# Patient Record
Sex: Female | Born: 1940
Health system: Southern US, Community
[De-identification: ages and names within clinical notes are randomized; demographics above are authoritative.]

## PROBLEM LIST (undated history)

## (undated) DIAGNOSIS — M19071 Primary osteoarthritis, right ankle and foot: Secondary | ICD-10-CM

## (undated) DIAGNOSIS — F329 Major depressive disorder, single episode, unspecified: Secondary | ICD-10-CM

## (undated) DIAGNOSIS — M51369 Other intervertebral disc degeneration, lumbar region without mention of lumbar back pain or lower extremity pain: Secondary | ICD-10-CM

## (undated) DIAGNOSIS — M545 Low back pain, unspecified: Secondary | ICD-10-CM

## (undated) DIAGNOSIS — R000 Tachycardia, unspecified: Secondary | ICD-10-CM

## (undated) DIAGNOSIS — R0600 Dyspnea, unspecified: Secondary | ICD-10-CM

## (undated) DIAGNOSIS — Z98811 Dental restoration status: Secondary | ICD-10-CM

## (undated) DIAGNOSIS — I4510 Unspecified right bundle-branch block: Secondary | ICD-10-CM

## (undated) DIAGNOSIS — Z9181 History of falling: Secondary | ICD-10-CM

## (undated) DIAGNOSIS — T4145XA Adverse effect of unspecified anesthetic, initial encounter: Secondary | ICD-10-CM

## (undated) DIAGNOSIS — G8929 Other chronic pain: Secondary | ICD-10-CM

## (undated) DIAGNOSIS — F419 Anxiety disorder, unspecified: Secondary | ICD-10-CM

## (undated) DIAGNOSIS — E669 Obesity, unspecified: Secondary | ICD-10-CM

## (undated) DIAGNOSIS — M1990 Unspecified osteoarthritis, unspecified site: Secondary | ICD-10-CM

## (undated) DIAGNOSIS — Z9889 Other specified postprocedural states: Secondary | ICD-10-CM

## (undated) DIAGNOSIS — M5136 Other intervertebral disc degeneration, lumbar region: Secondary | ICD-10-CM

## (undated) DIAGNOSIS — R112 Nausea with vomiting, unspecified: Secondary | ICD-10-CM

## (undated) DIAGNOSIS — J42 Unspecified chronic bronchitis: Secondary | ICD-10-CM

## (undated) DIAGNOSIS — K219 Gastro-esophageal reflux disease without esophagitis: Secondary | ICD-10-CM

## (undated) DIAGNOSIS — M19072 Primary osteoarthritis, left ankle and foot: Secondary | ICD-10-CM

## (undated) DIAGNOSIS — E871 Hypo-osmolality and hyponatremia: Secondary | ICD-10-CM

## (undated) DIAGNOSIS — D649 Anemia, unspecified: Secondary | ICD-10-CM

## (undated) DIAGNOSIS — I4891 Unspecified atrial fibrillation: Secondary | ICD-10-CM

## (undated) DIAGNOSIS — M169 Osteoarthritis of hip, unspecified: Secondary | ICD-10-CM

## (undated) DIAGNOSIS — Z96653 Presence of artificial knee joint, bilateral: Secondary | ICD-10-CM

## (undated) DIAGNOSIS — G4700 Insomnia, unspecified: Secondary | ICD-10-CM

## (undated) DIAGNOSIS — M1710 Unilateral primary osteoarthritis, unspecified knee: Secondary | ICD-10-CM

## (undated) DIAGNOSIS — E559 Vitamin D deficiency, unspecified: Secondary | ICD-10-CM

## (undated) DIAGNOSIS — N189 Chronic kidney disease, unspecified: Secondary | ICD-10-CM

## (undated) DIAGNOSIS — I10 Essential (primary) hypertension: Secondary | ICD-10-CM

## (undated) DIAGNOSIS — C50919 Malignant neoplasm of unspecified site of unspecified female breast: Secondary | ICD-10-CM

## (undated) DIAGNOSIS — D62 Acute posthemorrhagic anemia: Secondary | ICD-10-CM

## (undated) DIAGNOSIS — T8859XA Other complications of anesthesia, initial encounter: Secondary | ICD-10-CM

## (undated) DIAGNOSIS — H3530 Unspecified macular degeneration: Secondary | ICD-10-CM

## (undated) DIAGNOSIS — M19042 Primary osteoarthritis, left hand: Secondary | ICD-10-CM

## (undated) DIAGNOSIS — M19041 Primary osteoarthritis, right hand: Secondary | ICD-10-CM

## (undated) DIAGNOSIS — F32A Depression, unspecified: Secondary | ICD-10-CM

## (undated) DIAGNOSIS — M069 Rheumatoid arthritis, unspecified: Secondary | ICD-10-CM

## (undated) DIAGNOSIS — I499 Cardiac arrhythmia, unspecified: Secondary | ICD-10-CM

## (undated) HISTORY — DX: Primary osteoarthritis, left hand: M19.042

## (undated) HISTORY — DX: Unspecified atrial fibrillation: I48.91

## (undated) HISTORY — DX: Primary osteoarthritis, right hand: M19.041

## (undated) HISTORY — PX: ABDOMINAL HYSTERECTOMY: SHX81

## (undated) HISTORY — DX: Unspecified right bundle-branch block: I45.10

## (undated) HISTORY — PX: KNEE ARTHROSCOPY: SHX127

## (undated) HISTORY — PX: TOTAL ABDOMINAL HYSTERECTOMY W/ BILATERAL SALPINGOOPHORECTOMY: SHX83

## (undated) HISTORY — DX: Unspecified osteoarthritis, unspecified site: M19.90

## (undated) HISTORY — PX: JOINT REPLACEMENT: SHX530

## (undated) HISTORY — DX: Chronic kidney disease, unspecified: N18.9

## (undated) HISTORY — DX: Primary osteoarthritis, left ankle and foot: M19.072

## (undated) HISTORY — PX: WISDOM TOOTH EXTRACTION: SHX21

## (undated) HISTORY — PX: EYE SURGERY: SHX253

## (undated) HISTORY — PX: COLONOSCOPY: SHX174

## (undated) HISTORY — DX: Primary osteoarthritis, right ankle and foot: M19.071

---
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
## 1971-09-18 HISTORY — PX: TUBAL LIGATION: SHX77

## 1998-01-28 ENCOUNTER — Ambulatory Visit (HOSPITAL_COMMUNITY): Admission: RE | Admit: 1998-01-28 | Discharge: 1998-01-28 | Payer: Self-pay | Admitting: Internal Medicine

## 1999-05-26 ENCOUNTER — Ambulatory Visit (HOSPITAL_COMMUNITY): Admission: RE | Admit: 1999-05-26 | Discharge: 1999-05-26 | Payer: Self-pay | Admitting: Internal Medicine

## 1999-05-26 ENCOUNTER — Encounter: Payer: Self-pay | Admitting: Internal Medicine

## 2000-12-16 ENCOUNTER — Ambulatory Visit (HOSPITAL_COMMUNITY): Admission: RE | Admit: 2000-12-16 | Discharge: 2000-12-16 | Payer: Self-pay | Admitting: Internal Medicine

## 2000-12-16 ENCOUNTER — Encounter: Payer: Self-pay | Admitting: Internal Medicine

## 2000-12-17 ENCOUNTER — Ambulatory Visit (HOSPITAL_COMMUNITY): Admission: RE | Admit: 2000-12-17 | Discharge: 2000-12-17 | Payer: Self-pay | Admitting: Gastroenterology

## 2000-12-17 ENCOUNTER — Encounter (INDEPENDENT_AMBULATORY_CARE_PROVIDER_SITE_OTHER): Payer: Self-pay | Admitting: *Deleted

## 2000-12-18 ENCOUNTER — Encounter: Admission: RE | Admit: 2000-12-18 | Discharge: 2000-12-18 | Payer: Self-pay | Admitting: Internal Medicine

## 2000-12-18 ENCOUNTER — Encounter: Payer: Self-pay | Admitting: Internal Medicine

## 2002-06-19 ENCOUNTER — Ambulatory Visit (HOSPITAL_COMMUNITY): Admission: RE | Admit: 2002-06-19 | Discharge: 2002-06-19 | Payer: Self-pay | Admitting: Internal Medicine

## 2002-06-19 ENCOUNTER — Encounter: Payer: Self-pay | Admitting: Internal Medicine

## 2003-09-30 ENCOUNTER — Ambulatory Visit (HOSPITAL_COMMUNITY): Admission: RE | Admit: 2003-09-30 | Discharge: 2003-09-30 | Payer: Self-pay | Admitting: Internal Medicine

## 2004-04-21 ENCOUNTER — Other Ambulatory Visit: Admission: RE | Admit: 2004-04-21 | Discharge: 2004-04-21 | Payer: Self-pay | Admitting: Internal Medicine

## 2005-03-02 ENCOUNTER — Ambulatory Visit (HOSPITAL_COMMUNITY): Admission: RE | Admit: 2005-03-02 | Discharge: 2005-03-02 | Payer: Self-pay | Admitting: Internal Medicine

## 2006-06-21 ENCOUNTER — Ambulatory Visit (HOSPITAL_COMMUNITY): Admission: RE | Admit: 2006-06-21 | Discharge: 2006-06-21 | Payer: Self-pay | Admitting: Internal Medicine

## 2007-08-20 ENCOUNTER — Ambulatory Visit (HOSPITAL_COMMUNITY): Admission: RE | Admit: 2007-08-20 | Discharge: 2007-08-20 | Payer: Self-pay | Admitting: Internal Medicine

## 2008-01-29 ENCOUNTER — Encounter: Admission: RE | Admit: 2008-01-29 | Discharge: 2008-01-29 | Payer: Self-pay | Admitting: Orthopedic Surgery

## 2009-01-14 ENCOUNTER — Ambulatory Visit (HOSPITAL_COMMUNITY): Admission: RE | Admit: 2009-01-14 | Discharge: 2009-01-14 | Payer: Self-pay | Admitting: Internal Medicine

## 2010-05-30 ENCOUNTER — Ambulatory Visit (HOSPITAL_COMMUNITY): Admission: RE | Admit: 2010-05-30 | Discharge: 2010-05-30 | Payer: Self-pay | Admitting: Internal Medicine

## 2011-02-02 NOTE — Procedures (Signed)
[place]. [facility]  Patient:    Debra Griffith, Debra Griffith                        MRN: [id number] Proc. Date: 12/17/00 Attending:  Verlin Grills, M.D. CC:         Modesta Messing, M.D.                           Procedure Report  REFERRING PHYSICIAN:  Modesta Messing, M.D.  PROCEDURE:  Screening colonoscopy.  PROCEDURE INDICATION:  Debra Griffith (date of birth [birth year]) is a 70 year old female who is due for her first screening colonoscopy with polypectomy to prevent colon cancer.  I discussed with Debra Griffith the complications associated with colonoscopy and polypectomy, including intestinal bleeding and intestinal perforation.  Debra Griffith has signed the operative permit.  ENDOSCOPIST:  Verlin Grills, M.D.  PREMEDICATION:  Fentanyl 50 mcg, Versed 8 mg.  ENDOSCOPE:  Olympus pediatric colonoscope.  DESCRIPTION OF PROCEDURE:  After obtaining informed consent, the patient was placed in the left lateral decubitus position.  I administered intravenous fentanyl and intravenous Versed to achieve conscious sedation for the procedure.  The patients blood pressure, oxygen saturation, and cardiac rhythm were monitored throughout the procedure and documented in the medical record.  Anal inspection was normal.  Digital rectal exam was normal.  The Olympus pediatric video colonoscope was introduced into the rectum and under direct vision advanced to the cecum, as identified by a normal-appearing ileocecal valve.  Colonic preparation for the exam today was excellent.  Rectum normal.  Sigmoid colon and descending colon normal.  Splenic flexure normal.  Transverse colon normal.  Hepatic flexure normal.  Ascending colon:  In the mid-ascending colon, there is a 1 cm submucosal tumor consistent with a lipoma.  Using the closed biopsy forceps and palpating the submucosal tumor, it demonstrated the pillow sign.  Multiple biopsies  were taken, and I could see visible fat exuding through the biopsy site.  Cecum and ileocecal valve normal.  ASSESSMENT:  Except for the presence of a 1 cm submucosal lipoma in the ascending colon, proctocolonoscopy to the cecum was normal.  There was no endoscopic evidence for the presence of colorectal neoplasia.  RECOMMENDATIONS:  Repeat colonoscopy in approximately 10 years. DD:  12/17/00 TD:  12/17/00 Job: [id number]

## 2011-08-20 ENCOUNTER — Other Ambulatory Visit (HOSPITAL_COMMUNITY): Payer: Self-pay | Admitting: Internal Medicine

## 2011-08-20 DIAGNOSIS — Z1231 Encounter for screening mammogram for malignant neoplasm of breast: Secondary | ICD-10-CM

## 2011-09-24 ENCOUNTER — Ambulatory Visit (HOSPITAL_COMMUNITY)
Admission: RE | Admit: 2011-09-24 | Discharge: 2011-09-24 | Disposition: A | Discharge status: Home / Self Care | Payer: Medicare Other | Source: Ambulatory Visit | Attending: Internal Medicine | Admitting: Internal Medicine

## 2011-09-24 DIAGNOSIS — Z1231 Encounter for screening mammogram for malignant neoplasm of breast: Secondary | ICD-10-CM | POA: Insufficient documentation

## 2011-09-24 DIAGNOSIS — M5137 Other intervertebral disc degeneration, lumbosacral region: Secondary | ICD-10-CM | POA: Diagnosis not present

## 2011-09-24 DIAGNOSIS — M999 Biomechanical lesion, unspecified: Secondary | ICD-10-CM | POA: Diagnosis not present

## 2011-10-02 DIAGNOSIS — I10 Essential (primary) hypertension: Secondary | ICD-10-CM | POA: Diagnosis not present

## 2011-10-02 DIAGNOSIS — E559 Vitamin D deficiency, unspecified: Secondary | ICD-10-CM | POA: Diagnosis not present

## 2011-10-02 DIAGNOSIS — E782 Mixed hyperlipidemia: Secondary | ICD-10-CM | POA: Diagnosis not present

## 2011-10-02 DIAGNOSIS — G4700 Insomnia, unspecified: Secondary | ICD-10-CM | POA: Diagnosis not present

## 2011-10-02 DIAGNOSIS — M1990 Unspecified osteoarthritis, unspecified site: Secondary | ICD-10-CM | POA: Diagnosis not present

## 2011-10-03 DIAGNOSIS — M999 Biomechanical lesion, unspecified: Secondary | ICD-10-CM | POA: Diagnosis not present

## 2011-10-03 DIAGNOSIS — M5137 Other intervertebral disc degeneration, lumbosacral region: Secondary | ICD-10-CM | POA: Diagnosis not present

## 2011-10-18 DIAGNOSIS — M5137 Other intervertebral disc degeneration, lumbosacral region: Secondary | ICD-10-CM | POA: Diagnosis not present

## 2011-10-18 DIAGNOSIS — M999 Biomechanical lesion, unspecified: Secondary | ICD-10-CM | POA: Diagnosis not present

## 2011-10-24 DIAGNOSIS — M999 Biomechanical lesion, unspecified: Secondary | ICD-10-CM | POA: Diagnosis not present

## 2011-10-24 DIAGNOSIS — M5137 Other intervertebral disc degeneration, lumbosacral region: Secondary | ICD-10-CM | POA: Diagnosis not present

## 2011-10-29 DIAGNOSIS — M1710 Unilateral primary osteoarthritis, unspecified knee: Secondary | ICD-10-CM | POA: Diagnosis not present

## 2011-10-31 DIAGNOSIS — M999 Biomechanical lesion, unspecified: Secondary | ICD-10-CM | POA: Diagnosis not present

## 2011-10-31 DIAGNOSIS — M5137 Other intervertebral disc degeneration, lumbosacral region: Secondary | ICD-10-CM | POA: Diagnosis not present

## 2011-11-06 DIAGNOSIS — M5137 Other intervertebral disc degeneration, lumbosacral region: Secondary | ICD-10-CM | POA: Diagnosis not present

## 2011-11-06 DIAGNOSIS — M999 Biomechanical lesion, unspecified: Secondary | ICD-10-CM | POA: Diagnosis not present

## 2011-11-21 DIAGNOSIS — M999 Biomechanical lesion, unspecified: Secondary | ICD-10-CM | POA: Diagnosis not present

## 2011-11-21 DIAGNOSIS — M5137 Other intervertebral disc degeneration, lumbosacral region: Secondary | ICD-10-CM | POA: Diagnosis not present

## 2011-11-24 DIAGNOSIS — M999 Biomechanical lesion, unspecified: Secondary | ICD-10-CM | POA: Diagnosis not present

## 2011-11-24 DIAGNOSIS — M5137 Other intervertebral disc degeneration, lumbosacral region: Secondary | ICD-10-CM | POA: Diagnosis not present

## 2011-11-28 DIAGNOSIS — M5137 Other intervertebral disc degeneration, lumbosacral region: Secondary | ICD-10-CM | POA: Diagnosis not present

## 2011-11-28 DIAGNOSIS — M999 Biomechanical lesion, unspecified: Secondary | ICD-10-CM | POA: Diagnosis not present

## 2011-11-30 DIAGNOSIS — M1710 Unilateral primary osteoarthritis, unspecified knee: Secondary | ICD-10-CM | POA: Diagnosis not present

## 2011-12-05 DIAGNOSIS — M5137 Other intervertebral disc degeneration, lumbosacral region: Secondary | ICD-10-CM | POA: Diagnosis not present

## 2011-12-05 DIAGNOSIS — M999 Biomechanical lesion, unspecified: Secondary | ICD-10-CM | POA: Diagnosis not present

## 2011-12-07 DIAGNOSIS — M1710 Unilateral primary osteoarthritis, unspecified knee: Secondary | ICD-10-CM | POA: Diagnosis not present

## 2011-12-12 DIAGNOSIS — M5137 Other intervertebral disc degeneration, lumbosacral region: Secondary | ICD-10-CM | POA: Diagnosis not present

## 2011-12-12 DIAGNOSIS — M999 Biomechanical lesion, unspecified: Secondary | ICD-10-CM | POA: Diagnosis not present

## 2011-12-17 DIAGNOSIS — M1710 Unilateral primary osteoarthritis, unspecified knee: Secondary | ICD-10-CM | POA: Diagnosis not present

## 2011-12-19 DIAGNOSIS — M5137 Other intervertebral disc degeneration, lumbosacral region: Secondary | ICD-10-CM | POA: Diagnosis not present

## 2011-12-19 DIAGNOSIS — M999 Biomechanical lesion, unspecified: Secondary | ICD-10-CM | POA: Diagnosis not present

## 2011-12-21 DIAGNOSIS — M999 Biomechanical lesion, unspecified: Secondary | ICD-10-CM | POA: Diagnosis not present

## 2011-12-21 DIAGNOSIS — M5137 Other intervertebral disc degeneration, lumbosacral region: Secondary | ICD-10-CM | POA: Diagnosis not present

## 2011-12-26 DIAGNOSIS — M5137 Other intervertebral disc degeneration, lumbosacral region: Secondary | ICD-10-CM | POA: Diagnosis not present

## 2011-12-26 DIAGNOSIS — M999 Biomechanical lesion, unspecified: Secondary | ICD-10-CM | POA: Diagnosis not present

## 2011-12-31 DIAGNOSIS — M5137 Other intervertebral disc degeneration, lumbosacral region: Secondary | ICD-10-CM | POA: Diagnosis not present

## 2011-12-31 DIAGNOSIS — M999 Biomechanical lesion, unspecified: Secondary | ICD-10-CM | POA: Diagnosis not present

## 2012-01-01 DIAGNOSIS — M1990 Unspecified osteoarthritis, unspecified site: Secondary | ICD-10-CM | POA: Diagnosis not present

## 2012-01-01 DIAGNOSIS — G4700 Insomnia, unspecified: Secondary | ICD-10-CM | POA: Diagnosis not present

## 2012-01-01 DIAGNOSIS — I10 Essential (primary) hypertension: Secondary | ICD-10-CM | POA: Diagnosis not present

## 2012-01-01 DIAGNOSIS — E559 Vitamin D deficiency, unspecified: Secondary | ICD-10-CM | POA: Diagnosis not present

## 2012-01-01 DIAGNOSIS — E782 Mixed hyperlipidemia: Secondary | ICD-10-CM | POA: Diagnosis not present

## 2012-01-03 DIAGNOSIS — M999 Biomechanical lesion, unspecified: Secondary | ICD-10-CM | POA: Diagnosis not present

## 2012-01-03 DIAGNOSIS — M5137 Other intervertebral disc degeneration, lumbosacral region: Secondary | ICD-10-CM | POA: Diagnosis not present

## 2012-01-07 DIAGNOSIS — Z1211 Encounter for screening for malignant neoplasm of colon: Secondary | ICD-10-CM | POA: Diagnosis not present

## 2012-01-07 DIAGNOSIS — D175 Benign lipomatous neoplasm of intra-abdominal organs: Secondary | ICD-10-CM | POA: Diagnosis not present

## 2012-01-09 DIAGNOSIS — M5137 Other intervertebral disc degeneration, lumbosacral region: Secondary | ICD-10-CM | POA: Diagnosis not present

## 2012-01-09 DIAGNOSIS — M999 Biomechanical lesion, unspecified: Secondary | ICD-10-CM | POA: Diagnosis not present

## 2012-01-17 DIAGNOSIS — M999 Biomechanical lesion, unspecified: Secondary | ICD-10-CM | POA: Diagnosis not present

## 2012-01-17 DIAGNOSIS — M5137 Other intervertebral disc degeneration, lumbosacral region: Secondary | ICD-10-CM | POA: Diagnosis not present

## 2012-01-21 DIAGNOSIS — M1710 Unilateral primary osteoarthritis, unspecified knee: Secondary | ICD-10-CM | POA: Diagnosis not present

## 2012-01-22 DIAGNOSIS — M999 Biomechanical lesion, unspecified: Secondary | ICD-10-CM | POA: Diagnosis not present

## 2012-01-22 DIAGNOSIS — M5137 Other intervertebral disc degeneration, lumbosacral region: Secondary | ICD-10-CM | POA: Diagnosis not present

## 2012-01-30 DIAGNOSIS — M5137 Other intervertebral disc degeneration, lumbosacral region: Secondary | ICD-10-CM | POA: Diagnosis not present

## 2012-01-30 DIAGNOSIS — M999 Biomechanical lesion, unspecified: Secondary | ICD-10-CM | POA: Diagnosis not present

## 2012-02-06 DIAGNOSIS — M999 Biomechanical lesion, unspecified: Secondary | ICD-10-CM | POA: Diagnosis not present

## 2012-02-06 DIAGNOSIS — M5137 Other intervertebral disc degeneration, lumbosacral region: Secondary | ICD-10-CM | POA: Diagnosis not present

## 2012-02-20 DIAGNOSIS — M5137 Other intervertebral disc degeneration, lumbosacral region: Secondary | ICD-10-CM | POA: Diagnosis not present

## 2012-02-20 DIAGNOSIS — M999 Biomechanical lesion, unspecified: Secondary | ICD-10-CM | POA: Diagnosis not present

## 2012-02-22 DIAGNOSIS — M999 Biomechanical lesion, unspecified: Secondary | ICD-10-CM | POA: Diagnosis not present

## 2012-02-22 DIAGNOSIS — M5137 Other intervertebral disc degeneration, lumbosacral region: Secondary | ICD-10-CM | POA: Diagnosis not present

## 2012-03-05 DIAGNOSIS — M999 Biomechanical lesion, unspecified: Secondary | ICD-10-CM | POA: Diagnosis not present

## 2012-03-05 DIAGNOSIS — M5137 Other intervertebral disc degeneration, lumbosacral region: Secondary | ICD-10-CM | POA: Diagnosis not present

## 2012-03-06 DIAGNOSIS — M1710 Unilateral primary osteoarthritis, unspecified knee: Secondary | ICD-10-CM | POA: Diagnosis not present

## 2012-03-06 DIAGNOSIS — M25559 Pain in unspecified hip: Secondary | ICD-10-CM | POA: Diagnosis not present

## 2012-03-18 DIAGNOSIS — M5137 Other intervertebral disc degeneration, lumbosacral region: Secondary | ICD-10-CM | POA: Diagnosis not present

## 2012-03-18 DIAGNOSIS — M999 Biomechanical lesion, unspecified: Secondary | ICD-10-CM | POA: Diagnosis not present

## 2012-03-19 DIAGNOSIS — M76899 Other specified enthesopathies of unspecified lower limb, excluding foot: Secondary | ICD-10-CM | POA: Diagnosis not present

## 2012-03-24 DIAGNOSIS — M25559 Pain in unspecified hip: Secondary | ICD-10-CM | POA: Diagnosis not present

## 2012-03-25 DIAGNOSIS — M5137 Other intervertebral disc degeneration, lumbosacral region: Secondary | ICD-10-CM | POA: Diagnosis not present

## 2012-03-25 DIAGNOSIS — M25559 Pain in unspecified hip: Secondary | ICD-10-CM | POA: Diagnosis not present

## 2012-03-25 DIAGNOSIS — M999 Biomechanical lesion, unspecified: Secondary | ICD-10-CM | POA: Diagnosis not present

## 2012-04-14 DIAGNOSIS — M25559 Pain in unspecified hip: Secondary | ICD-10-CM | POA: Diagnosis not present

## 2012-04-17 DIAGNOSIS — M1710 Unilateral primary osteoarthritis, unspecified knee: Secondary | ICD-10-CM | POA: Diagnosis not present

## 2012-04-30 DIAGNOSIS — M25559 Pain in unspecified hip: Secondary | ICD-10-CM | POA: Diagnosis not present

## 2012-05-30 DIAGNOSIS — I10 Essential (primary) hypertension: Secondary | ICD-10-CM | POA: Diagnosis not present

## 2012-05-30 DIAGNOSIS — G4700 Insomnia, unspecified: Secondary | ICD-10-CM | POA: Diagnosis not present

## 2012-05-30 DIAGNOSIS — J209 Acute bronchitis, unspecified: Secondary | ICD-10-CM | POA: Diagnosis not present

## 2012-05-30 DIAGNOSIS — E782 Mixed hyperlipidemia: Secondary | ICD-10-CM | POA: Diagnosis not present

## 2012-05-30 DIAGNOSIS — E559 Vitamin D deficiency, unspecified: Secondary | ICD-10-CM | POA: Diagnosis not present

## 2012-05-30 DIAGNOSIS — M1990 Unspecified osteoarthritis, unspecified site: Secondary | ICD-10-CM | POA: Diagnosis not present

## 2012-06-13 ENCOUNTER — Encounter (HOSPITAL_COMMUNITY): Payer: Self-pay | Admitting: Pharmacy Technician

## 2012-06-17 NOTE — Patient Instructions (Signed)
20 Debra Griffith  06/17/2012   Your procedure is scheduled on:  06/24/12 1230pm-215pm  Report to [facility] at 1000 AM.  Call this number if you have problems the morning of surgery: [phone]   Remember:   Do not eat food:After Midnight.  May have clear liquids:until Midnight .    Take these medicines the morning of surgery with A SIP OF WATER:    Do not wear jewelry, make-up or nail polish.  Do not wear lotions, powders, or perfumes.  Do not shave 48 hours prior to surgery.  Do not bring valuables to the hospital.  Contacts, dentures or bridgework may not be worn into surgery.  Leave suitcase in the car. After surgery it may be brought to your room.  For patients admitted to the hospital, checkout time is 11:00 AM the day of discharge.                SEE CHG INSTRUCTION SHEET    Please read over the following fact sheets that you were given: MRSA Information, coughing and deep breathing exercises, leg exercises , Blood Transfusion Fact Sheet

## 2012-06-18 ENCOUNTER — Other Ambulatory Visit: Payer: Self-pay | Admitting: Orthopedic Surgery

## 2012-06-18 ENCOUNTER — Other Ambulatory Visit: Payer: Self-pay

## 2012-06-18 ENCOUNTER — Encounter (HOSPITAL_COMMUNITY)
Admission: RE | Admit: 2012-06-18 | Discharge: 2012-06-18 | Disposition: A | Discharge status: Home / Self Care | Payer: Medicare Other | Source: Ambulatory Visit | Attending: Orthopedic Surgery | Admitting: Orthopedic Surgery

## 2012-06-18 ENCOUNTER — Encounter (HOSPITAL_COMMUNITY): Payer: Self-pay

## 2012-06-18 ENCOUNTER — Ambulatory Visit (HOSPITAL_COMMUNITY)
Admission: RE | Admit: 2012-06-18 | Discharge: 2012-06-18 | Disposition: A | Discharge status: Home / Self Care | Payer: Medicare Other | Source: Ambulatory Visit | Attending: Orthopedic Surgery | Admitting: Orthopedic Surgery

## 2012-06-18 DIAGNOSIS — I10 Essential (primary) hypertension: Secondary | ICD-10-CM | POA: Insufficient documentation

## 2012-06-18 DIAGNOSIS — M1610 Unilateral primary osteoarthritis, unspecified hip: Secondary | ICD-10-CM | POA: Insufficient documentation

## 2012-06-18 DIAGNOSIS — M169 Osteoarthritis of hip, unspecified: Secondary | ICD-10-CM | POA: Insufficient documentation

## 2012-06-18 DIAGNOSIS — Z01812 Encounter for preprocedural laboratory examination: Secondary | ICD-10-CM | POA: Diagnosis not present

## 2012-06-18 DIAGNOSIS — Z01810 Encounter for preprocedural cardiovascular examination: Secondary | ICD-10-CM | POA: Insufficient documentation

## 2012-06-18 DIAGNOSIS — K219 Gastro-esophageal reflux disease without esophagitis: Secondary | ICD-10-CM | POA: Diagnosis not present

## 2012-06-18 DIAGNOSIS — Z01818 Encounter for other preprocedural examination: Secondary | ICD-10-CM | POA: Diagnosis not present

## 2012-06-18 HISTORY — DX: Other complications of anesthesia, initial encounter: T88.59XA

## 2012-06-18 HISTORY — DX: Anemia, unspecified: D64.9

## 2012-06-18 HISTORY — DX: Gastro-esophageal reflux disease without esophagitis: K21.9

## 2012-06-18 HISTORY — DX: Anxiety disorder, unspecified: F41.9

## 2012-06-18 HISTORY — DX: Essential (primary) hypertension: I10

## 2012-06-18 HISTORY — DX: Adverse effect of unspecified anesthetic, initial encounter: T41.45XA

## 2012-06-18 HISTORY — DX: Nausea with vomiting, unspecified: R11.2

## 2012-06-18 HISTORY — DX: Other specified postprocedural states: Z98.890

## 2012-06-18 LAB — COMPREHENSIVE METABOLIC PANEL
ALT: 15 U/L (ref 0–35)
Alkaline Phosphatase: 73 U/L (ref 39–117)
BUN: 18 mg/dL (ref 6–23)
Chloride: 98 mEq/L (ref 96–112)
GFR calc Af Amer: 65 mL/min — ABNORMAL LOW (ref >90)
Glucose, Bld: 100 mg/dL — ABNORMAL HIGH (ref 70–99)
Potassium: 3.7 mEq/L (ref 3.5–5.1)
Sodium: 135 mEq/L (ref 135–145)
Total Bilirubin: 0.2 mg/dL — ABNORMAL LOW (ref 0.3–1.2)

## 2012-06-18 LAB — CBC
MCHC: 33.5 g/dL (ref 30.0–36.0)
RBC: 4.12 MIL/uL (ref 3.87–5.11)
RDW: 13.8 % (ref 11.5–15.5)

## 2012-06-18 LAB — APTT: aPTT: 27 seconds (ref 24–37)

## 2012-06-18 LAB — URINALYSIS, ROUTINE W REFLEX MICROSCOPIC
Bilirubin Urine: NEGATIVE
Glucose, UA: NEGATIVE mg/dL
Ketones, ur: NEGATIVE mg/dL
Nitrite: NEGATIVE
Protein, ur: NEGATIVE mg/dL
pH: 6.5 (ref 5.0–8.0)

## 2012-06-18 LAB — SURGICAL PCR SCREEN
MRSA, PCR: NEGATIVE
Staphylococcus aureus: NEGATIVE

## 2012-06-18 LAB — URINE MICROSCOPIC-ADD ON

## 2012-06-18 MED ORDER — BUPIVACAINE LIPOSOME 1.3 % IJ SUSP: 20.0000 mL | Freq: Once | INTRAMUSCULAR | Status: DC

## 2012-06-18 MED ORDER — DEXAMETHASONE SODIUM PHOSPHATE 10 MG/ML IJ SOLN: 10.0000 mg | Freq: Once | INTRAMUSCULAR | Status: DC

## 2012-06-18 NOTE — Progress Notes (Signed)
Abnormal urinalysis and micro results faxed via EPIC to Dr Aluisio.  

## 2012-06-18 NOTE — Progress Notes (Signed)
Requested Last office visit note, ekg, stress and echo from [facility].

## 2012-06-18 NOTE — Progress Notes (Signed)
Preoperative surgical orders have been place into the Epic hospital system for Debra Griffith on 06/18/2012, 1:13 PM  by Patrica Duel for surgery on 06/24/12.  Preop Total Hip orders including Experel Injecion, IV Tylenol, and IV Decadron as long as there are no contraindications to the above medications. Avel Peace, PA-C

## 2012-06-18 NOTE — Progress Notes (Signed)
From 1993- on chart are the following discharge Summary of 07/30/1992, Anesthesia Record 07/1992, and Operative Report.   05/30/12 DR Marden Noble office visit note on chart and note form 01/01/12 on chart

## 2012-06-20 NOTE — Progress Notes (Signed)
Dr Council Mechanic made aware of abnormal EKG along with EKG from 2005.  Per Dr Council Mechanic will evaluate patient day of surgery.

## 2012-06-20 NOTE — Progress Notes (Signed)
Rerequested office visit note and most recent ekg from cardiology.

## 2012-06-23 ENCOUNTER — Other Ambulatory Visit: Payer: Self-pay | Admitting: Orthopedic Surgery

## 2012-06-23 NOTE — Progress Notes (Signed)
Please note that Anesthesia Record from [facility] received from Medical Records Department twice had no name and date of birth on anesthesia record.  Medical Records stated the page before and after have the patient's name and date of birth .  Both records are on the chart.

## 2012-06-23 NOTE — Progress Notes (Signed)
Received Stress Test from 2005 and placed on chart.

## 2012-06-23 NOTE — H&P (Signed)
Debra Griffith  DOB: 04/06/1941 Married / Language: English / Race: White Female  Date of Admission:  06/24/2012  Chief Complaint:  Left Hip Pain  History of Present Illness The patient is a 71 year old female who comes in for a preoperative History and Physical. The patient is scheduled for a left total hip arthroplasty to be performed by Dr. Frank V. Aluisio, MD at [facility] on 06/24/2012. The patient is a 70 year old female who presents for follow up of their hip. The patient is being followed for their left hip pain. Symptoms reported today include: instability and pain with weightbearing (unable to walk). The patient feels that they are doing poorly and report their pain level to be 10 / 10. The following medication has been used for pain control: Hydrocodone. Unfortunately, her pain was getting progressively worse. She had that MRI showing some collapse of the femoral head with a subchondral stress fracture. Since then she's gotten worse. She is essentially in a wheelchair most of the time now. She definitely does better at rest than she does with any activity. She has recently tried a left hip intra-articular steroid injection. It helped well but only for a few days. The shot wore off and the pain came back.  It is of note that she had a fall recently at home on Tuesday night, early in the morning on Wednesday around 2 am. She was seen at the hospital where she had xrays of ther hips and she was told no fractures. She also hit her head during the fall. She had a dull headache into the afternoon that same day, Wednesday, but it resolved and has no complaints of headache, blurred vision, double vision since the fall. They have been treated conservatively in the past for the above stated problem and despite conservative measures, they continue to have progressive pain and severe functional limitations and dysfunction. They have failed non-operative management including  home exercise, medications, and injections. It is felt that they would benefit from undergoing total joint replacement. Risks and benefits of the procedure have been discussed with the patient and they elect to proceed with surgery. There are no active contraindications to surgery such as ongoing infection or rapidly progressive neurological disease.   Problem List/Past Medical Lumbar/Lumbosacral Disc Degeneration (722.52) Chronic pain syndrome (338.4) Osteoarthritis, Hip (715.35) Lumbago (724.2). 02/05/2011 Anxiety Disorder High blood pressure Obesity, Morbid Hypercholesterolemia Depression Insomnia Edema. Lower Extremity Mild Reactive Airway Disease Vitamin D Deficiency Bronchitis Urinary Tract Infection. Past History   Allergies No Known Drug Allergies. 05/08/2011   Family History Congestive Heart Failure. father Chronic Obstructive Lung Disease. father Hypertension. mother, father and sister Heart Disease. father Cerebrovascular Accident. father Cancer. father Osteoporosis. mother Osteoarthritis. mother and sister   Social History Illicit drug use. no Exercise. Exercises weekly Marital status. married Living situation. live with spouse Current work status. retired Children. 3 Drug/Alcohol Rehab (Previously). no Drug/Alcohol Rehab (Currently). no Number of flights of stairs before winded. 4-5 Tobacco / smoke exposure. no Pain Contract. yes Tobacco use. Never smoker. never smoker Alcohol use. never consumed alcohol No alcohol use Post-Surgical Plans. Plan is to go to [name]. Advance Directives. Living Will   Medication History Multi-Vitamin ( Oral) Active. Glucosamine Chondroitin ( Oral) Active. Calcium Carbonate (600MG Tablet, Oral) Active. Vitamin D (2000UNIT Capsule, Oral) Active. Maxzide-25 (37.5-25MG Tablet, Oral) Active. Norco (5-325MG Tablet, Oral) Active. Lasix (40MG Tablet, Oral) Active. Klor-Con M20 (20MEQ  Tablet ER, Oral) Active. Aleve (220MG Capsule, Oral)   Active. Ranitidine 75 (75MG Tablet, Oral) Active. Exforge (10-320MG Tablet, Oral) Active. Cyclobenzaprine HCl (10MG Tablet, Oral) Active. Metoprolol Tartrate (25MG Tablet, Oral) Active. KlonoPIN (1MG Tablet, Oral) Active. Ambien (10MG Tablet, Oral) Active. Topiramate (25MG Tablet, Oral) Active. Phentermine HCl (15MG Capsule, Oral) Active. Aspirin Childrens (81MG Tablet Chewable, Oral) Active.   Past Surgical History Hysterectomy. complete (non-cancerous) Cesarean Delivery. 3 or more times Arthroscopic Knee Surgery - Right   Review of Systems General:Not Present- Chills, Fever, Night Sweats, Fatigue, Weight Gain, Weight Loss and Memory Loss. Skin:Not Present- Hives, Itching, Rash, Eczema and Lesions. HEENT:Not Present- Tinnitus, Headache, Double Vision, Visual Loss, Hearing Loss and Dentures. Respiratory:Not Present- Shortness of breath with exertion, Shortness of breath at rest, Allergies, Coughing up blood and Chronic Cough. Cardiovascular:Not Present- Chest Pain, Racing/skipping heartbeats, Difficulty Breathing Lying Down, Murmur, Swelling and Palpitations. Gastrointestinal:Present- Constipation. Not Present- Bloody Stool, Heartburn, Abdominal Pain, Vomiting, Nausea, Diarrhea, Difficulty Swallowing, Jaundice and Loss of appetitie. Female Genitourinary:Not Present- Blood in Urine, Urinary frequency, Weak urinary stream, Discharge, Flank Pain, Incontinence, Painful Urination, Urgency, Urinary Retention and Urinating at Night. Musculoskeletal:Present- Joint Pain and Back Pain. Not Present- Muscle Weakness, Muscle Pain, Joint Swelling, Morning Stiffness and Spasms. Neurological:Not Present- Tremor, Dizziness, Blackout spells, Paralysis, Difficulty with balance and Weakness. Psychiatric:Not Present- Insomnia.   Vitals Weight: 219 lb Height: 60 in. Body Surface Area: 2.05 m Body Mass Index: 42.77 kg/m Pulse: 76  (Regular) Resp.: 14 (Unlabored) BP: 128/78 (Sitting, Right Arm, Standard)   Physical Exam The physical exam findings are as follows:  Note: Pateint is a 70 year old female with continued hip pain. Patient is accompanied today by her husband Debra Griffith.   General Mental Status - Alert, cooperative and good historian. General Appearance- pleasant. Not in acute distress. Orientation- Oriented X3. Build & Nutrition- Well nourished and Well developed.   Head and Neck HeadNote: Normocephalic but she does have a small superficial abrasion on the posterior right lateral occpital region of her head beneath the hair form when she had a fall recently at home. Neck Global Assessment- supple. no bruit auscultated on the right and no bruit auscultated on the left.   Eye Pupil- Bilateral- Regular and Round. Motion- Bilateral- EOMI. reading glasses  Chest and Lung Exam Auscultation: Breath sounds:- clear at anterior chest wall and - clear at posterior chest wall. Adventitious sounds:- No Adventitious sounds.   Cardiovascular Auscultation:Rhythm- Regular rate and rhythm. Heart Sounds- S1 WNL and S2 WNL. Murmurs & Other Heart Sounds:Auscultation of the heart reveals - No Murmurs.   Abdomen Inspection:Contour- Generalized moderate distention. Palpation/Percussion:Tenderness- Abdomen is non-tender to palpation. Rigidity (guarding)- Abdomen is soft. Auscultation:Auscultation of the abdomen reveals - Bowel sounds normal.   Female Genitourinary Not done, not pertinent to present illness  Musculoskeletal  On exam, well-developed female, in no apparent distress. ROM of the right hip is flexion about 90, minimal rotation, about 20 abduction.  RADIOGRAPHS: X-rays today, AP pelvis and lateral of the hip. She has bone on bone arthritis of the left hip with loss of the joint space. No obvious collapse of the femoral head.  Assessment & Plan Osteoarthritis,  Hip (715.35) Impression: Left Hip  Note: Patient is for a left total hip replaement by Dr. Aluisio.  Plan is to go to [name].  Patient states that she has had problems with anesthesia in the past.  PCP - Dr. Neville Gates  Signed electronically by DREW L Whitt Auletta, PA-C  

## 2012-06-24 ENCOUNTER — Inpatient Hospital Stay (HOSPITAL_COMMUNITY): Payer: Medicare Other

## 2012-06-24 ENCOUNTER — Encounter (HOSPITAL_COMMUNITY): Payer: Self-pay | Admitting: *Deleted

## 2012-06-24 ENCOUNTER — Encounter (HOSPITAL_COMMUNITY)
Admission: RE | Disposition: A | Discharge status: Skilled Nursing Facility | Payer: Self-pay | Source: Ambulatory Visit | Attending: Orthopedic Surgery

## 2012-06-24 ENCOUNTER — Encounter (HOSPITAL_COMMUNITY): Payer: Self-pay | Admitting: Anesthesiology

## 2012-06-24 ENCOUNTER — Inpatient Hospital Stay (HOSPITAL_COMMUNITY): Payer: Medicare Other | Admitting: Anesthesiology

## 2012-06-24 ENCOUNTER — Inpatient Hospital Stay (HOSPITAL_COMMUNITY)
Admission: RE | Admit: 2012-06-24 | Discharge: 2012-06-27 | DRG: 470 | Disposition: A | Discharge status: Skilled Nursing Facility | Payer: Medicare Other | Source: Ambulatory Visit | Attending: Orthopedic Surgery | Admitting: Orthopedic Surgery

## 2012-06-24 DIAGNOSIS — G4700 Insomnia, unspecified: Secondary | ICD-10-CM | POA: Diagnosis present

## 2012-06-24 DIAGNOSIS — Z90710 Acquired absence of both cervix and uterus: Secondary | ICD-10-CM | POA: Diagnosis not present

## 2012-06-24 DIAGNOSIS — Z96649 Presence of unspecified artificial hip joint: Secondary | ICD-10-CM

## 2012-06-24 DIAGNOSIS — F329 Major depressive disorder, single episode, unspecified: Secondary | ICD-10-CM | POA: Diagnosis present

## 2012-06-24 DIAGNOSIS — Z471 Aftercare following joint replacement surgery: Secondary | ICD-10-CM | POA: Diagnosis not present

## 2012-06-24 DIAGNOSIS — M169 Osteoarthritis of hip, unspecified: Secondary | ICD-10-CM

## 2012-06-24 DIAGNOSIS — Z5189 Encounter for other specified aftercare: Secondary | ICD-10-CM | POA: Diagnosis not present

## 2012-06-24 DIAGNOSIS — G894 Chronic pain syndrome: Secondary | ICD-10-CM | POA: Diagnosis present

## 2012-06-24 DIAGNOSIS — N189 Chronic kidney disease, unspecified: Secondary | ICD-10-CM | POA: Diagnosis not present

## 2012-06-24 DIAGNOSIS — Z6841 Body Mass Index (BMI) 40.0 and over, adult: Secondary | ICD-10-CM

## 2012-06-24 DIAGNOSIS — F3289 Other specified depressive episodes: Secondary | ICD-10-CM | POA: Diagnosis present

## 2012-06-24 DIAGNOSIS — Z87440 Personal history of urinary (tract) infections: Secondary | ICD-10-CM

## 2012-06-24 DIAGNOSIS — I498 Other specified cardiac arrhythmias: Secondary | ICD-10-CM | POA: Diagnosis not present

## 2012-06-24 DIAGNOSIS — I129 Hypertensive chronic kidney disease with stage 1 through stage 4 chronic kidney disease, or unspecified chronic kidney disease: Secondary | ICD-10-CM | POA: Diagnosis present

## 2012-06-24 DIAGNOSIS — K2100 Gastro-esophageal reflux disease with esophagitis, without bleeding: Secondary | ICD-10-CM | POA: Diagnosis not present

## 2012-06-24 DIAGNOSIS — J45909 Unspecified asthma, uncomplicated: Secondary | ICD-10-CM | POA: Diagnosis present

## 2012-06-24 DIAGNOSIS — Z79899 Other long term (current) drug therapy: Secondary | ICD-10-CM

## 2012-06-24 DIAGNOSIS — M5137 Other intervertebral disc degeneration, lumbosacral region: Secondary | ICD-10-CM | POA: Diagnosis present

## 2012-06-24 DIAGNOSIS — E7800 Pure hypercholesterolemia, unspecified: Secondary | ICD-10-CM | POA: Diagnosis present

## 2012-06-24 DIAGNOSIS — M51379 Other intervertebral disc degeneration, lumbosacral region without mention of lumbar back pain or lower extremity pain: Secondary | ICD-10-CM | POA: Diagnosis present

## 2012-06-24 DIAGNOSIS — K219 Gastro-esophageal reflux disease without esophagitis: Secondary | ICD-10-CM | POA: Diagnosis present

## 2012-06-24 DIAGNOSIS — E559 Vitamin D deficiency, unspecified: Secondary | ICD-10-CM | POA: Diagnosis present

## 2012-06-24 DIAGNOSIS — I10 Essential (primary) hypertension: Secondary | ICD-10-CM | POA: Diagnosis not present

## 2012-06-24 DIAGNOSIS — F411 Generalized anxiety disorder: Secondary | ICD-10-CM | POA: Diagnosis present

## 2012-06-24 DIAGNOSIS — E871 Hypo-osmolality and hyponatremia: Secondary | ICD-10-CM | POA: Diagnosis not present

## 2012-06-24 DIAGNOSIS — Z7982 Long term (current) use of aspirin: Secondary | ICD-10-CM | POA: Diagnosis not present

## 2012-06-24 DIAGNOSIS — D62 Acute posthemorrhagic anemia: Secondary | ICD-10-CM | POA: Diagnosis not present

## 2012-06-24 DIAGNOSIS — R000 Tachycardia, unspecified: Secondary | ICD-10-CM | POA: Diagnosis not present

## 2012-06-24 DIAGNOSIS — S79919A Unspecified injury of unspecified hip, initial encounter: Secondary | ICD-10-CM | POA: Diagnosis not present

## 2012-06-24 DIAGNOSIS — M1610 Unilateral primary osteoarthritis, unspecified hip: Secondary | ICD-10-CM | POA: Diagnosis not present

## 2012-06-24 DIAGNOSIS — M1990 Unspecified osteoarthritis, unspecified site: Secondary | ICD-10-CM | POA: Diagnosis not present

## 2012-06-24 DIAGNOSIS — M25559 Pain in unspecified hip: Secondary | ICD-10-CM | POA: Diagnosis not present

## 2012-06-24 HISTORY — DX: Osteoarthritis of hip, unspecified: M16.9

## 2012-06-24 HISTORY — PX: TOTAL HIP ARTHROPLASTY: SHX124

## 2012-06-24 LAB — TYPE AND SCREEN: Antibody Screen: NEGATIVE

## 2012-06-24 SURGERY — ARTHROPLASTY, HIP, TOTAL,POSTERIOR APPROACH
Anesthesia: General | Site: Hip | Laterality: Left | Wound class: Clean

## 2012-06-24 MED ORDER — METOCLOPRAMIDE HCL 10 MG PO TABS: 5.0000 mg | ORAL_TABLET | Freq: Three times a day (TID) | ORAL | Status: DC | PRN

## 2012-06-24 MED ORDER — LIDOCAINE HCL (CARDIAC) 20 MG/ML IV SOLN
INTRAVENOUS | Status: DC | PRN
  Administered 2012-06-24: 50 mg via INTRAVENOUS

## 2012-06-24 MED ORDER — SUCCINYLCHOLINE CHLORIDE 20 MG/ML IJ SOLN
INTRAMUSCULAR | Status: DC | PRN
  Administered 2012-06-24: 100 mg via INTRAVENOUS

## 2012-06-24 MED ORDER — MIDAZOLAM HCL 5 MG/5ML IJ SOLN
INTRAMUSCULAR | Status: DC | PRN
  Administered 2012-06-24: 2 mg via INTRAVENOUS

## 2012-06-24 MED ORDER — BISACODYL 10 MG RE SUPP: 10.0000 mg | Freq: Every day | RECTAL | Status: DC | PRN

## 2012-06-24 MED ORDER — AMLODIPINE BESYLATE 10 MG PO TABS
10.0000 mg | ORAL_TABLET | Freq: Every day | ORAL | Status: DC
  Administered 2012-06-25 – 2012-06-27 (×2): 10 mg via ORAL
  Filled 2012-06-24 (×3): qty 1

## 2012-06-24 MED ORDER — METHOCARBAMOL 100 MG/ML IJ SOLN
500.0000 mg | Freq: Four times a day (QID) | INTRAVENOUS | Status: DC | PRN
  Administered 2012-06-24: 500 mg via INTRAVENOUS
  Filled 2012-06-24: qty 5

## 2012-06-24 MED ORDER — SCOPOLAMINE 1 MG/3DAYS TD PT72
MEDICATED_PATCH | TRANSDERMAL | Status: DC | PRN
  Administered 2012-06-24: 1.5 mg via TRANSDERMAL

## 2012-06-24 MED ORDER — TRIAMTERENE-HCTZ 37.5-25 MG PO TABS
1.0000 | ORAL_TABLET | Freq: Every day | ORAL | Status: DC
  Administered 2012-06-25 – 2012-06-27 (×3): 1 via ORAL
  Filled 2012-06-24 (×3): qty 1

## 2012-06-24 MED ORDER — ZOLPIDEM TARTRATE 10 MG PO TABS
10.0000 mg | ORAL_TABLET | Freq: Every day | ORAL | Status: DC
  Administered 2012-06-25 – 2012-06-26 (×2): 10 mg via ORAL
  Filled 2012-06-24 (×3): qty 1

## 2012-06-24 MED ORDER — METOCLOPRAMIDE HCL 5 MG/ML IJ SOLN: 5.0000 mg | Freq: Three times a day (TID) | INTRAMUSCULAR | Status: DC | PRN

## 2012-06-24 MED ORDER — DOCUSATE SODIUM 100 MG PO CAPS
100.0000 mg | ORAL_CAPSULE | Freq: Two times a day (BID) | ORAL | Status: DC
  Administered 2012-06-24 – 2012-06-27 (×6): 100 mg via ORAL

## 2012-06-24 MED ORDER — DEXAMETHASONE SODIUM PHOSPHATE 10 MG/ML IJ SOLN
INTRAMUSCULAR | Status: DC | PRN
  Administered 2012-06-24: 10 mg via INTRAVENOUS

## 2012-06-24 MED ORDER — ONDANSETRON HCL 4 MG/2ML IJ SOLN: 4.0000 mg | Freq: Four times a day (QID) | INTRAMUSCULAR | Status: DC | PRN

## 2012-06-24 MED ORDER — AMLODIPINE BESYLATE-VALSARTAN 10-320 MG PO TABS: 1.0000 | ORAL_TABLET | Freq: Every day | ORAL | Status: DC

## 2012-06-24 MED ORDER — IRBESARTAN 300 MG PO TABS
300.0000 mg | ORAL_TABLET | Freq: Every day | ORAL | Status: DC
  Administered 2012-06-25 – 2012-06-27 (×3): 300 mg via ORAL
  Filled 2012-06-24 (×3): qty 1

## 2012-06-24 MED ORDER — TRAMADOL HCL 50 MG PO TABS: 50.0000 mg | ORAL_TABLET | Freq: Four times a day (QID) | ORAL | Status: DC | PRN

## 2012-06-24 MED ORDER — RIVAROXABAN 10 MG PO TABS
10.0000 mg | ORAL_TABLET | Freq: Every day | ORAL | Status: DC
  Administered 2012-06-25 – 2012-06-27 (×3): 10 mg via ORAL
  Filled 2012-06-24 (×5): qty 1

## 2012-06-24 MED ORDER — ROCURONIUM BROMIDE 100 MG/10ML IV SOLN
INTRAVENOUS | Status: DC | PRN
  Administered 2012-06-24: 30 mg via INTRAVENOUS

## 2012-06-24 MED ORDER — METHOCARBAMOL 500 MG PO TABS
500.0000 mg | ORAL_TABLET | Freq: Four times a day (QID) | ORAL | Status: DC | PRN
  Administered 2012-06-25 – 2012-06-27 (×4): 500 mg via ORAL
  Filled 2012-06-24 (×4): qty 1

## 2012-06-24 MED ORDER — POTASSIUM CHLORIDE CRYS ER 20 MEQ PO TBCR
20.0000 meq | EXTENDED_RELEASE_TABLET | Freq: Two times a day (BID) | ORAL | Status: DC
  Administered 2012-06-24 – 2012-06-27 (×6): 20 meq via ORAL
  Filled 2012-06-24 (×7): qty 1

## 2012-06-24 MED ORDER — POTASSIUM CHLORIDE IN NACL 20-0.9 MEQ/L-% IV SOLN
INTRAVENOUS | Status: DC
  Administered 2012-06-24: 17:00:00 via INTRAVENOUS
  Administered 2012-06-25: 20 mL via INTRAVENOUS
  Filled 2012-06-24 (×2): qty 1000

## 2012-06-24 MED ORDER — ACETAMINOPHEN 325 MG PO TABS: 650.0000 mg | ORAL_TABLET | Freq: Four times a day (QID) | ORAL | Status: DC | PRN

## 2012-06-24 MED ORDER — BUPIVACAINE LIPOSOME 1.3 % IJ SUSP
20.0000 mL | Freq: Once | INTRAMUSCULAR | Status: AC
  Administered 2012-06-24: 20 mL
  Filled 2012-06-24: qty 20

## 2012-06-24 MED ORDER — PHENOL 1.4 % MT LIQD
1.0000 | OROMUCOSAL | Status: DC | PRN
  Filled 2012-06-24: qty 177

## 2012-06-24 MED ORDER — OXYCODONE HCL 5 MG PO TABS
5.0000 mg | ORAL_TABLET | ORAL | Status: DC | PRN
  Administered 2012-06-25 (×2): 10 mg via ORAL
  Administered 2012-06-25: 5 mg via ORAL
  Administered 2012-06-25 – 2012-06-27 (×11): 10 mg via ORAL
  Filled 2012-06-24 (×10): qty 2
  Filled 2012-06-24: qty 1
  Filled 2012-06-24 (×3): qty 2

## 2012-06-24 MED ORDER — METOPROLOL TARTRATE 50 MG PO TABS
50.0000 mg | ORAL_TABLET | Freq: Every day | ORAL | Status: DC
  Administered 2012-06-24 – 2012-06-26 (×3): 50 mg via ORAL
  Filled 2012-06-24 (×4): qty 1

## 2012-06-24 MED ORDER — AMLODIPINE BESYLATE 10 MG PO TABS
10.0000 mg | ORAL_TABLET | Freq: Once | ORAL | Status: AC
  Administered 2012-06-24: 10 mg via ORAL
  Filled 2012-06-24: qty 1

## 2012-06-24 MED ORDER — CEFAZOLIN SODIUM-DEXTROSE 2-3 GM-% IV SOLR
2.0000 g | INTRAVENOUS | Status: AC
  Administered 2012-06-24: 2 g via INTRAVENOUS

## 2012-06-24 MED ORDER — FENTANYL CITRATE 0.05 MG/ML IJ SOLN
INTRAMUSCULAR | Status: DC | PRN
  Administered 2012-06-24: 100 ug via INTRAVENOUS
  Administered 2012-06-24: 50 ug via INTRAVENOUS
  Administered 2012-06-24: 100 ug via INTRAVENOUS
  Administered 2012-06-24 (×2): 50 ug via INTRAVENOUS

## 2012-06-24 MED ORDER — MENTHOL 3 MG MT LOZG
1.0000 | LOZENGE | OROMUCOSAL | Status: DC | PRN
  Filled 2012-06-24: qty 9

## 2012-06-24 MED ORDER — SODIUM CHLORIDE 0.9 % IV SOLN: INTRAVENOUS | Status: DC

## 2012-06-24 MED ORDER — DIPHENHYDRAMINE HCL 12.5 MG/5ML PO ELIX: 12.5000 mg | ORAL_SOLUTION | ORAL | Status: DC | PRN

## 2012-06-24 MED ORDER — ACETAMINOPHEN 10 MG/ML IV SOLN
1000.0000 mg | Freq: Once | INTRAVENOUS | Status: AC
  Administered 2012-06-24: 1000 mg via INTRAVENOUS

## 2012-06-24 MED ORDER — PROMETHAZINE HCL 25 MG/ML IJ SOLN: 6.2500 mg | INTRAMUSCULAR | Status: DC | PRN

## 2012-06-24 MED ORDER — NEOSTIGMINE METHYLSULFATE 1 MG/ML IJ SOLN
INTRAMUSCULAR | Status: DC | PRN
  Administered 2012-06-24: 3 mg via INTRAVENOUS

## 2012-06-24 MED ORDER — TOPIRAMATE 25 MG PO CPSP: 50.0000 mg | ORAL_CAPSULE | Freq: Every day | ORAL | Status: DC

## 2012-06-24 MED ORDER — 0.9 % SODIUM CHLORIDE (POUR BTL) OPTIME
TOPICAL | Status: DC | PRN
  Administered 2012-06-24: 1000 mL

## 2012-06-24 MED ORDER — ACETAMINOPHEN 650 MG RE SUPP: 650.0000 mg | Freq: Four times a day (QID) | RECTAL | Status: DC | PRN

## 2012-06-24 MED ORDER — HYDROMORPHONE HCL PF 1 MG/ML IJ SOLN
INTRAMUSCULAR | Status: DC | PRN
  Administered 2012-06-24: 1 mg via INTRAVENOUS

## 2012-06-24 MED ORDER — SODIUM CHLORIDE 0.9 % IJ SOLN
INTRAMUSCULAR | Status: DC | PRN
  Administered 2012-06-24: 50 mL via INTRAVENOUS

## 2012-06-24 MED ORDER — CHLORHEXIDINE GLUCONATE 4 % EX LIQD
60.0000 mL | Freq: Once | CUTANEOUS | Status: DC
  Filled 2012-06-24: qty 60

## 2012-06-24 MED ORDER — ALBUTEROL SULFATE HFA 108 (90 BASE) MCG/ACT IN AERS
2.0000 | INHALATION_SPRAY | Freq: Four times a day (QID) | RESPIRATORY_TRACT | Status: DC | PRN
  Filled 2012-06-24: qty 6.7

## 2012-06-24 MED ORDER — ONDANSETRON HCL 4 MG PO TABS: 4.0000 mg | ORAL_TABLET | Freq: Four times a day (QID) | ORAL | Status: DC | PRN

## 2012-06-24 MED ORDER — CLONAZEPAM 1 MG PO TABS
1.0000 mg | ORAL_TABLET | Freq: Every day | ORAL | Status: DC
  Administered 2012-06-24 – 2012-06-26 (×3): 1 mg via ORAL
  Filled 2012-06-24 (×3): qty 1

## 2012-06-24 MED ORDER — TOPIRAMATE 25 MG PO TABS
50.0000 mg | ORAL_TABLET | Freq: Every day | ORAL | Status: DC
  Filled 2012-06-24 (×4): qty 2

## 2012-06-24 MED ORDER — FLEET ENEMA 7-19 GM/118ML RE ENEM: 1.0000 | ENEMA | Freq: Once | RECTAL | Status: AC | PRN

## 2012-06-24 MED ORDER — ACETAMINOPHEN 10 MG/ML IV SOLN
1000.0000 mg | Freq: Four times a day (QID) | INTRAVENOUS | Status: AC
  Administered 2012-06-24 – 2012-06-25 (×4): 1000 mg via INTRAVENOUS
  Filled 2012-06-24 (×5): qty 100

## 2012-06-24 MED ORDER — HYDROMORPHONE HCL PF 1 MG/ML IJ SOLN
0.2500 mg | INTRAMUSCULAR | Status: DC | PRN
  Administered 2012-06-24 (×4): 0.5 mg via INTRAVENOUS

## 2012-06-24 MED ORDER — FAMOTIDINE 20 MG PO TABS
20.0000 mg | ORAL_TABLET | Freq: Two times a day (BID) | ORAL | Status: DC
  Administered 2012-06-24 – 2012-06-27 (×6): 20 mg via ORAL
  Filled 2012-06-24 (×7): qty 1

## 2012-06-24 MED ORDER — POLYETHYLENE GLYCOL 3350 17 G PO PACK
17.0000 g | PACK | Freq: Every day | ORAL | Status: DC | PRN
  Administered 2012-06-24 – 2012-06-25 (×2): 17 g via ORAL

## 2012-06-24 MED ORDER — LACTATED RINGERS IV SOLN
INTRAVENOUS | Status: DC
  Administered 2012-06-24: 1000 mL via INTRAVENOUS
  Administered 2012-06-24: 14:00:00 via INTRAVENOUS

## 2012-06-24 MED ORDER — GLYCOPYRROLATE 0.2 MG/ML IJ SOLN
INTRAMUSCULAR | Status: DC | PRN
  Administered 2012-06-24: 0.4 mg via INTRAVENOUS

## 2012-06-24 MED ORDER — ONDANSETRON HCL 4 MG/2ML IJ SOLN
INTRAMUSCULAR | Status: DC | PRN
  Administered 2012-06-24: 4 mg via INTRAVENOUS

## 2012-06-24 MED ORDER — PROPOFOL 10 MG/ML IV BOLUS
INTRAVENOUS | Status: DC | PRN
  Administered 2012-06-24: 170 mg via INTRAVENOUS

## 2012-06-24 MED ORDER — CEFAZOLIN SODIUM 1-5 GM-% IV SOLN
1.0000 g | Freq: Four times a day (QID) | INTRAVENOUS | Status: AC
  Administered 2012-06-24 – 2012-06-25 (×2): 1 g via INTRAVENOUS
  Filled 2012-06-24 (×3): qty 50

## 2012-06-24 MED ORDER — MORPHINE SULFATE 2 MG/ML IJ SOLN
1.0000 mg | INTRAMUSCULAR | Status: DC | PRN
  Administered 2012-06-24 (×2): 1 mg via INTRAVENOUS
  Administered 2012-06-24 – 2012-06-25 (×2): 2 mg via INTRAVENOUS
  Filled 2012-06-24 (×4): qty 1

## 2012-06-24 SURGICAL SUPPLY — 51 items
BAG ZIPLOCK 12X15 (MISCELLANEOUS) ×2 IMPLANT
BIT DRILL 2.8X128 (BIT) ×2 IMPLANT
BLADE EXTENDED COATED 6.5IN (ELECTRODE) ×2 IMPLANT
BLADE SAW SAG 73X25 THK (BLADE) ×1
BLADE SAW SGTL 73X25 THK (BLADE) ×1 IMPLANT
CLOTH BEACON ORANGE TIMEOUT ST (SAFETY) ×2 IMPLANT
DECANTER SPIKE VIAL GLASS SM (MISCELLANEOUS) ×2 IMPLANT
DRAPE INCISE IOBAN 66X45 STRL (DRAPES) ×2 IMPLANT
DRAPE ORTHO SPLIT 77X108 STRL (DRAPES) ×2
DRAPE POUCH INSTRU U-SHP 10X18 (DRAPES) ×2 IMPLANT
DRAPE SURG ORHT 6 SPLT 77X108 (DRAPES) ×2 IMPLANT
DRAPE U-SHAPE 47X51 STRL (DRAPES) ×2 IMPLANT
DRSG ADAPTIC 3X8 NADH LF (GAUZE/BANDAGES/DRESSINGS) ×2 IMPLANT
DRSG EMULSION OIL 3X16 NADH (GAUZE/BANDAGES/DRESSINGS) ×2 IMPLANT
DRSG MEPILEX BORDER 4X12 (GAUZE/BANDAGES/DRESSINGS) ×2 IMPLANT
DRSG MEPILEX BORDER 4X4 (GAUZE/BANDAGES/DRESSINGS) ×2 IMPLANT
DRSG MEPILEX BORDER 4X8 (GAUZE/BANDAGES/DRESSINGS) ×2 IMPLANT
DURAPREP 26ML APPLICATOR (WOUND CARE) ×2 IMPLANT
ELECT REM PT RETURN 9FT ADLT (ELECTROSURGICAL) ×2
ELECTRODE REM PT RTRN 9FT ADLT (ELECTROSURGICAL) ×1 IMPLANT
EVACUATOR 1/8 PVC DRAIN (DRAIN) ×2 IMPLANT
FACESHIELD LNG OPTICON STERILE (SAFETY) ×8 IMPLANT
GLOVE BIO SURGEON STRL SZ8 (GLOVE) ×2 IMPLANT
GLOVE BIOGEL PI IND STRL 8 (GLOVE) ×2 IMPLANT
GLOVE BIOGEL PI INDICATOR 8 (GLOVE) ×2
GLOVE ECLIPSE 8.0 STRL XLNG CF (GLOVE) ×2 IMPLANT
GLOVE SURG SS PI 6.5 STRL IVOR (GLOVE) ×4 IMPLANT
GOWN STRL NON-REIN LRG LVL3 (GOWN DISPOSABLE) ×4 IMPLANT
GOWN STRL REIN XL XLG (GOWN DISPOSABLE) ×2 IMPLANT
IMMOBILIZER KNEE 20 (SOFTGOODS) ×2
IMMOBILIZER KNEE 20 THIGH 36 (SOFTGOODS) ×1 IMPLANT
KIT BASIN OR (CUSTOM PROCEDURE TRAY) ×2 IMPLANT
MANIFOLD NEPTUNE II (INSTRUMENTS) ×2 IMPLANT
NDL SAFETY ECLIPSE 18X1.5 (NEEDLE) ×1 IMPLANT
NEEDLE HYPO 18GX1.5 SHARP (NEEDLE) ×1
NS IRRIG 1000ML POUR BTL (IV SOLUTION) ×2 IMPLANT
PACK TOTAL JOINT (CUSTOM PROCEDURE TRAY) ×2 IMPLANT
PASSER SUT SWANSON 36MM LOOP (INSTRUMENTS) ×2 IMPLANT
POSITIONER SURGICAL ARM (MISCELLANEOUS) ×2 IMPLANT
SPONGE GAUZE 4X4 12PLY (GAUZE/BANDAGES/DRESSINGS) ×2 IMPLANT
STRIP CLOSURE SKIN 1/2X4 (GAUZE/BANDAGES/DRESSINGS) ×4 IMPLANT
SUT ETHIBOND NAB CT1 #1 30IN (SUTURE) ×4 IMPLANT
SUT MNCRL AB 4-0 PS2 18 (SUTURE) ×2 IMPLANT
SUT VIC AB 2-0 CT1 27 (SUTURE) ×3
SUT VIC AB 2-0 CT1 TAPERPNT 27 (SUTURE) ×3 IMPLANT
SUT VLOC 180 0 24IN GS25 (SUTURE) ×4 IMPLANT
SYR 50ML LL SCALE MARK (SYRINGE) ×2 IMPLANT
TOWEL OR 17X26 10 PK STRL BLUE (TOWEL DISPOSABLE) ×4 IMPLANT
TOWEL OR NON WOVEN STRL DISP B (DISPOSABLE) ×2 IMPLANT
TRAY FOLEY CATH 14FRSI W/METER (CATHETERS) ×2 IMPLANT
WATER STERILE IRR 1500ML POUR (IV SOLUTION) ×4 IMPLANT

## 2012-06-24 NOTE — H&P (View-Only) (Signed)
Debra Griffith  DOB: 1941/09/09 Married / Language: English / Race: White Female  Date of Admission:  06/24/2012  Chief Complaint:  Left Hip Pain  History of Present Illness The patient is a 71 year old female who comes in for a preoperative History and Physical. The patient is scheduled for a left total hip arthroplasty to be performed by Dr. Gus Rankin. Aluisio, MD at [facility] on 06/24/2012. The patient is a 71 year old female who presents for follow up of their hip. The patient is being followed for their left hip pain. Symptoms reported today include: instability and pain with weightbearing (unable to walk). The patient feels that they are doing poorly and report their pain level to be 10 / 10. The following medication has been used for pain control: Hydrocodone. Unfortunately, her pain was getting progressively worse. She had that MRI showing some collapse of the femoral head with a subchondral stress fracture. Since then she's gotten worse. She is essentially in a wheelchair most of the time now. She definitely does better at rest than she does with any activity. She has recently tried a left hip intra-articular steroid injection. It helped well but only for a few days. The shot wore off and the pain came back.  It is of note that she had a fall recently at home on Tuesday night, early in the morning on Wednesday around 2 am. She was seen at the hospital where she had xrays of ther hips and she was told no fractures. She also hit her head during the fall. She had a dull headache into the afternoon that same day, Wednesday, but it resolved and has no complaints of headache, blurred vision, double vision since the fall. They have been treated conservatively in the past for the above stated problem and despite conservative measures, they continue to have progressive pain and severe functional limitations and dysfunction. They have failed non-operative management including  home exercise, medications, and injections. It is felt that they would benefit from undergoing total joint replacement. Risks and benefits of the procedure have been discussed with the patient and they elect to proceed with surgery. There are no active contraindications to surgery such as ongoing infection or rapidly progressive neurological disease.   Problem List/Past Medical Lumbar/Lumbosacral Disc Degeneration (722.52) Chronic pain syndrome (338.4) Osteoarthritis, Hip (715.35) Lumbago (724.2). 02/05/2011 Anxiety Disorder High blood pressure Obesity, Morbid Hypercholesterolemia Depression Insomnia Edema. Lower Extremity Mild Reactive Airway Disease Vitamin D Deficiency Bronchitis Urinary Tract Infection. Past History   Allergies No Known Drug Allergies. 05/08/2011   Family History Congestive Heart Failure. father Chronic Obstructive Lung Disease. father Hypertension. mother, father and sister Heart Disease. father Cerebrovascular Accident. father Cancer. father Osteoporosis. mother Osteoarthritis. mother and sister   Social History Illicit drug use. no Exercise. Exercises weekly Marital status. married Living situation. live with spouse Current work status. retired Copywriter, advertising. 3 Drug/Alcohol Rehab (Previously). no Drug/Alcohol Rehab (Currently). no Number of flights of stairs before winded. 4-5 Tobacco / smoke exposure. no Pain Contract. yes Tobacco use. Never smoker. never smoker Alcohol use. never consumed alcohol No alcohol use Post-Surgical Plans. Plan is to go to [facility]. Advance Directives. Living Will   Medication History Multi-Vitamin ( Oral) Active. Glucosamine Chondroitin ( Oral) Active. Calcium Carbonate (600MG  Tablet, Oral) Active. Vitamin D (2000UNIT Capsule, Oral) Active. Maxzide-25 (37.5-25MG  Tablet, Oral) Active. Norco (5-325MG  Tablet, Oral) Active. Lasix (40MG  Tablet, Oral) Active. Klor-Con M20 (  Tablet ER, Oral) Active. Aleve (220MG  Capsule, Oral)  Active. Ranitidine 75 (75MG  Tablet, Oral) Active. Exforge (10-320MG  Tablet, Oral) Active. Cyclobenzaprine HCl (10MG  Tablet, Oral) Active. Metoprolol Tartrate (25MG  Tablet, Oral) Active. KlonoPIN (1MG  Tablet, Oral) Active. Ambien (10MG  Tablet, Oral) Active. Topiramate (25MG  Tablet, Oral) Active. Phentermine HCl (15MG  Capsule, Oral) Active. Aspirin Childrens (81MG  Tablet Chewable, Oral) Active.   Past Surgical History Hysterectomy. complete (non-cancerous) Cesarean Delivery. 3 or more times Arthroscopic Knee Surgery - Right   Review of Systems General:Not Present- Chills, Fever, Night Sweats, Fatigue, Weight Gain, Weight Loss and Memory Loss. Skin:Not Present- Hives, Itching, Rash, Eczema and Lesions. HEENT:Not Present- Tinnitus, Headache, Double Vision, Visual Loss, Hearing Loss and Dentures. Respiratory:Not Present- Shortness of breath with exertion, Shortness of breath at rest, Allergies, Coughing up blood and Chronic Cough. Cardiovascular:Not Present- Chest Pain, Racing/skipping heartbeats, Difficulty Breathing Lying Down, Murmur, Swelling and Palpitations. Gastrointestinal:Present- Constipation. Not Present- Bloody Stool, Heartburn, Abdominal Pain, Vomiting, Nausea, Diarrhea, Difficulty Swallowing, Jaundice and Loss of appetitie. Female Genitourinary:Not Present- Blood in Urine, Urinary frequency, Weak urinary stream, Discharge, Flank Pain, Incontinence, Painful Urination, Urgency, Urinary Retention and Urinating at Night. Musculoskeletal:Present- Joint Pain and Back Pain. Not Present- Muscle Weakness, Muscle Pain, Joint Swelling, Morning Stiffness and Spasms. Neurological:Not Present- Tremor, Dizziness, Blackout spells, Paralysis, Difficulty with balance and Weakness. Psychiatric:Not Present- Insomnia.   Vitals Weight: 219 lb Height: 60 in. Body Surface Area: 2.05 m Body Mass Index: 42.77 kg/m Pulse: 76  (Regular) Resp.: 14 (Unlabored) BP: 128/78 (Sitting, Right Arm, Standard)   Physical Exam The physical exam findings are as follows:  Note: Pateint is a 71 year old female with continued hip pain. Patient is accompanied today by her husband Fredrik Cove.   General Mental Status - Alert, cooperative and good historian. General Appearance- pleasant. Not in acute distress. Orientation- Oriented X3. Build & Nutrition- Well nourished and Well developed.   Head and Neck HeadNote: Normocephalic but she does have a small superficial abrasion on the posterior right lateral occpital region of her head beneath the hair form when she had a fall recently at home. Neck Global Assessment- supple. no bruit auscultated on the right and no bruit auscultated on the left.   Eye Pupil- Bilateral- Regular and Round. Motion- Bilateral- EOMI. reading glasses  Chest and Lung Exam Auscultation: Breath sounds:- clear at anterior chest wall and - clear at posterior chest wall. Adventitious sounds:- No Adventitious sounds.   Cardiovascular Auscultation:Rhythm- Regular rate and rhythm. Heart Sounds- S1 WNL and S2 WNL. Murmurs & Other Heart Sounds:Auscultation of the heart reveals - No Murmurs.   Abdomen Inspection:Contour- Generalized moderate distention. Palpation/Percussion:Tenderness- Abdomen is non-tender to palpation. Rigidity (guarding)- Abdomen is soft. Auscultation:Auscultation of the abdomen reveals - Bowel sounds normal.   Female Genitourinary Not done, not pertinent to present illness  Musculoskeletal  On exam, well-developed female, in no apparent distress. ROM of the right hip is flexion about 90, minimal rotation, about 20 abduction.  RADIOGRAPHS: X-rays today, AP pelvis and lateral of the hip. She has bone on bone arthritis of the left hip with loss of the joint space. No obvious collapse of the femoral head.  Assessment & Plan Osteoarthritis,  Hip (715.35) Impression: Left Hip  Note: Patient is for a left total hip replaement by Dr. Lequita Halt.  Plan is to go to [facility].  Patient states that she has had problems with anesthesia in the past.  PCP - Dr. Johnella Moloney  Signed electronically by Roberts Gaudy, PA-C

## 2012-06-24 NOTE — Interval H&P Note (Signed)
History and Physical Interval Note:  06/24/2012 1:13 PM  Debra Griffith  has presented today for surgery, with the diagnosis of osteoarthritis left hip  The various methods of treatment have been discussed with the patient and family. After consideration of risks, benefits and other options for treatment, the patient has consented to  Procedure(s) (LRB) with comments: TOTAL HIP ARTHROPLASTY (Left) as a surgical intervention .  The patient's history has been reviewed, patient examined, no change in status, stable for surgery.  I have reviewed the patient's chart and labs.  Questions were answered to the patient's satisfaction.     Loanne Drilling

## 2012-06-24 NOTE — Anesthesia Preprocedure Evaluation (Addendum)
Anesthesia Evaluation  Patient identified by MRN, date of birth, ID band Patient awake    Reviewed: Allergy & Precautions, H&P , NPO status , Patient's Chart, lab work & pertinent test results  History of Anesthesia Complications (+) PONV  Airway Mallampati: II TM Distance: <3 FB Neck ROM: Full    Dental No notable dental hx. (+) Dental Advisory Given   Pulmonary neg pulmonary ROS,  breath sounds clear to auscultation  + decreased breath sounds      Cardiovascular hypertension, Pt. on medications Rhythm:Regular Rate:Normal     Neuro/Psych negative neurological ROS  negative psych ROS   GI/Hepatic Neg liver ROS, GERD-  ,  Endo/Other  Morbid obesity  Renal/GU negative Renal ROS  negative genitourinary   Musculoskeletal negative musculoskeletal ROS (+)   Abdominal   Peds negative pediatric ROS (+)  Hematology negative hematology ROS (+)   Anesthesia Other Findings   Reproductive/Obstetrics negative OB ROS                          Anesthesia Physical Anesthesia Plan  ASA: III  Anesthesia Plan: General   Post-op Pain Management:    Induction: Intravenous  Airway Management Planned: Oral ETT  Additional Equipment:   Intra-op Plan:   Post-operative Plan: Extubation in OR  Informed Consent: I have reviewed the patients History and Physical, chart, labs and discussed the procedure including the risks, benefits and alternatives for the proposed anesthesia with the patient or authorized representative who has indicated his/her understanding and acceptance.   Dental advisory given  Plan Discussed with: CRNA and Surgeon  Anesthesia Plan Comments:         Anesthesia Quick Evaluation

## 2012-06-24 NOTE — Anesthesia Postprocedure Evaluation (Signed)
Anesthesia Post Note  Patient: Debra Griffith  Procedure(s) Performed: Procedure(s) (LRB): TOTAL HIP ARTHROPLASTY (Left)  Anesthesia type: General  Patient location: PACU  Post pain: Pain level controlled  Post assessment: Post-op Vital signs reviewed  Last Vitals:  Filed Vitals:   06/24/12 1630  BP: 120/72  Pulse: 66  Temp: 36.4 C  Resp: 16    Post vital signs: Reviewed  Level of consciousness: sedated  Complications: No apparent anesthesia complications

## 2012-06-24 NOTE — Op Note (Signed)
Pre-operative diagnosis- Osteoarthritis Left hip  Post-operative diagnosis- Osteoarthritis  Left hip  Procedure-  LeftTotal Hip Arthroplasty  Surgeon- Gus Rankin. Pinchos Topel, MD  Assistant- Dimitri Ped, PA-C   Anesthesia  General  EBL- 500   Drain Hemovac   Complication- None  Condition-PACU - hemodynamically stable.   Brief Clinical Note-  Debra Griffith is a 71 y.o. female with end stage arthritis of her left hip with progressively worsening pain and dysfunction. Pain occurs with activity and rest including pain at night. She has tried analgesics, protected weight bearing and rest without benefit. Pain is too severe to attempt physical therapy. Radiographs demonstrate bone on bone arthritis with subchondral cyst formation. She presents now for left THA.  Procedure in detail-   The patient is brought into the operating room and placed on the operating table. After successful administration of General  anesthesia, the patient is placed in the  Right lateral decubitus position with the  Left side up and held in place with the hip positioner. The lower extremity is isolated from the perineum with plastic drapes and time-out is performed by the surgical team. The lower extremity is then prepped and draped in the usual sterile fashion. A short posterolateral incision is made with a ten blade through the subcutaneous tissue to the level of the fascia lata which is incised in line with the skin incision. The sciatic nerve is palpated and protected and the short external rotators and capsule are isolated from the femur. The hip is then dislocated and the center of the femoral head is marked. A trial prosthesis is placed such that the trial head corresponds to the center of the patients' native femoral head. The resection level is marked on the femoral neck and the resection is made with an oscillating saw. The femoral head is removed and femoral retractors placed to gain access to the femoral  canal.      The canal finder is passed into the femoral canal and the canal is thoroughly irrigated with sterile saline to remove the fatty contents. Axial reaming is performed to 13.5  mm, proximal reaming to 18D  and the sleeve machined to a small. A 18 D small trial sleeve is placed into the proximal femur.      The femur is then retracted anteriorly to gain acetabular exposure. Acetabular retractors are placed and the labrum and osteophytes are removed, Acetabular reaming is performed to 49  mm and a 50  mm Pinnacle acetabular shell is placed in anatomic position with excellent purchase. Additional dome screws were not needed. The permanent 32 mm neutral + 4 Marathon liner is placed into the acetabular shell.      The trial femur is then placed into the femoral canal. The size is 18 x 13  stem with a 36 + 8  neck and a 32 + 3 head with the neck version matching  the patients' native anteversion. The hip is reduced with excellent stability with full extension and full external rotation, 70 degrees flexion with 40 degrees adduction and 90 degrees internal rotation and 90 degrees of flexion with 70 degrees of internal rotation. The operative leg is placed on top of the non-operative leg and the leg lengths are found to be equal. The trials are then removed and the permanent implant of the same size is impacted into the femoral canal. The ceramic femoral head of the same size as the trial is placed and the hip is reduced with the same stability  parameters. The operative leg is again placed on top of the non-operative leg and the leg lengths are found to be equal.      The wound is then copiously irrigated with saline solution and the capsule and short external rotators are re-attached to the femur through drill holes with Ethibond suture. The fascia lata is closed over a hemovac drain with #1 vicryl suture and the fascia lata, gluteal muscles and subcutaneous tissues are injected with Exparel 20ml diluted with  saline 50ml. The subcutaneous tissues are closed with #1 and2-0 vicryl and the subcuticular layer closed with running 4-0 Monocryl. The drain is hooked to suction, incision cleaned and dried, and steri-srips and a bulky sterile dressing applied. The limb is placed into a knee immobilizer and the patient is awakened and transported to recovery in stable condition.      Please note that a surgical assistant was a medical necessity for this procedure in order to perform it in a safe and expeditious manner. The assistant was necessary to provide retraction to the vital neurovascular structures and to retract and position the limb to allow for anatomic placement of the prosthetic components.  Gus Rankin Debra Canny, MD    06/24/2012, 2:45 PM

## 2012-06-24 NOTE — Transfer of Care (Signed)
Immediate Anesthesia Transfer of Care Note  Patient: Debra Griffith  Procedure(s) Performed: Procedure(s) (LRB) with comments: TOTAL HIP ARTHROPLASTY (Left)  Patient Location: PACU  Anesthesia Type: General  Level of Consciousness: sedated  Airway & Oxygen Therapy: Patient Spontanous Breathing and Patient connected to face mask oxygen  Post-op Assessment: Report given to PACU RN and Post -op Vital signs reviewed and stable  Post vital signs: Reviewed and stable  Complications: No apparent anesthesia complications

## 2012-06-25 ENCOUNTER — Encounter (HOSPITAL_COMMUNITY): Payer: Self-pay | Admitting: Orthopedic Surgery

## 2012-06-25 DIAGNOSIS — E871 Hypo-osmolality and hyponatremia: Secondary | ICD-10-CM

## 2012-06-25 DIAGNOSIS — D62 Acute posthemorrhagic anemia: Secondary | ICD-10-CM

## 2012-06-25 HISTORY — DX: Hypo-osmolality and hyponatremia: E87.1

## 2012-06-25 HISTORY — DX: Acute posthemorrhagic anemia: D62

## 2012-06-25 LAB — CBC
MCH: 30.9 pg (ref 26.0–34.0)
MCV: 91.4 fL (ref 78.0–100.0)
Platelets: 267 10*3/uL (ref 150–400)
RDW: 13.5 % (ref 11.5–15.5)
WBC: 11.2 10*3/uL — ABNORMAL HIGH (ref 4.0–10.5)

## 2012-06-25 LAB — GLUCOSE, CAPILLARY: Glucose-Capillary: 112 mg/dL — ABNORMAL HIGH (ref 70–99)

## 2012-06-25 LAB — BASIC METABOLIC PANEL
Calcium: 8.6 mg/dL (ref 8.4–10.5)
Creatinine, Ser: 0.71 mg/dL (ref 0.50–1.10)
GFR calc Af Amer: >90 mL/min (ref >90)

## 2012-06-25 NOTE — Evaluation (Addendum)
Occupational Therapy Evaluation Patient Details Name: Debra Griffith MRN: [id number] DOB: Nov 01, 1940 Today's Date: 06/25/2012 Time: 1191-4782 OT Time Calculation (min): 41 min  OT Assessment / Plan / Recommendation Clinical Impression  Pt presents POD 1 LTHR. Skilled OT recommended to maximize independence with BADLs to min A/supervision level in prep for d/c to next venue of care.    OT Assessment  Patient needs continued OT Services    Follow Up Recommendations  Skilled nursing facility    Barriers to Discharge Decreased caregiver support    Equipment Recommendations  3 in 1 bedside comode;Rolling walker with 5" wheels    Recommendations for Other Services    Frequency   1x per week    Precautions / Restrictions Precautions Precautions: Posterior Hip Restrictions Weight Bearing Restrictions: No LLE Weight Bearing: Weight bearing as tolerated   Pertinent Vitals/Pain Reported 5/10 pain in LLE. Repositioned and cold applied.    ADL  Grooming: Simulated;Wash/dry face;Set up Where Assessed - Grooming: Unsupported sitting Upper Body Bathing: Simulated;Set up Where Assessed - Upper Body Bathing: Unsupported sitting Lower Body Bathing: Simulated;Maximal assistance Where Assessed - Lower Body Bathing: Supported sit to stand Upper Body Dressing: Simulated;Set up Where Assessed - Upper Body Dressing: Unsupported sitting Lower Body Dressing: Simulated;Maximal assistance Where Assessed - Lower Body Dressing: Supported sit to stand Toilet Transfer: Performed;Minimal assistance;Moderate assistance Toilet Transfer Method: Stand pivot Toilet Transfer Equipment: Bedside commode Toileting - Clothing Manipulation and Hygiene: Performed;+1 Total assistance Where Assessed - Toileting Clothing Manipulation and Hygiene: Sit to stand from 3-in-1 or toilet Equipment Used: Rolling walker ADL Comments: Pt transferred to 3n1, then BTB. Pt fatigued by end of session. Just finished working with  PT.    OT Diagnosis:    OT Problem List: Decreased activity tolerance;Decreased knowledge of use of DME or AE;Pain OT Treatment Interventions: Self-care/ADL training;Therapeutic activities;DME and/or AE instruction;Patient/family education   OT Goals Acute Rehab OT Goals OT Goal Formulation: With patient Time For Goal Achievement: 07/02/12 Potential to Achieve Goals: Good ADL Goals Pt Will Perform Grooming: with supervision;Standing at sink ADL Goal: Grooming - Progress: Goal set today Pt Will Perform Lower Body Bathing: with min assist;Sit to stand from chair;Sit to stand from bed;with adaptive equipment ADL Goal: Lower Body Bathing - Progress: Goal set today Pt Will Perform Lower Body Dressing: with min assist;Sit to stand from chair;Sit to stand from bed;with adaptive equipment ADL Goal: Lower Body Dressing - Progress: Goal set today Pt Will Transfer to Toilet: with supervision;3-in-1;Ambulation;Raised toilet seat with arms;Maintaining hip precautions ADL Goal: Toilet Transfer - Progress: Goal set today Pt Will Perform Toileting - Clothing Manipulation: with min assist;Sitting on 3-in-1 or toilet;Standing ADL Goal: Toileting - Clothing Manipulation - Progress: Goal set today Pt Will Perform Toileting - Hygiene: with min assist;Sit to stand from 3-in-1/toilet ADL Goal: Toileting - Hygiene - Progress: Goal set today  Visit Information  Last OT Received On: 06/25/12 Assistance Needed: +1    Subjective Data  Subjective: I feel like I need to move my bowels. Patient Stated Goal: Go to [facility] for rehab.   Prior Functioning     Home Living Lives With: Spouse Type of Home: House Prior Function Level of Independence: Needs assistance Needs Assistance: Meal Prep;Light Housekeeping Meal Prep: Total Light Housekeeping: Total Comments: Pt planning discharge to [facility] for rehab. Communication Communication: No difficulties Dominant Hand: Right           Vision/Perception     Cognition  Overall Cognitive Status: Appears within functional  limits for tasks assessed/performed Arousal/Alertness: Awake/alert Orientation Level: Appears intact for tasks assessed Behavior During Session: [facility] for tasks performed    Extremity/Trunk Assessment Right Upper Extremity Assessment RUE ROM/Strength/Tone: [facility] for tasks assessed Left Upper Extremity Assessment LUE ROM/Strength/Tone: WFL for tasks assessed     Mobility Bed Mobility Bed Mobility: Sit to Supine Sit to Supine: 3: Mod assist;With rail;HOB flat Details for Bed Mobility Assistance: Physical A needed for both LEs. Pt with max difficulty repositioning self in bed. Cues for hand placement and technique. Transfers Transfers: Sit to Stand;Stand to Sit Sit to Stand: 3: Mod assist;With upper extremity assist;From chair/3-in-1 Stand to Sit: 4: Min assist;With upper extremity assist;To bed Details for Transfer Assistance: cues needed for hand placement and LLE management.     Shoulder Instructions     Exercise     Balance     End of Session OT - End of Session Activity Tolerance: Patient limited by fatigue Patient left: in bed;with call bell/phone within reach;with nursing in room  GO     Shantell Belongia A OTR/L [phone] 06/25/2012, 2:34 PM

## 2012-06-25 NOTE — Evaluation (Signed)
Physical Therapy Evaluation Patient Details Name: Debra Griffith MRN: [id number] DOB: 12/19/1940 Today's Date: 06/25/2012 Time: 7829-5621 PT Time Calculation (min): 50 min  PT Assessment / Plan / Recommendation Clinical Impression  Pt s/p L THR presents with decreased L LE strength/ROM and THP limiting functional mobility    PT Assessment  Patient needs continued PT services    Follow Up Recommendations  Post acute inpatient    Does the patient have the potential to tolerate intense rehabilitation   No, Recommend SNF  Barriers to Discharge        Equipment Recommendations  3 in 1 bedside comode;Rolling walker with 5" wheels    Recommendations for Other Services OT consult   Frequency 7X/week    Precautions / Restrictions Precautions Precautions: Posterior Hip Precaution Comments: Sign hung in room Restrictions Weight Bearing Restrictions: No LLE Weight Bearing: Weight bearing as tolerated   Pertinent Vitals/Pain 5/10; premedicated; ice pack provided      Mobility  Bed Mobility Bed Mobility: Sit to Supine Supine to Sit: 1: +2 Total assist Supine to Sit: Patient Percentage: 60% Sit to Supine: 3: Mod assist;With rail;HOB flat Details for Bed Mobility Assistance: Physical A needed for both LEs. Pt with max difficulty repositioning self in bed. Cues for hand placement and technique. Transfers Transfers: Sit to Stand;Stand to Sit Sit to Stand: 3: Mod assist;With upper extremity assist;From chair/3-in-1 Stand to Sit: 4: Min assist;With upper extremity assist;To bed Details for Transfer Assistance: cues needed for hand placement and RLE management. Ambulation/Gait Ambulation/Gait Assistance: 1: +2 Total assist Ambulation/Gait: Patient Percentage: 70% Ambulation Distance (Feet): 24 Feet (and 16) Assistive device: Rolling walker Ambulation/Gait Assistance Details: cues for sequence, posture, position from RW and ER on L Gait Pattern: Step-to pattern Gait velocity:  slow    Shoulder Instructions     Exercises Total Joint Exercises Ankle Circles/Pumps: AROM;10 reps;Supine;Both Quad Sets: AROM;Both;10 reps;Supine Heel Slides: AAROM;10 reps;Supine;Left Hip ABduction/ADduction: AAROM;10 reps;Supine;Left   PT Diagnosis: Difficulty walking  PT Problem List: Decreased strength;Decreased range of motion;Decreased activity tolerance;Decreased knowledge of use of DME;Obesity;Pain;Decreased mobility PT Treatment Interventions: DME instruction;Gait training;Stair training;Functional mobility training;Therapeutic activities;Therapeutic exercise;Patient/family education   PT Goals Acute Rehab PT Goals PT Goal Formulation: With patient Time For Goal Achievement: 06/30/12 Potential to Achieve Goals: Good Pt will go Supine/Side to Sit: with min assist PT Goal: Supine/Side to Sit - Progress: Goal set today Pt will go Sit to Supine/Side: with min assist PT Goal: Sit to Supine/Side - Progress: Goal set today Pt will go Sit to Stand: with supervision PT Goal: Sit to Stand - Progress: Goal set today Pt will go Stand to Sit: with supervision PT Goal: Stand to Sit - Progress: Goal set today Pt will Ambulate: 51 - 150 feet;with supervision;with rolling walker PT Goal: Ambulate - Progress: Goal set today  Visit Information  Last PT Received On: 06/25/12 Assistance Needed: +1    Subjective Data  Subjective: I'm going to [facility].  I've already been out there to look around Patient Stated Goal: Resume previous lifestyle   Prior Functioning  Home Living Lives With: Spouse Type of Home: House Home Adaptive Equipment: Walker - rolling Prior Function Level of Independence: Needs assistance Needs Assistance: Meal Prep;Light Housekeeping Meal Prep: Total Light Housekeeping: Total Comments: Pt planning discharge to [facility] for rehab. Communication Communication: No difficulties Dominant Hand: Right    Cognition  Overall Cognitive Status: Appears  within functional limits for tasks assessed/performed Arousal/Alertness: Awake/alert Orientation Level: Appears intact for tasks  assessed Behavior During Session: [facility] for tasks performed    Extremity/Trunk Assessment Right Upper Extremity Assessment RUE ROM/Strength/Tone: [facility] for tasks assessed Left Upper Extremity Assessment LUE ROM/Strength/Tone: [facility] for tasks assessed Right Lower Extremity Assessment RLE ROM/Strength/Tone: [facility] for tasks assessed Left Lower Extremity Assessment LLE ROM/Strength/Tone: Deficits LLE ROM/Strength/Tone Deficits: hip strength 2+/5 with AAROM at hip to 70 flex and 15 abd   Balance    End of Session PT - End of Session Equipment Utilized During Treatment: Gait belt Activity Tolerance: Patient limited by fatigue Patient left: in chair;with call bell/phone within reach Nurse Communication: Mobility status  GP     [name] 06/25/2012, 5:13 PM

## 2012-06-25 NOTE — Progress Notes (Signed)
Utilization review completed.  

## 2012-06-25 NOTE — Progress Notes (Signed)
Clinical Social Work Department CLINICAL SOCIAL WORK PLACEMENT NOTE 06/25/2012  Patient:  Debra Griffith, Debra Griffith  Account Number:  [id number] Admit date:  06/24/2012  Clinical Social Worker:  Cori Razor, LCSW  Date/time:  06/25/2012 01:37 PM  Clinical Social Work is seeking post-discharge placement for this patient at the following level of care:   SKILLED NURSING   (*CSW will update this form in Epic as items are completed)     Patient/family provided with [facility] Department of Clinical Social Work's list of facilities offering this level of care within the geographic area requested by the patient (or if unable, by the patient's family).    Patient/family informed of their freedom to choose among providers that offer the needed level of care, that participate in Medicare, Medicaid or managed care program needed by the patient, have an available bed and are willing to accept the patient.    Patient/family informed of [facility]' ownership interest in [facility], as well as of the fact that they are under no obligation to receive care at this facility.  PASARR submitted to EDS on 06/25/2012 PASARR number received from EDS on 06/25/2012  FL2 transmitted to all facilities in geographic area requested by pt/family on  06/25/2012 FL2 transmitted to all facilities within larger geographic area on   Patient informed that his/her managed care company has contracts with or will negotiate with  certain facilities, including the following:     Patient/family informed of bed offers received:  06/25/2012 Patient chooses bed at [facility] Physician recommends and patient chooses bed at    Patient to be transferred to [facility] on   Patient to be transferred to facility by   The following physician request were entered in Epic:   Additional Comments:  Cori Razor LCSW [phone]

## 2012-06-25 NOTE — Progress Notes (Signed)
   Subjective: 1 Day Post-Op Procedure(s) (LRB): TOTAL HIP ARTHROPLASTY (Left) Patient reports pain as mild.   Patient seen in rounds with Dr. Lequita Halt. Doing great this morning. Patient is well, and has had no acute complaints or problems We will start therapy today.  Plan is to go Skilled nursing facility after hospital stay.  Objective: Vital signs in last 24 hours: Temp:  [97.4 F (36.3 C)-98.3 F (36.8 C)] 98.3 F (36.8 C) (10/09 0510) Pulse Rate:  [56-94] 62  (10/09 0510) Resp:  [10-20] 16  (10/09 0510) BP: (87-158)/(49-79) 112/72 mmHg (10/09 0510) SpO2:  [95 %-100 %] 100 % (10/09 0510) Weight:  [102.468 kg (225 lb 14.4 oz)] 102.468 kg (225 lb 14.4 oz) (10/08 1630)  Intake/Output from previous day:  Intake/Output Summary (Last 24 hours) at 06/25/12 0932 Last data filed at 06/25/12 0510  Gross per 24 hour  Intake 4316.25 ml  Output   3695 ml  Net 621.25 ml    Intake/Output this shift:    Labs:  Basename 06/25/12 0444  HGB 10.1*    Basename 06/25/12 0444  WBC 11.2*  RBC 3.27*  HCT 29.9*  PLT 267    Basename 06/25/12 0444  NA 134*  K 4.2  CL 99  CO2 27  BUN 8  CREATININE 0.71  GLUCOSE 121*  CALCIUM 8.6   No results found for this basename: LABPT:2,INR:2 in the last 72 hours  EXAM General - Patient is Alert, Appropriate and Oriented Extremity - Neurovascular intact Sensation intact distally Dressing - dressing C/D/I Motor Function - intact, moving foot and toes well on exam.  Hemovac pulled without difficulty.  Past Medical History  Diagnosis Date  . Complication of anesthesia     severe diarrhea and vomiting after hysterectomy   . PONV (postoperative nausea and vomiting)   . Hypertension   . Dysrhythmia     [facility] - > 1 year ago   . Anxiety   . GERD (gastroesophageal reflux disease)   . Arthritis   . Anemia     hx of   . Chronic kidney disease     hx of uti , hx of stress incontinence     Assessment/Plan: 1 Day Post-Op Procedure(s)  (LRB): TOTAL HIP ARTHROPLASTY (Left) Principal Problem:  *OA (osteoarthritis) of hip Active Problems:  Postop Hyponatremia  Postop Acute blood loss anemia  Estimated Body mass index is 44.12 kg/(m^2) as calculated from the following:   Height as of this encounter: 5\' 0" (1.524 m).   Weight as of this encounter: 225 lb 14.4 oz(102.468 kg). Advance diet Up with therapy Discharge to SNF  DVT Prophylaxis - Xarelto Weight Bearing As Tolerated left Leg D/C Knee Immobilizer Hemovac Pulled Begin Therapy Hip Preacutions No vaccines.  PERKINS, ALEXZANDREW 06/25/2012, 9:32 AM

## 2012-06-25 NOTE — Progress Notes (Signed)
Physical Therapy Treatment Patient Details Name: Debra Griffith MRN: [id number] DOB: Jun 06, 1941 Today's Date: 06/25/2012 Time: 4098-1191 PT Time Calculation (min): 26 min  PT Assessment / Plan / Recommendation Comments on Treatment Session       Follow Up Recommendations  Post acute inpatient     Does the patient have the potential to tolerate intense rehabilitation  No, Recommend SNF  Barriers to Discharge        Equipment Recommendations  3 in 1 bedside comode;Rolling walker with 5" wheels    Recommendations for Other Services OT consult  Frequency 7X/week   Plan Discharge plan remains appropriate    Precautions / Restrictions Precautions Precautions: Posterior Hip Precaution Comments: Pt recalls 1/3 THP without cues Restrictions Weight Bearing Restrictions: No LLE Weight Bearing: Weight bearing as tolerated   Pertinent Vitals/Pain     Mobility  Bed Mobility Bed Mobility: Sit to Supine Supine to Sit: 1: +2 Total assist Supine to Sit: Patient Percentage: 60% Sit to Supine: 3: Mod assist;With rail;HOB flat Details for Bed Mobility Assistance: Physical A needed for both LEs. Pt with max difficulty repositioning self in bed. Cues for hand placement and technique. Transfers Transfers: Sit to Stand;Stand to Sit Sit to Stand: 3: Mod assist;With upper extremity assist;From chair/3-in-1 Stand to Sit: 4: Min assist;With upper extremity assist;To bed Details for Transfer Assistance: cues needed for hand placement and RLE management. Ambulation/Gait Ambulation/Gait Assistance: 1: +2 Total assist Ambulation/Gait: Patient Percentage: 70% Ambulation Distance (Feet): 38 Feet Assistive device: Rolling walker Ambulation/Gait Assistance Details: cues for posture, sequence, position from RW Gait Pattern: Step-to pattern Gait velocity: slow    Exercises Total Joint Exercises Ankle Circles/Pumps: AROM;10 reps;Supine;Both Quad Sets: AROM;Both;10 reps;Supine Heel Slides: AAROM;10  reps;Supine;Left Hip ABduction/ADduction: AAROM;10 reps;Supine;Left   PT Diagnosis: Difficulty walking  PT Problem List: Decreased strength;Decreased range of motion;Decreased activity tolerance;Decreased knowledge of use of DME;Obesity;Pain;Decreased mobility PT Treatment Interventions: DME instruction;Gait training;Stair training;Functional mobility training;Therapeutic activities;Therapeutic exercise;Patient/family education   PT Goals Acute Rehab PT Goals PT Goal Formulation: With patient Time For Goal Achievement: 06/30/12 Potential to Achieve Goals: Good Pt will go Supine/Side to Sit: with min assist PT Goal: Supine/Side to Sit - Progress: Goal set today Pt will go Sit to Supine/Side: with min assist PT Goal: Sit to Supine/Side - Progress: Goal set today Pt will go Sit to Stand: with supervision PT Goal: Sit to Stand - Progress: Goal set today Pt will go Stand to Sit: with supervision PT Goal: Stand to Sit - Progress: Goal set today Pt will Ambulate: 51 - 150 feet;with supervision;with rolling walker PT Goal: Ambulate - Progress: Goal set today  Visit Information  Last PT Received On: 06/25/12 Assistance Needed: +1    Subjective Data  Subjective: I'm going to [place] place.  I've already been out there to look around Patient Stated Goal: Resume previous lifestyle   Cognition  Overall Cognitive Status: Appears within functional limits for tasks assessed/performed Arousal/Alertness: Awake/alert Orientation Level: Appears intact for tasks assessed Behavior During Session: [facility] for tasks performed    Balance     End of Session PT - End of Session Equipment Utilized During Treatment: Gait belt Activity Tolerance: Patient limited by fatigue Patient left: with call bell/phone within reach;in bed Nurse Communication: Mobility status   GP     Debra Griffith 06/25/2012, 5:21 PM

## 2012-06-25 NOTE — Progress Notes (Signed)
Clinical Social Work Department BRIEF PSYCHOSOCIAL ASSESSMENT 06/25/2012  Patient:  Debra Griffith, Debra Griffith     Account Number:  [id number]     Admit date:  06/24/2012  Clinical Social Worker:  Candie Chroman  Date/Time:  06/25/2012 01:27 PM  Referred by:  CSW  Date Referred:  06/25/2012 Referred for  SNF Placement   Other Referral:   Interview type:  Patient Other interview type:    PSYCHOSOCIAL DATA Living Status:  HUSBAND Admitted from facility:   Level of care:   Primary support name:  Joaquim Lai Primary support relationship to patient:  SPOUSE Degree of support available:   unclear    CURRENT CONCERNS Current Concerns  Post-Acute Placement   Other Concerns:    SOCIAL WORK ASSESSMENT / PLAN Pt is a 71 yr old female living at home prior to hospitalization. CSW met with pt to assist with d/c planning. Pt has made prior arrangements to have ST rehab at [facility] following hospital d/c. CSW has contacted SNF aand d/c plans have been confirmed. cSW will follow to assist with d/c plans to SNF.   Assessment/plan status:  Psychosocial Support/Ongoing Assessment of Needs Other assessment/ plan:   Information/referral to community resources:   None needed at this time.    PATIENT'S/FAMILY'S RESPONSE TO PLAN OF CARE: Pt is looking forward to rehab at [facility].     Cori Razor LCSW [phone]

## 2012-06-25 NOTE — Care Management Note (Unsigned)
    Page 1 of 1   06/25/2012     11:28:51 AM   CARE MANAGEMENT NOTE 06/25/2012  Patient:  Debra Griffith, Debra Griffith   Account Number:  [id number]  Date Initiated:  06/25/2012  Documentation initiated by:  Lanier Clam  Subjective/Objective Assessment:   ADMITTED W/OA L HIP     Action/Plan:   FROM HOME   Anticipated DC Date:  06/27/2012   Anticipated DC Plan:  SKILLED NURSING FACILITY  In-house referral  Clinical Social Worker      DC Planning Services  CM consult      Choice offered to / List presented to:             Status of service:  In process, will continue to follow Medicare Important Message given?   (If response is "NO", the following Medicare IM given date fields will be blank) Date Medicare IM given:   Date Additional Medicare IM given:    Discharge Disposition:    Per UR Regulation:  Reviewed for med. necessity/level of care/duration of stay  If discussed at Long Length of Stay Meetings, dates discussed:    Comments:  06/25/12 [facility] Arrielle Mcginn RN,BSN NCM [phone]

## 2012-06-26 DIAGNOSIS — Z96649 Presence of unspecified artificial hip joint: Secondary | ICD-10-CM | POA: Diagnosis not present

## 2012-06-26 LAB — BASIC METABOLIC PANEL
BUN: 8 mg/dL (ref 6–23)
Chloride: 98 mEq/L (ref 96–112)
GFR calc Af Amer: >90 mL/min (ref >90)
GFR calc non Af Amer: 87 mL/min — ABNORMAL LOW (ref >90)
Potassium: 3.9 mEq/L (ref 3.5–5.1)
Sodium: 132 mEq/L — ABNORMAL LOW (ref 135–145)

## 2012-06-26 LAB — GLUCOSE, CAPILLARY: Glucose-Capillary: 115 mg/dL — ABNORMAL HIGH (ref 70–99)

## 2012-06-26 LAB — CBC
HCT: 28 % — ABNORMAL LOW (ref 36.0–46.0)
MCHC: 33.9 g/dL (ref 30.0–36.0)
RDW: 13.7 % (ref 11.5–15.5)
WBC: 11.3 10*3/uL — ABNORMAL HIGH (ref 4.0–10.5)

## 2012-06-26 MED ORDER — POLYSACCHARIDE IRON COMPLEX 150 MG PO CAPS
150.0000 mg | ORAL_CAPSULE | Freq: Every day | ORAL | Status: DC
  Administered 2012-06-26 – 2012-06-27 (×2): 150 mg via ORAL
  Filled 2012-06-26 (×2): qty 1

## 2012-06-26 NOTE — Progress Notes (Signed)
   Subjective: 2 Days Post-Op Procedure(s) (LRB): TOTAL HIP ARTHROPLASTY (Left) Patient reports pain as mild.   Patient seen in rounds with Dr. Lequita Halt. Patient is well, and has had no acute complaints or problems Plan is to go Skilled nursing facility after hospital stay.  Objective: Vital signs in last 24 hours: Temp:  [98.6 F (37 C)-99.7 F (37.6 C)] 99.7 F (37.6 C) (10/10 0920) Pulse Rate:  [91-104] 98  (10/10 0920) Resp:  [14-20] 14  (10/10 0920) BP: (112-125)/(49-77) 125/77 mmHg (10/10 0920) SpO2:  [94 %-98 %] 94 % (10/10 0920)  Intake/Output from previous day:  Intake/Output Summary (Last 24 hours) at 06/26/12 1114 Last data filed at 06/26/12 1105  Gross per 24 hour  Intake 983.83 ml  Output   1250 ml  Net -266.17 ml    Intake/Output this shift: Total I/O In: 120 [P.O.:120] Out: -   Labs:  Basename 06/26/12 0500 06/25/12 0444  HGB 9.5* 10.1*    Basename 06/26/12 0500 06/25/12 0444  WBC 11.3* 11.2*  RBC 3.08* 3.27*  HCT 28.0* 29.9*  PLT 260 267    Basename 06/26/12 0500 06/25/12 0444  NA 132* 134*  K 3.9 4.2  CL 98 99  CO2 24 27  BUN 8 8  CREATININE 0.67 0.71  GLUCOSE 128* 121*  CALCIUM 8.6 8.6   No results found for this basename: LABPT:2,INR:2 in the last 72 hours  EXAM General - Patient is Alert, Appropriate and Oriented Extremity - Neurovascular intact Sensation intact distally Dorsiflexion/Plantar flexion intact No cellulitis present Dressing/Incision - clean, dry, no drainage, healing Motor Function - intact, moving foot and toes well on exam.   Past Medical History  Diagnosis Date  . Complication of anesthesia     severe diarrhea and vomiting after hysterectomy   . PONV (postoperative nausea and vomiting)   . Hypertension   . Dysrhythmia     [facility] - > 1 year ago   . Anxiety   . GERD (gastroesophageal reflux disease)   . Arthritis   . Anemia     hx of   . Chronic kidney disease     hx of uti , hx of stress incontinence       Assessment/Plan: 2 Days Post-Op Procedure(s) (LRB): TOTAL HIP ARTHROPLASTY (Left) Principal Problem:  *OA (osteoarthritis) of hip Active Problems:  Postop Hyponatremia  Postop Acute blood loss anemia  Estimated Body mass index is 44.12 kg/(m^2) as calculated from the following:   Height as of this encounter: 5\' 0" (1.524 m).   Weight as of this encounter: 225 lb 14.4 oz(102.468 kg). Up with therapy Plan for discharge tomorrow Discharge to SNF  DVT Prophylaxis - Xarelto Weight Bearing As Tolerated left Leg  PERKINS, ALEXZANDREW 06/26/2012, 11:14 AM

## 2012-06-26 NOTE — Progress Notes (Addendum)
RRTcalled to room 1616 for tachycardia. Pt sitting up in chair. Asymptomatic. RN states when patient got up her heart rate increased to 120's. EKG showed ST depression in AVL lead and ST elevation in Lead 3. In comparison to old EKG there was not much difference. EDP, Dr. Talbot Grumbling,  called to confirm EKG findings. BP remained stable at 120-126/60-80, HR 118-124. Once pt stood to use BSC BP increased to 150/115 with HR up to 126. Patient never experienced chest pain/tightness or dyspnea at any time. I reassured patient that I felt it was increased activity that made her "work" hard and increase her heart rate.  Patient stated, "This is more than I do at home. I don't do anything at home. This is hard surgery for a 71 year old to have." Instructed bedside RN to call MD and inform him of happenings/resolution and  if she needed any further Assistance or any questions to call RRT.

## 2012-06-26 NOTE — Progress Notes (Signed)
Pt's heart rate was elevated in the 120s with activity. Received order for EKG. EKG was suspicious. Pt reports no chest pain, SOB, indigestion, or other radiating pain on assessment. Rapid response nurse called to assess patient. PA paged. No new orders at this time. Will continue to monitor patient.

## 2012-06-26 NOTE — Progress Notes (Signed)
Physical Therapy Treatment Patient Details Name: Debra Griffith MRN: [id number] DOB: April 12, 1941 Today's Date: 06/26/2012 Time: 1131-1207 PT Time Calculation (min): 36 min  PT Assessment / Plan / Recommendation Comments on Treatment Session       Follow Up Recommendations  Post acute inpatient     Does the patient have the potential to tolerate intense rehabilitation  No, Recommend SNF  Barriers to Discharge        Equipment Recommendations  3 in 1 bedside comode;Rolling walker with 5" wheels    Recommendations for Other Services OT consult  Frequency 7X/week   Plan Discharge plan remains appropriate    Precautions / Restrictions Precautions Precautions: Posterior Hip Precaution Comments: Pt recalls 1/3 THP without cues Restrictions Weight Bearing Restrictions: No LLE Weight Bearing: Weight bearing as tolerated   Pertinent Vitals/Pain 4/10; premedicated; ice packs provided    Mobility  Bed Mobility Bed Mobility: Supine to Sit Supine to Sit: 3: Mod assist Details for Bed Mobility Assistance: cues for sequence, adherence to THP and use of UEs to assist.  Assist with L LE and to bring trunk upright Transfers Transfers: Sit to Stand;Stand to Sit Sit to Stand: 3: Mod assist Stand to Sit: 3: Mod assist Details for Transfer Assistance: cues needed for hand placement and RLE management. Ambulation/Gait Ambulation/Gait Assistance: 3: Mod assist;4: Min Environmental consultant (Feet): 22 Feet Assistive device: Rolling walker Ambulation/Gait Assistance Details: cues for sequence, posture, position from RW and ER on L Gait Pattern: Step-to pattern Gait velocity: slow General Gait Details: ltd by c/o dizziness - BP 124/79 with HR 125 - RN aware    Exercises Total Joint Exercises Ankle Circles/Pumps: AROM;Supine;Both;20 reps Quad Sets: AROM;Both;10 reps;Supine Gluteal Sets: AROM;Supine;Both;20 reps Heel Slides: AAROM;Supine;Left;20 reps Hip ABduction/ADduction:  AAROM;Supine;Left;20 reps   PT Diagnosis:    PT Problem List:   PT Treatment Interventions:     PT Goals Acute Rehab PT Goals PT Goal Formulation: With patient Time For Goal Achievement: 06/30/12 Potential to Achieve Goals: Good Pt will go Supine/Side to Sit: with min assist PT Goal: Supine/Side to Sit - Progress: Progressing toward goal Pt will go Sit to Supine/Side: with min assist PT Goal: Sit to Supine/Side - Progress: Progressing toward goal Pt will go Sit to Stand: with supervision PT Goal: Sit to Stand - Progress: Progressing toward goal Pt will go Stand to Sit: with supervision PT Goal: Stand to Sit - Progress: Progressing toward goal Pt will Ambulate: 51 - 150 feet;with supervision;with rolling walker PT Goal: Ambulate - Progress: Progressing toward goal  Visit Information  Last PT Received On: 06/26/12 Assistance Needed: +1    Subjective Data  Subjective: I;m doinggood, leaving tomorrow Patient Stated Goal: Resume previous lifestyle   Cognition  Overall Cognitive Status: Appears within functional limits for tasks assessed/performed Arousal/Alertness: Awake/alert Orientation Level: Appears intact for tasks assessed Behavior During Session: [facility] for tasks performed    Balance     End of Session PT - End of Session Equipment Utilized During Treatment: Gait belt Activity Tolerance: Patient limited by fatigue Patient left: in chair;with call bell/phone within reach;with family/visitor present Nurse Communication: Mobility status   GP     Debra Griffith 06/26/2012, 1:12 PM

## 2012-06-27 DIAGNOSIS — I10 Essential (primary) hypertension: Secondary | ICD-10-CM | POA: Diagnosis not present

## 2012-06-27 DIAGNOSIS — G4700 Insomnia, unspecified: Secondary | ICD-10-CM | POA: Diagnosis not present

## 2012-06-27 DIAGNOSIS — M1990 Unspecified osteoarthritis, unspecified site: Secondary | ICD-10-CM | POA: Diagnosis not present

## 2012-06-27 DIAGNOSIS — Z96649 Presence of unspecified artificial hip joint: Secondary | ICD-10-CM | POA: Diagnosis not present

## 2012-06-27 DIAGNOSIS — S79919A Unspecified injury of unspecified hip, initial encounter: Secondary | ICD-10-CM | POA: Diagnosis not present

## 2012-06-27 DIAGNOSIS — M1610 Unilateral primary osteoarthritis, unspecified hip: Secondary | ICD-10-CM | POA: Diagnosis not present

## 2012-06-27 DIAGNOSIS — R000 Tachycardia, unspecified: Secondary | ICD-10-CM

## 2012-06-27 DIAGNOSIS — F411 Generalized anxiety disorder: Secondary | ICD-10-CM | POA: Diagnosis not present

## 2012-06-27 DIAGNOSIS — Z5189 Encounter for other specified aftercare: Secondary | ICD-10-CM | POA: Diagnosis not present

## 2012-06-27 DIAGNOSIS — D62 Acute posthemorrhagic anemia: Secondary | ICD-10-CM | POA: Diagnosis not present

## 2012-06-27 DIAGNOSIS — N189 Chronic kidney disease, unspecified: Secondary | ICD-10-CM | POA: Diagnosis not present

## 2012-06-27 DIAGNOSIS — M25559 Pain in unspecified hip: Secondary | ICD-10-CM | POA: Diagnosis not present

## 2012-06-27 HISTORY — DX: Tachycardia, unspecified: R00.0

## 2012-06-27 LAB — CBC
HCT: 29.7 % — ABNORMAL LOW (ref 36.0–46.0)
Hemoglobin: 9.9 g/dL — ABNORMAL LOW (ref 12.0–15.0)
MCHC: 33.3 g/dL (ref 30.0–36.0)
RBC: 3.25 MIL/uL — ABNORMAL LOW (ref 3.87–5.11)

## 2012-06-27 MED ORDER — OXYCODONE HCL 5 MG PO TABS: 5.0000 mg | ORAL_TABLET | ORAL | Status: DC | PRN

## 2012-06-27 MED ORDER — RIVAROXABAN 10 MG PO TABS: 10.0000 mg | ORAL_TABLET | Freq: Every day | ORAL | Status: DC

## 2012-06-27 MED ORDER — ONDANSETRON HCL 4 MG PO TABS: 4.0000 mg | ORAL_TABLET | Freq: Four times a day (QID) | ORAL | Status: DC | PRN

## 2012-06-27 MED ORDER — TRAMADOL HCL 50 MG PO TABS: 50.0000 mg | ORAL_TABLET | Freq: Four times a day (QID) | ORAL | Status: DC | PRN

## 2012-06-27 MED ORDER — POLYETHYLENE GLYCOL 3350 17 G PO PACK: 17.0000 g | PACK | Freq: Every day | ORAL | Status: DC | PRN

## 2012-06-27 MED ORDER — ACETAMINOPHEN 325 MG PO TABS: 650.0000 mg | ORAL_TABLET | Freq: Four times a day (QID) | ORAL | Status: DC | PRN

## 2012-06-27 MED ORDER — DSS 100 MG PO CAPS: 100.0000 mg | ORAL_CAPSULE | Freq: Two times a day (BID) | ORAL | Status: DC

## 2012-06-27 MED ORDER — BISACODYL 10 MG RE SUPP: 10.0000 mg | Freq: Every day | RECTAL | Status: DC | PRN

## 2012-06-27 MED ORDER — METHOCARBAMOL 500 MG PO TABS: 500.0000 mg | ORAL_TABLET | Freq: Four times a day (QID) | ORAL | Status: DC | PRN

## 2012-06-27 NOTE — Progress Notes (Signed)
Physical Therapy Treatment Patient Details Name: Debra Griffith MRN: [id number] DOB: Mar 05, 1941 Today's Date: 06/27/2012 Time: 1003-1050 PT Time Calculation (min): 47 min  PT Assessment / Plan / Recommendation Comments on Treatment Session       Follow Up Recommendations  Post acute inpatient     Does the patient have the potential to tolerate intense rehabilitation  No, Recommend SNF  Barriers to Discharge        Equipment Recommendations  3 in 1 bedside comode;Rolling walker with 5" wheels    Recommendations for Other Services OT consult  Frequency 7X/week   Plan Discharge plan remains appropriate    Precautions / Restrictions Precautions Precautions: Posterior Hip Precaution Comments: Pt recalls all THP Restrictions Weight Bearing Restrictions: No LLE Weight Bearing: Weight bearing as tolerated   Pertinent Vitals/Pain 7/10 with amb; pt premedicated    Mobility  Bed Mobility Bed Mobility: Supine to Sit;Sit to Supine Supine to Sit: 3: Mod assist Sit to Supine: 3: Mod assist Details for Bed Mobility Assistance: cues for sequence, adherence to THP and use of UEs to assist.  Assist with L LE and to bring trunk upright.  Increased time and cues to shift to center of bed utilizing R LE, UEs and assist with pad Transfers Transfers: Sit to Stand;Stand to Sit Sit to Stand: 4: Min assist;From elevated surface;With upper extremity assist;From bed Stand to Sit: 4: Min assist;To bed;To elevated surface Details for Transfer Assistance: cues needed for hand placement and RLE management. Ambulation/Gait Ambulation/Gait Assistance: 4: Min assist Ambulation Distance (Feet): 82 Feet Assistive device: Rolling walker Ambulation/Gait Assistance Details: cues for posture and position from RW Gait Pattern: Step-to pattern Gait velocity: slow    Exercises Total Joint Exercises Ankle Circles/Pumps: AROM;Supine;Both;20 reps Quad Sets: AROM;Both;Supine;20 reps Gluteal Sets:  AROM;Supine;Both;20 reps Heel Slides: AAROM;Supine;Left;20 reps Hip ABduction/ADduction: AAROM;Supine;Left;20 reps   PT Diagnosis:    PT Problem List:   PT Treatment Interventions:     PT Goals Acute Rehab PT Goals PT Goal Formulation: With patient Time For Goal Achievement: 06/30/12 Potential to Achieve Goals: Good Pt will go Supine/Side to Sit: with min assist PT Goal: Supine/Side to Sit - Progress: Progressing toward goal Pt will go Sit to Supine/Side: with min assist PT Goal: Sit to Supine/Side - Progress: Progressing toward goal Pt will go Sit to Stand: with supervision PT Goal: Sit to Stand - Progress: Progressing toward goal Pt will go Stand to Sit: with supervision PT Goal: Stand to Sit - Progress: Progressing toward goal Pt will Ambulate: 51 - 150 feet;with supervision;with rolling walker PT Goal: Ambulate - Progress: Progressing toward goal  Visit Information  Last PT Received On: 06/27/12 Assistance Needed: +1    Subjective Data  Subjective: I'm doing much better, I slept last night Patient Stated Goal: Resume previous lifestyle   Cognition  Overall Cognitive Status: Appears within functional limits for tasks assessed/performed Arousal/Alertness: Awake/alert Orientation Level: Appears intact for tasks assessed Behavior During Session: [facility] for tasks performed    Balance     End of Session PT - End of Session Equipment Utilized During Treatment: Gait belt Activity Tolerance: Patient tolerated treatment well Patient left: in bed;with call bell/phone within reach;with family/visitor present Nurse Communication: Mobility status   GP     Sharmaine Bain 06/27/2012, 12:25 PM

## 2012-06-27 NOTE — Discharge Summary (Signed)
Physician Discharge Summary   Patient ID: Debra Griffith MRN: [id number] DOB/AGE: 1941-04-25 71 y.o.  Admit date: 06/24/2012 Discharge date: 06/27/2012  Primary Diagnosis: Osteoarthritis Left hip   Admission Diagnoses:  Past Medical History  Diagnosis Date  . Complication of anesthesia     severe diarrhea and vomiting after hysterectomy   . PONV (postoperative nausea and vomiting)   . Hypertension   . Dysrhythmia     [facility] - > 1 year ago   . Anxiety   . GERD (gastroesophageal reflux disease)   . Arthritis   . Anemia     hx of   . Chronic kidney disease     hx of uti , hx of stress incontinence    Discharge Diagnoses:   Principal Problem:  *OA (osteoarthritis) of hip Active Problems:  Postop Hyponatremia  Postop Acute blood loss anemia  Postop Sinus tachycardia  Estimated Body mass index is 44.12 kg/(m^2) as calculated from the following:   Height as of this encounter: 5\' 0" (1.524 m).   Weight as of this encounter: 225 lb 14.4 oz(102.468 kg).  Classification of overweight in adults according to BMI (WHO, 1998)   Procedure: Procedure(s) (LRB): TOTAL HIP ARTHROPLASTY (Left)   Consults: None  HPI: Debra Griffith is a 71 y.o. female with end stage arthritis of her left hip with progressively worsening pain and dysfunction. Pain occurs with activity and rest including pain at night. She has tried analgesics, protected weight bearing and rest without benefit. Pain is too severe to attempt physical therapy. Radiographs demonstrate bone on bone arthritis with subchondral cyst formation. She presents now for left THA.  Laboratory Data: Hospital Outpatient Visit on 06/18/2012  Component Date Value Range Status  . MRSA, PCR 06/18/2012 NEGATIVE  NEGATIVE Final  . Staphylococcus aureus 06/18/2012 NEGATIVE  NEGATIVE Final   Comment:                                 The Xpert SA Assay (FDA                          approved for NASAL specimens                          in  patients over 20 years of age),                          is one component of                          a comprehensive surveillance                          program.  Test performance has                          been validated by [employer] for patients greater                          than or equal to 67 year old.  It is not intended                          to diagnose infection nor to                          guide or monitor treatment.  . WBC 06/18/2012 6.8  4.0 - 10.5 K/uL Final  . RBC 06/18/2012 4.12  3.87 - 5.11 MIL/uL Final  . Hemoglobin 06/18/2012 12.6  12.0 - 15.0 g/dL Final  . HCT 96/12/5407 37.6  36.0 - 46.0 % Final  . MCV 06/18/2012 91.3  78.0 - 100.0 fL Final  . MCH 06/18/2012 30.6  26.0 - 34.0 pg Final  . MCHC 06/18/2012 33.5  30.0 - 36.0 g/dL Final  . RDW 81/19/1478 13.8  11.5 - 15.5 % Final  . Platelets 06/18/2012 298  150 - 400 K/uL Final  . aPTT 06/18/2012 27  24 - 37 seconds Final  . Prothrombin Time 06/18/2012 13.1  11.6 - 15.2 seconds Final  . INR 06/18/2012 1.00  0.00 - 1.49 Final  . Sodium 06/18/2012 135  135 - 145 mEq/L Final  . Potassium 06/18/2012 3.7  3.5 - 5.1 mEq/L Final  . Chloride 06/18/2012 98  96 - 112 mEq/L Final  . CO2 06/18/2012 26  19 - 32 mEq/L Final  . Glucose, Bld 06/18/2012 100* 70 - 99 mg/dL Final  . BUN 29/56/2130 18  6 - 23 mg/dL Final  . Creatinine, Ser 06/18/2012 0.99  0.50 - 1.10 mg/dL Final  . Calcium 86/57/8469 9.4  8.4 - 10.5 mg/dL Final  . Total Protein 06/18/2012 6.6  6.0 - 8.3 g/dL Final  . Albumin 62/95/2841 3.7  3.5 - 5.2 g/dL Final  . AST 32/44/0102 15  0 - 37 U/L Final  . ALT 06/18/2012 15  0 - 35 U/L Final  . Alkaline Phosphatase 06/18/2012 73  39 - 117 U/L Final  . Total Bilirubin 06/18/2012 0.2* 0.3 - 1.2 mg/dL Final  . GFR calc non Af Amer 06/18/2012 56* >90 mL/min Final  . GFR calc Af Amer 06/18/2012 65* >90 mL/min Final   Comment:                                 The  eGFR has been calculated                          using the CKD EPI equation.                          This calculation has not been                          validated in all clinical                          situations.                          eGFR's persistently                          <90 mL/min signify  possible Chronic Kidney Disease.  . Color, Urine 06/18/2012 YELLOW  YELLOW Final  . APPearance 06/18/2012 CLEAR  CLEAR Final  . Specific Gravity, Urine 06/18/2012 1.013  1.005 - 1.030 Final  . pH 06/18/2012 6.5  5.0 - 8.0 Final  . Glucose, UA 06/18/2012 NEGATIVE  NEGATIVE mg/dL Final  . Hgb urine dipstick 06/18/2012 NEGATIVE  NEGATIVE Final  . Bilirubin Urine 06/18/2012 NEGATIVE  NEGATIVE Final  . Ketones, ur 06/18/2012 NEGATIVE  NEGATIVE mg/dL Final  . Protein, ur 16/06/9603 NEGATIVE  NEGATIVE mg/dL Final  . Urobilinogen, UA 06/18/2012 0.2  0.0 - 1.0 mg/dL Final  . Nitrite 54/05/8118 NEGATIVE  NEGATIVE Final  . Leukocytes, UA 06/18/2012 LARGE* NEGATIVE Final  . Squamous Epithelial / LPF 06/18/2012 FEW* RARE Final  . WBC, UA 06/18/2012 7-10  <3 WBC/hpf Final  . Bacteria, UA 06/18/2012 FEW* RARE Final    Basename 06/27/12 0435 06/26/12 0500 06/25/12 0444  HGB 9.9* 9.5* 10.1*    Basename 06/27/12 0435 06/26/12 0500  WBC 11.4* 11.3*  RBC 3.25* 3.08*  HCT 29.7* 28.0*  PLT 290 260    Basename 06/26/12 0500 06/25/12 0444  NA 132* 134*  K 3.9 4.2  CL 98 99  CO2 24 27  BUN 8 8  CREATININE 0.67 0.71  GLUCOSE 128* 121*  CALCIUM 8.6 8.6   No results found for this basename: LABPT:2,INR:2 in the last 72 hours  X-Rays:Dg Chest 2 View  06/18/2012  *RADIOLOGY REPORT*  Clinical Data: Preop for left hip surgery, hypertension  CHEST - 2 VIEW  Comparison: Chest x-ray of 02/26/2011  Findings: The lungs are clear.  Cardiomegaly is stable.  There are degenerative changes throughout the lower thoracic spine.  IMPRESSION: Stable cardiomegaly.  No active lung  disease.   Original Report Authenticated By: Juline Patch, M.D.    Dg Hip Complete Left  06/18/2012  *RADIOLOGY REPORT*  Clinical Data: Preop for left total hip replacement, diffuse left hip pain  LEFT HIP - COMPLETE 2+ VIEW  Comparison: None.  Findings: There is significant degenerative joint disease of the left hip with almost complete loss of joint space superiorly with sclerosis and spurring.  No acute bony abnormality is seen.  The right hip is unremarkable.  The pelvic rami are intact.  The SI joints are corticated.  IMPRESSION: Significant degenerative joint disease of the left hip.  No acute abnormality.   Original Report Authenticated By: Juline Patch, M.D.    Dg Pelvis Portable  06/24/2012  *RADIOLOGY REPORT*  Clinical Data: Postop left hip replacement  PORTABLE PELVIS  Comparison: 06/18/2012  Findings: Total left hip arthroplasty in satisfactory position and alignment.  No fracture or complication.  IMPRESSION: Satisfactory left hip replacement without acute abnormality.   Original Report Authenticated By: Camelia Phenes, M.D.    Dg Hip Portable 1 View Left  06/24/2012  *RADIOLOGY REPORT*  Clinical Data: Left hip replacement  PORTABLE LEFT HIP - 1 VIEW  Comparison: 06/18/2012  Findings: Satisfactory left hip replacement with normal alignment and no fracture.  IMPRESSION: Satisfactory left hip replacement.   Original Report Authenticated By: Camelia Phenes, M.D.     EKG: Orders placed during the hospital encounter of 06/24/12  . EKG  . EKG 12-LEAD  . EKG 12-LEAD  . EKG 12-LEAD  . EKG 12-LEAD     Hospital Course:  Patient was admitted to [facility] and taken to the OR and underwent the above state procedure without complications.  Patient tolerated the procedure  well and was later transferred to the recovery room and then to the orthopaedic floor for postoperative care.  They were given PO and IV analgesics for pain control following their surgery.  They were given 24 hours  of postoperative antibiotics of  Anti-infectives     Start     Dose/Rate Route Frequency Ordered Stop   06/24/12 1930   ceFAZolin (ANCEF) IVPB 1 g/50 mL premix        1 g 100 mL/hr over 30 Minutes Intravenous Every 6 hours 06/24/12 1641 06/25/12 0300   06/24/12 1010   ceFAZolin (ANCEF) IVPB 2 g/50 mL premix        2 g 100 mL/hr over 30 Minutes Intravenous 60 min pre-op 06/24/12 1010 06/24/12 1326         and started on DVT prophylaxis in the form of Xarelto.   PT and OT were ordered for total hip protocol.  The patient was allowed to be WBAT with therapy. Discharge planning was consulted to help with postop disposition and equipment needs.  Patient had a great night on the evening of surgery and started to get up OOB with therapy on day one.  Hemovac drain was pulled without difficulty.  The knee immobilizer was removed and discontinued.  Continued to work with therapy into day two and was walking fairly well. The patients heart rate was elevated in the 120s following therapy. EKG was done and was questionable for changes. Rapid response nurse was called by the hospital staff to assess patient. She denied any symptoms at that time - no SOB, no CP, no nausea, no vomiting. Rapid Response RN came up - RRTcalled to room 1616 for tachycardia. Pt sitting up in chair. Asymptomatic. RN states when patient got up her heart rate increased to 120's. EKG showed ST depression in AVL lead and ST elevation in Lead 3. In comparison to old EKG there was not much difference. EDP, Dr. Talbot Grumbling, called to confirm EKG findings. BP remained stable at 120-126/60-80, HR 118-124. Once pt stood to use BSC BP increased to 150/115 with HR up to 126. Patient never experienced chest pain/tightness or dyspnea at any time. I reassured patient that I felt it was increased activity that made her "work" hard and increase her heart rate. Patient stated, "This is more than I do at home. I don't do anything at home. This is hard surgery for  a 71 year old to have." Instructed bedside RN to call MD and inform him of happenings/resolution and if she needed any further Assistance or any questions to call RRT.  Dressing was changed on day two and the incision was healing well.  By day three, the patient was feeling much better today. She had no complaints. Felt wonderful. She had progressed with therapy and meeting their goals.  Incision was healing well.  Patient was seen in rounds and was ready to go the SNF.  Discharge Medications: Prior to Admission medications   Medication Sig Start Date End Date Taking? Authorizing Provider  amLODipine-valsartan (EXFORGE) 10-320 MG per tablet Take 1 tablet by mouth daily before breakfast.   Yes Historical Provider, MD  clonazePAM (KLONOPIN) 1 MG tablet Take 1 mg by mouth at bedtime.   Yes Historical Provider, MD  ferrous sulfate 325 (65 FE) MG tablet Take 325 mg by mouth daily with breakfast.   Yes Historical Provider, MD  metoprolol tartrate (LOPRESSOR) 25 MG tablet Take 50 mg by mouth every evening.   Yes Historical Provider, MD  potassium chloride SA (K-DUR,KLOR-CON) 20 MEQ tablet Take 20 mEq by mouth 2 (two) times daily.   Yes Historical Provider, MD  ranitidine (ZANTAC) 150 MG tablet Take 150 mg by mouth 2 (two) times daily.   Yes Historical Provider, MD  topiramate (TOPAMAX) 25 MG capsule Take 50 mg by mouth at bedtime.   Yes Historical Provider, MD  triamterene-hydrochlorothiazide (MAXZIDE-25) 37.5-25 MG per tablet Take 1 tablet by mouth daily before breakfast.   Yes Historical Provider, MD  zolpidem (AMBIEN) 10 MG tablet Take 10 mg by mouth at bedtime. Sleep   Yes Historical Provider, MD  acetaminophen (TYLENOL) 325 MG tablet Take 2 tablets (650 mg total) by mouth every 6 (six) hours as needed (or Fever >/= 101). 06/27/12   Norabelle Kondo Julien Girt, PA  albuterol (PROVENTIL HFA;VENTOLIN HFA) 108 (90 BASE) MCG/ACT inhaler Inhale 2 puffs into the lungs every 6 (six) hours as needed. Wheezing and  shortness of breath    Historical Provider, MD  bisacodyl (DULCOLAX) 10 MG suppository Place 1 suppository (10 mg total) rectally daily as needed. 06/27/12   Yadira Hada, PA  docusate sodium 100 MG CAPS Take 100 mg by mouth 2 (two) times daily. 06/27/12   Wasil Wolke Julien Girt, PA  methocarbamol (ROBAXIN) 500 MG tablet Take 1 tablet (500 mg total) by mouth every 6 (six) hours as needed. 06/27/12   Nachum Derossett, PA  ondansetron (ZOFRAN) 4 MG tablet Take 1 tablet (4 mg total) by mouth every 6 (six) hours as needed for nausea. 06/27/12   Mariesa Grieder, PA  oxyCODONE (OXY IR/ROXICODONE) 5 MG immediate release tablet Take 1-2 tablets (5-10 mg total) by mouth every 3 (three) hours as needed. 06/27/12   Ivon Oelkers, PA  polyethylene glycol (MIRALAX / GLYCOLAX) packet Take 17 g by mouth daily as needed. 06/27/12   Johann Santone Julien Girt, PA  rivaroxaban (XARELTO) 10 MG TABS tablet Take 1 tablet (10 mg total) by mouth daily with breakfast. Take Xarelto for two and a half more weeks, then discontinue Xarelto. 06/27/12   Bita Cartwright Julien Girt, PA  traMADol (ULTRAM) 50 MG tablet Take 1-2 tablets (50-100 mg total) by mouth every 6 (six) hours as needed (mild pain). 06/27/12   Karlon Schlafer Julien Girt, PA    Diet: Cardiac diet Activity:WBAT No bending hip over 90 degrees- A "L" Angle Do not cross legs Do not let foot roll inward When turning these patients a pillow should be placed between the patient's legs to prevent crossing. Patients should have the affected knee fully extended when trying to sit or stand from all surfaces to prevent excessive hip flexion. When ambulating and turning toward the affected side the affected leg should have the toes turned out prior to moving the walker and the rest of patient's body as to prevent internal rotation/ turning in of the leg. Abduction pillows are the most effective way to prevent a patient from not crossing legs or turning toes in at rest. If an  abduction pillow is not ordered placing a regular pillow length wise between the patient's legs is also an effective reminder. It is imperative that these precautions be maintained so that the surgical hip does not dislocate. Follow-up:in 2 weeks Disposition - Skilled nursing facility Discharged Condition: good       Discharge Orders    Future Orders Please Complete By Expires   Diet - low sodium heart healthy      Call MD / Call 911      Comments:   If you experience chest pain or  shortness of breath, CALL 911 and be transported to the hospital emergency room.  If you develope a fever above 101 F, pus (white drainage) or increased drainage or redness at the wound, or calf pain, call your surgeon's office.   Discharge instructions      Comments:   Pick up stool softner and laxative for home. Do not submerge incision under water. May shower. Continue to use ice for pain and swelling from surgery. Hip precautions.  Total Hip Protocol.  Take Xarelto for two and a half more weeks, then discontinue Xarelto.  When discharged from the skilled rehab facility, please have the facility set up the patient's Home Health Physical Therapy prior to being released.  Also provide the patient with their medications at time of release from the facility to include their pain medication, the muscle relaxants, and their blood thinner medication.  If the patient is still at the rehab facility at time of follow up appointment, please also assist the patient in arranging follow up appointment in our office and any transportation needs.   Constipation Prevention      Comments:   Drink plenty of fluids.  Prune juice may be helpful.  You may use a stool softener, such as Colace (over the counter) 100 mg twice a day.  Use MiraLax (over the counter) for constipation as needed.   Increase activity slowly as tolerated      Patient may shower      Comments:   You may shower without a dressing once there is no  drainage.  Do not wash over the wound.  If drainage remains, do not shower until drainage stops.   Weight bearing as tolerated      Driving restrictions      Comments:   No driving until released by the physician.   Lifting restrictions      Comments:   No lifting until released by the physician.   Follow the hip precautions as taught in Physical Therapy      Do not sit on low chairs, stoools or toilet seats, as it may be difficult to get up from low surfaces      Change dressing      Comments:   You may change your dressing dressing daily with sterile 4 x 4 inch gauze dressing and paper tape.  Do not submerge the incision under water.   TED hose      Comments:   Use stockings (TED hose) for 3 weeks on both leg(s).  You may remove them at night for sleeping.       Medication List     As of 06/27/2012  8:25 AM    STOP taking these medications         ascorbic acid 250 MG Chew   Commonly known as: VITAMIN C      CALTRATE 600 PO      co-enzyme Q-10 30 MG capsule      cyclobenzaprine 10 MG tablet   Commonly known as: FLEXERIL      GLUCOSAMINE 1500 COMPLEX PO      HYDROcodone-acetaminophen 5-325 MG per tablet   Commonly known as: NORCO/VICODIN      multivitamin with minerals Tabs      nabumetone 500 MG tablet   Commonly known as: RELAFEN      phentermine 15 MG capsule      Vitamin D 2000 UNITS tablet      TAKE these medications  acetaminophen 325 MG tablet   Commonly known as: TYLENOL   Take 2 tablets (650 mg total) by mouth every 6 (six) hours as needed (or Fever >/= 101).      albuterol 108 (90 BASE) MCG/ACT inhaler   Commonly known as: PROVENTIL HFA;VENTOLIN HFA   Inhale 2 puffs into the lungs every 6 (six) hours as needed. Wheezing and shortness of breath      amLODipine-valsartan 10-320 MG per tablet   Commonly known as: EXFORGE   Take 1 tablet by mouth daily before breakfast.      bisacodyl 10 MG suppository   Commonly known as: DULCOLAX    Place 1 suppository (10 mg total) rectally daily as needed.      clonazePAM 1 MG tablet   Commonly known as: KLONOPIN   Take 1 mg by mouth at bedtime.      DSS 100 MG Caps   Take 100 mg by mouth 2 (two) times daily.      ferrous sulfate 325 (65 FE) MG tablet   Take 325 mg by mouth daily with breakfast.      methocarbamol 500 MG tablet   Commonly known as: ROBAXIN   Take 1 tablet (500 mg total) by mouth every 6 (six) hours as needed.      metoprolol tartrate 25 MG tablet   Commonly known as: LOPRESSOR   Take 50 mg by mouth every evening.      ondansetron 4 MG tablet   Commonly known as: ZOFRAN   Take 1 tablet (4 mg total) by mouth every 6 (six) hours as needed for nausea.      oxyCODONE 5 MG immediate release tablet   Commonly known as: Oxy IR/ROXICODONE   Take 1-2 tablets (5-10 mg total) by mouth every 3 (three) hours as needed.      polyethylene glycol packet   Commonly known as: MIRALAX / GLYCOLAX   Take 17 g by mouth daily as needed.      potassium chloride SA 20 MEQ tablet   Commonly known as: K-DUR,KLOR-CON   Take 20 mEq by mouth 2 (two) times daily.      ranitidine 150 MG tablet   Commonly known as: ZANTAC   Take 150 mg by mouth 2 (two) times daily.      rivaroxaban 10 MG Tabs tablet   Commonly known as: XARELTO   Take 1 tablet (10 mg total) by mouth daily with breakfast. Take Xarelto for two and a half more weeks, then discontinue Xarelto.      topiramate 25 MG capsule   Commonly known as: TOPAMAX   Take 50 mg by mouth at bedtime.      traMADol 50 MG tablet   Commonly known as: ULTRAM   Take 1-2 tablets (50-100 mg total) by mouth every 6 (six) hours as needed (mild pain).      triamterene-hydrochlorothiazide 37.5-25 MG per tablet   Commonly known as: MAXZIDE-25   Take 1 tablet by mouth daily before breakfast.      zolpidem 10 MG tablet   Commonly known as: AMBIEN   Take 10 mg by mouth at bedtime. Sleep        Follow-up Information    Follow up  with Loanne Drilling, MD. Schedule an appointment as soon as possible for a visit in 2 weeks.   Contact information:   [address], [address] [place] [address] [phone]  SignedPatrica Duel 06/27/2012, 8:25 AM

## 2012-06-27 NOTE — Progress Notes (Signed)
Clinical Social Work Department CLINICAL SOCIAL WORK PLACEMENT NOTE 06/27/2012  Patient:  Debra Griffith, Debra Griffith  Account Number:  [id number] Admit date:  06/24/2012  Clinical Social Worker:  Cori Razor, LCSW  Date/time:  06/25/2012 01:37 PM  Clinical Social Work is seeking post-discharge placement for this patient at the following level of care:   SKILLED NURSING   (*CSW will update this form in Epic as items are completed)     Patient/family provided with [facility] Department of Clinical Social Work's list of facilities offering this level of care within the geographic area requested by the patient (or if unable, by the patient's family).    Patient/family informed of their freedom to choose among providers that offer the needed level of care, that participate in Medicare, Medicaid or managed care program needed by the patient, have an available bed and are willing to accept the patient.    Patient/family informed of [facility]' ownership interest in [facility], as well as of the fact that they are under no obligation to receive care at this facility.  PASARR submitted to [employer] on 06/25/2012 PASARR number received from [employer] on 06/25/2012  FL2 transmitted to all facilities in geographic area requested by pt/family on  06/25/2012 FL2 transmitted to all facilities within larger geographic area on   Patient informed that his/her managed care company has contracts with or will negotiate with  certain facilities, including the following:     Patient/family informed of bed offers received:  06/25/2012 Patient chooses bed at [facility] Physician recommends and patient chooses bed at    Patient to be transferred to [facility] on  06/27/2012 Patient to be transferred to facility by [facility]  The following physician request were entered in Epic:   Additional Comments:  Cori Razor LCSW [phone]

## 2012-06-27 NOTE — Progress Notes (Addendum)
Subjective: 3 Days Post-Op Procedure(s) (LRB): TOTAL HIP ARTHROPLASTY (Left) Patient reports pain as mild.   Patient seen in rounds with Dr. Lequita Halt.  Events of yesterday have been reviewed by Dr. Lequita Halt.  Yesterday afternoon, the patients heart rate was elevated in the 120s following therapy. EKG was done and was questionable for changes. Rapid response nurse was called by the hospital staff to assess patient. She denied any symptoms at that time - no SOB, no CP, no nausea, no vomiting.  Rapid Response RN came up - RRTcalled to room 1616 for tachycardia. Pt sitting up in chair. Asymptomatic. RN states when patient got up her heart rate increased to 120's. EKG showed ST depression in AVL lead and ST elevation in Lead 3. In comparison to old EKG there was not much difference. EDP, Dr. Talbot Grumbling, called to confirm EKG findings. BP remained stable at 120-126/60-80, HR 118-124. Once pt stood to use BSC BP increased to 150/115 with HR up to 126. Patient never experienced chest pain/tightness or dyspnea at any time. I reassured patient that I felt it was increased activity that made her "work" hard and increase her heart rate. Patient stated, "This is more than I do at home. I don't do anything at home. This is hard surgery for a 71 year old to have." Instructed bedside RN to call MD and inform him of happenings/resolution and if she needed any further Assistance or any questions to call RRT. Patient is feeling much better today.  She has no complaints.  Feels wonderful.   Patient is ready to go the SNF this morning.  Objective: Vital signs in last 24 hours: Temp:  [98.1 F (36.7 C)-99.7 F (37.6 C)] 98.1 F (36.7 C) (10/11 0450) Pulse Rate:  [86-120] 86  (10/11 0450) Resp:  [14-20] 20  (10/11 0450) BP: (111-125)/(62-77) 111/64 mmHg (10/11 0450) SpO2:  [94 %-100 %] 99 % (10/11 0450)  Intake/Output from previous day:  Intake/Output Summary (Last 24 hours) at 06/27/12 0825 Last data filed at  06/27/12 0436  Gross per 24 hour  Intake    600 ml  Output    250 ml  Net    350 ml    Intake/Output this shift:    Labs:  Basename 06/27/12 0435 06/26/12 0500 06/25/12 0444  HGB 9.9* 9.5* 10.1*    Basename 06/27/12 0435 06/26/12 0500  WBC 11.4* 11.3*  RBC 3.25* 3.08*  HCT 29.7* 28.0*  PLT 290 260    Basename 06/26/12 0500 06/25/12 0444  NA 132* 134*  K 3.9 4.2  CL 98 99  CO2 24 27  BUN 8 8  CREATININE 0.67 0.71  GLUCOSE 128* 121*  CALCIUM 8.6 8.6   No results found for this basename: LABPT:2,INR:2 in the last 72 hours  EXAM: General - Patient is Alert, Appropriate and Oriented Extremity - Neurovascular intact Sensation intact distally Dorsiflexion/Plantar flexion intact No cellulitis present Incision - clean, dry, no drainage, healing Motor Function - intact, moving foot and toes well on exam.   Assessment/Plan: 3 Days Post-Op Procedure(s) (LRB): TOTAL HIP ARTHROPLASTY (Left) Procedure(s) (LRB): TOTAL HIP ARTHROPLASTY (Left) Past Medical History  Diagnosis Date  . Complication of anesthesia     severe diarrhea and vomiting after hysterectomy   . PONV (postoperative nausea and vomiting)   . Hypertension   . Dysrhythmia     [facility] - > 1 year ago   . Anxiety   . GERD (gastroesophageal reflux disease)   . Arthritis   .  Anemia     hx of   . Chronic kidney disease     hx of uti , hx of stress incontinence    Principal Problem:  *OA (osteoarthritis) of hip Active Problems:  Postop Hyponatremia  Postop Acute blood loss anemia  Postop Sinus tachycardia  Estimated Body mass index is 44.12 kg/(m^2) as calculated from the following:   Height as of this encounter: 5\' 0" (1.524 m).   Weight as of this encounter: 225 lb 14.4 oz(102.468 kg). Discharge to SNF Diet - Cardiac diet Follow up - in 2 weeks Activity - WBAT Disposition - Skilled nursing facility Condition Upon Discharge - Good D/C Meds - See DC Summary DVT Prophylaxis - Xarelto  Analynn Daum,  [place] 06/27/2012, 8:25 AM

## 2012-07-02 DIAGNOSIS — D62 Acute posthemorrhagic anemia: Secondary | ICD-10-CM | POA: Diagnosis not present

## 2012-07-02 DIAGNOSIS — I10 Essential (primary) hypertension: Secondary | ICD-10-CM | POA: Diagnosis not present

## 2012-07-02 DIAGNOSIS — M1610 Unilateral primary osteoarthritis, unspecified hip: Secondary | ICD-10-CM | POA: Diagnosis not present

## 2012-07-02 DIAGNOSIS — F411 Generalized anxiety disorder: Secondary | ICD-10-CM | POA: Diagnosis not present

## 2012-07-15 DIAGNOSIS — I10 Essential (primary) hypertension: Secondary | ICD-10-CM | POA: Diagnosis not present

## 2012-07-15 DIAGNOSIS — Z5189 Encounter for other specified aftercare: Secondary | ICD-10-CM | POA: Diagnosis not present

## 2012-07-15 DIAGNOSIS — Z471 Aftercare following joint replacement surgery: Secondary | ICD-10-CM | POA: Diagnosis not present

## 2012-07-15 DIAGNOSIS — Z96649 Presence of unspecified artificial hip joint: Secondary | ICD-10-CM | POA: Diagnosis not present

## 2012-07-15 DIAGNOSIS — G894 Chronic pain syndrome: Secondary | ICD-10-CM | POA: Diagnosis not present

## 2012-07-17 DIAGNOSIS — Z96649 Presence of unspecified artificial hip joint: Secondary | ICD-10-CM | POA: Diagnosis not present

## 2012-07-17 DIAGNOSIS — G894 Chronic pain syndrome: Secondary | ICD-10-CM | POA: Diagnosis not present

## 2012-07-17 DIAGNOSIS — I10 Essential (primary) hypertension: Secondary | ICD-10-CM | POA: Diagnosis not present

## 2012-07-17 DIAGNOSIS — Z471 Aftercare following joint replacement surgery: Secondary | ICD-10-CM | POA: Diagnosis not present

## 2012-07-17 DIAGNOSIS — Z5189 Encounter for other specified aftercare: Secondary | ICD-10-CM | POA: Diagnosis not present

## 2012-07-18 DIAGNOSIS — Z5189 Encounter for other specified aftercare: Secondary | ICD-10-CM | POA: Diagnosis not present

## 2012-07-18 DIAGNOSIS — I10 Essential (primary) hypertension: Secondary | ICD-10-CM | POA: Diagnosis not present

## 2012-07-18 DIAGNOSIS — G894 Chronic pain syndrome: Secondary | ICD-10-CM | POA: Diagnosis not present

## 2012-07-18 DIAGNOSIS — Z96649 Presence of unspecified artificial hip joint: Secondary | ICD-10-CM | POA: Diagnosis not present

## 2012-07-18 DIAGNOSIS — Z471 Aftercare following joint replacement surgery: Secondary | ICD-10-CM | POA: Diagnosis not present

## 2012-07-21 DIAGNOSIS — G894 Chronic pain syndrome: Secondary | ICD-10-CM | POA: Diagnosis not present

## 2012-07-21 DIAGNOSIS — I10 Essential (primary) hypertension: Secondary | ICD-10-CM | POA: Diagnosis not present

## 2012-07-21 DIAGNOSIS — Z96649 Presence of unspecified artificial hip joint: Secondary | ICD-10-CM | POA: Diagnosis not present

## 2012-07-21 DIAGNOSIS — Z471 Aftercare following joint replacement surgery: Secondary | ICD-10-CM | POA: Diagnosis not present

## 2012-07-21 DIAGNOSIS — Z5189 Encounter for other specified aftercare: Secondary | ICD-10-CM | POA: Diagnosis not present

## 2012-07-23 DIAGNOSIS — I10 Essential (primary) hypertension: Secondary | ICD-10-CM | POA: Diagnosis not present

## 2012-07-23 DIAGNOSIS — G894 Chronic pain syndrome: Secondary | ICD-10-CM | POA: Diagnosis not present

## 2012-07-23 DIAGNOSIS — Z96649 Presence of unspecified artificial hip joint: Secondary | ICD-10-CM | POA: Diagnosis not present

## 2012-07-23 DIAGNOSIS — Z5189 Encounter for other specified aftercare: Secondary | ICD-10-CM | POA: Diagnosis not present

## 2012-07-23 DIAGNOSIS — Z471 Aftercare following joint replacement surgery: Secondary | ICD-10-CM | POA: Diagnosis not present

## 2012-07-25 DIAGNOSIS — G894 Chronic pain syndrome: Secondary | ICD-10-CM | POA: Diagnosis not present

## 2012-07-25 DIAGNOSIS — I10 Essential (primary) hypertension: Secondary | ICD-10-CM | POA: Diagnosis not present

## 2012-07-25 DIAGNOSIS — Z471 Aftercare following joint replacement surgery: Secondary | ICD-10-CM | POA: Diagnosis not present

## 2012-07-25 DIAGNOSIS — Z96649 Presence of unspecified artificial hip joint: Secondary | ICD-10-CM | POA: Diagnosis not present

## 2012-07-25 DIAGNOSIS — Z5189 Encounter for other specified aftercare: Secondary | ICD-10-CM | POA: Diagnosis not present

## 2012-07-28 DIAGNOSIS — Z96649 Presence of unspecified artificial hip joint: Secondary | ICD-10-CM | POA: Diagnosis not present

## 2012-07-28 DIAGNOSIS — I10 Essential (primary) hypertension: Secondary | ICD-10-CM | POA: Diagnosis not present

## 2012-07-28 DIAGNOSIS — Z5189 Encounter for other specified aftercare: Secondary | ICD-10-CM | POA: Diagnosis not present

## 2012-07-28 DIAGNOSIS — Z471 Aftercare following joint replacement surgery: Secondary | ICD-10-CM | POA: Diagnosis not present

## 2012-07-28 DIAGNOSIS — G894 Chronic pain syndrome: Secondary | ICD-10-CM | POA: Diagnosis not present

## 2012-07-29 DIAGNOSIS — I10 Essential (primary) hypertension: Secondary | ICD-10-CM | POA: Diagnosis not present

## 2012-07-29 DIAGNOSIS — Z96649 Presence of unspecified artificial hip joint: Secondary | ICD-10-CM | POA: Diagnosis not present

## 2012-07-29 DIAGNOSIS — G894 Chronic pain syndrome: Secondary | ICD-10-CM | POA: Diagnosis not present

## 2012-07-29 DIAGNOSIS — Z5189 Encounter for other specified aftercare: Secondary | ICD-10-CM | POA: Diagnosis not present

## 2012-07-29 DIAGNOSIS — Z471 Aftercare following joint replacement surgery: Secondary | ICD-10-CM | POA: Diagnosis not present

## 2012-07-30 DIAGNOSIS — M169 Osteoarthritis of hip, unspecified: Secondary | ICD-10-CM | POA: Diagnosis not present

## 2012-07-31 DIAGNOSIS — Z96649 Presence of unspecified artificial hip joint: Secondary | ICD-10-CM | POA: Diagnosis not present

## 2012-07-31 DIAGNOSIS — G894 Chronic pain syndrome: Secondary | ICD-10-CM | POA: Diagnosis not present

## 2012-07-31 DIAGNOSIS — Z5189 Encounter for other specified aftercare: Secondary | ICD-10-CM | POA: Diagnosis not present

## 2012-07-31 DIAGNOSIS — Z471 Aftercare following joint replacement surgery: Secondary | ICD-10-CM | POA: Diagnosis not present

## 2012-07-31 DIAGNOSIS — I10 Essential (primary) hypertension: Secondary | ICD-10-CM | POA: Diagnosis not present

## 2012-08-05 DIAGNOSIS — I10 Essential (primary) hypertension: Secondary | ICD-10-CM | POA: Diagnosis not present

## 2012-08-05 DIAGNOSIS — Z471 Aftercare following joint replacement surgery: Secondary | ICD-10-CM | POA: Diagnosis not present

## 2012-08-05 DIAGNOSIS — G894 Chronic pain syndrome: Secondary | ICD-10-CM | POA: Diagnosis not present

## 2012-08-05 DIAGNOSIS — Z5189 Encounter for other specified aftercare: Secondary | ICD-10-CM | POA: Diagnosis not present

## 2012-08-05 DIAGNOSIS — Z96649 Presence of unspecified artificial hip joint: Secondary | ICD-10-CM | POA: Diagnosis not present

## 2012-08-06 DIAGNOSIS — Z471 Aftercare following joint replacement surgery: Secondary | ICD-10-CM | POA: Diagnosis not present

## 2012-08-06 DIAGNOSIS — G894 Chronic pain syndrome: Secondary | ICD-10-CM | POA: Diagnosis not present

## 2012-08-06 DIAGNOSIS — I10 Essential (primary) hypertension: Secondary | ICD-10-CM | POA: Diagnosis not present

## 2012-08-06 DIAGNOSIS — Z5189 Encounter for other specified aftercare: Secondary | ICD-10-CM | POA: Diagnosis not present

## 2012-08-06 DIAGNOSIS — Z96649 Presence of unspecified artificial hip joint: Secondary | ICD-10-CM | POA: Diagnosis not present

## 2012-08-07 DIAGNOSIS — Z23 Encounter for immunization: Secondary | ICD-10-CM | POA: Diagnosis not present

## 2012-08-08 DIAGNOSIS — Z5189 Encounter for other specified aftercare: Secondary | ICD-10-CM | POA: Diagnosis not present

## 2012-08-08 DIAGNOSIS — G894 Chronic pain syndrome: Secondary | ICD-10-CM | POA: Diagnosis not present

## 2012-08-08 DIAGNOSIS — I10 Essential (primary) hypertension: Secondary | ICD-10-CM | POA: Diagnosis not present

## 2012-08-08 DIAGNOSIS — M1710 Unilateral primary osteoarthritis, unspecified knee: Secondary | ICD-10-CM | POA: Diagnosis not present

## 2012-08-08 DIAGNOSIS — Z96649 Presence of unspecified artificial hip joint: Secondary | ICD-10-CM | POA: Diagnosis not present

## 2012-08-08 DIAGNOSIS — Z471 Aftercare following joint replacement surgery: Secondary | ICD-10-CM | POA: Diagnosis not present

## 2012-10-14 DIAGNOSIS — E559 Vitamin D deficiency, unspecified: Secondary | ICD-10-CM | POA: Diagnosis not present

## 2012-10-14 DIAGNOSIS — M1990 Unspecified osteoarthritis, unspecified site: Secondary | ICD-10-CM | POA: Diagnosis not present

## 2012-10-14 DIAGNOSIS — G4700 Insomnia, unspecified: Secondary | ICD-10-CM | POA: Diagnosis not present

## 2012-10-14 DIAGNOSIS — E782 Mixed hyperlipidemia: Secondary | ICD-10-CM | POA: Diagnosis not present

## 2012-10-14 DIAGNOSIS — I10 Essential (primary) hypertension: Secondary | ICD-10-CM | POA: Diagnosis not present

## 2012-12-25 DIAGNOSIS — G894 Chronic pain syndrome: Secondary | ICD-10-CM | POA: Diagnosis not present

## 2012-12-25 DIAGNOSIS — M1710 Unilateral primary osteoarthritis, unspecified knee: Secondary | ICD-10-CM | POA: Diagnosis not present

## 2013-01-28 DIAGNOSIS — G894 Chronic pain syndrome: Secondary | ICD-10-CM | POA: Diagnosis not present

## 2013-02-11 DIAGNOSIS — M1710 Unilateral primary osteoarthritis, unspecified knee: Secondary | ICD-10-CM | POA: Diagnosis not present

## 2013-03-18 DIAGNOSIS — M1710 Unilateral primary osteoarthritis, unspecified knee: Secondary | ICD-10-CM | POA: Diagnosis not present

## 2013-03-24 DIAGNOSIS — M1710 Unilateral primary osteoarthritis, unspecified knee: Secondary | ICD-10-CM | POA: Diagnosis not present

## 2013-04-06 DIAGNOSIS — M1710 Unilateral primary osteoarthritis, unspecified knee: Secondary | ICD-10-CM | POA: Diagnosis not present

## 2013-04-12 DIAGNOSIS — N390 Urinary tract infection, site not specified: Secondary | ICD-10-CM | POA: Diagnosis not present

## 2013-04-19 DIAGNOSIS — R300 Dysuria: Secondary | ICD-10-CM | POA: Diagnosis not present

## 2013-04-28 DIAGNOSIS — M1710 Unilateral primary osteoarthritis, unspecified knee: Secondary | ICD-10-CM | POA: Diagnosis not present

## 2013-05-28 DIAGNOSIS — Z23 Encounter for immunization: Secondary | ICD-10-CM | POA: Diagnosis not present

## 2013-06-15 ENCOUNTER — Other Ambulatory Visit (HOSPITAL_COMMUNITY): Payer: Self-pay | Admitting: Internal Medicine

## 2013-06-15 DIAGNOSIS — Z1231 Encounter for screening mammogram for malignant neoplasm of breast: Secondary | ICD-10-CM

## 2013-06-17 ENCOUNTER — Ambulatory Visit (HOSPITAL_COMMUNITY)
Admission: RE | Admit: 2013-06-17 | Discharge: 2013-06-17 | Disposition: A | Discharge status: Home / Self Care | Payer: Medicare Other | Source: Ambulatory Visit | Attending: Internal Medicine | Admitting: Internal Medicine

## 2013-06-17 ENCOUNTER — Other Ambulatory Visit (HOSPITAL_COMMUNITY): Payer: Self-pay | Admitting: Internal Medicine

## 2013-06-17 DIAGNOSIS — Z1231 Encounter for screening mammogram for malignant neoplasm of breast: Secondary | ICD-10-CM | POA: Diagnosis not present

## 2013-06-19 DIAGNOSIS — M1710 Unilateral primary osteoarthritis, unspecified knee: Secondary | ICD-10-CM | POA: Diagnosis not present

## 2013-07-16 DIAGNOSIS — M1710 Unilateral primary osteoarthritis, unspecified knee: Secondary | ICD-10-CM | POA: Diagnosis not present

## 2013-07-22 DIAGNOSIS — Z1331 Encounter for screening for depression: Secondary | ICD-10-CM | POA: Diagnosis not present

## 2013-07-22 DIAGNOSIS — E559 Vitamin D deficiency, unspecified: Secondary | ICD-10-CM | POA: Diagnosis not present

## 2013-07-22 DIAGNOSIS — G4700 Insomnia, unspecified: Secondary | ICD-10-CM | POA: Diagnosis not present

## 2013-07-22 DIAGNOSIS — G894 Chronic pain syndrome: Secondary | ICD-10-CM | POA: Diagnosis not present

## 2013-07-22 DIAGNOSIS — I10 Essential (primary) hypertension: Secondary | ICD-10-CM | POA: Diagnosis not present

## 2013-07-22 DIAGNOSIS — F329 Major depressive disorder, single episode, unspecified: Secondary | ICD-10-CM | POA: Diagnosis not present

## 2013-07-22 DIAGNOSIS — E782 Mixed hyperlipidemia: Secondary | ICD-10-CM | POA: Diagnosis not present

## 2013-07-22 DIAGNOSIS — M1990 Unspecified osteoarthritis, unspecified site: Secondary | ICD-10-CM | POA: Diagnosis not present

## 2013-07-22 DIAGNOSIS — Z0000 Encounter for general adult medical examination without abnormal findings: Secondary | ICD-10-CM | POA: Diagnosis not present

## 2013-08-24 DIAGNOSIS — M1710 Unilateral primary osteoarthritis, unspecified knee: Secondary | ICD-10-CM | POA: Diagnosis not present

## 2013-09-21 DIAGNOSIS — M1710 Unilateral primary osteoarthritis, unspecified knee: Secondary | ICD-10-CM | POA: Diagnosis not present

## 2013-09-21 DIAGNOSIS — Z79899 Other long term (current) drug therapy: Secondary | ICD-10-CM | POA: Diagnosis not present

## 2013-09-21 DIAGNOSIS — M5137 Other intervertebral disc degeneration, lumbosacral region: Secondary | ICD-10-CM | POA: Diagnosis not present

## 2013-10-19 NOTE — Progress Notes (Signed)
Dr. Wynelle Link could you put orders in [facility] as patient has pre-op appointment on 02/06/2015t 11 am! Thank you!

## 2013-10-20 ENCOUNTER — Other Ambulatory Visit: Payer: Self-pay | Admitting: Orthopedic Surgery

## 2013-10-20 ENCOUNTER — Encounter (HOSPITAL_COMMUNITY): Payer: Self-pay | Admitting: Pharmacy Technician

## 2013-10-23 ENCOUNTER — Encounter (HOSPITAL_COMMUNITY): Payer: Self-pay

## 2013-10-23 ENCOUNTER — Emergency Department (HOSPITAL_COMMUNITY)
Admission: EM | Admit: 2013-10-23 | Discharge: 2013-10-23 | Disposition: A | Discharge status: Home / Self Care | Payer: Medicare Other | Attending: Emergency Medicine | Admitting: Emergency Medicine

## 2013-10-23 ENCOUNTER — Emergency Department (HOSPITAL_COMMUNITY): Payer: Medicare Other

## 2013-10-23 ENCOUNTER — Encounter (HOSPITAL_COMMUNITY): Payer: Self-pay | Admitting: Emergency Medicine

## 2013-10-23 ENCOUNTER — Encounter (HOSPITAL_COMMUNITY)
Admission: RE | Admit: 2013-10-23 | Discharge: 2013-10-23 | Disposition: A | Discharge status: Home / Self Care | Payer: Medicare Other | Source: Ambulatory Visit | Attending: Orthopedic Surgery | Admitting: Orthopedic Surgery

## 2013-10-23 DIAGNOSIS — Z79899 Other long term (current) drug therapy: Secondary | ICD-10-CM | POA: Insufficient documentation

## 2013-10-23 DIAGNOSIS — Z01810 Encounter for preprocedural cardiovascular examination: Secondary | ICD-10-CM | POA: Diagnosis not present

## 2013-10-23 DIAGNOSIS — Z01812 Encounter for preprocedural laboratory examination: Secondary | ICD-10-CM | POA: Diagnosis not present

## 2013-10-23 DIAGNOSIS — G4700 Insomnia, unspecified: Secondary | ICD-10-CM | POA: Diagnosis not present

## 2013-10-23 DIAGNOSIS — I4510 Unspecified right bundle-branch block: Secondary | ICD-10-CM | POA: Diagnosis not present

## 2013-10-23 DIAGNOSIS — Z8709 Personal history of other diseases of the respiratory system: Secondary | ICD-10-CM | POA: Insufficient documentation

## 2013-10-23 DIAGNOSIS — IMO0002 Reserved for concepts with insufficient information to code with codable children: Secondary | ICD-10-CM | POA: Diagnosis not present

## 2013-10-23 DIAGNOSIS — Z791 Long term (current) use of non-steroidal anti-inflammatories (NSAID): Secondary | ICD-10-CM | POA: Insufficient documentation

## 2013-10-23 DIAGNOSIS — E559 Vitamin D deficiency, unspecified: Secondary | ICD-10-CM | POA: Diagnosis not present

## 2013-10-23 DIAGNOSIS — Z01818 Encounter for other preprocedural examination: Secondary | ICD-10-CM | POA: Diagnosis not present

## 2013-10-23 DIAGNOSIS — F411 Generalized anxiety disorder: Secondary | ICD-10-CM | POA: Diagnosis not present

## 2013-10-23 DIAGNOSIS — K219 Gastro-esophageal reflux disease without esophagitis: Secondary | ICD-10-CM | POA: Diagnosis not present

## 2013-10-23 DIAGNOSIS — D649 Anemia, unspecified: Secondary | ICD-10-CM | POA: Insufficient documentation

## 2013-10-23 DIAGNOSIS — I10 Essential (primary) hypertension: Secondary | ICD-10-CM | POA: Diagnosis not present

## 2013-10-23 DIAGNOSIS — Z7982 Long term (current) use of aspirin: Secondary | ICD-10-CM | POA: Insufficient documentation

## 2013-10-23 DIAGNOSIS — E669 Obesity, unspecified: Secondary | ICD-10-CM | POA: Insufficient documentation

## 2013-10-23 DIAGNOSIS — M1710 Unilateral primary osteoarthritis, unspecified knee: Secondary | ICD-10-CM | POA: Insufficient documentation

## 2013-10-23 DIAGNOSIS — R9431 Abnormal electrocardiogram [ECG] [EKG]: Secondary | ICD-10-CM | POA: Diagnosis not present

## 2013-10-23 HISTORY — DX: Obesity, unspecified: E66.9

## 2013-10-23 HISTORY — DX: Insomnia, unspecified: G47.00

## 2013-10-23 HISTORY — DX: Other intervertebral disc degeneration, lumbar region: M51.36

## 2013-10-23 HISTORY — DX: Depression, unspecified: F32.A

## 2013-10-23 HISTORY — DX: Vitamin D deficiency, unspecified: E55.9

## 2013-10-23 HISTORY — DX: Other intervertebral disc degeneration, lumbar region without mention of lumbar back pain or lower extremity pain: M51.369

## 2013-10-23 HISTORY — DX: Major depressive disorder, single episode, unspecified: F32.9

## 2013-10-23 LAB — COMPREHENSIVE METABOLIC PANEL
ALK PHOS: 104 U/L (ref 39–117)
ALT: 19 U/L (ref 0–35)
ALT: 19 U/L (ref 0–35)
AST: 18 U/L (ref 0–37)
AST: 19 U/L (ref 0–37)
Albumin: 4.3 g/dL (ref 3.5–5.2)
Albumin: 4.3 g/dL (ref 3.5–5.2)
Alkaline Phosphatase: 103 U/L (ref 39–117)
BUN: 23 mg/dL (ref 6–23)
BUN: 23 mg/dL (ref 6–23)
CALCIUM: 9.7 mg/dL (ref 8.4–10.5)
CHLORIDE: 102 meq/L (ref 96–112)
CO2: 25 meq/L (ref 19–32)
CO2: 23 mEq/L (ref 19–32)
Calcium: 9.9 mg/dL (ref 8.4–10.5)
Chloride: 101 mEq/L (ref 96–112)
Creatinine, Ser: 1.15 mg/dL — ABNORMAL HIGH (ref 0.50–1.10)
Creatinine, Ser: 1.13 mg/dL — ABNORMAL HIGH (ref 0.50–1.10)
GFR calc Af Amer: 55 mL/min — ABNORMAL LOW (ref >90)
GFR calc non Af Amer: 48 mL/min — ABNORMAL LOW (ref >90)
GFR, EST AFRICAN AMERICAN: 54 mL/min — AB (ref >90)
GFR, EST NON AFRICAN AMERICAN: 47 mL/min — AB (ref >90)
GLUCOSE: 113 mg/dL — AB (ref 70–99)
GLUCOSE: 116 mg/dL — AB (ref 70–99)
POTASSIUM: 3.9 meq/L (ref 3.7–5.3)
Potassium: 3.4 mEq/L — ABNORMAL LOW (ref 3.7–5.3)
SODIUM: 138 meq/L (ref 137–147)
Sodium: 139 mEq/L (ref 137–147)
TOTAL PROTEIN: 7.4 g/dL (ref 6.0–8.3)
Total Bilirubin: 0.2 mg/dL — ABNORMAL LOW (ref 0.3–1.2)
Total Bilirubin: 0.2 mg/dL — ABNORMAL LOW (ref 0.3–1.2)
Total Protein: 7.3 g/dL (ref 6.0–8.3)

## 2013-10-23 LAB — URINALYSIS, ROUTINE W REFLEX MICROSCOPIC
BILIRUBIN URINE: NEGATIVE
Glucose, UA: NEGATIVE mg/dL
Hgb urine dipstick: NEGATIVE
KETONES UR: NEGATIVE mg/dL
Leukocytes, UA: NEGATIVE
Nitrite: NEGATIVE
Protein, ur: NEGATIVE mg/dL
SPECIFIC GRAVITY, URINE: 1.019 (ref 1.005–1.030)
UROBILINOGEN UA: 0.2 mg/dL (ref 0.0–1.0)
pH: 6 (ref 5.0–8.0)

## 2013-10-23 LAB — CBC WITH DIFFERENTIAL/PLATELET
Basophils Absolute: 0 10*3/uL (ref 0.0–0.1)
Basophils Relative: 0 % (ref 0–1)
EOS ABS: 0.3 10*3/uL (ref 0.0–0.7)
EOS PCT: 4 % (ref 0–5)
HCT: 36.5 % (ref 36.0–46.0)
Hemoglobin: 11.8 g/dL — ABNORMAL LOW (ref 12.0–15.0)
Lymphocytes Relative: 38 % (ref 12–46)
Lymphs Abs: 2.6 10*3/uL (ref 0.7–4.0)
MCH: 28.7 pg (ref 26.0–34.0)
MCHC: 32.3 g/dL (ref 30.0–36.0)
MCV: 88.8 fL (ref 78.0–100.0)
MONOS PCT: 8 % (ref 3–12)
Monocytes Absolute: 0.5 10*3/uL (ref 0.1–1.0)
Neutro Abs: 3.3 10*3/uL (ref 1.7–7.7)
Neutrophils Relative %: 50 % (ref 43–77)
PLATELETS: 293 10*3/uL (ref 150–400)
RBC: 4.11 MIL/uL (ref 3.87–5.11)
RDW: 13.7 % (ref 11.5–15.5)
WBC: 6.7 10*3/uL (ref 4.0–10.5)

## 2013-10-23 LAB — SURGICAL PCR SCREEN
MRSA, PCR: NEGATIVE
Staphylococcus aureus: NEGATIVE

## 2013-10-23 LAB — PROTIME-INR
INR: 1.02 (ref 0.00–1.49)
Prothrombin Time: 13.2 seconds (ref 11.6–15.2)

## 2013-10-23 LAB — TROPONIN I

## 2013-10-23 LAB — CBC
HEMATOCRIT: 38.6 % (ref 36.0–46.0)
HEMOGLOBIN: 12.8 g/dL (ref 12.0–15.0)
MCH: 29.7 pg (ref 26.0–34.0)
MCHC: 33.2 g/dL (ref 30.0–36.0)
MCV: 89.6 fL (ref 78.0–100.0)
Platelets: 304 10*3/uL (ref 150–400)
RBC: 4.31 MIL/uL (ref 3.87–5.11)
RDW: 13.7 % (ref 11.5–15.5)
WBC: 7 10*3/uL (ref 4.0–10.5)

## 2013-10-23 LAB — APTT: aPTT: 28 seconds (ref 24–37)

## 2013-10-23 NOTE — ED Provider Notes (Signed)
CSN: [id number]     Arrival date & time 10/23/13  1253 History   First MD Initiated Contact with Patient 10/23/13 1258     Chief Complaint  Patient presents with  . Irregular Heart Beat   (Consider location/radiation/quality/duration/timing/severity/associated sxs/prior Treatment) HPI Comments: Patient is a 73 year old female with history of osteoarthritis of both knees. She went to preop testing this morning for her preoperative studies. She had EKG performed there which read "acute MI". She was subsequently brought here for evaluation. The patient is having no chest pain, shortness of breath, nausea and states that she feels fine. She has no complaints.  The history is provided by the patient.    Past Medical History  Diagnosis Date  . Complication of anesthesia     severe diarrhea and vomiting after hysterectomy   . PONV (postoperative nausea and vomiting)   . Hypertension   . Dysrhythmia     [facility] - > 1 year ago   . Anxiety   . GERD (gastroesophageal reflux disease)   . Arthritis   . Anemia     hx of   . DDD (degenerative disc disease), lumbar   . Obesity   . Insomnia   . Depression   . H/O bronchitis   . Vitamin D deficiency    Past Surgical History  Procedure Laterality Date  . Cesarean section      x 3  . Wisdom tooth extraction  1970's    admitted to hospital for surgery  . Abdominal hysterectomy    . Total hip arthroplasty  06/24/2012    Procedure: TOTAL HIP ARTHROPLASTY;  Surgeon: [name], MD;  Location: [facility];  Service: Orthopedics;  Laterality: Left;   No family history on file. History  Substance Use Topics  . Smoking status: Never Smoker   . Smokeless tobacco: Never Used  . Alcohol Use: No   OB History   Grav Para Term Preterm Abortions TAB SAB Ect Mult Living                 Review of Systems  All other systems reviewed and are negative.    Allergies  Morphine and related  Home Medications   Current Outpatient Rx  Name  Route   Sig  Dispense  Refill  . albuterol (PROVENTIL HFA;VENTOLIN HFA) 108 (90 BASE) MCG/ACT inhaler   Inhalation   Inhale 2 puffs into the lungs every 6 (six) hours as needed. Wheezing and shortness of breath         . amLODipine-valsartan (EXFORGE) 10-320 MG per tablet   Oral   Take 1 tablet by mouth daily before breakfast.         . aspirin EC 81 MG tablet   Oral   Take 81 mg by mouth daily.         . cholecalciferol (VITAMIN D) 1000 UNITS tablet   Oral   Take 1,000 Units by mouth daily.         . clonazePAM (KLONOPIN) 1 MG tablet   Oral   Take 1 mg by mouth at bedtime.         . cyclobenzaprine (FLEXERIL) 10 MG tablet   Oral   Take 20 mg by mouth at bedtime.         . ferrous sulfate 325 (65 FE) MG tablet   Oral   Take 325 mg by mouth daily with breakfast.         . HYDROcodone-acetaminophen (NORCO) 10-325 MG per  tablet   Oral   Take 1 tablet by mouth every 6 (six) hours as needed for moderate pain.         . metoprolol tartrate (LOPRESSOR) 25 MG tablet   Oral   Take 50 mg by mouth at bedtime.          . Multiple Vitamin (MULTIVITAMIN WITH MINERALS) TABS tablet   Oral   Take 1 tablet by mouth daily.         . naproxen sodium (ANAPROX) 220 MG tablet   Oral   Take 440 mg by mouth 2 (two) times daily with a meal.         . NON FORMULARY   Oral   Take 4-6 tablets by mouth at bedtime. Herbal laxative         . potassium chloride SA (K-DUR,KLOR-CON) 20 MEQ tablet   Oral   Take 20 mEq by mouth 2 (two) times daily.         . ranitidine (ZANTAC) 150 MG tablet   Oral   Take 300 mg by mouth 2 (two) times daily.          Marland Kitchen triamterene-hydrochlorothiazide (MAXZIDE-25) 37.5-25 MG per tablet   Oral   Take 1 tablet by mouth daily before breakfast.         . zolpidem (AMBIEN) 10 MG tablet   Oral   Take 10 mg by mouth at bedtime. Sleep          There were no vitals taken for this visit. Physical Exam  Nursing note and vitals  reviewed. Constitutional: She is oriented to person, place, and time. She appears well-developed and well-nourished. No distress.  HENT:  Head: Normocephalic and atraumatic.  Neck: Normal range of motion. Neck supple.  Cardiovascular: Normal rate and regular rhythm.  Exam reveals no gallop and no friction rub.   No murmur heard. Pulmonary/Chest: Effort normal and breath sounds normal. No respiratory distress. She has no wheezes.  Abdominal: Soft. Bowel sounds are normal. She exhibits no distension. There is no tenderness.  Musculoskeletal: Normal range of motion.  Neurological: She is alert and oriented to person, place, and time.  Skin: Skin is warm and dry. She is not diaphoretic.    ED Course  Procedures (including critical care time) Labs Review Labs Reviewed - No data to display Imaging Review No results found.    MDM  No diagnosis found. Patient is a 73 year old female sent from preop testing for evaluation of an abnormal EKG. Her EKG shows a right bundle-branch branch block, however the reading by the computer suggested an acute MI. Her EKG does not show an acute cardiac event. It is completely unchanged from her prior studies from October of 2013. She is having no discomfort and no other symptoms that would be suggestive of an acute coronary syndrome. Her troponin is negative and I feel as though she is stable for discharge.    Veryl Speak, MD 10/23/13 [phone]

## 2013-10-23 NOTE — Progress Notes (Addendum)
Dr. Marcell Barlow and Dr. Landry Dyke shown confirmed EKG from today. They stated that she is ok for surgery and does not need cardiac clearance before surgery. LM with Abigail Butts from Dr. Anne Fu office and made aware.

## 2013-10-23 NOTE — Discharge Instructions (Signed)
Return to the emergency department if you develop chest pain, difficulty breathing, or other new or concerning symptoms.

## 2013-10-23 NOTE — ED Notes (Signed)
Pt brought over from the OR pre surgical center for 2 EKG that was showing an acute MI. Pt denies any chest pain, no sob, no distress. Alert x4.

## 2013-10-23 NOTE — ED Notes (Signed)
Bed: RESB Expected date:  Expected time:  Means of arrival:  Comments: Hold- pt from OR

## 2013-10-23 NOTE — Progress Notes (Signed)
Pt needs antibiotic ordered per SCIP protocol. Please address.  

## 2013-10-23 NOTE — Patient Instructions (Addendum)
[place]  10/23/2013   Your procedure is scheduled on: 11/02/13  Report to [facility] at 6:00 AM.  Call this number if you have problems the morning of surgery 336-: [phone]   Remember: NO VISITORS UNDER AGE 72 DUE TO [place], please bring inhaler on day of surgery   Do not eat food or drink liquids After Midnight.     Take these medicines the morning of surgery with A SIP OF WATER: ALBUTEROL, VICODIN IF NEEDED, ZANTAC   Do not wear jewelry, make-up or nail polish.  Do not wear lotions, powders, or perfumes. You may wear deodorant.  Do not shave 48 hours prior to surgery. Men may shave face and neck.  Do not bring valuables to the hospital.  Contacts, dentures or bridgework may not be worn into surgery.  Leave suitcase in the car. After surgery it may be brought to your room.  For patients admitted to the hospital, checkout time is 11:00 AM the day of discharge.   Please read over the following fact sheets that you were given: MRSA Information,incentive spirometry fact sheet, blood fact sheet Paulette Blanch, RN  pre op nurse call if needed [phone]    FAILURE TO [place] OF YOUR SURGERY   Patient Signature: ___________________________________________

## 2013-10-23 NOTE — Progress Notes (Signed)
EKG showed STEMI. EKG shown to Dr. Marcell Barlow and Dr. Landry Dyke. Both said pt needed cardiac clearance and needed to go to ED. Pt states no chest pain or SOB. ED triage nurse and charge nurse made aware. Pt in wheelchair on route to ED.

## 2013-10-26 NOTE — Progress Notes (Signed)
Pt called stating Dr. Anne Fu phone was down. She really wants him to be aware of RBBB and her trip to ED. Confirmed that he was called on 10/23/13 to explain situation. She is "getting anxious about having surgery with all of this going on". She wants to be called asap. Please address.

## 2013-10-27 ENCOUNTER — Other Ambulatory Visit: Payer: Self-pay | Admitting: Orthopedic Surgery

## 2013-10-27 MED ORDER — DEXTROSE 5 % IV SOLN: 2.0000 g | Freq: Once | INTRAVENOUS | Status: DC

## 2013-11-01 ENCOUNTER — Other Ambulatory Visit: Payer: Self-pay | Admitting: Orthopedic Surgery

## 2013-11-01 NOTE — H&P (Signed)
Debra Griffith  DOB: 11/01/1940 Married / Language: English / Race: White Female  Date of Admission:  11-02-2013  Chief Complaint:  Bilateral Knee Pain, Left greater than Right  History of Present Illness The patient is a 73 year old female who comes in for a preoperative History and Physical. The patient is scheduled for a left total knee arthroplasty to be performed by Dr. Dione Plover. Aluisio, MD at [facility] on 11-02-2013. The patient is being followed for their bilateral knee pain and osteoarthritis. They are now several months(s) out from cortisone injection. Symptoms reported today include: pain, stiffness, grinding, difficulty ambulating and difficulty arising from chair. The patient feels that they are doing poorly and report their pain level to be moderate. Current treatment includes: NSAIDs. The following medication has been used for pain control: Hydrocodone. The patient has reported improvement of their symptoms with: Cortisone injections. She says her hip is doing great. The left knee is doing terribly. She has a lot of pain in both knees, but the left is worse than the right. It is limiting what she can and cannot do. She's had cortisone and Visco supplements, none of which is helping anymore. She is at a stage now where she is ready to get the left knee replaced. She would like to get a cortisone shot in the right knee at the same time. They have been treated conservatively in the past for the above stated problem and despite conservative measures, they continue to have progressive pain and severe functional limitations and dysfunction. They have failed non-operative management including home exercise, medications, and injections. It is felt that they would benefit from undergoing total joint replacement. Risks and benefits of the procedure have been discussed with the patient and they elect to proceed with surgery. There are no active contraindications to surgery  such as ongoing infection or rapidly progressive neurological disease.   Allergies Morphine Sulfate *ANALGESICS - OPIOID*. Abnormal Behavior and hallucinations   Problem List/Past MedicalHistory Aftercare following joint replacement surgery (V54.81) Osteoarthritis, Hip (715.35) Lumbar/Lumbosacral Disc Degeneration (722.52) Bursitis, Hip (726.5) Pain, Hip (719.45) S/P Left total hip arthroplasty (V43.64) Primary osteoarthritis of both knees (715.16) Encounter for long-term use of opiate analgesic (V58.69) Hypercholesterolemia Obesity. Morbid Anxiety Disorder Osteoarthritis Urinary Tract Infection. Past History High blood pressure Edema. Lower Extremity Insomnia Depression Bronchitis Vitamin D Deficiency Mild Reactive Airway Disease Arrhythmia. History of "Irregular Heart Beat" History of "[facility] Fever. (Coccidiodomycosis)   Family History Osteoporosis. mother Heart Disease. father Hypertension. mother, father and sister Chronic Obstructive Lung Disease. father Cancer. father Cerebrovascular Accident. father Congestive Heart Failure. First Degree Relatives. father Osteoarthritis. First Degree Relatives. mother and sister    Social History Illicit drug use. no Tobacco use. Never smoker. never smoker Pain Contract. yes Advance Directives. Living Will Post-Surgical Plans. Plan is to go to [facility]. Exercise. Exercises weekly Drug/Alcohol Rehab (Previously). no Tobacco / smoke exposure. no Number of flights of stairs before winded. 4-5 No alcohol use Alcohol use. never consumed alcohol Drug/Alcohol Rehab (Currently). no Marital status. married Children. 3 Current work status. 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 Klor-Con M20 [facility] Tablet ER, Oral two times daily) Active. Maxzide-25 (37.5-25MG  Tablet, Oral daily)  Active. Ranitidine HCl (150MG  Tablet, Oral two times daily) Active. Metoprolol Tartrate (25MG  Tablet, Oral two times daily) Active. Aleve (220MG  Tablet, Oral) Active. Norco (10-325MG  Tablet, 1 Oral four times daily, as needed, Taken starting  09/21/2013) Active. Cyclobenzaprine HCl (10MG Tablet, 2 Oral at bedtime) Active. Ambien (10MG Tablet, Oral) Active. KlonoPIN (1MG Tablet, Oral at bedtime) Active. Aspirin EC (81MG Tablet DR, Oral) Active.   Past Surgical History Hysterectomy. complete (non-cancerous) Arthroscopic Knee Surgery - Right Cesarean Delivery. 3 or more times Total Hip Replacement - Left. Date: 2013.   Review of Systems General:Not Present- Chills, Fever, Night Sweats, Fatigue, Weight Gain, Weight Loss and Memory Loss. Skin:Not Present- Hives, Itching, Rash, Eczema and Lesions. HEENT:Not Present- Tinnitus, Headache, Double Vision, Visual Loss, Hearing Loss and Dentures. Respiratory:Not Present- Shortness of breath with exertion, Shortness of breath at rest, Allergies, Coughing up blood and Chronic Cough. Cardiovascular:Not Present- Chest Pain, Racing/skipping heartbeats, Difficulty Breathing Lying Down, Murmur, Swelling and Palpitations. Gastrointestinal:Not Present- Bloody Stool, Heartburn, Abdominal Pain, Vomiting, Nausea, Constipation, Diarrhea, Difficulty Swallowing, Jaundice and Loss of appetitie. Female Genitourinary:Not Present- Blood in Urine, Urinary frequency, Weak urinary stream, Discharge, Flank Pain, Incontinence, Painful Urination, Urgency, Urinary Retention and Urinating at Night. Musculoskeletal:Not Present- Muscle Weakness, Muscle Pain, Joint Swelling, Joint Pain, Back Pain, Morning Stiffness and Spasms. Neurological:Not Present- Tremor, Dizziness, Blackout spells, Paralysis, Difficulty with balance and Weakness. Psychiatric:Not Present- Insomnia.    Vitals Pulse: 68 (Regular) Resp.: 14 (Unlabored) BP: 132/86 (Sitting, Left Arm,  Standard)     Physical Exam The physical exam findings are as follows:   General Mental Status - Alert, cooperative and good historian. General Appearance- pleasant. Not in acute distress. Orientation- Oriented X3. Build & Nutrition- Well nourished and Well developed.   Head and Neck Head- normocephalic, atraumatic . Neck Global Assessment- supple. no bruit auscultated on the right and no bruit auscultated on the left.   Eye Pupil- Bilateral- Regular and Round. Motion- Bilateral- EOMI.   Chest and Lung Exam Auscultation: Breath sounds:- clear at anterior chest wall and - clear at posterior chest wall. Adventitious sounds:- No Adventitious sounds.   Cardiovascular Auscultation:Rhythm- Regular rate and rhythm. Heart Sounds- S1 WNL and S2 WNL. Murmurs & Other Heart Sounds:Auscultation of the heart reveals - No Murmurs.   Abdomen Inspection:Contour- Generalized moderate distention. Palpation/Percussion:Tenderness- Abdomen is non-tender to palpation. Rigidity (guarding)- Abdomen is soft. Auscultation:Auscultation of the abdomen reveals - Bowel sounds normal.   Female Genitourinary Not done, not pertinent to present illness  Musculoskeletal  Left Knee showed no effusion. She has the varus deformity. Sensation and circulation are intact. Crepitation throughout the range of motion.  RADIOGRAPHS: X-rays pf the left knee shows advanced medial compartment and patellofemoral changes, moderate lateral compartment changes.   Assessment & Plan Primary osteoarthritis of both knees (715.16) Impression: Left Knee greater than Right Knee  Note: Plan is for a Left Total Knee Replacement and Right Knee Cortisone Injection by Dr. Aluisio.  Plan is to go to [name] following the hospital stay.  PCP - Dr. Neville Gates  The patient does not have any contraindications and will receive TXA (tranexamic acid) prior to surgery.  Signed  electronically by Amika Tassin L Manal Kreutzer, III PA-C 

## 2013-11-02 ENCOUNTER — Encounter (HOSPITAL_COMMUNITY): Payer: Self-pay

## 2013-11-02 ENCOUNTER — Encounter (HOSPITAL_COMMUNITY)
Admission: RE | Disposition: A | Discharge status: Skilled Nursing Facility | Payer: Self-pay | Source: Ambulatory Visit | Attending: Orthopedic Surgery

## 2013-11-02 ENCOUNTER — Inpatient Hospital Stay (HOSPITAL_COMMUNITY): Payer: Medicare Other | Admitting: Registered Nurse

## 2013-11-02 ENCOUNTER — Encounter (HOSPITAL_COMMUNITY): Payer: Medicare Other | Admitting: Registered Nurse

## 2013-11-02 ENCOUNTER — Inpatient Hospital Stay (HOSPITAL_COMMUNITY)
Admission: RE | Admit: 2013-11-02 | Discharge: 2013-11-05 | DRG: 470 | Disposition: A | Discharge status: Skilled Nursing Facility | Payer: Medicare Other | Source: Ambulatory Visit | Attending: Orthopedic Surgery | Admitting: Orthopedic Surgery

## 2013-11-02 DIAGNOSIS — G4700 Insomnia, unspecified: Secondary | ICD-10-CM | POA: Diagnosis present

## 2013-11-02 DIAGNOSIS — F411 Generalized anxiety disorder: Secondary | ICD-10-CM | POA: Diagnosis present

## 2013-11-02 DIAGNOSIS — E559 Vitamin D deficiency, unspecified: Secondary | ICD-10-CM | POA: Diagnosis not present

## 2013-11-02 DIAGNOSIS — S8990XA Unspecified injury of unspecified lower leg, initial encounter: Secondary | ICD-10-CM | POA: Diagnosis not present

## 2013-11-02 DIAGNOSIS — Z471 Aftercare following joint replacement surgery: Secondary | ICD-10-CM | POA: Diagnosis not present

## 2013-11-02 DIAGNOSIS — M179 Osteoarthritis of knee, unspecified: Secondary | ICD-10-CM | POA: Diagnosis present

## 2013-11-02 DIAGNOSIS — I10 Essential (primary) hypertension: Secondary | ICD-10-CM | POA: Diagnosis present

## 2013-11-02 DIAGNOSIS — Z96649 Presence of unspecified artificial hip joint: Secondary | ICD-10-CM

## 2013-11-02 DIAGNOSIS — F329 Major depressive disorder, single episode, unspecified: Secondary | ICD-10-CM | POA: Diagnosis present

## 2013-11-02 DIAGNOSIS — M51379 Other intervertebral disc degeneration, lumbosacral region without mention of lumbar back pain or lower extremity pain: Secondary | ICD-10-CM | POA: Diagnosis present

## 2013-11-02 DIAGNOSIS — M25569 Pain in unspecified knee: Secondary | ICD-10-CM | POA: Diagnosis not present

## 2013-11-02 DIAGNOSIS — E871 Hypo-osmolality and hyponatremia: Secondary | ICD-10-CM | POA: Diagnosis not present

## 2013-11-02 DIAGNOSIS — Z6841 Body Mass Index (BMI) 40.0 and over, adult: Secondary | ICD-10-CM

## 2013-11-02 DIAGNOSIS — F3289 Other specified depressive episodes: Secondary | ICD-10-CM | POA: Diagnosis present

## 2013-11-02 DIAGNOSIS — K219 Gastro-esophageal reflux disease without esophagitis: Secondary | ICD-10-CM | POA: Diagnosis present

## 2013-11-02 DIAGNOSIS — M5137 Other intervertebral disc degeneration, lumbosacral region: Secondary | ICD-10-CM | POA: Diagnosis present

## 2013-11-02 DIAGNOSIS — M6281 Muscle weakness (generalized): Secondary | ICD-10-CM | POA: Diagnosis not present

## 2013-11-02 DIAGNOSIS — D62 Acute posthemorrhagic anemia: Secondary | ICD-10-CM | POA: Diagnosis not present

## 2013-11-02 DIAGNOSIS — Z8249 Family history of ischemic heart disease and other diseases of the circulatory system: Secondary | ICD-10-CM

## 2013-11-02 DIAGNOSIS — R279 Unspecified lack of coordination: Secondary | ICD-10-CM | POA: Diagnosis not present

## 2013-11-02 DIAGNOSIS — R269 Unspecified abnormalities of gait and mobility: Secondary | ICD-10-CM | POA: Diagnosis not present

## 2013-11-02 DIAGNOSIS — M1710 Unilateral primary osteoarthritis, unspecified knee: Secondary | ICD-10-CM | POA: Diagnosis not present

## 2013-11-02 DIAGNOSIS — Z96652 Presence of left artificial knee joint: Secondary | ICD-10-CM

## 2013-11-02 DIAGNOSIS — Z96659 Presence of unspecified artificial knee joint: Secondary | ICD-10-CM | POA: Diagnosis not present

## 2013-11-02 DIAGNOSIS — M1990 Unspecified osteoarthritis, unspecified site: Secondary | ICD-10-CM | POA: Diagnosis not present

## 2013-11-02 DIAGNOSIS — IMO0002 Reserved for concepts with insufficient information to code with codable children: Secondary | ICD-10-CM | POA: Diagnosis not present

## 2013-11-02 DIAGNOSIS — E669 Obesity, unspecified: Secondary | ICD-10-CM | POA: Diagnosis not present

## 2013-11-02 DIAGNOSIS — S99919A Unspecified injury of unspecified ankle, initial encounter: Secondary | ICD-10-CM | POA: Diagnosis not present

## 2013-11-02 DIAGNOSIS — D649 Anemia, unspecified: Secondary | ICD-10-CM | POA: Diagnosis not present

## 2013-11-02 HISTORY — DX: Osteoarthritis of knee, unspecified: M17.9

## 2013-11-02 HISTORY — DX: Unilateral primary osteoarthritis, unspecified knee: M17.10

## 2013-11-02 HISTORY — PX: TOTAL KNEE ARTHROPLASTY: SHX125

## 2013-11-02 LAB — TYPE AND SCREEN
ABO/RH(D): A POS
ANTIBODY SCREEN: NEGATIVE

## 2013-11-02 SURGERY — ARTHROPLASTY, KNEE, TOTAL
Anesthesia: Spinal | Site: Knee

## 2013-11-02 MED ORDER — ONDANSETRON HCL 4 MG/2ML IJ SOLN
INTRAMUSCULAR | Status: AC
  Filled 2013-11-02: qty 2

## 2013-11-02 MED ORDER — IRBESARTAN 300 MG PO TABS
300.0000 mg | ORAL_TABLET | Freq: Every day | ORAL | Status: DC
  Filled 2013-11-02: qty 1

## 2013-11-02 MED ORDER — PHENYLEPHRINE HCL 10 MG/ML IJ SOLN
20.0000 mg | INTRAVENOUS | Status: DC | PRN
  Administered 2013-11-02: 15 ug/min via INTRAVENOUS

## 2013-11-02 MED ORDER — ACETAMINOPHEN 500 MG PO TABS
1000.0000 mg | ORAL_TABLET | Freq: Once | ORAL | Status: AC
  Administered 2013-11-02: 1000 mg via ORAL
  Filled 2013-11-02: qty 2

## 2013-11-02 MED ORDER — ONDANSETRON HCL 4 MG/2ML IJ SOLN
INTRAMUSCULAR | Status: DC | PRN
  Administered 2013-11-02: 4 mg via INTRAVENOUS

## 2013-11-02 MED ORDER — PROPOFOL 10 MG/ML IV BOLUS
INTRAVENOUS | Status: AC
  Filled 2013-11-02: qty 20

## 2013-11-02 MED ORDER — FENTANYL CITRATE 0.05 MG/ML IJ SOLN
INTRAMUSCULAR | Status: DC | PRN
  Administered 2013-11-02: 50 ug via INTRAVENOUS

## 2013-11-02 MED ORDER — ALBUTEROL SULFATE HFA 108 (90 BASE) MCG/ACT IN AERS: 2.0000 | INHALATION_SPRAY | Freq: Four times a day (QID) | RESPIRATORY_TRACT | Status: DC | PRN

## 2013-11-02 MED ORDER — PROPOFOL INFUSION 10 MG/ML OPTIME
INTRAVENOUS | Status: DC | PRN
  Administered 2013-11-02: 75 ug/kg/min via INTRAVENOUS

## 2013-11-02 MED ORDER — RIVAROXABAN 10 MG PO TABS
10.0000 mg | ORAL_TABLET | Freq: Every day | ORAL | Status: DC
  Administered 2013-11-03 – 2013-11-05 (×3): 10 mg via ORAL
  Filled 2013-11-02 (×4): qty 1

## 2013-11-02 MED ORDER — DEXAMETHASONE SODIUM PHOSPHATE 10 MG/ML IJ SOLN
10.0000 mg | Freq: Once | INTRAMUSCULAR | Status: AC
  Administered 2013-11-02: 10 mg via INTRAVENOUS

## 2013-11-02 MED ORDER — FERROUS SULFATE 325 (65 FE) MG PO TABS
325.0000 mg | ORAL_TABLET | Freq: Every day | ORAL | Status: DC
  Administered 2013-11-03 – 2013-11-05 (×3): 325 mg via ORAL
  Filled 2013-11-02 (×5): qty 1

## 2013-11-02 MED ORDER — BUPIVACAINE-EPINEPHRINE PF 0.25-1:200000 % IJ SOLN
INTRAMUSCULAR | Status: AC
  Filled 2013-11-02: qty 30

## 2013-11-02 MED ORDER — ONDANSETRON HCL 4 MG PO TABS: 4.0000 mg | ORAL_TABLET | Freq: Four times a day (QID) | ORAL | Status: DC | PRN

## 2013-11-02 MED ORDER — LACTATED RINGERS IV SOLN
INTRAVENOUS | Status: DC | PRN
  Administered 2013-11-02: 08:00:00 via INTRAVENOUS

## 2013-11-02 MED ORDER — BUPIVACAINE IN DEXTROSE 0.75-8.25 % IT SOLN
INTRATHECAL | Status: DC | PRN
  Administered 2013-11-02: 1.5 mL via INTRATHECAL

## 2013-11-02 MED ORDER — AMLODIPINE BESYLATE 10 MG PO TABS
10.0000 mg | ORAL_TABLET | Freq: Once | ORAL | Status: AC
  Administered 2013-11-02: 10 mg via ORAL
  Filled 2013-11-02: qty 1

## 2013-11-02 MED ORDER — POLYETHYLENE GLYCOL 3350 17 G PO PACK
17.0000 g | PACK | Freq: Every day | ORAL | Status: DC | PRN
  Administered 2013-11-04: 17 g via ORAL

## 2013-11-02 MED ORDER — ZOLPIDEM TARTRATE 5 MG PO TABS
5.0000 mg | ORAL_TABLET | Freq: Every day | ORAL | Status: DC
  Filled 2013-11-02: qty 1

## 2013-11-02 MED ORDER — CEFAZOLIN SODIUM-DEXTROSE 2-3 GM-% IV SOLR
INTRAVENOUS | Status: DC | PRN
  Administered 2013-11-02: 2 g via INTRAVENOUS

## 2013-11-02 MED ORDER — ALBUTEROL SULFATE (2.5 MG/3ML) 0.083% IN NEBU: 2.5000 mg | INHALATION_SOLUTION | Freq: Four times a day (QID) | RESPIRATORY_TRACT | Status: DC | PRN

## 2013-11-02 MED ORDER — FLEET ENEMA 7-19 GM/118ML RE ENEM: 1.0000 | ENEMA | Freq: Once | RECTAL | Status: AC | PRN

## 2013-11-02 MED ORDER — MIDAZOLAM HCL 5 MG/5ML IJ SOLN
INTRAMUSCULAR | Status: DC | PRN
  Administered 2013-11-02: 2 mg via INTRAVENOUS

## 2013-11-02 MED ORDER — TRIAMTERENE-HCTZ 37.5-25 MG PO TABS
1.0000 | ORAL_TABLET | Freq: Every day | ORAL | Status: DC
  Administered 2013-11-03 – 2013-11-05 (×3): 1 via ORAL
  Filled 2013-11-02 (×5): qty 1

## 2013-11-02 MED ORDER — BUPIVACAINE HCL (PF) 0.25 % IJ SOLN
INTRAMUSCULAR | Status: AC
  Filled 2013-11-02: qty 30

## 2013-11-02 MED ORDER — METHOCARBAMOL 100 MG/ML IJ SOLN
500.0000 mg | Freq: Four times a day (QID) | INTRAVENOUS | Status: DC | PRN
  Administered 2013-11-02: 500 mg via INTRAVENOUS
  Filled 2013-11-02: qty 5

## 2013-11-02 MED ORDER — BISACODYL 10 MG RE SUPP: 10.0000 mg | Freq: Every day | RECTAL | Status: DC | PRN

## 2013-11-02 MED ORDER — ZOLPIDEM TARTRATE 5 MG PO TABS: 5.0000 mg | ORAL_TABLET | Freq: Every day | ORAL | Status: DC

## 2013-11-02 MED ORDER — MEPERIDINE HCL 50 MG/ML IJ SOLN: 6.2500 mg | INTRAMUSCULAR | Status: DC | PRN

## 2013-11-02 MED ORDER — METHYLPREDNISOLONE ACETATE 40 MG/ML IJ SUSP
80.0000 mg | Freq: Once | INTRAMUSCULAR | Status: DC
  Filled 2013-11-02: qty 2

## 2013-11-02 MED ORDER — ONDANSETRON HCL 4 MG/2ML IJ SOLN: 4.0000 mg | Freq: Four times a day (QID) | INTRAMUSCULAR | Status: DC | PRN

## 2013-11-02 MED ORDER — CLONAZEPAM 1 MG PO TABS
1.0000 mg | ORAL_TABLET | Freq: Every day | ORAL | Status: DC
  Administered 2013-11-02 – 2013-11-04 (×3): 1 mg via ORAL
  Filled 2013-11-02 (×3): qty 1

## 2013-11-02 MED ORDER — HYDROMORPHONE HCL PF 1 MG/ML IJ SOLN
INTRAMUSCULAR | Status: AC
  Administered 2013-11-02: 0.5 mg via INTRAVENOUS
  Filled 2013-11-02: qty 1

## 2013-11-02 MED ORDER — TRANEXAMIC ACID 100 MG/ML IV SOLN
1000.0000 mg | INTRAVENOUS | Status: AC
  Administered 2013-11-02: 1000 mg via INTRAVENOUS
  Filled 2013-11-02: qty 10

## 2013-11-02 MED ORDER — EPHEDRINE SULFATE 50 MG/ML IJ SOLN
INTRAMUSCULAR | Status: AC
  Filled 2013-11-02: qty 1

## 2013-11-02 MED ORDER — AMLODIPINE BESYLATE-VALSARTAN 10-320 MG PO TABS: 1.0000 | ORAL_TABLET | Freq: Every day | ORAL | Status: DC

## 2013-11-02 MED ORDER — KETOROLAC TROMETHAMINE 15 MG/ML IJ SOLN: 7.5000 mg | Freq: Four times a day (QID) | INTRAMUSCULAR | Status: AC | PRN

## 2013-11-02 MED ORDER — LIDOCAINE HCL 1 % IJ SOLN
INTRAMUSCULAR | Status: AC
  Filled 2013-11-02: qty 20

## 2013-11-02 MED ORDER — FENTANYL CITRATE 0.05 MG/ML IJ SOLN
INTRAMUSCULAR | Status: AC
  Filled 2013-11-02: qty 2

## 2013-11-02 MED ORDER — SODIUM CHLORIDE 0.9 % IR SOLN
Status: DC | PRN
  Administered 2013-11-02: 1000 mL

## 2013-11-02 MED ORDER — BUPIVACAINE LIPOSOME 1.3 % IJ SUSP
INTRAMUSCULAR | Status: DC | PRN
  Administered 2013-11-02: 09:00:00

## 2013-11-02 MED ORDER — DEXAMETHASONE SODIUM PHOSPHATE 10 MG/ML IJ SOLN
INTRAMUSCULAR | Status: AC
  Filled 2013-11-02: qty 1

## 2013-11-02 MED ORDER — ATROPINE SULFATE 0.4 MG/ML IJ SOLN
INTRAMUSCULAR | Status: AC
  Filled 2013-11-02: qty 2

## 2013-11-02 MED ORDER — PROMETHAZINE HCL 25 MG/ML IJ SOLN: 6.2500 mg | INTRAMUSCULAR | Status: DC | PRN

## 2013-11-02 MED ORDER — PHENOL 1.4 % MT LIQD
1.0000 | OROMUCOSAL | Status: DC | PRN
  Filled 2013-11-02: qty 177

## 2013-11-02 MED ORDER — SODIUM CHLORIDE 0.9 % IJ SOLN
INTRAMUSCULAR | Status: AC
  Filled 2013-11-02: qty 10

## 2013-11-02 MED ORDER — DOCUSATE SODIUM 100 MG PO CAPS
100.0000 mg | ORAL_CAPSULE | Freq: Two times a day (BID) | ORAL | Status: DC
Start: 2013-11-02 — End: 2013-11-05
  Administered 2013-11-02 – 2013-11-05 (×6): 100 mg via ORAL

## 2013-11-02 MED ORDER — MIDAZOLAM HCL 2 MG/2ML IJ SOLN
INTRAMUSCULAR | Status: AC
  Filled 2013-11-02: qty 2

## 2013-11-02 MED ORDER — SODIUM CHLORIDE 0.9 % IV SOLN: INTRAVENOUS | Status: DC

## 2013-11-02 MED ORDER — ACETAMINOPHEN 500 MG PO TABS
1000.0000 mg | ORAL_TABLET | Freq: Four times a day (QID) | ORAL | Status: AC
Start: 2013-11-02 — End: 2013-11-03
  Administered 2013-11-02 – 2013-11-03 (×4): 1000 mg via ORAL
  Filled 2013-11-02 (×4): qty 2

## 2013-11-02 MED ORDER — CEFAZOLIN SODIUM-DEXTROSE 2-3 GM-% IV SOLR
2.0000 g | Freq: Four times a day (QID) | INTRAVENOUS | Status: AC
  Administered 2013-11-02 (×2): 2 g via INTRAVENOUS
  Filled 2013-11-02 (×2): qty 50

## 2013-11-02 MED ORDER — ZOLPIDEM TARTRATE 5 MG PO TABS
5.0000 mg | ORAL_TABLET | Freq: Every day | ORAL | Status: DC
  Administered 2013-11-02: 5 mg via ORAL
  Filled 2013-11-02: qty 1

## 2013-11-02 MED ORDER — METOPROLOL TARTRATE 50 MG PO TABS: 50.0000 mg | ORAL_TABLET | Freq: Every day | ORAL | Status: DC

## 2013-11-02 MED ORDER — ACETAMINOPHEN 325 MG PO TABS: 650.0000 mg | ORAL_TABLET | Freq: Four times a day (QID) | ORAL | Status: DC | PRN

## 2013-11-02 MED ORDER — TRAMADOL HCL 50 MG PO TABS: 50.0000 mg | ORAL_TABLET | Freq: Four times a day (QID) | ORAL | Status: DC | PRN

## 2013-11-02 MED ORDER — ZOLPIDEM TARTRATE 10 MG PO TABS: 10.0000 mg | ORAL_TABLET | Freq: Every day | ORAL | Status: DC

## 2013-11-02 MED ORDER — SODIUM CHLORIDE 0.9 % IJ SOLN
INTRAMUSCULAR | Status: AC
  Filled 2013-11-02: qty 20

## 2013-11-02 MED ORDER — EPHEDRINE SULFATE 50 MG/ML IJ SOLN
INTRAMUSCULAR | Status: DC | PRN
  Administered 2013-11-02: 10 mg via INTRAVENOUS
  Administered 2013-11-02: 5 mg via INTRAVENOUS

## 2013-11-02 MED ORDER — POTASSIUM CHLORIDE IN NACL 20-0.9 MEQ/L-% IV SOLN
INTRAVENOUS | Status: DC
  Administered 2013-11-02 – 2013-11-03 (×2): via INTRAVENOUS
  Filled 2013-11-02 (×4): qty 1000

## 2013-11-02 MED ORDER — DEXAMETHASONE SODIUM PHOSPHATE 10 MG/ML IJ SOLN
10.0000 mg | Freq: Every day | INTRAMUSCULAR | Status: AC
  Filled 2013-11-02: qty 1

## 2013-11-02 MED ORDER — OXYCODONE HCL 5 MG PO TABS
5.0000 mg | ORAL_TABLET | ORAL | Status: DC | PRN
  Administered 2013-11-02 – 2013-11-05 (×14): 10 mg via ORAL
  Filled 2013-11-02 (×14): qty 2

## 2013-11-02 MED ORDER — STERILE WATER FOR IRRIGATION IR SOLN
Status: DC | PRN
  Administered 2013-11-02: 1500 mL

## 2013-11-02 MED ORDER — DEXAMETHASONE 6 MG PO TABS
10.0000 mg | ORAL_TABLET | Freq: Every day | ORAL | Status: AC
  Administered 2013-11-03: 10 mg via ORAL
  Filled 2013-11-02: qty 1

## 2013-11-02 MED ORDER — POTASSIUM CHLORIDE CRYS ER 20 MEQ PO TBCR
20.0000 meq | EXTENDED_RELEASE_TABLET | Freq: Two times a day (BID) | ORAL | Status: DC
  Administered 2013-11-02 – 2013-11-05 (×7): 20 meq via ORAL
  Filled 2013-11-02 (×8): qty 1

## 2013-11-02 MED ORDER — METOPROLOL TARTRATE 25 MG PO TABS
25.0000 mg | ORAL_TABLET | Freq: Two times a day (BID) | ORAL | Status: DC
  Administered 2013-11-02 – 2013-11-05 (×6): 25 mg via ORAL
  Filled 2013-11-02 (×7): qty 1

## 2013-11-02 MED ORDER — CYCLOBENZAPRINE HCL 10 MG PO TABS
20.0000 mg | ORAL_TABLET | Freq: Every day | ORAL | Status: DC
  Administered 2013-11-02 – 2013-11-04 (×3): 20 mg via ORAL
  Filled 2013-11-02 (×4): qty 2

## 2013-11-02 MED ORDER — SODIUM CHLORIDE 0.9 % IJ SOLN
INTRAMUSCULAR | Status: AC
  Filled 2013-11-02: qty 50

## 2013-11-02 MED ORDER — MENTHOL 3 MG MT LOZG
1.0000 | LOZENGE | OROMUCOSAL | Status: DC | PRN
  Filled 2013-11-02: qty 9

## 2013-11-02 MED ORDER — DIPHENHYDRAMINE HCL 12.5 MG/5ML PO ELIX: 12.5000 mg | ORAL_SOLUTION | ORAL | Status: DC | PRN

## 2013-11-02 MED ORDER — CHLORHEXIDINE GLUCONATE CLOTH 2 % EX PADS: 6.0000 | MEDICATED_PAD | Freq: Once | CUTANEOUS | Status: DC

## 2013-11-02 MED ORDER — AMLODIPINE BESYLATE 10 MG PO TABS
10.0000 mg | ORAL_TABLET | Freq: Every day | ORAL | Status: DC
  Filled 2013-11-02: qty 1

## 2013-11-02 MED ORDER — FAMOTIDINE 20 MG PO TABS
20.0000 mg | ORAL_TABLET | Freq: Two times a day (BID) | ORAL | Status: DC
  Administered 2013-11-02 – 2013-11-05 (×6): 20 mg via ORAL
  Filled 2013-11-02 (×8): qty 1

## 2013-11-02 MED ORDER — METOCLOPRAMIDE HCL 5 MG/ML IJ SOLN: 5.0000 mg | Freq: Three times a day (TID) | INTRAMUSCULAR | Status: DC | PRN

## 2013-11-02 MED ORDER — METHYLPREDNISOLONE ACETATE 40 MG/ML IJ SUSP
INTRAMUSCULAR | Status: AC
  Filled 2013-11-02: qty 2

## 2013-11-02 MED ORDER — ACETAMINOPHEN 650 MG RE SUPP: 650.0000 mg | Freq: Four times a day (QID) | RECTAL | Status: DC | PRN

## 2013-11-02 MED ORDER — METHOCARBAMOL 500 MG PO TABS: 500.0000 mg | ORAL_TABLET | Freq: Four times a day (QID) | ORAL | Status: DC | PRN

## 2013-11-02 MED ORDER — PHENYLEPHRINE HCL 10 MG/ML IJ SOLN
INTRAMUSCULAR | Status: AC
  Filled 2013-11-02: qty 2

## 2013-11-02 MED ORDER — LIDOCAINE HCL (CARDIAC) 20 MG/ML IV SOLN
INTRAVENOUS | Status: DC | PRN
  Administered 2013-11-02: 100 mg via INTRAVENOUS

## 2013-11-02 MED ORDER — LIDOCAINE HCL (CARDIAC) 20 MG/ML IV SOLN
INTRAVENOUS | Status: AC
  Filled 2013-11-02: qty 5

## 2013-11-02 MED ORDER — FENTANYL CITRATE 0.05 MG/ML IJ SOLN
25.0000 ug | INTRAMUSCULAR | Status: DC | PRN
  Administered 2013-11-02 (×2): 50 ug via INTRAVENOUS

## 2013-11-02 MED ORDER — BUPIVACAINE LIPOSOME 1.3 % IJ SUSP
20.0000 mL | Freq: Once | INTRAMUSCULAR | Status: DC
  Filled 2013-11-02: qty 20

## 2013-11-02 MED ORDER — METOCLOPRAMIDE HCL 10 MG PO TABS: 5.0000 mg | ORAL_TABLET | Freq: Three times a day (TID) | ORAL | Status: DC | PRN

## 2013-11-02 MED ORDER — BUPIVACAINE HCL 0.25 % IJ SOLN
INTRAMUSCULAR | Status: DC | PRN
  Administered 2013-11-02: 30 mL

## 2013-11-02 MED ORDER — 0.9 % SODIUM CHLORIDE (POUR BTL) OPTIME
TOPICAL | Status: DC | PRN
  Administered 2013-11-02: 1000 mL

## 2013-11-02 MED ORDER — CEFAZOLIN SODIUM-DEXTROSE 2-3 GM-% IV SOLR
INTRAVENOUS | Status: AC
  Filled 2013-11-02: qty 50

## 2013-11-02 MED ORDER — HYDROMORPHONE HCL PF 1 MG/ML IJ SOLN
0.5000 mg | INTRAMUSCULAR | Status: DC | PRN
  Administered 2013-11-02 – 2013-11-03 (×5): 0.5 mg via INTRAVENOUS
  Filled 2013-11-02 (×4): qty 1

## 2013-11-02 SURGICAL SUPPLY — 61 items
BAG ZIPLOCK 12X15 (MISCELLANEOUS) ×3 IMPLANT
BANDAGE ELASTIC 6 VELCRO ST LF (GAUZE/BANDAGES/DRESSINGS) ×3 IMPLANT
BANDAGE ESMARK 6X9 LF (GAUZE/BANDAGES/DRESSINGS) ×1 IMPLANT
BLADE SAG 18X100X1.27 (BLADE) ×3 IMPLANT
BLADE SAW SGTL 11.0X1.19X90.0M (BLADE) ×3 IMPLANT
BNDG ESMARK 6X9 LF (GAUZE/BANDAGES/DRESSINGS) ×3
BOWL SMART MIX CTS (DISPOSABLE) ×3 IMPLANT
CAPT RP KNEE ×3 IMPLANT
CEMENT HV SMART SET (Cement) ×6 IMPLANT
CLOSURE STERI-STRIP 1/4X4 (GAUZE/BANDAGES/DRESSINGS) ×6 IMPLANT
CLOSURE WOUND 1/2 X4 (GAUZE/BANDAGES/DRESSINGS) ×2
CUFF TOURN SGL QUICK 34 (TOURNIQUET CUFF) ×2
CUFF TRNQT CYL 34X4X40X1 (TOURNIQUET CUFF) ×1 IMPLANT
DECANTER SPIKE VIAL GLASS SM (MISCELLANEOUS) ×3 IMPLANT
DRAPE EXTREMITY T 121X128X90 (DRAPE) ×3 IMPLANT
DRAPE POUCH INSTRU U-SHP 10X18 (DRAPES) ×3 IMPLANT
DRAPE U-SHAPE 47X51 STRL (DRAPES) ×3 IMPLANT
DRSG ADAPTIC 3X8 NADH LF (GAUZE/BANDAGES/DRESSINGS) ×3 IMPLANT
DURAPREP 26ML APPLICATOR (WOUND CARE) ×3 IMPLANT
ELECT REM PT RETURN 9FT ADLT (ELECTROSURGICAL) ×3
ELECTRODE REM PT RTRN 9FT ADLT (ELECTROSURGICAL) ×1 IMPLANT
EVACUATOR 1/8 PVC DRAIN (DRAIN) ×3 IMPLANT
FACESHIELD LNG OPTICON STERILE (SAFETY) ×15 IMPLANT
GLOVE BIO SURGEON STRL SZ7.5 (GLOVE) IMPLANT
GLOVE BIO SURGEON STRL SZ8 (GLOVE) ×3 IMPLANT
GLOVE BIOGEL PI IND STRL 6.5 (GLOVE) ×1 IMPLANT
GLOVE BIOGEL PI IND STRL 7.5 (GLOVE) ×3 IMPLANT
GLOVE BIOGEL PI IND STRL 8 (GLOVE) ×2 IMPLANT
GLOVE BIOGEL PI INDICATOR 6.5 (GLOVE) ×2
GLOVE BIOGEL PI INDICATOR 7.5 (GLOVE) ×6
GLOVE BIOGEL PI INDICATOR 8 (GLOVE) ×4
GLOVE SURG SS PI 6.5 STRL IVOR (GLOVE) ×9 IMPLANT
GOWN BRE IMP SLV SIRUS LXLNG (GOWN DISPOSABLE) ×3 IMPLANT
GOWN STRL REUS W/TWL LRG LVL3 (GOWN DISPOSABLE) ×3 IMPLANT
GOWN STRL REUS W/TWL XL LVL3 (GOWN DISPOSABLE) ×6 IMPLANT
HANDPIECE INTERPULSE COAX TIP (DISPOSABLE) ×2
IMMOBILIZER KNEE 20 (SOFTGOODS) ×3 IMPLANT
KIT BASIN OR (CUSTOM PROCEDURE TRAY) ×3 IMPLANT
MANIFOLD NEPTUNE II (INSTRUMENTS) ×3 IMPLANT
NDL SAFETY ECLIPSE 18X1.5 (NEEDLE) ×2 IMPLANT
NEEDLE HYPO 18GX1.5 SHARP (NEEDLE) ×4
NS IRRIG 1000ML POUR BTL (IV SOLUTION) ×3 IMPLANT
PACK TOTAL JOINT (CUSTOM PROCEDURE TRAY) ×3 IMPLANT
PAD ABD 8X10 STRL (GAUZE/BANDAGES/DRESSINGS) ×3 IMPLANT
PADDING CAST COTTON 6X4 STRL (CAST SUPPLIES) ×9 IMPLANT
POSITIONER SURGICAL ARM (MISCELLANEOUS) ×3 IMPLANT
SET HNDPC FAN SPRY TIP SCT (DISPOSABLE) ×1 IMPLANT
SPONGE GAUZE 4X4 12PLY (GAUZE/BANDAGES/DRESSINGS) ×3 IMPLANT
STRIP CLOSURE SKIN 1/2X4 (GAUZE/BANDAGES/DRESSINGS) ×4 IMPLANT
SUCTION FRAZIER 12FR DISP (SUCTIONS) ×3 IMPLANT
SUT MNCRL AB 4-0 PS2 18 (SUTURE) ×3 IMPLANT
SUT VIC AB 2-0 CT1 27 (SUTURE) ×6
SUT VIC AB 2-0 CT1 TAPERPNT 27 (SUTURE) ×3 IMPLANT
SUT VLOC 180 0 24IN GS25 (SUTURE) ×3 IMPLANT
SYR 20CC LL (SYRINGE) ×3 IMPLANT
SYR 3ML LL SCALE MARK (SYRINGE) ×3 IMPLANT
SYR 50ML LL SCALE MARK (SYRINGE) ×3 IMPLANT
TOWEL OR 17X26 10 PK STRL BLUE (TOWEL DISPOSABLE) ×6 IMPLANT
TRAY FOLEY CATH 14FRSI W/METER (CATHETERS) ×3 IMPLANT
WATER STERILE IRR 1500ML POUR (IV SOLUTION) ×3 IMPLANT
WRAP KNEE MAXI GEL POST OP (GAUZE/BANDAGES/DRESSINGS) ×3 IMPLANT

## 2013-11-02 NOTE — Progress Notes (Signed)
Clinical Social Work Department CLINICAL SOCIAL WORK PLACEMENT NOTE 11/02/2013  Patient:  Debra Griffith, Debra Griffith  Account Number:  [id number] Admit date:  11/02/2013  Clinical Social Worker:  Werner Lean, LCSW  Date/time:  11/02/2013 04:28 PM  Clinical Social Work is seeking post-discharge placement for this patient at the following level of care:   SKILLED NURSING   (*CSW will update this form in Epic as items are completed)     Patient/family provided with [place] Department of Clinical Social Work's list of facilities offering this level of care within the geographic area requested by the patient (or if unable, by the patient's family).  11/02/2013  Patient/family informed of their freedom to choose among providers that offer the needed level of care, that participate in Medicare, Medicaid or managed care program needed by the patient, have an available bed and are willing to accept the patient.    Patient/family informed of [facility]' ownership interest in [facility], as well as of the fact that they are under no obligation to receive care at this facility.  PASARR submitted to EDS on  PASARR number received from EDS on 06/24/2002  FL2 transmitted to all facilities in geographic area requested by pt/family on  11/02/2013 FL2 transmitted to all facilities within larger geographic area on   Patient informed that his/her managed care company has contracts with or will negotiate with  certain facilities, including the following:     Patient/family informed of bed offers received:  11/02/2013 [facility]    Patient to be transferred to  on   Patient to be transferred to facility by   The following physician request were entered in Epic:   Additional Comments:  Werner Lean LCSW [phone]

## 2013-11-02 NOTE — Evaluation (Signed)
Physical Therapy Evaluation Patient Details Name: Debra Griffith MRN: [id number] DOB: [birth year] Today's Date: 11/02/2013 Time: 9242-6834 PT Time Calculation (min): 22 min  PT Assessment / Plan / Recommendation History of Present Illness  s/p L TKR  Clinical Impression  Pt is s/p TKA resulting in the deficits listed below (see PT Problem List).  Pt will benefit from skilled PT to increase their independence and safety with mobility to allow discharge to the venue listed below.  Pt with dizziness upon sitting upright which did not resolve nor get better however pt agreeable to transfer to recliner.  Pt plans to d/c to SNF.     PT Assessment  Patient needs continued PT services    Follow Up Recommendations  SNF    Does the patient have the potential to tolerate intense rehabilitation      Barriers to Discharge        Equipment Recommendations  None recommended by PT    Recommendations for Other Services     Frequency 7X/week    Precautions / Restrictions Precautions Precautions: Fall;Knee Required Braces or Orthoses: Knee Immobilizer - Left Knee Immobilizer - Left: Discontinue once straight leg raise with < 10 degree lag Restrictions Other Position/Activity Restrictions: WBAT   Pertinent Vitals/Pain 15/10 L knee pain after transfer, RN into room with pain meds, ice packs applied      Mobility  Bed Mobility Overal bed mobility: Needs Assistance Bed Mobility: Supine to Sit Supine to sit: Mod assist;HOB elevated General bed mobility comments: verbal cues for self assist and technique, assist required for L LE and trunk upright Transfers Overall transfer level: Needs assistance Equipment used: Rolling walker (2 wheeled) Transfers: Sit to/from [facility] Sit to Stand: Mod assist;+2 physical assistance;From elevated surface Stand pivot transfers: Mod assist;+2 physical assistance General transfer comment: verbal cues for safe technique, assist to rise  and steady and control descent    Exercises     PT Diagnosis: Difficulty walking;Acute pain  PT Problem List: Decreased strength;Decreased range of motion;Decreased mobility;Decreased knowledge of precautions;Decreased knowledge of use of DME;Pain PT Treatment Interventions: Functional mobility training;Patient/family education;Gait training;DME instruction;Therapeutic activities;Therapeutic exercise     PT Goals(Current goals can be found in the care plan section) Acute Rehab PT Goals PT Goal Formulation: With patient Time For Goal Achievement: 11/09/13 Potential to Achieve Goals: Good  Visit Information  Last PT Received On: 11/02/13 Assistance Needed: +2 History of Present Illness: s/p L TKR       Prior Functioning  Home Living Family/patient expects to be discharged to:: Skilled nursing facility Additional Comments: has RW Prior Function Level of Independence: Independent with assistive device(s) Communication Communication: No difficulties    Cognition  Cognition Arousal/Alertness: Awake/alert Behavior During Therapy: WFL for tasks assessed/performed Overall Cognitive Status: Within Functional Limits for tasks assessed    Extremity/Trunk Assessment Lower Extremity Assessment Lower Extremity Assessment: LLE deficits/detail LLE Deficits / Details: fair quad contraction, unable to perform SLR, maintained KI LLE: Unable to fully assess due to pain   Balance    End of Session PT - End of Session Equipment Utilized During Treatment: Left knee immobilizer Activity Tolerance: Patient limited by pain Patient left: in chair;with call bell/phone within reach Nurse Communication: Patient requests pain meds CPM Left Knee CPM Left Knee: Off  GP     Haruki Arnold,KATHrine E 11/02/2013, 4:00 PM Carmelia Bake, PT, DPT 11/02/2013 Pager: [phone]

## 2013-11-02 NOTE — Progress Notes (Signed)
Physical Therapy Treatment Patient Details Name: Debra Griffith MRN: [id number] DOB: 1941-07-30 Today's Date: 11/02/2013 Time: 9326-7124 PT Time Calculation (min): 17 min  PT Assessment / Plan / Recommendation  History of Present Illness s/p L TKR   PT Comments   Mobility improved with this session.   Follow Up Recommendations  SNF     Does the patient have the potential to tolerate intense rehabilitation     Barriers to Discharge        Equipment Recommendations  None recommended by PT    Recommendations for Other Services    Frequency 7X/week   Progress towards PT Goals Progress towards PT goals: Progressing toward goals  Plan Current plan remains appropriate    Precautions / Restrictions Precautions Precautions: Fall;Knee Required Braces or Orthoses: Knee Immobilizer - Left Knee Immobilizer - Left: Discontinue once straight leg raise with < 10 degree lag Restrictions Other Position/Activity Restrictions: WBAT   Pertinent Vitals/Pain 5-8 with mobility, repositioned.    Mobility  Bed Mobility Overal bed mobility: Needs Assistance Bed Mobility: Sit to Supine Supine to sit: Mod assist Sit to supine: Mod assist General bed mobility comments: verbal cues for self assist and technique, assist required for L LE onto bed. Transfers Overall transfer level: Needs assistance Equipment used: Rolling walker (2 wheeled) Transfers: Sit to/from [facility] Sit to Stand: Mod assist;+2 physical assistance;From elevated surface Stand pivot transfers: Mod assist;+2 physical assistance General transfer comment: verbal cues for safe technique, assist to rise and steady and control descent    Exercises     PT Diagnosis: Difficulty walking;Acute pain  PT Problem List: Decreased strength;Decreased range of motion;Decreased mobility;Decreased knowledge of precautions;Decreased knowledge of use of DME;Pain PT Treatment Interventions: Functional mobility  training;Patient/family education;Gait training;DME instruction;Therapeutic activities;Therapeutic exercise   PT Goals (current goals can now be found in the care plan section) Acute Rehab PT Goals PT Goal Formulation: With patient Time For Goal Achievement: 11/09/13 Potential to Achieve Goals: Good  Visit Information  Last PT Received On: 11/02/13 Assistance Needed: +2 History of Present Illness: s/p L TKR    Subjective Data      Cognition  Cognition Arousal/Alertness: Awake/alert Behavior During Therapy: Anxious;WFL for tasks assessed/performed Overall Cognitive Status: Within Functional Limits for tasks assessed    Balance     End of Session PT - End of Session Equipment Utilized During Treatment: Left knee immobilizer Activity Tolerance: Patient limited by pain Patient left: with call bell/phone within reach;in bed;with family/visitor present Nurse Communication: Mobility status CPM Left Knee CPM Left Knee: Off   GP     [name] 11/02/2013, 5:36 PM Tresa Endo PT [phone]

## 2013-11-02 NOTE — Preoperative (Addendum)
Beta Blockers   Reason not to administer Beta Blockers:Metoprolol taken at [facility] 11-01-13

## 2013-11-02 NOTE — Progress Notes (Signed)
Utilization review completed.  

## 2013-11-02 NOTE — Progress Notes (Signed)
Clinical Social Work Department BRIEF PSYCHOSOCIAL ASSESSMENT 11/02/2013  Patient:  Debra Griffith,Debra Griffith     Account Number:  [id number]     Admit date:  11/02/2013  Clinical Social Worker:  ,, LCSW  Date/Time:  11/02/2013 04:18 PM  Referred by:  Physician  Date Referred:  10/26/2013 Referred for  SNF Placement   Other Referral:   Interview type:  Patient Other interview type:    PSYCHOSOCIAL DATA Living Status:  HUSBAND Admitted from facility:   Level of care:   Primary support name:  Roger Shibley Primary support relationship to patient:  SPOUSE Degree of support available:   supportive    CURRENT CONCERNS Current Concerns  Post-Acute Placement   Other Concerns:    SOCIAL WORK ASSESSMENT / PLAN Pt is a 72 yr old female living at home prior to hospitalization. CSW met with pt / spouse to assist with d/c planning. Pt has made prior arrangements to have ST Rehab at [name] following hospital d/c. CSW has contacted SNF and d/c plans have been confirmed. CSW will continue to follow to assist with d/c planning to SNF.   Assessment/plan status:  Psychosocial Support/Ongoing Assessment of Needs Other assessment/ plan:   Information/referral to community resources:   None needed at this time.    PATIENT'S/FAMILY'S RESPONSE TO PLAN OF CARE: Pt just returning from surgery. She is alert,calm and pleasant . " I've been to [name] in the past. I'm looking forward to working with Josh , in PT, at [name] again. "      LCSW [phone]    

## 2013-11-02 NOTE — H&P (View-Only) (Signed)
Debra Griffith  DOB: 11/01/1940 Married / Language: English / Race: White Female  Date of Admission:  11-02-2013  Chief Complaint:  Bilateral Knee Pain, Left greater than Right  History of Present Illness The patient is a 73 year old female who comes in for a preoperative History and Physical. The patient is scheduled for a left total knee arthroplasty to be performed by Dr. Dione Plover. Aluisio, MD at [facility] on 11-02-2013. The patient is being followed for their bilateral knee pain and osteoarthritis. They are now several months(s) out from cortisone injection. Symptoms reported today include: pain, stiffness, grinding, difficulty ambulating and difficulty arising from chair. The patient feels that they are doing poorly and report their pain level to be moderate. Current treatment includes: NSAIDs. The following medication has been used for pain control: Hydrocodone. The patient has reported improvement of their symptoms with: Cortisone injections. She says her hip is doing great. The left knee is doing terribly. She has a lot of pain in both knees, but the left is worse than the right. It is limiting what she can and cannot do. She's had cortisone and Visco supplements, none of which is helping anymore. She is at a stage now where she is ready to get the left knee replaced. She would like to get a cortisone shot in the right knee at the same time. They have been treated conservatively in the past for the above stated problem and despite conservative measures, they continue to have progressive pain and severe functional limitations and dysfunction. They have failed non-operative management including home exercise, medications, and injections. It is felt that they would benefit from undergoing total joint replacement. Risks and benefits of the procedure have been discussed with the patient and they elect to proceed with surgery. There are no active contraindications to surgery  such as ongoing infection or rapidly progressive neurological disease.   Allergies Morphine Sulfate *ANALGESICS - OPIOID*. Abnormal Behavior and hallucinations   Problem List/Past MedicalHistory Aftercare following joint replacement surgery (V54.81) Osteoarthritis, Hip (715.35) Lumbar/Lumbosacral Disc Degeneration (722.52) Bursitis, Hip (726.5) Pain, Hip (719.45) S/P Left total hip arthroplasty (V43.64) Primary osteoarthritis of both knees (715.16) Encounter for long-term use of opiate analgesic (V58.69) Hypercholesterolemia Obesity. Morbid Anxiety Disorder Osteoarthritis Urinary Tract Infection. Past History High blood pressure Edema. Lower Extremity Insomnia Depression Bronchitis Vitamin D Deficiency Mild Reactive Airway Disease Arrhythmia. History of "Irregular Heart Beat" History of "[facility] Fever. (Coccidiodomycosis)   Family History Osteoporosis. mother Heart Disease. father Hypertension. mother, father and sister Chronic Obstructive Lung Disease. father Cancer. father Cerebrovascular Accident. father Congestive Heart Failure. First Degree Relatives. father Osteoarthritis. First Degree Relatives. mother and sister    Social History Illicit drug use. no Tobacco use. Never smoker. never smoker Pain Contract. yes Advance Directives. Living Will Post-Surgical Plans. Plan is to go to [facility]. Exercise. Exercises weekly Drug/Alcohol Rehab (Previously). no Tobacco / smoke exposure. no Number of flights of stairs before winded. 4-5 No alcohol use Alcohol use. never consumed alcohol Drug/Alcohol Rehab (Currently). no Marital status. married Children. 3 Current work status. 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 Klor-Con M20 [facility] Tablet ER, Oral two times daily) Active. Maxzide-25 (37.5-25MG  Tablet, Oral daily)  Active. Ranitidine HCl (150MG  Tablet, Oral two times daily) Active. Metoprolol Tartrate (25MG  Tablet, Oral two times daily) Active. Aleve (220MG  Tablet, Oral) Active. Norco (10-325MG  Tablet, 1 Oral four times daily, as needed, Taken starting  09/21/2013) Active. Cyclobenzaprine HCl (10MG  Tablet, 2 Oral at bedtime) Active. Ambien (10MG  Tablet, Oral) Active. KlonoPIN (1MG  Tablet, Oral at bedtime) Active. Aspirin EC (81MG  Tablet DR, Oral) Active.   Past Surgical History Hysterectomy. complete (non-cancerous) Arthroscopic Knee Surgery - Right Cesarean Delivery. 3 or more times Total Hip Replacement - Left. Date: 2013.   Review of Systems General:Not Present- Chills, Fever, Night Sweats, Fatigue, Weight Gain, Weight Loss and Memory Loss. Skin:Not Present- Hives, Itching, Rash, Eczema and Lesions. HEENT:Not Present- Tinnitus, Headache, Double Vision, Visual Loss, Hearing Loss and Dentures. Respiratory:Not Present- Shortness of breath with exertion, Shortness of breath at rest, Allergies, Coughing up blood and Chronic Cough. Cardiovascular:Not Present- Chest Pain, Racing/skipping heartbeats, Difficulty Breathing Lying Down, Murmur, Swelling and Palpitations. Gastrointestinal:Not Present- Bloody Stool, Heartburn, Abdominal Pain, Vomiting, Nausea, Constipation, Diarrhea, Difficulty Swallowing, Jaundice and Loss of appetitie. Female Genitourinary:Not Present- Blood in Urine, Urinary frequency, Weak urinary stream, Discharge, Flank Pain, Incontinence, Painful Urination, Urgency, Urinary Retention and Urinating at Night. Musculoskeletal:Not Present- Muscle Weakness, Muscle Pain, Joint Swelling, Joint Pain, Back Pain, Morning Stiffness and Spasms. Neurological:Not Present- Tremor, Dizziness, Blackout spells, Paralysis, Difficulty with balance and Weakness. Psychiatric:Not Present- Insomnia.    Vitals Pulse: 68 (Regular) Resp.: 14 (Unlabored) BP: 132/86 (Sitting, Left Arm,  Standard)     Physical Exam The physical exam findings are as follows:   General Mental Status - Alert, cooperative and good historian. General Appearance- pleasant. Not in acute distress. Orientation- Oriented X3. Build & Nutrition- Well nourished and Well developed.   Head and Neck Head- normocephalic, atraumatic . Neck Global Assessment- supple. no bruit auscultated on the right and no bruit auscultated on the left.   Eye Pupil- Bilateral- Regular and Round. Motion- Bilateral- EOMI.   Chest and Lung Exam Auscultation: Breath sounds:- clear at anterior chest wall and - clear at posterior chest wall. Adventitious sounds:- No Adventitious sounds.   Cardiovascular Auscultation:Rhythm- Regular rate and rhythm. Heart Sounds- S1 WNL and S2 WNL. Murmurs & Other Heart Sounds:Auscultation of the heart reveals - No Murmurs.   Abdomen Inspection:Contour- Generalized moderate distention. Palpation/Percussion:Tenderness- Abdomen is non-tender to palpation. Rigidity (guarding)- Abdomen is soft. Auscultation:Auscultation of the abdomen reveals - Bowel sounds normal.   Female Genitourinary Not done, not pertinent to present illness  Musculoskeletal  Left Knee showed no effusion. She has the varus deformity. Sensation and circulation are intact. Crepitation throughout the range of motion.  RADIOGRAPHS: X-rays pf the left knee shows advanced medial compartment and patellofemoral changes, moderate lateral compartment changes.   Assessment & Plan Primary osteoarthritis of both knees (715.16) Impression: Left Knee greater than Right Knee  Note: Plan is for a Left Total Knee Replacement and Right Knee Cortisone Injection by Dr. Wynelle Link.  Plan is to go to [facility] following the hospital stay.  PCP - Dr. Mertha Finders  The patient does not have any contraindications and will receive TXA (tranexamic acid) prior to surgery.  Signed  electronically by Joelene Millin, III PA-C

## 2013-11-02 NOTE — Interval H&P Note (Signed)
History and Physical Interval Note:  11/02/2013 7:04 AM  Debra Griffith  has presented today for surgery, with the diagnosis of OSTEOARTHRITIS LEFT KNEE  The various methods of treatment have been discussed with the patient and family. After consideration of risks, benefits and other options for treatment, the patient has consented to  Procedure(s): LEFT TOTAL KNEE ARTHROPLASTY (Left) as a surgical intervention .  The patient's history has been reviewed, patient examined, no change in status, stable for surgery.  I have reviewed the patient's chart and labs.  Questions were answered to the patient's satisfaction.     [name]

## 2013-11-02 NOTE — Op Note (Addendum)
Pre-operative diagnosis- Osteoarthritis  Bilateral knee(s)  Post-operative diagnosis- Osteoarthritis Bilateral knee(s)  Procedure-  Left  Total Knee Arthroplasty   Right knee cortisone injection  Surgeon- Debra Griffith. Debra Wehling, MD  Assistant- Debra Muslim, PA-C   Anesthesia-  Spinal EBL-* No blood loss amount entered *  Drains Hemovac  Tourniquet time-  Total Tourniquet Time Documented: Thigh (Left) - 34 minutes Total: Thigh (Left) - 34 minutes    Complications- None  Condition-PACU - hemodynamically stable.   Brief Clinical Note  Debra Griffith is a 73 y.o. year old female with end stage OA of her left knee with progressively worsening pain and dysfunction. She has constant pain, with activity and at rest and significant functional deficits with difficulties even with ADLs. She has had extensive non-op management including analgesics, injections of cortisone and viscosupplements, and home exercise program, but remains in significant pain with significant dysfunction. Radiographs show bone on bone arthritis medial and patellofemoral. She presents now for left Total Knee Arthroplasty.    Procedure in detail---   The patient is brought into the operating room and positioned supine on the operating table. After successful administration of  Spinal,   a tourniquet is placed high on the  Left thigh(s) and the lower extremity is prepped and draped in the usual sterile fashion. Time out is performed by the operating team and then the  Left lower extremity is wrapped in Esmarch, knee flexed and the tourniquet inflated to 300 mmHg.       A midline incision is made with a ten blade through the subcutaneous tissue to the level of the extensor mechanism. A fresh blade is used to make a medial parapatellar arthrotomy. Soft tissue over the proximal medial tibia is subperiosteally elevated to the joint line with a knife and into the semimembranosus bursa with a Cobb elevator. Soft tissue over the proximal  lateral tibia is elevated with attention being paid to avoiding the patellar tendon on the tibial tubercle. The patella is everted, knee flexed 90 degrees and the ACL and PCL are removed. Findings are bone on bone medial and patellofemoral with large medial osteophytes.        The drill is used to create a starting hole in the distal femur and the canal is thoroughly irrigated with sterile saline to remove the fatty contents. The 5 degree Left  valgus alignment guide is placed into the femoral canal and the distal femoral cutting block is pinned to remove 10 mm off the distal femur. Resection is made with an oscillating saw.      The tibia is subluxed forward and the menisci are removed. The extramedullary alignment guide is placed referencing proximally at the medial aspect of the tibial tubercle and distally along the second metatarsal axis and tibial crest. The block is pinned to remove 42mm off the more deficient medial  side. Resection is made with an oscillating saw. Size 3is the most appropriate size for the tibia and the proximal tibia is prepared with the modular drill and keel punch for that size.      The femoral sizing guide is placed and size 3 is most appropriate. Rotation is marked off the epicondylar axis and confirmed by creating a rectangular flexion gap at 90 degrees. The size 3 cutting block is pinned in this rotation and the anterior, posterior and chamfer cuts are made with the oscillating saw. The intercondylar block is then placed and that cut is made.      Trial size  3 tibial component, trial size 3 posterior stabilized femur and a 10  mm posterior stabilized rotating platform insert trial is placed. Full extension is achieved with excellent varus/valgus and anterior/posterior balance throughout full range of motion. The patella is everted and thickness measured to be 22  mm. Free hand resection is taken to 12 mm, a 35 template is placed, lug holes are drilled, trial patella is placed,  and it tracks normally. Osteophytes are removed off the posterior femur with the trial in place. All trials are removed and the cut bone surfaces prepared with pulsatile lavage. Cement is mixed and once ready for implantation, the size 3 tibial implant, size  3 posterior stabilized femoral component, and the size 35 patella are cemented in place and the patella is held with the clamp. The trial insert is placed and the knee held in full extension. The Exparel (20 ml mixed with 30 ml saline) and .25% Bupivicaine, are injected into the extensor mechanism, posterior capsule, medial and lateral gutters and subcutaneous tissues.  All extruded cement is removed and once the cement is hard the permanent 10 mm posterior stabilized rotating platform insert is placed into the tibial tray.      The wound is copiously irrigated with saline solution and the extensor mechanism closed over a hemovac drain with #1 PDS suture. The tourniquet is released for a total tourniquet time of 32  minutes. Flexion against gravity is 135 degrees and the patella tracks normally. Subcutaneous tissue is closed with 2.0 vicryl and subcuticular with running 4.0 Monocryl. The incision is cleaned and dried and steri-strips and a bulky sterile dressing are applied.       I then sterilely prepped the right knee with Betadine and injected it with 80 mg Depomedrol with no problems. The left knee is then placed into a knee immobilizer and the patient is awakened and transported to recovery in stable condition.      Please note that a surgical assistant was a medical necessity for this procedure in order to perform it in a safe and expeditious manner. Surgical assistant was necessary to retract the ligaments and vital neurovascular structures to prevent injury to them and also necessary for proper positioning of the limb to allow for anatomic placement of the prosthesis.   Debra Griffith Debra Griffith, MD    11/02/2013, 9:24 AM

## 2013-11-02 NOTE — Transfer of Care (Signed)
Immediate Anesthesia Transfer of Care Note  Patient: Debra Griffith  Procedure(s) Performed: Procedure(s): LEFT TOTAL KNEE ARTHROPLASTY WITH RIGHT KNEE CORTISONE INJECTION (N/A)  Patient Location: PACU  Anesthesia Type:MAC and Spinal  Level of Consciousness: awake, alert , oriented and patient cooperative  Airway & Oxygen Therapy: Patient Spontanous Breathing and Patient connected to face mask oxygen  Post-op Assessment: Report given to PACU RN and Post -op Vital signs reviewed and stable  Post vital signs: Reviewed and stable  Complications: No apparent anesthesia complications

## 2013-11-02 NOTE — Anesthesia Preprocedure Evaluation (Addendum)
Anesthesia Evaluation  Patient identified by MRN, date of birth, ID band Patient awake    Reviewed: Allergy & Precautions, H&P , NPO status , Patient's Chart, lab work & pertinent test results, reviewed documented beta blocker date and time   History of Anesthesia Complications (+) PONV  Airway Mallampati: II TM Distance: >3 FB Neck ROM: Full    Dental no notable dental hx.    Pulmonary neg pulmonary ROS,  breath sounds clear to auscultation  Pulmonary exam normal       Cardiovascular hypertension, Rhythm:Regular Rate:Normal  EKG no change from 2013. Ruled out for acute MI in ER last week. Interventricular conduction delay.   Neuro/Psych negative neurological ROS  negative psych ROS   GI/Hepatic negative GI ROS, Neg liver ROS,   Endo/Other  Morbid obesity  Renal/GU negative Renal ROS  negative genitourinary   Musculoskeletal negative musculoskeletal ROS (+)   Abdominal   Peds negative pediatric ROS (+)  Hematology negative hematology ROS (+)   Anesthesia Other Findings   Reproductive/Obstetrics negative OB ROS                         Anesthesia Physical Anesthesia Plan  ASA: III  Anesthesia Plan: Spinal   Post-op Pain Management:    Induction: Intravenous  Airway Management Planned: Simple Face Mask  Additional Equipment:   Intra-op Plan:   Post-operative Plan:   Informed Consent: I have reviewed the patients History and Physical, chart, labs and discussed the procedure including the risks, benefits and alternatives for the proposed anesthesia with the patient or authorized representative who has indicated his/her understanding and acceptance.   Dental advisory given  Plan Discussed with: CRNA  Anesthesia Plan Comments:         Anesthesia Quick Evaluation

## 2013-11-02 NOTE — Anesthesia Procedure Notes (Signed)
Spinal  Patient location during procedure: OR Staffing Anesthesiologist: Zavier Canela Performed by: anesthesiologist  Preanesthetic Checklist Completed: patient identified, site marked, surgical consent, pre-op evaluation, timeout performed, IV checked, risks and benefits discussed and monitors and equipment checked Spinal Block Patient position: sitting Prep: Betadine Patient monitoring: heart rate, continuous pulse ox and blood pressure Approach: right paramedian Location: L3-4 Injection technique: single-shot Needle Needle type: Spinocan  Needle gauge: 22 G Needle length: 9 cm Additional Notes Expiration date of kit checked and confirmed. Patient tolerated procedure well, without complications.     

## 2013-11-03 LAB — CBC
HCT: 35.3 % — ABNORMAL LOW (ref 36.0–46.0)
Hemoglobin: 11.6 g/dL — ABNORMAL LOW (ref 12.0–15.0)
MCH: 29.3 pg (ref 26.0–34.0)
MCHC: 32.9 g/dL (ref 30.0–36.0)
MCV: 89.1 fL (ref 78.0–100.0)
Platelets: 262 10*3/uL (ref 150–400)
RBC: 3.96 MIL/uL (ref 3.87–5.11)
RDW: 13.3 % (ref 11.5–15.5)
WBC: 12.6 10*3/uL — AB (ref 4.0–10.5)

## 2013-11-03 LAB — BASIC METABOLIC PANEL
BUN: 13 mg/dL (ref 6–23)
CHLORIDE: 103 meq/L (ref 96–112)
CO2: 24 mEq/L (ref 19–32)
CREATININE: 0.77 mg/dL (ref 0.50–1.10)
Calcium: 9.1 mg/dL (ref 8.4–10.5)
GFR calc Af Amer: >90 mL/min (ref >90)
GFR calc non Af Amer: 83 mL/min — ABNORMAL LOW (ref >90)
Glucose, Bld: 147 mg/dL — ABNORMAL HIGH (ref 70–99)
POTASSIUM: 4.7 meq/L (ref 3.7–5.3)
Sodium: 139 mEq/L (ref 137–147)

## 2013-11-03 MED ORDER — ZOLPIDEM TARTRATE 5 MG PO TABS
5.0000 mg | ORAL_TABLET | Freq: Once | ORAL | Status: AC
  Administered 2013-11-03: 5 mg via ORAL
  Filled 2013-11-03: qty 1

## 2013-11-03 MED ORDER — AMLODIPINE BESYLATE 10 MG PO TABS
10.0000 mg | ORAL_TABLET | Freq: Every day | ORAL | Status: DC
  Administered 2013-11-03 – 2013-11-05 (×3): 10 mg via ORAL
  Filled 2013-11-03 (×3): qty 1

## 2013-11-03 MED ORDER — IRBESARTAN 300 MG PO TABS
300.0000 mg | ORAL_TABLET | Freq: Every day | ORAL | Status: DC
  Administered 2013-11-03 – 2013-11-05 (×3): 300 mg via ORAL
  Filled 2013-11-03 (×3): qty 1

## 2013-11-03 MED ORDER — ZOLPIDEM TARTRATE 10 MG PO TABS
10.0000 mg | ORAL_TABLET | Freq: Every day | ORAL | Status: DC
Start: 2013-11-03 — End: 2013-11-05
  Administered 2013-11-03 – 2013-11-04 (×2): 10 mg via ORAL
  Filled 2013-11-03 (×2): qty 1

## 2013-11-03 NOTE — Progress Notes (Signed)
Physical Therapy Treatment Patient Details Name: Debra Griffith MRN: [id number] DOB: 08-30-1941 Today's Date: 11/03/2013 Time: 7829-5621 PT Time Calculation (min): 26 min  PT Assessment / Plan / Recommendation  History of Present Illness s/p L TKR   PT Comments   Pt ambulated short distances today, limited by dizziness.  Pt also performed exercises in recliner.  Follow Up Recommendations  SNF     Does the patient have the potential to tolerate intense rehabilitation     Barriers to Discharge        Equipment Recommendations  None recommended by PT    Recommendations for Other Services    Frequency 7X/week   Progress towards PT Goals Progress towards PT goals: Progressing toward goals  Plan Current plan remains appropriate    Precautions / Restrictions Precautions Precautions: Fall;Knee Required Braces or Orthoses: Knee Immobilizer - Left Knee Immobilizer - Left: Discontinue once straight leg raise with < 10 degree lag Restrictions Weight Bearing Restrictions: No Other Position/Activity Restrictions: WBAT   Pertinent Vitals/Pain BP after 12 feet ambulation: 166/95 mmHg 82 bpm Premedicated, ice packs applied, activity to tolerance    Mobility  Bed Mobility Overal bed mobility: Needs Assistance Bed Mobility: Supine to Sit Supine to sit: Min assist General bed mobility comments: verbal cues for self assist and technique, assist for L LE, HOB elevated and used rail Transfers Overall transfer level: Needs assistance Equipment used: Rolling walker (2 wheeled) Transfers: Sit to/from Stand Sit to Stand: Mod assist;+2 physical assistance Stand pivot transfers: Mod assist;+2 physical assistance General transfer comment: verbal cues for safe technique, assist to rise and steady and control descent Ambulation/Gait Ambulation/Gait assistance: Min assist Ambulation Distance (Feet): 12 Feet (x2) Assistive device: Rolling walker (2 wheeled) Gait Pattern/deviations: Step-to  pattern;Antalgic;Trunk flexed Gait velocity: decr General Gait Details: verbal cues for technique, RW distance, sequence, pt became dizzy so recliner brought behind pt, after short rest break pt able to ambulate another 12 feet    Exercises Total Joint Exercises Ankle Circles/Pumps: AROM;Both;15 reps Quad Sets: AROM;Both;15 reps Short Arc Quad: AROM;15 reps;Left Heel Slides: Left;AAROM Hip ABduction/ADduction: AROM;Left;15 reps   PT Diagnosis:    PT Problem List:   PT Treatment Interventions:     PT Goals (current goals can now be found in the care plan section)    Visit Information  Last PT Received On: 11/03/13 Assistance Needed: +2 History of Present Illness: s/p L TKR    Subjective Data      Cognition  Cognition Arousal/Alertness: Awake/alert Behavior During Therapy: Anxious;WFL for tasks assessed/performed Overall Cognitive Status: Within Functional Limits for tasks assessed    Balance     End of Session PT - End of Session Equipment Utilized During Treatment: Left knee immobilizer Activity Tolerance: Patient limited by pain;Patient limited by fatigue Patient left: in chair;with call bell/phone within reach;with family/visitor present   GP     Debra Griffith,Debra Griffith 11/03/2013, 1:25 PM Debra Griffith, PT, DPT 11/03/2013 Pager: [phone]

## 2013-11-03 NOTE — Anesthesia Postprocedure Evaluation (Addendum)
  Anesthesia Post-op Note  Patient: Debra Griffith  Procedure(s) Performed: Procedure(s) (LRB): LEFT TOTAL KNEE ARTHROPLASTY WITH RIGHT KNEE CORTISONE INJECTION (N/A)  Patient Location: PACU  Anesthesia Type: sab  Level of Consciousness: awake and alert   Airway and Oxygen Therapy: Patient Spontanous Breathing  Post-op Pain: mild  Post-op Assessment: Post-op Vital signs reviewed, Patient's Cardiovascular Status Stable, Respiratory Function Stable, Patent Airway and No signs of Nausea or vomiting  Last Vitals:  Filed Vitals:   11/03/13 0717  BP: 129/82  Pulse: 77  Temp: 36.9 C  Resp: 16    Post-op Vital Signs: stable   Complications: No apparent anesthesia complications

## 2013-11-03 NOTE — Progress Notes (Signed)
Physical Therapy Treatment Note   11/03/13 1400  PT Visit Information  Last PT Received On 11/03/13  Assistance Needed +2  History of Present Illness s/p L TKR  PT Time Calculation  PT Start Time 1400  PT Stop Time 1427  PT Time Calculation (min) 27 min  Subjective Data  Subjective Pt wished to ambulate again this afternoon.  Precautions  Precautions Fall;Knee  Required Braces or Orthoses Knee Immobilizer - Left  Knee Immobilizer - Left Discontinue once straight leg raise with < 10 degree lag  Restrictions  Other Position/Activity Restrictions WBAT  Cognition  Arousal/Alertness Awake/alert  Behavior During Therapy WFL for tasks assessed/performed  Overall Cognitive Status Within Functional Limits for tasks assessed  Bed Mobility  Overal bed mobility Needs Assistance  Bed Mobility Supine to Sit;Sit to Supine  Supine to sit Min assist  Sit to supine Mod assist  General bed mobility comments verbal cues for self assist and technique, assist for L LE, HOB elevated and used rail  Transfers  Overall transfer level Needs assistance  Equipment used Rolling walker (2 wheeled)  Transfers Sit to/from [employer] Transfers  Sit to [employer] safety/equipment  Stand pivot transfers +2 safety/equipment;Min assist  General transfer comment verbal cues for safe technique, assist to rise and steady and control descent  Ambulation/Gait  Ambulation/Gait assistance Min assist  Ambulation Distance (Feet) 25 Feet (x2)  Assistive device Rolling walker (2 wheeled)  Gait Pattern/deviations Step-to pattern;Antalgic  Gait velocity decr  General Gait Details verbal cues for technique, RW distance, sequence, pt required short rest break after 25 feet due to fatigue  PT - End of Session  Equipment Utilized During Treatment Left knee immobilizer;Gait belt  Activity Tolerance Patient limited by fatigue  Patient left in bed;with call bell/phone within reach  PT - Assessment/Plan  PT  Plan Current plan remains appropriate  PT Frequency 7X/week  Follow Up Recommendations SNF  PT equipment None recommended by PT  PT Goal Progression  Progress towards PT goals Progressing toward goals  PT General Charges  $$ ACUTE PT VISIT 1 Procedure  PT Treatments  $Gait Training 23-37 mins   Carmelia Bake, PT, DPT 11/03/2013 Pager: [phone]

## 2013-11-03 NOTE — Progress Notes (Signed)
OT Cancellation Note  Patient Details Name: Debra Griffith MRN: [id number] DOB: 1941/02/09   Cancelled Treatment:    Reason Eval/Treat Not Completed: Other (comment)  Noted plan is for snf for rehab.  OT eval likely not needed for admission.  Will defer Ot to next venue.  Leanor Voris 11/03/2013, 7:18 AM Lesle Chris, OTR/L [phone] 11/03/2013

## 2013-11-03 NOTE — Discharge Instructions (Addendum)
° °Dr. Frank Aluisio °Total Joint Specialist °[facility] °[address] °[place], [place] [address] °[phone] ° °TOTAL KNEE REPLACEMENT POSTOPERATIVE DIRECTIONS ° ° ° °Knee Rehabilitation, Guidelines Following Surgery  °Results after knee surgery are often greatly improved when you follow the exercise, range of motion and muscle strengthening exercises prescribed by your doctor. Safety measures are also important to protect the knee from further injury. Any time any of these exercises cause you to have increased pain or swelling in your knee joint, decrease the amount until you are comfortable again and slowly increase them. If you have problems or questions, call your caregiver or physical therapist for advice.  ° °HOME CARE INSTRUCTIONS  °Remove items at home which could result in a fall. This includes throw rugs or furniture in walking pathways.  °Continue medications as instructed at time of discharge. °You may have some home medications which will be placed on hold until you complete the course of blood thinner medication.  °You may start showering once you are discharged home but do not submerge the incision under water. Just pat the incision dry and apply a dry gauze dressing on daily. °Walk with walker as instructed.  °You may resume a sexual relationship in one month or when given the OK by  your doctor.  °· Use walker as long as suggested by your caregivers. °· Avoid periods of inactivity such as sitting longer than an hour when not asleep. This helps prevent blood clots.  °You may put full weight on your legs and walk as much as is comfortable.  °You may return to work once you are cleared by your doctor.  °Do not drive a car for 6 weeks or until released by you surgeon.  °· Do not drive while taking narcotics.  °Wear the elastic stockings for three weeks following surgery during the day but you may remove then at night. °Make sure you keep all of your appointments after your  operation with all of your doctors and caregivers. You should call the office at the above phone number and make an appointment for approximately two weeks after the date of your surgery. °Change the dressing daily and reapply a dry dressing each time. °Please pick up a stool softener and laxative for home use as long as you are requiring pain medications. °· Continue to use ice on the knee for pain and swelling from surgery. You may notice swelling that will progress down to the foot and ankle.  This is normal after surgery.  Elevate the leg when you are not up walking on it.   °It is important for you to complete the blood thinner medication as prescribed by your doctor. °· Continue to use the breathing machine which will help keep your temperature down.  It is common for your temperature to cycle up and down following surgery, especially at night when you are not up moving around and exerting yourself.  The breathing machine keeps your lungs expanded and your temperature down. ° °RANGE OF MOTION AND STRENGTHENING EXERCISES  °Rehabilitation of the knee is important following a knee injury or an operation. After just a few days of immobilization, the muscles of the thigh which control the knee become weakened and shrink (atrophy). Knee exercises are designed to build up the tone and strength of the thigh muscles and to improve knee motion. Often times heat used for twenty to thirty minutes before working out will loosen up your tissues and help with improving the   range of motion but do not use heat for the first two weeks following surgery. These exercises can be done on a training (exercise) mat, on the floor, on a table or on a bed. Use what ever works the best and is most comfortable for you Knee exercises include:  °Leg Lifts - While your knee is still immobilized in a splint or cast, you can do straight leg raises. Lift the leg to 60 degrees, hold for 3 sec, and slowly lower the leg. Repeat 10-20 times 2-3  times daily. Perform this exercise against resistance later as your knee gets better.  °Quad and Hamstring Sets - Tighten up the muscle on the front of the thigh (Quad) and hold for 5-10 sec. Repeat this 10-20 times hourly. Hamstring sets are done by pushing the foot backward against an object and holding for 5-10 sec. Repeat as with quad sets.  °A rehabilitation program following serious knee injuries can speed recovery and prevent re-injury in the future due to weakened muscles. Contact your doctor or a physical therapist for more information on knee rehabilitation.  ° °SKILLED REHAB INSTRUCTIONS: °If the patient is transferred to a skilled rehab facility following release from the hospital, a list of the current medications will be sent to the facility for the patient to continue.  When discharged from the skilled rehab facility, please have the facility set up the patient's Home Health Physical Therapy prior to being released. Also, the skilled facility will be responsible for providing the patient with their medications at time of release from the facility to include their pain medication, the muscle relaxants, and their blood thinner medication. If the patient is still at the rehab facility at time of the two week follow up appointment, the skilled rehab facility will also need to assist the patient in arranging follow up appointment in our office and any transportation needs. ° °MAKE SURE YOU:  °Understand these instructions.  °Will watch your condition.  °Will get help right away if you are not doing well or get worse.  ° ° °Pick up stool softner and laxative for home. °Do not submerge incision under water. °May shower. °Continue to use ice for pain and swelling from surgery. ° °Take Xarelto for two and a half more weeks, then discontinue Xarelto. °Once the patient has completed the blood thinner regimen, then take a Baby 81 mg Aspirin daily for four more weeks. ° °When discharged from the skilled rehab  facility, please have the facility set up the patient's Home Health Physical Therapy prior to being released.  Also provide the patient with their medications at time of release from the facility to include their pain medication, the muscle relaxants, and their blood thinner medication.  If the patient is still at the rehab facility at time of follow up appointment, please also assist the patient in arranging follow up appointment in our office and any transportation needs. ° ° ° °Information on my medicine - XARELTO® (Rivaroxaban) ° °This medication education was reviewed with me or my healthcare representative as part of my discharge preparation.  The pharmacist that spoke with me during my hospital stay was:  Absher, Randall K, RPH ° °Why was Xarelto® prescribed for you? °Xarelto® was prescribed for you to reduce the risk of blood clots forming after orthopedic surgery. The medical term for these abnormal blood clots is venous thromboembolism (VTE). ° °What do you need to know about xarelto® ? °Take your Xarelto® ONCE DAILY at the same time every   day. °You may take it either with or without food. ° °If you have difficulty swallowing the tablet whole, you may crush it and mix in applesauce just prior to taking your dose. ° °Take Xarelto® exactly as prescribed by your doctor and DO NOT stop taking Xarelto® without talking to the doctor who prescribed the medication.  Stopping without other VTE prevention medication to take the place of Xarelto® may increase your risk of developing a clot. ° °After discharge, you should have regular check-up appointments with your healthcare provider that is prescribing your Xarelto®.   ° °What do you do if you miss a dose? °If you miss a dose, take it as soon as you remember on the same day then continue your regularly scheduled once daily regimen the next day. Do not take two doses of Xarelto® on the same day.  ° °Important Safety Information °A possible side effect of Xarelto® is  bleeding. You should call your healthcare provider right away if you experience any of the following: °  Bleeding from an injury or your nose that does not stop. °  Unusual colored urine (red or dark brown) or unusual colored stools (red or black). °  Unusual bruising for unknown reasons. °  A serious fall or if you hit your head (even if there is no bleeding). ° °Some medicines may interact with Xarelto® and might increase your risk of bleeding while on Xarelto®. To help avoid this, consult your healthcare provider or pharmacist prior to using any new prescription or non-prescription medications, including herbals, vitamins, non-steroidal anti-inflammatory drugs (NSAIDs) and supplements. ° °This website has more information on Xarelto®: www.xarelto.com. ° ° °

## 2013-11-03 NOTE — Progress Notes (Signed)
   Subjective: 1 Day Post-Op Procedure(s) (LRB): LEFT TOTAL KNEE ARTHROPLASTY WITH RIGHT KNEE CORTISONE INJECTION (N/A) Patient reports pain as moderate.   Husband in room. Patient seen in rounds with Dr. Wynelle Link. Tough night but better this morning. Patient is well, but has had some minor complaints of pain in the knee, requiring pain medications We will start therapy today.  Plan is to go Skilled nursing facility after hospital stay.  Objective: Vital signs in last 24 hours: Temp:  [97.4 F (36.3 C)-98.4 F (36.9 C)] 98.4 F (36.9 C) (02/17 0717) Pulse Rate:  [66-109] 77 (02/17 0717) Resp:  [8-16] 16 (02/17 0717) BP: (94-146)/(49-82) 129/82 mmHg (02/17 0717) SpO2:  [96 %-100 %] 100 % (02/17 0717) Weight:  [113.399 kg (250 lb)] 113.399 kg (250 lb) (02/16 2230)  Intake/Output from previous day:  Intake/Output Summary (Last 24 hours) at 11/03/13 0923 Last data filed at 11/03/13 3664  Gross per 24 hour  Intake   3580 ml  Output   4520 ml  Net   -940 ml    Intake/Output this shift: Total I/O In: 240 [P.O.:240] Out: -   Labs:  Recent Labs  11/03/13 0448  HGB 11.6*    Recent Labs  11/03/13 0448  WBC 12.6*  RBC 3.96  HCT 35.3*  PLT 262    Recent Labs  11/03/13 0448  NA 139  K 4.7  CL 103  CO2 24  BUN 13  CREATININE 0.77  GLUCOSE 147*  CALCIUM 9.1   No results found for this basename: LABPT, INR,  in the last 72 hours  EXAM General - Patient is Alert, Appropriate and Oriented Extremity - Neurovascular intact Sensation intact distally Dressing - dressing C/D/I Motor Function - intact, moving foot and toes well on exam.  Hemovac pulled without difficulty.  Past Medical History  Diagnosis Date  . Complication of anesthesia     severe diarrhea and vomiting after hysterectomy   . PONV (postoperative nausea and vomiting)   . Hypertension   . Dysrhythmia     [facility] - > 1 year ago   . Anxiety   . GERD (gastroesophageal reflux disease)   . Arthritis    . Anemia     hx of   . DDD (degenerative disc disease), lumbar   . Obesity   . Insomnia   . Depression   . H/O bronchitis   . Vitamin D deficiency     Assessment/Plan: 1 Day Post-Op Procedure(s) (LRB): LEFT TOTAL KNEE ARTHROPLASTY WITH RIGHT KNEE CORTISONE INJECTION (N/A) Principal Problem:   OA (osteoarthritis) of knee  Estimated body mass index is 47.26 kg/(m^2) as calculated from the following:   Height as of this encounter: 5\' 1"  (1.549 m).   Weight as of this encounter: 113.399 kg (250 lb). Advance diet Up with therapy Discharge to SNF  DVT Prophylaxis - Xarelto Weight-Bearing as tolerated to both leg D/C O2 and Pulse OX and try on Room [address]  Mickel Crow 11/03/2013, 9:23 AM

## 2013-11-04 LAB — BASIC METABOLIC PANEL
BUN: 16 mg/dL (ref 6–23)
CO2: 24 mEq/L (ref 19–32)
Calcium: 9.3 mg/dL (ref 8.4–10.5)
Chloride: 99 mEq/L (ref 96–112)
Creatinine, Ser: 0.74 mg/dL (ref 0.50–1.10)
GFR, EST NON AFRICAN AMERICAN: 84 mL/min — AB (ref >90)
GLUCOSE: 121 mg/dL — AB (ref 70–99)
Potassium: 4.1 mEq/L (ref 3.7–5.3)
Sodium: 135 mEq/L — ABNORMAL LOW (ref 137–147)

## 2013-11-04 LAB — CBC
HCT: 33.1 % — ABNORMAL LOW (ref 36.0–46.0)
HEMOGLOBIN: 10.9 g/dL — AB (ref 12.0–15.0)
MCH: 29.2 pg (ref 26.0–34.0)
MCHC: 32.9 g/dL (ref 30.0–36.0)
MCV: 88.7 fL (ref 78.0–100.0)
Platelets: 255 10*3/uL (ref 150–400)
RBC: 3.73 MIL/uL — ABNORMAL LOW (ref 3.87–5.11)
RDW: 13.7 % (ref 11.5–15.5)
WBC: 12.4 10*3/uL — ABNORMAL HIGH (ref 4.0–10.5)

## 2013-11-04 NOTE — Progress Notes (Signed)
11/04/13 1700  PT Visit Information  Last PT Received On 11/04/13  Assistance Needed +1  History of Present Illness s/p L TKR  PT Time Calculation  PT Start Time 1650  PT Stop Time 1708  PT Time Calculation (min) 18 min  Precautions  Precautions Fall;Knee  Required Braces or Orthoses Knee Immobilizer - Left  Knee Immobilizer - Left Discontinue once straight leg raise with < 10 degree lag  Restrictions  Other Position/Activity Restrictions WBAT  Cognition  Arousal/Alertness Awake/alert  Behavior During Therapy WFL for tasks assessed/performed  Overall Cognitive Status Within Functional Limits for tasks assessed  Bed Mobility  Overal bed mobility Needs Assistance  Bed Mobility Supine to Sit;Sit to Supine  Supine to sit Min assist  Sit to supine Mod assist  General bed mobility comments verbal cues for self assist and technique, assist for L LE, HOB elevated and used rail  Transfers  Overall transfer level Needs assistance  Equipment used Rolling walker (2 wheeled)  Transfers Sit to/from [employer]  Sit to [employer] safety/equipment  Stand pivot transfers Min assist  General transfer comment verbal cues for safe technique, assist to rise and steady and control descent  Total Joint Exercises  Ankle Circles/Pumps AROM;Both;15 reps  Quad Sets AROM;Both;15 reps  Short Arc Quad AROM;15 reps;Left  Heel Slides Left;AAROM  Hip ABduction/ADduction AROM;Left;15 reps  PT - End of Session  Equipment Utilized During Treatment Left knee immobilizer;Gait belt  Activity Tolerance Patient tolerated treatment well  Patient left in chair;with call bell/phone within reach  Nurse Communication Mobility status  PT - Assessment/Plan  PT Plan Current plan remains appropriate  PT Frequency 7X/week  Follow Up Recommendations SNF  PT equipment None recommended by PT  PT Goal Progression  Progress towards PT goals Progressing toward goals  Acute Rehab PT Goals  Time  For Goal Achievement 11/09/13  Potential to Achieve Goals Good  PT General Charges  $$ ACUTE PT VISIT 1 Procedure  PT Treatments  $Therapeutic Exercise 8-22 mins

## 2013-11-04 NOTE — Progress Notes (Signed)
Physical Therapy Treatment Patient Details Name: Debra Griffith MRN: [id number] DOB: 1941-09-07 Today's Date: 11/04/2013 Time: 4098-1191 PT Time Calculation (min): 28 min  PT Assessment / Plan / Recommendation  History of Present Illness s/p L TKR   PT Comments   Pt will benefit from continued PT at SNF level  Follow Up Recommendations  SNF     Does the patient have the potential to tolerate intense rehabilitation     Barriers to Discharge        Equipment Recommendations  None recommended by PT    Recommendations for Other Services    Frequency 7X/week   Progress towards PT Goals Progress towards PT goals: Progressing toward goals  Plan Current plan remains appropriate    Precautions / Restrictions Precautions Precautions: Fall;Knee Required Braces or Orthoses: Knee Immobilizer - Left Knee Immobilizer - Left: Discontinue once straight leg raise with < 10 degree lag Restrictions Other Position/Activity Restrictions: WBAT   Pertinent Vitals/Pain Pain controlled   Mobility  Bed Mobility Overal bed mobility: Needs Assistance Bed Mobility: Supine to Sit Supine to sit: Min assist General bed mobility comments: verbal cues for self assist and technique, assist for L LE, HOB elevated and used rail Transfers Overall transfer level: Needs assistance Equipment used: Rolling walker (2 wheeled) Transfers: Sit to/from [facility] Sit to Stand: Min assist;+2 safety/equipment Stand pivot transfers: +2 safety/equipment;Min assist General transfer comment: verbal cues for safe technique, assist to rise and steady and control descent Ambulation/Gait Ambulation/Gait assistance: Min assist;+2 safety/equipment Ambulation Distance (Feet): 50 Feet (x 2 with sitting rest between) Assistive device: Rolling walker (2 wheeled) Gait Pattern/deviations: Step-to pattern Gait velocity: decr General Gait Details: verbal cues for technique, RW distance, sequence, pt required  short rest break after 25 feet due to fatigue    Exercises     PT Diagnosis:    PT Problem List:   PT Treatment Interventions:     PT Goals (current goals can now be found in the care plan section) Acute Rehab PT Goals Time For Goal Achievement: 11/09/13 Potential to Achieve Goals: Good  Visit Information  Last PT Received On: 11/04/13 Assistance Needed: +2 History of Present Illness: s/p L TKR    Subjective Data      Cognition  Cognition Arousal/Alertness: Awake/alert Behavior During Therapy: WFL for tasks assessed/performed Overall Cognitive Status: Within Functional Limits for tasks assessed    Balance     End of Session PT - End of Session Equipment Utilized During Treatment: Left knee immobilizer;Gait belt Activity Tolerance: Patient tolerated treatment well Patient left: in chair;with call bell/phone within reach Nurse Communication: Mobility status   GP     [facility] 11/04/2013, 2:42 PM

## 2013-11-04 NOTE — Progress Notes (Signed)
   Subjective: 2 Days Post-Op Procedure(s) (LRB): LEFT TOTAL KNEE ARTHROPLASTY WITH RIGHT KNEE CORTISONE INJECTION (N/A) Patient reports pain as mild.   Patient seen in rounds with Dr. Wynelle Link. Patient is well, and has had no acute complaints or problems Plan is to go Skilled nursing facility after hospital stay.  Objective: Vital signs in last 24 hours: Temp:  [97.8 F (36.6 C)-99.3 F (37.4 C)] 97.9 F (36.6 C) (02/18 0523) Pulse Rate:  [73-85] 82 (02/18 0523) Resp:  [16-20] 16 (02/18 0523) BP: (121-147)/(69-85) 138/69 mmHg (02/18 0523) SpO2:  [96 %-100 %] 96 % (02/18 0523)  Intake/Output from previous day:  Intake/Output Summary (Last 24 hours) at 11/04/13 0738 Last data filed at 11/04/13 0149  Gross per 24 hour  Intake 1294.5 ml  Output   3800 ml  Net -2505.5 ml    Intake/Output this shift:    Labs:  Recent Labs  11/03/13 0448 11/04/13 0403  HGB 11.6* 10.9*    Recent Labs  11/03/13 0448 11/04/13 0403  WBC 12.6* 12.4*  RBC 3.96 3.73*  HCT 35.3* 33.1*  PLT 262 255    Recent Labs  11/03/13 0448 11/04/13 0403  NA 139 135*  K 4.7 4.1  CL 103 99  CO2 24 24  BUN 13 16  CREATININE 0.77 0.74  GLUCOSE 147* 121*  CALCIUM 9.1 9.3   No results found for this basename: LABPT, INR,  in the last 72 hours  EXAM General - Patient is Alert, Appropriate and Oriented Extremity - Neurovascular intact Sensation intact distally Dressing/Incision - clean, dry, no drainage, healing Motor Function - intact, moving foot and toes well on exam.   Past Medical History  Diagnosis Date  . Complication of anesthesia     severe diarrhea and vomiting after hysterectomy   . PONV (postoperative nausea and vomiting)   . Hypertension   . Dysrhythmia     [facility] - > 1 year ago   . Anxiety   . GERD (gastroesophageal reflux disease)   . Arthritis   . Anemia     hx of   . DDD (degenerative disc disease), lumbar   . Obesity   . Insomnia   . Depression   . H/O bronchitis    . Vitamin D deficiency     Assessment/Plan: 2 Days Post-Op Procedure(s) (LRB): LEFT TOTAL KNEE ARTHROPLASTY WITH RIGHT KNEE CORTISONE INJECTION (N/A) Principal Problem:   OA (osteoarthritis) of knee  Estimated body mass index is 47.26 kg/(m^2) as calculated from the following:   Height as of this encounter: 5\' 1"  (1.549 m).   Weight as of this encounter: 113.399 kg (250 lb). Up with therapy Plan for discharge tomorrow Discharge to SNF  DVT Prophylaxis - Xarelto Weight-Bearing as tolerated to both leg  PERKINS, ALEXZANDREW 11/04/2013, 7:38 AM

## 2013-11-05 DIAGNOSIS — Z96659 Presence of unspecified artificial knee joint: Secondary | ICD-10-CM | POA: Diagnosis not present

## 2013-11-05 DIAGNOSIS — D62 Acute posthemorrhagic anemia: Secondary | ICD-10-CM | POA: Diagnosis not present

## 2013-11-05 DIAGNOSIS — I10 Essential (primary) hypertension: Secondary | ICD-10-CM | POA: Diagnosis not present

## 2013-11-05 DIAGNOSIS — M1990 Unspecified osteoarthritis, unspecified site: Secondary | ICD-10-CM | POA: Diagnosis not present

## 2013-11-05 DIAGNOSIS — E669 Obesity, unspecified: Secondary | ICD-10-CM | POA: Diagnosis not present

## 2013-11-05 DIAGNOSIS — S99919A Unspecified injury of unspecified ankle, initial encounter: Secondary | ICD-10-CM | POA: Diagnosis not present

## 2013-11-05 DIAGNOSIS — IMO0002 Reserved for concepts with insufficient information to code with codable children: Secondary | ICD-10-CM | POA: Diagnosis not present

## 2013-11-05 DIAGNOSIS — M25569 Pain in unspecified knee: Secondary | ICD-10-CM | POA: Diagnosis not present

## 2013-11-05 DIAGNOSIS — R279 Unspecified lack of coordination: Secondary | ICD-10-CM | POA: Diagnosis not present

## 2013-11-05 DIAGNOSIS — K219 Gastro-esophageal reflux disease without esophagitis: Secondary | ICD-10-CM | POA: Diagnosis not present

## 2013-11-05 DIAGNOSIS — M6281 Muscle weakness (generalized): Secondary | ICD-10-CM | POA: Diagnosis not present

## 2013-11-05 DIAGNOSIS — F411 Generalized anxiety disorder: Secondary | ICD-10-CM | POA: Diagnosis not present

## 2013-11-05 DIAGNOSIS — Z471 Aftercare following joint replacement surgery: Secondary | ICD-10-CM | POA: Diagnosis not present

## 2013-11-05 DIAGNOSIS — R269 Unspecified abnormalities of gait and mobility: Secondary | ICD-10-CM | POA: Diagnosis not present

## 2013-11-05 DIAGNOSIS — D649 Anemia, unspecified: Secondary | ICD-10-CM | POA: Diagnosis not present

## 2013-11-05 DIAGNOSIS — M1710 Unilateral primary osteoarthritis, unspecified knee: Secondary | ICD-10-CM | POA: Diagnosis not present

## 2013-11-05 DIAGNOSIS — S8990XA Unspecified injury of unspecified lower leg, initial encounter: Secondary | ICD-10-CM | POA: Diagnosis not present

## 2013-11-05 DIAGNOSIS — G4700 Insomnia, unspecified: Secondary | ICD-10-CM | POA: Diagnosis not present

## 2013-11-05 LAB — CBC
HCT: 33.6 % — ABNORMAL LOW (ref 36.0–46.0)
Hemoglobin: 10.7 g/dL — ABNORMAL LOW (ref 12.0–15.0)
MCH: 28.8 pg (ref 26.0–34.0)
MCHC: 31.8 g/dL (ref 30.0–36.0)
MCV: 90.3 fL (ref 78.0–100.0)
Platelets: 248 10*3/uL (ref 150–400)
RBC: 3.72 MIL/uL — AB (ref 3.87–5.11)
RDW: 13.8 % (ref 11.5–15.5)
WBC: 11.7 10*3/uL — ABNORMAL HIGH (ref 4.0–10.5)

## 2013-11-05 MED ORDER — METOCLOPRAMIDE HCL 5 MG PO TABS: 5.0000 mg | ORAL_TABLET | Freq: Three times a day (TID) | ORAL | Status: DC | PRN

## 2013-11-05 MED ORDER — POLYETHYLENE GLYCOL 3350 17 G PO PACK: 17.0000 g | PACK | Freq: Every day | ORAL | Status: DC | PRN

## 2013-11-05 MED ORDER — BISACODYL 10 MG RE SUPP: 10.0000 mg | Freq: Every day | RECTAL | Status: DC | PRN

## 2013-11-05 MED ORDER — RIVAROXABAN 10 MG PO TABS: 10.0000 mg | ORAL_TABLET | Freq: Every day | ORAL | Status: DC

## 2013-11-05 MED ORDER — METHOCARBAMOL 500 MG PO TABS: 500.0000 mg | ORAL_TABLET | Freq: Four times a day (QID) | ORAL | Status: DC | PRN

## 2013-11-05 MED ORDER — ONDANSETRON HCL 4 MG PO TABS: 4.0000 mg | ORAL_TABLET | Freq: Four times a day (QID) | ORAL | Status: DC | PRN

## 2013-11-05 MED ORDER — OXYCODONE HCL 5 MG PO TABS: 5.0000 mg | ORAL_TABLET | ORAL | Status: DC | PRN

## 2013-11-05 MED ORDER — TRAMADOL HCL 50 MG PO TABS: 50.0000 mg | ORAL_TABLET | Freq: Four times a day (QID) | ORAL | Status: DC | PRN

## 2013-11-05 MED ORDER — ACETAMINOPHEN 325 MG PO TABS: 650.0000 mg | ORAL_TABLET | Freq: Four times a day (QID) | ORAL | Status: DC | PRN

## 2013-11-05 MED ORDER — DSS 100 MG PO CAPS: 100.0000 mg | ORAL_CAPSULE | Freq: Two times a day (BID) | ORAL | Status: DC

## 2013-11-05 NOTE — Progress Notes (Signed)
Physical Therapy Treatment Patient Details Name: Debra Griffith MRN: [id number] DOB: 1941-03-11 Today's Date: 11/05/2013 Time: 0865-7846 PT Time Calculation (min): 39 min  PT Assessment / Plan / Recommendation  History of Present Illness s/p L TKR   PT Comments   Pt doing well, still fatigues easily; can't do I SLR but does not really like to wear KI  Follow Up Recommendations  SNF     Does the patient have the potential to tolerate intense rehabilitation     Barriers to Discharge        Equipment Recommendations  None recommended by PT    Recommendations for Other Services    Frequency 7X/week   Progress towards PT Goals Progress towards PT goals: Progressing toward goals  Plan Current plan remains appropriate    Precautions / Restrictions Precautions Precautions: Fall;Knee Required Braces or Orthoses: Knee Immobilizer - Left Knee Immobilizer - Left: Discontinue once straight leg raise with < 10 degree lag Restrictions Weight Bearing Restrictions: No Other Position/Activity Restrictions: WBAT   Pertinent Vitals/Pain Min c/o pain    Mobility  Bed Mobility Overal bed mobility: Needs Assistance Bed Mobility: Supine to Sit Supine to sit: Min assist General bed mobility comments: verbal cues for self assist and technique, assist for L LE, HOB elevated and used rail; incr time to complete task Transfers Equipment used: Rolling walker (2 wheeled) Transfers: Sit to/from Stand Sit to Stand: Min assist Stand pivot transfers: Min assist General transfer comment: verbal cues for safe technique, assist to rise and steady and control descent Ambulation/Gait Ambulation/Gait assistance: Min guard Ambulation Distance (Feet): 55 Feet Assistive device: Rolling walker (2 wheeled) Gait Pattern/deviations: Step-to pattern;Antalgic Gait velocity: decr General Gait Details: verbal cues for technique, RW distance, sequence, pt  required chair due to fatigue after 50'    Exercises  Total Joint Exercises Ankle Circles/Pumps: AROM;Both;15 reps   PT Diagnosis:    PT Problem List:   PT Treatment Interventions:     PT Goals (current goals can now be found in the care plan section) Acute Rehab PT Goals PT Goal Formulation: With patient Time For Goal Achievement: 11/09/13 Potential to Achieve Goals: Good  Visit Information  Last PT Received On: 11/05/13 Assistance Needed: +1 History of Present Illness: s/p L TKR    Subjective Data      Cognition  Cognition Arousal/Alertness: Awake/alert Behavior During Therapy: WFL for tasks assessed/performed Overall Cognitive Status: Within Functional Limits for tasks assessed    Balance     End of Session PT - End of Session Equipment Utilized During Treatment: Left knee immobilizer;Gait belt Activity Tolerance: Patient tolerated treatment well Patient left: in chair;with call bell/phone within reach Nurse Communication: Mobility status   GP     [facility] 11/05/2013, 10:27 AM

## 2013-11-05 NOTE — Progress Notes (Signed)
Report called to [place] to Olene Craven RN

## 2013-11-05 NOTE — Progress Notes (Signed)
   Subjective: 3 Days Post-Op Procedure(s) (LRB): LEFT TOTAL KNEE ARTHROPLASTY WITH RIGHT KNEE CORTISONE INJECTION (N/A) Patient reports pain as mild.   Patient seen in rounds with Dr. Wynelle Link. Patient is well, and has had no acute complaints or problems Patient is ready to go to [facility]  Objective: Vital signs in last 24 hours: Temp:  [98.3 F (36.8 C)-98.7 F (37.1 C)] 98.7 F (37.1 C) (02/19 0526) Pulse Rate:  [88-100] 89 (02/19 0526) Resp:  [16] 16 (02/19 0526) BP: (116-139)/(65-84) 116/71 mmHg (02/19 0526) SpO2:  [97 %-98 %] 97 % (02/19 0526)  Intake/Output from previous day:  Intake/Output Summary (Last 24 hours) at 11/05/13 0845 Last data filed at 11/05/13 5170  Gross per 24 hour  Intake    720 ml  Output   2800 ml  Net  -2080 ml    Intake/Output this shift: Total I/O In: -  Out: 200 [Urine:200]  Labs:  Recent Labs  11/03/13 0448 11/04/13 0403 11/05/13 0420  HGB 11.6* 10.9* 10.7*    Recent Labs  11/04/13 0403 11/05/13 0420  WBC 12.4* 11.7*  RBC 3.73* 3.72*  HCT 33.1* 33.6*  PLT 255 248    Recent Labs  11/03/13 0448 11/04/13 0403  NA 139 135*  K 4.7 4.1  CL 103 99  CO2 24 24  BUN 13 16  CREATININE 0.77 0.74  GLUCOSE 147* 121*  CALCIUM 9.1 9.3   No results found for this basename: LABPT, INR,  in the last 72 hours  EXAM: General - Patient is Alert and Appropriate Extremity - Neurovascular intact Sensation intact distally Incision - clean, dry Motor Function - intact, moving foot and toes well on exam.   Assessment/Plan: 3 Days Post-Op Procedure(s) (LRB): LEFT TOTAL KNEE ARTHROPLASTY WITH RIGHT KNEE CORTISONE INJECTION (N/A) Procedure(s) (LRB): LEFT TOTAL KNEE ARTHROPLASTY WITH RIGHT KNEE CORTISONE INJECTION (N/A) Past Medical History  Diagnosis Date  . Complication of anesthesia     severe diarrhea and vomiting after hysterectomy   . PONV (postoperative nausea and vomiting)   . Hypertension   . Dysrhythmia     [facility] - >  1 year ago   . Anxiety   . GERD (gastroesophageal reflux disease)   . Arthritis   . Anemia     hx of   . DDD (degenerative disc disease), lumbar   . Obesity   . Insomnia   . Depression   . H/O bronchitis   . Vitamin D deficiency    Principal Problem:   OA (osteoarthritis) of knee Active Problems:   Postop Hyponatremia   Postop Acute blood loss anemia  Estimated body mass index is 47.26 kg/(m^2) as calculated from the following:   Height as of this encounter: 5\' 1"  (1.549 m).   Weight as of this encounter: 113.399 kg (250 lb). Up with therapy Discharge to SNF Diet - Regular diet Follow up - in 2 weeks Activity - WBAT Disposition - Skilled nursing facility Condition Upon Discharge - Good D/C Meds - See DC Summary DVT Prophylaxis - Xarelto  Debra Griffith 11/05/2013, 8:45 AM

## 2013-11-05 NOTE — Care Management Note (Signed)
    Page 1 of 1   11/05/2013     3:41:45 PM   CARE MANAGEMENT NOTE 11/05/2013  Patient:  Debra Griffith, Debra Griffith   Account Number:  [id number]  Date Initiated:  11/05/2013  Documentation initiated by:  Sherrin Daisy  Subjective/Objective Assessment:   dx left knee replacemnt     Action/Plan:   SNF rehab   Anticipated DC Date:  11/05/2013   Anticipated DC Plan:  SKILLED NURSING FACILITY  In-house referral  Clinical Social Worker      DC Planning Services  CM consult      Choice offered to / List presented to:             Status of service:  Completed, signed off Medicare Important Message given?  NA - LOS <3 / Initial given by admissions (If response is "NO", the following Medicare IM given date fields will be blank) Date Medicare IM given:   Date Additional Medicare IM given:    Discharge Disposition:  [place]  Per UR Regulation:    If discussed at Long Length of Stay Meetings, dates discussed:    Comments:

## 2013-11-05 NOTE — Progress Notes (Signed)
Clinical Social Work Department CLINICAL SOCIAL WORK PLACEMENT NOTE 11/05/2013  Patient:  Debra Griffith, Debra Griffith  Account Number:  [id number] Admit date:  11/02/2013  Clinical Social Worker:  Werner Lean, LCSW  Date/time:  11/02/2013 04:28 PM  Clinical Social Work is seeking post-discharge placement for this patient at the following level of care:   SKILLED NURSING   (*CSW will update this form in Epic as items are completed)     Patient/family provided with [place] Department of Clinical Social Work's list of facilities offering this level of care within the geographic area requested by the patient (or if unable, by the patient's family).  11/02/2013  Patient/family informed of their freedom to choose among providers that offer the needed level of care, that participate in Medicare, Medicaid or managed care program needed by the patient, have an available bed and are willing to accept the patient.    Patient/family informed of [facility]' ownership interest in [facility], as well as of the fact that they are under no obligation to receive care at this facility.  PASARR submitted to [employer] on  PASARR number received from [employer] on 06/24/2002  FL2 transmitted to all facilities in geographic area requested by pt/family on  11/02/2013 FL2 transmitted to all facilities within larger geographic area on   Patient informed that his/her managed care company has contracts with or will negotiate with  certain facilities, including the following:     Patient/family informed of bed offers received:  11/02/2013 Patient chooses bed at [facility] Physician recommends and patient chooses bed at    Patient to be transferred to [place] on  11/05/2013 Patient to be transferred to facility by [facility]  The following physician request were entered in Epic:   Additional Comments:  Pt is aware that insurance may not cover transport to SNF.  Werner Lean LCSW [phone]

## 2013-11-05 NOTE — Discharge Summary (Signed)
Physician Discharge Summary   Patient ID: Debra Griffith MRN: [id number] DOB/AGE: [birth year] 73 y.o.  Admit date: 11/02/2013 Discharge date: 11-05-2013  Primary Diagnosis:  Osteoarthritis Bilateral knee(s)  Admission Diagnoses:  Past Medical History  Diagnosis Date  . Complication of anesthesia     severe diarrhea and vomiting after hysterectomy   . PONV (postoperative nausea and vomiting)   . Hypertension   . Dysrhythmia     [facility] - > 1 year ago   . Anxiety   . GERD (gastroesophageal reflux disease)   . Arthritis   . Anemia     hx of   . DDD (degenerative disc disease), lumbar   . Obesity   . Insomnia   . Depression   . H/O bronchitis   . Vitamin D deficiency    Discharge Diagnoses:   Principal Problem:   OA (osteoarthritis) of knee Active Problems:   Postop Hyponatremia   Postop Acute blood loss anemia  Estimated body mass index is 47.26 kg/(m^2) as calculated from the following:   Height as of this encounter: _0  (1.549 m).   Weight as of this encounter: 113.399 kg (250 lb).  Procedure:  Procedure(s) (LRB): LEFT TOTAL KNEE ARTHROPLASTY WITH RIGHT KNEE CORTISONE INJECTION (N/A)   Consults: None  HPI: Debra Griffith is a 73 y.o. year old female with end stage OA of her left knee with progressively worsening pain and dysfunction. She has constant pain, with activity and at rest and significant functional deficits with difficulties even with ADLs. She has had extensive non-op management including analgesics, injections of cortisone and viscosupplements, and home exercise program, but remains in significant pain with significant dysfunction. Radiographs show bone on bone arthritis medial and patellofemoral. She presents now for left Total Knee Arthroplasty.   Laboratory Data: Admission on 11/02/2013  Component Date Value Ref Range Status  . ABO/RH(D) 11/02/2013 A POS   Final  . Antibody Screen 11/02/2013 NEG   Final  . Sample Expiration 11/02/2013 11/05/2013    Final  . WBC 11/03/2013 12.6* 4.0 - 10.5 K/uL Final  . RBC 11/03/2013 3.96  3.87 - 5.11 MIL/uL Final  . Hemoglobin 11/03/2013 11.6* 12.0 - 15.0 g/dL Final  . HCT 11/03/2013 35.3* 36.0 - 46.0 % Final  . MCV 11/03/2013 89.1  78.0 - 100.0 fL Final  . MCH 11/03/2013 29.3  26.0 - 34.0 pg Final  . MCHC 11/03/2013 32.9  30.0 - 36.0 g/dL Final  . RDW 11/03/2013 13.3  11.5 - 15.5 % Final  . Platelets 11/03/2013 262  150 - 400 K/uL Final  . Sodium 11/03/2013 139  137 - 147 mEq/L Final  . Potassium 11/03/2013 4.7  3.7 - 5.3 mEq/L Final  . Chloride 11/03/2013 103  96 - 112 mEq/L Final  . CO2 11/03/2013 24  19 - 32 mEq/L Final  . Glucose, Bld 11/03/2013 147* 70 - 99 mg/dL Final  . BUN 11/03/2013 13  6 - 23 mg/dL Final  . Creatinine, Ser 11/03/2013 0.77  0.50 - 1.10 mg/dL Final  . Calcium 11/03/2013 9.1  8.4 - 10.5 mg/dL Final  . GFR calc non Af Amer 11/03/2013 83* >90 mL/min Final  . GFR calc Af Amer 11/03/2013 >90  >90 mL/min Final   Comment: (NOTE)                          The eGFR has been calculated using the CKD EPI equation.  This calculation has not been validated in all clinical situations.                          eGFR's persistently <90 mL/min signify possible Chronic Kidney                          Disease.  . WBC 11/04/2013 12.4* 4.0 - 10.5 K/uL Final  . RBC 11/04/2013 3.73* 3.87 - 5.11 MIL/uL Final  . Hemoglobin 11/04/2013 10.9* 12.0 - 15.0 g/dL Final  . HCT 11/04/2013 33.1* 36.0 - 46.0 % Final  . MCV 11/04/2013 88.7  78.0 - 100.0 fL Final  . MCH 11/04/2013 29.2  26.0 - 34.0 pg Final  . MCHC 11/04/2013 32.9  30.0 - 36.0 g/dL Final  . RDW 11/04/2013 13.7  11.5 - 15.5 % Final  . Platelets 11/04/2013 255  150 - 400 K/uL Final  . Sodium 11/04/2013 135* 137 - 147 mEq/L Final  . Potassium 11/04/2013 4.1  3.7 - 5.3 mEq/L Final  . Chloride 11/04/2013 99  96 - 112 mEq/L Final  . CO2 11/04/2013 24  19 - 32 mEq/L Final  . Glucose, Bld 11/04/2013 121* 70 - 99 mg/dL  Final  . BUN 11/04/2013 16  6 - 23 mg/dL Final  . Creatinine, Ser 11/04/2013 0.74  0.50 - 1.10 mg/dL Final  . Calcium 11/04/2013 9.3  8.4 - 10.5 mg/dL Final  . GFR calc non Af Amer 11/04/2013 84* >90 mL/min Final  . GFR calc Af Amer 11/04/2013 >90  >90 mL/min Final   Comment: (NOTE)                          The eGFR has been calculated using the CKD EPI equation.                          This calculation has not been validated in all clinical situations.                          eGFR's persistently <90 mL/min signify possible Chronic Kidney                          Disease.  . WBC 11/05/2013 11.7* 4.0 - 10.5 K/uL Final  . RBC 11/05/2013 3.72* 3.87 - 5.11 MIL/uL Final  . Hemoglobin 11/05/2013 10.7* 12.0 - 15.0 g/dL Final  . HCT 11/05/2013 33.6* 36.0 - 46.0 % Final  . MCV 11/05/2013 90.3  78.0 - 100.0 fL Final  . MCH 11/05/2013 28.8  26.0 - 34.0 pg Final  . MCHC 11/05/2013 31.8  30.0 - 36.0 g/dL Final  . RDW 11/05/2013 13.8  11.5 - 15.5 % Final  . Platelets 11/05/2013 248  150 - 400 K/uL Final  Admission on 10/23/2013, Discharged on 10/23/2013  Component Date Value Ref Range Status  . WBC 10/23/2013 6.7  4.0 - 10.5 K/uL Final  . RBC 10/23/2013 4.11  3.87 - 5.11 MIL/uL Final  . Hemoglobin 10/23/2013 11.8* 12.0 - 15.0 g/dL Final  . HCT 10/23/2013 36.5  36.0 - 46.0 % Final  . MCV 10/23/2013 88.8  78.0 - 100.0 fL Final  . MCH 10/23/2013 28.7  26.0 - 34.0 pg Final  . MCHC 10/23/2013 32.3  30.0 - 36.0 g/dL Final  . RDW 10/23/2013 13.7  11.5 - 15.5 % Final  . Platelets 10/23/2013 293  150 - 400 K/uL Final  . Neutrophils Relative % 10/23/2013 50  43 - 77 % Final  . Neutro Abs 10/23/2013 3.3  1.7 - 7.7 K/uL Final  . Lymphocytes Relative 10/23/2013 38  12 - 46 % Final  . Lymphs Abs 10/23/2013 2.6  0.7 - 4.0 K/uL Final  . Monocytes Relative 10/23/2013 8  3 - 12 % Final  . Monocytes Absolute 10/23/2013 0.5  0.1 - 1.0 K/uL Final  . Eosinophils Relative 10/23/2013 4  0 - 5 % Final  .  Eosinophils Absolute 10/23/2013 0.3  0.0 - 0.7 K/uL Final  . Basophils Relative 10/23/2013 0  0 - 1 % Final  . Basophils Absolute 10/23/2013 0.0  0.0 - 0.1 K/uL Final  . Sodium 10/23/2013 138  137 - 147 mEq/L Final  . Potassium 10/23/2013 3.4* 3.7 - 5.3 mEq/L Final  . Chloride 10/23/2013 101  96 - 112 mEq/L Final  . CO2 10/23/2013 23  19 - 32 mEq/L Final  . Glucose, Bld 10/23/2013 116* 70 - 99 mg/dL Final  . BUN 10/23/2013 23  6 - 23 mg/dL Final  . Creatinine, Ser 10/23/2013 1.13* 0.50 - 1.10 mg/dL Final  . Calcium 10/23/2013 9.7  8.4 - 10.5 mg/dL Final  . Total Protein 10/23/2013 7.4  6.0 - 8.3 g/dL Final  . Albumin 10/23/2013 4.3  3.5 - 5.2 g/dL Final  . AST 10/23/2013 19  0 - 37 U/L Final  . ALT 10/23/2013 19  0 - 35 U/L Final  . Alkaline Phosphatase 10/23/2013 103  39 - 117 U/L Final  . Total Bilirubin 10/23/2013 0.2* 0.3 - 1.2 mg/dL Final  . GFR calc non Af Amer 10/23/2013 48* >90 mL/min Final  . GFR calc Af Amer 10/23/2013 55* >90 mL/min Final   Comment: (NOTE)                          The eGFR has been calculated using the CKD EPI equation.                          This calculation has not been validated in all clinical situations.                          eGFR's persistently <90 mL/min signify possible Chronic Kidney                          Disease.  . Troponin I 10/23/2013 <0.30  <0.30 ng/mL Final   Comment:                                 Due to the release kinetics of cTnI,                          a negative result within the first hours                          of the onset of symptoms does not rule out                          myocardial infarction with certainty.  If myocardial infarction is still suspected,                          repeat the test at appropriate intervals.  Hospital Outpatient Visit on 10/23/2013  Component Date Value Ref Range Status  . MRSA, PCR 10/23/2013 NEGATIVE  NEGATIVE Final  . Staphylococcus aureus 10/23/2013  NEGATIVE  NEGATIVE Final   Comment:                                 The Xpert SA Assay (FDA                          approved for NASAL specimens                          in patients over 32 years of age),                          is one component of                          a comprehensive surveillance                          program.  Test performance has                          been validated by [employer] for patients greater                          than or equal to 64 year old.                          It is not intended                          to diagnose infection nor to                          guide or monitor treatment.  Marland Kitchen aPTT 10/23/2013 28  24 - 37 seconds Final  . WBC 10/23/2013 7.0  4.0 - 10.5 K/uL Final  . RBC 10/23/2013 4.31  3.87 - 5.11 MIL/uL Final  . Hemoglobin 10/23/2013 12.8  12.0 - 15.0 g/dL Final  . HCT 10/23/2013 38.6  36.0 - 46.0 % Final  . MCV 10/23/2013 89.6  78.0 - 100.0 fL Final  . MCH 10/23/2013 29.7  26.0 - 34.0 pg Final  . MCHC 10/23/2013 33.2  30.0 - 36.0 g/dL Final  . RDW 10/23/2013 13.7  11.5 - 15.5 % Final  . Platelets 10/23/2013 304  150 - 400 K/uL Final  . Sodium 10/23/2013 139  137 - 147 mEq/L Final  . Potassium 10/23/2013 3.9  3.7 - 5.3 mEq/L Final  . Chloride 10/23/2013 102  96 - 112 mEq/L Final  . CO2 10/23/2013 25  19 - 32 mEq/L Final  . Glucose, Bld 10/23/2013 113* 70 - 99 mg/dL Final  .  BUN 10/23/2013 23  6 - 23 mg/dL Final  . Creatinine, Ser 10/23/2013 1.15* 0.50 - 1.10 mg/dL Final  . Calcium 10/23/2013 9.9  8.4 - 10.5 mg/dL Final  . Total Protein 10/23/2013 7.3  6.0 - 8.3 g/dL Final  . Albumin 10/23/2013 4.3  3.5 - 5.2 g/dL Final  . AST 10/23/2013 18  0 - 37 U/L Final  . ALT 10/23/2013 19  0 - 35 U/L Final  . Alkaline Phosphatase 10/23/2013 104  39 - 117 U/L Final  . Total Bilirubin 10/23/2013 0.2* 0.3 - 1.2 mg/dL Final  . GFR calc non Af Amer 10/23/2013 47* >90 mL/min Final  . GFR calc Af Amer  10/23/2013 54* >90 mL/min Final   Comment: (NOTE)                          The eGFR has been calculated using the CKD EPI equation.                          This calculation has not been validated in all clinical situations.                          eGFR's persistently <90 mL/min signify possible Chronic Kidney                          Disease.  Marland Kitchen Prothrombin Time 10/23/2013 13.2  11.6 - 15.2 seconds Final  . INR 10/23/2013 1.02  0.00 - 1.49 Final  . Color, Urine 10/23/2013 YELLOW  YELLOW Final  . APPearance 10/23/2013 CLEAR  CLEAR Final  . Specific Gravity, Urine 10/23/2013 1.019  1.005 - 1.030 Final  . pH 10/23/2013 6.0  5.0 - 8.0 Final  . Glucose, UA 10/23/2013 NEGATIVE  NEGATIVE mg/dL Final  . Hgb urine dipstick 10/23/2013 NEGATIVE  NEGATIVE Final  . Bilirubin Urine 10/23/2013 NEGATIVE  NEGATIVE Final  . Ketones, ur 10/23/2013 NEGATIVE  NEGATIVE mg/dL Final  . Protein, ur 10/23/2013 NEGATIVE  NEGATIVE mg/dL Final  . Urobilinogen, UA 10/23/2013 0.2  0.0 - 1.0 mg/dL Final  . Nitrite 10/23/2013 NEGATIVE  NEGATIVE Final  . Leukocytes, UA 10/23/2013 NEGATIVE  NEGATIVE Final   MICROSCOPIC NOT DONE ON URINES WITH NEGATIVE PROTEIN, BLOOD, LEUKOCYTES, NITRITE, OR GLUCOSE <1000 mg/dL.     X-Rays:Dg Chest 2 View  10/23/2013   CLINICAL DATA:  Irregular heart beat on preoperative evaluation.  EXAM: CHEST  2 VIEW  COMPARISON:  DG CHEST 2 VIEW dated 06/18/2012  FINDINGS: Cardiopericardial silhouette within normal limits. Mediastinal contours normal. Trachea midline. No airspace disease or effusion. Mild tortuosity of the thoracic aorta is unchanged.  IMPRESSION: No active cardiopulmonary disease.   Electronically Signed   By: Dereck Ligas M.D.   On: 10/23/2013 15:29    EKG: Orders placed during the hospital encounter of 10/23/13  . ED EKG  . ED EKG     Hospital Course: Debra Griffith is a 73 y.o. who was admitted to [facility]. They were brought to the operating room on 11/02/2013  and underwent Procedure(s): LEFT TOTAL KNEE ARTHROPLASTY WITH RIGHT KNEE CORTISONE INJECTION.  Patient tolerated the procedure well and was later transferred to the recovery room and then to the orthopaedic floor for postoperative care.  They were given PO and IV analgesics for pain control following their surgery.  They were given 24 hours  of postoperative antibiotics of  Anti-infectives   Start     Dose/Rate Route Frequency Ordered Stop   11/02/13 1400  ceFAZolin (ANCEF) IVPB 2 g/50 mL premix     2 g 100 mL/hr over 30 Minutes Intravenous Every 6 hours 11/02/13 1122 11/02/13 2031     and started on DVT prophylaxis in the form of Xarelto.   PT and OT were ordered for total joint protocol.  Discharge planning consulted to help with postop disposition and equipment needs.  Patient had a tough night on the evening of surgery.  They started to get up OOB with therapy on day one. Hemovac drain was pulled without difficulty.  Continued to work with therapy into day two.  Dressing was changed on day two and the incision was healing well.  By day three, the patient had progressed with therapy and meeting their goals.  Incision was healing well.  Patient was seen in rounds and was ready to go to [facility].   Discharge Medications: Prior to Admission medications   Medication Sig Start Date End Date Taking? Authorizing Provider  albuterol (PROVENTIL HFA;VENTOLIN HFA) 108 (90 BASE) MCG/ACT inhaler Inhale 2 puffs into the lungs every 6 (six) hours as needed. Wheezing and shortness of breath   Yes Historical Provider, MD  amLODipine-valsartan (EXFORGE) 10-320 MG per tablet Take 1 tablet by mouth daily before breakfast.   Yes Historical Provider, MD  clonazePAM (KLONOPIN) 1 MG tablet Take 1 mg by mouth at bedtime.   Yes Historical Provider, MD  cyclobenzaprine (FLEXERIL) 10 MG tablet Take 20 mg by mouth at bedtime.   Yes Historical Provider, MD  ferrous sulfate 325 (65 FE) MG tablet Take 325 mg by mouth daily  with breakfast.   Yes Historical Provider, MD  metoprolol tartrate (LOPRESSOR) 25 MG tablet Take 50 mg by mouth at bedtime.    Yes Historical Provider, MD  potassium chloride SA (K-DUR,KLOR-CON) 20 MEQ tablet Take 20 mEq by mouth 2 (two) times daily.   Yes Historical Provider, MD  ranitidine (ZANTAC) 150 MG tablet Take 300 mg by mouth 2 (two) times daily.    Yes Historical Provider, MD  triamterene-hydrochlorothiazide (MAXZIDE-25) 37.5-25 MG per tablet Take 1 tablet by mouth daily before breakfast.   Yes Historical Provider, MD  zolpidem (AMBIEN) 10 MG tablet Take 10 mg by mouth at bedtime. Sleep   Yes Historical Provider, MD  acetaminophen (TYLENOL) 325 MG tablet Take 2 tablets (650 mg total) by mouth every 6 (six) hours as needed for mild pain (or Fever >/= 101). 11/05/13   Alexzandrew Dara Lords, PA-C  bisacodyl (DULCOLAX) 10 MG suppository Place 1 suppository (10 mg total) rectally daily as needed for moderate constipation. 11/05/13   Alexzandrew Perkins, PA-C  docusate sodium 100 MG CAPS Take 100 mg by mouth 2 (two) times daily. 11/05/13   Alexzandrew Dara Lords, PA-C  methocarbamol (ROBAXIN) 500 MG tablet Take 1 tablet (500 mg total) by mouth every 6 (six) hours as needed for muscle spasms. 11/05/13   Alexzandrew Dara Lords, PA-C  metoCLOPramide (REGLAN) 5 MG tablet Take 1-2 tablets (5-10 mg total) by mouth every 8 (eight) hours as needed for nausea (if ondansetron (ZOFRAN) ineffective.). 11/05/13   Alexzandrew Perkins, PA-C  ondansetron (ZOFRAN) 4 MG tablet Take 1 tablet (4 mg total) by mouth every 6 (six) hours as needed for nausea. 11/05/13   Alexzandrew Perkins, PA-C  oxyCODONE (OXY IR/ROXICODONE) 5 MG immediate release tablet Take 1-2 tablets (5-10 mg total) by mouth every 3 (three) hours  as needed for moderate pain, severe pain or breakthrough pain. 11/05/13   Alexzandrew Perkins, PA-C  polyethylene glycol (MIRALAX / GLYCOLAX) packet Take 17 g by mouth daily as needed for mild constipation. 11/05/13    Alexzandrew Dara Lords, PA-C  rivaroxaban (XARELTO) 10 MG TABS tablet Take 1 tablet (10 mg total) by mouth daily with breakfast. Take Xarelto for two and a half more weeks, then discontinue Xarelto. Once the patient has completed the blood thinner regimen, then take a Baby 81 mg Aspirin daily for four more weeks. 11/05/13   Alexzandrew Perkins, PA-C  traMADol (ULTRAM) 50 MG tablet Take 1-2 tablets (50-100 mg total) by mouth every 6 (six) hours as needed for moderate pain. 11/05/13   Alexzandrew Dara Lords, PA-C    Discharge to SNF  Diet - Regular diet  Follow up - in 2 weeks  Activity - [facility]  Disposition - Skilled nursing facility  Condition Upon Discharge - Good  D/C Meds - See DC Summary  DVT Prophylaxis - Xarelto   Discharge Orders   Future Orders Complete By Expires   Call MD / Call 911  As directed    Comments:     If you experience chest pain or shortness of breath, CALL 911 and be transported to the hospital emergency room.  If you develope a fever above 101 F, pus (white drainage) or increased drainage or redness at the wound, or calf pain, call your surgeon's office.   Change dressing  As directed    Comments:     Change dressing daily with sterile 4 x 4 inch gauze dressing and apply TED hose. Do not submerge the incision under water.   Constipation Prevention  As directed    Comments:     Drink plenty of fluids.  Prune juice may be helpful.  You may use a stool softener, such as Colace (over the counter) 100 mg twice a day.  Use MiraLax (over the counter) for constipation as needed.   Diet general  As directed    Discharge instructions  As directed    Comments:     Pick up stool softner and laxative for home. Do not submerge incision under water. May shower. Continue to use ice for pain and swelling from surgery.  Take Xarelto for two and a half more weeks, then discontinue Xarelto. Once the patient has completed the blood thinner regimen, then take a Baby 81 mg Aspirin daily  for four more weeks.  When discharged from the skilled rehab facility, please have the facility set up the patient's [place] prior to being released.  Also provide the patient with their medications at time of release from the facility to include their pain medication, the muscle relaxants, and their blood thinner medication.  If the patient is still at the rehab facility at time of follow up appointment, please also assist the patient in arranging follow up appointment in our office and any transportation needs.   Do not put a pillow under the knee. Place it under the heel.  As directed    Do not sit on low chairs, stoools or toilet seats, as it may be difficult to get up from low surfaces  As directed    Driving restrictions  As directed    Comments:     No driving until released by the physician.   Increase activity slowly as tolerated  As directed    Lifting restrictions  As directed    Comments:  No lifting until released by the physician.   Patient may shower  As directed    Comments:     You may shower without a dressing once there is no drainage.  Do not wash over the wound.  If drainage remains, do not shower until drainage stops.   TED hose  As directed    Comments:     Use stockings (TED hose) for 3 weeks on both leg(s).  You may remove them at night for sleeping.   Weight bearing as tolerated  As directed    Questions:     Laterality:     Extremity:         Medication List    STOP taking these medications       aspirin EC 81 MG tablet     BIOFREEZE ROLL-ON EX     cholecalciferol 1000 UNITS tablet  Commonly known as:  VITAMIN D     HYDROcodone-acetaminophen 10-325 MG per tablet  Commonly known as:  NORCO     multivitamin with minerals Tabs tablet     naproxen sodium 220 MG tablet  Commonly known as:  ANAPROX     NON FORMULARY      TAKE these medications       acetaminophen 325 MG tablet  Commonly known as:  TYLENOL  Take 2 tablets  (650 mg total) by mouth every 6 (six) hours as needed for mild pain (or Fever >/= 101).     albuterol 108 (90 BASE) MCG/ACT inhaler  Commonly known as:  PROVENTIL HFA;VENTOLIN HFA  Inhale 2 puffs into the lungs every 6 (six) hours as needed. Wheezing and shortness of breath     amLODipine-valsartan 10-320 MG per tablet  Commonly known as:  EXFORGE  Take 1 tablet by mouth daily before breakfast.     bisacodyl 10 MG suppository  Commonly known as:  DULCOLAX  Place 1 suppository (10 mg total) rectally daily as needed for moderate constipation.     clonazePAM 1 MG tablet  Commonly known as:  KLONOPIN  Take 1 mg by mouth at bedtime.     cyclobenzaprine 10 MG tablet  Commonly known as:  FLEXERIL  Take 20 mg by mouth at bedtime.     DSS 100 MG Caps  Take 100 mg by mouth 2 (two) times daily.     ferrous sulfate 325 (65 FE) MG tablet  Take 325 mg by mouth daily with breakfast.     methocarbamol 500 MG tablet  Commonly known as:  ROBAXIN  Take 1 tablet (500 mg total) by mouth every 6 (six) hours as needed for muscle spasms.     metoCLOPramide 5 MG tablet  Commonly known as:  REGLAN  Take 1-2 tablets (5-10 mg total) by mouth every 8 (eight) hours as needed for nausea (if ondansetron (ZOFRAN) ineffective.).     metoprolol tartrate 25 MG tablet  Commonly known as:  LOPRESSOR  Take 50 mg by mouth at bedtime.     ondansetron 4 MG tablet  Commonly known as:  ZOFRAN  Take 1 tablet (4 mg total) by mouth every 6 (six) hours as needed for nausea.     oxyCODONE 5 MG immediate release tablet  Commonly known as:  Oxy IR/ROXICODONE  Take 1-2 tablets (5-10 mg total) by mouth every 3 (three) hours as needed for moderate pain, severe pain or breakthrough pain.     polyethylene glycol packet  Commonly known as:  MIRALAX / GLYCOLAX  Take 17 g  by mouth daily as needed for mild constipation.     potassium chloride SA 20 MEQ tablet  Commonly known as:  K-DUR,KLOR-CON  Take 20 mEq by mouth 2  (two) times daily.     ranitidine 150 MG tablet  Commonly known as:  ZANTAC  Take 300 mg by mouth 2 (two) times daily.     rivaroxaban 10 MG Tabs tablet  Commonly known as:  XARELTO  - Take 1 tablet (10 mg total) by mouth daily with breakfast. Take Xarelto for two and a half more weeks, then discontinue Xarelto.  - Once the patient has completed the blood thinner regimen, then take a Baby 81 mg Aspirin daily for four more weeks.     traMADol 50 MG tablet  Commonly known as:  ULTRAM  Take 1-2 tablets (50-100 mg total) by mouth every 6 (six) hours as needed for moderate pain.     triamterene-hydrochlorothiazide 37.5-25 MG per tablet  Commonly known as:  MAXZIDE-25  Take 1 tablet by mouth daily before breakfast.     zolpidem 10 MG tablet  Commonly known as:  AMBIEN  Take 10 mg by mouth at bedtime. Sleep           Follow-up Information   Follow up with [name], MD. Schedule an appointment as soon as possible for a visit in 2 weeks.   Specialty:  Orthopedic Surgery   Contact information:   [address] [place] [address] [phone]       Signed: Mickel Crow 11/05/2013, 8:53 AM

## 2013-11-06 ENCOUNTER — Other Ambulatory Visit: Payer: Self-pay | Admitting: *Deleted

## 2013-11-06 MED ORDER — ZOLPIDEM TARTRATE 10 MG PO TABS: ORAL_TABLET | ORAL | Status: DC

## 2013-11-06 NOTE — Telephone Encounter (Signed)
[facility] 

## 2013-11-09 ENCOUNTER — Non-Acute Institutional Stay (SKILLED_NURSING_FACILITY): Payer: Medicare Other | Admitting: Adult Health

## 2013-11-09 DIAGNOSIS — I10 Essential (primary) hypertension: Secondary | ICD-10-CM

## 2013-11-09 DIAGNOSIS — IMO0002 Reserved for concepts with insufficient information to code with codable children: Secondary | ICD-10-CM | POA: Diagnosis not present

## 2013-11-09 DIAGNOSIS — D62 Acute posthemorrhagic anemia: Secondary | ICD-10-CM

## 2013-11-09 DIAGNOSIS — M1710 Unilateral primary osteoarthritis, unspecified knee: Secondary | ICD-10-CM | POA: Diagnosis not present

## 2013-11-09 DIAGNOSIS — F419 Anxiety disorder, unspecified: Secondary | ICD-10-CM

## 2013-11-09 DIAGNOSIS — F411 Generalized anxiety disorder: Secondary | ICD-10-CM

## 2013-11-09 DIAGNOSIS — K5900 Constipation, unspecified: Secondary | ICD-10-CM

## 2013-11-09 DIAGNOSIS — G4700 Insomnia, unspecified: Secondary | ICD-10-CM

## 2013-11-09 DIAGNOSIS — Z96659 Presence of unspecified artificial knee joint: Secondary | ICD-10-CM | POA: Diagnosis not present

## 2013-11-09 DIAGNOSIS — M179 Osteoarthritis of knee, unspecified: Secondary | ICD-10-CM

## 2013-11-09 DIAGNOSIS — K219 Gastro-esophageal reflux disease without esophagitis: Secondary | ICD-10-CM

## 2013-11-10 ENCOUNTER — Other Ambulatory Visit: Payer: Self-pay

## 2013-11-10 MED ORDER — TRAMADOL HCL 50 MG PO TABS: ORAL_TABLET | ORAL | Status: DC

## 2013-11-10 NOTE — Telephone Encounter (Signed)
Rx faxed to [facility] @ [phone], phone number [phone]  

## 2013-11-13 ENCOUNTER — Non-Acute Institutional Stay (SKILLED_NURSING_FACILITY): Payer: Medicare Other | Admitting: Internal Medicine

## 2013-11-13 ENCOUNTER — Encounter: Payer: Self-pay | Admitting: Internal Medicine

## 2013-11-13 DIAGNOSIS — Z96659 Presence of unspecified artificial knee joint: Secondary | ICD-10-CM

## 2013-11-13 DIAGNOSIS — IMO0002 Reserved for concepts with insufficient information to code with codable children: Secondary | ICD-10-CM

## 2013-11-13 DIAGNOSIS — K219 Gastro-esophageal reflux disease without esophagitis: Secondary | ICD-10-CM

## 2013-11-13 DIAGNOSIS — D62 Acute posthemorrhagic anemia: Secondary | ICD-10-CM

## 2013-11-13 DIAGNOSIS — M1710 Unilateral primary osteoarthritis, unspecified knee: Secondary | ICD-10-CM | POA: Diagnosis not present

## 2013-11-13 DIAGNOSIS — Z96653 Presence of artificial knee joint, bilateral: Secondary | ICD-10-CM | POA: Insufficient documentation

## 2013-11-13 DIAGNOSIS — I10 Essential (primary) hypertension: Secondary | ICD-10-CM | POA: Diagnosis not present

## 2013-11-13 DIAGNOSIS — M179 Osteoarthritis of knee, unspecified: Secondary | ICD-10-CM

## 2013-11-13 HISTORY — DX: Presence of artificial knee joint, bilateral: Z96.653

## 2013-11-13 NOTE — Assessment & Plan Note (Signed)
Continue zantac and prn reglan

## 2013-11-13 NOTE — Assessment & Plan Note (Signed)
Hb 11.8 to 10.7; continue ferrous sulfate daily

## 2013-11-13 NOTE — Assessment & Plan Note (Signed)
Maxzide, lopressor, exforge-continue all

## 2013-11-13 NOTE — Progress Notes (Signed)
MRN: [id number] Name: Debra Griffith  Sex: female Age: 73 y.o. DOB: December 30, 1940  [place] #: [address] Facility/Room: [id number] Level Of Care: SNF Provider: Inocencio Homes D Emergency Contacts: Extended Emergency Contact Information Primary Emergency Contact: Fraleigh,Roger Address: [address]          [place], [place] [address] Johnnette Litter of [place] Phone: [phone] Work Phone: [phone] Mobile Phone: [phone] Relation: Spouse Secondary Emergency Contact: Liz Beach, [place] of [place] Mobile Phone: [phone] Relation: Son     Allergies: Morphine and related  Chief Complaint  Patient presents with  . nursing home admission    HPI: Patient is 73 y.o. female who id admitted to SNF for OT/PT after L knee arthroscopy.  Past Medical History  Diagnosis Date  . Complication of anesthesia     severe diarrhea and vomiting after hysterectomy   . PONV (postoperative nausea and vomiting)   . Dysrhythmia     [facility] - > 1 year ago   . Anxiety   . Arthritis   . Anemia     hx of   . DDD (degenerative disc disease), lumbar   . Obesity   . Insomnia   . Depression   . H/O bronchitis   . Vitamin D deficiency   . Hypertension   . GERD (gastroesophageal reflux disease)     Past Surgical History  Procedure Laterality Date  . Cesarean section      x 3  . Wisdom tooth extraction  1970's    admitted to hospital for surgery  . Abdominal hysterectomy    . Total hip arthroplasty  06/24/2012    Procedure: TOTAL HIP ARTHROPLASTY;  Surgeon: [name], MD;  Location: [facility];  Service: Orthopedics;  Laterality: Left;  . Total knee arthroplasty N/A 11/02/2013    Procedure: LEFT TOTAL KNEE ARTHROPLASTY WITH RIGHT KNEE CORTISONE INJECTION;  Surgeon: [name], MD;  Location: [facility];  Service: Orthopedics;  Laterality: N/A;      Medication List       This list is accurate as of: 11/13/13 10:03 PM.  Always use your most recent med list.                acetaminophen 325 MG tablet  Commonly known as:  TYLENOL  Take 2 tablets (650 mg total) by mouth every 6 (six) hours as needed for mild pain (or Fever >/= 101).     albuterol 108 (90 BASE) MCG/ACT inhaler  Commonly known as:  PROVENTIL HFA;VENTOLIN HFA  Inhale 2 puffs into the lungs every 6 (six) hours as needed. Wheezing and shortness of breath     amLODipine-valsartan 10-320 MG per tablet  Commonly known as:  EXFORGE  Take 1 tablet by mouth daily before breakfast.     bisacodyl 10 MG suppository  Commonly known as:  DULCOLAX  Place 1 suppository (10 mg total) rectally daily as needed for moderate constipation.     clonazePAM 1 MG tablet  Commonly known as:  KLONOPIN  Take 1 mg by mouth at bedtime.     cyclobenzaprine 10 MG tablet  Commonly known as:  FLEXERIL  Take 20 mg by mouth at bedtime.     DSS 100 MG Caps  Take 100 mg by mouth 2 (two) times daily.     ferrous sulfate 325 (65 FE) MG tablet  Take 325 mg by mouth daily with breakfast.     methocarbamol 500  MG tablet  Commonly known as:  ROBAXIN  Take 1 tablet (500 mg total) by mouth every 6 (six) hours as needed for muscle spasms.     metoCLOPramide 5 MG tablet  Commonly known as:  REGLAN  Take 1-2 tablets (5-10 mg total) by mouth every 8 (eight) hours as needed for nausea (if ondansetron (ZOFRAN) ineffective.).     metoprolol tartrate 25 MG tablet  Commonly known as:  LOPRESSOR  Take 50 mg by mouth at bedtime.     ondansetron 4 MG tablet  Commonly known as:  ZOFRAN  Take 1 tablet (4 mg total) by mouth every 6 (six) hours as needed for nausea.     oxyCODONE 5 MG immediate release tablet  Commonly known as:  Oxy IR/ROXICODONE  Take 1-2 tablets (5-10 mg total) by mouth every 3 (three) hours as needed for moderate pain, severe pain or breakthrough pain.     polyethylene glycol packet  Commonly known as:  MIRALAX / GLYCOLAX  Take 17 g by mouth daily as needed for mild constipation.     potassium  chloride SA 20 MEQ tablet  Commonly known as:  K-DUR,KLOR-CON  Take 20 mEq by mouth 2 (two) times daily.     ranitidine 150 MG tablet  Commonly known as:  ZANTAC  Take 300 mg by mouth 2 (two) times daily.     rivaroxaban 10 MG Tabs tablet  Commonly known as:  XARELTO  - Take 1 tablet (10 mg total) by mouth daily with breakfast. Take Xarelto for two and a half more weeks, then discontinue Xarelto.  - Once the patient has completed the blood thinner regimen, then take a Baby 81 mg Aspirin daily for four more weeks.     traMADol 50 MG tablet  Commonly known as:  ULTRAM  1 by mouth every six hours routinely     triamterene-hydrochlorothiazide 37.5-25 MG per tablet  Commonly known as:  MAXZIDE-25  Take 1 tablet by mouth daily before breakfast.     zolpidem 10 MG tablet  Commonly known as:  AMBIEN  Take one tablet by mouth every night at bedtime for insomnia        No orders of the defined types were placed in this encounter.     There is no immunization history on file for this patient.  History  Substance Use Topics  . Smoking status: Never Smoker   . Smokeless tobacco: Never Used  . Alcohol Use: No    Family history is noncontributory    Review of Systems  DATA OBTAINED: from patient GENERAL: Feels well no fevers, fatigue, appetite changes SKIN: No itching, rash or wounds EYES: No eye pain, redness, discharge EARS: No earache, tinnitus, change in hearing NOSE: No congestion, drainage or bleeding  MOUTH/THROAT: No mouth or tooth pain, No sore throat, No difficulty chewing or swallowing  RESPIRATORY: No cough, wheezing, SOB CARDIAC: No chest pain, palpitations, lower extremity edema  GI: No abdominal pain, No N/V/D or constipation, No heartburn or reflux  GU: No dysuria, frequency or urgency, or incontinence  MUSCULOSKELETAL: has some pain with rehab;would like pain med before rehab;it doesn't make her sleepy or weak NEUROLOGIC: No headache, dizziness or focal  weakness PSYCHIATRIC: No overt anxiety or sadness. Sleeps well. No behavior issue.   Filed Vitals:   11/13/13 2119  BP: 123/84  Pulse: 86  Temp: 99.7 F (37.6 C)  Resp: 21    Physical Exam  GENERAL APPEARANCE: Alert, conversant. Appropriately groomed. No acute  distress.  SKIN: No diaphoresis rash; incision covered with dressing ;no redness or  swelling HEAD: Normocephalic, atraumatic  EYES: Conjunctiva/lids clear. Pupils round, reactive. EOMs intact.  EARS: External exam WNL, canals clear. Hearing grossly normal.  NOSE: No deformity or discharge.  MOUTH/THROAT: Lips w/o lesions.  RESPIRATORY: Breathing is even, unlabored. Lung sounds are clear   CARDIOVASCULAR: Heart RRR no murmurs, rubs or gallops. trace peripheral edema on L GASTROINTESTINAL: Abdomen is soft, non-tender, not distended w/ normal bowel sounds GENITOURINARY: Bladder non tender, not distended  MUSCULOSKELETAL: No abnormal joints or musculature NEUROLOGIC: Oriented X3. Cranial nerves 2-12 grossly intact. Moves all extremities no tremor. PSYCHIATRIC: Mood and affect appropriate to situation, no behavioral issues  Patient Active Problem List   Diagnosis Date Noted  . S/P total knee arthroplasty 11/13/2013  . Hypertension   . GERD (gastroesophageal reflux disease)   . OA (osteoarthritis) of knee 11/02/2013  . Postop Sinus tachycardia 06/27/2012  . Postop Hyponatremia 06/25/2012  . Postop Acute blood loss anemia 06/25/2012  . OA (osteoarthritis) of hip 06/24/2012    CBC    Component Value Date/Time   WBC 11.7* 11/05/2013 0420   RBC 3.72* 11/05/2013 0420   HGB 10.7* 11/05/2013 0420   HCT 33.6* 11/05/2013 0420   PLT 248 11/05/2013 0420   MCV 90.3 11/05/2013 0420   LYMPHSABS 2.6 10/23/2013 1305   MONOABS 0.5 10/23/2013 1305   EOSABS 0.3 10/23/2013 1305   BASOSABS 0.0 10/23/2013 1305    CMP     Component Value Date/Time   NA 135* 11/04/2013 0403   K 4.1 11/04/2013 0403   CL 99 11/04/2013 0403   CO2 24 11/04/2013  0403   GLUCOSE 121* 11/04/2013 0403   BUN 16 11/04/2013 0403   CREATININE 0.74 11/04/2013 0403   CALCIUM 9.3 11/04/2013 0403   PROT 7.4 10/23/2013 1305   ALBUMIN 4.3 10/23/2013 1305   AST 19 10/23/2013 1305   ALT 19 10/23/2013 1305   ALKPHOS 103 10/23/2013 1305   BILITOT 0.2* 10/23/2013 1305   GFRNONAA 84* 11/04/2013 0403   GFRAA >90 11/04/2013 0403    Assessment and Plan  OA (osteoarthritis) of knee Pain meds, robaxin and xarelto for prophylaxis  Postop Acute blood loss anemia Hb 11.8 to 10.7; continue ferrous sulfate daily  Hypertension Maxzide, lopressor, exforge-continue all  GERD (gastroesophageal reflux disease) Continue zantac and prn reglan    Hennie Duos, MD

## 2013-11-13 NOTE — Assessment & Plan Note (Signed)
Pain meds, robaxin and xarelto for prophylaxis

## 2013-11-16 ENCOUNTER — Encounter: Payer: Self-pay | Admitting: *Deleted

## 2013-11-19 ENCOUNTER — Non-Acute Institutional Stay (SKILLED_NURSING_FACILITY): Payer: Medicare Other | Admitting: Adult Health

## 2013-11-19 ENCOUNTER — Encounter: Payer: Self-pay | Admitting: Adult Health

## 2013-11-19 DIAGNOSIS — K5900 Constipation, unspecified: Secondary | ICD-10-CM | POA: Insufficient documentation

## 2013-11-19 DIAGNOSIS — G4700 Insomnia, unspecified: Secondary | ICD-10-CM

## 2013-11-19 DIAGNOSIS — M1710 Unilateral primary osteoarthritis, unspecified knee: Secondary | ICD-10-CM | POA: Diagnosis not present

## 2013-11-19 DIAGNOSIS — F419 Anxiety disorder, unspecified: Secondary | ICD-10-CM | POA: Insufficient documentation

## 2013-11-19 DIAGNOSIS — D62 Acute posthemorrhagic anemia: Secondary | ICD-10-CM | POA: Diagnosis not present

## 2013-11-19 DIAGNOSIS — M179 Osteoarthritis of knee, unspecified: Secondary | ICD-10-CM

## 2013-11-19 DIAGNOSIS — K219 Gastro-esophageal reflux disease without esophagitis: Secondary | ICD-10-CM

## 2013-11-19 DIAGNOSIS — IMO0002 Reserved for concepts with insufficient information to code with codable children: Secondary | ICD-10-CM

## 2013-11-19 DIAGNOSIS — I10 Essential (primary) hypertension: Secondary | ICD-10-CM

## 2013-11-19 DIAGNOSIS — Z96659 Presence of unspecified artificial knee joint: Secondary | ICD-10-CM

## 2013-11-19 DIAGNOSIS — F411 Generalized anxiety disorder: Secondary | ICD-10-CM

## 2013-11-19 NOTE — Progress Notes (Signed)
Patient ID: Debra Griffith, female   DOB: 12/16/1940, 73 y.o.   MRN: [id number]               PROGRESS NOTE  DATE: 11/09/2013  FACILITY: Nursing Home Location: [facility]  LEVEL OF CARE: SNF (31)  Acute Visit  CHIEF COMPLAINT:  Follow-up Hospitalization  HISTORY OF PRESENT ILLNESS: This is a 73 year old female who has been admitted to [facility] on 11/05/13 from [facility] with Osteoarthritis of bilateral knees S/P left total knee arthroplasty and right knee cortisone injection. She has been admitted for a short-term rehabilitation.  REASSESSMENT OF ONGOING PROBLEM(S):  HTN: Pt 's HTN remains stable.  Denies CP, sob, DOE, pedal edema, headaches, dizziness or visual disturbances.  No complications from the medications currently being used.  Last BP : 114/66  GERD: pt's GERD is stable.  Denies ongoing heartburn, abd. Pain, nausea or vomiting.  Currently on a PPI & tolerates it without any adverse reactions.  ANEMIA: The anemia has been stable. The patient denies fatigue, melena or hematochezia. No complications from the medications currently being used.  2/15  hgb 10.7  PAST MEDICAL HISTORY : Reviewed.  No changes.  CURRENT MEDICATIONS: Reviewed per [facility]  REVIEW OF SYSTEMS:  GENERAL: no change in appetite, no fatigue, no weight changes, no fever, chills or weakness RESPIRATORY: no cough, SOB, DOE, wheezing, hemoptysis CARDIAC: no chest pain, edema or palpitations GI: no abdominal pain, diarrhea, constipation, heart burn, nausea or vomiting  PHYSICAL EXAMINATION  GENERAL: no acute distress, normal body habitus EYES: conjunctivae normal, sclerae normal, normal eye lids NECK: supple, trachea midline, no neck masses, no thyroid tenderness, no thyromegaly LYMPHATICS: no LAN in the neck, no supraclavicular LAN RESPIRATORY: breathing is even & unlabored, BS CTAB CARDIAC: RRR, no murmur,no extra heart sounds, no edema GI: abdomen soft, normal BS, no  masses, no tenderness, no hepatomegaly, no splenomegaly PSYCHIATRIC: the patient is alert & oriented to person, affect & behavior appropriate  LABS/RADIOLOGY:  Labs reviewed: Basic Metabolic Panel:  Recent Labs  10/23/13 1305 11/03/13 0448 11/04/13 0403  NA 138 139 135*  K 3.4* 4.7 4.1  CL 101 103 99  CO2 23 24 24   GLUCOSE 116* 147* 121*  BUN 23 13 16   CREATININE 1.13* 0.77 0.74  CALCIUM 9.7 9.1 9.3   Liver Function Tests:  Recent Labs  10/23/13 1218 10/23/13 1305  AST 18 19  ALT 19 19  ALKPHOS 104 103  BILITOT 0.2* 0.2*  PROT 7.3 7.4  ALBUMIN 4.3 4.3   CBC:  Recent Labs  10/23/13 1218 10/23/13 1305 11/03/13 0448 11/04/13 0403 11/05/13 0420  WBC 7.0 6.7 12.6* 12.4* 11.7*  NEUTROABS  --  3.3  --   --   --   HGB 12.8 11.8* 11.6* 10.9* 10.7*  HCT 38.6 36.5 35.3* 33.1* 33.6*  MCV 89.6 88.8 89.1 88.7 90.3  PLT 304 293 262 255 248   Cardiac Enzymes:  Recent Labs  10/23/13 1305  TROPONINI <0.30     ASSESSMENT/PLAN:  Osteoarthritis Bilateral Knees S/P left total knee arthroplasty and right knee cortisone injection - for rehabilitation Hypertension - well-controlled; continue Maxzide, Norvasc and Candesartan and decrease Lopressor to 25 mg PO BID; BP/HR Q shift X 1 week; check BMP Anemia - stable; continue FeSO4; check CBC Constipation - no complaints GERD - stable; continue Zantac Insomnia - no complaints; continue Ambien Anxiety - stable; continue Klonopin   CPT CODE: 97353  Debra Griffith - NP  [place] [phone]

## 2013-11-19 NOTE — Progress Notes (Signed)
Patient ID: Debra Griffith, female   DOB: [birth year], 73 y.o.   MRN: [id number]               PROGRESS NOTE  DATE:  11/19/13  FACILITY: Nursing Home Location: [facility]  LEVEL OF CARE: SNF (31)  Acute Visit  CHIEF COMPLAINT:  Discharge Notes  HISTORY OF PRESENT ILLNESS: This is a 73 year old female who is for discharge home with outpatient care. She has been admitted to [facility] on 11/05/13 from [facility] with Osteoarthritis of bilateral knees S/P left total knee arthroplasty and right knee cortisone injection. Patient was admitted to this facility for short-term rehabilitation after the patient's recent hospitalization.  Patient has completed SNF rehabilitation and therapy has cleared the patient for discharge.   REASSESSMENT OF ONGOING PROBLEM(S):  HTN: Pt 's HTN remains stable.  Denies CP, sob, DOE, pedal edema, headaches, dizziness or visual disturbances.  No complications from the medications currently being used.  Last BP : 110/79  ANXIETY: The anxiety remains stable. Patient denies ongoing anxiety or irritability. No complications reported from the medications currently being used.  ANEMIA: The anemia has been stable. The patient denies fatigue, melena or hematochezia. No complications from the medications currently being used.  2/15  hgb 10.6  PAST MEDICAL HISTORY : Reviewed.  No changes.  CURRENT MEDICATIONS: Reviewed per [facility]  REVIEW OF SYSTEMS:  GENERAL: no change in appetite, no fatigue, no weight changes, no fever, chills or weakness RESPIRATORY: no cough, SOB, DOE, wheezing, hemoptysis CARDIAC: no chest pain, edema or palpitations GI: no abdominal pain, diarrhea, constipation, heart burn, nausea or vomiting  PHYSICAL EXAMINATION  GENERAL: no acute distress, normal body habitus NECK: supple, trachea midline, no neck masses, no thyroid tenderness, no thyromegaly LYMPHATICS: no LAN in the neck, no supraclavicular LAN RESPIRATORY:  breathing is even & unlabored, BS CTAB CARDIAC: RRR, no murmur,no extra heart sounds, no edema GI: abdomen soft, normal BS, no masses, no tenderness, no hepatomegaly, no splenomegaly PSYCHIATRIC: the patient is alert & oriented to person, affect & behavior appropriate  LABS/RADIOLOGY:  Labs reviewed: 11/10/13 WBC 9.6 hemoglobin 10.6 hematocrit 34.2 sodium 135 potassium 4.2 glucose 96 BUN 15 creatinine 0.9 calcium 9.1  Basic Metabolic Panel:  Recent Labs  10/23/13 1305 11/03/13 0448 11/04/13 0403  NA 138 139 135*  K 3.4* 4.7 4.1  CL 101 103 99  CO2 23 24 24   GLUCOSE 116* 147* 121*  BUN 23 13 16   CREATININE 1.13* 0.77 0.74  CALCIUM 9.7 9.1 9.3   Liver Function Tests:  Recent Labs  10/23/13 1218 10/23/13 1305  AST 18 19  ALT 19 19  ALKPHOS 104 103  BILITOT 0.2* 0.2*  PROT 7.3 7.4  ALBUMIN 4.3 4.3   CBC:  Recent Labs  10/23/13 1218 10/23/13 1305 11/03/13 0448 11/04/13 0403 11/05/13 0420  WBC 7.0 6.7 12.6* 12.4* 11.7*  NEUTROABS  --  3.3  --   --   --   HGB 12.8 11.8* 11.6* 10.9* 10.7*  HCT 38.6 36.5 35.3* 33.1* 33.6*  MCV 89.6 88.8 89.1 88.7 90.3  PLT 304 293 262 255 248   Cardiac Enzymes:  Recent Labs  10/23/13 1305  TROPONINI <0.30     ASSESSMENT/PLAN:  Osteoarthritis Bilateral Knees S/P left total knee arthroplasty and right knee cortisone injection - for outpatient care Hypertension - well-controlled; continue Maxzide, Norvasc and Candesartan and decrease Lopressor to 25 mg PO BID Anemia - stable; continue FeSO4 Constipation - no  complaints GERD - stable; continue Zantac Insomnia - no complaints; continue Ambien Anxiety - stable; continue Klonopin  I have filled out patient's discharge paperwork and written prescriptions.  Patient will have outpatient care. Total discharge time: Less than 30 minutes Discharge time involved coordination of the discharge process with Education officer, museum, nursing staff and therapy department. Medical justification for  home health services verified.   CPT CODE: 67341 Seth Bake - NP [facility] [phone]

## 2013-11-23 DIAGNOSIS — M25569 Pain in unspecified knee: Secondary | ICD-10-CM | POA: Diagnosis not present

## 2013-11-25 DIAGNOSIS — M25569 Pain in unspecified knee: Secondary | ICD-10-CM | POA: Diagnosis not present

## 2013-12-04 DIAGNOSIS — M25569 Pain in unspecified knee: Secondary | ICD-10-CM | POA: Diagnosis not present

## 2013-12-07 ENCOUNTER — Other Ambulatory Visit: Payer: Self-pay | Admitting: Adult Health

## 2013-12-08 DIAGNOSIS — M25569 Pain in unspecified knee: Secondary | ICD-10-CM | POA: Diagnosis not present

## 2013-12-09 DIAGNOSIS — M25569 Pain in unspecified knee: Secondary | ICD-10-CM | POA: Diagnosis not present

## 2013-12-15 DIAGNOSIS — M25569 Pain in unspecified knee: Secondary | ICD-10-CM | POA: Diagnosis not present

## 2013-12-17 DIAGNOSIS — M25569 Pain in unspecified knee: Secondary | ICD-10-CM | POA: Diagnosis not present

## 2013-12-22 DIAGNOSIS — M25569 Pain in unspecified knee: Secondary | ICD-10-CM | POA: Diagnosis not present

## 2013-12-24 DIAGNOSIS — M25569 Pain in unspecified knee: Secondary | ICD-10-CM | POA: Diagnosis not present

## 2013-12-29 DIAGNOSIS — M25569 Pain in unspecified knee: Secondary | ICD-10-CM | POA: Diagnosis not present

## 2013-12-31 DIAGNOSIS — M25569 Pain in unspecified knee: Secondary | ICD-10-CM | POA: Diagnosis not present

## 2014-01-04 DIAGNOSIS — M25569 Pain in unspecified knee: Secondary | ICD-10-CM | POA: Diagnosis not present

## 2014-01-06 DIAGNOSIS — M25569 Pain in unspecified knee: Secondary | ICD-10-CM | POA: Diagnosis not present

## 2014-01-12 DIAGNOSIS — Z79899 Other long term (current) drug therapy: Secondary | ICD-10-CM | POA: Diagnosis not present

## 2014-01-12 DIAGNOSIS — M1710 Unilateral primary osteoarthritis, unspecified knee: Secondary | ICD-10-CM | POA: Diagnosis not present

## 2014-01-19 DIAGNOSIS — M25569 Pain in unspecified knee: Secondary | ICD-10-CM | POA: Diagnosis not present

## 2014-01-21 DIAGNOSIS — F329 Major depressive disorder, single episode, unspecified: Secondary | ICD-10-CM | POA: Diagnosis not present

## 2014-01-21 DIAGNOSIS — G4700 Insomnia, unspecified: Secondary | ICD-10-CM | POA: Diagnosis not present

## 2014-01-21 DIAGNOSIS — I10 Essential (primary) hypertension: Secondary | ICD-10-CM | POA: Diagnosis not present

## 2014-01-21 DIAGNOSIS — M159 Polyosteoarthritis, unspecified: Secondary | ICD-10-CM | POA: Diagnosis not present

## 2014-01-21 DIAGNOSIS — E782 Mixed hyperlipidemia: Secondary | ICD-10-CM | POA: Diagnosis not present

## 2014-01-21 DIAGNOSIS — E7800 Pure hypercholesterolemia, unspecified: Secondary | ICD-10-CM | POA: Diagnosis not present

## 2014-01-21 DIAGNOSIS — E559 Vitamin D deficiency, unspecified: Secondary | ICD-10-CM | POA: Diagnosis not present

## 2014-01-21 DIAGNOSIS — M1990 Unspecified osteoarthritis, unspecified site: Secondary | ICD-10-CM | POA: Diagnosis not present

## 2014-01-25 DIAGNOSIS — M25569 Pain in unspecified knee: Secondary | ICD-10-CM | POA: Diagnosis not present

## 2014-01-26 DIAGNOSIS — Z471 Aftercare following joint replacement surgery: Secondary | ICD-10-CM | POA: Diagnosis not present

## 2014-01-26 DIAGNOSIS — M1710 Unilateral primary osteoarthritis, unspecified knee: Secondary | ICD-10-CM | POA: Diagnosis not present

## 2014-01-26 DIAGNOSIS — Z96659 Presence of unspecified artificial knee joint: Secondary | ICD-10-CM | POA: Diagnosis not present

## 2014-03-18 DIAGNOSIS — M159 Polyosteoarthritis, unspecified: Secondary | ICD-10-CM | POA: Diagnosis not present

## 2014-03-18 DIAGNOSIS — G4700 Insomnia, unspecified: Secondary | ICD-10-CM | POA: Diagnosis not present

## 2014-03-18 DIAGNOSIS — E7800 Pure hypercholesterolemia, unspecified: Secondary | ICD-10-CM | POA: Diagnosis not present

## 2014-03-18 DIAGNOSIS — F329 Major depressive disorder, single episode, unspecified: Secondary | ICD-10-CM | POA: Diagnosis not present

## 2014-03-18 DIAGNOSIS — E559 Vitamin D deficiency, unspecified: Secondary | ICD-10-CM | POA: Diagnosis not present

## 2014-03-18 DIAGNOSIS — Z23 Encounter for immunization: Secondary | ICD-10-CM | POA: Diagnosis not present

## 2014-03-18 DIAGNOSIS — I10 Essential (primary) hypertension: Secondary | ICD-10-CM | POA: Diagnosis not present

## 2014-03-18 DIAGNOSIS — E782 Mixed hyperlipidemia: Secondary | ICD-10-CM | POA: Diagnosis not present

## 2014-04-21 DIAGNOSIS — E559 Vitamin D deficiency, unspecified: Secondary | ICD-10-CM | POA: Diagnosis not present

## 2014-04-21 DIAGNOSIS — R5383 Other fatigue: Secondary | ICD-10-CM | POA: Diagnosis not present

## 2014-04-21 DIAGNOSIS — M25579 Pain in unspecified ankle and joints of unspecified foot: Secondary | ICD-10-CM | POA: Diagnosis not present

## 2014-04-21 DIAGNOSIS — M2550 Pain in unspecified joint: Secondary | ICD-10-CM | POA: Diagnosis not present

## 2014-04-21 DIAGNOSIS — M19049 Primary osteoarthritis, unspecified hand: Secondary | ICD-10-CM | POA: Diagnosis not present

## 2014-04-21 DIAGNOSIS — M5137 Other intervertebral disc degeneration, lumbosacral region: Secondary | ICD-10-CM | POA: Diagnosis not present

## 2014-04-21 DIAGNOSIS — M169 Osteoarthritis of hip, unspecified: Secondary | ICD-10-CM | POA: Diagnosis not present

## 2014-04-21 DIAGNOSIS — M19079 Primary osteoarthritis, unspecified ankle and foot: Secondary | ICD-10-CM | POA: Diagnosis not present

## 2014-04-21 DIAGNOSIS — M1710 Unilateral primary osteoarthritis, unspecified knee: Secondary | ICD-10-CM | POA: Diagnosis not present

## 2014-04-21 DIAGNOSIS — R5381 Other malaise: Secondary | ICD-10-CM | POA: Diagnosis not present

## 2014-04-21 DIAGNOSIS — Z79899 Other long term (current) drug therapy: Secondary | ICD-10-CM | POA: Diagnosis not present

## 2014-04-21 DIAGNOSIS — M1610 Unilateral primary osteoarthritis, unspecified hip: Secondary | ICD-10-CM | POA: Diagnosis not present

## 2014-04-22 ENCOUNTER — Other Ambulatory Visit: Payer: Self-pay | Admitting: Orthopedic Surgery

## 2014-04-22 NOTE — Progress Notes (Signed)
Preoperative surgical orders have been place into the Epic hospital system for Debra Griffith on 04/22/2014, 10:26 AM  by Mickel Crow for surgery on 05/31/2014.  Preop Total Knee orders including Experal, IV Tylenol, and IV Decadron as long as there are no contraindications to the above medications. Arlee Muslim, PA-C

## 2014-05-11 DIAGNOSIS — Z79899 Other long term (current) drug therapy: Secondary | ICD-10-CM | POA: Diagnosis not present

## 2014-05-11 DIAGNOSIS — M47817 Spondylosis without myelopathy or radiculopathy, lumbosacral region: Secondary | ICD-10-CM | POA: Diagnosis not present

## 2014-05-18 ENCOUNTER — Encounter (HOSPITAL_COMMUNITY): Payer: Self-pay | Admitting: Pharmacy Technician

## 2014-05-25 DIAGNOSIS — M19079 Primary osteoarthritis, unspecified ankle and foot: Secondary | ICD-10-CM | POA: Diagnosis not present

## 2014-05-25 DIAGNOSIS — M19049 Primary osteoarthritis, unspecified hand: Secondary | ICD-10-CM | POA: Diagnosis not present

## 2014-05-26 ENCOUNTER — Other Ambulatory Visit: Payer: Self-pay | Admitting: Surgical

## 2014-05-26 ENCOUNTER — Encounter (HOSPITAL_COMMUNITY)
Admission: RE | Admit: 2014-05-26 | Discharge: 2014-05-26 | Disposition: A | Discharge status: Home / Self Care | Payer: Medicare Other | Source: Ambulatory Visit | Attending: Orthopedic Surgery | Admitting: Orthopedic Surgery

## 2014-05-26 ENCOUNTER — Encounter (HOSPITAL_COMMUNITY): Payer: Self-pay

## 2014-05-26 ENCOUNTER — Encounter (INDEPENDENT_AMBULATORY_CARE_PROVIDER_SITE_OTHER): Payer: Self-pay

## 2014-05-26 DIAGNOSIS — Z7901 Long term (current) use of anticoagulants: Secondary | ICD-10-CM | POA: Diagnosis not present

## 2014-05-26 DIAGNOSIS — Z01812 Encounter for preprocedural laboratory examination: Secondary | ICD-10-CM | POA: Diagnosis not present

## 2014-05-26 DIAGNOSIS — M1710 Unilateral primary osteoarthritis, unspecified knee: Secondary | ICD-10-CM | POA: Insufficient documentation

## 2014-05-26 HISTORY — DX: Unspecified right bundle-branch block: I45.10

## 2014-05-26 LAB — COMPREHENSIVE METABOLIC PANEL
ALK PHOS: 88 U/L (ref 39–117)
ALT: 15 U/L (ref 0–35)
AST: 16 U/L (ref 0–37)
Albumin: 3.8 g/dL (ref 3.5–5.2)
Anion gap: 12 (ref 5–15)
BILIRUBIN TOTAL: 0.3 mg/dL (ref 0.3–1.2)
BUN: 27 mg/dL — ABNORMAL HIGH (ref 6–23)
CHLORIDE: 97 meq/L (ref 96–112)
CO2: 26 meq/L (ref 19–32)
Calcium: 9.7 mg/dL (ref 8.4–10.5)
Creatinine, Ser: 1.01 mg/dL (ref 0.50–1.10)
GFR, EST AFRICAN AMERICAN: 63 mL/min — AB (ref >90)
GFR, EST NON AFRICAN AMERICAN: 54 mL/min — AB (ref >90)
GLUCOSE: 100 mg/dL — AB (ref 70–99)
Potassium: 3.8 mEq/L (ref 3.7–5.3)
SODIUM: 135 meq/L — AB (ref 137–147)
Total Protein: 7 g/dL (ref 6.0–8.3)

## 2014-05-26 LAB — APTT: aPTT: 26 seconds (ref 24–37)

## 2014-05-26 LAB — URINALYSIS, ROUTINE W REFLEX MICROSCOPIC
BILIRUBIN URINE: NEGATIVE
GLUCOSE, UA: NEGATIVE mg/dL
Hgb urine dipstick: NEGATIVE
KETONES UR: NEGATIVE mg/dL
Leukocytes, UA: NEGATIVE
NITRITE: POSITIVE — AB
Protein, ur: NEGATIVE mg/dL
Specific Gravity, Urine: 1.014 (ref 1.005–1.030)
Urobilinogen, UA: 0.2 mg/dL (ref 0.0–1.0)
pH: 6.5 (ref 5.0–8.0)

## 2014-05-26 LAB — CBC
HCT: 36.1 % (ref 36.0–46.0)
HEMOGLOBIN: 11.9 g/dL — AB (ref 12.0–15.0)
MCH: 29.3 pg (ref 26.0–34.0)
MCHC: 33 g/dL (ref 30.0–36.0)
MCV: 88.9 fL (ref 78.0–100.0)
Platelets: 289 10*3/uL (ref 150–400)
RBC: 4.06 MIL/uL (ref 3.87–5.11)
RDW: 13.6 % (ref 11.5–15.5)
WBC: 5.2 10*3/uL (ref 4.0–10.5)

## 2014-05-26 LAB — URINE MICROSCOPIC-ADD ON

## 2014-05-26 LAB — PROTIME-INR
INR: 1.03 (ref 0.00–1.49)
Prothrombin Time: 13.5 seconds (ref 11.6–15.2)

## 2014-05-26 LAB — SURGICAL PCR SCREEN
MRSA, PCR: NEGATIVE
STAPHYLOCOCCUS AUREUS: POSITIVE — AB

## 2014-05-26 NOTE — Pre-Procedure Instructions (Signed)
EKG AND CXR REPORTS ARE IN EPIC FROM 10-23-13.

## 2014-05-26 NOTE — H&P (Signed)
TOTAL KNEE ADMISSION H&P  Patient is being admitted for right total knee arthroplasty.  Subjective:  Chief Complaint:right knee pain.  HPI: Debra Griffith, 73 y.o. female, has a history of pain and functional disability in the right knee due to arthritis and has failed non-surgical conservative treatments for greater than 12 weeks to includeNSAID's and/or analgesics, corticosteriod injections, use of assistive devices, weight reduction as appropriate and activity modification.  Onset of symptoms was gradual, starting 10 years ago with gradually worsening course since that time. The patient noted prior procedures on the knee to include  arthroscopy and menisectomy on the right knee(s).  Patient currently rates pain in the right knee(s) at 6 out of 10 with activity. Patient has night pain, worsening of pain with activity and weight bearing, pain that interferes with activities of daily living, pain with passive range of motion, crepitus and joint swelling.  Patient has evidence of periarticular osteophytes and joint space narrowing by imaging studies.  There is no active infection.  Patient Active Problem List   Diagnosis Date Noted  . Constipation 11/19/2013  . Insomnia 11/19/2013  . Anxiety 11/19/2013  . S/P total knee arthroplasty 11/13/2013  . Hypertension   . GERD (gastroesophageal reflux disease)   . OA (osteoarthritis) of knee 11/02/2013  . Postop Sinus tachycardia 06/27/2012  . Postop Hyponatremia 06/25/2012  . Postop Acute blood loss anemia 06/25/2012  . OA (osteoarthritis) of hip 06/24/2012   Past Medical History  Diagnosis Date  . Complication of anesthesia     severe diarrhea and vomiting after hysterectomy   . PONV (postoperative nausea and vomiting)   . Dysrhythmia     [facility] - > 1 year ago   . Anxiety   . Arthritis   . Anemia     hx of   . DDD (degenerative disc disease), lumbar   . Obesity   . Insomnia   . Depression   . H/O bronchitis   . Vitamin D deficiency    . Hypertension   . GERD (gastroesophageal reflux disease)     Past Surgical History  Procedure Laterality Date  . Cesarean section      x 3  . Wisdom tooth extraction  1970's    admitted to hospital for surgery  . Abdominal hysterectomy    . Total hip arthroplasty  06/24/2012    Procedure: TOTAL HIP ARTHROPLASTY;  Surgeon: [name], MD;  Location: [facility];  Service: Orthopedics;  Laterality: Left;  . Total knee arthroplasty N/A 11/02/2013    Procedure: LEFT TOTAL KNEE ARTHROPLASTY WITH RIGHT KNEE CORTISONE INJECTION;  Surgeon: [name], MD;  Location: [facility];  Service: Orthopedics;  Laterality: N/A;     Current outpatient prescriptions: albuterol (PROVENTIL HFA;VENTOLIN HFA) 108 (90 BASE) MCG/ACT inhaler, Inhale 2 puffs into the lungs every 6 (six) hours as needed. Wheezing and shortness of breath, Disp: , Rfl: ;  amLODipine (NORVASC) 10 MG tablet, Take 10 mg by mouth every morning., Disp: , Rfl: ;   aspirin EC 81 MG tablet, Take 81 mg by mouth every evening., Disp: , Rfl:  buPROPion (WELLBUTRIN SR) 150 MG 12 hr tablet, Take 150 mg by mouth every morning., Disp: , Rfl: ;   Cholecalciferol (VITAMIN D-3) 5000 UNITS TABS, Take 1 tablet by mouth 2 (two) times daily., Disp: , Rfl: ;   clonazePAM (KLONOPIN) 1 MG tablet, Take 1 mg by mouth at bedtime., Disp: , Rfl: ;   cyclobenzaprine (FLEXERIL) 10 MG tablet, Take 20  mg by mouth at bedtime., Disp: , Rfl:  ferrous sulfate 325 (65 FE) MG tablet, Take 325 mg by mouth daily with breakfast. For anemia, Disp: , Rfl: ;   HYDROcodone-acetaminophen (NORCO) 10-325 MG per tablet, Take 1 tablet by mouth 4 (four) times daily as needed for moderate pain., Disp: , Rfl: ;   metoprolol tartrate (LOPRESSOR) 25 MG tablet, Take 25 mg by mouth 2 (two) times daily. , Disp: , Rfl:  naproxen sodium (ANAPROX) 220 MG tablet, Take 220 mg by mouth 2 (two) times daily with a meal., Disp: , Rfl: ;   potassium chloride SA (K-DUR,KLOR-CON) 20 MEQ tablet, Take 20  mEq by mouth 2 (two) times daily. For potassium supplement., Disp: , Rfl: ;   ranitidine (ZANTAC) 150 MG tablet, Take 300 mg by mouth 2 (two) times daily., Disp: , Rfl:  triamterene-hydrochlorothiazide (MAXZIDE-25) 37.5-25 MG per tablet, Take 1 tablet by mouth daily before breakfast. , Disp: , Rfl: ;   valsartan (DIOVAN) 320 MG tablet, Take 320 mg by mouth every morning., Disp: , Rfl: ;   zolpidem (AMBIEN) 10 MG tablet, Take 10 mg by mouth at bedtime. , Disp: , Rfl:   Allergies  Allergen Reactions  . Morphine And Related     hallucinations    History  Substance Use Topics  . Smoking status: Never Smoker   . Smokeless tobacco: Never Used  . Alcohol Use: No    Family History Cancer. father Cerebrovascular Accident. father Chronic Obstructive Lung Disease. father Congestive Heart Failure. First Degree Relatives. father Heart Disease. First Degree Relatives. father Hypertension. mother, father and sister Osteoarthritis. First Degree Relatives. mother and sister Osteoporosis. mother  Review of Systems  Constitutional: Negative.   HENT: Negative.   Eyes: Negative.   Respiratory: Negative.   Cardiovascular: Negative.   Gastrointestinal: Negative.   Genitourinary: Negative.   Musculoskeletal: Positive for back pain and joint pain. Negative for falls, myalgias and neck pain.       Right knee pain  Skin: Negative.   Neurological: Negative.   Endo/Heme/Allergies: Negative.   Psychiatric/Behavioral: Negative.     Objective:  Physical Exam  Constitutional: She is oriented to person, place, and time. She appears well-developed.  Morbidly obese  HENT:  Head: Normocephalic and atraumatic.  Right Ear: External ear normal.  Left Ear: External ear normal.  Nose: Nose normal.  Mouth/Throat: Oropharynx is clear and moist.  Eyes: Conjunctivae and EOM are normal.  Neck: Normal range of motion. Neck supple.  Cardiovascular: Normal rate, normal heart sounds and intact distal  pulses.  An irregularly irregular rhythm present.  No murmur heard. Respiratory: Effort normal. No respiratory distress. She has decreased breath sounds. She has no wheezes.  GI: Soft. Bowel sounds are normal. She exhibits no distension. There is no tenderness.  Musculoskeletal:       Right knee: She exhibits decreased range of motion and swelling. She exhibits no effusion and no erythema. Tenderness found. Medial joint line and lateral joint line tenderness noted.       Left knee: Normal.       Right lower leg: She exhibits edema. She exhibits no tenderness.       Left lower leg: She exhibits edema. She exhibits no tenderness.  Left knee range of motion is 0-125 degrees with no swelling, tenderness, or instability. Right knee with varus deformity. Marked crepitus on range of motion. Range of motion 5-120 degrees with no instability.   Neurological: She is alert and oriented to person, place,  and time. She has normal strength and normal reflexes. No sensory deficit.  Skin: No rash noted. No erythema.  Psychiatric: She has a normal mood and affect. Her behavior is normal.    Vitals  Weight: 240 lb Height: 61in Body Surface Area: 2.16 m Body Mass Index: 45.35 kg/m BP: 122/76 (Sitting, Left Arm, Standard) HR: 76 bpm  Imaging Review Plain radiographs demonstrate severe degenerative joint disease of the right knee(s). The overall alignment ismild varus. The bone quality appears to be good for age and reported activity level.  Assessment/Plan:  End stage arthritis, right knee   The patient history, physical examination, clinical judgment of the provider and imaging studies are consistent with end stage degenerative joint disease of the right knee(s) and total knee arthroplasty is deemed medically necessary. The treatment options including medical management, injection therapy arthroscopy and arthroplasty were discussed at length. The risks and benefits of total knee arthroplasty were  presented and reviewed. The risks due to aseptic loosening, infection, stiffness, patella tracking problems, thromboembolic complications and other imponderables were discussed. The patient acknowledged the explanation, agreed to proceed with the plan and consent was signed. Patient is being admitted for inpatient treatment for surgery, pain control, PT, OT, prophylactic antibiotics, VTE prophylaxis, progressive ambulation and ADL's and discharge planning. The patient is planning to be discharged to skilled nursing facility ([facility])   PCP: Dr. Inda Merlin  TXA  Ardeen Jourdain, PA-C

## 2014-05-26 NOTE — Progress Notes (Signed)
05/26/14 1030  OBSTRUCTIVE SLEEP APNEA  Have you ever been diagnosed with sleep apnea through a sleep study? No  Do you snore loudly (loud enough to be heard through closed doors)?  0  Do you often feel tired, fatigued, or sleepy during the daytime? 0  Has anyone observed you stop breathing during your sleep? 0  Do you have, or are you being treated for high blood pressure? 1  BMI more than 35 kg/m2? 1  Age over 72 years old? 1  Neck circumference greater than 40 cm/16 inches? 1  Gender: 0  Obstructive Sleep Apnea Score 4  Score 4 or greater  Results sent to PCP

## 2014-05-26 NOTE — Pre-Procedure Instructions (Signed)
PT'S PREOP LABS FAXED TO DR. ALUISIO'S OFFICE WITH NOTE THAT PT'S URINALYSIS ABNORMAL, PCR POSITIVE STAPH AUREUS, BUN 27.

## 2014-05-26 NOTE — Patient Instructions (Signed)
YOUR SURGERY IS SCHEDULED AT [facility]  ON:  Monday  9/14  REPORT TO  SHORT STAY CENTER AT:  6:00 AM   PLEASE COME IN THE [place] ENTRANCE AND FOLLOW SIGNS TO SHORT STAY CENTER.  DO NOT EAT OR DRINK ANYTHING AFTER MIDNIGHT THE NIGHT BEFORE YOUR SURGERY.  YOU MAY BRUSH YOUR TEETH, RINSE OUT YOUR MOUTH--BUT NO WATER, NO FOOD, NO CHEWING GUM, NO MINTS, NO CANDIES, NO CHEWING TOBACCO.  PLEASE TAKE THE FOLLOWING MEDICATIONS THE AM OF YOUR SURGERY WITH A FEW SIPS OF WATER:  AMLODIPINE, BUPROPION, METOPROLOL, RANITIDINE.  USE YOUR ALBUTEROL INHALER.  MAY TAKE HYDROCODONE / ACETAMINOPHEN FOR PAIN IF NEEDED.  DO NOT BRING VALUABLES, MONEY, CREDIT CARDS.  DO NOT WEAR JEWELRY, MAKE-UP, NAIL POLISH AND NO METAL PINS OR CLIPS IN YOUR HAIR. CONTACT LENS, DENTURES / PARTIALS, GLASSES SHOULD NOT BE WORN TO SURGERY AND IN MOST CASES-HEARING AIDS WILL NEED TO BE REMOVED.  BRING YOUR GLASSES CASE, ANY EQUIPMENT NEEDED FOR YOUR CONTACT LENS. FOR PATIENTS ADMITTED TO THE HOSPITAL--CHECK OUT TIME THE DAY OF DISCHARGE IS 11:00 AM.  ALL INPATIENT ROOMS ARE PRIVATE - WITH BATHROOM, TELEPHONE, TELEVISION AND WIFI INTERNET.                                                     PLEASE READ OVER ANY  FACT SHEETS THAT YOU WERE GIVEN: MRSA INFORMATION, BLOOD TRANSFUSION INFORMATION, INCENTIVE SPIROMETER INFORMATION.  PLEASE BE AWARE THAT YOU MAY NEED ADDITIONAL BLOOD DRAWN DAY OF YOUR SURGERY  _______________________________________________________________________   [facility] - Preparing for Surgery Before surgery, you can play an important role.  Because skin is not sterile, your skin needs to be as free of germs as possible.  You can reduce the number of germs on your skin by washing with CHG (chlorahexidine gluconate) soap before surgery.  CHG is an antiseptic cleaner which kills germs and bonds with the skin to continue killing germs even after washing. Please DO NOT use if you have  an allergy to CHG or antibacterial soaps.  If your skin becomes reddened/irritated stop using the CHG and inform your nurse when you arrive at Short Stay. Do not shave (including legs and underarms) for at least 48 hours prior to the first CHG shower.  You may shave your face/neck. Please follow these instructions carefully:  1.  Shower with CHG Soap the night before surgery and the  morning of Surgery.  2.  If you choose to wash your hair, wash your hair first as usual with your  normal  shampoo.  3.  After you shampoo, rinse your hair and body thoroughly to remove the  shampoo.                           4.  Use CHG as you would any other liquid soap.  You can apply chg directly  to the skin and wash                       Gently with a scrungie or clean washcloth.  5.  Apply the CHG Soap to your body ONLY FROM THE NECK DOWN.   Do not use on face/ open  Wound or open sores. Avoid contact with eyes, ears mouth and genitals (private parts).                       Wash face,  Genitals (private parts) with your normal soap.             6.  Wash thoroughly, paying special attention to the area where your surgery  will be performed.  7.  Thoroughly rinse your body with warm water from the neck down.  8.  DO NOT shower/wash with your normal soap after using and rinsing off  the CHG Soap.                9.  Pat yourself dry with a clean towel.            10.  Wear clean pajamas.            11.  Place clean sheets on your bed the night of your first shower and do not  sleep with pets. Day of Surgery : Do not apply any lotions/deodorants the morning of surgery.  Please wear clean clothes to the hospital/surgery center.  FAILURE TO FOLLOW THESE INSTRUCTIONS MAY RESULT IN THE CANCELLATION OF YOUR SURGERY PATIENT SIGNATURE_________________________________  NURSE  SIGNATURE__________________________________  ________________________________________________________________________   Adam Phenix  An incentive spirometer is a tool that can help keep your lungs clear and active. This tool measures how well you are filling your lungs with each breath. Taking long deep breaths may help reverse or decrease the chance of developing breathing (pulmonary) problems (especially infection) following:  A long period of time when you are unable to move or be active. BEFORE THE PROCEDURE   If the spirometer includes an indicator to show your best effort, your nurse or respiratory therapist will set it to a desired goal.  If possible, sit up straight or lean slightly forward. Try not to slouch.  Hold the incentive spirometer in an upright position. INSTRUCTIONS FOR USE  1. Sit on the edge of your bed if possible, or sit up as far as you can in bed or on a chair. 2. Hold the incentive spirometer in an upright position. 3. Breathe out normally. 4. Place the mouthpiece in your mouth and seal your lips tightly around it. 5. Breathe in slowly and as deeply as possible, raising the piston or the ball toward the top of the column. 6. Hold your breath for 3-5 seconds or for as long as possible. Allow the piston or ball to fall to the bottom of the column. 7. Remove the mouthpiece from your mouth and breathe out normally. 8. Rest for a few seconds and repeat Steps 1 through 7 at least 10 times every 1-2 hours when you are awake. Take your time and take a few normal breaths between deep breaths. 9. The spirometer may include an indicator to show your best effort. Use the indicator as a goal to work toward during each repetition. 10. After each set of 10 deep breaths, practice coughing to be sure your lungs are clear. If you have an incision (the cut made at the time of surgery), support your incision when coughing by placing a pillow or rolled up towels firmly  against it. Once you are able to get out of bed, walk around indoors and cough well. You may stop using the incentive spirometer when instructed by your caregiver.  RISKS AND COMPLICATIONS  Take your time so you do not  get dizzy or light-headed.  If you are in pain, you may need to take or ask for pain medication before doing incentive spirometry. It is harder to take a deep breath if you are having pain. AFTER USE  Rest and breathe slowly and easily.  It can be helpful to keep track of a log of your progress. Your caregiver can provide you with a simple table to help with this. If you are using the spirometer at home, follow these instructions: [place] IF:   You are having difficultly using the spirometer.  You have trouble using the spirometer as often as instructed.  Your pain medication is not giving enough relief while using the spirometer.  You develop fever of 100.5 F (38.1 C) or higher. SEEK IMMEDIATE MEDICAL CARE IF:   You cough up bloody sputum that had not been present before.  You develop fever of 102 F (38.9 C) or greater.  You develop worsening pain at or near the incision site. MAKE SURE YOU:   Understand these instructions.  Will watch your condition.  Will get help right away if you are not doing well or get worse. Document Released: 01/14/2007 Document Revised: 11/26/2011 Document Reviewed: 03/17/2007 Exit[employer] Patient Information 2014 [employer], [place].   ________________________________________________________________________  WHAT IS A BLOOD TRANSFUSION? Blood Transfusion Information  A transfusion is the replacement of blood or some of its parts. Blood is made up of multiple cells which provide different functions.  Red blood cells carry oxygen and are used for blood loss replacement.  White blood cells fight against infection.  Platelets control bleeding.  Plasma helps clot blood.  Other blood products are available for  specialized needs, such as hemophilia or other clotting disorders. BEFORE THE TRANSFUSION  Who gives blood for transfusions?   Healthy volunteers who are fully evaluated to make sure their blood is safe. This is blood bank blood. Transfusion therapy is the safest it has ever been in the practice of medicine. Before blood is taken from a donor, a complete history is taken to make sure that person has no history of diseases nor engages in risky social behavior (examples are intravenous drug use or sexual activity with multiple partners). The donor's travel history is screened to minimize risk of transmitting infections, such as malaria. The donated blood is tested for signs of infectious diseases, such as HIV and hepatitis. The blood is then tested to be sure it is compatible with you in order to minimize the chance of a transfusion reaction. If you or a relative donates blood, this is often done in anticipation of surgery and is not appropriate for emergency situations. It takes many days to process the donated blood. RISKS AND COMPLICATIONS Although transfusion therapy is very safe and saves many lives, the main dangers of transfusion include:   Getting an infectious disease.  Developing a transfusion reaction. This is an allergic reaction to something in the blood you were given. Every precaution is taken to prevent this. The decision to have a blood transfusion has been considered carefully by your caregiver before blood is given. Blood is not given unless the benefits outweigh the risks. AFTER THE TRANSFUSION  Right after receiving a blood transfusion, you will usually feel much better and more energetic. This is especially true if your red blood cells have gotten low (anemic). The transfusion raises the level of the red blood cells which carry oxygen, and this usually causes an energy increase.  The nurse administering the transfusion will  monitor you carefully for complications. HOME CARE  INSTRUCTIONS  No special instructions are needed after a transfusion. You may find your energy is better. Speak with your caregiver about any limitations on activity for underlying diseases you may have. SEEK MEDICAL CARE IF:   Your condition is not improving after your transfusion.  You develop redness or irritation at the intravenous (IV) site. SEEK IMMEDIATE MEDICAL CARE IF:  Any of the following symptoms occur over the next 12 hours:  Shaking chills.  You have a temperature by mouth above 102 F (38.9 C), not controlled by medicine.  Chest, back, or muscle pain.  People around you feel you are not acting correctly or are confused.  Shortness of breath or difficulty breathing.  Dizziness and fainting.  You get a rash or develop hives.  You have a decrease in urine output.  Your urine turns a dark color or changes to pink, red, or brown. Any of the following symptoms occur over the next 10 days:  You have a temperature by mouth above 102 F (38.9 C), not controlled by medicine.  Shortness of breath.  Weakness after normal activity.  The white part of the eye turns yellow (jaundice).  You have a decrease in the amount of urine or are urinating less often.  Your urine turns a dark color or changes to pink, red, or brown. Document Released: 08/31/2000 Document Revised: 11/26/2011 Document Reviewed: 04/19/2008 [facility] Patient Information 2014 [place], [place].  _______________________________________________________________________

## 2014-05-28 NOTE — Pre-Procedure Instructions (Signed)
FAXED NOTE RECEIVED FROM DR. Wynelle Link THAT PT WAS CALLED TO START CIPRO FOR UTI.

## 2014-05-30 NOTE — Anesthesia Preprocedure Evaluation (Addendum)
Anesthesia Evaluation  Patient identified by MRN, date of birth, ID band Patient awake    Reviewed: Allergy & Precautions, H&P , NPO status , Patient's Chart, lab work & pertinent test results, reviewed documented beta blocker date and time   History of Anesthesia Complications (+) PONV and history of anesthetic complications  Airway Mallampati: II TM Distance: >3 FB Neck ROM: Full    Dental no notable dental hx.    Pulmonary neg pulmonary ROS,  breath sounds clear to auscultation  Pulmonary exam normal       Cardiovascular hypertension, Pt. on medications and Pt. on home beta blockers + dysrhythmias (RBBB) Rhythm:Irregular Rate:Normal - Systolic murmurs    Neuro/Psych PSYCHIATRIC DISORDERS Anxiety Depression negative neurological ROS     GI/Hepatic Neg liver ROS, GERD-  Medicated and Controlled,  Endo/Other  negative endocrine ROSMorbid obesity  Renal/GU negative Renal ROS  negative genitourinary   Musculoskeletal  (+) Arthritis -,   Abdominal   Peds negative pediatric ROS (+)  Hematology  (+) anemia ,   Anesthesia Other Findings   Reproductive/Obstetrics negative OB ROS                          Anesthesia Physical Anesthesia Plan  ASA: III  Anesthesia Plan: Spinal   Post-op Pain Management:    Induction: Intravenous  Airway Management Planned: Nasal Cannula  Additional Equipment:   Intra-op Plan:   Post-operative Plan:   Informed Consent: I have reviewed the patients History and Physical, chart, labs and discussed the procedure including the risks, benefits and alternatives for the proposed anesthesia with the patient or authorized representative who has indicated his/her understanding and acceptance.   Dental advisory given  Plan Discussed with: CRNA  Anesthesia Plan Comments:         Anesthesia Quick Evaluation

## 2014-05-31 ENCOUNTER — Inpatient Hospital Stay (HOSPITAL_COMMUNITY)
Admission: RE | Admit: 2014-05-31 | Discharge: 2014-06-02 | DRG: 470 | Disposition: A | Discharge status: Skilled Nursing Facility | Payer: Medicare Other | Source: Ambulatory Visit | Attending: Orthopedic Surgery | Admitting: Orthopedic Surgery

## 2014-05-31 ENCOUNTER — Inpatient Hospital Stay (HOSPITAL_COMMUNITY): Payer: Medicare Other | Admitting: Anesthesiology

## 2014-05-31 ENCOUNTER — Encounter (HOSPITAL_COMMUNITY): Payer: Medicare Other | Admitting: Anesthesiology

## 2014-05-31 ENCOUNTER — Encounter (HOSPITAL_COMMUNITY)
Admission: RE | Disposition: A | Discharge status: Skilled Nursing Facility | Payer: Self-pay | Source: Ambulatory Visit | Attending: Orthopedic Surgery

## 2014-05-31 ENCOUNTER — Encounter (HOSPITAL_COMMUNITY): Payer: Self-pay | Admitting: *Deleted

## 2014-05-31 DIAGNOSIS — Z836 Family history of other diseases of the respiratory system: Secondary | ICD-10-CM

## 2014-05-31 DIAGNOSIS — R262 Difficulty in walking, not elsewhere classified: Secondary | ICD-10-CM | POA: Diagnosis not present

## 2014-05-31 DIAGNOSIS — Z8262 Family history of osteoporosis: Secondary | ICD-10-CM | POA: Diagnosis not present

## 2014-05-31 DIAGNOSIS — Z01812 Encounter for preprocedural laboratory examination: Secondary | ICD-10-CM

## 2014-05-31 DIAGNOSIS — Z96649 Presence of unspecified artificial hip joint: Secondary | ICD-10-CM | POA: Diagnosis not present

## 2014-05-31 DIAGNOSIS — R279 Unspecified lack of coordination: Secondary | ICD-10-CM | POA: Diagnosis not present

## 2014-05-31 DIAGNOSIS — M169 Osteoarthritis of hip, unspecified: Secondary | ICD-10-CM | POA: Diagnosis present

## 2014-05-31 DIAGNOSIS — M5137 Other intervertebral disc degeneration, lumbosacral region: Secondary | ICD-10-CM | POA: Diagnosis present

## 2014-05-31 DIAGNOSIS — M1610 Unilateral primary osteoarthritis, unspecified hip: Secondary | ICD-10-CM | POA: Diagnosis present

## 2014-05-31 DIAGNOSIS — G4700 Insomnia, unspecified: Secondary | ICD-10-CM | POA: Diagnosis present

## 2014-05-31 DIAGNOSIS — IMO0002 Reserved for concepts with insufficient information to code with codable children: Secondary | ICD-10-CM | POA: Diagnosis present

## 2014-05-31 DIAGNOSIS — F3289 Other specified depressive episodes: Secondary | ICD-10-CM | POA: Diagnosis present

## 2014-05-31 DIAGNOSIS — Z809 Family history of malignant neoplasm, unspecified: Secondary | ICD-10-CM

## 2014-05-31 DIAGNOSIS — Z885 Allergy status to narcotic agent status: Secondary | ICD-10-CM | POA: Diagnosis not present

## 2014-05-31 DIAGNOSIS — I4510 Unspecified right bundle-branch block: Secondary | ICD-10-CM | POA: Diagnosis present

## 2014-05-31 DIAGNOSIS — Z6841 Body Mass Index (BMI) 40.0 and over, adult: Secondary | ICD-10-CM | POA: Diagnosis not present

## 2014-05-31 DIAGNOSIS — F329 Major depressive disorder, single episode, unspecified: Secondary | ICD-10-CM | POA: Diagnosis not present

## 2014-05-31 DIAGNOSIS — M1710 Unilateral primary osteoarthritis, unspecified knee: Principal | ICD-10-CM | POA: Diagnosis present

## 2014-05-31 DIAGNOSIS — S99919A Unspecified injury of unspecified ankle, initial encounter: Secondary | ICD-10-CM | POA: Diagnosis not present

## 2014-05-31 DIAGNOSIS — D649 Anemia, unspecified: Secondary | ICD-10-CM | POA: Diagnosis not present

## 2014-05-31 DIAGNOSIS — Z471 Aftercare following joint replacement surgery: Secondary | ICD-10-CM | POA: Diagnosis not present

## 2014-05-31 DIAGNOSIS — M51379 Other intervertebral disc degeneration, lumbosacral region without mention of lumbar back pain or lower extremity pain: Secondary | ICD-10-CM | POA: Diagnosis present

## 2014-05-31 DIAGNOSIS — Z7982 Long term (current) use of aspirin: Secondary | ICD-10-CM | POA: Diagnosis not present

## 2014-05-31 DIAGNOSIS — M179 Osteoarthritis of knee, unspecified: Secondary | ICD-10-CM | POA: Diagnosis present

## 2014-05-31 DIAGNOSIS — M25569 Pain in unspecified knee: Secondary | ICD-10-CM | POA: Diagnosis present

## 2014-05-31 DIAGNOSIS — S8990XA Unspecified injury of unspecified lower leg, initial encounter: Secondary | ICD-10-CM | POA: Diagnosis not present

## 2014-05-31 DIAGNOSIS — M25469 Effusion, unspecified knee: Secondary | ICD-10-CM | POA: Diagnosis present

## 2014-05-31 DIAGNOSIS — K219 Gastro-esophageal reflux disease without esophagitis: Secondary | ICD-10-CM | POA: Diagnosis present

## 2014-05-31 DIAGNOSIS — Z8249 Family history of ischemic heart disease and other diseases of the circulatory system: Secondary | ICD-10-CM | POA: Diagnosis not present

## 2014-05-31 DIAGNOSIS — Z823 Family history of stroke: Secondary | ICD-10-CM

## 2014-05-31 DIAGNOSIS — M6281 Muscle weakness (generalized): Secondary | ICD-10-CM | POA: Diagnosis not present

## 2014-05-31 DIAGNOSIS — I10 Essential (primary) hypertension: Secondary | ICD-10-CM | POA: Diagnosis present

## 2014-05-31 DIAGNOSIS — E559 Vitamin D deficiency, unspecified: Secondary | ICD-10-CM | POA: Diagnosis present

## 2014-05-31 DIAGNOSIS — M1711 Unilateral primary osteoarthritis, right knee: Secondary | ICD-10-CM

## 2014-05-31 DIAGNOSIS — F411 Generalized anxiety disorder: Secondary | ICD-10-CM | POA: Diagnosis not present

## 2014-05-31 DIAGNOSIS — Z79899 Other long term (current) drug therapy: Secondary | ICD-10-CM | POA: Diagnosis not present

## 2014-05-31 DIAGNOSIS — Z96659 Presence of unspecified artificial knee joint: Secondary | ICD-10-CM | POA: Diagnosis not present

## 2014-05-31 HISTORY — PX: TOTAL KNEE ARTHROPLASTY: SHX125

## 2014-05-31 SURGERY — ARTHROPLASTY, KNEE, TOTAL
Anesthesia: General | Site: Knee | Laterality: Right

## 2014-05-31 MED ORDER — METOCLOPRAMIDE HCL 5 MG/ML IJ SOLN: 5.0000 mg | Freq: Three times a day (TID) | INTRAMUSCULAR | Status: DC | PRN

## 2014-05-31 MED ORDER — METHOCARBAMOL 1000 MG/10ML IJ SOLN
500.0000 mg | Freq: Four times a day (QID) | INTRAMUSCULAR | Status: DC | PRN
  Administered 2014-05-31: 500 mg via INTRAVENOUS
  Filled 2014-05-31: qty 5

## 2014-05-31 MED ORDER — AMLODIPINE BESYLATE 10 MG PO TABS
10.0000 mg | ORAL_TABLET | Freq: Every morning | ORAL | Status: DC
  Administered 2014-06-01 – 2014-06-02 (×2): 10 mg via ORAL
  Filled 2014-05-31 (×2): qty 1

## 2014-05-31 MED ORDER — DEXAMETHASONE SODIUM PHOSPHATE 10 MG/ML IJ SOLN
10.0000 mg | Freq: Every day | INTRAMUSCULAR | Status: DC
Start: 2014-06-01 — End: 2014-06-02
  Administered 2014-06-01: 10 mg via INTRAVENOUS
  Filled 2014-05-31 (×2): qty 1

## 2014-05-31 MED ORDER — BUPIVACAINE HCL (PF) 0.25 % IJ SOLN
INTRAMUSCULAR | Status: AC
  Filled 2014-05-31: qty 30

## 2014-05-31 MED ORDER — PHENYLEPHRINE 40 MCG/ML (10ML) SYRINGE FOR IV PUSH (FOR BLOOD PRESSURE SUPPORT)
PREFILLED_SYRINGE | INTRAVENOUS | Status: AC
  Filled 2014-05-31: qty 10

## 2014-05-31 MED ORDER — LIDOCAINE HCL (CARDIAC) 20 MG/ML IV SOLN
INTRAVENOUS | Status: DC | PRN
  Administered 2014-05-31: 100 mg via INTRAVENOUS

## 2014-05-31 MED ORDER — FAMOTIDINE 40 MG PO TABS
40.0000 mg | ORAL_TABLET | Freq: Two times a day (BID) | ORAL | Status: DC
  Administered 2014-05-31 – 2014-06-02 (×4): 40 mg via ORAL
  Filled 2014-05-31 (×5): qty 1

## 2014-05-31 MED ORDER — DEXAMETHASONE SODIUM PHOSPHATE 10 MG/ML IJ SOLN
10.0000 mg | Freq: Once | INTRAMUSCULAR | Status: AC
  Administered 2014-05-31: 10 mg via INTRAVENOUS

## 2014-05-31 MED ORDER — METOPROLOL TARTRATE 25 MG PO TABS
25.0000 mg | ORAL_TABLET | Freq: Two times a day (BID) | ORAL | Status: DC
  Administered 2014-05-31 – 2014-06-02 (×4): 25 mg via ORAL
  Filled 2014-05-31 (×5): qty 1

## 2014-05-31 MED ORDER — CEFAZOLIN SODIUM-DEXTROSE 2-3 GM-% IV SOLR
INTRAVENOUS | Status: AC
  Filled 2014-05-31: qty 50

## 2014-05-31 MED ORDER — ONDANSETRON HCL 4 MG PO TABS
4.0000 mg | ORAL_TABLET | Freq: Four times a day (QID) | ORAL | Status: DC | PRN
  Administered 2014-05-31: 4 mg via ORAL
  Filled 2014-05-31: qty 1

## 2014-05-31 MED ORDER — BUPIVACAINE LIPOSOME 1.3 % IJ SUSP
INTRAMUSCULAR | Status: DC | PRN
  Administered 2014-05-31: 20 mL

## 2014-05-31 MED ORDER — PROPOFOL 10 MG/ML IV BOLUS
INTRAVENOUS | Status: DC | PRN
  Administered 2014-05-31 (×4): 20 mg via INTRAVENOUS
  Administered 2014-05-31: 140 mg via INTRAVENOUS

## 2014-05-31 MED ORDER — SUFENTANIL CITRATE 50 MCG/ML IV SOLN
INTRAVENOUS | Status: AC
  Filled 2014-05-31: qty 1

## 2014-05-31 MED ORDER — ALBUTEROL SULFATE HFA 108 (90 BASE) MCG/ACT IN AERS: 2.0000 | INHALATION_SPRAY | Freq: Four times a day (QID) | RESPIRATORY_TRACT | Status: DC | PRN

## 2014-05-31 MED ORDER — FENTANYL CITRATE 0.05 MG/ML IJ SOLN
25.0000 ug | INTRAMUSCULAR | Status: DC | PRN
  Administered 2014-05-31 (×3): 50 ug via INTRAVENOUS

## 2014-05-31 MED ORDER — KETOROLAC TROMETHAMINE 15 MG/ML IJ SOLN
7.5000 mg | Freq: Four times a day (QID) | INTRAMUSCULAR | Status: AC | PRN
  Administered 2014-05-31: 7.5 mg via INTRAVENOUS
  Filled 2014-05-31: qty 1

## 2014-05-31 MED ORDER — DOCUSATE SODIUM 100 MG PO CAPS
100.0000 mg | ORAL_CAPSULE | Freq: Two times a day (BID) | ORAL | Status: DC
Start: 2014-05-31 — End: 2014-06-02
  Administered 2014-05-31 – 2014-06-02 (×4): 100 mg via ORAL

## 2014-05-31 MED ORDER — CISATRACURIUM BESYLATE (PF) 10 MG/5ML IV SOLN
INTRAVENOUS | Status: DC | PRN
  Administered 2014-05-31: 6 mg via INTRAVENOUS

## 2014-05-31 MED ORDER — POTASSIUM CHLORIDE CRYS ER 20 MEQ PO TBCR
20.0000 meq | EXTENDED_RELEASE_TABLET | Freq: Two times a day (BID) | ORAL | Status: DC
  Administered 2014-05-31 – 2014-06-02 (×4): 20 meq via ORAL
  Filled 2014-05-31 (×5): qty 1

## 2014-05-31 MED ORDER — KETAMINE HCL 10 MG/ML IJ SOLN
INTRAMUSCULAR | Status: DC | PRN
  Administered 2014-05-31: 10 mg via INTRAVENOUS
  Administered 2014-05-31: 30 mg via INTRAVENOUS

## 2014-05-31 MED ORDER — SODIUM CHLORIDE 0.9 % IV SOLN
INTRAVENOUS | Status: DC
Start: 2014-05-31 — End: 2014-05-31

## 2014-05-31 MED ORDER — SODIUM CHLORIDE 0.9 % IV SOLN
10.0000 mg | INTRAVENOUS | Status: DC | PRN
  Administered 2014-05-31: 50 ug/min via INTRAVENOUS

## 2014-05-31 MED ORDER — LACTATED RINGERS IV SOLN
INTRAVENOUS | Status: DC | PRN
  Administered 2014-05-31 (×2): via INTRAVENOUS

## 2014-05-31 MED ORDER — PROPOFOL 10 MG/ML IV BOLUS
INTRAVENOUS | Status: AC
  Filled 2014-05-31: qty 20

## 2014-05-31 MED ORDER — LIDOCAINE HCL (CARDIAC) 20 MG/ML IV SOLN
INTRAVENOUS | Status: AC
  Filled 2014-05-31: qty 5

## 2014-05-31 MED ORDER — SUFENTANIL CITRATE 50 MCG/ML IV SOLN
INTRAVENOUS | Status: DC | PRN
  Administered 2014-05-31: 10 ug via INTRAVENOUS
  Administered 2014-05-31: 20 ug via INTRAVENOUS
  Administered 2014-05-31 (×2): 5 ug via INTRAVENOUS
  Administered 2014-05-31: 10 ug via INTRAVENOUS

## 2014-05-31 MED ORDER — FENTANYL CITRATE 0.05 MG/ML IJ SOLN
INTRAMUSCULAR | Status: AC
  Filled 2014-05-31: qty 2

## 2014-05-31 MED ORDER — CYCLOBENZAPRINE HCL 10 MG PO TABS
20.0000 mg | ORAL_TABLET | Freq: Every day | ORAL | Status: DC
  Administered 2014-05-31 – 2014-06-01 (×2): 20 mg via ORAL
  Filled 2014-05-31 (×4): qty 2

## 2014-05-31 MED ORDER — HYDROMORPHONE HCL PF 1 MG/ML IJ SOLN: 0.5000 mg | INTRAMUSCULAR | Status: DC | PRN

## 2014-05-31 MED ORDER — PROMETHAZINE HCL 25 MG/ML IJ SOLN
INTRAMUSCULAR | Status: AC
  Filled 2014-05-31: qty 1

## 2014-05-31 MED ORDER — SODIUM CHLORIDE 0.9 % IJ SOLN
INTRAMUSCULAR | Status: DC | PRN
  Administered 2014-05-31: 30 mL

## 2014-05-31 MED ORDER — TRIAMTERENE-HCTZ 37.5-25 MG PO TABS
1.0000 | ORAL_TABLET | Freq: Every day | ORAL | Status: DC
  Administered 2014-06-01 – 2014-06-02 (×2): 1 via ORAL
  Filled 2014-05-31 (×2): qty 1

## 2014-05-31 MED ORDER — BUPIVACAINE HCL 0.25 % IJ SOLN
INTRAMUSCULAR | Status: DC | PRN
  Administered 2014-05-31: 20 mL

## 2014-05-31 MED ORDER — FERROUS SULFATE 325 (65 FE) MG PO TABS
325.0000 mg | ORAL_TABLET | Freq: Every day | ORAL | Status: DC
  Administered 2014-06-01 – 2014-06-02 (×2): 325 mg via ORAL
  Filled 2014-05-31 (×4): qty 1

## 2014-05-31 MED ORDER — MIDAZOLAM HCL 5 MG/5ML IJ SOLN
INTRAMUSCULAR | Status: DC | PRN
  Administered 2014-05-31: 2 mg via INTRAVENOUS

## 2014-05-31 MED ORDER — ALBUTEROL SULFATE (2.5 MG/3ML) 0.083% IN NEBU
2.5000 mg | INHALATION_SOLUTION | Freq: Four times a day (QID) | RESPIRATORY_TRACT | Status: DC | PRN
Start: 2014-05-31 — End: 2014-06-02

## 2014-05-31 MED ORDER — KETAMINE HCL 10 MG/ML IJ SOLN
INTRAMUSCULAR | Status: AC
  Filled 2014-05-31: qty 1

## 2014-05-31 MED ORDER — CLONAZEPAM 1 MG PO TABS
1.0000 mg | ORAL_TABLET | Freq: Every day | ORAL | Status: DC
  Administered 2014-05-31 – 2014-06-01 (×2): 1 mg via ORAL
  Filled 2014-05-31 (×2): qty 1

## 2014-05-31 MED ORDER — ACETAMINOPHEN 500 MG PO TABS
1000.0000 mg | ORAL_TABLET | Freq: Four times a day (QID) | ORAL | Status: AC
  Administered 2014-05-31 – 2014-06-01 (×4): 1000 mg via ORAL
  Filled 2014-05-31 (×4): qty 2

## 2014-05-31 MED ORDER — NEOSTIGMINE METHYLSULFATE 10 MG/10ML IV SOLN
INTRAVENOUS | Status: DC | PRN
  Administered 2014-05-31: 4 mg via INTRAVENOUS

## 2014-05-31 MED ORDER — DIPHENHYDRAMINE HCL 12.5 MG/5ML PO ELIX: 12.5000 mg | ORAL_SOLUTION | ORAL | Status: DC | PRN

## 2014-05-31 MED ORDER — OXYCODONE HCL 5 MG PO TABS
5.0000 mg | ORAL_TABLET | ORAL | Status: DC | PRN
  Administered 2014-05-31 – 2014-06-02 (×14): 10 mg via ORAL
  Filled 2014-05-31 (×14): qty 2

## 2014-05-31 MED ORDER — IRBESARTAN 300 MG PO TABS
300.0000 mg | ORAL_TABLET | Freq: Every day | ORAL | Status: DC
  Administered 2014-06-01 – 2014-06-02 (×2): 300 mg via ORAL
  Filled 2014-05-31 (×3): qty 1

## 2014-05-31 MED ORDER — CEFAZOLIN SODIUM-DEXTROSE 2-3 GM-% IV SOLR
2.0000 g | Freq: Four times a day (QID) | INTRAVENOUS | Status: AC
  Administered 2014-05-31 (×2): 2 g via INTRAVENOUS
  Filled 2014-05-31 (×2): qty 50

## 2014-05-31 MED ORDER — CEFAZOLIN SODIUM-DEXTROSE 2-3 GM-% IV SOLR
2.0000 g | INTRAVENOUS | Status: AC
  Administered 2014-05-31: 2 g via INTRAVENOUS

## 2014-05-31 MED ORDER — BUPROPION HCL ER (SR) 150 MG PO TB12
150.0000 mg | ORAL_TABLET | Freq: Every morning | ORAL | Status: DC
  Administered 2014-06-01 – 2014-06-02 (×2): 150 mg via ORAL
  Filled 2014-05-31 (×2): qty 1

## 2014-05-31 MED ORDER — BISACODYL 10 MG RE SUPP: 10.0000 mg | Freq: Every day | RECTAL | Status: DC | PRN

## 2014-05-31 MED ORDER — GLYCOPYRROLATE 0.2 MG/ML IJ SOLN
INTRAMUSCULAR | Status: DC | PRN
  Administered 2014-05-31: .6 mg via INTRAVENOUS

## 2014-05-31 MED ORDER — ACETAMINOPHEN 10 MG/ML IV SOLN
1000.0000 mg | Freq: Once | INTRAVENOUS | Status: AC
  Administered 2014-05-31: 1000 mg via INTRAVENOUS
  Filled 2014-05-31: qty 100

## 2014-05-31 MED ORDER — POTASSIUM CHLORIDE IN NACL 20-0.9 MEQ/L-% IV SOLN
INTRAVENOUS | Status: DC
  Administered 2014-05-31 – 2014-06-01 (×2): via INTRAVENOUS
  Filled 2014-05-31 (×4): qty 1000

## 2014-05-31 MED ORDER — RIVAROXABAN 10 MG PO TABS
10.0000 mg | ORAL_TABLET | Freq: Every day | ORAL | Status: DC
  Administered 2014-06-01 – 2014-06-02 (×2): 10 mg via ORAL
  Filled 2014-05-31 (×4): qty 1

## 2014-05-31 MED ORDER — FLEET ENEMA 7-19 GM/118ML RE ENEM: 1.0000 | ENEMA | Freq: Once | RECTAL | Status: AC | PRN

## 2014-05-31 MED ORDER — METHOCARBAMOL 500 MG PO TABS
500.0000 mg | ORAL_TABLET | Freq: Four times a day (QID) | ORAL | Status: DC | PRN
  Administered 2014-05-31 – 2014-06-02 (×4): 500 mg via ORAL
  Filled 2014-05-31 (×4): qty 1

## 2014-05-31 MED ORDER — CHLORHEXIDINE GLUCONATE 4 % EX LIQD: 60.0000 mL | Freq: Once | CUTANEOUS | Status: DC

## 2014-05-31 MED ORDER — DEXAMETHASONE SODIUM PHOSPHATE 10 MG/ML IJ SOLN
INTRAMUSCULAR | Status: AC
  Filled 2014-05-31: qty 1

## 2014-05-31 MED ORDER — POLYETHYLENE GLYCOL 3350 17 G PO PACK
17.0000 g | PACK | Freq: Every day | ORAL | Status: DC | PRN
  Administered 2014-06-01: 17 g via ORAL

## 2014-05-31 MED ORDER — METOPROLOL TARTRATE 25 MG PO TABS
25.0000 mg | ORAL_TABLET | Freq: Two times a day (BID) | ORAL | Status: DC
  Administered 2014-05-31: 25 mg via ORAL
  Filled 2014-05-31: qty 1

## 2014-05-31 MED ORDER — EPHEDRINE SULFATE 50 MG/ML IJ SOLN
INTRAMUSCULAR | Status: AC
  Filled 2014-05-31: qty 1

## 2014-05-31 MED ORDER — BUPIVACAINE LIPOSOME 1.3 % IJ SUSP
20.0000 mL | Freq: Once | INTRAMUSCULAR | Status: DC
  Filled 2014-05-31: qty 20

## 2014-05-31 MED ORDER — FENTANYL CITRATE 0.05 MG/ML IJ SOLN
INTRAMUSCULAR | Status: DC | PRN
  Administered 2014-05-31: 100 ug via INTRAVENOUS

## 2014-05-31 MED ORDER — ACETAMINOPHEN 325 MG PO TABS
650.0000 mg | ORAL_TABLET | Freq: Four times a day (QID) | ORAL | Status: DC | PRN
Start: 2014-06-01 — End: 2014-06-02

## 2014-05-31 MED ORDER — EPHEDRINE SULFATE 50 MG/ML IJ SOLN
INTRAMUSCULAR | Status: DC | PRN
  Administered 2014-05-31 (×3): 10 mg via INTRAVENOUS

## 2014-05-31 MED ORDER — ONDANSETRON HCL 4 MG/2ML IJ SOLN: 4.0000 mg | Freq: Four times a day (QID) | INTRAMUSCULAR | Status: DC | PRN

## 2014-05-31 MED ORDER — PROMETHAZINE HCL 25 MG/ML IJ SOLN
6.2500 mg | INTRAMUSCULAR | Status: DC | PRN
  Administered 2014-05-31: 6.25 mg via INTRAVENOUS

## 2014-05-31 MED ORDER — PHENOL 1.4 % MT LIQD: 1.0000 | OROMUCOSAL | Status: DC | PRN

## 2014-05-31 MED ORDER — ACETAMINOPHEN 650 MG RE SUPP: 650.0000 mg | Freq: Four times a day (QID) | RECTAL | Status: DC | PRN

## 2014-05-31 MED ORDER — MENTHOL 3 MG MT LOZG: 1.0000 | LOZENGE | OROMUCOSAL | Status: DC | PRN

## 2014-05-31 MED ORDER — ONDANSETRON HCL 4 MG/2ML IJ SOLN
INTRAMUSCULAR | Status: AC
  Filled 2014-05-31: qty 2

## 2014-05-31 MED ORDER — MIDAZOLAM HCL 2 MG/2ML IJ SOLN
INTRAMUSCULAR | Status: AC
  Filled 2014-05-31: qty 2

## 2014-05-31 MED ORDER — ZOLPIDEM TARTRATE 5 MG PO TABS
5.0000 mg | ORAL_TABLET | Freq: Every day | ORAL | Status: DC
  Administered 2014-05-31: 5 mg via ORAL
  Filled 2014-05-31: qty 1

## 2014-05-31 MED ORDER — SODIUM CHLORIDE 0.9 % IJ SOLN
INTRAMUSCULAR | Status: AC
  Filled 2014-05-31: qty 50

## 2014-05-31 MED ORDER — ONDANSETRON HCL 4 MG/2ML IJ SOLN
INTRAMUSCULAR | Status: DC | PRN
  Administered 2014-05-31: 4 mg via INTRAVENOUS

## 2014-05-31 MED ORDER — TRANEXAMIC ACID 100 MG/ML IV SOLN
1000.0000 mg | INTRAVENOUS | Status: AC
  Administered 2014-05-31: 1000 mg via INTRAVENOUS
  Filled 2014-05-31: qty 10

## 2014-05-31 MED ORDER — SODIUM CHLORIDE 0.9 % IJ SOLN
INTRAMUSCULAR | Status: AC
  Filled 2014-05-31: qty 10

## 2014-05-31 MED ORDER — DEXAMETHASONE 6 MG PO TABS
10.0000 mg | ORAL_TABLET | Freq: Every day | ORAL | Status: DC
  Administered 2014-06-02: 10 mg via ORAL
  Filled 2014-05-31 (×2): qty 1

## 2014-05-31 MED ORDER — CISATRACURIUM BESYLATE 20 MG/10ML IV SOLN
INTRAVENOUS | Status: AC
  Filled 2014-05-31: qty 10

## 2014-05-31 MED ORDER — SUCCINYLCHOLINE CHLORIDE 20 MG/ML IJ SOLN
INTRAMUSCULAR | Status: DC | PRN
  Administered 2014-05-31: 100 mg via INTRAVENOUS

## 2014-05-31 MED ORDER — METOCLOPRAMIDE HCL 10 MG PO TABS: 5.0000 mg | ORAL_TABLET | Freq: Three times a day (TID) | ORAL | Status: DC | PRN

## 2014-05-31 SURGICAL SUPPLY — 60 items
BAG ZIPLOCK 12X15 (MISCELLANEOUS) ×3 IMPLANT
BANDAGE ELASTIC 6 VELCRO ST LF (GAUZE/BANDAGES/DRESSINGS) ×3 IMPLANT
BANDAGE ESMARK 6X9 LF (GAUZE/BANDAGES/DRESSINGS) ×1 IMPLANT
BLADE SAG 18X100X1.27 (BLADE) ×3 IMPLANT
BLADE SAW SGTL 11.0X1.19X90.0M (BLADE) ×3 IMPLANT
BNDG ESMARK 6X9 LF (GAUZE/BANDAGES/DRESSINGS) ×3
BOWL SMART MIX CTS (DISPOSABLE) ×3 IMPLANT
CAPT RP KNEE ×3 IMPLANT
CEMENT HV SMART SET (Cement) ×6 IMPLANT
CLOSURE WOUND 1/2 X4 (GAUZE/BANDAGES/DRESSINGS) ×1
CUFF TOURN SGL QUICK 34 (TOURNIQUET CUFF) ×2
CUFF TRNQT CYL 34X4X40X1 (TOURNIQUET CUFF) ×1 IMPLANT
DECANTER SPIKE VIAL GLASS SM (MISCELLANEOUS) ×3 IMPLANT
DRAPE EXTREMITY T 121X128X90 (DRAPE) ×3 IMPLANT
DRAPE POUCH INSTRU U-SHP 10X18 (DRAPES) ×3 IMPLANT
DRAPE U-SHAPE 47X51 STRL (DRAPES) ×3 IMPLANT
DRSG ADAPTIC 3X8 NADH LF (GAUZE/BANDAGES/DRESSINGS) IMPLANT
DRSG EMULSION OIL 3X16 NADH (GAUZE/BANDAGES/DRESSINGS) ×3 IMPLANT
DRSG PAD ABDOMINAL 8X10 ST (GAUZE/BANDAGES/DRESSINGS) ×3 IMPLANT
DURAPREP 26ML APPLICATOR (WOUND CARE) ×3 IMPLANT
ELECT REM PT RETURN 9FT ADLT (ELECTROSURGICAL) ×3
ELECTRODE REM PT RTRN 9FT ADLT (ELECTROSURGICAL) ×1 IMPLANT
EVACUATOR 1/8 PVC DRAIN (DRAIN) ×3 IMPLANT
FACESHIELD WRAPAROUND (MASK) ×15 IMPLANT
GAUZE SPONGE 4X4 12PLY STRL (GAUZE/BANDAGES/DRESSINGS) ×3 IMPLANT
GLOVE BIO SURGEON STRL SZ7.5 (GLOVE) IMPLANT
GLOVE BIO SURGEON STRL SZ8 (GLOVE) ×3 IMPLANT
GLOVE BIOGEL PI IND STRL 6.5 (GLOVE) IMPLANT
GLOVE BIOGEL PI IND STRL 8 (GLOVE) ×1 IMPLANT
GLOVE BIOGEL PI INDICATOR 6.5 (GLOVE)
GLOVE BIOGEL PI INDICATOR 8 (GLOVE) ×2
GLOVE SURG SS PI 6.5 STRL IVOR (GLOVE) IMPLANT
GOWN STRL REUS W/TWL LRG LVL3 (GOWN DISPOSABLE) ×3 IMPLANT
GOWN STRL REUS W/TWL XL LVL3 (GOWN DISPOSABLE) IMPLANT
HANDPIECE INTERPULSE COAX TIP (DISPOSABLE) ×2
IMMOBILIZER KNEE 20 (SOFTGOODS) ×3 IMPLANT
IMMOBILIZER KNEE 20 THIGH 36 (SOFTGOODS) IMPLANT
KIT BASIN OR (CUSTOM PROCEDURE TRAY) ×3 IMPLANT
MANIFOLD NEPTUNE II (INSTRUMENTS) ×3 IMPLANT
NDL SAFETY ECLIPSE 18X1.5 (NEEDLE) ×2 IMPLANT
NEEDLE HYPO 18GX1.5 SHARP (NEEDLE) ×4
NS IRRIG 1000ML POUR BTL (IV SOLUTION) ×3 IMPLANT
PACK TOTAL JOINT (CUSTOM PROCEDURE TRAY) ×3 IMPLANT
PAD ABD 8X10 STRL (GAUZE/BANDAGES/DRESSINGS) ×3 IMPLANT
PADDING CAST COTTON 6X4 STRL (CAST SUPPLIES) ×3 IMPLANT
POSITIONER SURGICAL ARM (MISCELLANEOUS) ×3 IMPLANT
SET HNDPC FAN SPRY TIP SCT (DISPOSABLE) ×1 IMPLANT
STRIP CLOSURE SKIN 1/2X4 (GAUZE/BANDAGES/DRESSINGS) ×2 IMPLANT
SUCTION FRAZIER 12FR DISP (SUCTIONS) ×3 IMPLANT
SUT MNCRL AB 4-0 PS2 18 (SUTURE) ×3 IMPLANT
SUT VIC AB 2-0 CT1 27 (SUTURE) ×6
SUT VIC AB 2-0 CT1 TAPERPNT 27 (SUTURE) ×3 IMPLANT
SUT VLOC 180 0 24IN GS25 (SUTURE) ×3 IMPLANT
SYR 20CC LL (SYRINGE) ×3 IMPLANT
SYR 50ML LL SCALE MARK (SYRINGE) ×3 IMPLANT
TOWEL OR 17X26 10 PK STRL BLUE (TOWEL DISPOSABLE) ×3 IMPLANT
TOWEL OR NON WOVEN STRL DISP B (DISPOSABLE) IMPLANT
TRAY FOLEY CATH 14FRSI W/METER (CATHETERS) ×3 IMPLANT
WATER STERILE IRR 1500ML POUR (IV SOLUTION) ×3 IMPLANT
WRAP KNEE MAXI GEL POST OP (GAUZE/BANDAGES/DRESSINGS) ×3 IMPLANT

## 2014-05-31 NOTE — Transfer of Care (Signed)
Immediate Anesthesia Transfer of Care Note  Patient: Debra Griffith  Procedure(s) Performed: Procedure(s): RIGHT TOTAL KNEE ARTHROPLASTY (Right)  Patient Location: PACU  Anesthesia Type:General  Level of Consciousness: awake, alert  and oriented  Airway & Oxygen Therapy: Patient Spontanous Breathing and Patient connected to face mask oxygen  Post-op Assessment: Report given to PACU RN and Post -op Vital signs reviewed and stable  Post vital signs: Reviewed and stable  Complications: No apparent anesthesia complications

## 2014-05-31 NOTE — Evaluation (Signed)
Physical Therapy Evaluation Patient Details Name: Debra Griffith MRN: [id number] DOB: [birth year] Today's Date: 05/31/2014   History of Present Illness  RTKA  Clinical Impression  Pt tolerated  Getting into recliner today. Plans SNF. Pt will benefit from PT to address problems listed in this note.    Follow Up Recommendations SNF;Supervision/Assistance - 24 hour    Equipment Recommendations  None recommended by PT    Recommendations for Other Services       Precautions / Restrictions Precautions Precautions: Knee Required Braces or Orthoses: Knee Immobilizer - Right Knee Immobilizer - Right: Discontinue once straight leg raise with < 10 degree lag      Mobility  Bed Mobility Overal bed mobility: Needs Assistance Bed Mobility: Supine to Sit     Supine to sit: Mod assist     General bed mobility comments: extra time , HOB raISED,  support  R leg to floor.  Transfers Overall transfer level: Needs assistance Equipment used: Rolling walker (2 wheeled) Transfers: Sit to/from [facility] Sit to Stand: Mod assist;+2 safety/equipment;From elevated surface Stand pivot transfers: +2 safety/equipment;Mod assist       General transfer comment: cues for safety and hand placement  Ambulation/Gait                Stairs            Wheelchair Mobility    Modified Rankin (Stroke Patients Only)       Balance                                             Pertinent Vitals/Pain Pain Assessment: 0-10 Pain Score: 6  Pain Location: R knee Pain Descriptors / Indicators: Aching Pain Intervention(s): Monitored during session;Repositioned;Ice applied;Patient requesting pain meds-RN notified    Home Living Family/patient expects to be discharged to:: Skilled nursing facility Living Arrangements: Spouse/significant other;Children;Other relatives             Home Equipment: Walker - 4 wheels Additional Comments: has RW     Prior Function Level of Independence: Independent with assistive device(s)         Comments: [name]     Hand Dominance        Extremity/Trunk Assessment   Upper Extremity Assessment: Overall WFL for tasks assessed           Lower Extremity Assessment: RLE deficits/detail;LLE deficits/detail RLE Deficits / Details: able to perform SLR with min assist LLE Deficits / Details: knee flexion 110 degrees.     Communication   Communication: No difficulties  Cognition Arousal/Alertness: Awake/alert Behavior During Therapy: WFL for tasks assessed/performed Overall Cognitive Status: Within Functional Limits for tasks assessed                      General Comments      Exercises        Assessment/Plan    PT Assessment Patient needs continued PT services  PT Diagnosis Difficulty walking;Acute pain   PT Problem List Decreased strength;Decreased range of motion;Decreased activity tolerance;Decreased mobility;Decreased knowledge of precautions;Decreased safety awareness;Decreased knowledge of use of DME;Pain  PT Treatment Interventions DME instruction;Gait training;Functional mobility training;Therapeutic activities;Therapeutic exercise;Patient/family education   PT Goals (Current goals can be found in the Care Plan section) Acute Rehab PT Goals Patient Stated Goal: to walk without pain PT Goal Formulation: With patient Time For Goal Achievement:  06/07/14 Potential to Achieve Goals: Good    Frequency 7X/week   Barriers to discharge Decreased caregiver support      Co-evaluation               End of Session Equipment Utilized During Treatment: Gait belt Activity Tolerance: Patient limited by pain Patient left: in chair;with call bell/phone within reach Nurse Communication: Mobility status;Patient requests pain meds         Time: 6295-2841 PT Time Calculation (min): 25 min   Charges:   PT Evaluation $Initial PT Evaluation Tier I: 1  Procedure PT Treatments $Therapeutic Activity: 23-37 mins   PT G Codes:          [name] 05/31/2014, 6:10 PM Tresa Endo PT [phone]

## 2014-05-31 NOTE — Interval H&P Note (Signed)
History and Physical Interval Note:  05/31/2014 7:01 AM  Debra Griffith  has presented today for surgery, with the diagnosis of OA RIGHT KNEE  The various methods of treatment have been discussed with the patient and family. After consideration of risks, benefits and other options for treatment, the patient has consented to  Procedure(s): RIGHT TOTAL KNEE ARTHROPLASTY (Right) as a surgical intervention .  The patient's history has been reviewed, patient examined, no change in status, stable for surgery.  I have reviewed the patient's chart and labs.  Questions were answered to the patient's satisfaction.     [name]

## 2014-05-31 NOTE — Anesthesia Procedure Notes (Signed)
Procedure Name: Intubation Date/Time: 05/31/2014 8:59 AM Performed by: Danley Danker L Patient Re-evaluated:Patient Re-evaluated prior to inductionOxygen Delivery Method: Circle system utilized Preoxygenation: Pre-oxygenation with 100% oxygen Intubation Type: IV induction Ventilation: Mask ventilation without difficulty and Oral airway inserted - appropriate to patient size Laryngoscope Size: Miller and 3 Grade View: Grade I Tube type: Oral Tube size: 7.5 mm Airway Equipment and Method: Stylet Placement Confirmation: ETT inserted through vocal cords under direct vision,  breath sounds checked- equal and bilateral and positive ETCO2 Secured at: 20 cm Tube secured with: Tape Dental Injury: Teeth and Oropharynx as per pre-operative assessment

## 2014-05-31 NOTE — Progress Notes (Signed)
Clinical Social Work Department BRIEF PSYCHOSOCIAL ASSESSMENT 05/31/2014  Patient:  Debra Griffith, Debra Griffith     Account Number:  [id number]     Admit date:  05/31/2014  Clinical Social Worker:  Lacie Scotts  Date/Time:  05/31/2014 02:27 PM  Referred by:  Physician  Date Referred:  05/31/2014 Referred for  SNF Placement   Other Referral:   Interview type:  Patient Other interview type:    PSYCHOSOCIAL DATA Living Status:  HUSBAND Admitted from facility:   Level of care:   Primary support name:  Roger Primary support relationship to patient:  SPOUSE Degree of support available:   supportive    CURRENT CONCERNS Current Concerns  Post-Acute Placement   Other Concerns:    SOCIAL WORK ASSESSMENT / PLAN Pt is a 73 yr old female living at home with spouse prior to hospitalization. CSW met with pt to assist with d/c planning. This is a planned admission. Pt will most likely need ST Rehab following hospital d/c. Pt has made prior arrangements with [facility] for rehab. CSW has contacted SNF and d/c plans have been confirmed. CSW will continue to follow to assist with d/c planning to SNF.   Assessment/plan status:  Psychosocial Support/Ongoing Assessment of Needs Other assessment/ plan:   Information/referral to community resources:   Insurance coverage for SNF and ambulance transport.    PATIENT'S/FAMILY'S RESPONSE TO PLAN OF CARE: Pt just arrived to unit from surgery. She is sleepy but happy surgery is over and that it went well. Pt remembers csw from previous surgery last winter. Pt had a good experience at [facility] last Feb. and is looking forward to having her rehab there once again.    Werner Lean LCSW [phone]

## 2014-05-31 NOTE — Progress Notes (Signed)
Physical Therapy Treatment Patient Details Name: Debra Griffith MRN: [id number] DOB: November 04, 1940 Today's Date: 05/31/2014    History of Present Illness RTKA    PT Comments    Patient ambulated x15 feet before returning to bed.Tolerated well.  Follow Up Recommendations  SNF;Supervision/Assistance - 24 hour     Equipment Recommendations  None recommended by PT    Recommendations for Other Services       Precautions / Restrictions Precautions Precautions: Knee Required Braces or Orthoses: Knee Immobilizer - Right Knee Immobilizer - Right: Discontinue once straight leg raise with < 10 degree lag    Mobility  Bed Mobility Overal bed mobility: Needs Assistance Bed Mobility: Sit to Supine     Supine to sit: Mod assist Sit to supine: Mod assist   General bed mobility comments: Legs onto bed,  cues for technique  Transfers Overall transfer level: Needs assistance Equipment used: Rolling walker (2 wheeled) Transfers: Sit to/from Stand Sit to Stand: Mod assist;+2 safety/equipment;From elevated surface Stand pivot transfers: +2 safety/equipment;Mod assist       General transfer comment: cues for safety and hand placement  Ambulation/Gait Ambulation/Gait assistance: +2 safety/equipment;Min assist Ambulation Distance (Feet): 15 Feet Assistive device: Rolling walker (2 wheeled) Gait Pattern/deviations: Step-to pattern;Decreased stance time - right;Decreased step length - right;Antalgic     General Gait Details: cues for posture and sequence.   Stairs            Wheelchair Mobility    Modified Rankin (Stroke Patients Only)       Balance                                    Cognition Arousal/Alertness: Awake/alert Behavior During Therapy: WFL for tasks assessed/performed Overall Cognitive Status: Within Functional Limits for tasks assessed                      Exercises      General Comments        Pertinent Vitals/Pain  Pain Assessment: 0-10 Pain Score: 6  Pain Location: started a 3 , ambulation increased to 6 R knee Pain Descriptors / Indicators: Aching Pain Intervention(s): Monitored during session;Repositioned;Ice applied;Patient requesting pain meds-RN notified    Home Living Family/patient expects to be discharged to:: Skilled nursing facility Living Arrangements: Spouse/significant other;Children;Other relatives           Home Equipment: Walker - 4 wheels Additional Comments: has RW    Prior Function Level of Independence: Independent with assistive device(s)      Comments: Debra Griffith   PT Goals (current goals can now be found in the care plan section) Acute Rehab PT Goals Patient Stated Goal: to walk without pain PT Goal Formulation: With patient Time For Goal Achievement: 06/07/14 Potential to Achieve Goals: Good Progress towards PT goals: Progressing toward goals    Frequency  7X/week    PT Plan Current plan remains appropriate    Co-evaluation             End of Session Equipment Utilized During Treatment: Gait belt Activity Tolerance: Patient tolerated treatment well Patient left: in bed;with call bell/phone within reach     Time: 1651-1707 PT Time Calculation (min): 16 min  Charges:  $Gait Training: 8-22 mins $Therapeutic Activity: 23-37 mins                    G Codes:  Debra Griffith 05/31/2014, 6:18 PM

## 2014-05-31 NOTE — Anesthesia Postprocedure Evaluation (Signed)
  Anesthesia Post-op Note  Patient: Debra Griffith  Procedure(s) Performed: Procedure(s) (LRB): RIGHT TOTAL KNEE ARTHROPLASTY (Right)  Patient Location: PACU  Anesthesia Type: General  Level of Consciousness: awake and alert   Airway and Oxygen Therapy: Patient Spontanous Breathing  Post-op Pain: mild  Post-op Assessment: Post-op Vital signs reviewed, Patient's Cardiovascular Status Stable, Respiratory Function Stable, Patent Airway and No signs of Nausea or vomiting  Last Vitals:  Filed Vitals:   05/31/14 1021  BP: 100/55  Pulse: 61  Temp:   Resp: 12    Post-op Vital Signs: stable   Complications: No apparent anesthesia complications-

## 2014-05-31 NOTE — Op Note (Signed)
Pre-operative diagnosis- Osteoarthritis  Right knee(s)  Post-operative diagnosis- Osteoarthritis Right knee(s)  Procedure-  Right  Total Knee Arthroplasty  Surgeon- Dione Plover. Adal Sereno, MD  Assistant- Arlee Muslim, PA-C   Anesthesia-  General  EBL-* No blood loss amount entered *   Drains Hemovac  Tourniquet time-  Total Tourniquet Time Documented: Thigh (Right) - 32 minutes Total: Thigh (Right) - 32 minutes     Complications- None  Condition-PACU - hemodynamically stable.   Brief Clinical Note  Debra Griffith is a 73 y.o. year old female with end stage OA of her right knee with progressively worsening pain and dysfunction. She has constant pain, with activity and at rest and significant functional deficits with difficulties even with ADLs. She has had extensive non-op management including analgesics, injections of cortisone and viscosupplements, and home exercise program, but remains in significant pain with significant dysfunction.Radiographs show bone on bone arthritis medial and patellofemoral. She presents now for right Total Knee Arthroplasty.     Procedure in detail---   The patient is brought into the operating room and positioned supine on the operating table. After successful administration of  General,   a tourniquet is placed high on the  Right thigh(s) and the lower extremity is prepped and draped in the usual sterile fashion. Time out is performed by the operating team and then the  Right lower extremity is wrapped in Esmarch, knee flexed and the tourniquet inflated to 300 mmHg.       A midline incision is made with a ten blade through the subcutaneous tissue to the level of the extensor mechanism. A fresh blade is used to make a medial parapatellar arthrotomy. Soft tissue over the proximal medial tibia is subperiosteally elevated to the joint line with a knife and into the semimembranosus bursa with a Cobb elevator. Soft tissue over the proximal lateral tibia is elevated  with attention being paid to avoiding the patellar tendon on the tibial tubercle. The patella is everted, knee flexed 90 degrees and the ACL and PCL are removed. Findings are bone on bone medial and patellofemoral.        The drill is used to create a starting hole in the distal femur and the canal is thoroughly irrigated with sterile saline to remove the fatty contents. The 5 degree Right  valgus alignment guide is placed into the femoral canal and the distal femoral cutting block is pinned to remove 10 mm off the distal femur. Resection is made with an oscillating saw.      The tibia is subluxed forward and the menisci are removed. The extramedullary alignment guide is placed referencing proximally at the medial aspect of the tibial tubercle and distally along the second metatarsal axis and tibial crest. The block is pinned to remove 76mm off the more deficient medial  side. Resection is made with an oscillating saw. Size 3is the most appropriate size for the tibia and the proximal tibia is prepared with the modular drill and keel punch for that size.      The femoral sizing guide is placed and size 3 is most appropriate. Rotation is marked off the epicondylar axis and confirmed by creating a rectangular flexion gap at 90 degrees. The size 3 cutting block is pinned in this rotation and the anterior, posterior and chamfer cuts are made with the oscillating saw. The intercondylar block is then placed and that cut is made.      Trial size 3 tibial component, trial size 3 posterior  stabilized femur and a 10  mm posterior stabilized rotating platform insert trial is placed. Full extension is achieved with excellent varus/valgus and anterior/posterior balance throughout full range of motion. The patella is everted and thickness measured to be 22  mm. Free hand resection is taken to 12 mm, a 35 template is placed, lug holes are drilled, trial patella is placed, and it tracks normally. Osteophytes are removed off the  posterior femur with the trial in place. All trials are removed and the cut bone surfaces prepared with pulsatile lavage. Cement is mixed and once ready for implantation, the size 3 tibial implant, size  3 posterior stabilized femoral component, and the size 35 patella are cemented in place and the patella is held with the clamp. The trial insert is placed and the knee held in full extension. The Exparel (20 ml mixed with 30 ml saline) and .25% Bupivicaine, are injected into the extensor mechanism, posterior capsule, medial and lateral gutters and subcutaneous tissues.  All extruded cement is removed and once the cement is hard the permanent 10 mm posterior stabilized rotating platform insert is placed into the tibial tray.      The wound is copiously irrigated with saline solution and the extensor mechanism closed over a hemovac drain with #1 V-loc suture. The tourniquet is released for a total tourniquet time of 32  minutes. Flexion against gravity is 135 degrees and the patella tracks normally. Subcutaneous tissue is closed with 2.0 vicryl and subcuticular with running 4.0 Monocryl. The incision is cleaned and dried and steri-strips and a bulky sterile dressing are applied. The limb is placed into a knee immobilizer and the patient is awakened and transported to recovery in stable condition.      Please note that a surgical assistant was a medical necessity for this procedure in order to perform it in a safe and expeditious manner. Surgical assistant was necessary to retract the ligaments and vital neurovascular structures to prevent injury to them and also necessary for proper positioning of the limb to allow for anatomic placement of the prosthesis.   Dione Plover Debra Woods, MD    05/31/2014, 9:48 AM

## 2014-05-31 NOTE — Progress Notes (Signed)
Utilization review completed.  

## 2014-05-31 NOTE — Addendum Note (Signed)
Addendum created 05/31/14 1158 by Sharlette Dense, CRNA   Modules edited: SmartForms   SmartForms:  VN Section for SmartForms 146

## 2014-05-31 NOTE — Progress Notes (Signed)
Clinical Social Work Department CLINICAL SOCIAL WORK PLACEMENT NOTE 05/31/2014  Patient:  Debra Griffith, Debra Griffith  Account Number:  [id number] Admit date:  05/31/2014  Clinical Social Worker:  Werner Lean, LCSW  Date/time:  05/31/2014 02:57 PM  Clinical Social Work is seeking post-discharge placement for this patient at the following level of care:   SKILLED NURSING   (*CSW will update this form in Epic as items are completed)     Patient/family provided with [place] Department of Clinical Social Work's list of facilities offering this level of care within the geographic area requested by the patient (or if unable, by the patient's family).  05/31/2014  Patient/family informed of their freedom to choose among providers that offer the needed level of care, that participate in Medicare, Medicaid or managed care program needed by the patient, have an available bed and are willing to accept the patient.    Patient/family informed of [facility]' ownership interest in [facility], as well as of the fact that they are under no obligation to receive care at this facility.  PASARR submitted to EDS on 05/31/2014 PASARR number received on 05/31/2014  FL2 transmitted to all facilities in geographic area requested by pt/family on  05/31/2014 FL2 transmitted to all facilities within larger geographic area on   Patient informed that his/her managed care company has contracts with or will negotiate with  certain facilities, including the following:     Patient/family informed of bed offers received:  05/31/2014 Patient chooses bed at  Physician recommends and patient chooses bed at  [facility]  Patient to be transferred to  on   Patient to be transferred to facility by  Patient and family notified of transfer on  Name of family member notified:    The following physician request were entered in Epic:   Additional Comments:  Bonnita Nasuti LCSW [phone]

## 2014-05-31 NOTE — Progress Notes (Signed)
Patient has been taking Cipro for UTI

## 2014-06-01 ENCOUNTER — Encounter (HOSPITAL_COMMUNITY): Payer: Self-pay | Admitting: Orthopedic Surgery

## 2014-06-01 LAB — BASIC METABOLIC PANEL
ANION GAP: 10 (ref 5–15)
BUN: 19 mg/dL (ref 6–23)
CHLORIDE: 99 meq/L (ref 96–112)
CO2: 26 mEq/L (ref 19–32)
Calcium: 9.1 mg/dL (ref 8.4–10.5)
Creatinine, Ser: 0.94 mg/dL (ref 0.50–1.10)
GFR calc non Af Amer: 59 mL/min — ABNORMAL LOW (ref >90)
GFR, EST AFRICAN AMERICAN: 69 mL/min — AB (ref >90)
Glucose, Bld: 150 mg/dL — ABNORMAL HIGH (ref 70–99)
POTASSIUM: 4.3 meq/L (ref 3.7–5.3)
SODIUM: 135 meq/L — AB (ref 137–147)

## 2014-06-01 LAB — CBC
HCT: 31 % — ABNORMAL LOW (ref 36.0–46.0)
Hemoglobin: 10.4 g/dL — ABNORMAL LOW (ref 12.0–15.0)
MCH: 29.4 pg (ref 26.0–34.0)
MCHC: 33.5 g/dL (ref 30.0–36.0)
MCV: 87.6 fL (ref 78.0–100.0)
PLATELETS: 258 10*3/uL (ref 150–400)
RBC: 3.54 MIL/uL — ABNORMAL LOW (ref 3.87–5.11)
RDW: 13.3 % (ref 11.5–15.5)
WBC: 10 10*3/uL (ref 4.0–10.5)

## 2014-06-01 LAB — TYPE AND SCREEN
ABO/RH(D): A POS
ANTIBODY SCREEN: POSITIVE
DAT, IgG: POSITIVE

## 2014-06-01 MED ORDER — ZOLPIDEM TARTRATE 10 MG PO TABS
10.0000 mg | ORAL_TABLET | Freq: Every day | ORAL | Status: DC
  Administered 2014-06-01: 10 mg via ORAL
  Filled 2014-06-01: qty 1

## 2014-06-01 NOTE — Progress Notes (Signed)
Physical Therapy Treatment Patient Details Name: Debra Griffith MRN: [id number] DOB: 1941/07/24 Today's Date: 06-15-14    History of Present Illness RTKA    PT Comments    POD # 1 pm session.  Assisted pt OOB to amb again.  Required increased time and + 2 for safety.  Pt progressing slowly and plans to D/C to SNF for ST Rehab.  Follow Up Recommendations  SNF ([facility])     Equipment Recommendations  None recommended by PT    Recommendations for Other Services       Precautions / Restrictions Precautions Precautions: Knee Precaution Comments: Instructed pt on KI use for amb Knee Immobilizer - Right: Discontinue once straight leg raise with < 10 degree lag Restrictions Weight Bearing Restrictions: No Other Position/Activity Restrictions: WBAT    Mobility  Bed Mobility Overal bed mobility: Needs Assistance Bed Mobility: Supine to Sit     Supine to sit: Mod assist     General bed mobility comments: support R LE and increased time  Transfers Overall transfer level: Needs assistance Equipment used: Rolling walker (2 wheeled) Transfers: Sit to/from Stand Sit to Stand: Mod assist;+2 safety/equipment;From elevated surface         General transfer comment: cues for safety and hand placement plus increased time esp with turn completion  Ambulation/Gait   Ambulation Distance (Feet): 55 Feet Assistive device: Rolling walker (2 wheeled) Gait Pattern/deviations: Step-to pattern;Decreased stance time - right Gait velocity: decreased   General Gait Details: cues for posture and sequence.   Stairs            Wheelchair Mobility    Modified Rankin (Stroke Patients Only)       Balance                                    Cognition                            Exercises   Total Knee Replacement TE's 10 reps B LE ankle pumps 10 reps towel squeezes 10 reps knee presses 10 reps heel slides  10 reps SAQ's 10 reps SLR's 10  reps ABD Followed by ICE'   General Comments        Pertinent Vitals/Pain Pain Assessment: 0-10 Pain Score: 6  Pain Location: R knee Pain Descriptors / Indicators: Aching Pain Intervention(s): Repositioned;Ice applied    Home Living                      Prior Function            PT Goals (current goals can now be found in the care plan section) Progress towards PT goals: Progressing toward goals    Frequency  7X/week    PT Plan      Co-evaluation             End of Session Equipment Utilized During Treatment: Gait belt Activity Tolerance: Patient tolerated treatment well Patient left: in chair;with call bell/phone within reach     Time: 7673-4193 PT Time Calculation (min): 16 min  Charges:  $Gait Training: 8-22 mins                     G Codes:      Debra Griffith Jun 15, 2014, 4:00 PM

## 2014-06-01 NOTE — Progress Notes (Signed)
OT Cancellation Note  Patient Details Name: Debra Griffith MRN: [id number] DOB: 01-16-1941   Cancelled Treatment:    Reason Eval/Treat Not Completed: Other (comment) Pt is Medicare/Medicaid and current D/C plan is SNF. No apparent immediate acute care OT needs, therefore will defer OT to SNF. If OT eval is needed please call Acute Rehab Dept. at [place] 06/01/2014, 7:20 AM Lesle Chris, OTR/L [phone] 06/01/2014

## 2014-06-01 NOTE — Progress Notes (Signed)
Physical Therapy Treatment Patient Details Name: Debra Griffith MRN: [id number] DOB: 1940-12-07 Today's Date: 06/01/2014    History of Present Illness RTKA    PT Comments    POD ! Called to room by NT to assist getting pt OOB to [facility] with + 2 assist.  Then assisted transfering from [facility] to recliner.  Pain meds just now die and was unable to attempt further activity due to pain level.  Applied ICE and will return after pain meds given.  Follow Up Recommendations  SNF ([place])     Equipment Recommendations  None recommended by PT    Recommendations for Other Services       Precautions / Restrictions Precautions Precautions: Knee Precaution Comments: Instructed pt on KI use for amb Knee Immobilizer - Right: Discontinue once straight leg raise with < 10 degree lag Restrictions Weight Bearing Restrictions: No Other Position/Activity Restrictions: WBAT    Mobility  Bed Mobility Overal bed mobility: Needs Assistance Bed Mobility: Supine to Sit     Supine to sit: Mod assist     General bed mobility comments: support R LE and increased time  Transfers Overall transfer level: Needs assistance Equipment used: Rolling walker (2 wheeled) Transfers: Sit to/from Stand Sit to Stand: Mod assist;+2 safety/equipment;From elevated surface         General transfer comment: cues for safety and hand placement plus increased time esp with turn completion  Ambulation/Gait   Unable to attempt due to pain level Stairs            Wheelchair Mobility    Modified Rankin (Stroke Patients Only)       Balance                                    Cognition                            Exercises      General Comments        Pertinent Vitals/Pain Pain Assessment: 0-10 Pain Score: 10/10  Pain Location: R knee Pain Descriptors / Indicators: Aching Pain Intervention(s): Repositioned;Ice applied    Home Living                       Prior Function            PT Goals (current goals can now be found in the care plan section) Progress towards PT goals: Progressing toward goals    Frequency  7X/week    PT Plan      Co-evaluation             End of Session Equipment Utilized During Treatment: Gait belt Activity Tolerance: Patient tolerated treatment well Patient left: in chair;with call bell/phone within reach     Time: 1047-1058 PT Time Calculation (min): 11 min  Charges:   $Therapeutic Activity: 8-22 mins                    G Codes:      Rica Koyanagi  PTA [facility]  Acute  Rehab Pager      [phone]

## 2014-06-01 NOTE — Progress Notes (Signed)
   Subjective: 1 Day Post-Op Procedure(s) (LRB): RIGHT TOTAL KNEE ARTHROPLASTY (Right) Patient reports pain as mild and moderate.   Patient seen in rounds with Dr. Wynelle Link. Patient is well, but has had some minor complaints of pain in the knee, requiring pain medications and not much sleep. We will start therapy today.  Plan is to go to [facility] after hospital stay.  Objective: Vital signs in last 24 hours: Temp:  [97.4 F (36.3 C)-99.1 F (37.3 C)] 98.2 F (36.8 C) (09/15 0656) Pulse Rate:  [61-100] 77 (09/15 0656) Resp:  [11-18] 18 (09/15 0800) BP: (98-134)/(45-80) 132/56 mmHg (09/15 0656) SpO2:  [91 %-100 %] 100 % (09/15 0800)  Intake/Output from previous day:  Intake/Output Summary (Last 24 hours) at 06/01/14 0817 Last data filed at 06/01/14 0658  Gross per 24 hour  Intake 4288.75 ml  Output   4175 ml  Net 113.75 ml    Intake/Output this shift:    Labs:  Recent Labs  06/01/14 0426  HGB 10.4*    Recent Labs  06/01/14 0426  WBC 10.0  RBC 3.54*  HCT 31.0*  PLT 258    Recent Labs  06/01/14 0426  NA 135*  K 4.3  CL 99  CO2 26  BUN 19  CREATININE 0.94  GLUCOSE 150*  CALCIUM 9.1   No results found for this basename: LABPT, INR,  in the last 72 hours  EXAM General - Patient is Alert, Appropriate and Oriented Extremity - Neurovascular intact Sensation intact distally Dressing - dressing C/D/I Motor Function - intact, moving foot and toes well on exam.  Hemovac pulled without difficulty.  Past Medical History  Diagnosis Date  . Complication of anesthesia     severe diarrhea and vomiting after hysterectomy   . Anxiety   . Anemia     hx of   . Obesity   . Insomnia   . Depression   . H/O bronchitis     FLARE UPS USUALLY ONCE A YEAR  . Vitamin D deficiency   . Hypertension   . GERD (gastroesophageal reflux disease)   . PONV (postoperative nausea and vomiting)     SEVERE N&V AND DIARRHEA AFTER HYSTERECTOMY AND AFTER WISDOM TEETH  EXTRACTIONS - NO PROBLEMS WITH LAST 2 SURGERIES - THE HIP AND LEFT KNEE  . Dysrhythmia     OVER 10 YRS AGO - SAW CARDIOLOGIST FOR IRREGULAR HEART BEAT - NO TREATMENT NEEDED AND HAS NOT HAD TO SEE CARDIOLOGIST SINCE.  Marland Kitchen Right bundle branch block   . Arthritis     OA  . DDD (degenerative disc disease), lumbar     Assessment/Plan: 1 Day Post-Op Procedure(s) (LRB): RIGHT TOTAL KNEE ARTHROPLASTY (Right) Active Problems:   OA (osteoarthritis) of knee  Estimated body mass index is 44.43 kg/(m^2) as calculated from the following:   Height as of this encounter: 5\' 2"  (1.575 m).   Weight as of this encounter: 110.224 kg (243 lb). Advance diet Up with therapy Discharge to SNF - [name]  DVT Prophylaxis - Xarelto Weight-Bearing as tolerated to right leg D/C O2 and Pulse OX and try on Room Air  Arlee Muslim, PA-C Orthopaedic Surgery 06/01/2014, 8:17 AM

## 2014-06-01 NOTE — Progress Notes (Signed)
Physical Therapy Treatment Patient Details Name: Debra Griffith MRN: [id number] DOB: September 11, 1941 Today's Date: 06/01/2014    History of Present Illness RTKA    PT Comments    POD # 1 am session.  Assisted out of recliner to [facility] then with amb limited distance in hallway.  Pt requires increased time and 25% VC's on safety and proper walker to self distance.  Returned to room to perform TKR TE's followed by ICE.  Follow Up Recommendations  SNF ([facility])     Equipment Recommendations  None recommended by PT    Recommendations for Other Services       Precautions / Restrictions Precautions Precautions: Knee Precaution Comments: Instructed pt on KI use for amb Knee Immobilizer - Right: Discontinue once straight leg raise with < 10 degree lag Restrictions Weight Bearing Restrictions: No Other Position/Activity Restrictions: WBAT    Mobility  Bed Mobility Overal bed mobility: Needs Assistance Bed Mobility: Supine to Sit     Supine to sit: Mod assist     General bed mobility comments: support R LE and increased time  Transfers Overall transfer level: Needs assistance Equipment used: Rolling walker (2 wheeled) Transfers: Sit to/from Stand Sit to Stand: Mod assist;+2 safety/equipment;From elevated surface         General transfer comment: cues for safety and hand placement plus increased time esp with turn completion  Ambulation/Gait   Ambulation Distance (Feet): 25 Feet Assistive device: Rolling walker (2 wheeled) Gait Pattern/deviations: Step-to pattern;Decreased stance time - right Gait velocity: decreased   General Gait Details: cues for posture and sequence.   Stairs            Wheelchair Mobility    Modified Rankin (Stroke Patients Only)       Balance                                    Cognition                            Exercises      General Comments        Pertinent Vitals/Pain      Home Living                       Prior Function            PT Goals (current goals can now be found in the care plan section) Progress towards PT goals: Progressing toward goals    Frequency  7X/week    PT Plan      Co-evaluation             End of Session Equipment Utilized During Treatment: Gait belt Activity Tolerance: Patient tolerated treatment well Patient left: in chair;with call bell/phone within reach     Time: 1208-1232 PT Time Calculation (min): 24 min  Charges:  $Gait Training: 8-22 mins $Therapeutic Exercise: 8-22 mins                    G Codes:      Rica Koyanagi  PTA [facility]  Acute  Rehab Pager      [phone]

## 2014-06-01 NOTE — Discharge Instructions (Addendum)
° °Dr. Frank Aluisio °Total Joint Specialist °[facility] °[address] °[place], [place] [address] °[phone] ° °TOTAL KNEE REPLACEMENT POSTOPERATIVE DIRECTIONS ° ° ° °Knee Rehabilitation, Guidelines Following Surgery  °Results after knee surgery are often greatly improved when you follow the exercise, range of motion and muscle strengthening exercises prescribed by your doctor. Safety measures are also important to protect the knee from further injury. Any time any of these exercises cause you to have increased pain or swelling in your knee joint, decrease the amount until you are comfortable again and slowly increase them. If you have problems or questions, call your caregiver or physical therapist for advice.  ° °HOME CARE INSTRUCTIONS  °Remove items at home which could result in a fall. This includes throw rugs or furniture in walking pathways.  °Continue medications as instructed at time of discharge. °You may have some home medications which will be placed on hold until you complete the course of blood thinner medication.  °You may start showering once you are discharged home but do not submerge the incision under water. Just pat the incision dry and apply a dry gauze dressing on daily. °Walk with walker as instructed.  °You may resume a sexual relationship in one month or when given the OK by  your doctor.  °· Use walker as long as suggested by your caregivers. °· Avoid periods of inactivity such as sitting longer than an hour when not asleep. This helps prevent blood clots.  °You may put full weight on your legs and walk as much as is comfortable.  °You may return to work once you are cleared by your doctor.  °Do not drive a car for 6 weeks or until released by you surgeon.  °· Do not drive while taking narcotics.  °Wear the elastic stockings for three weeks following surgery during the day but you may remove then at night. °Make sure you keep all of your appointments after your  operation with all of your doctors and caregivers. You should call the office at the above phone number and make an appointment for approximately two weeks after the date of your surgery. °Change the dressing daily and reapply a dry dressing each time. °Please pick up a stool softener and laxative for home use as long as you are requiring pain medications. °· Continue to use ice on the knee for pain and swelling from surgery. You may notice swelling that will progress down to the foot and ankle.  This is normal after surgery.  Elevate the leg when you are not up walking on it.   °It is important for you to complete the blood thinner medication as prescribed by your doctor. °· Continue to use the breathing machine which will help keep your temperature down.  It is common for your temperature to cycle up and down following surgery, especially at night when you are not up moving around and exerting yourself.  The breathing machine keeps your lungs expanded and your temperature down. ° °RANGE OF MOTION AND STRENGTHENING EXERCISES  °Rehabilitation of the knee is important following a knee injury or an operation. After just a few days of immobilization, the muscles of the thigh which control the knee become weakened and shrink (atrophy). Knee exercises are designed to build up the tone and strength of the thigh muscles and to improve knee motion. Often times heat used for twenty to thirty minutes before working out will loosen up your tissues and help with improving the   range of motion but do not use heat for the first two weeks following surgery. These exercises can be done on a training (exercise) mat, on the floor, on a table or on a bed. Use what ever works the best and is most comfortable for you Knee exercises include:  Leg Lifts - While your knee is still immobilized in a splint or cast, you can do straight leg raises. Lift the leg to 60 degrees, hold for 3 sec, and slowly lower the leg. Repeat 10-20 times 2-3  times daily. Perform this exercise against resistance later as your knee gets better.  Quad and Hamstring Sets - Tighten up the muscle on the front of the thigh (Quad) and hold for 5-10 sec. Repeat this 10-20 times hourly. Hamstring sets are done by pushing the foot backward against an object and holding for 5-10 sec. Repeat as with quad sets.  A rehabilitation program following serious knee injuries can speed recovery and prevent re-injury in the future due to weakened muscles. Contact your doctor or a physical therapist for more information on knee rehabilitation.   SKILLED REHAB INSTRUCTIONS: If the patient is transferred to a skilled rehab facility following release from the hospital, a list of the current medications will be sent to the facility for the patient to continue.  When discharged from the skilled rehab facility, please have the facility set up the patient's [place] prior to being released. Also, the skilled facility will be responsible for providing the patient with their medications at time of release from the facility to include their pain medication, the muscle relaxants, and their blood thinner medication. If the patient is still at the rehab facility at time of the two week follow up appointment, the skilled rehab facility will also need to assist the patient in arranging follow up appointment in our office and any transportation needs.  MAKE SURE YOU:  Understand these instructions.  Will watch your condition.  Will get help right away if you are not doing well or get worse.    Pick up stool softner and laxative for home. Do not submerge incision under water. May shower. Continue to use ice for pain and swelling from surgery.  Take Xarelto for two and a half more weeks, then discontinue Xarelto. Once the patient has completed the blood thinner regimen, then take a Baby 81 mg Aspirin daily for three more weeks.  When discharged from the skilled rehab  facility, please have the facility set up the patient's [place] prior to being released.  Also provide the patient with their medications at time of release from the facility to include their pain medication, the muscle relaxants, and their blood thinner medication.  If the patient is still at the rehab facility at time of follow up appointment, please also assist the patient in arranging follow up appointment in our office and any transportation needs.   Information on my medicine - XARELTO (Rivaroxaban)  This medication education was reviewed with me or my healthcare representative as part of my discharge preparation.  The pharmacist that spoke with me during my hospital stay was:  Julio Sicks, [facility]  Why was Xarelto prescribed for you? Xarelto was prescribed for you to reduce the risk of blood clots forming after orthopedic surgery. The medical term for these abnormal blood clots is venous thromboembolism (VTE).  What do you need to know about xarelto ? Take your Xarelto ONCE DAILY at the same time every day.  You may take it either with or without food.  If you have difficulty swallowing the tablet whole, you may crush it and mix in applesauce just prior to taking your dose.  Take Xarelto exactly as prescribed by your doctor and DO NOT stop taking Xarelto without talking to the doctor who prescribed the medication.  Stopping without other VTE prevention medication to take the place of Xarelto may increase your risk of developing a clot.  After discharge, you should have regular check-up appointments with your healthcare provider that is prescribing your Xarelto.    What do you do if you miss a dose? If you miss a dose, take it as soon as you remember on the same day then continue your regularly scheduled once daily regimen the next day. Do not take two doses of Xarelto on the same day.   Important Safety Information A possible side effect of Xarelto is  bleeding. You should call your healthcare provider right away if you experience any of the following:   Bleeding from an injury or your nose that does not stop.   Unusual colored urine (red or dark brown) or unusual colored stools (red or black).   Unusual bruising for unknown reasons.   A serious fall or if you hit your head (even if there is no bleeding).  Some medicines may interact with Xarelto and might increase your risk of bleeding while on Xarelto. To help avoid this, consult your healthcare provider or pharmacist prior to using any new prescription or non-prescription medications, including herbals, vitamins, non-steroidal anti-inflammatory drugs (NSAIDs) and supplements.  This website has more information on Xarelto: [email or web address]

## 2014-06-02 DIAGNOSIS — R262 Difficulty in walking, not elsewhere classified: Secondary | ICD-10-CM | POA: Diagnosis not present

## 2014-06-02 DIAGNOSIS — R279 Unspecified lack of coordination: Secondary | ICD-10-CM | POA: Diagnosis not present

## 2014-06-02 DIAGNOSIS — S8990XA Unspecified injury of unspecified lower leg, initial encounter: Secondary | ICD-10-CM | POA: Diagnosis not present

## 2014-06-02 DIAGNOSIS — Z471 Aftercare following joint replacement surgery: Secondary | ICD-10-CM | POA: Diagnosis not present

## 2014-06-02 DIAGNOSIS — M25569 Pain in unspecified knee: Secondary | ICD-10-CM | POA: Diagnosis not present

## 2014-06-02 DIAGNOSIS — M6281 Muscle weakness (generalized): Secondary | ICD-10-CM | POA: Diagnosis not present

## 2014-06-02 DIAGNOSIS — S99929A Unspecified injury of unspecified foot, initial encounter: Secondary | ICD-10-CM | POA: Diagnosis not present

## 2014-06-02 DIAGNOSIS — I10 Essential (primary) hypertension: Secondary | ICD-10-CM | POA: Diagnosis not present

## 2014-06-02 DIAGNOSIS — D62 Acute posthemorrhagic anemia: Secondary | ICD-10-CM | POA: Diagnosis not present

## 2014-06-02 DIAGNOSIS — K219 Gastro-esophageal reflux disease without esophagitis: Secondary | ICD-10-CM | POA: Diagnosis not present

## 2014-06-02 DIAGNOSIS — F329 Major depressive disorder, single episode, unspecified: Secondary | ICD-10-CM | POA: Diagnosis not present

## 2014-06-02 DIAGNOSIS — Z96659 Presence of unspecified artificial knee joint: Secondary | ICD-10-CM | POA: Diagnosis not present

## 2014-06-02 DIAGNOSIS — F3289 Other specified depressive episodes: Secondary | ICD-10-CM | POA: Diagnosis not present

## 2014-06-02 DIAGNOSIS — F411 Generalized anxiety disorder: Secondary | ICD-10-CM | POA: Diagnosis not present

## 2014-06-02 DIAGNOSIS — M1710 Unilateral primary osteoarthritis, unspecified knee: Secondary | ICD-10-CM | POA: Diagnosis not present

## 2014-06-02 DIAGNOSIS — K5909 Other constipation: Secondary | ICD-10-CM | POA: Diagnosis not present

## 2014-06-02 LAB — CBC
HCT: 30.5 % — ABNORMAL LOW (ref 36.0–46.0)
Hemoglobin: 10 g/dL — ABNORMAL LOW (ref 12.0–15.0)
MCH: 29.6 pg (ref 26.0–34.0)
MCHC: 32.8 g/dL (ref 30.0–36.0)
MCV: 90.2 fL (ref 78.0–100.0)
PLATELETS: 245 10*3/uL (ref 150–400)
RBC: 3.38 MIL/uL — ABNORMAL LOW (ref 3.87–5.11)
RDW: 13.7 % (ref 11.5–15.5)
WBC: 9.6 10*3/uL (ref 4.0–10.5)

## 2014-06-02 LAB — BASIC METABOLIC PANEL
ANION GAP: 12 (ref 5–15)
BUN: 19 mg/dL (ref 6–23)
CALCIUM: 9.2 mg/dL (ref 8.4–10.5)
CO2: 26 mEq/L (ref 19–32)
Chloride: 100 mEq/L (ref 96–112)
Creatinine, Ser: 0.75 mg/dL (ref 0.50–1.10)
GFR calc Af Amer: >90 mL/min (ref >90)
GFR, EST NON AFRICAN AMERICAN: 83 mL/min — AB (ref >90)
Glucose, Bld: 117 mg/dL — ABNORMAL HIGH (ref 70–99)
Potassium: 4.2 mEq/L (ref 3.7–5.3)
SODIUM: 138 meq/L (ref 137–147)

## 2014-06-02 MED ORDER — OXYCODONE HCL 5 MG PO TABS: 5.0000 mg | ORAL_TABLET | ORAL | Status: DC | PRN

## 2014-06-02 MED ORDER — METHOCARBAMOL 500 MG PO TABS: 500.0000 mg | ORAL_TABLET | Freq: Four times a day (QID) | ORAL | Status: DC | PRN

## 2014-06-02 MED ORDER — ACETAMINOPHEN 325 MG PO TABS: 650.0000 mg | ORAL_TABLET | Freq: Four times a day (QID) | ORAL | Status: DC | PRN

## 2014-06-02 MED ORDER — BISACODYL 10 MG RE SUPP: 10.0000 mg | Freq: Every day | RECTAL | Status: DC | PRN

## 2014-06-02 MED ORDER — ONDANSETRON HCL 4 MG PO TABS: 4.0000 mg | ORAL_TABLET | Freq: Four times a day (QID) | ORAL | Status: DC | PRN

## 2014-06-02 MED ORDER — CYCLOBENZAPRINE HCL 10 MG PO TABS: 20.0000 mg | ORAL_TABLET | Freq: Every day | ORAL | Status: DC

## 2014-06-02 MED ORDER — METOCLOPRAMIDE HCL 5 MG PO TABS: 5.0000 mg | ORAL_TABLET | Freq: Three times a day (TID) | ORAL | Status: DC | PRN

## 2014-06-02 MED ORDER — DSS 100 MG PO CAPS: 100.0000 mg | ORAL_CAPSULE | Freq: Two times a day (BID) | ORAL | Status: DC

## 2014-06-02 MED ORDER — POLYETHYLENE GLYCOL 3350 17 G PO PACK: 17.0000 g | PACK | Freq: Every day | ORAL | Status: DC | PRN

## 2014-06-02 MED ORDER — RIVAROXABAN 10 MG PO TABS: 10.0000 mg | ORAL_TABLET | Freq: Every day | ORAL | Status: DC

## 2014-06-02 NOTE — Plan of Care (Signed)
Problem: Phase III Progression Outcomes Goal: Anticoagulant follow-up in place Outcome: Not Applicable Date Met:  23/36/12 Xarelto VTE, no f/u needed.

## 2014-06-02 NOTE — Care Management Note (Signed)
    Page 1 of 1   06/02/2014     10:27:50 AM CARE MANAGEMENT NOTE 06/02/2014  Patient:  Debra Griffith, Debra Griffith   Account Number:  [id number]  Date Initiated:  06/02/2014  Documentation initiated by:  [facility]  Subjective/Objective Assessment:   adm: RIGHT TOTAL KNEE ARTHROPLASTY (Right)     Action/Plan:   SNF   Anticipated DC Date:  06/02/2014   Anticipated DC Plan:  [place]  CM consult      Choice offered to / List presented to:             Status of service:  Completed, signed off Medicare Important Message given?   (If response is "NO", the following Medicare IM given date fields will be blank) Date Medicare IM given:   Medicare IM given by:   Date Additional Medicare IM given:   Additional Medicare IM given by:    Discharge Disposition:  [place]  Per UR Regulation:    If discussed at Long Length of Stay Meetings, dates discussed:    Comments:  06/02/14 10:20 CM notes pt to go to SNF for rehab; CSW arranging.  No other CM needs were communicated.  Mariane Masters, BSN, CM [phone].

## 2014-06-02 NOTE — Progress Notes (Signed)
   Subjective: 2 Days Post-Op Procedure(s) (LRB): RIGHT TOTAL KNEE ARTHROPLASTY (Right) Patient reports pain as mild.   Patient seen in rounds with Dr. Wynelle Link. Patient is well, and has had no acute complaints or problems Patient is ready to go to [facility]  Objective: Vital signs in last 24 hours: Temp:  [97.7 F (36.5 C)-98.9 F (37.2 C)] 97.7 F (36.5 C) (09/16 0455) Pulse Rate:  [77-85] 77 (09/16 0455) Resp:  [16-18] 18 (09/16 0455) BP: (123-138)/(50-81) 124/81 mmHg (09/16 0455) SpO2:  [96 %-100 %] 100 % (09/16 0455)  Intake/Output from previous day:  Intake/Output Summary (Last 24 hours) at 06/02/14 0732 Last data filed at 06/02/14 0430  Gross per 24 hour  Intake 1666.34 ml  Output   2450 ml  Net -783.66 ml    Intake/Output this shift:    Labs:  Recent Labs  06/01/14 0426 06/02/14 0445  HGB 10.4* 10.0*    Recent Labs  06/01/14 0426 06/02/14 0445  WBC 10.0 9.6  RBC 3.54* 3.38*  HCT 31.0* 30.5*  PLT 258 245    Recent Labs  06/01/14 0426 06/02/14 0445  NA 135* 138  K 4.3 4.2  CL 99 100  CO2 26 26  BUN 19 19  CREATININE 0.94 0.75  GLUCOSE 150* 117*  CALCIUM 9.1 9.2   No results found for this basename: LABPT, INR,  in the last 72 hours  EXAM: General - Patient is Alert, Appropriate and Oriented Extremity - Neurovascular intact Sensation intact distally Dorsiflexion/Plantar flexion intact Incision - clean, dry, no drainage Motor Function - intact, moving foot and toes well on exam.   Assessment/Plan: 2 Days Post-Op Procedure(s) (LRB): RIGHT TOTAL KNEE ARTHROPLASTY (Right) Procedure(s) (LRB): RIGHT TOTAL KNEE ARTHROPLASTY (Right) Past Medical History  Diagnosis Date  . Complication of anesthesia     severe diarrhea and vomiting after hysterectomy   . Anxiety   . Anemia     hx of   . Obesity   . Insomnia   . Depression   . H/O bronchitis     FLARE UPS USUALLY ONCE A YEAR  . Vitamin D deficiency   . Hypertension   . GERD  (gastroesophageal reflux disease)   . PONV (postoperative nausea and vomiting)     SEVERE N&V AND DIARRHEA AFTER HYSTERECTOMY AND AFTER WISDOM TEETH EXTRACTIONS - NO PROBLEMS WITH LAST 2 SURGERIES - THE HIP AND LEFT KNEE  . Dysrhythmia     OVER 10 YRS AGO - SAW CARDIOLOGIST FOR IRREGULAR HEART BEAT - NO TREATMENT NEEDED AND HAS NOT HAD TO SEE CARDIOLOGIST SINCE.  Marland Kitchen Right bundle branch block   . Arthritis     OA  . DDD (degenerative disc disease), lumbar    Active Problems:   OA (osteoarthritis) of knee  Estimated body mass index is 44.43 kg/(m^2) as calculated from the following:   Height as of this encounter: 5\' 2"  (1.575 m).   Weight as of this encounter: 110.224 kg (243 lb). Up with therapy Discharge to SNF Diet - Cardiac diet Follow up - in 9 days Activity - WBAT Disposition - Skilled nursing facility Condition Upon Discharge - Good D/C Meds - See DC Summary DVT Prophylaxis - [place], PA-C Orthopaedic Surgery 06/02/2014, 7:32 AM

## 2014-06-02 NOTE — Progress Notes (Signed)
Physical Therapy Treatment Patient Details Name: Debra Griffith MRN: [id number] DOB: July 02, 1941 Today's Date: 06/02/2014    History of Present Illness RTKA    PT Comments    POD # 2 am session.  Assisted OOB to amb in hallway.  Assisted to bathroom.  Performed all supine TKR TE's then applied ICE.    Follow Up Recommendations   SNF      Equipment Recommendations       Recommendations for Other Services       Precautions / Restrictions Precautions Precautions: Knee Precaution Comments: Instructed pt on KI use for amb Required Braces or Orthoses: Knee Immobilizer - Right Knee Immobilizer - Right: Discontinue once straight leg raise with < 10 degree lag Restrictions Weight Bearing Restrictions: No Other Position/Activity Restrictions: WBAT    Mobility  Bed Mobility Overal bed mobility: Needs Assistance Bed Mobility: Supine to Sit     Supine to sit: Min assist     General bed mobility comments: support R LE and increased time  Transfers Overall transfer level: Needs assistance Equipment used: Rolling walker (2 wheeled) Transfers: Sit to/from Stand Sit to Stand: Min assist;Mod assist         General transfer comment: cues for safety and hand placement plus increased time esp with turn completion  Ambulation/Gait Ambulation/Gait assistance: Min assist Ambulation Distance (Feet): 32 Feet Assistive device: Rolling walker (2 wheeled) Gait Pattern/deviations: Step-to pattern;Decreased stance time - right Gait velocity: decreased   General Gait Details: cues for posture and sequence.   Stairs            Wheelchair Mobility    Modified Rankin (Stroke Patients Only)       Balance                                    Cognition                            Exercises   Total Knee Replacement TE's 10 reps B LE ankle pumps 10 reps towel squeezes 10 reps knee presses 10 reps heel slides  10 reps SAQ's 10 reps SLR's 10  reps ABD Followed by ICE     General Comments        Pertinent Vitals/Pain Pain Assessment: 0-10 Pain Score: 6  Pain Location: R knee Pain Descriptors / Indicators: Aching Pain Intervention(s): Repositioned;Premedicated before session;Ice applied    Home Living                      Prior Function            PT Goals (current goals can now be found in the care plan section)      Frequency       PT Plan      Co-evaluation             End of Session           Time: 1062-6948 PT Time Calculation (min): 40 min  Charges:  $Gait Training: 8-22 mins $Therapeutic Exercise: 8-22 mins $Therapeutic Activity: 8-22 mins                    G Codes:      Rica Koyanagi  PTA [facility]  Acute  Rehab Pager      [phone]

## 2014-06-02 NOTE — Progress Notes (Signed)
Patient to be discharged to [facility].  Report called.  Will continue to monitor until ambulance service arrives to transport patient.  Christen Bame RN

## 2014-06-02 NOTE — Discharge Summary (Signed)
Physician Discharge Summary   Patient ID: Debra Griffith MRN: [id number] DOB/AGE: 72-Jan-1942 72 y.o.  Admit date: 05/31/2014 Discharge date: 06/02/2014  Primary Diagnosis:  Osteoarthritis Right knee(s)  Admission Diagnoses:  Past Medical History  Diagnosis Date  . Complication of anesthesia     severe diarrhea and vomiting after hysterectomy   . Anxiety   . Anemia     hx of   . Obesity   . Insomnia   . Depression   . H/O bronchitis     FLARE UPS USUALLY ONCE A YEAR  . Vitamin D deficiency   . Hypertension   . GERD (gastroesophageal reflux disease)   . PONV (postoperative nausea and vomiting)     SEVERE N&V AND DIARRHEA AFTER HYSTERECTOMY AND AFTER WISDOM TEETH EXTRACTIONS - NO PROBLEMS WITH LAST 2 SURGERIES - THE HIP AND LEFT KNEE  . Dysrhythmia     OVER 10 YRS AGO - SAW CARDIOLOGIST FOR IRREGULAR HEART BEAT - NO TREATMENT NEEDED AND HAS NOT HAD TO SEE CARDIOLOGIST SINCE.  Marland Kitchen Right bundle branch block   . Arthritis     OA  . DDD (degenerative disc disease), lumbar    Discharge Diagnoses:   Active Problems:   OA (osteoarthritis) of knee  Estimated body mass index is 44.43 kg/(m^2) as calculated from the following:   Height as of this encounter: 5' 2"  (1.575 m).   Weight as of this encounter: 110.224 kg (243 lb).  Procedure:  Procedure(s) (LRB): RIGHT TOTAL KNEE ARTHROPLASTY (Right)   Consults: None  HPI: Debra Griffith is a 73 y.o. year old female with end stage OA of her right knee with progressively worsening pain and dysfunction. She has constant pain, with activity and at rest and significant functional deficits with difficulties even with ADLs. She has had extensive non-op management including analgesics, injections of cortisone and viscosupplements, and home exercise program, but remains in significant pain with significant dysfunction.Radiographs show bone on bone arthritis medial and patellofemoral. She presents now for right Total Knee Arthroplasty.    Laboratory Data: Admission on 05/31/2014  Component Date Value Ref Range Status  . WBC 06/01/2014 10.0  4.0 - 10.5 K/uL Final  . RBC 06/01/2014 3.54* 3.87 - 5.11 MIL/uL Final  . Hemoglobin 06/01/2014 10.4* 12.0 - 15.0 g/dL Final  . HCT 06/01/2014 31.0* 36.0 - 46.0 % Final  . MCV 06/01/2014 87.6  78.0 - 100.0 fL Final  . MCH 06/01/2014 29.4  26.0 - 34.0 pg Final  . MCHC 06/01/2014 33.5  30.0 - 36.0 g/dL Final  . RDW 06/01/2014 13.3  11.5 - 15.5 % Final  . Platelets 06/01/2014 258  150 - 400 K/uL Final  . Sodium 06/01/2014 135* 137 - 147 mEq/L Final  . Potassium 06/01/2014 4.3  3.7 - 5.3 mEq/L Final  . Chloride 06/01/2014 99  96 - 112 mEq/L Final  . CO2 06/01/2014 26  19 - 32 mEq/L Final  . Glucose, Bld 06/01/2014 150* 70 - 99 mg/dL Final  . BUN 06/01/2014 19  6 - 23 mg/dL Final  . Creatinine, Ser 06/01/2014 0.94  0.50 - 1.10 mg/dL Final  . Calcium 06/01/2014 9.1  8.4 - 10.5 mg/dL Final  . GFR calc non Af Amer 06/01/2014 59* >90 mL/min Final  . GFR calc Af Amer 06/01/2014 69* >90 mL/min Final   Comment: (NOTE)  The eGFR has been calculated using the CKD EPI equation.                          This calculation has not been validated in all clinical situations.                          eGFR's persistently <90 mL/min signify possible Chronic Kidney                          Disease.  . Anion gap 06/01/2014 10  5 - 15 Final  . ABO/RH(D) 05/31/2014 A POS   Final  . Antibody Screen 05/31/2014 POS   Final  . Sample Expiration 05/31/2014 06/03/2014   Final  . DAT, IgG 05/31/2014 POS   Final  . WBC 06/02/2014 9.6  4.0 - 10.5 K/uL Final  . RBC 06/02/2014 3.38* 3.87 - 5.11 MIL/uL Final  . Hemoglobin 06/02/2014 10.0* 12.0 - 15.0 g/dL Final  . HCT 06/02/2014 30.5* 36.0 - 46.0 % Final  . MCV 06/02/2014 90.2  78.0 - 100.0 fL Final  . MCH 06/02/2014 29.6  26.0 - 34.0 pg Final  . MCHC 06/02/2014 32.8  30.0 - 36.0 g/dL Final  . RDW 06/02/2014 13.7  11.5 - 15.5 % Final  .  Platelets 06/02/2014 245  150 - 400 K/uL Final  . Sodium 06/02/2014 138  137 - 147 mEq/L Final  . Potassium 06/02/2014 4.2  3.7 - 5.3 mEq/L Final  . Chloride 06/02/2014 100  96 - 112 mEq/L Final  . CO2 06/02/2014 26  19 - 32 mEq/L Final  . Glucose, Bld 06/02/2014 117* 70 - 99 mg/dL Final  . BUN 06/02/2014 19  6 - 23 mg/dL Final  . Creatinine, Ser 06/02/2014 0.75  0.50 - 1.10 mg/dL Final  . Calcium 06/02/2014 9.2  8.4 - 10.5 mg/dL Final  . GFR calc non Af Amer 06/02/2014 83* >90 mL/min Final  . GFR calc Af Amer 06/02/2014 >90  >90 mL/min Final   Comment: (NOTE)                          The eGFR has been calculated using the CKD EPI equation.                          This calculation has not been validated in all clinical situations.                          eGFR's persistently <90 mL/min signify possible Chronic Kidney                          Disease.  Georgiann Hahn gap 06/02/2014 12  5 - 15 Final  Hospital Outpatient Visit on 05/26/2014  Component Date Value Ref Range Status  . MRSA, PCR 05/26/2014 NEGATIVE  NEGATIVE Final  . Staphylococcus aureus 05/26/2014 POSITIVE* NEGATIVE Final   Comment:                                 The Xpert SA Assay (FDA  approved for NASAL specimens                          in patients over 45 years of age),                          is one component of                          a comprehensive surveillance                          program.  Test performance has                          been validated by [employer] for patients greater                          than or equal to 79 year old.                          It is not intended                          to diagnose infection nor to                          guide or monitor treatment.  Marland Kitchen aPTT 05/26/2014 26  24 - 37 seconds Final  . WBC 05/26/2014 5.2  4.0 - 10.5 K/uL Final  . RBC 05/26/2014 4.06  3.87 - 5.11 MIL/uL Final  . Hemoglobin 05/26/2014 11.9* 12.0  - 15.0 g/dL Final  . HCT 05/26/2014 36.1  36.0 - 46.0 % Final  . MCV 05/26/2014 88.9  78.0 - 100.0 fL Final  . MCH 05/26/2014 29.3  26.0 - 34.0 pg Final  . MCHC 05/26/2014 33.0  30.0 - 36.0 g/dL Final  . RDW 05/26/2014 13.6  11.5 - 15.5 % Final  . Platelets 05/26/2014 289  150 - 400 K/uL Final  . Sodium 05/26/2014 135* 137 - 147 mEq/L Final  . Potassium 05/26/2014 3.8  3.7 - 5.3 mEq/L Final  . Chloride 05/26/2014 97  96 - 112 mEq/L Final  . CO2 05/26/2014 26  19 - 32 mEq/L Final  . Glucose, Bld 05/26/2014 100* 70 - 99 mg/dL Final  . BUN 05/26/2014 27* 6 - 23 mg/dL Final  . Creatinine, Ser 05/26/2014 1.01  0.50 - 1.10 mg/dL Final  . Calcium 05/26/2014 9.7  8.4 - 10.5 mg/dL Final  . Total Protein 05/26/2014 7.0  6.0 - 8.3 g/dL Final  . Albumin 05/26/2014 3.8  3.5 - 5.2 g/dL Final  . AST 05/26/2014 16  0 - 37 U/L Final  . ALT 05/26/2014 15  0 - 35 U/L Final  . Alkaline Phosphatase 05/26/2014 88  39 - 117 U/L Final  . Total Bilirubin 05/26/2014 0.3  0.3 - 1.2 mg/dL Final  . GFR calc non Af Amer 05/26/2014 54* >90 mL/min Final  . GFR calc Af Amer 05/26/2014 63* >90 mL/min Final   Comment: (NOTE)  The eGFR has been calculated using the CKD EPI equation.                          This calculation has not been validated in all clinical situations.                          eGFR's persistently <90 mL/min signify possible Chronic Kidney                          Disease.  . Anion gap 05/26/2014 12  5 - 15 Final  . Prothrombin Time 05/26/2014 13.5  11.6 - 15.2 seconds Final  . INR 05/26/2014 1.03  0.00 - 1.49 Final  . ABO/RH(D) 05/26/2014 A POS   Final  . Antibody Screen 05/26/2014 POS   Final  . Sample Expiration 05/26/2014 05/30/2014   Final  . Antibody Identification 66/29/4765 WARM AUTOANTIBODY   Final  . DAT, IgG 05/26/2014 POS   Final  . Antibody ID,T [place] 05/26/2014 NO SPECIFIC ANTIBODY DEMONSTRATED IN [place]   Final  . Unit Number 05/26/2014 [id number]    Final  . Blood Component Type 05/26/2014 RED CELLS,LR   Final  . Unit division 05/26/2014 00   Final  . Status of Unit 05/26/2014 REL FROM [facility]   Final  . Transfusion Status 05/26/2014 OK TO TRANSFUSE   Final  . Crossmatch Result 05/26/2014 COMPATIBLE   Final  . Unit Number 05/26/2014 [id number]   Final  . Blood Component Type 05/26/2014 RED CELLS,LR   Final  . Unit division 05/26/2014 00   Final  . Status of Unit 05/26/2014 REL FROM [facility]   Final  . Transfusion Status 05/26/2014 DO NOT ISSUE FOR TRANSFUSION   Final  . Crossmatch Result 05/26/2014 INCOMPATIBLE   Final  . Color, Urine 05/26/2014 YELLOW  YELLOW Final  . APPearance 05/26/2014 CLEAR  CLEAR Final  . Specific Gravity, Urine 05/26/2014 1.014  1.005 - 1.030 Final  . pH 05/26/2014 6.5  5.0 - 8.0 Final  . Glucose, UA 05/26/2014 NEGATIVE  NEGATIVE mg/dL Final  . Hgb urine dipstick 05/26/2014 NEGATIVE  NEGATIVE Final  . Bilirubin Urine 05/26/2014 NEGATIVE  NEGATIVE Final  . Ketones, ur 05/26/2014 NEGATIVE  NEGATIVE mg/dL Final  . Protein, ur 05/26/2014 NEGATIVE  NEGATIVE mg/dL Final  . Urobilinogen, UA 05/26/2014 0.2  0.0 - 1.0 mg/dL Final  . Nitrite 05/26/2014 POSITIVE* NEGATIVE Final  . Leukocytes, UA 05/26/2014 NEGATIVE  NEGATIVE Final  . Squamous Epithelial / LPF 05/26/2014 FEW* RARE Final  . WBC, UA 05/26/2014 7-10  <3 WBC/hpf Final  . Bacteria, UA 05/26/2014 MANY* RARE Final  . Urine-Other 05/26/2014 MUCOUS PRESENT   Final     X-Rays:No results found.  EKG: Orders placed during the hospital encounter of 11/02/13  . EKG     Hospital Course: Debra Griffith is a 73 y.o. who was admitted to [facility]. They were brought to the operating room on 05/31/2014 and underwent Procedure(s): RIGHT TOTAL KNEE ARTHROPLASTY.  Patient tolerated the procedure well and was later transferred to the recovery room and then to the orthopaedic floor for postoperative care.  They were given PO and IV analgesics for pain control  following their surgery.  They were given 24 hours of postoperative antibiotics of  Anti-infectives   Start     Dose/Rate Route Frequency Ordered Stop   05/31/14 1430  ceFAZolin (  ANCEF) IVPB 2 g/50 mL premix     2 g 100 mL/hr over 30 Minutes Intravenous Every 6 hours 05/31/14 1148 05/31/14 2035   05/31/14 0634  ceFAZolin (ANCEF) IVPB 2 g/50 mL premix     2 g 100 mL/hr over 30 Minutes Intravenous On call to O.R. 05/31/14 4008 05/31/14 0842     and started on DVT prophylaxis in the form of Xarelto.   PT and OT were ordered for total joint protocol.  Discharge planning consulted to help with postop disposition and equipment needs.  Patient had a tough night on the evening of surgery with pain but was better some the next morning.  They started to get up OOB with therapy on day one. Hemovac drain was pulled without difficulty.  Continued to work with therapy into day two.  Dressing was changed on day two and the incision was healing well.  Patient was seen in rounds and was ready to go to [facility] on POD 2.  Discharge to SNF  Diet - Cardiac diet  Follow up - in 9 days  Activity - WBAT  Disposition - Skilled nursing facility - [name] Condition Upon Discharge - Good  D/C Meds - See DC Summary  DVT Prophylaxis - Xarelto    Discharge Instructions   Call MD / Call 911    Complete by:  As directed   If you experience chest pain or shortness of breath, CALL 911 and be transported to the hospital emergency room.  If you develope a fever above 101 F, pus (white drainage) or increased drainage or redness at the wound, or calf pain, call your surgeon's office.     Change dressing    Complete by:  As directed   Change dressing daily with sterile 4 x 4 inch gauze dressing and apply TED hose. Do not submerge the incision under water.     Constipation Prevention    Complete by:  As directed   Drink plenty of fluids.  Prune juice may be helpful.  You may use a stool softener, such as Colace  (over the counter) 100 mg twice a day.  Use MiraLax (over the counter) for constipation as needed.     Diet - low sodium heart healthy    Complete by:  As directed      Discharge instructions    Complete by:  As directed   Pick up stool softner and laxative for home. Do not submerge incision under water. May shower. Continue to use ice for pain and swelling from surgery.  Take Xarelto for two and a half more weeks, then discontinue Xarelto. Once the patient has completed the blood thinner regimen, then take a Baby 81 mg Aspirin daily for three more weeks.  When discharged from the skilled rehab facility, please have the facility set up the patient's [place] prior to being released.  Also provide the patient with their medications at time of release from the facility to include their pain medication, the muscle relaxants, and their blood thinner medication.  If the patient is still at the rehab facility at time of follow up appointment, please also assist the patient in arranging follow up appointment in our office and any transportation needs.     Do not put a pillow under the knee. Place it under the heel.    Complete by:  As directed      Do not sit on low chairs, stoools or toilet seats, as it  may be difficult to get up from low surfaces    Complete by:  As directed      Driving restrictions    Complete by:  As directed   No driving until released by the physician.     Increase activity slowly as tolerated    Complete by:  As directed      Lifting restrictions    Complete by:  As directed   No lifting until released by the physician.     Patient may shower    Complete by:  As directed   You may shower without a dressing once there is no drainage.  Do not wash over the wound.  If drainage remains, do not shower until drainage stops.     TED hose    Complete by:  As directed   Use stockings (TED hose) for 3 weeks on both leg(s).  You may remove them at night for  sleeping.     Weight bearing as tolerated    Complete by:  As directed   Laterality:  right  Extremity:  Lower            Medication List    STOP taking these medications       aspirin EC 81 MG tablet     HYDROcodone-acetaminophen 10-325 MG per tablet  Commonly known as:  NORCO     naproxen sodium 220 MG tablet  Commonly known as:  ANAPROX     OVER THE COUNTER MEDICATION     Vitamin D-3 5000 UNITS Tabs      TAKE these medications       acetaminophen 325 MG tablet  Commonly known as:  TYLENOL  Take 2 tablets (650 mg total) by mouth every 6 (six) hours as needed for mild pain (or Fever >/= 101).     albuterol 108 (90 BASE) MCG/ACT inhaler  Commonly known as:  PROVENTIL HFA;VENTOLIN HFA  Inhale 2 puffs into the lungs every 6 (six) hours as needed. Wheezing and shortness of breath     amLODipine 10 MG tablet  Commonly known as:  NORVASC  Take 10 mg by mouth every morning.     bisacodyl 10 MG suppository  Commonly known as:  DULCOLAX  Place 1 suppository (10 mg total) rectally daily as needed for moderate constipation.     buPROPion 150 MG 12 hr tablet  Commonly known as:  WELLBUTRIN SR  Take 150 mg by mouth every morning.     clonazePAM 1 MG tablet  Commonly known as:  KLONOPIN  Take 1 mg by mouth at bedtime.     cyclobenzaprine 10 MG tablet  Commonly known as:  FLEXERIL  Take 2 tablets (20 mg total) by mouth at bedtime.     DSS 100 MG Caps  Take 100 mg by mouth 2 (two) times daily.     ferrous sulfate 325 (65 FE) MG tablet  Take 325 mg by mouth daily with breakfast. For anemia     methocarbamol 500 MG tablet  Commonly known as:  ROBAXIN  Take 1 tablet (500 mg total) by mouth every 6 (six) hours as needed for muscle spasms.     metoCLOPramide 5 MG tablet  Commonly known as:  REGLAN  Take 1-2 tablets (5-10 mg total) by mouth every 8 (eight) hours as needed for nausea (if ondansetron (ZOFRAN) ineffective.).     metoprolol tartrate 25 MG tablet    Commonly known as:  LOPRESSOR  Take 25 mg by mouth  2 (two) times daily.     ondansetron 4 MG tablet  Commonly known as:  ZOFRAN  Take 1 tablet (4 mg total) by mouth every 6 (six) hours as needed for nausea.     oxyCODONE 5 MG immediate release tablet  Commonly known as:  Oxy IR/ROXICODONE  Take 1-2 tablets (5-10 mg total) by mouth every 3 (three) hours as needed for moderate pain, severe pain or breakthrough pain.     polyethylene glycol packet  Commonly known as:  MIRALAX / GLYCOLAX  Take 17 g by mouth daily as needed for mild constipation.     potassium chloride SA 20 MEQ tablet  Commonly known as:  K-DUR,KLOR-CON  Take 20 mEq by mouth 2 (two) times daily. For potassium supplement.     ranitidine 150 MG tablet  Commonly known as:  ZANTAC  Take 300 mg by mouth 2 (two) times daily.     rivaroxaban 10 MG Tabs tablet  Commonly known as:  XARELTO  - Take 1 tablet (10 mg total) by mouth daily with breakfast. Take Xarelto for two and a half more weeks, then discontinue Xarelto.  - Once the patient has completed the blood thinner regimen, then take a Baby 81 mg Aspirin daily for three more weeks.     triamterene-hydrochlorothiazide 37.5-25 MG per tablet  Commonly known as:  MAXZIDE-25  Take 1 tablet by mouth daily before breakfast.     valsartan 320 MG tablet  Commonly known as:  DIOVAN  Take 320 mg by mouth every morning.     zolpidem 10 MG tablet  Commonly known as:  AMBIEN  Take 10 mg by mouth at bedtime.           Follow-up Information   Follow up with [name], MD. Schedule an appointment as soon as possible for a visit on 06/10/2014. (Call office at [phone] to set up appointment next Thursday 06/10/2014 for follow up.)    Specialty:  Orthopedic Surgery   Contact information:   [address] [place] [address] [phone]       Signed: Arlee Muslim, PA-C Orthopaedic Surgery 06/02/2014, 8:13 AM

## 2014-06-03 ENCOUNTER — Non-Acute Institutional Stay (SKILLED_NURSING_FACILITY): Payer: Medicare Other | Admitting: Adult Health

## 2014-06-03 ENCOUNTER — Encounter: Payer: Self-pay | Admitting: Adult Health

## 2014-06-03 DIAGNOSIS — F3289 Other specified depressive episodes: Secondary | ICD-10-CM

## 2014-06-03 DIAGNOSIS — F419 Anxiety disorder, unspecified: Secondary | ICD-10-CM

## 2014-06-03 DIAGNOSIS — F32A Depression, unspecified: Secondary | ICD-10-CM

## 2014-06-03 DIAGNOSIS — K219 Gastro-esophageal reflux disease without esophagitis: Secondary | ICD-10-CM

## 2014-06-03 DIAGNOSIS — G4700 Insomnia, unspecified: Secondary | ICD-10-CM

## 2014-06-03 DIAGNOSIS — F411 Generalized anxiety disorder: Secondary | ICD-10-CM

## 2014-06-03 DIAGNOSIS — Z96651 Presence of right artificial knee joint: Secondary | ICD-10-CM

## 2014-06-03 DIAGNOSIS — I10 Essential (primary) hypertension: Secondary | ICD-10-CM

## 2014-06-03 DIAGNOSIS — Z96659 Presence of unspecified artificial knee joint: Secondary | ICD-10-CM

## 2014-06-03 DIAGNOSIS — F329 Major depressive disorder, single episode, unspecified: Secondary | ICD-10-CM | POA: Diagnosis not present

## 2014-06-03 DIAGNOSIS — M1710 Unilateral primary osteoarthritis, unspecified knee: Secondary | ICD-10-CM

## 2014-06-03 DIAGNOSIS — D62 Acute posthemorrhagic anemia: Secondary | ICD-10-CM

## 2014-06-03 DIAGNOSIS — M1711 Unilateral primary osteoarthritis, right knee: Secondary | ICD-10-CM

## 2014-06-03 NOTE — Progress Notes (Signed)
Patient ID: JANEKA LIBMAN, female   DOB: 1941-03-11, 73 y.o.   MRN: [id number]              PROGRESS NOTE  DATE: 06/03/2014  FACILITY: Nursing Home Location: [facility]  LEVEL OF CARE: SNF (31)  Acute Visit  CHIEF COMPLAINT:  Follow-up Hospitalization  HISTORY OF PRESENT ILLNESS:  This is a 73 year old female who has been admitted to [facility] on 06/02/[facility] [address] with Osteoarthritis S/P right total knee arthroplasty. She has been admitted for a short-term rehabilitation.   REASSESSMENT OF ONGOING PROBLEM(S):  HTN: Pt 's HTN remains stable.  Denies CP, sob, DOE, pedal edema, headaches, dizziness or visual disturbances.  No complications from the medications currently being used.  Last BP : 113/63  DEPRESSION: The depression remains stable. Patient denies ongoing feelings of sadness, insomnia, anedhonia or lack of appetite. No complications reported from the medications currently being used. Staff do not report behavioral problems.  ANEMIA: The anemia has been stable. The patient denies fatigue, melena or hematochezia. No complications from the medications currently being used. 9/15 hgb 10.0  PAST MEDICAL HISTORY : Reviewed.  No changes/see problem list  CURRENT MEDICATIONS: Reviewed per MAR/see medication list  REVIEW OF SYSTEMS:  GENERAL: no change in appetite, no fatigue, no weight changes, no fever, chills or weakness RESPIRATORY: no cough, SOB, DOE, wheezing, hemoptysis CARDIAC: no chest pain, edema or palpitations GI: no abdominal pain, diarrhea, constipation, heart burn, nausea or vomiting  PHYSICAL EXAMINATION  GENERAL: no acute distress, obese EYES: conjunctivae normal, sclerae normal, normal eye lids NECK: supple, trachea midline, no neck masses, no thyroid tenderness, no thyromegaly LYMPHATICS: no LAN in the neck, no supraclavicular LAN RESPIRATORY: breathing is even & unlabored, BS CTAB CARDIAC: RRR, no murmur,no extra heart  sounds, no edema GI: abdomen soft, normal BS, no masses, no tenderness, no hepatomegaly, no splenomegaly PSYCHIATRIC: the patient is alert & oriented to person, affect & behavior appropriate  LABS/RADIOLOGY: Labs reviewed: Basic Metabolic Panel:  Recent Labs  05/26/14 1110 06/01/14 0426 06/02/14 0445  NA 135* 135* 138  K 3.8 4.3 4.2  CL 97 99 100  CO2 26 26 26   GLUCOSE 100* 150* 117*  BUN 27* 19 19  CREATININE 1.01 0.94 0.75  CALCIUM 9.7 9.1 9.2   Liver Function Tests:  Recent Labs  10/23/13 1218 10/23/13 1305 05/26/14 1110  AST 18 19 16   ALT 19 19 15   ALKPHOS 104 103 88  BILITOT 0.2* 0.2* 0.3  PROT 7.3 7.4 7.0  ALBUMIN 4.3 4.3 3.8   CBC:  Recent Labs  10/23/13 1218 10/23/13 1305  05/26/14 1110 06/01/14 0426 06/02/14 0445  WBC 7.0 6.7  < > 5.2 10.0 9.6  NEUTROABS  --  3.3  --   --   --   --   HGB 12.8 11.8*  < > 11.9* 10.4* 10.0*  HCT 38.6 36.5  < > 36.1 31.0* 30.5*  MCV 89.6 88.8  < > 88.9 87.6 90.2  PLT 304 293  < > 289 258 245  < > = values in this interval not displayed.  Cardiac Enzymes:  Recent Labs  10/23/13 1305  TROPONINI <0.30    EXAM: CHEST  2 VIEW   COMPARISON:  DG CHEST 2 VIEW dated 06/18/2012   FINDINGS: Cardiopericardial silhouette within normal limits. Mediastinal contours normal. Trachea midline. No airspace disease or effusion. Mild tortuosity of the thoracic aorta is unchanged.   IMPRESSION: No active  cardiopulmonary disease.    ASSESSMENT/PLAN:  Osteoarthritis S/P Right total knee arthroplasty - for rehabilitation Hypertension - well-controlled; continue Norvasc, Maxzide, Diovan and Lopressor; check BMP  Depression - continue Wellbutrin Anxiety - continue Klonopin Anemia, acute blood loss -  check CBC Insomnia - stable; continue Ambien GERD - stable; continue Zantac    CPT CODE: 28003  Seth Bake - NP [facility] [phone]

## 2014-06-04 NOTE — Progress Notes (Signed)
Clinical Social Work Department CLINICAL SOCIAL WORK PLACEMENT NOTE 06/04/2014  Patient:  Debra Griffith, Debra Griffith  Account Number:  [id number] Admit date:  05/31/2014  Clinical Social Worker:  Werner Lean, LCSW  Date/time:  05/31/2014 02:57 PM  Clinical Social Work is seeking post-discharge placement for this patient at the following level of care:   SKILLED NURSING   (*CSW will update this form in Epic as items are completed)     Patient/family provided with [employer] Department of Clinical Social Work's list of facilities offering this level of care within the geographic area requested by the patient (or if unable, by the patient's family).  05/31/2014  Patient/family informed of their freedom to choose among providers that offer the needed level of care, that participate in Medicare, Medicaid or managed care program needed by the patient, have an available bed and are willing to accept the patient.    Patient/family informed of [facility]' ownership interest in [facility], as well as of the fact that they are under no obligation to receive care at this facility.  PASARR submitted to EDS on 05/31/2014 PASARR number received on 05/31/2014  FL2 transmitted to all facilities in geographic area requested by pt/family on  05/31/2014 FL2 transmitted to all facilities within larger geographic area on   Patient informed that his/her managed care company has contracts with or will negotiate with  certain facilities, including the following:     Patient/family informed of bed offers received:  05/31/2014 Patient chooses bed at  Physician recommends and patient chooses bed at  [facility]  Patient to be transferred to [facility] on  06/02/2014 Patient to be transferred to facility by [facility] Patient and family notified of transfer on 06/02/2014 Name of family member notified:  SPOUSE  The following physician request were entered in Epic:   Additional Comments: Pt / spouse  are in agreement with d/c to SNF. [facility] transport required. Nsg reviewed d/c summary, scripts, avs. Scripts included in d/c packet. Werner Lean LCSW [phone]

## 2014-06-05 LAB — TYPE AND SCREEN
ABO/RH(D): A POS
Antibody Screen: POSITIVE
DAT, IgG: POSITIVE
Unit division: 0
Unit division: 0

## 2014-06-08 ENCOUNTER — Non-Acute Institutional Stay (SKILLED_NURSING_FACILITY): Payer: Medicare Other | Admitting: Internal Medicine

## 2014-06-08 DIAGNOSIS — K5909 Other constipation: Secondary | ICD-10-CM

## 2014-06-08 DIAGNOSIS — D62 Acute posthemorrhagic anemia: Secondary | ICD-10-CM | POA: Diagnosis not present

## 2014-06-08 DIAGNOSIS — I10 Essential (primary) hypertension: Secondary | ICD-10-CM

## 2014-06-08 DIAGNOSIS — M1711 Unilateral primary osteoarthritis, right knee: Secondary | ICD-10-CM

## 2014-06-08 DIAGNOSIS — M1710 Unilateral primary osteoarthritis, unspecified knee: Secondary | ICD-10-CM

## 2014-06-09 ENCOUNTER — Non-Acute Institutional Stay (SKILLED_NURSING_FACILITY): Payer: Medicare Other | Admitting: Adult Health

## 2014-06-09 DIAGNOSIS — F3289 Other specified depressive episodes: Secondary | ICD-10-CM

## 2014-06-09 DIAGNOSIS — I10 Essential (primary) hypertension: Secondary | ICD-10-CM

## 2014-06-09 DIAGNOSIS — Z96659 Presence of unspecified artificial knee joint: Secondary | ICD-10-CM | POA: Diagnosis not present

## 2014-06-09 DIAGNOSIS — M1711 Unilateral primary osteoarthritis, right knee: Secondary | ICD-10-CM

## 2014-06-09 DIAGNOSIS — F329 Major depressive disorder, single episode, unspecified: Secondary | ICD-10-CM

## 2014-06-09 DIAGNOSIS — G4700 Insomnia, unspecified: Secondary | ICD-10-CM

## 2014-06-09 DIAGNOSIS — Z96651 Presence of right artificial knee joint: Secondary | ICD-10-CM

## 2014-06-09 DIAGNOSIS — M1710 Unilateral primary osteoarthritis, unspecified knee: Secondary | ICD-10-CM

## 2014-06-09 DIAGNOSIS — F411 Generalized anxiety disorder: Secondary | ICD-10-CM

## 2014-06-09 DIAGNOSIS — F419 Anxiety disorder, unspecified: Secondary | ICD-10-CM

## 2014-06-09 DIAGNOSIS — F32A Depression, unspecified: Secondary | ICD-10-CM

## 2014-06-09 DIAGNOSIS — D62 Acute posthemorrhagic anemia: Secondary | ICD-10-CM | POA: Diagnosis not present

## 2014-06-09 DIAGNOSIS — K219 Gastro-esophageal reflux disease without esophagitis: Secondary | ICD-10-CM

## 2014-06-10 DIAGNOSIS — Z96642 Presence of left artificial hip joint: Secondary | ICD-10-CM | POA: Diagnosis not present

## 2014-06-10 DIAGNOSIS — Z96653 Presence of artificial knee joint, bilateral: Secondary | ICD-10-CM | POA: Diagnosis not present

## 2014-06-10 DIAGNOSIS — Z471 Aftercare following joint replacement surgery: Secondary | ICD-10-CM | POA: Diagnosis not present

## 2014-06-10 DIAGNOSIS — I10 Essential (primary) hypertension: Secondary | ICD-10-CM | POA: Diagnosis not present

## 2014-06-10 DIAGNOSIS — R269 Unspecified abnormalities of gait and mobility: Secondary | ICD-10-CM | POA: Diagnosis not present

## 2014-06-10 DIAGNOSIS — M1710 Unilateral primary osteoarthritis, unspecified knee: Secondary | ICD-10-CM | POA: Diagnosis not present

## 2014-06-10 DIAGNOSIS — E669 Obesity, unspecified: Secondary | ICD-10-CM | POA: Diagnosis not present

## 2014-06-10 NOTE — Progress Notes (Signed)
HISTORY & PHYSICAL  DATE: 06/08/2014   FACILITY: [facility]  LEVEL OF CARE: SNF (31)  ALLERGIES:  Allergies  Allergen Reactions  . Morphine And Related     hallucinations    CHIEF COMPLAINT:  Manage right knee osteoarthritis, acute blood loss anemia and hypertension  HISTORY OF PRESENT ILLNESS: Patient is a 73 year old Caucasian female.  KNEE OSTEOARTHRITIS: Patient had a history of pain and functional disability in the knee due to end-stage osteoarthritis and has failed nonsurgical conservative treatments. Patient had worsening of pain with activity and weight bearing, pain that interfered with activities of daily living & pain with passive range of motion. Therefore patient underwent total knee arthroplasty and tolerated the procedure well. Patient is admitted to this facility for sort short-term rehabilitation. Patient denies knee pain. Complains of right lower extremity swelling.  ANEMIA: The anemia has been stable. The patient denies fatigue, melena or hematochezia. No complications from the medications currently being used. Postoperatively the patient suffered an acute blood loss. She did not require constitution. On 06-04-14 hemoglobin 9.9.  HTN: Pt 's HTN remains stable.  Denies CP, sob, DOE, headaches, dizziness or visual disturbances.  No complications from the medications currently being used.   Last BP : 128/72  PAST MEDICAL HISTORY :  Past Medical History  Diagnosis Date  . Complication of anesthesia     severe diarrhea and vomiting after hysterectomy   . Anxiety   . Anemia     hx of   . Obesity   . Insomnia   . Depression   . H/O bronchitis     FLARE UPS USUALLY ONCE A YEAR  . Vitamin D deficiency   . Hypertension   . GERD (gastroesophageal reflux disease)   . PONV (postoperative nausea and vomiting)     SEVERE N&V AND DIARRHEA AFTER HYSTERECTOMY AND AFTER WISDOM TEETH EXTRACTIONS - NO PROBLEMS WITH LAST 2 SURGERIES - THE HIP AND  LEFT KNEE  . Dysrhythmia     OVER 10 YRS AGO - SAW CARDIOLOGIST FOR IRREGULAR HEART BEAT - NO TREATMENT NEEDED AND HAS NOT HAD TO SEE CARDIOLOGIST SINCE.  Marland Kitchen Right bundle branch block   . Arthritis     OA  . DDD (degenerative disc disease), lumbar     PAST SURGICAL HISTORY: Past Surgical History  Procedure Laterality Date  . Cesarean section      x 3  . Wisdom tooth extraction  1970's    admitted to hospital for surgery  . Abdominal hysterectomy    . Total hip arthroplasty  06/24/2012    Procedure: TOTAL HIP ARTHROPLASTY;  Surgeon: [name], MD;  Location: [facility];  Service: Orthopedics;  Laterality: Left;  . Total knee arthroplasty N/A 11/02/2013    Procedure: LEFT TOTAL KNEE ARTHROPLASTY WITH RIGHT KNEE CORTISONE INJECTION;  Surgeon: [name], MD;  Location: [facility];  Service: Orthopedics;  Laterality: N/A;  . Total knee arthroplasty Right 05/31/2014    Procedure: RIGHT TOTAL KNEE ARTHROPLASTY;  Surgeon: [name], MD;  Location: [facility];  Service: Orthopedics;  Laterality: Right;    SOCIAL HISTORY:  reports that she has never smoked. She has never used smokeless tobacco. She reports that she does not drink alcohol or use illicit drugs.  FAMILY HISTORY: None  CURRENT MEDICATIONS: Reviewed per MAR/see medication list  REVIEW OF SYSTEMS:  See HPI otherwise 14 point ROS is negative.  PHYSICAL EXAMINATION  VS:  See  VS section  GENERAL: no acute distress, morbidly obese body habitus EYES: conjunctivae normal, sclerae normal, normal eye lids MOUTH/THROAT: lips without lesions,no lesions in the mouth,tongue is without lesions,uvula elevates in midline NECK: supple, trachea midline, no neck masses, no thyroid tenderness, no thyromegaly LYMPHATICS: no LAN in the neck, no supraclavicular LAN RESPIRATORY: breathing is even & unlabored, BS CTAB CARDIAC: RRR, no murmur,no extra heart sounds, +2 right lower extremity edema GI:  ABDOMEN: abdomen soft, normal BS, no  masses, no tenderness  LIVER/SPLEEN: no hepatomegaly, no splenomegaly MUSCULOSKELETAL: HEAD: normal to inspection  EXTREMITIES: LEFT UPPER EXTREMITY: full range of motion, normal strength & tone RIGHT UPPER EXTREMITY:  full range of motion, normal strength & tone LEFT LOWER EXTREMITY: Moderate range of motion, normal strength & tone RIGHT LOWER EXTREMITY:  range of motion not tested due to surgery, normal strength & tone PSYCHIATRIC: the patient is alert & oriented to person, affect & behavior appropriate  LABS/RADIOLOGY:  06-04-14 hemoglobin 9.9, MCV 94.7 otherwise CBC normal, glucose 123, BUN 26 otherwise BMP normal  Labs reviewed: Basic Metabolic Panel:  Recent Labs  05/26/14 1110 06/01/14 0426 06/02/14 0445  NA 135* 135* 138  K 3.8 4.3 4.2  CL 97 99 100  CO2 26 26 26   GLUCOSE 100* 150* 117*  BUN 27* 19 19  CREATININE 1.01 0.94 0.75  CALCIUM 9.7 9.1 9.2   Liver Function Tests:  Recent Labs  10/23/13 1218 10/23/13 1305 05/26/14 1110  AST 18 19 16   ALT 19 19 15   ALKPHOS 104 103 88  BILITOT 0.2* 0.2* 0.3  PROT 7.3 7.4 7.0  ALBUMIN 4.3 4.3 3.8   CBC:  Recent Labs  10/23/13 1218 10/23/13 1305  05/26/14 1110 06/01/14 0426 06/02/14 0445  WBC 7.0 6.7  < > 5.2 10.0 9.6  NEUTROABS  --  3.3  --   --   --   --   HGB 12.8 11.8*  < > 11.9* 10.4* 10.0*  HCT 38.6 36.5  < > 36.1 31.0* 30.5*  MCV 89.6 88.8  < > 88.9 87.6 90.2  PLT 304 293  < > 289 258 245  < > = values in this interval not displayed.  Cardiac Enzymes:  Recent Labs  10/23/13 1305  TROPONINI <0.30    ASSESSMENT/PLAN:  Right knee osteoarthritis-status post total knee arthroplasty. Continue rehabilitation. Acute blood loss anemia-stable. Continue iron. Hypertension-well-controlled Constipation-well-controlled. hypokalemia-continue supplementation Insomnia-continue Ambien  I have reviewed patient's medical records received at admission/from hospitalization.  CPT CODE: 74827  Gayani Y  Dasan[place]aka, [place] [phone]

## 2014-06-11 ENCOUNTER — Encounter: Payer: Self-pay | Admitting: Adult Health

## 2014-06-11 DIAGNOSIS — E669 Obesity, unspecified: Secondary | ICD-10-CM | POA: Diagnosis not present

## 2014-06-11 DIAGNOSIS — Z471 Aftercare following joint replacement surgery: Secondary | ICD-10-CM | POA: Diagnosis not present

## 2014-06-11 DIAGNOSIS — Z96642 Presence of left artificial hip joint: Secondary | ICD-10-CM | POA: Diagnosis not present

## 2014-06-11 DIAGNOSIS — Z96653 Presence of artificial knee joint, bilateral: Secondary | ICD-10-CM | POA: Diagnosis not present

## 2014-06-11 DIAGNOSIS — R269 Unspecified abnormalities of gait and mobility: Secondary | ICD-10-CM | POA: Diagnosis not present

## 2014-06-11 DIAGNOSIS — I10 Essential (primary) hypertension: Secondary | ICD-10-CM | POA: Diagnosis not present

## 2014-06-11 NOTE — Progress Notes (Signed)
Patient ID: Debra Griffith, female   DOB: 31-May-1941, 73 y.o.   MRN: [id number]              PROGRESS NOTE  DATE:       06/09/14  FACILITY: Nursing Home Location: [facility]  LEVEL OF CARE: SNF (31)  Discharge Notes  HISTORY OF PRESENT ILLNESS:  This is a 73 year old female who is for discharge home with home health PT. She has been admitted to [facility] on 06/02/14 from [facility] with Osteoarthritis S/P right total knee arthroplasty. Patient was admitted to this facility for short-term rehabilitation after the patient's recent hospitalization.  Patient has completed SNF rehabilitation and therapy has cleared the patient for discharge.  REASSESSMENT OF ONGOING PROBLEM(S):  HTN: Pt 's HTN remains stable.  Denies CP, sob, DOE, pedal edema, headaches, dizziness or visual disturbances.  No complications from the medications currently being used.  Last BP : 128/72  GERD: pt's GERD is stable.  Denies ongoing heartburn, abd. Pain, nausea or vomiting.  Currently on a PPI & tolerates it without any adverse reactions.  ANXIETY: The anxiety remains stable. Patient denies ongoing anxiety or irritability. No complications reported from the medications currently being used.  PAST MEDICAL HISTORY : Reviewed.  No changes/see problem list  CURRENT MEDICATIONS: Reviewed per MAR/see medication list  REVIEW OF SYSTEMS:  GENERAL: no change in appetite, no fatigue, no weight changes, no fever, chills or weakness RESPIRATORY: no cough, SOB, DOE, wheezing, hemoptysis CARDIAC: no chest pain, edema or palpitations GI: no abdominal pain, diarrhea, constipation, heart burn, nausea or vomiting  PHYSICAL EXAMINATION  GENERAL: no acute distress, obese NECK: supple, trachea midline, no neck masses, no thyroid tenderness, no thyromegaly LYMPHATICS: no LAN in the neck, no supraclavicular LAN RESPIRATORY: breathing is even & unlabored, BS CTAB CARDIAC: RRR, no murmur,no extra heart  sounds, no edema GI: abdomen soft, normal BS, no masses, no tenderness, no hepatomegaly, no splenomegaly EXTREMITIES:  Able to move all 4 extremities PSYCHIATRIC: the patient is alert & oriented to person, affect & behavior appropriate  LABS/RADIOLOGY: 06/04/14  WBC 6.6 hemoglobin 9.9 hematocrit 32 point MCV 94.7 sodium 137 potassium 3.6  glucose 123 BUN 26 creatinine 0.8 calcium 8.5 Labs reviewed: Basic Metabolic Panel:  Recent Labs  05/26/14 1110 06/01/14 0426 06/02/14 0445  NA 135* 135* 138  K 3.8 4.3 4.2  CL 97 99 100  CO2 26 26 26   GLUCOSE 100* 150* 117*  BUN 27* 19 19  CREATININE 1.01 0.94 0.75  CALCIUM 9.7 9.1 9.2   Liver Function Tests:  Recent Labs  10/23/13 1218 10/23/13 1305 05/26/14 1110  AST 18 19 16   ALT 19 19 15   ALKPHOS 104 103 88  BILITOT 0.2* 0.2* 0.3  PROT 7.3 7.4 7.0  ALBUMIN 4.3 4.3 3.8   CBC:  Recent Labs  10/23/13 1218 10/23/13 1305  05/26/14 1110 06/01/14 0426 06/02/14 0445  WBC 7.0 6.7  < > 5.2 10.0 9.6  NEUTROABS  --  3.3  --   --   --   --   HGB 12.8 11.8*  < > 11.9* 10.4* 10.0*  HCT 38.6 36.5  < > 36.1 31.0* 30.5*  MCV 89.6 88.8  < > 88.9 87.6 90.2  PLT 304 293  < > 289 258 245  < > = values in this interval not displayed.  Cardiac Enzymes:  Recent Labs  10/23/13 1305  TROPONINI <0.30    EXAM: CHEST  2  VIEW   COMPARISON:  DG CHEST 2 VIEW dated 06/18/2012   FINDINGS: Cardiopericardial silhouette within normal limits. Mediastinal contours normal. Trachea midline. No airspace disease or effusion. Mild tortuosity of the thoracic aorta is unchanged.   IMPRESSION: No active cardiopulmonary disease.    ASSESSMENT/PLAN:  Osteoarthritis S/P Right total knee arthroplasty - for home health PT Hypertension - well-controlled; continue Norvasc, Maxzide, Diovan and Lopressor Depression - continue Wellbutrin Anxiety - continue Klonopin Anemia, acute blood loss -  stable Insomnia - stable; continue Ambien GERD - stable;  continue Zantac   I have filled out patient's discharge paperwork and written prescriptions.  Patient will receive home health PT.  Total discharge time: Less than 30 minutes  Discharge time involved coordination of the discharge process with Education officer, museum, nursing staff and therapy department. Medical justification for home health services verified.   CPT CODE: 86754   Seth Bake - NP [facility] [phone]

## 2014-06-14 DIAGNOSIS — I10 Essential (primary) hypertension: Secondary | ICD-10-CM | POA: Diagnosis not present

## 2014-06-14 DIAGNOSIS — R269 Unspecified abnormalities of gait and mobility: Secondary | ICD-10-CM | POA: Diagnosis not present

## 2014-06-14 DIAGNOSIS — E669 Obesity, unspecified: Secondary | ICD-10-CM | POA: Diagnosis not present

## 2014-06-14 DIAGNOSIS — Z471 Aftercare following joint replacement surgery: Secondary | ICD-10-CM | POA: Diagnosis not present

## 2014-06-14 DIAGNOSIS — Z96653 Presence of artificial knee joint, bilateral: Secondary | ICD-10-CM | POA: Diagnosis not present

## 2014-06-14 DIAGNOSIS — Z96642 Presence of left artificial hip joint: Secondary | ICD-10-CM | POA: Diagnosis not present

## 2014-06-16 DIAGNOSIS — I10 Essential (primary) hypertension: Secondary | ICD-10-CM | POA: Diagnosis not present

## 2014-06-16 DIAGNOSIS — Z471 Aftercare following joint replacement surgery: Secondary | ICD-10-CM | POA: Diagnosis not present

## 2014-06-16 DIAGNOSIS — Z96653 Presence of artificial knee joint, bilateral: Secondary | ICD-10-CM | POA: Diagnosis not present

## 2014-06-16 DIAGNOSIS — E669 Obesity, unspecified: Secondary | ICD-10-CM | POA: Diagnosis not present

## 2014-06-16 DIAGNOSIS — Z96642 Presence of left artificial hip joint: Secondary | ICD-10-CM | POA: Diagnosis not present

## 2014-06-16 DIAGNOSIS — R269 Unspecified abnormalities of gait and mobility: Secondary | ICD-10-CM | POA: Diagnosis not present

## 2014-06-18 DIAGNOSIS — Z471 Aftercare following joint replacement surgery: Secondary | ICD-10-CM | POA: Diagnosis not present

## 2014-06-18 DIAGNOSIS — Z96653 Presence of artificial knee joint, bilateral: Secondary | ICD-10-CM | POA: Diagnosis not present

## 2014-06-18 DIAGNOSIS — E669 Obesity, unspecified: Secondary | ICD-10-CM | POA: Diagnosis not present

## 2014-06-18 DIAGNOSIS — Z96642 Presence of left artificial hip joint: Secondary | ICD-10-CM | POA: Diagnosis not present

## 2014-06-18 DIAGNOSIS — I10 Essential (primary) hypertension: Secondary | ICD-10-CM | POA: Diagnosis not present

## 2014-06-18 DIAGNOSIS — R269 Unspecified abnormalities of gait and mobility: Secondary | ICD-10-CM | POA: Diagnosis not present

## 2014-06-21 DIAGNOSIS — M1711 Unilateral primary osteoarthritis, right knee: Secondary | ICD-10-CM | POA: Diagnosis not present

## 2014-06-24 DIAGNOSIS — M25562 Pain in left knee: Secondary | ICD-10-CM | POA: Diagnosis not present

## 2014-06-29 DIAGNOSIS — Z96651 Presence of right artificial knee joint: Secondary | ICD-10-CM | POA: Diagnosis not present

## 2014-06-29 DIAGNOSIS — Z471 Aftercare following joint replacement surgery: Secondary | ICD-10-CM | POA: Diagnosis not present

## 2014-06-29 DIAGNOSIS — M25562 Pain in left knee: Secondary | ICD-10-CM | POA: Diagnosis not present

## 2014-07-01 DIAGNOSIS — M25562 Pain in left knee: Secondary | ICD-10-CM | POA: Diagnosis not present

## 2014-07-06 DIAGNOSIS — M25562 Pain in left knee: Secondary | ICD-10-CM | POA: Diagnosis not present

## 2014-07-08 DIAGNOSIS — M25562 Pain in left knee: Secondary | ICD-10-CM | POA: Diagnosis not present

## 2014-07-13 DIAGNOSIS — M1711 Unilateral primary osteoarthritis, right knee: Secondary | ICD-10-CM | POA: Diagnosis not present

## 2014-07-15 DIAGNOSIS — M1711 Unilateral primary osteoarthritis, right knee: Secondary | ICD-10-CM | POA: Diagnosis not present

## 2014-07-21 DIAGNOSIS — M1711 Unilateral primary osteoarthritis, right knee: Secondary | ICD-10-CM | POA: Diagnosis not present

## 2014-07-23 DIAGNOSIS — M1711 Unilateral primary osteoarthritis, right knee: Secondary | ICD-10-CM | POA: Diagnosis not present

## 2014-07-27 DIAGNOSIS — M1711 Unilateral primary osteoarthritis, right knee: Secondary | ICD-10-CM | POA: Diagnosis not present

## 2014-07-29 DIAGNOSIS — M1711 Unilateral primary osteoarthritis, right knee: Secondary | ICD-10-CM | POA: Diagnosis not present

## 2014-08-07 ENCOUNTER — Other Ambulatory Visit: Payer: Self-pay | Admitting: Adult Health

## 2014-08-26 IMAGING — CR DG PORTABLE PELVIS
1 series · 1 of 1 positions shown · non-contrast
Comparison: 06/18/2012

CLINICAL DATA: Postop left hip replacement

PORTABLE PELVIS

[AP]
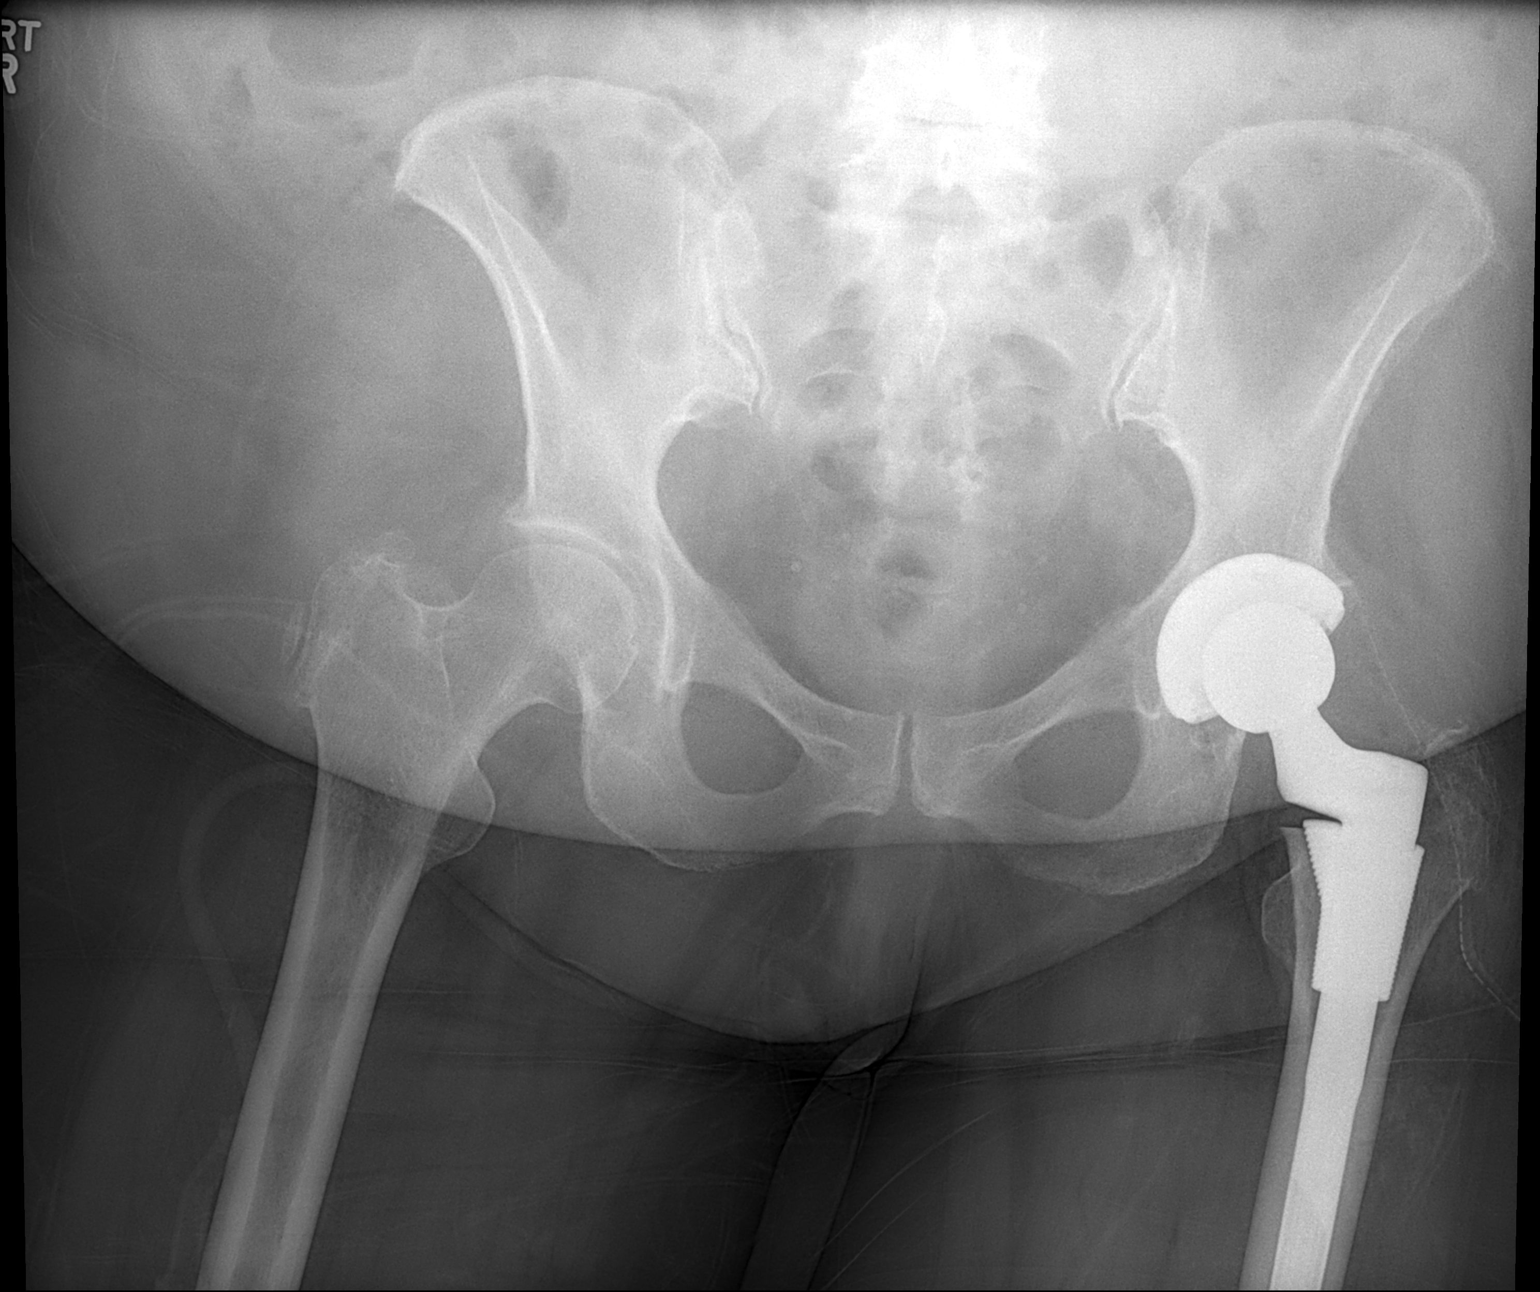

[1 of 1 positions shown; findings below may reference images not displayed]

FINDINGS: Total left hip arthroplasty in satisfactory position and
alignment.  No fracture or complication.
IMPRESSION: Satisfactory left hip replacement without acute abnormality.

## 2014-08-30 DIAGNOSIS — M47816 Spondylosis without myelopathy or radiculopathy, lumbar region: Secondary | ICD-10-CM | POA: Diagnosis not present

## 2014-08-30 DIAGNOSIS — Z79891 Long term (current) use of opiate analgesic: Secondary | ICD-10-CM | POA: Diagnosis not present

## 2014-09-22 DIAGNOSIS — M19041 Primary osteoarthritis, right hand: Secondary | ICD-10-CM | POA: Diagnosis not present

## 2014-10-07 ENCOUNTER — Other Ambulatory Visit: Payer: Self-pay | Admitting: Adult Health

## 2014-11-05 DIAGNOSIS — Z79899 Other long term (current) drug therapy: Secondary | ICD-10-CM | POA: Diagnosis not present

## 2014-11-05 DIAGNOSIS — M2550 Pain in unspecified joint: Secondary | ICD-10-CM | POA: Diagnosis not present

## 2014-11-10 DIAGNOSIS — M79643 Pain in unspecified hand: Secondary | ICD-10-CM | POA: Diagnosis not present

## 2014-11-10 DIAGNOSIS — M1990 Unspecified osteoarthritis, unspecified site: Secondary | ICD-10-CM | POA: Diagnosis not present

## 2014-11-10 DIAGNOSIS — E790 Hyperuricemia without signs of inflammatory arthritis and tophaceous disease: Secondary | ICD-10-CM | POA: Diagnosis not present

## 2014-11-10 DIAGNOSIS — G5601 Carpal tunnel syndrome, right upper limb: Secondary | ICD-10-CM | POA: Diagnosis not present

## 2014-11-17 DIAGNOSIS — Z23 Encounter for immunization: Secondary | ICD-10-CM | POA: Diagnosis not present

## 2014-11-24 DIAGNOSIS — M47816 Spondylosis without myelopathy or radiculopathy, lumbar region: Secondary | ICD-10-CM | POA: Diagnosis not present

## 2014-12-02 DIAGNOSIS — H3531 Nonexudative age-related macular degeneration: Secondary | ICD-10-CM | POA: Diagnosis not present

## 2014-12-02 DIAGNOSIS — H2513 Age-related nuclear cataract, bilateral: Secondary | ICD-10-CM | POA: Diagnosis not present

## 2015-01-17 ENCOUNTER — Other Ambulatory Visit (HOSPITAL_COMMUNITY): Payer: Self-pay | Admitting: Internal Medicine

## 2015-01-17 DIAGNOSIS — Z1231 Encounter for screening mammogram for malignant neoplasm of breast: Secondary | ICD-10-CM

## 2015-01-18 ENCOUNTER — Ambulatory Visit (HOSPITAL_COMMUNITY)
Admission: RE | Admit: 2015-01-18 | Discharge: 2015-01-18 | Disposition: A | Discharge status: Home / Self Care | Payer: Medicare Other | Source: Ambulatory Visit | Attending: Internal Medicine | Admitting: Internal Medicine

## 2015-01-18 DIAGNOSIS — Z1231 Encounter for screening mammogram for malignant neoplasm of breast: Secondary | ICD-10-CM

## 2015-01-20 ENCOUNTER — Ambulatory Visit (HOSPITAL_COMMUNITY): Payer: Medicare Other

## 2015-01-25 ENCOUNTER — Ambulatory Visit (HOSPITAL_COMMUNITY): Payer: Medicare Other | Attending: Internal Medicine

## 2015-02-01 ENCOUNTER — Ambulatory Visit (HOSPITAL_COMMUNITY)
Admission: RE | Admit: 2015-02-01 | Discharge: 2015-02-01 | Disposition: A | Discharge status: Home / Self Care | Payer: Medicare Other | Source: Ambulatory Visit | Attending: Internal Medicine | Admitting: Internal Medicine

## 2015-02-01 DIAGNOSIS — Z1231 Encounter for screening mammogram for malignant neoplasm of breast: Secondary | ICD-10-CM | POA: Diagnosis not present

## 2015-02-04 DIAGNOSIS — Z79899 Other long term (current) drug therapy: Secondary | ICD-10-CM | POA: Diagnosis not present

## 2015-02-24 DIAGNOSIS — M19071 Primary osteoarthritis, right ankle and foot: Secondary | ICD-10-CM | POA: Diagnosis not present

## 2015-02-24 DIAGNOSIS — M25561 Pain in right knee: Secondary | ICD-10-CM | POA: Diagnosis not present

## 2015-02-24 DIAGNOSIS — M19041 Primary osteoarthritis, right hand: Secondary | ICD-10-CM | POA: Diagnosis not present

## 2015-02-24 DIAGNOSIS — Z09 Encounter for follow-up examination after completed treatment for conditions other than malignant neoplasm: Secondary | ICD-10-CM | POA: Diagnosis not present

## 2015-02-28 DIAGNOSIS — M47816 Spondylosis without myelopathy or radiculopathy, lumbar region: Secondary | ICD-10-CM | POA: Diagnosis not present

## 2015-02-28 DIAGNOSIS — Z79891 Long term (current) use of opiate analgesic: Secondary | ICD-10-CM | POA: Diagnosis not present

## 2015-05-31 DIAGNOSIS — Z23 Encounter for immunization: Secondary | ICD-10-CM | POA: Diagnosis not present

## 2015-06-06 DIAGNOSIS — Z79899 Other long term (current) drug therapy: Secondary | ICD-10-CM | POA: Diagnosis not present

## 2015-06-06 DIAGNOSIS — H2513 Age-related nuclear cataract, bilateral: Secondary | ICD-10-CM | POA: Diagnosis not present

## 2015-06-06 DIAGNOSIS — H25013 Cortical age-related cataract, bilateral: Secondary | ICD-10-CM | POA: Diagnosis not present

## 2015-06-21 DIAGNOSIS — M47816 Spondylosis without myelopathy or radiculopathy, lumbar region: Secondary | ICD-10-CM | POA: Diagnosis not present

## 2015-06-21 DIAGNOSIS — Z79891 Long term (current) use of opiate analgesic: Secondary | ICD-10-CM | POA: Diagnosis not present

## 2015-06-21 DIAGNOSIS — M1711 Unilateral primary osteoarthritis, right knee: Secondary | ICD-10-CM | POA: Diagnosis not present

## 2015-07-22 DIAGNOSIS — E559 Vitamin D deficiency, unspecified: Secondary | ICD-10-CM | POA: Diagnosis not present

## 2015-07-22 DIAGNOSIS — Z79899 Other long term (current) drug therapy: Secondary | ICD-10-CM | POA: Diagnosis not present

## 2015-07-22 DIAGNOSIS — R5381 Other malaise: Secondary | ICD-10-CM | POA: Diagnosis not present

## 2015-07-28 DIAGNOSIS — M25561 Pain in right knee: Secondary | ICD-10-CM | POA: Diagnosis not present

## 2015-07-28 DIAGNOSIS — Z79899 Other long term (current) drug therapy: Secondary | ICD-10-CM | POA: Diagnosis not present

## 2015-07-28 DIAGNOSIS — M25562 Pain in left knee: Secondary | ICD-10-CM | POA: Diagnosis not present

## 2015-07-28 DIAGNOSIS — E669 Obesity, unspecified: Secondary | ICD-10-CM | POA: Diagnosis not present

## 2015-10-12 DIAGNOSIS — Z0001 Encounter for general adult medical examination with abnormal findings: Secondary | ICD-10-CM | POA: Diagnosis not present

## 2015-10-12 DIAGNOSIS — D239 Other benign neoplasm of skin, unspecified: Secondary | ICD-10-CM | POA: Diagnosis not present

## 2015-10-12 DIAGNOSIS — E782 Mixed hyperlipidemia: Secondary | ICD-10-CM | POA: Diagnosis not present

## 2015-10-12 DIAGNOSIS — M158 Other polyosteoarthritis: Secondary | ICD-10-CM | POA: Diagnosis not present

## 2015-10-12 DIAGNOSIS — I10 Essential (primary) hypertension: Secondary | ICD-10-CM | POA: Diagnosis not present

## 2015-10-12 DIAGNOSIS — Z79899 Other long term (current) drug therapy: Secondary | ICD-10-CM | POA: Diagnosis not present

## 2015-10-12 DIAGNOSIS — Z1389 Encounter for screening for other disorder: Secondary | ICD-10-CM | POA: Diagnosis not present

## 2015-10-12 DIAGNOSIS — F329 Major depressive disorder, single episode, unspecified: Secondary | ICD-10-CM | POA: Diagnosis not present

## 2015-10-12 DIAGNOSIS — M1990 Unspecified osteoarthritis, unspecified site: Secondary | ICD-10-CM | POA: Diagnosis not present

## 2015-10-12 DIAGNOSIS — D2339 Other benign neoplasm of skin of other parts of face: Secondary | ICD-10-CM | POA: Diagnosis not present

## 2015-10-12 DIAGNOSIS — E559 Vitamin D deficiency, unspecified: Secondary | ICD-10-CM | POA: Diagnosis not present

## 2015-10-12 DIAGNOSIS — Z6841 Body Mass Index (BMI) 40.0 and over, adult: Secondary | ICD-10-CM | POA: Diagnosis not present

## 2015-10-13 ENCOUNTER — Ambulatory Visit: Payer: Medicare Other | Admitting: Radiation Oncology

## 2015-10-18 DIAGNOSIS — Z79891 Long term (current) use of opiate analgesic: Secondary | ICD-10-CM | POA: Diagnosis not present

## 2015-10-18 DIAGNOSIS — G894 Chronic pain syndrome: Secondary | ICD-10-CM | POA: Diagnosis not present

## 2015-10-18 DIAGNOSIS — M47816 Spondylosis without myelopathy or radiculopathy, lumbar region: Secondary | ICD-10-CM | POA: Diagnosis not present

## 2015-10-31 DIAGNOSIS — N390 Urinary tract infection, site not specified: Secondary | ICD-10-CM | POA: Diagnosis not present

## 2015-11-25 ENCOUNTER — Encounter: Payer: Self-pay | Admitting: *Deleted

## 2016-01-20 DIAGNOSIS — Z79899 Other long term (current) drug therapy: Secondary | ICD-10-CM | POA: Diagnosis not present

## 2016-01-20 LAB — HEPATIC FUNCTION PANEL
ALT: 28 U/L (ref 7–35)
AST: 22 U/L (ref 13–35)
Alkaline Phosphatase: 68 U/L (ref 25–125)
BILIRUBIN, TOTAL: 0.4 mg/dL

## 2016-01-20 LAB — BASIC METABOLIC PANEL
BUN: 23 mg/dL — AB (ref 4–21)
Creatinine: 1.2 mg/dL — AB (ref 0.5–1.1)
Glucose: 95 mg/dL
Potassium: 4.1 mmol/L (ref 3.4–5.3)
SODIUM: 138 mmol/L (ref 137–147)

## 2016-01-20 LAB — CBC AND DIFFERENTIAL
HEMATOCRIT: 37 % (ref 36–46)
Hemoglobin: 12.3 g/dL (ref 12.0–16.0)
PLATELETS: 307 10*3/uL (ref 150–399)
WBC: 6 10^3/mL

## 2016-01-26 DIAGNOSIS — M1990 Unspecified osteoarthritis, unspecified site: Secondary | ICD-10-CM | POA: Diagnosis not present

## 2016-01-26 DIAGNOSIS — M170 Bilateral primary osteoarthritis of knee: Secondary | ICD-10-CM | POA: Diagnosis not present

## 2016-01-26 DIAGNOSIS — Z09 Encounter for follow-up examination after completed treatment for conditions other than malignant neoplasm: Secondary | ICD-10-CM | POA: Diagnosis not present

## 2016-01-26 DIAGNOSIS — M160 Bilateral primary osteoarthritis of hip: Secondary | ICD-10-CM | POA: Diagnosis not present

## 2016-02-07 DIAGNOSIS — G894 Chronic pain syndrome: Secondary | ICD-10-CM | POA: Diagnosis not present

## 2016-02-07 DIAGNOSIS — Z79891 Long term (current) use of opiate analgesic: Secondary | ICD-10-CM | POA: Diagnosis not present

## 2016-02-07 DIAGNOSIS — M47816 Spondylosis without myelopathy or radiculopathy, lumbar region: Secondary | ICD-10-CM | POA: Diagnosis not present

## 2016-02-20 DIAGNOSIS — Z79899 Other long term (current) drug therapy: Secondary | ICD-10-CM | POA: Diagnosis not present

## 2016-02-20 DIAGNOSIS — H353132 Nonexudative age-related macular degeneration, bilateral, intermediate dry stage: Secondary | ICD-10-CM | POA: Diagnosis not present

## 2016-03-08 DIAGNOSIS — H35362 Drusen (degenerative) of macula, left eye: Secondary | ICD-10-CM | POA: Diagnosis not present

## 2016-03-08 DIAGNOSIS — H353211 Exudative age-related macular degeneration, right eye, with active choroidal neovascularization: Secondary | ICD-10-CM | POA: Diagnosis not present

## 2016-03-08 DIAGNOSIS — H35361 Drusen (degenerative) of macula, right eye: Secondary | ICD-10-CM | POA: Diagnosis not present

## 2016-03-08 DIAGNOSIS — H353133 Nonexudative age-related macular degeneration, bilateral, advanced atrophic without subfoveal involvement: Secondary | ICD-10-CM | POA: Diagnosis not present

## 2016-03-13 DIAGNOSIS — H353211 Exudative age-related macular degeneration, right eye, with active choroidal neovascularization: Secondary | ICD-10-CM | POA: Diagnosis not present

## 2016-03-17 DIAGNOSIS — C50919 Malignant neoplasm of unspecified site of unspecified female breast: Secondary | ICD-10-CM

## 2016-03-17 HISTORY — DX: Malignant neoplasm of unspecified site of unspecified female breast: C50.919

## 2016-03-17 HISTORY — PX: BREAST BIOPSY: SHX20

## 2016-04-03 ENCOUNTER — Ambulatory Visit
Admission: RE | Admit: 2016-04-03 | Discharge: 2016-04-03 | Disposition: A | Discharge status: Home / Self Care | Payer: Medicare Other | Source: Ambulatory Visit | Attending: Internal Medicine | Admitting: Internal Medicine

## 2016-04-03 ENCOUNTER — Other Ambulatory Visit: Payer: Self-pay | Admitting: Internal Medicine

## 2016-04-03 DIAGNOSIS — R928 Other abnormal and inconclusive findings on diagnostic imaging of breast: Secondary | ICD-10-CM | POA: Diagnosis not present

## 2016-04-03 DIAGNOSIS — N6489 Other specified disorders of breast: Secondary | ICD-10-CM | POA: Diagnosis not present

## 2016-04-03 DIAGNOSIS — N6320 Unspecified lump in the left breast, unspecified quadrant: Secondary | ICD-10-CM

## 2016-04-05 ENCOUNTER — Other Ambulatory Visit: Payer: Self-pay | Admitting: Internal Medicine

## 2016-04-05 DIAGNOSIS — N6320 Unspecified lump in the left breast, unspecified quadrant: Secondary | ICD-10-CM

## 2016-04-11 DIAGNOSIS — Z96651 Presence of right artificial knee joint: Secondary | ICD-10-CM | POA: Diagnosis not present

## 2016-04-11 DIAGNOSIS — Z471 Aftercare following joint replacement surgery: Secondary | ICD-10-CM | POA: Diagnosis not present

## 2016-04-11 DIAGNOSIS — M47816 Spondylosis without myelopathy or radiculopathy, lumbar region: Secondary | ICD-10-CM | POA: Diagnosis not present

## 2016-04-12 DIAGNOSIS — H353211 Exudative age-related macular degeneration, right eye, with active choroidal neovascularization: Secondary | ICD-10-CM | POA: Diagnosis not present

## 2016-04-13 DIAGNOSIS — M5117 Intervertebral disc disorders with radiculopathy, lumbosacral region: Secondary | ICD-10-CM | POA: Diagnosis not present

## 2016-04-13 DIAGNOSIS — M4726 Other spondylosis with radiculopathy, lumbar region: Secondary | ICD-10-CM | POA: Diagnosis not present

## 2016-04-13 DIAGNOSIS — M5116 Intervertebral disc disorders with radiculopathy, lumbar region: Secondary | ICD-10-CM | POA: Diagnosis not present

## 2016-04-13 DIAGNOSIS — G548 Other nerve root and plexus disorders: Secondary | ICD-10-CM | POA: Diagnosis not present

## 2016-05-04 ENCOUNTER — Other Ambulatory Visit: Payer: Self-pay | Admitting: Internal Medicine

## 2016-05-04 ENCOUNTER — Ambulatory Visit
Admission: RE | Admit: 2016-05-04 | Discharge: 2016-05-04 | Disposition: A | Discharge status: Home / Self Care | Payer: Medicare Other | Source: Ambulatory Visit | Attending: Internal Medicine | Admitting: Internal Medicine

## 2016-05-04 DIAGNOSIS — N6320 Unspecified lump in the left breast, unspecified quadrant: Secondary | ICD-10-CM

## 2016-05-04 DIAGNOSIS — C50212 Malignant neoplasm of upper-inner quadrant of left female breast: Secondary | ICD-10-CM | POA: Diagnosis not present

## 2016-05-04 DIAGNOSIS — R928 Other abnormal and inconclusive findings on diagnostic imaging of breast: Secondary | ICD-10-CM | POA: Diagnosis not present

## 2016-05-04 DIAGNOSIS — N63 Unspecified lump in breast: Secondary | ICD-10-CM | POA: Diagnosis not present

## 2016-05-04 DIAGNOSIS — N6489 Other specified disorders of breast: Secondary | ICD-10-CM | POA: Diagnosis not present

## 2016-05-08 ENCOUNTER — Other Ambulatory Visit: Payer: Self-pay | Admitting: Internal Medicine

## 2016-05-08 DIAGNOSIS — R928 Other abnormal and inconclusive findings on diagnostic imaging of breast: Secondary | ICD-10-CM

## 2016-05-08 DIAGNOSIS — C50912 Malignant neoplasm of unspecified site of left female breast: Secondary | ICD-10-CM

## 2016-05-09 ENCOUNTER — Telehealth: Payer: Self-pay | Admitting: *Deleted

## 2016-05-09 ENCOUNTER — Ambulatory Visit
Admission: RE | Admit: 2016-05-09 | Discharge: 2016-05-09 | Disposition: A | Discharge status: Home / Self Care | Payer: Medicare Other | Source: Ambulatory Visit | Attending: Internal Medicine | Admitting: Internal Medicine

## 2016-05-09 DIAGNOSIS — R928 Other abnormal and inconclusive findings on diagnostic imaging of breast: Secondary | ICD-10-CM

## 2016-05-09 DIAGNOSIS — N6489 Other specified disorders of breast: Secondary | ICD-10-CM | POA: Diagnosis not present

## 2016-05-09 DIAGNOSIS — C50212 Malignant neoplasm of upper-inner quadrant of left female breast: Secondary | ICD-10-CM | POA: Diagnosis not present

## 2016-05-09 DIAGNOSIS — N63 Unspecified lump in breast: Secondary | ICD-10-CM | POA: Diagnosis not present

## 2016-05-09 DIAGNOSIS — C50912 Malignant neoplasm of unspecified site of left female breast: Secondary | ICD-10-CM

## 2016-05-09 NOTE — Telephone Encounter (Signed)
Called pt to discuss [place] on 05/16/16. Pt relate she wish to have Dr. Donne Hazel as her surgeon. Informed pt that he is not in our [facility] next week and that I will inform [facility] to send a referral to his office for an appt. Received verbal understanding.

## 2016-05-16 ENCOUNTER — Encounter: Payer: Self-pay | Admitting: Genetic Counselor

## 2016-05-16 DIAGNOSIS — H353211 Exudative age-related macular degeneration, right eye, with active choroidal neovascularization: Secondary | ICD-10-CM | POA: Diagnosis not present

## 2016-05-18 DIAGNOSIS — C50212 Malignant neoplasm of upper-inner quadrant of left female breast: Secondary | ICD-10-CM | POA: Diagnosis not present

## 2016-05-22 ENCOUNTER — Telehealth: Payer: Self-pay | Admitting: Oncology

## 2016-05-22 ENCOUNTER — Encounter: Payer: Self-pay | Admitting: Oncology

## 2016-05-22 NOTE — Telephone Encounter (Signed)
Appt scheduled w/Magrinat on 9/14 at 430pm. Aware to arrive 30 minutes early. Mrs. Karwacki is a very pleasant pt. Location was given. Demographics verified.

## 2016-05-23 ENCOUNTER — Encounter: Payer: Self-pay | Admitting: Oncology

## 2016-05-23 DIAGNOSIS — G894 Chronic pain syndrome: Secondary | ICD-10-CM | POA: Diagnosis not present

## 2016-05-23 DIAGNOSIS — M4807 Spinal stenosis, lumbosacral region: Secondary | ICD-10-CM | POA: Diagnosis not present

## 2016-05-23 DIAGNOSIS — M5416 Radiculopathy, lumbar region: Secondary | ICD-10-CM | POA: Diagnosis not present

## 2016-05-23 DIAGNOSIS — M47816 Spondylosis without myelopathy or radiculopathy, lumbar region: Secondary | ICD-10-CM | POA: Diagnosis not present

## 2016-05-24 DIAGNOSIS — G4733 Obstructive sleep apnea (adult) (pediatric): Secondary | ICD-10-CM | POA: Diagnosis not present

## 2016-05-30 ENCOUNTER — Other Ambulatory Visit: Payer: Self-pay | Admitting: *Deleted

## 2016-05-30 DIAGNOSIS — I10 Essential (primary) hypertension: Secondary | ICD-10-CM

## 2016-05-31 ENCOUNTER — Other Ambulatory Visit (HOSPITAL_BASED_OUTPATIENT_CLINIC_OR_DEPARTMENT_OTHER): Payer: Medicare Other

## 2016-05-31 ENCOUNTER — Ambulatory Visit (HOSPITAL_BASED_OUTPATIENT_CLINIC_OR_DEPARTMENT_OTHER): Payer: Medicare Other | Admitting: Oncology

## 2016-05-31 DIAGNOSIS — M546 Pain in thoracic spine: Secondary | ICD-10-CM | POA: Diagnosis not present

## 2016-05-31 DIAGNOSIS — I10 Essential (primary) hypertension: Secondary | ICD-10-CM

## 2016-05-31 DIAGNOSIS — C50212 Malignant neoplasm of upper-inner quadrant of left female breast: Secondary | ICD-10-CM

## 2016-05-31 DIAGNOSIS — N62 Hypertrophy of breast: Secondary | ICD-10-CM | POA: Diagnosis not present

## 2016-05-31 DIAGNOSIS — Z170 Estrogen receptor positive status [ER+]: Secondary | ICD-10-CM | POA: Diagnosis not present

## 2016-05-31 DIAGNOSIS — Z96649 Presence of unspecified artificial hip joint: Secondary | ICD-10-CM | POA: Diagnosis not present

## 2016-05-31 DIAGNOSIS — Z96659 Presence of unspecified artificial knee joint: Secondary | ICD-10-CM | POA: Diagnosis not present

## 2016-05-31 LAB — CBC WITH DIFFERENTIAL/PLATELET
BASO%: 0.8 % (ref 0.0–2.0)
Basophils Absolute: 0.1 10*3/uL (ref 0.0–0.1)
EOS ABS: 0.3 10*3/uL (ref 0.0–0.5)
EOS%: 3.3 % (ref 0.0–7.0)
HEMATOCRIT: 39.6 % (ref 34.8–46.6)
HGB: 12.9 g/dL (ref 11.6–15.9)
LYMPH%: 19.6 % (ref 14.0–49.7)
MCH: 29 pg (ref 25.1–34.0)
MCHC: 32.7 g/dL (ref 31.5–36.0)
MCV: 88.8 fL (ref 79.5–101.0)
MONO#: 1.1 10*3/uL — AB (ref 0.1–0.9)
MONO%: 10.7 % (ref 0.0–14.0)
NEUT#: 6.5 10*3/uL (ref 1.5–6.5)
NEUT%: 65.6 % (ref 38.4–76.8)
PLATELETS: 310 10*3/uL (ref 145–400)
RBC: 4.45 10*6/uL (ref 3.70–5.45)
RDW: 13.4 % (ref 11.2–14.5)
WBC: 9.8 10*3/uL (ref 3.9–10.3)
lymph#: 1.9 10*3/uL (ref 0.9–3.3)

## 2016-05-31 LAB — COMPREHENSIVE METABOLIC PANEL
ALT: 30 U/L (ref 0–55)
ANION GAP: 10 meq/L (ref 3–11)
AST: 20 U/L (ref 5–34)
Albumin: 3.7 g/dL (ref 3.5–5.0)
Alkaline Phosphatase: 79 U/L (ref 40–150)
BUN: 22.9 mg/dL (ref 7.0–26.0)
CALCIUM: 9.6 mg/dL (ref 8.4–10.4)
CHLORIDE: 102 meq/L (ref 98–109)
CO2: 24 meq/L (ref 22–29)
Creatinine: 1.3 mg/dL — ABNORMAL HIGH (ref 0.6–1.1)
EGFR: 40 mL/min/{1.73_m2} — ABNORMAL LOW (ref >90)
Glucose: 94 mg/dl (ref 70–140)
Potassium: 4.1 mEq/L (ref 3.5–5.1)
Sodium: 137 mEq/L (ref 136–145)
Total Bilirubin: <0.3 mg/dL (ref 0.20–1.20)
Total Protein: 6.9 g/dL (ref 6.4–8.3)

## 2016-05-31 NOTE — Progress Notes (Signed)
[place]  [phone]) [phone] [phone]) [phone]     ID: Debra Griffith DOB: 31-Jul-1941  MR#: [id number]  MHD#:[id number]  Patient Care Team: Debra Huddle, MD as PCP - General (Internal Medicine) Debra Cruel, MD as Consulting Physician (Oncology) Debra Bookbinder, MD as Consulting Physician (General Surgery) Debra Limbo, MD as Consulting Physician (Plastic Surgery) Debra Arabian, MD as Consulting Physician (Orthopedic Surgery) Debra Merino, MD as Consulting Physician (Rheumatology)  OTHER MD:  CHIEF COMPLAINT: Estrogen receptor positive breast cancer  CURRENT TREATMENT: Awaiting definitive surgery   BREAST CANCER HISTORY: Debra Griffith trauma to the left breast but did not think much about it until while swimming on the medication she felt a hard mass in her left breast. She brought it to medical attention and on 05/04/2016 she underwent left diagnostic mammography with tomography at the breast Center. This showed the breast density to be category B. In the left breast upper inner quadrant there was a persistent area of asymmetry measuring 4.9 cm. On exam this was a firm but poorly defined palpable mass. Biopsy of this mass 05/04/2016 showed ([id number]) an invasive ductal carcinoma, grade 1, estrogen receptor 90% positive with moderate staining intensity, progesterone receptor 90% positive with strong staining intensity, with an MIB-1 of 10%, and no HER-2 amplification, the signals ratio being 1.19 and the number per cell 1.90.  On 05/09/2016 the patient had ultrasonography of the left axilla which was benign. On the same day she had a second biopsy of what is either a second mass or a distant area of the same mass (in the same quadrant) and this showed ([id number]) invasive ductal carcinoma, grade 2, estrogen receptor 100% positive, progesterone receptor 90% positive, both with strong staining intensity, with an MIB-1 of 15%, and no HER-2  amplification, the signals ratio being 1.50 and the number per cell 3.15.  The patient's subsequent history is as detailed below.  INTERVAL HISTORY: Debra Griffith was evaluated in the breast clinic 05/31/2016 accompanied by her husband Debra Griffith. Her case was also presented in the multidisciplinary breast cancer conference 05/16/2016. At that time a preliminary plan was proposed: Breast conserving surgery with sentinel lymph node sampling, consideration of Oncotype, consideration of adjuvant radiation, and anti-estrogens at the completion of local treatment.  REVIEW OF SYSTEMS: She has a history of palpitations which have been extensively evaluated and she had been assured that they are benign and they require no further evaluation and certainly no treatment. She sleeps poorly. She describes herself is moderately fatigued. She does have some aching in her left breast particular since she had the biopsies. She has a history of urinary tract infections. She has back and joint pain and of course she is status post multiple or orthopedic procedures. She describes herself as anxious but not depressed. Currently she has a CNA combine a house in massage her feet and legs and she finds this very helpful. A detailed review of systems today was otherwise stable  PAST MEDICAL HISTORY: Past Medical History:  Diagnosis Date  . Anemia    hx of   . Anxiety   . Arthritis    OA  . Complication of anesthesia    severe diarrhea and vomiting after hysterectomy   . DDD (degenerative disc disease), lumbar   . Depression   . Dysrhythmia    OVER 10 YRS AGO - SAW CARDIOLOGIST FOR IRREGULAR HEART BEAT - NO TREATMENT NEEDED AND HAS NOT HAD TO SEE CARDIOLOGIST SINCE.  Marland Kitchen GERD (gastroesophageal reflux disease)   .  H/O bronchitis    FLARE UPS USUALLY ONCE A YEAR  . Hypertension   . Insomnia   . Obesity   . PONV (postoperative nausea and vomiting)    SEVERE N&V AND DIARRHEA AFTER HYSTERECTOMY AND AFTER WISDOM TEETH EXTRACTIONS  - NO PROBLEMS WITH LAST 2 SURGERIES - THE HIP AND LEFT KNEE  . Right bundle branch block   . Vitamin D deficiency     PAST SURGICAL HISTORY: Past Surgical History:  Procedure Laterality Date  . ABDOMINAL HYSTERECTOMY    . CESAREAN SECTION     x 3  . TOTAL HIP ARTHROPLASTY  06/24/2012   Procedure: TOTAL HIP ARTHROPLASTY;  Surgeon: [name], MD;  Location: [facility];  Service: Orthopedics;  Laterality: Left;  . TOTAL KNEE ARTHROPLASTY N/A 11/02/2013   Procedure: LEFT TOTAL KNEE ARTHROPLASTY WITH RIGHT KNEE CORTISONE INJECTION;  Surgeon: [name], MD;  Location: [facility];  Service: Orthopedics;  Laterality: N/A;  . TOTAL KNEE ARTHROPLASTY Right 05/31/2014   Procedure: RIGHT TOTAL KNEE ARTHROPLASTY;  Surgeon: [name], MD;  Location: [facility];  Service: Orthopedics;  Laterality: Right;  . WISDOM TOOTH EXTRACTION  1970's   admitted to hospital for surgery    FAMILY HISTORY No family history on file.  The patient's mother died at age 82. The patient's father died at age 90 following a stroke. The patient had no brothers, 1 sister. There is no history of breast or ovarian cancer in the family.   GYNECOLOGIC HISTORY:  No LMP recorded. Patient has had a hysterectomy. Menarche age 47, first live birth age 72. The patient is GX P3. She underwent total abdominal hysterectomy, with bilateral salpingo-oophorectomy, in 1993. She used estrogen replacement approximately 9 months. She never used oral contraceptives.  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 Her husband Debra Griffith used to run for season small and similar businesses but he also is retired. Son Debra Griffith is a Programme researcher, broadcasting/film/video in [employer]. Son Debra Griffith is a Engineer, maintenance (IT) in [employer] Daughter Debra Griffith lives in [place]. 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 The patient has 5 grandchildren. 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    ADVANCED DIRECTIVES:  In place   HEALTH MAINTENANCE: Social History  Substance Use Topics  . Smoking status: Never Smoker  . Smokeless tobacco: Never Used  . Alcohol use No     Colonoscopy:2015/[facility]  PAP:  Bone density: Remote   Allergies  Allergen Reactions  . Morphine And Related     hallucinations    Current Outpatient Prescriptions  Medication Sig Dispense Refill  . bevacizumab (AVASTIN) 1.25 mg/0.1 mL SOLN 1.25 mg by Intravitreal route as directed. Dose not verified - Per opthamology    . cyclobenzaprine (FLEXERIL) 10 MG tablet Take by mouth.    . zolpidem (AMBIEN) 10 MG tablet Take by mouth.    Marland Kitchen acetaminophen (TYLENOL) 325 MG tablet Take 2 tablets (650 mg total) by mouth every 6 (six) hours as needed for mild pain (or Fever >/= 101). 40 tablet 0  . albuterol (PROVENTIL HFA;VENTOLIN HFA) 108 (90 BASE) MCG/ACT inhaler Inhale 2 puffs into the lungs every 6 (six) hours as needed. Wheezing and shortness of breath    . amLODipine (NORVASC) 10 MG tablet Take by mouth.    . bisacodyl (DULCOLAX) 10 MG suppository Place 1 suppository (10 mg total) rectally daily as needed for moderate constipation. 12 suppository 0  . buPROPion (  WELLBUTRIN SR) 150 MG 12 hr tablet Take 150 mg by mouth every morning.    . clonazePAM (KLONOPIN) 1 MG tablet Take by mouth.    Marland Kitchen HYDROcodone-acetaminophen (NORCO) 10-325 MG tablet Take by mouth.    . hydroxychloroquine (PLAQUENIL) 200 MG tablet     . LYRICA 75 MG capsule     . methocarbamol (ROBAXIN) 500 MG tablet Take 1 tablet (500 mg total) by mouth every 6 (six) hours as needed for muscle spasms. 80 tablet 0  . metoCLOPramide (REGLAN) 5 MG tablet Take 1-2 tablets (5-10 mg total) by mouth every 8 (eight) hours as needed for nausea (if ondansetron (ZOFRAN) ineffective.). 40 tablet 0  . metoprolol tartrate (LOPRESSOR) 25 MG tablet Take by mouth.    . triamterene-hydrochlorothiazide (MAXZIDE-25) 37.5-25 MG tablet Take by mouth.    . valsartan (DIOVAN) 320 MG tablet Take by  mouth.     No current facility-administered medications for this visit.     OBJECTIVE: Older white woman using a chair walker Vitals:   05/31/16 1632  BP: (!) 144/67  Pulse: (!) 107  Resp: 18  Temp: 98 F (36.7 C)     Body mass index is 52.73 kg/m.    ECOG FS:1 - Symptomatic but completely ambulatory  Ocular: Sclerae unicteric, pupils equal, round and reactive to light Ear-nose-throat: Oropharynx clear and moist Lymphatic: No cervical or supraclavicular adenopathy Lungs no rales or rhonchi, good excursion bilaterally Heart regular rate and rhythm, no murmur appreciated Abd soft, obese, nontender, positive bowel sounds MSK no focal spinal tenderness, no joint edema Neuro: non-focal, well-oriented, appropriate affect Breasts: The right breast is unremarkable. In the left breast upper inner quadrant there is a poorly defined area of firmness, not associated with erythema. There is no nipple inversion. Left axilla is benign.   LAB RESULTS:  CMP     Component Value Date/Time   NA 137 05/31/2016 1541   K 4.1 05/31/2016 1541   CL 100 06/02/2014 0445   CO2 24 05/31/2016 1541   GLUCOSE 94 05/31/2016 1541   BUN 22.9 05/31/2016 1541   CREATININE 1.3 (H) 05/31/2016 1541   CALCIUM 9.6 05/31/2016 1541   PROT 6.9 05/31/2016 1541   ALBUMIN 3.7 05/31/2016 1541   AST 20 05/31/2016 1541   ALT 30 05/31/2016 1541   ALKPHOS 79 05/31/2016 1541   BILITOT <0.30 05/31/2016 1541   GFRNONAA 83 (L) 06/02/2014 0445   GFRAA >90 06/02/2014 0445    INo results found for: SPEP, UPEP  Lab Results  Component Value Date   WBC 9.8 05/31/2016   NEUTROABS 6.5 05/31/2016   HGB 12.9 05/31/2016   HCT 39.6 05/31/2016   MCV 88.8 05/31/2016   PLT 310 05/31/2016      Chemistry      Component Value Date/Time   NA 137 05/31/2016 1541   K 4.1 05/31/2016 1541   CL 100 06/02/2014 0445   CO2 24 05/31/2016 1541   BUN 22.9 05/31/2016 1541   CREATININE 1.3 (H) 05/31/2016 1541      Component Value  Date/Time   CALCIUM 9.6 05/31/2016 1541   ALKPHOS 79 05/31/2016 1541   AST 20 05/31/2016 1541   ALT 30 05/31/2016 1541   BILITOT <0.30 05/31/2016 1541       No results found for: LABCA2  No components found for: LABCA125  No results for input(s): INR in the last 168 hours.  Urinalysis    Component Value Date/Time   COLORURINE YELLOW 05/26/2014 1054  APPEARANCEUR CLEAR 05/26/2014 1054   LABSPEC 1.014 05/26/2014 1054   PHURINE 6.5 05/26/2014 1054   GLUCOSEU NEGATIVE 05/26/2014 1054   HGBUR NEGATIVE 05/26/2014 1054   BILIRUBINUR NEGATIVE 05/26/2014 1054   KETONESUR NEGATIVE 05/26/2014 1054   PROTEINUR NEGATIVE 05/26/2014 1054   UROBILINOGEN 0.2 05/26/2014 1054   NITRITE POSITIVE (A) 05/26/2014 1054   LEUKOCYTESUR NEGATIVE 05/26/2014 1054     STUDIES: Mm Diag Breast Tomo Uni Left  Result Date: 05/09/2016 CLINICAL DATA:  Confirmation of clip placement after stereotactic core needle biopsy of focal asymmetry in the upper inner left breast without definite sonographic correlate. EXAM: 3D DIAGNOSTIC LEFT MAMMOGRAM POST STEREOTACTIC BIOPSY COMPARISON:  Previous exam(s). FINDINGS: 3D Mammographic images were obtained following stereotactic guided biopsy of focal asymmetry in the upper inner quadrant left breast. The coil marker clip migrated in the biopsy cavity approximate 1.5 cm inferior to the focal asymmetry. Expected post biopsy changes present without evidence of hematoma. IMPRESSION: Approximate 1.5 cm of inferior migration of the coil shaped tissue marker clip relative to the biopsied focal asymmetry in the upper inner left breast Final Assessment: Post Procedure Mammograms for Marker Placement Electronically Signed   By: Evangeline Dakin M.D.   On: 05/09/2016 12:46   Mm Diag Breast Tomo Uni Left  Result Date: 05/04/2016 CLINICAL DATA:  Post stereotactic guided biopsy of an indeterminate asymmetry in the medial left breast. EXAM: 3D DIAGNOSTIC LEFT MAMMOGRAM POST STEREOTACTIC  BIOPSY COMPARISON:  Previous exam(s). FINDINGS: 3D Mammographic images were obtained following stereotactic guided biopsy of a suspicious left breast asymmetry. A ribbon shaped biopsy marking clip is present at the site of the biopsied left breast asymmetry. IMPRESSION: Ribbon shaped biopsy marking clip appropriately positioned at site of asymmetry in the upper inner left breast. Final Assessment: Post Procedure Mammograms for Marker Placement Electronically Signed   By: Everlean Alstrom M.D.   On: 05/04/2016 12:25   Mm Diag Breast Tomo Uni Left  Result Date: 05/04/2016 CLINICAL DATA:  Follow-up of left breast asymmetry, thought to represent posttraumatic changes. EXAM: 2D DIGITAL DIAGNOSTIC UNILATERAL LEFT MAMMOGRAM WITH CAD AND ADJUNCT TOMO COMPARISON:  Previous exam(s). ACR Breast Density Category b: There are scattered areas of fibroglandular density. FINDINGS: Mammographic views of the left breast demonstrate persistent vaguely defined focal asymmetry in the left breast, slightly upper inner quadrant, posterior depth. It measures approximately 4.9 x 2.4 x 2.7 cm. On physical exam, this corresponds to a firm vaguely defined palpable mass. Mammographic images were processed with CAD. IMPRESSION: Persistent focal asymmetry in the left breast slightly upper inner quadrant, posterior depth, for which stereotactic core needle biopsy is recommended. RECOMMENDATION: Stereotactic core needle biopsy of the left breast. I have discussed the findings and recommendations with the patient. Results were also provided in writing at the conclusion of the visit. If applicable, a reminder letter will be sent to the patient regarding the next appointment. BI-RADS CATEGORY  4: Suspicious. Electronically Signed   By: Fidela Salisbury M.D.   On: 05/04/2016 10:31   Mm Lt Breast Bx W Loc Dev 1st Lesion Image Bx Spec Stereo Guide  Addendum Date: 05/15/2016   ADDENDUM REPORT: 05/10/2016 12:50 ADDENDUM: Pathology revealed GRADE  I INVASIVE DUCTAL CARCINOMA of the upper inner Left breast. This was found to be concordant by Dr. Everlean Alstrom. Pathology results were discussed with the patient by telephone. The patient reported doing well after the biopsy with tenderness at the site. Post biopsy instructions and care were reviewed and questions were answered. The  patient was encouraged to call [facility] for any additional concerns. Surgical consultation has been arranged with Dr. Rolm Griffith at [facility] on May 18, 2016. The patient is scheduled for a Left axillary ultrasound and Left breast stereotactic biopsy of the slightly more medial larger asymmetry on May 09, 2016. Pathology results reported by Terie Purser, RN on 05/09/2016. Electronically Signed   By: Everlean Alstrom M.D.   On: 05/10/2016 12:50   Result Date: 05/15/2016 CLINICAL DATA:  75 year old female with an indeterminate asymmetry in the medial left breast. EXAM: LEFT BREAST STEREOTACTIC CORE NEEDLE BIOPSY COMPARISON:  Previous exams. FINDINGS: The patient and I discussed the procedure of stereotactic-guided biopsy including benefits and alternatives. We discussed the high likelihood of a successful procedure. We discussed the risks of the procedure including infection, bleeding, tissue injury, clip migration, and inadequate sampling. Informed written consent was given. The usual time out protocol was performed immediately prior to the procedure. Using sterile technique and 1% Lidocaine as local anesthetic, under stereotactic guidance, a 9 gauge vacuum device was used to perform core needle biopsy of the asymmetry in the medial left breast using a superior to inferior approach. At the conclusion of the procedure, a ribbon shaped tissue marker clip was deployed into the biopsy cavity. Follow-up 2-view mammogram was performed and dictated separately. IMPRESSION: Stereotactic-guided biopsy of left breast asymmetry. No  apparent complications. Electronically Signed: By: Everlean Alstrom M.D. On: 05/04/2016 12:24   Mm Lt Breast Bx W Loc Dev 1st Lesion Image Bx Spec Stereo Guide  Addendum Date: 05/10/2016   ADDENDUM REPORT: 05/10/2016 12:49 ADDENDUM: Pathology revealed INVASIVE MAMMARY CARCINOMA of the upper inner quadrant of the Left breast, posterior depth. The differential includes grade 2 invasive lobular carcinoma. This was found to be concordant by Dr. Peggye Fothergill. Pathology results were discussed with the patient by telephone. The patient reported doing well after the biopsy with tenderness at the site. Post biopsy instructions and care were reviewed and questions were answered. The patient was encouraged to call [facility] for any additional concerns. Surgical consultation has been arranged with Dr. Rolm Griffith at [facility] on May 18, 2016. The patient had a Left breast upper inner quadrant biopsy on May 04, 2016 that showed GRADE I INVASIVE DUCTAL CARCINOMA. Pathology results reported by Terie Purser, RN on 05/10/2016. Electronically Signed   By: Evangeline Dakin M.D.   On: 05/10/2016 12:49   Result Date: 05/10/2016 CLINICAL DATA:  75 year old who presented with palpable thickening in the upper inner left breast, shown on diagnostic workup to have a focal asymmetry in the upper inner quadrant at posterior depth, without definite sonographic correlate. Recent stereotactic core needle biopsy of a smaller focal asymmetry with vague distortion medial and superior to the larger asymmetry revealed invasive ductal carcinoma. Stereotactic core needle biopsy of the larger focal asymmetry is performed. EXAM: LEFT BREAST STEREOTACTIC CORE NEEDLE BIOPSY COMPARISON:  Previous exams. FINDINGS: The patient and I discussed the procedure of stereotactic-guided biopsy including benefits and alternatives. We discussed the high likelihood of a successful procedure. We discussed  the risks of the procedure including infection, bleeding, tissue injury, clip migration, and inadequate sampling. Informed written consent was given. The usual time out protocol was performed immediately prior to the procedure. Using sterile technique with chlorhexidine as skin antisepsis, 1% lidocaine and 1% lidocaine with epinephrine as local anesthetic, under stereotactic guidance, a 9 gauge vacuum assisted device was used to  perform core needle biopsy of the larger focal asymmetry in the upper inner quadrant of the left breast using a superior approach. At the conclusion of the procedure, a coil shaped tissue marker clip was deployed into the biopsy cavity. Follow-up 2-view mammogram was performed and dictated separately. IMPRESSION: Stereotactic-guided biopsy of the larger focal asymmetry involving the upper inner quadrant of the left breast. No apparent complications. Electronically Signed: By: Evangeline Dakin M.D. On: 05/09/2016 12:43    ELIGIBLE FOR AVAILABLE RESEARCH PROTOCOL: PALLAS  ASSESSMENT: 75 y.o. [place] woman status post left breast upper inner quadrant biopsy 05/04/2016 for a clinical T2 N0 invasive ductal carcinoma, grade 1 estrogen and progesterone receptor positive, HER-2 nonamplified, with an MIB-1 of 10%.  (1) biopsy of a second area also in the upper inner quadrant of the left breast showed invasive ductal carcinoma, grade 2, estrogen and progesterone receptor positive, HER-2 nonamplified, with an MIB-1 of 15%.  (2) definitive surgery pending  (3) Oncotype to be obtained from the definitive surgical specimen  (4) adjuvant radiation to follow as appropriate  (5) to start anastrozole at the completion of local treatment.    PLAN: We spent the better part of today's hour-long appointment discussing the biology of breast cancer in general, and the specifics of the patient's tumor in particular. We first reviewed the fact that cancer is not one disease but more than 100  different diseases and that it is important to keep them separate-- otherwise when friends and relatives discuss their own cancer experiences with Mikaela confusion can result. Similarly we explained that if breast cancer spreads to the bone or liver, the patient would not have bone cancer or liver cancer, but breast cancer in the bone and breast cancer in the liver: one cancer in three places-- not 3 different cancers which otherwise would have to be treated in 3 different ways.  We discussed the difference between local and systemic therapy. In terms of loco-regional treatment, lumpectomy plus radiation is equivalent to mastectomy as far as survival is concerned. For this reason, and because the cosmetic results are generally superior, we recommend breast conserving surgery.   We then discussed the rationale for systemic therapy. There is some risk that this cancer may have already spread to other parts of her body. Patients frequently ask at this point about bone scans, CAT scans and PET scans to find out if they have occult breast cancer somewhere else. The problem is that in early stage disease we are much more likely to find false positives then true cancers and this would expose the patient to unnecessary procedures as well as unnecessary radiation. Scans cannot answer the question the patient really would like to know, which is whether she has microscopic disease elsewhere in her body. For those reasons we do not recommend them.  Of course we would proceed to aggressive evaluation of any symptoms that might suggest metastatic disease, but that is not the case here.  Next we went over the options for systemic therapy which are anti-estrogens, anti-HER-2 immunotherapy, and chemotherapy. Rosielee does not meet criteria for anti-HER-2 immunotherapy. She is a good candidate for anti-estrogens.  The question of chemotherapy is more complicated. Chemotherapy is most effective in rapidly growing, aggressive  tumors. It is much less effective in lower-grade, slow growing cancers, like Azile 's. For that reason we are going to request an Oncotype from the definitive surgical sample, as suggested by NCCN guidelines. That will help [place] make a definitive decision regarding chemotherapy in  this case.  Gaila is interested in bilateral breast reductions, and she has already met with plastics to discuss this. She will be meeting with radiation oncology within the next 2 weeks to complete her treatment team and get her radiation questions answered.  She is going to see me again in approximately 1 month. We should have her Oncotype results by then. She knows my expectation is she most likely will not need chemotherapy. She also knows she has a good prognosis overall.  Dot has a good understanding of the overall plan. She agrees with it. She knows the goal of treatment in her case is cure. She will call with any problems that may develop before her next visit here.  Debra Cruel, MD   05/31/2016 5:17 PM Medical Oncology and Hematology [facility] [address] [place], [place] [address] Tel. [phone]    Fax. [phone]

## 2016-06-01 ENCOUNTER — Telehealth: Payer: Self-pay | Admitting: Oncology

## 2016-06-01 ENCOUNTER — Telehealth: Payer: Self-pay | Admitting: *Deleted

## 2016-06-01 ENCOUNTER — Other Ambulatory Visit: Payer: Self-pay | Admitting: General Surgery

## 2016-06-01 DIAGNOSIS — C50912 Malignant neoplasm of unspecified site of left female breast: Secondary | ICD-10-CM

## 2016-06-01 DIAGNOSIS — C50212 Malignant neoplasm of upper-inner quadrant of left female breast: Secondary | ICD-10-CM

## 2016-06-01 MED ORDER — ANASTROZOLE 1 MG PO TABS: 1.0000 mg | ORAL_TABLET | Freq: Every day | ORAL | 6 refills | Status: DC

## 2016-06-01 NOTE — Telephone Encounter (Signed)
Spoke with pt to confirm 10/20 appt per los

## 2016-06-01 NOTE — Telephone Encounter (Signed)
Called pt and discussed neoadjuvant anastrozole prior to sx. Reinforced education provided by Dr. Jana Hakim. Denies further needs at this time.

## 2016-06-04 ENCOUNTER — Other Ambulatory Visit: Payer: Self-pay | Admitting: General Surgery

## 2016-06-04 ENCOUNTER — Ambulatory Visit: Payer: Medicare Other | Admitting: Oncology

## 2016-06-04 DIAGNOSIS — C50912 Malignant neoplasm of unspecified site of left female breast: Secondary | ICD-10-CM

## 2016-06-05 NOTE — Progress Notes (Signed)
Dr. Rosendo Gros returned page, given info and he will notify Dr. Donne Hazel. He stated Dr. Donne Hazel may call.

## 2016-06-05 NOTE — Progress Notes (Signed)
Called pt for pre-op call. Pt was unaware that her surgery has been scheduled for tomorrow. She states the office told her September 27th. Pt has not had the seed implanted for her surgery, that is scheduled for the 26th. I have paged Dr. Rosendo Gros, surgeon on call for Dr. Donne Hazel to notify him that pt isn't able to have surgery tomorrow.

## 2016-06-05 NOTE — Progress Notes (Signed)
Dr. Donne Hazel called and he verified that pt is supposed to have her surgery on the 27th, not tomorrow. He states that he will have his scheduler cancel the surgery in the AM. I called pt and let her know that I had talked with Dr. Donne Hazel and she was correct.

## 2016-06-08 ENCOUNTER — Encounter: Payer: Self-pay | Admitting: *Deleted

## 2016-06-08 NOTE — Pre-Procedure Instructions (Addendum)
Debra Griffith  06/08/2016      [facility] [address] - [place], [place] - [place] [address] Phone: [phone] Fax: [phone]    Your procedure is scheduled on Wednesday September 27.  Report to [facility] Admitting at 1:15 PM  Call this number if you have problems the morning of surgery:  [phone]   Remember:  Do not eat food or drink liquids after midnight.  Take these medicines the morning of surgery with A SIP OF WATER: acetaminophen (tylenol), amlodipine (norvasc), anastrozole (arimidex), bupropion (Wellbutrin), hydrocodone (Norco) if needed, metoprolol (lopressor), albuterol inhaler (bring with you to hospital)  7 days prior to surgery STOP taking any Aspirin, Aleve, Naproxen, Ibuprofen, Motrin, Advil, Goody's, BC's, all herbal medications, fish oil, and all vitamins    Do not wear jewelry, make-up or nail polish.  Do not wear lotions, powders, or perfumes, or deoderant.  Do not shave 48 hours prior to surgery.   Do not bring valuables to the hospital.  [facility] is not responsible for any belongings or valuables.  Contacts, dentures or bridgework may not be worn into surgery.  Leave your suitcase in the car.  After surgery it may be brought to your room.  For patients admitted to the hospital, discharge time will be determined by your treatment team.  Patients discharged the day of surgery will not be allowed to drive home.    Special instructions:    [name]- Preparing For Surgery  Before surgery, you can play an important role. Because skin is not sterile, your skin needs to be as free of germs as possible. You can reduce the number of germs on your skin by washing with CHG (chlorahexidine gluconate) Soap before surgery.  CHG is an antiseptic cleaner which kills germs and bonds with the skin to continue killing germs even after washing.  Please do not use if you have an allergy to CHG  or antibacterial soaps. If your skin becomes reddened/irritated stop using the CHG.  Do not shave (including legs and underarms) for at least 48 hours prior to first CHG shower. It is OK to shave your face.  Please follow these instructions carefully.   1. Shower the NIGHT BEFORE SURGERY and the MORNING OF SURGERY with CHG.   2. If you chose to wash your hair, wash your hair first as usual with your normal shampoo.  3. After you shampoo, rinse your hair and body thoroughly to remove the shampoo.  4. Use CHG as you would any other liquid soap. You can apply CHG directly to the skin and wash gently with a scrungie or a clean washcloth.   5. Apply the CHG Soap to your body ONLY FROM THE NECK DOWN.  Do not use on open wounds or open sores. Avoid contact with your eyes, ears, mouth and genitals (private parts). Wash genitals (private parts) with your normal soap.  6. Wash thoroughly, paying special attention to the area where your surgery will be performed.  7. Thoroughly rinse your body with warm water from the neck down.  8. DO NOT shower/wash with your normal soap after using and rinsing off the CHG Soap.  9. Pat yourself dry with a CLEAN TOWEL.   10. Wear CLEAN PAJAMAS   11. Place CLEAN SHEETS on your bed the night of your first shower and DO NOT SLEEP WITH PETS.    Day of Surgery: Do not apply any  deodorants/lotions. Please wear clean clothes to the hospital/surgery center.

## 2016-06-11 ENCOUNTER — Encounter (HOSPITAL_COMMUNITY)
Admission: RE | Admit: 2016-06-11 | Discharge: 2016-06-11 | Disposition: A | Discharge status: Home / Self Care | Payer: Medicare Other | Source: Ambulatory Visit | Attending: General Surgery | Admitting: General Surgery

## 2016-06-11 ENCOUNTER — Encounter (HOSPITAL_COMMUNITY): Payer: Self-pay

## 2016-06-11 DIAGNOSIS — K219 Gastro-esophageal reflux disease without esophagitis: Secondary | ICD-10-CM | POA: Diagnosis not present

## 2016-06-11 DIAGNOSIS — F419 Anxiety disorder, unspecified: Secondary | ICD-10-CM | POA: Diagnosis not present

## 2016-06-11 DIAGNOSIS — Z170 Estrogen receptor positive status [ER+]: Secondary | ICD-10-CM | POA: Diagnosis not present

## 2016-06-11 DIAGNOSIS — Z8261 Family history of arthritis: Secondary | ICD-10-CM | POA: Diagnosis not present

## 2016-06-11 DIAGNOSIS — M1990 Unspecified osteoarthritis, unspecified site: Secondary | ICD-10-CM | POA: Diagnosis not present

## 2016-06-11 DIAGNOSIS — Z8249 Family history of ischemic heart disease and other diseases of the circulatory system: Secondary | ICD-10-CM | POA: Diagnosis not present

## 2016-06-11 DIAGNOSIS — N6092 Unspecified benign mammary dysplasia of left breast: Secondary | ICD-10-CM | POA: Diagnosis not present

## 2016-06-11 DIAGNOSIS — Z7982 Long term (current) use of aspirin: Secondary | ICD-10-CM | POA: Diagnosis not present

## 2016-06-11 DIAGNOSIS — Z885 Allergy status to narcotic agent status: Secondary | ICD-10-CM | POA: Diagnosis not present

## 2016-06-11 DIAGNOSIS — C50212 Malignant neoplasm of upper-inner quadrant of left female breast: Secondary | ICD-10-CM | POA: Diagnosis not present

## 2016-06-11 DIAGNOSIS — Z90710 Acquired absence of both cervix and uterus: Secondary | ICD-10-CM | POA: Diagnosis not present

## 2016-06-11 DIAGNOSIS — F329 Major depressive disorder, single episode, unspecified: Secondary | ICD-10-CM | POA: Diagnosis not present

## 2016-06-11 DIAGNOSIS — I10 Essential (primary) hypertension: Secondary | ICD-10-CM | POA: Diagnosis not present

## 2016-06-11 HISTORY — DX: Unspecified macular degeneration: H35.30

## 2016-06-11 HISTORY — DX: Dental restoration status: Z98.811

## 2016-06-11 NOTE — Progress Notes (Signed)
EKG reading ACUTE MI. Pt with no complaints of chest pain, heart burn, shortness of breath, dizziness, or pressure. Debra Griffith with anesthesia notified and to come see pt in PAT.

## 2016-06-11 NOTE — Progress Notes (Addendum)
Anesthesia PAT Evaluation: Patient is a 75 year old female scheduled for left breast lumpectomy with bracketed radioactive seed and sentinel lymph node biopsy on 06/13/16 by Dr. Donne Hazel. She is scheduled for seed implants on 06/13/16.  She also says that she will have upcoming breast reconstruction/   reduction surgery with Dr. Iran Planas 06/25/16.  I was consulted because one of patient's pre-operative EKGs (06/11/16 9:00:47) had a computer interpretation of: Critical Test Result STEMI. NSR, LAD, pulmonary disease pattern, ST elevation consider inferior injury or acute infarct, ST elevation consider anterior injury or acute infarct, consider right ventricular involvement in acute inferior infarct. Second EKG (06/11/16 9:02:01) had a computer interpretation of NSR, LAD, right BBB. Patient denied chest pain, SOB, syncope, new edema. She denied any recent changes in her energy level. Interestingly, she reported that her 10/23/13 pre-operative EKG also showed acute MI (at [facility]), and she was sent to the ED and evaluated by ED physician Dr. Veryl Speak. He felt EKG was unchanged from prior tracing from 06/2012. Troponin < 0.30. She was asymptomatic, so she was discharged home and subsequently underwent left TKA on 11/02/13 and right TKA 05/31/14. She does not see a cardiologist, but did have a normal nuclear stress test on 05/01/04 with Dr. Fransico Him. She does not have a known CAD/MI/CHF history. No diabetes history. Today's EKGs reviewed by me and anesthesiologist Dr. Oletta Lamas and thought to appear similar to 10/23/13 and 06/26/12 tracings. Dr. Oletta Lamas recommended I speak with patient's PCP Dr. Dwaine Deter regarding any additional recommendations. I called and spoke with him regarding abnormal EKGs, surgery plans, and that patient was without any CV symptoms. With similar findings on previous EKG, no CV symptoms, and low risk surgery planned he did not think she would require additional labs (ie troponin) or  evaluation prior to surgery. (Today's EKGs, 10/23/13 and 06/26/12 tracings faxed to Dr. Inda Merlin as well with confirmation.). I updated Dr. Oletta Lamas. Dr. Mertie Moores interpreted her 9:02 tracing as NSR, LAD, right BBB, no significant change since last tracing (which in [place] was from 10/23/13 12:37:11.)  Other history include never smoker, morbid obesity (BMI 53.93), post-operative N/V, anxiety, depression, anemia, HTN, GERD, right eye macular degeneration (recieves Avastin injections), dental crowns (all teeth), left breast cancer, hysterectomy, left THA '13. She reported trouble waking up from anesthesia in the past and severe diarrhea and vomiting after her hysterectomy.   Meds include albuterol, amlodipine, Arimidex, ASA 81 mg, Wellbutrin SR, Klonopin, Norco, Plaquenil, magnesium, Robaxin, Reglan, Lopressor, fish oil, Maxzide-25, turmeric, valsartan, Ambien. She receives intraocular Avastin for macular degeneration, but Dr. Donne Hazel has already instructed her to hold the injections--so last one was > 1 month ago.  BP (!) 103/51   Pulse 76   Temp 36.9 C   Resp 18   Ht 5\' 1"  (1.549 m)   Wt 285 lb 6.4 oz (129.5 kg)   SpO2 99%   BMI 53.93 kg/m   Exam shows a pleasant, obese white female in NAD. Her husband is at her side. She walks with a rolling walking. No conversational dyspnea noted. Heart RRR, but distant (obesity, large breasts). I could not definitively hear any murmur. Lungs clear. She has up to 1+ pedal edema (which she says is stable). She denied chest pain, SOB, syncope, new edema, new fatigue or changes in activity tolerance. She does have chronic DOE which she says has not changes in at least 6 months. She is not very active in general due to back pain, but is  able to cook and do ADLs without CV symptoms. Today she was able to walk from the hospital entrance to PAT with her rolling walker at a slow, steady pace and did not have chest pain or significant exertional dyspnea. She feels at her  baseline. She reported a sleep study through [facility] last month, but has not heard the results yet (see below).  05/24/16 Sleep study: Impression: 1. Very mild obstructive sleep apnea. 2. Very mild hypoxemia associated with sleep disordered breathing. Recommendations: Given her absence of sleepiness and only very transient hypoxemia, treatment of this mild degree of OSA is not recommended.   Labs from 05/31/16 (done at Dr. Virgie Dad office) noted. Cr 1.3. H/H 12.9/39.6. Glucose 94.   Interpreting cardiologist felt EKG was stable. Dr. Inda Merlin did not recommend additional testing/evaluation prior to lumpectomy. She will be further evaluated prior to surgery, but if no acute changes then I would anticipate that she can proceed as planned from an anesthesia standpoint. I will forward my note to Dr. Donne Hazel and Dr. Iran Planas.   George Hugh [facility] Short Stay Center/Anesthesiology Phone [phone] 06/11/2016 4:40 PM  Addendum: Since patient is getting her radioactive seed implants this afternoon, Dr. Donne Hazel wanted to confirm that proceeding with surgery is still planned even though there is no cardiology input following yesterday's EKGs. (Dr. Acie Fredrickson did think her 9:02 tracing was not significantly changed, but otherwise no formal cardiology input requested by anesthesia or Dr. Inda Merlin.) I took information back to review with one of our anesthesiologists to verify my understanding of recommendations as discussed with Dr. Oletta Lamas. She is not at [facility] today, so I reviewed with anesthesiologist Dr. Orene Desanctis. He is in agreement that if she remains asymptomatic from a CV standpoint that it is anticipated that she can proceed as planned. He also anticipates that she can proceed with her breast reconstruction/reduction in the future if no acute changes between now and then. I have let Elmo Putt know at Dr. Cristal Generous office.   George Hugh [facility] Short Stay Center/Anesthesiology Phone  [phone] 06/12/2016 10:44 AM

## 2016-06-11 NOTE — Progress Notes (Signed)
PCP: Josetta Huddle No cardiologist, pt states she was worked up in 2015 with a test, possible Nuc med. Pt unsure of MD and states it may have been [facility].   Pt with recent sleep study with Dr. Maxwell Caul at [facility]. Requested sleep study.   EKG: 06/11/16, pt states she has hx of abnormal EKG.   Pt with no complaints of chest pain, SOB, signs of infection at this time.

## 2016-06-12 ENCOUNTER — Ambulatory Visit
Admission: RE | Admit: 2016-06-12 | Discharge: 2016-06-12 | Disposition: A | Discharge status: Home / Self Care | Payer: Medicare Other | Source: Ambulatory Visit | Attending: General Surgery | Admitting: General Surgery

## 2016-06-12 DIAGNOSIS — C50912 Malignant neoplasm of unspecified site of left female breast: Secondary | ICD-10-CM | POA: Diagnosis not present

## 2016-06-13 ENCOUNTER — Encounter (HOSPITAL_COMMUNITY): Payer: Self-pay | Admitting: *Deleted

## 2016-06-13 ENCOUNTER — Encounter (HOSPITAL_COMMUNITY)
Admission: RE | Disposition: A | Discharge status: Home / Self Care | Payer: Self-pay | Source: Ambulatory Visit | Attending: General Surgery

## 2016-06-13 ENCOUNTER — Ambulatory Visit (HOSPITAL_COMMUNITY): Payer: Medicare Other | Admitting: Vascular Surgery

## 2016-06-13 ENCOUNTER — Ambulatory Visit (HOSPITAL_COMMUNITY): Payer: Medicare Other | Admitting: Anesthesiology

## 2016-06-13 ENCOUNTER — Observation Stay (HOSPITAL_BASED_OUTPATIENT_CLINIC_OR_DEPARTMENT_OTHER)
Admission: RE | Admit: 2016-06-13 | Discharge: 2016-06-14 | Disposition: A | Discharge status: Home / Self Care | Payer: Medicare Other | Source: Ambulatory Visit | Attending: General Surgery | Admitting: General Surgery

## 2016-06-13 ENCOUNTER — Ambulatory Visit
Admission: RE | Admit: 2016-06-13 | Discharge: 2016-06-13 | Disposition: A | Discharge status: Home / Self Care | Payer: Medicare Other | Source: Ambulatory Visit | Attending: General Surgery | Admitting: General Surgery

## 2016-06-13 ENCOUNTER — Ambulatory Visit (HOSPITAL_COMMUNITY)
Admission: RE | Admit: 2016-06-13 | Discharge: 2016-06-13 | Disposition: A | Discharge status: Home / Self Care | Payer: Medicare Other | Source: Ambulatory Visit | Attending: General Surgery | Admitting: General Surgery

## 2016-06-13 DIAGNOSIS — C50912 Malignant neoplasm of unspecified site of left female breast: Secondary | ICD-10-CM

## 2016-06-13 DIAGNOSIS — N6092 Unspecified benign mammary dysplasia of left breast: Secondary | ICD-10-CM | POA: Insufficient documentation

## 2016-06-13 DIAGNOSIS — Z8249 Family history of ischemic heart disease and other diseases of the circulatory system: Secondary | ICD-10-CM | POA: Insufficient documentation

## 2016-06-13 DIAGNOSIS — Z90710 Acquired absence of both cervix and uterus: Secondary | ICD-10-CM | POA: Insufficient documentation

## 2016-06-13 DIAGNOSIS — Z7982 Long term (current) use of aspirin: Secondary | ICD-10-CM | POA: Diagnosis not present

## 2016-06-13 DIAGNOSIS — N63 Unspecified lump in breast: Secondary | ICD-10-CM | POA: Diagnosis not present

## 2016-06-13 DIAGNOSIS — Z170 Estrogen receptor positive status [ER+]: Secondary | ICD-10-CM | POA: Insufficient documentation

## 2016-06-13 DIAGNOSIS — F419 Anxiety disorder, unspecified: Secondary | ICD-10-CM | POA: Diagnosis not present

## 2016-06-13 DIAGNOSIS — K219 Gastro-esophageal reflux disease without esophagitis: Secondary | ICD-10-CM | POA: Diagnosis not present

## 2016-06-13 DIAGNOSIS — C50212 Malignant neoplasm of upper-inner quadrant of left female breast: Secondary | ICD-10-CM | POA: Diagnosis not present

## 2016-06-13 DIAGNOSIS — M1990 Unspecified osteoarthritis, unspecified site: Secondary | ICD-10-CM | POA: Diagnosis not present

## 2016-06-13 DIAGNOSIS — I10 Essential (primary) hypertension: Secondary | ICD-10-CM | POA: Insufficient documentation

## 2016-06-13 DIAGNOSIS — D4989 Neoplasm of unspecified behavior of other specified sites: Secondary | ICD-10-CM | POA: Diagnosis not present

## 2016-06-13 DIAGNOSIS — Z8261 Family history of arthritis: Secondary | ICD-10-CM | POA: Diagnosis not present

## 2016-06-13 DIAGNOSIS — F329 Major depressive disorder, single episode, unspecified: Secondary | ICD-10-CM | POA: Insufficient documentation

## 2016-06-13 DIAGNOSIS — D649 Anemia, unspecified: Secondary | ICD-10-CM | POA: Diagnosis not present

## 2016-06-13 DIAGNOSIS — Z885 Allergy status to narcotic agent status: Secondary | ICD-10-CM | POA: Diagnosis not present

## 2016-06-13 HISTORY — PX: BREAST LUMPECTOMY WITH RADIOACTIVE SEED AND SENTINEL LYMPH NODE BIOPSY: SHX6550

## 2016-06-13 SURGERY — BREAST LUMPECTOMY WITH RADIOACTIVE SEED AND SENTINEL LYMPH NODE BIOPSY
Anesthesia: General | Site: Breast | Laterality: Left

## 2016-06-13 MED ORDER — CEFAZOLIN SODIUM 1 G IJ SOLR
INTRAMUSCULAR | Status: AC
  Filled 2016-06-13: qty 10

## 2016-06-13 MED ORDER — TECHNETIUM TC 99M SULFUR COLLOID FILTERED
1.0000 | Freq: Once | INTRAVENOUS | Status: AC | PRN
  Administered 2016-06-13: 1 via INTRADERMAL

## 2016-06-13 MED ORDER — DEXAMETHASONE SODIUM PHOSPHATE 10 MG/ML IJ SOLN
INTRAMUSCULAR | Status: AC
  Filled 2016-06-13: qty 1

## 2016-06-13 MED ORDER — ONDANSETRON HCL 4 MG/2ML IJ SOLN: 4.0000 mg | Freq: Four times a day (QID) | INTRAMUSCULAR | Status: DC | PRN

## 2016-06-13 MED ORDER — MIDAZOLAM HCL 2 MG/2ML IJ SOLN
INTRAMUSCULAR | Status: AC
  Filled 2016-06-13: qty 2

## 2016-06-13 MED ORDER — ENOXAPARIN SODIUM 40 MG/0.4ML ~~LOC~~ SOLN: 40.0000 mg | SUBCUTANEOUS | Status: DC

## 2016-06-13 MED ORDER — BUPIVACAINE-EPINEPHRINE 0.25% -1:200000 IJ SOLN
INTRAMUSCULAR | Status: DC | PRN
  Administered 2016-06-13: 20 mL

## 2016-06-13 MED ORDER — SUGAMMADEX SODIUM 200 MG/2ML IV SOLN
INTRAVENOUS | Status: AC
Start: 2016-06-13 — End: 2016-06-13
  Filled 2016-06-13: qty 2

## 2016-06-13 MED ORDER — FENTANYL CITRATE (PF) 100 MCG/2ML IJ SOLN
INTRAMUSCULAR | Status: AC
  Filled 2016-06-13: qty 2

## 2016-06-13 MED ORDER — DEXAMETHASONE SODIUM PHOSPHATE 10 MG/ML IJ SOLN
INTRAMUSCULAR | Status: DC | PRN
  Administered 2016-06-13: 10 mg via INTRAVENOUS

## 2016-06-13 MED ORDER — HEMOSTATIC AGENTS (NO CHARGE) OPTIME
TOPICAL | Status: DC | PRN
  Administered 2016-06-13: 1 via TOPICAL

## 2016-06-13 MED ORDER — MIDAZOLAM HCL 2 MG/2ML IJ SOLN
2.0000 mg | Freq: Once | INTRAMUSCULAR | Status: AC
  Administered 2016-06-13: 2 mg via INTRAVENOUS

## 2016-06-13 MED ORDER — METHOCARBAMOL 500 MG PO TABS
500.0000 mg | ORAL_TABLET | Freq: Four times a day (QID) | ORAL | Status: DC | PRN
  Administered 2016-06-14: 500 mg via ORAL
  Filled 2016-06-13: qty 1

## 2016-06-13 MED ORDER — FENTANYL CITRATE (PF) 100 MCG/2ML IJ SOLN
100.0000 ug | Freq: Once | INTRAMUSCULAR | Status: AC
  Administered 2016-06-13: 100 ug via INTRAVENOUS

## 2016-06-13 MED ORDER — CLONAZEPAM 1 MG PO TABS: 1.0000 mg | ORAL_TABLET | Freq: Every evening | ORAL | Status: DC | PRN

## 2016-06-13 MED ORDER — MIDAZOLAM HCL 2 MG/2ML IJ SOLN
INTRAMUSCULAR | Status: AC
  Administered 2016-06-13: 2 mg via INTRAVENOUS
  Filled 2016-06-13: qty 2

## 2016-06-13 MED ORDER — OXYCODONE HCL 5 MG PO TABS
5.0000 mg | ORAL_TABLET | ORAL | Status: DC | PRN
  Administered 2016-06-13 – 2016-06-14 (×4): 10 mg via ORAL
  Filled 2016-06-13 (×3): qty 2

## 2016-06-13 MED ORDER — AMLODIPINE BESYLATE 10 MG PO TABS
10.0000 mg | ORAL_TABLET | Freq: Every day | ORAL | Status: DC
  Administered 2016-06-14: 10 mg via ORAL
  Filled 2016-06-13: qty 1

## 2016-06-13 MED ORDER — METOPROLOL TARTRATE 25 MG PO TABS
25.0000 mg | ORAL_TABLET | Freq: Two times a day (BID) | ORAL | Status: DC
  Administered 2016-06-14: 25 mg via ORAL
  Filled 2016-06-13: qty 1

## 2016-06-13 MED ORDER — ONDANSETRON HCL 4 MG/2ML IJ SOLN
INTRAMUSCULAR | Status: AC
  Filled 2016-06-13: qty 2

## 2016-06-13 MED ORDER — ACETAMINOPHEN 325 MG PO TABS: 650.0000 mg | ORAL_TABLET | Freq: Four times a day (QID) | ORAL | Status: DC | PRN

## 2016-06-13 MED ORDER — 0.9 % SODIUM CHLORIDE (POUR BTL) OPTIME
TOPICAL | Status: DC | PRN
Start: 2016-06-13 — End: 2016-06-13
  Administered 2016-06-13: 1000 mL

## 2016-06-13 MED ORDER — SODIUM CHLORIDE 0.9 % IV SOLN
INTRAVENOUS | Status: DC
  Administered 2016-06-13: 20:00:00 via INTRAVENOUS

## 2016-06-13 MED ORDER — SODIUM CHLORIDE 0.9 % IJ SOLN
INTRAMUSCULAR | Status: AC
  Filled 2016-06-13: qty 10

## 2016-06-13 MED ORDER — ACETAMINOPHEN 500 MG PO TABS
1000.0000 mg | ORAL_TABLET | ORAL | Status: AC
  Administered 2016-06-13: 1000 mg via ORAL
  Filled 2016-06-13: qty 2

## 2016-06-13 MED ORDER — LIDOCAINE HCL (CARDIAC) 20 MG/ML IV SOLN
INTRAVENOUS | Status: DC | PRN
  Administered 2016-06-13: 60 mg via INTRAVENOUS

## 2016-06-13 MED ORDER — METHYLENE BLUE 0.5 % INJ SOLN
INTRAVENOUS | Status: AC
  Filled 2016-06-13: qty 10

## 2016-06-13 MED ORDER — OXYCODONE HCL 5 MG PO TABS
ORAL_TABLET | ORAL | Status: AC
  Filled 2016-06-13: qty 2

## 2016-06-13 MED ORDER — FENTANYL CITRATE (PF) 100 MCG/2ML IJ SOLN
INTRAMUSCULAR | Status: AC
  Administered 2016-06-13: 100 ug via INTRAVENOUS
  Filled 2016-06-13: qty 2

## 2016-06-13 MED ORDER — HYDROXYCHLOROQUINE SULFATE 200 MG PO TABS
200.0000 mg | ORAL_TABLET | Freq: Every day | ORAL | Status: DC
  Administered 2016-06-14: 200 mg via ORAL
  Filled 2016-06-13: qty 1

## 2016-06-13 MED ORDER — ROCURONIUM BROMIDE 100 MG/10ML IV SOLN
INTRAVENOUS | Status: DC | PRN
  Administered 2016-06-13: 60 mg via INTRAVENOUS

## 2016-06-13 MED ORDER — FENTANYL CITRATE (PF) 100 MCG/2ML IJ SOLN
INTRAMUSCULAR | Status: DC | PRN
  Administered 2016-06-13: 100 ug via INTRAVENOUS

## 2016-06-13 MED ORDER — BUPROPION HCL ER (SR) 150 MG PO TB12
150.0000 mg | ORAL_TABLET | Freq: Every morning | ORAL | Status: DC
  Administered 2016-06-14: 150 mg via ORAL
  Filled 2016-06-13: qty 1

## 2016-06-13 MED ORDER — PROPOFOL 10 MG/ML IV BOLUS
INTRAVENOUS | Status: DC | PRN
  Administered 2016-06-13: 200 mg via INTRAVENOUS

## 2016-06-13 MED ORDER — MORPHINE SULFATE (PF) 2 MG/ML IV SOLN: 1.0000 mg | INTRAVENOUS | Status: DC | PRN

## 2016-06-13 MED ORDER — BUPIVACAINE-EPINEPHRINE (PF) 0.25% -1:200000 IJ SOLN
INTRAMUSCULAR | Status: AC
  Filled 2016-06-13: qty 30

## 2016-06-13 MED ORDER — ONDANSETRON 4 MG PO TBDP: 4.0000 mg | ORAL_TABLET | Freq: Four times a day (QID) | ORAL | Status: DC | PRN

## 2016-06-13 MED ORDER — PROMETHAZINE HCL 25 MG/ML IJ SOLN: 6.2500 mg | INTRAMUSCULAR | Status: DC | PRN

## 2016-06-13 MED ORDER — IRBESARTAN 75 MG PO TABS
75.0000 mg | ORAL_TABLET | Freq: Every day | ORAL | Status: DC
  Administered 2016-06-14: 75 mg via ORAL
  Filled 2016-06-13: qty 1

## 2016-06-13 MED ORDER — SUGAMMADEX SODIUM 200 MG/2ML IV SOLN
INTRAVENOUS | Status: DC | PRN
  Administered 2016-06-13: 300 mg via INTRAVENOUS

## 2016-06-13 MED ORDER — MEPERIDINE HCL 25 MG/ML IJ SOLN: 6.2500 mg | INTRAMUSCULAR | Status: DC | PRN

## 2016-06-13 MED ORDER — SIMETHICONE 80 MG PO CHEW: 40.0000 mg | CHEWABLE_TABLET | Freq: Four times a day (QID) | ORAL | Status: DC | PRN

## 2016-06-13 MED ORDER — LACTATED RINGERS IV SOLN
INTRAVENOUS | Status: DC
  Administered 2016-06-13: 14:00:00 via INTRAVENOUS

## 2016-06-13 MED ORDER — ACETAMINOPHEN 650 MG RE SUPP: 650.0000 mg | Freq: Four times a day (QID) | RECTAL | Status: DC | PRN

## 2016-06-13 MED ORDER — CEFAZOLIN SODIUM-DEXTROSE 2-4 GM/100ML-% IV SOLN
2.0000 g | INTRAVENOUS | Status: AC
  Administered 2016-06-13: 2 g via INTRAVENOUS
  Filled 2016-06-13: qty 100

## 2016-06-13 MED ORDER — LIDOCAINE 2% (20 MG/ML) 5 ML SYRINGE
INTRAMUSCULAR | Status: AC
  Filled 2016-06-13: qty 5

## 2016-06-13 MED ORDER — HYDROMORPHONE HCL 1 MG/ML IJ SOLN
INTRAMUSCULAR | Status: AC
  Filled 2016-06-13: qty 1

## 2016-06-13 MED ORDER — SUGAMMADEX SODIUM 200 MG/2ML IV SOLN
INTRAVENOUS | Status: AC
  Filled 2016-06-13: qty 2

## 2016-06-13 MED ORDER — CEFAZOLIN SODIUM 1 G IJ SOLR
INTRAMUSCULAR | Status: DC | PRN
  Administered 2016-06-13: 1 g via INTRAMUSCULAR

## 2016-06-13 MED ORDER — GABAPENTIN 300 MG PO CAPS
300.0000 mg | ORAL_CAPSULE | ORAL | Status: AC
  Administered 2016-06-13: 300 mg via ORAL
  Filled 2016-06-13: qty 1

## 2016-06-13 MED ORDER — HYDROMORPHONE HCL 1 MG/ML IJ SOLN
0.2500 mg | INTRAMUSCULAR | Status: DC | PRN
  Administered 2016-06-13 (×3): 0.5 mg via INTRAVENOUS

## 2016-06-13 MED ORDER — BUPIVACAINE-EPINEPHRINE (PF) 0.5% -1:200000 IJ SOLN
INTRAMUSCULAR | Status: DC | PRN
  Administered 2016-06-13: 30 mL

## 2016-06-13 MED ORDER — ONDANSETRON HCL 4 MG/2ML IJ SOLN
INTRAMUSCULAR | Status: DC | PRN
  Administered 2016-06-13: 4 mg via INTRAVENOUS

## 2016-06-13 MED ORDER — TRIAMTERENE-HCTZ 37.5-25 MG PO TABS
1.0000 | ORAL_TABLET | Freq: Every day | ORAL | Status: DC
  Administered 2016-06-14: 1 via ORAL
  Filled 2016-06-13: qty 1

## 2016-06-13 MED ORDER — ALBUTEROL SULFATE (2.5 MG/3ML) 0.083% IN NEBU: 2.5000 mg | INHALATION_SOLUTION | Freq: Four times a day (QID) | RESPIRATORY_TRACT | Status: DC | PRN

## 2016-06-13 MED ORDER — LIDOCAINE 2% (20 MG/ML) 5 ML SYRINGE
INTRAMUSCULAR | Status: AC
Start: 2016-06-13 — End: 2016-06-13
  Filled 2016-06-13: qty 5

## 2016-06-13 SURGICAL SUPPLY — 49 items
APPLIER CLIP 9.375 MED OPEN (MISCELLANEOUS) ×2
BINDER BREAST XLRG (GAUZE/BANDAGES/DRESSINGS) ×2 IMPLANT
BLADE SURG 10 STRL SS (BLADE) ×2 IMPLANT
BLADE SURG 15 STRL LF DISP TIS (BLADE) ×1 IMPLANT
BLADE SURG 15 STRL SS (BLADE) ×1
CANISTER SUCTION 2500CC (MISCELLANEOUS) ×2 IMPLANT
CHLORAPREP W/TINT 26ML (MISCELLANEOUS) ×2 IMPLANT
CLIP APPLIE 9.375 MED OPEN (MISCELLANEOUS) ×1 IMPLANT
CONT SPEC 4OZ CLIKSEAL STRL BL (MISCELLANEOUS) ×2 IMPLANT
COVER PROBE W GEL 5X96 (DRAPES) ×2 IMPLANT
COVER SURGICAL LIGHT HANDLE (MISCELLANEOUS) ×2 IMPLANT
DRAPE CHEST BREAST 15X10 FENES (DRAPES) ×2 IMPLANT
DRAPE UTILITY XL STRL (DRAPES) ×2 IMPLANT
ELECT COATED BLADE 2.86 ST (ELECTRODE) ×2 IMPLANT
ELECT REM PT RETURN 9FT ADLT (ELECTROSURGICAL) ×2
ELECTRODE REM PT RTRN 9FT ADLT (ELECTROSURGICAL) ×1 IMPLANT
GLOVE BIO SURGEON STRL SZ7 (GLOVE) ×4 IMPLANT
GLOVE BIOGEL PI IND STRL 7.5 (GLOVE) ×1 IMPLANT
GLOVE BIOGEL PI INDICATOR 7.5 (GLOVE) ×1
GOWN STRL REUS W/ TWL LRG LVL3 (GOWN DISPOSABLE) ×2 IMPLANT
GOWN STRL REUS W/TWL LRG LVL3 (GOWN DISPOSABLE) ×2
ILLUMINATOR WAVEGUIDE N/F (MISCELLANEOUS) ×2 IMPLANT
KIT BASIN OR (CUSTOM PROCEDURE TRAY) ×2 IMPLANT
KIT MARKER MARGIN INK (KITS) ×2 IMPLANT
LIQUID BAND (GAUZE/BANDAGES/DRESSINGS) ×2 IMPLANT
MARKER SKIN DUAL TIP RULER LAB (MISCELLANEOUS) ×2 IMPLANT
NEEDLE HYPO 25GX1X1/2 BEV (NEEDLE) ×2 IMPLANT
NEEDLE HYPO 25X1 1.5 SAFETY (NEEDLE) ×2 IMPLANT
NS IRRIG 1000ML POUR BTL (IV SOLUTION) ×2 IMPLANT
PACK SURGICAL SETUP 50X90 (CUSTOM PROCEDURE TRAY) ×2 IMPLANT
PENCIL BUTTON HOLSTER BLD 10FT (ELECTRODE) ×2 IMPLANT
SPONGE LAP 18X18 X RAY DECT (DISPOSABLE) ×4 IMPLANT
STRIP CLOSURE SKIN 1/2X4 (GAUZE/BANDAGES/DRESSINGS) ×2 IMPLANT
SUT MNCRL AB 4-0 PS2 18 (SUTURE) ×6 IMPLANT
SUT SILK 2 0 SH (SUTURE) IMPLANT
SUT SILK 3 0 (SUTURE) ×1
SUT SILK 3 0 SH CR/8 (SUTURE) ×2 IMPLANT
SUT SILK 3-0 18XBRD TIE 12 (SUTURE) ×1 IMPLANT
SUT VIC AB 2-0 SH 18 (SUTURE) ×2 IMPLANT
SUT VIC AB 2-0 SH 27 (SUTURE) ×2
SUT VIC AB 2-0 SH 27XBRD (SUTURE) ×2 IMPLANT
SUT VIC AB 3-0 SH 27 (SUTURE) ×2
SUT VIC AB 3-0 SH 27X BRD (SUTURE) ×2 IMPLANT
SYR BULB 3OZ (MISCELLANEOUS) ×2 IMPLANT
SYR CONTROL 10ML LL (SYRINGE) ×2 IMPLANT
TOWEL OR 17X24 6PK STRL BLUE (TOWEL DISPOSABLE) ×2 IMPLANT
TOWEL OR 17X26 10 PK STRL BLUE (TOWEL DISPOSABLE) ×2 IMPLANT
TUBE CONNECTING 12X1/4 (SUCTIONS) ×4 IMPLANT
YANKAUER SUCT BULB TIP NO VENT (SUCTIONS) ×4 IMPLANT

## 2016-06-13 NOTE — Anesthesia Procedure Notes (Signed)
Procedure Name: Intubation Date/Time: 06/13/2016 4:49 PM Performed by: Myna Bright Pre-anesthesia Checklist: Patient identified, Emergency Drugs available, Suction available and Patient being monitored Patient Re-evaluated:Patient Re-evaluated prior to inductionOxygen Delivery Method: Circle system utilized Preoxygenation: Pre-oxygenation with 100% oxygen Intubation Type: IV induction Ventilation: Mask ventilation without difficulty Laryngoscope Size: Mac and 3 Grade View: Grade I Tube type: Oral Tube size: 7.5 mm Number of attempts: 1 Airway Equipment and Method: Stylet Placement Confirmation: ETT inserted through vocal cords under direct vision,  positive ETCO2 and breath sounds checked- equal and bilateral Secured at: 21 cm Tube secured with: Tape Dental Injury: Teeth and Oropharynx as per pre-operative assessment

## 2016-06-13 NOTE — H&P (Signed)
71 yof who works at [employer] presents in consultation from Dr Everlean Alstrom. she had noted aleft breast uiq mass. she has no prior breast history and has no family history of breast or ovarian cancer. some possible prior trauma. her density is b. she had on left side uiq a 4.9x2.4x2.7 cm. she had 3D guided biopsy with clip placement. the right breast is normal. targeted US shows no mass in this area just some subtle fat stranding. US of the left axilla is negative for adenopathy. she underwent biopsy of two of these separate areas. they are both er/pr pos her2 negative low Ki IDC. she is here today to discuss options.   Other Problems Lars Mage Spillers, CMA; 05/18/2016 10:38 AM) Anxiety Disorder Arthritis Back Pain Breast Cancer High blood pressure Lump In Breast  Past Surgical History Lars Mage Spillers, CMA; 05/18/2016 10:38 AM) Breast Biopsy Left. Cesarean Section - Multiple Hysterectomy (not due to cancer) - Complete Oral Surgery  Diagnostic Studies History Illene Regulus, CMA; 05/18/2016 10:38 AM) Colonoscopy 1-5 years ago Mammogram within last year  Allergies Lars Mage Spillers, CMA; 05/18/2016 10:40 AM) Morphine Sulfate (PF) *ANALGESICS - OPIOID*  Medication History (Alisha Spillers, CMA; 05/18/2016 10:45 AM) ClonazePAM (1MG Tablet, Oral) Active. Hydrocodone-Acetaminophen (10-325MG Tablet, Oral) Active. Lyrica (75MG Capsule, Oral) Active. Triamterene-HCTZ (37.5-25MG Tablet, Oral) Active. BuPROPion HCl ER (SR) (150MG Tablet ER 12HR, Oral) Active. Valsartan (320MG Tablet, Oral) Active. AmLODIPine Besylate (10MG Tablet, Oral) Active. Metoprolol Tartrate (25MG Tablet, Oral) Active. Hydroxychloroquine Sulfate (200MG Tablet, Oral) Active. Zolpidem Tartrate (10MG Tablet, Oral) Active. Cyclobenzaprine HCl (10MG Tablet, Oral) Active. Baby Aspirin (81MG Tablet Chewable, Oral) Active. Fish Oil (1000MG Capsule, Oral) Active. B12-Active  (1MG Tablet Chewable, Oral) Active. Medications Reconciled  Social History Illene Regulus, CMA; 05/18/2016 10:38 AM) Caffeine use Coffee, Tea. No alcohol use No drug use Tobacco use Never smoker.  Family History Illene Regulus, CMA; 05/18/2016 10:38 AM) Arthritis Father, Mother, Sister. Heart Disease Father. Heart disease in female family member before age 79 Hypertension Father, Mother, Sister.  Pregnancy / Birth History Illene Regulus, CMA; 05/18/2016 10:38 AM) Age at menarche 21 years. Age of menopause [age] Contraceptive History Oral contraceptives. Gravida 5 Length (months) of breastfeeding 3-6 Maternal age 41-25 Para 3    Review of Systems Lars Mage Spillers CMA; 05/18/2016 10:38 AM) General Present- Fatigue and Weight Gain. Not Present- Appetite Loss, Chills, Fever, Night Sweats and Weight Loss. Skin Not Present- Change in Wart/Mole, Dryness, Hives, Jaundice, New Lesions, Non-Healing Wounds, Rash and Ulcer. Breast Present- Breast Mass and Breast Pain. Not Present- Nipple Discharge and Skin Changes. Cardiovascular Present- Shortness of Breath and Swelling of Extremities. Not Present- Chest Pain, Difficulty Breathing Lying Down, Leg Cramps, Palpitations and Rapid Heart Rate. Gastrointestinal Present- Constipation and Hemorrhoids. Not Present- Abdominal Pain, Bloating, Bloody Stool, Change in Bowel Habits, Chronic diarrhea, Difficulty Swallowing, Excessive gas, Gets full quickly at meals, Indigestion, Nausea, Rectal Pain and Vomiting. Female Genitourinary Present- Frequency and Urgency. Not Present- Nocturia, Painful Urination and Pelvic Pain. Musculoskeletal Present- Back Pain and Joint Pain. Not Present- Joint Stiffness, Muscle Pain, Muscle Weakness and Swelling of Extremities. Neurological Not Present- Decreased Memory, Fainting, Headaches, Numbness, Seizures, Tingling, Tremor, Trouble walking and Weakness. Psychiatric Present- Anxiety. Not Present- Bipolar, Change  in Sleep Pattern, Depression, Fearful and Frequent crying. Endocrine Not Present- Cold Intolerance, Excessive Hunger, Hair Changes, Heat Intolerance, Hot flashes and New Diabetes. Hematology Not Present- Blood Thinners, Easy Bruising, Excessive bleeding, Gland problems, HIV and Persistent Infections.  Vitals Lars Mage Spillers CMA; 05/18/2016 10:39  AM) 05/18/2016 10:38 AM Weight: 289.4 lb Height: 60in Body Surface Area: 2.18 m Body Mass Index: 56.52 kg/m  Pulse: 90 (Regular)  BP: 136/82 (Sitting, Left Arm, Standard)       Physical Exam Rolm Bookbinder MD; 05/18/2016 10:50 AM) General Mental Status-Alert. Orientation-Oriented X3.  Chest and Lung Exam Chest and lung exam reveals -on auscultation, normal breath sounds, no adventitious sounds and normal vocal resonance.  Breast Nipples-No Discharge. Note: 4 cm uiq left breast mass with associated hematoma after biopsy    Cardiovascular Cardiovascular examination reveals -normal heart sounds, regular rate and rhythm with no murmurs.  Lymphatic Head & Neck  General Head & Neck Lymphatics: Bilateral - Description - Normal. Axillary  General Axillary Region: Bilateral - Description - Normal. Note: no [place] adenopathy     Assessment & Plan Rolm Bookbinder MD; 05/18/2016 1:12 PM) BREAST CANCER OF UPPER-INNER QUADRANT OF LEFT FEMALE BREAST (C50.212) Story: left breast seed bracketed lumpectomy, left ax sn biopsy We discussed the staging and pathophysiology of breast cancer. We discussed all of the different options for treatment for breast cancer including surgery, chemotherapy, radiation therapy, Herceptin, and antiestrogen therapy. We discussed a sentinel lymph node biopsy as she does not appear to having lymph node involvement right now. We discussed the performance of that with injection of radioactive tracer. We discussed that there is a chance of having a positive node with a sentinel lymph node biopsy and  we will await the permanent pathology to make any other first further decisions in terms of her treatment. One of these options might be to return to the operating room to perform an axillary lymph node dissection. We discussed up to a 5% risk lifetime of chronic shoulder pain as well as lymphedema associated with a sentinel lymph node biopsy. We discussed the options for treatment of the breast cancer which included lumpectomy versus a mastectomy. We discussed the performance of the lumpectomy with radioactive seed placement. We discussed a 5-10% chance of a positive margin requiring reexcision in the operating room. We also discussed that she will likely need radiation therapy (this is usually 5-7 weeks) if she undergoes lumpectomy. We discussed the mastectomy (removal of whole breast) and the postoperative care for that as well. The decision for lumpectomy vs mastectomy has no impact on decision for chemotherapy. Most mastectomy patients will not need radiation therapy. We discussed that there is no difference in her survival whether she undergoes lumpectomy with radiation therapy or antiestrogen therapy versus a mastectomy. I think she would be good candidate for reduction lumpectoy and she is very interested in this. will discuss after seeing plastic surgery

## 2016-06-13 NOTE — Transfer of Care (Signed)
Immediate Anesthesia Transfer of Care Note  Patient: Debra Griffith  Procedure(s) Performed: Procedure(s): LEFT BREAST LUMPECTOMY WITH BRACKETED  RADIOACTIVE SEED AND SENTINEL LYMPH NODE BIOPSY (Left)  Patient Location: PACU  Anesthesia Type:GA combined with regional for post-op pain  Level of Consciousness: awake, alert , oriented and patient cooperative  Airway & Oxygen Therapy: Patient Spontanous Breathing and Patient connected to nasal cannula oxygen  Post-op Assessment: Report given to RN, Post -op Vital signs reviewed and stable and Patient moving all extremities  Post vital signs: Reviewed and stable  Last Vitals:  Vitals:   06/13/16 1515 06/13/16 1803  BP: (!) 105/30 (!) 113/59  Pulse: 71 65  Resp: 13 16  Temp:  36.6 C    Last Pain:  Vitals:   06/13/16 1400  TempSrc: Oral         Complications: No apparent anesthesia complications

## 2016-06-13 NOTE — Discharge Instructions (Signed)
[facility] °Office Phone Number [phone] °POST OP INSTRUCTIONS ° °Always review your discharge instruction sheet given to you by the facility where your surgery was performed. ° °IF YOU HAVE DISABILITY OR FAMILY LEAVE FORMS, YOU MUST BRING THEM TO THE OFFICE FOR PROCESSING.  DO NOT GIVE THEM TO YOUR DOCTOR. ° °1. A prescription for pain medication may be given to you upon discharge.  Take your pain medication as prescribed, if needed.  If narcotic pain medicine is not needed, then you may take acetaminophen (Tylenol), naprosyn (Alleve) or ibuprofen (Advil) as needed. °2. Take your usually prescribed medications unless otherwise directed °3. If you need a refill on your pain medication, please contact your pharmacy.  They will contact our office to request authorization.  Prescriptions will not be filled after 5pm or on week-ends. °4. You should eat very light the first 24 hours after surgery, such as soup, crackers, pudding, etc.  Resume your normal diet the day after surgery. °5. Most patients will experience some swelling and bruising in the breast.  Ice packs and a good support bra will help.  Wear the breast binder provided or a sports bra for 72 hours day and night.  After that wear a sports bra during the day until you return to the office. Swelling and bruising can take several days to resolve.  °6. It is common to experience some constipation if taking pain medication after surgery.  Increasing fluid intake and taking a stool softener will usually help or prevent this problem from occurring.  A mild laxative (Milk of Magnesia or Miralax) should be taken according to package directions if there are no bowel movements after 48 hours. °7. Unless discharge instructions indicate otherwise, you may remove your bandages 48 hours after surgery and you may shower at that time.  You may have steri-strips (small skin tapes) in place directly over the incision.  These strips should be left on the  skin for 7-10 days and will come off on their own.  If your surgeon used skin glue on the incision, you may shower in 24 hours.  The glue will flake off over the next 2-3 weeks.  Any sutures or staples will be removed at the office during your follow-up visit. °8. ACTIVITIES:  You may resume regular daily activities (gradually increasing) beginning the next day.  Wearing a good support bra or sports bra minimizes pain and swelling.  You may have sexual intercourse when it is comfortable. °a. You may drive when you no longer are taking prescription pain medication, you can comfortably wear a seatbelt, and you can safely maneuver your car and apply brakes. °b. RETURN TO WORK:  ______________________________________________________________________________________ °9. You should see your doctor in the office for a follow-up appointment approximately two weeks after your surgery.  Your doctor’s nurse will typically make your follow-up appointment when she calls you with your pathology report.  Expect your pathology report 3-4 business days after your surgery.  You may call to check if you do not hear from us after three days. °10. OTHER INSTRUCTIONS: _______________________________________________________________________________________________ _____________________________________________________________________________________________________________________________________ °_____________________________________________________________________________________________________________________________________ °_____________________________________________________________________________________________________________________________________ ° °WHEN TO CALL DR Thurston Brendlinger: °1. Fever over 101.0 °2. Nausea and/or vomiting. °3. Extreme swelling or bruising. °4. Continued bleeding from incision. °5. Increased pain, redness, or drainage from the incision. ° °The clinic staff is available to answer your questions during regular  business hours.  Please don’t hesitate to call and ask to speak to one of the nurses for clinical concerns.  If you   have a medical emergency, go to the nearest emergency room or call 911.  A surgeon from [facility] is always on call at the hospital. ° °For further questions, please visit [email or web address] ° °

## 2016-06-13 NOTE — Anesthesia Preprocedure Evaluation (Signed)
Anesthesia Evaluation    History of Anesthesia Complications (+) PONV and history of anesthetic complications  Airway Mallampati: II  TM Distance: >3 FB Neck ROM: Full    Dental no notable dental hx.    Pulmonary    Pulmonary exam normal breath sounds clear to auscultation       Cardiovascular hypertension, Normal cardiovascular exam+ dysrhythmias  Rhythm:Regular Rate:Normal     Neuro/Psych PSYCHIATRIC DISORDERS Anxiety Depression    GI/Hepatic GERD  ,  Endo/Other    Renal/GU      Musculoskeletal  (+) Arthritis ,   Abdominal   Peds  Hematology  (+) anemia ,   Anesthesia Other Findings   Reproductive/Obstetrics                                                             Anesthesia Evaluation  Patient identified by MRN, date of birth, ID band Patient awake    Reviewed: Allergy & Precautions, H&P , NPO status , Patient's Chart, lab work & pertinent test results, reviewed documented beta blocker date and time   History of Anesthesia Complications (+) PONV and history of anesthetic complications  Airway Mallampati: II TM Distance: >3 FB Neck ROM: Full    Dental no notable dental hx.    Pulmonary neg pulmonary ROS,  breath sounds clear to auscultation  Pulmonary exam normal       Cardiovascular hypertension, Pt. on medications and Pt. on home beta blockers + dysrhythmias (RBBB) Rhythm:Irregular Rate:Normal - Systolic murmurs    Neuro/Psych PSYCHIATRIC DISORDERS Anxiety Depression negative neurological ROS     GI/Hepatic Neg liver ROS, GERD-  Medicated and Controlled,  Endo/Other  negative endocrine ROSMorbid obesity  Renal/GU negative Renal ROS  negative genitourinary   Musculoskeletal  (+) Arthritis -,   Abdominal   Peds negative pediatric ROS (+)  Hematology  (+) anemia ,   Anesthesia Other Findings   Reproductive/Obstetrics negative OB ROS                           Anesthesia Physical Anesthesia Plan  ASA: III  Anesthesia Plan: Spinal   Post-op Pain Management:    Induction: Intravenous  Airway Management Planned: Nasal Cannula  Additional Equipment:   Intra-op Plan:   Post-operative Plan:   Informed Consent: I have reviewed the patients History and Physical, chart, labs and discussed the procedure including the risks, benefits and alternatives for the proposed anesthesia with the patient or authorized representative who has indicated his/her understanding and acceptance.   Dental advisory given  Plan Discussed with: CRNA  Anesthesia Plan Comments:         Anesthesia Quick Evaluation  Anesthesia Physical Anesthesia Plan  ASA: III  Anesthesia Plan: General   Post-op Pain Management: GA combined w/ Regional for post-op pain   Induction: Intravenous  Airway Management Planned: Oral ETT  Additional Equipment:   Intra-op Plan:   Post-operative Plan: Extubation in OR  Informed Consent: I have reviewed the patients History and Physical, chart, labs and discussed the procedure including the risks, benefits and alternatives for the proposed anesthesia with the patient or authorized representative who has indicated his/her understanding and acceptance.   Dental advisory given  Plan Discussed with: CRNA  Anesthesia Plan Comments:  Anesthesia Quick Evaluation  

## 2016-06-13 NOTE — Interval H&P Note (Signed)
History and Physical Interval Note:  06/13/2016 4:35 PM  Marijo Conception  has presented today for surgery, with the diagnosis of LEFT BREAST CANCER  The various methods of treatment have been discussed with the patient and family. After consideration of risks, benefits and other options for treatment, the patient has consented to  Procedure(s): LEFT BREAST LUMPECTOMY WITH BRACKETED  RADIOACTIVE SEED AND SENTINEL LYMPH NODE BIOPSY (Left) as a surgical intervention .  The patient's history has been reviewed, patient examined, no change in status, stable for surgery.  I have reviewed the patient's chart and labs.  Questions were answered to the patient's satisfaction.     Matrice Herro

## 2016-06-13 NOTE — Interval H&P Note (Signed)
History and Physical Interval Note:  06/13/2016 4:40 PM  Debra Griffith  has presented today for surgery, with the diagnosis of LEFT BREAST CANCER  The various methods of treatment have been discussed with the patient and family. After consideration of risks, benefits and other options for treatment, the patient has consented to  Procedure(s): LEFT BREAST LUMPECTOMY WITH BRACKETED  RADIOACTIVE SEED AND SENTINEL LYMPH NODE BIOPSY (Left) as a surgical intervention .  The patient's history has been reviewed, patient examined, no change in status, stable for surgery.  I have reviewed the patient's chart and labs.  Questions were answered to the patient's satisfaction.     [name]

## 2016-06-13 NOTE — Op Note (Signed)
Preoperative diagnosis: left breast cancer clinical stage II Postoperative diagnosis: same as above Procedure: 1. Left breast seed bracketed guided lumpectomy  2. Leftaxillary sentinel nodebiopsy Surgeon Dr Serita Grammes Anes general with pectoral block EBL: minimal Comps none Specimen:  1. Leftbreast marked with paint 2. Leftaxillary nodes with highest count of 137 Sponge count correct at completion dispo to recovery stable  Indications: This is a 40 yof with clinical stage II left breast cancer. We discussed options and elected to perform seed bracketed lumpectomy with sn biopsy. She will undergo reduction a week later if all goes well.She had two seeds placed prior to surgery and I had her mm in the OR. She was marked by Dr Iran Planas prior to surgery and we discussed incision placement.   Procedure: After informed consent was obtained the patient was taken to the OR. She was injected with technetium in the standard periareolar fashion. She underwent a pectoral block. She was given anitibiotics. SCDs were in place. She was prepped and draped in the standard sterile surgical fashion. A timeout was performed. I then located the seeds in the left upper inner quadrant. I infiltrated marcaine over this area. I used the incision for the reduction and used the lighted retractors to tunnel to the lesion. I then removed the seeds with an attempt to get a clear margin. This was passed off the table after marking with paint. The deep margin is the muscle.. I did confirm removal of theclipand seeds with mammography.I placed clips in the cavity.I closed this with 2-0 vicryl, 3-0 vicryl and 4-0 monocryl. Glue was placed I then made an axillary incisionI went through the axillary fascia.  I identified the sentinel nodes with the highest count as above. There was minimal background radioactivity. I obtained hemostasis. I then closed the axilaryfascia with 2-0 vicryl.I closed the skin with  3-0 vicryl and 4-0 monocryl. Glue and steristrips were placed. A breast binder was placed. She was extubated and transferred to recovery stable

## 2016-06-13 NOTE — Anesthesia Procedure Notes (Signed)
Anesthesia Regional Block:  Pectoralis block  Pre-Anesthetic Checklist: ,, timeout performed, Correct Patient, Correct Site, Correct Laterality, Correct Procedure, Correct Position, site marked, Risks and benefits discussed, Surgical consent,  Pre-op evaluation,  Post-op pain management  Laterality: Left  Prep: chloraprep       Needles:   Needle Type: Stimiplex     Needle Length: 9cm 9 cm     Additional Needles:  Procedures: ultrasound guided (picture in chart) Pectoralis block Narrative:  Injection made incrementally with aspirations every 5 mL.  Performed by: Personally  Anesthesiologist: Nolon Nations  Additional Notes: Patient tolerated well. Good fascial spread noted.

## 2016-06-14 ENCOUNTER — Encounter (HOSPITAL_COMMUNITY): Payer: Self-pay | Admitting: General Surgery

## 2016-06-14 DIAGNOSIS — N6092 Unspecified benign mammary dysplasia of left breast: Secondary | ICD-10-CM | POA: Diagnosis not present

## 2016-06-14 DIAGNOSIS — F329 Major depressive disorder, single episode, unspecified: Secondary | ICD-10-CM | POA: Diagnosis not present

## 2016-06-14 DIAGNOSIS — K219 Gastro-esophageal reflux disease without esophagitis: Secondary | ICD-10-CM | POA: Diagnosis not present

## 2016-06-14 DIAGNOSIS — I10 Essential (primary) hypertension: Secondary | ICD-10-CM | POA: Diagnosis not present

## 2016-06-14 DIAGNOSIS — C50212 Malignant neoplasm of upper-inner quadrant of left female breast: Secondary | ICD-10-CM | POA: Diagnosis not present

## 2016-06-14 DIAGNOSIS — F419 Anxiety disorder, unspecified: Secondary | ICD-10-CM | POA: Diagnosis not present

## 2016-06-14 NOTE — Discharge Summary (Signed)
Physician Discharge Summary  Patient ID: Debra Griffith MRN: [id number] DOB/AGE: May 05, 1941 75 y.o.  Admit date: 06/13/2016 Discharge date: 06/14/2016  Admission Diagnoses: Left breast cancer Hypertension  Discharge Diagnoses:  Active Problems:   Breast cancer, female, left   Discharged Condition: good  Hospital Course: 59 yof with multiple medical issues admitted for left breast cancer. She underwent seed bracketed lumpectomy with left axillary sentinel node biopsy. She has done well and will be discharged home.   Consults: None  Significant Diagnostic Studies: none  Treatments: surgery: left breast bracketed lumpectomy, left ax sn biopsy  Discharge Exam: Blood pressure 129/60, pulse 73, temperature 98.2 F (36.8 C), temperature source Oral, resp. rate 18, height 5' (1.524 m), weight 133.5 kg (294 lb 6.4 oz), SpO2 95 %. Lungs clear cv rrr Left breast incision clean and left axillary incision clean without infection  Disposition: home    Medication List    TAKE these medications   acetaminophen 325 MG tablet Commonly known as:  TYLENOL Take 2 tablets (650 mg total) by mouth every 6 (six) hours as needed for mild pain (or Fever >/= 101).   albuterol 108 (90 Base) MCG/ACT inhaler Commonly known as:  PROVENTIL HFA;VENTOLIN HFA Inhale 2 puffs into the lungs every 6 (six) hours as needed. Wheezing and shortness of breath   amLODipine 10 MG tablet Commonly known as:  NORVASC Take 10 mg by mouth daily.   anastrozole 1 MG tablet Commonly known as:  ARIMIDEX Take 1 tablet (1 mg total) by mouth daily.   aspirin EC 81 MG tablet Take 81 mg by mouth daily. Will stop prior to surgery   B-12 500 MCG Tabs Take 500 mcg by mouth daily.   bisacodyl 10 MG suppository Commonly known as:  DULCOLAX Place 1 suppository (10 mg total) rectally daily as needed for moderate constipation.   buPROPion 150 MG 12 hr tablet Commonly known as:  WELLBUTRIN SR Take 150 mg by mouth every  morning.   cholecalciferol 1000 units tablet Commonly known as:  VITAMIN D Take 1,000 Units by mouth daily.   clonazePAM 1 MG tablet Commonly known as:  KLONOPIN Take 1 mg by mouth at bedtime.   cyclobenzaprine 10 MG tablet Commonly known as:  FLEXERIL Take 20 mg by mouth at bedtime.   Fish Oil 1000 MG Caps Take 1,000 mg by mouth daily.   HYDROcodone-acetaminophen 10-325 MG tablet Commonly known as:  NORCO Take 1 tablet by mouth 4 (four) times daily.   hydroxychloroquine 200 MG tablet Commonly known as:  PLAQUENIL Take 200 mg by mouth daily.   MAGNESIUM PO Take 2 tablets by mouth daily.   methocarbamol 500 MG tablet Commonly known as:  ROBAXIN Take 1 tablet (500 mg total) by mouth every 6 (six) hours as needed for muscle spasms.   metoCLOPramide 5 MG tablet Commonly known as:  REGLAN Take 1-2 tablets (5-10 mg total) by mouth every 8 (eight) hours as needed for nausea (if ondansetron (ZOFRAN) ineffective.).   metoprolol tartrate 25 MG tablet Commonly known as:  LOPRESSOR Take 25 mg by mouth 2 (two) times daily.   triamterene-hydrochlorothiazide 37.5-25 MG tablet Commonly known as:  MAXZIDE-25 Take 1 tablet by mouth daily.   Turmeric 500 MG Caps Take 500 mg by mouth 2 (two) times daily.   valsartan 320 MG tablet Commonly known as:  DIOVAN Take 320 mg by mouth daily.   vitamin C 500 MG tablet Commonly known as:  ASCORBIC ACID Take 1,000 mg by mouth  daily.   zolpidem 10 MG tablet Commonly known as:  AMBIEN Take 10 mg by mouth at bedtime.      Follow-up Information    Ketura Sirek, MD In 2 weeks.   Specialty:  General Surgery Contact information: [address] [place] [address] [phone]           Signed: Rolm Bookbinder 06/14/2016, 6:32 AM

## 2016-06-14 NOTE — Discharge Planning (Signed)
AVS tp patient who verbalizes understanding. Will dc to private car home with all personal belongings accompanied by husband.

## 2016-06-14 NOTE — Anesthesia Postprocedure Evaluation (Signed)
Anesthesia Post Note  Patient: Debra Griffith  Procedure(s) Performed: Procedure(s) (LRB): LEFT BREAST LUMPECTOMY WITH BRACKETED  RADIOACTIVE SEED AND SENTINEL LYMPH NODE BIOPSY (Left)  Patient location during evaluation: PACU Anesthesia Type: General Level of consciousness: awake and alert Pain management: pain level controlled Vital Signs Assessment: post-procedure vital signs reviewed and stable Respiratory status: spontaneous breathing, nonlabored ventilation, respiratory function stable and patient connected to nasal cannula oxygen Cardiovascular status: blood pressure returned to baseline and stable Postop Assessment: no signs of nausea or vomiting Anesthetic complications: no    Last Vitals:  Vitals:   06/14/16 0212 06/14/16 0458  BP: (!) 119/57 129/60  Pulse: 73 73  Resp: 18 18  Temp: 36.4 C 36.8 C    Last Pain:  Vitals:   06/14/16 0900  TempSrc:   PainSc: [place]

## 2016-06-15 ENCOUNTER — Ambulatory Visit: Payer: Medicare Other | Admitting: Radiation Oncology

## 2016-06-15 ENCOUNTER — Ambulatory Visit: Payer: Medicare Other

## 2016-06-16 ENCOUNTER — Telehealth: Payer: Self-pay

## 2016-06-16 NOTE — Telephone Encounter (Signed)
Faxed fmla paperwork to [employer]

## 2016-06-19 ENCOUNTER — Telehealth: Payer: Self-pay | Admitting: *Deleted

## 2016-06-19 NOTE — Telephone Encounter (Signed)
Ordered oncotype per Dr. Magrinat.  Faxed requisition to pathology and confirmed receipt.  Faxed PA to [employer]. 

## 2016-06-22 ENCOUNTER — Encounter (HOSPITAL_BASED_OUTPATIENT_CLINIC_OR_DEPARTMENT_OTHER): Payer: Self-pay | Admitting: *Deleted

## 2016-06-22 NOTE — H&P (Signed)
Subjective:    Patient ID: Debra Griffith is a 75 y.o. female.  HPI  Referred by Dr. Donne Hazel for consultation for breast reconstruction. Presented with palpable mass left breast. Initially felt to be associated with trauma to area. Repeat diagnotic MMG with asymmetry noted left breast UIQ 4.9 x 2.4 x 2.7 cm. [place] without adenopathy. Biopsy with IDC, ER/PR+, her 2 -. Plan lumpectomy with oncoplastic reconstruction with Dr. Donne Hazel. Lumpectomy and SLN complete at this time, final pathology with 1/2 SLN, two foci IDC 1.7 and 1.5 cm, IDC focally 0.1 cm to anterior margin, +perineural invasion. Oncotype pending. ALND not recommended.  Current 46DD and is "busting out of this." Notes chronic back pain and takes chronic narcotics and muscle relaxants for this. Denies rashes beneath breasts except in very hot weather. Has been specialty fitted for bra. Has completed PT for back pain and post joint replacements. Mobility limited by hip and knee replacement and back pain, uses walker. Reports has CNA in home for 1 hour per day for massage to legs, backs for pain and to remove edema from legs. Wt stable  Review of Systems  Constitutional: Positive for fatigue.  Respiratory: Positive for shortness of breath.   Cardiovascular: Positive for leg swelling.  Musculoskeletal: Positive for arthralgias, back pain, joint swelling and myalgias.  Neurological: Positive for weakness.  Remainder 12 point review negative     Objective:   Physical Exam  Constitutional: She is oriented to person, place, and time.  Cardiovascular: Normal rate, regular rhythm and normal heart sounds.   Pulmonary/Chest: Effort normal.  Abdominal: Soft.  Lymphadenopathy:    She has no axillary adenopathy.  Neurological: She is alert and oriented to person, place, and time.   Grade 3 ptosis bilateral, L>R volume Palpable mass left UIQ SN to nipple R 40 L 39 cm BW R25 L 25 cm Nipple to IMF R 18 L 18 cm    Assessment:    Left breast ca UIQ Macromastia Chronic back pain    Plan:     Plan oncoplastic reconstruction as staged procedure with reduction 7-10 d post lumpectomy to ensure pathologic clearance. Reviewed reduction with anchor type scars, drains, post operative visits and limitations, recovery. Diminished sensation nipple and breast skin, risk of nipple loss, wound healing problems, asymmetry. Discussedsmaller breast size may aid with adjuvant radiation. Discussed will have some contraction of breast volume and increased firmness with radiation, less ptosis with aging. Discussed changes with wt gain, loss, aging. If she pursues partial mastectomy, highly encourage her to pursue breast reduction prior to XRT as risk of complications post would be very high. Counseled I cannot assure her bra size. Discussed specifically with her SN to nipple distance, she would be considered high risk for nipple loss; she reports she has though about reduction "every day of my life" and would like to pursue "nipple or no nipple."   Irene Limbo, MD [facility] [phone], pin [phone]

## 2016-06-22 NOTE — Progress Notes (Signed)
Pt denies SOB, chest pain, and being under the care of a cardiologist. Pt denies having a cardiac cath and echo but stated that a stress test was performed in 2005. Pt made aware to stop taking vitamins, fish oil and herbal medications such as turmeric. Do not take any NSAIDs ie: Ibuprofen, Advil, Naproxen, BC and Goody Powder or any medication containing Aspirin. Pt stated that she stopped taking Aspirin prior to her last procedure on 06/13/16. Pt verbalized understanding of all pre-op instructions.

## 2016-06-24 MED ORDER — DEXTROSE 5 % IV SOLN
3.0000 g | INTRAVENOUS | Status: AC
  Administered 2016-06-25: 3 g via INTRAVENOUS
  Filled 2016-06-24: qty 3000

## 2016-06-24 MED ORDER — GABAPENTIN 300 MG PO CAPS
300.0000 mg | ORAL_CAPSULE | ORAL | Status: AC
  Administered 2016-06-25: 300 mg via ORAL
  Filled 2016-06-24: qty 1

## 2016-06-25 ENCOUNTER — Encounter (HOSPITAL_COMMUNITY): Payer: Self-pay | Admitting: *Deleted

## 2016-06-25 ENCOUNTER — Observation Stay (HOSPITAL_COMMUNITY)
Admission: RE | Admit: 2016-06-25 | Discharge: 2016-06-26 | Disposition: A | Discharge status: Home / Self Care | Payer: Medicare Other | Source: Ambulatory Visit | Attending: Plastic Surgery | Admitting: Plastic Surgery

## 2016-06-25 ENCOUNTER — Ambulatory Visit (HOSPITAL_COMMUNITY): Payer: Medicare Other | Admitting: Anesthesiology

## 2016-06-25 ENCOUNTER — Encounter (HOSPITAL_COMMUNITY)
Admission: RE | Disposition: A | Discharge status: Home / Self Care | Payer: Self-pay | Source: Ambulatory Visit | Attending: Plastic Surgery

## 2016-06-25 DIAGNOSIS — G4733 Obstructive sleep apnea (adult) (pediatric): Secondary | ICD-10-CM | POA: Insufficient documentation

## 2016-06-25 DIAGNOSIS — M549 Dorsalgia, unspecified: Secondary | ICD-10-CM | POA: Diagnosis not present

## 2016-06-25 DIAGNOSIS — N6489 Other specified disorders of breast: Secondary | ICD-10-CM | POA: Insufficient documentation

## 2016-06-25 DIAGNOSIS — N6011 Diffuse cystic mastopathy of right breast: Secondary | ICD-10-CM | POA: Insufficient documentation

## 2016-06-25 DIAGNOSIS — I10 Essential (primary) hypertension: Secondary | ICD-10-CM | POA: Insufficient documentation

## 2016-06-25 DIAGNOSIS — N651 Disproportion of reconstructed breast: Secondary | ICD-10-CM | POA: Diagnosis not present

## 2016-06-25 DIAGNOSIS — C50212 Malignant neoplasm of upper-inner quadrant of left female breast: Principal | ICD-10-CM | POA: Insufficient documentation

## 2016-06-25 DIAGNOSIS — M542 Cervicalgia: Secondary | ICD-10-CM | POA: Diagnosis not present

## 2016-06-25 DIAGNOSIS — Z79891 Long term (current) use of opiate analgesic: Secondary | ICD-10-CM | POA: Insufficient documentation

## 2016-06-25 DIAGNOSIS — F329 Major depressive disorder, single episode, unspecified: Secondary | ICD-10-CM | POA: Insufficient documentation

## 2016-06-25 DIAGNOSIS — L7601 Intraoperative hemorrhage and hematoma of skin and subcutaneous tissue complicating a dermatologic procedure: Secondary | ICD-10-CM | POA: Diagnosis not present

## 2016-06-25 DIAGNOSIS — N6012 Diffuse cystic mastopathy of left breast: Secondary | ICD-10-CM | POA: Diagnosis not present

## 2016-06-25 DIAGNOSIS — G8929 Other chronic pain: Secondary | ICD-10-CM | POA: Diagnosis not present

## 2016-06-25 DIAGNOSIS — C50912 Malignant neoplasm of unspecified site of left female breast: Secondary | ICD-10-CM | POA: Diagnosis not present

## 2016-06-25 DIAGNOSIS — K219 Gastro-esophageal reflux disease without esophagitis: Secondary | ICD-10-CM | POA: Insufficient documentation

## 2016-06-25 DIAGNOSIS — F419 Anxiety disorder, unspecified: Secondary | ICD-10-CM | POA: Insufficient documentation

## 2016-06-25 DIAGNOSIS — M1990 Unspecified osteoarthritis, unspecified site: Secondary | ICD-10-CM | POA: Insufficient documentation

## 2016-06-25 DIAGNOSIS — Z483 Aftercare following surgery for neoplasm: Secondary | ICD-10-CM | POA: Diagnosis not present

## 2016-06-25 DIAGNOSIS — Z96659 Presence of unspecified artificial knee joint: Secondary | ICD-10-CM | POA: Insufficient documentation

## 2016-06-25 DIAGNOSIS — Z7982 Long term (current) use of aspirin: Secondary | ICD-10-CM | POA: Diagnosis not present

## 2016-06-25 DIAGNOSIS — J449 Chronic obstructive pulmonary disease, unspecified: Secondary | ICD-10-CM | POA: Diagnosis not present

## 2016-06-25 DIAGNOSIS — D649 Anemia, unspecified: Secondary | ICD-10-CM | POA: Diagnosis not present

## 2016-06-25 DIAGNOSIS — Z96649 Presence of unspecified artificial hip joint: Secondary | ICD-10-CM | POA: Diagnosis not present

## 2016-06-25 DIAGNOSIS — N62 Hypertrophy of breast: Secondary | ICD-10-CM | POA: Diagnosis not present

## 2016-06-25 HISTORY — PX: AXILLARY SURGERY: SHX892

## 2016-06-25 HISTORY — PX: BREAST RECONSTRUCTION: SHX9

## 2016-06-25 HISTORY — PX: REDUCTION MAMMAPLASTY: SUR839

## 2016-06-25 HISTORY — PX: BREAST REDUCTION SURGERY: SHX8

## 2016-06-25 HISTORY — PX: BREAST RECONSTRUCTION WITH PLACEMENT OF TISSUE EXPANDER AND FLEX HD (ACELLULAR HYDRATED DERMIS): SHX6295

## 2016-06-25 LAB — BASIC METABOLIC PANEL
ANION GAP: 10 (ref 5–15)
BUN: 18 mg/dL (ref 6–20)
CALCIUM: 9.5 mg/dL (ref 8.9–10.3)
CHLORIDE: 101 mmol/L (ref 101–111)
CO2: 26 mmol/L (ref 22–32)
CREATININE: 1.12 mg/dL — AB (ref 0.44–1.00)
GFR calc non Af Amer: 47 mL/min — ABNORMAL LOW (ref >60)
GFR, EST AFRICAN AMERICAN: 55 mL/min — AB (ref >60)
Glucose, Bld: 104 mg/dL — ABNORMAL HIGH (ref 65–99)
Potassium: 3.8 mmol/L (ref 3.5–5.1)
SODIUM: 137 mmol/L (ref 135–145)

## 2016-06-25 LAB — CBC
HCT: 38.8 % (ref 36.0–46.0)
HEMOGLOBIN: 12.3 g/dL (ref 12.0–15.0)
MCH: 29.1 pg (ref 26.0–34.0)
MCHC: 31.7 g/dL (ref 30.0–36.0)
MCV: 91.7 fL (ref 78.0–100.0)
PLATELETS: 295 10*3/uL (ref 150–400)
RBC: 4.23 MIL/uL (ref 3.87–5.11)
RDW: 13.4 % (ref 11.5–15.5)
WBC: 7.3 10*3/uL (ref 4.0–10.5)

## 2016-06-25 SURGERY — BREAST RECONSTRUCTION WITH PLACEMENT OF TISSUE EXPANDER AND FLEX HD (ACELLULAR HYDRATED DERMIS)
Anesthesia: General | Site: Breast | Laterality: Bilateral

## 2016-06-25 MED ORDER — CYCLOBENZAPRINE HCL 10 MG PO TABS
20.0000 mg | ORAL_TABLET | Freq: Every day | ORAL | Status: DC
  Administered 2016-06-25: 20 mg via ORAL
  Filled 2016-06-25: qty 2

## 2016-06-25 MED ORDER — FENTANYL CITRATE (PF) 100 MCG/2ML IJ SOLN
INTRAMUSCULAR | Status: AC
  Filled 2016-06-25: qty 2

## 2016-06-25 MED ORDER — LACTATED RINGERS IV SOLN
INTRAVENOUS | Status: DC
  Administered 2016-06-25 (×3): via INTRAVENOUS

## 2016-06-25 MED ORDER — ZOLPIDEM TARTRATE 5 MG PO TABS
10.0000 mg | ORAL_TABLET | Freq: Every day | ORAL | Status: DC
Start: 2016-06-25 — End: 2016-06-26

## 2016-06-25 MED ORDER — ONDANSETRON HCL 4 MG/2ML IJ SOLN: 4.0000 mg | Freq: Four times a day (QID) | INTRAMUSCULAR | Status: DC | PRN

## 2016-06-25 MED ORDER — BUPROPION HCL ER (SR) 150 MG PO TB12
150.0000 mg | ORAL_TABLET | Freq: Every morning | ORAL | Status: DC
Start: 2016-06-26 — End: 2016-06-26
  Administered 2016-06-26: 150 mg via ORAL
  Filled 2016-06-25: qty 1

## 2016-06-25 MED ORDER — METOPROLOL TARTRATE 25 MG PO TABS
25.0000 mg | ORAL_TABLET | Freq: Two times a day (BID) | ORAL | Status: DC
  Administered 2016-06-25 – 2016-06-26 (×2): 25 mg via ORAL
  Filled 2016-06-25 (×2): qty 1

## 2016-06-25 MED ORDER — OXYCODONE HCL 5 MG PO TABS
5.0000 mg | ORAL_TABLET | ORAL | Status: DC | PRN
  Administered 2016-06-25 – 2016-06-26 (×5): 10 mg via ORAL
  Filled 2016-06-25 (×5): qty 2

## 2016-06-25 MED ORDER — CHLORHEXIDINE GLUCONATE CLOTH 2 % EX PADS: 6.0000 | MEDICATED_PAD | Freq: Once | CUTANEOUS | Status: DC

## 2016-06-25 MED ORDER — LIDOCAINE HCL (CARDIAC) 20 MG/ML IV SOLN
INTRAVENOUS | Status: DC | PRN
  Administered 2016-06-25: 100 mg via INTRAVENOUS

## 2016-06-25 MED ORDER — PROMETHAZINE HCL 25 MG/ML IJ SOLN: 6.2500 mg | INTRAMUSCULAR | Status: DC | PRN

## 2016-06-25 MED ORDER — HYDROMORPHONE HCL 1 MG/ML IJ SOLN: 0.5000 mg | INTRAMUSCULAR | Status: DC | PRN

## 2016-06-25 MED ORDER — ANASTROZOLE 1 MG PO TABS
1.0000 mg | ORAL_TABLET | Freq: Every day | ORAL | Status: DC
  Administered 2016-06-26: 1 mg via ORAL
  Filled 2016-06-25: qty 1

## 2016-06-25 MED ORDER — METOCLOPRAMIDE HCL 5 MG PO TABS: 5.0000 mg | ORAL_TABLET | Freq: Three times a day (TID) | ORAL | Status: DC | PRN

## 2016-06-25 MED ORDER — METOPROLOL TARTRATE 25 MG PO TABS
25.0000 mg | ORAL_TABLET | Freq: Once | ORAL | Status: AC
  Administered 2016-06-25: 25 mg via ORAL
  Filled 2016-06-25: qty 1

## 2016-06-25 MED ORDER — ALBUMIN HUMAN 5 % IV SOLN
INTRAVENOUS | Status: DC | PRN
  Administered 2016-06-25: 15:00:00 via INTRAVENOUS

## 2016-06-25 MED ORDER — CLONAZEPAM 1 MG PO TABS
1.0000 mg | ORAL_TABLET | Freq: Every day | ORAL | Status: DC
  Administered 2016-06-25: 1 mg via ORAL
  Filled 2016-06-25: qty 1

## 2016-06-25 MED ORDER — BUPIVACAINE LIPOSOME 1.3 % IJ SUSP
20.0000 mL | INTRAMUSCULAR | Status: AC
  Administered 2016-06-25: 20 mL
  Filled 2016-06-25: qty 20

## 2016-06-25 MED ORDER — PROPOFOL 10 MG/ML IV BOLUS
INTRAVENOUS | Status: DC | PRN
  Administered 2016-06-25: 200 mg via INTRAVENOUS

## 2016-06-25 MED ORDER — NITROGLYCERIN 2 % TD OINT
0.5000 [in_us] | TOPICAL_OINTMENT | TRANSDERMAL | Status: DC
Start: 2016-06-25 — End: 2016-06-25
  Filled 2016-06-25: qty 30

## 2016-06-25 MED ORDER — AMLODIPINE BESYLATE 10 MG PO TABS
10.0000 mg | ORAL_TABLET | Freq: Every day | ORAL | Status: DC
  Administered 2016-06-26: 10 mg via ORAL
  Filled 2016-06-25: qty 1

## 2016-06-25 MED ORDER — ROCURONIUM BROMIDE 100 MG/10ML IV SOLN
INTRAVENOUS | Status: DC | PRN
  Administered 2016-06-25: 30 mg via INTRAVENOUS
  Administered 2016-06-25 (×2): 10 mg via INTRAVENOUS

## 2016-06-25 MED ORDER — FENTANYL CITRATE (PF) 100 MCG/2ML IJ SOLN
INTRAMUSCULAR | Status: DC | PRN
  Administered 2016-06-25 (×4): 25 ug via INTRAVENOUS
  Administered 2016-06-25: 100 ug via INTRAVENOUS

## 2016-06-25 MED ORDER — ARTIFICIAL TEARS OP OINT
TOPICAL_OINTMENT | OPHTHALMIC | Status: AC
  Filled 2016-06-25: qty 3.5

## 2016-06-25 MED ORDER — ONDANSETRON HCL 4 MG/2ML IJ SOLN
INTRAMUSCULAR | Status: DC | PRN
  Administered 2016-06-25 (×2): 4 mg via INTRAVENOUS

## 2016-06-25 MED ORDER — ALBUTEROL SULFATE HFA 108 (90 BASE) MCG/ACT IN AERS: 2.0000 | INHALATION_SPRAY | RESPIRATORY_TRACT | Status: DC | PRN

## 2016-06-25 MED ORDER — 0.9 % SODIUM CHLORIDE (POUR BTL) OPTIME
TOPICAL | Status: DC | PRN
  Administered 2016-06-25: 2000 mL
  Administered 2016-06-25: 1000 mL

## 2016-06-25 MED ORDER — ACETAMINOPHEN 500 MG PO TABS
1000.0000 mg | ORAL_TABLET | ORAL | Status: AC
  Administered 2016-06-25: 1000 mg via ORAL
  Filled 2016-06-25: qty 2

## 2016-06-25 MED ORDER — HYDROMORPHONE HCL 1 MG/ML IJ SOLN
0.2500 mg | INTRAMUSCULAR | Status: DC | PRN
  Administered 2016-06-25 (×2): 0.5 mg via INTRAVENOUS

## 2016-06-25 MED ORDER — SUGAMMADEX SODIUM 200 MG/2ML IV SOLN
INTRAVENOUS | Status: AC
  Filled 2016-06-25: qty 4

## 2016-06-25 MED ORDER — PROPOFOL 10 MG/ML IV BOLUS
INTRAVENOUS | Status: AC
  Filled 2016-06-25: qty 20

## 2016-06-25 MED ORDER — HYDROMORPHONE HCL 1 MG/ML IJ SOLN
INTRAMUSCULAR | Status: AC
  Filled 2016-06-25: qty 1

## 2016-06-25 MED ORDER — DEXAMETHASONE SODIUM PHOSPHATE 10 MG/ML IJ SOLN
INTRAMUSCULAR | Status: AC
  Filled 2016-06-25: qty 1

## 2016-06-25 MED ORDER — MIDAZOLAM HCL 2 MG/2ML IJ SOLN
INTRAMUSCULAR | Status: AC
  Filled 2016-06-25: qty 2

## 2016-06-25 MED ORDER — ENOXAPARIN SODIUM 40 MG/0.4ML ~~LOC~~ SOLN
40.0000 mg | SUBCUTANEOUS | Status: DC
  Administered 2016-06-26: 40 mg via SUBCUTANEOUS
  Filled 2016-06-25: qty 0.4

## 2016-06-25 MED ORDER — ROCURONIUM BROMIDE 10 MG/ML (PF) SYRINGE
PREFILLED_SYRINGE | INTRAVENOUS | Status: AC
  Filled 2016-06-25: qty 10

## 2016-06-25 MED ORDER — PHENYLEPHRINE HCL 10 MG/ML IJ SOLN
INTRAMUSCULAR | Status: DC | PRN
  Administered 2016-06-25 (×2): 40 ug via INTRAVENOUS

## 2016-06-25 MED ORDER — EPHEDRINE SULFATE 50 MG/ML IJ SOLN
INTRAMUSCULAR | Status: DC | PRN
  Administered 2016-06-25: 5 mg via INTRAVENOUS
  Administered 2016-06-25: 15 mg via INTRAVENOUS

## 2016-06-25 MED ORDER — ACETAMINOPHEN 325 MG PO TABS: 650.0000 mg | ORAL_TABLET | Freq: Four times a day (QID) | ORAL | Status: DC | PRN

## 2016-06-25 MED ORDER — CEFAZOLIN SODIUM-DEXTROSE 2-4 GM/100ML-% IV SOLN
2.0000 g | Freq: Three times a day (TID) | INTRAVENOUS | Status: AC
  Administered 2016-06-25 – 2016-06-26 (×2): 2 g via INTRAVENOUS
  Filled 2016-06-25 (×2): qty 100

## 2016-06-25 MED ORDER — IRBESARTAN 300 MG PO TABS
300.0000 mg | ORAL_TABLET | Freq: Every day | ORAL | Status: DC
  Administered 2016-06-26: 300 mg via ORAL
  Filled 2016-06-25: qty 1

## 2016-06-25 MED ORDER — KCL IN DEXTROSE-NACL 20-5-0.45 MEQ/L-%-% IV SOLN
INTRAVENOUS | Status: DC
  Administered 2016-06-25: 20:00:00 via INTRAVENOUS
  Filled 2016-06-25: qty 1000

## 2016-06-25 MED ORDER — MEPERIDINE HCL 25 MG/ML IJ SOLN: 6.2500 mg | INTRAMUSCULAR | Status: DC | PRN

## 2016-06-25 MED ORDER — ONDANSETRON 4 MG PO TBDP: 4.0000 mg | ORAL_TABLET | Freq: Four times a day (QID) | ORAL | Status: DC | PRN

## 2016-06-25 MED ORDER — SUGAMMADEX SODIUM 200 MG/2ML IV SOLN
INTRAVENOUS | Status: DC | PRN
  Administered 2016-06-25: 270 mg via INTRAVENOUS

## 2016-06-25 MED ORDER — NITROGLYCERIN 2 % TD OINT
TOPICAL_OINTMENT | TRANSDERMAL | Status: DC | PRN
  Administered 2016-06-25: 0.5 [in_us] via TOPICAL

## 2016-06-25 MED ORDER — TRIAMTERENE-HCTZ 37.5-25 MG PO TABS
1.0000 | ORAL_TABLET | Freq: Every day | ORAL | Status: DC
  Filled 2016-06-25: qty 1

## 2016-06-25 MED ORDER — EPHEDRINE 5 MG/ML INJ
INTRAVENOUS | Status: AC
  Filled 2016-06-25: qty 10

## 2016-06-25 MED ORDER — DEXTROSE 5 % IV SOLN
INTRAVENOUS | Status: DC | PRN
  Administered 2016-06-25: 20 ug/min via INTRAVENOUS

## 2016-06-25 MED ORDER — MIDAZOLAM HCL 5 MG/5ML IJ SOLN
INTRAMUSCULAR | Status: DC | PRN
  Administered 2016-06-25 (×2): 1 mg via INTRAVENOUS

## 2016-06-25 MED ORDER — BISACODYL 10 MG RE SUPP: 10.0000 mg | Freq: Every day | RECTAL | Status: DC | PRN

## 2016-06-25 MED ORDER — SUCCINYLCHOLINE CHLORIDE 20 MG/ML IJ SOLN
INTRAMUSCULAR | Status: DC | PRN
  Administered 2016-06-25: 100 mg via INTRAVENOUS

## 2016-06-25 MED ORDER — METOPROLOL TARTRATE 12.5 MG HALF TABLET
ORAL_TABLET | ORAL | Status: AC
  Filled 2016-06-25: qty 2

## 2016-06-25 MED ORDER — HEPARIN SODIUM (PORCINE) 5000 UNIT/ML IJ SOLN
5000.0000 [IU] | Freq: Once | INTRAMUSCULAR | Status: AC
  Administered 2016-06-25: 5000 [IU] via SUBCUTANEOUS
  Filled 2016-06-25: qty 1

## 2016-06-25 MED ORDER — LIDOCAINE 2% (20 MG/ML) 5 ML SYRINGE
INTRAMUSCULAR | Status: AC
  Filled 2016-06-25: qty 5

## 2016-06-25 MED ORDER — SUCCINYLCHOLINE CHLORIDE 200 MG/10ML IV SOSY
PREFILLED_SYRINGE | INTRAVENOUS | Status: AC
  Filled 2016-06-25: qty 10

## 2016-06-25 MED ORDER — ONDANSETRON HCL 4 MG/2ML IJ SOLN
INTRAMUSCULAR | Status: AC
  Filled 2016-06-25: qty 2

## 2016-06-25 MED ORDER — DEXAMETHASONE SODIUM PHOSPHATE 10 MG/ML IJ SOLN
INTRAMUSCULAR | Status: DC | PRN
  Administered 2016-06-25: 10 mg via INTRAVENOUS

## 2016-06-25 SURGICAL SUPPLY — 92 items
APPLIER CLIP 9.375 MED OPEN (MISCELLANEOUS) ×4
BAG DECANTER FOR FLEXI CONT (MISCELLANEOUS) IMPLANT
BINDER BREAST 3XL (BIND) ×4 IMPLANT
BINDER BREAST LRG (GAUZE/BANDAGES/DRESSINGS) IMPLANT
BINDER BREAST MEDIUM (GAUZE/BANDAGES/DRESSINGS) IMPLANT
BINDER BREAST XLRG (GAUZE/BANDAGES/DRESSINGS) IMPLANT
BINDER BREAST XXLRG (GAUZE/BANDAGES/DRESSINGS) IMPLANT
BLADE 10 SAFETY STRL DISP (BLADE) IMPLANT
BLADE HEX COATED 2.75 (ELECTRODE) ×4 IMPLANT
BLADE SURG 10 STRL SS (BLADE) ×12 IMPLANT
BLADE SURG 15 STRL LF DISP TIS (BLADE) ×2 IMPLANT
BLADE SURG 15 STRL SS (BLADE) ×2
BNDG GAUZE ELAST 4 BULKY (GAUZE/BANDAGES/DRESSINGS) ×8 IMPLANT
CANISTER SUCTION 1200CC (MISCELLANEOUS) ×4 IMPLANT
CANISTER SUCTION 2500CC (MISCELLANEOUS) ×4 IMPLANT
CHLORAPREP W/TINT 26ML (MISCELLANEOUS) ×4 IMPLANT
CLIP APPLIE 9.375 MED OPEN (MISCELLANEOUS) ×2 IMPLANT
CLOSURE STERI-STRIP 1/2X4 (GAUZE/BANDAGES/DRESSINGS) ×1
CLSR STERI-STRIP ANTIMIC 1/2X4 (GAUZE/BANDAGES/DRESSINGS) ×3 IMPLANT
COVER SURGICAL LIGHT HANDLE (MISCELLANEOUS) ×4 IMPLANT
DECANTER SPIKE VIAL GLASS SM (MISCELLANEOUS) IMPLANT
DERMABOND ADVANCED (GAUZE/BANDAGES/DRESSINGS) ×2
DERMABOND ADVANCED .7 DNX12 (GAUZE/BANDAGES/DRESSINGS) ×2 IMPLANT
DRAIN CHANNEL 15F RND FF W/TCR (WOUND CARE) ×8 IMPLANT
DRAPE INCISE IOBAN 66X45 STRL (DRAPES) IMPLANT
DRAPE LAPAROSCOPIC ABDOMINAL (DRAPES) ×4 IMPLANT
DRAPE ORTHO SPLIT 77X108 STRL (DRAPES) ×4
DRAPE PROXIMA HALF (DRAPES) ×4 IMPLANT
DRAPE SURG ORHT 6 SPLT 77X108 (DRAPES) ×4 IMPLANT
DRAPE UNIVERSAL PACK (DRAPES) ×4 IMPLANT
DRAPE WARM FLUID 44X44 (DRAPE) ×4 IMPLANT
DRSG PAD ABDOMINAL 8X10 ST (GAUZE/BANDAGES/DRESSINGS) ×4 IMPLANT
DRSG TEGADERM 2-3/8X2-3/4 SM (GAUZE/BANDAGES/DRESSINGS) ×8 IMPLANT
DRSG TEGADERM 4X4.75 (GAUZE/BANDAGES/DRESSINGS) IMPLANT
ELECT BLADE 4.0 EZ CLEAN MEGAD (MISCELLANEOUS) ×4
ELECT CAUTERY BLADE 6.4 (BLADE) ×4 IMPLANT
ELECT COATED BLADE 2.86 ST (ELECTRODE) ×4 IMPLANT
ELECT REM PT RETURN 9FT ADLT (ELECTROSURGICAL) ×4
ELECTRODE BLDE 4.0 EZ CLN MEGD (MISCELLANEOUS) ×2 IMPLANT
ELECTRODE REM PT RTRN 9FT ADLT (ELECTROSURGICAL) ×2 IMPLANT
EVACUATOR SILICONE 100CC (DRAIN) ×8 IMPLANT
GAUZE SPONGE 4X4 12PLY STRL (GAUZE/BANDAGES/DRESSINGS) ×4 IMPLANT
GLOVE BIO SURGEON STRL SZ 6 (GLOVE) ×16 IMPLANT
GLOVE BIO SURGEON STRL SZ 6.5 (GLOVE) ×3 IMPLANT
GLOVE BIO SURGEONS STRL SZ 6.5 (GLOVE) ×1
GLOVE BIOGEL PI IND STRL 7.0 (GLOVE) ×2 IMPLANT
GLOVE BIOGEL PI INDICATOR 7.0 (GLOVE) ×2
GLOVE SURG SS PI 6.0 STRL IVOR (GLOVE) ×4 IMPLANT
GLOVE SURG SS PI 7.0 STRL IVOR (GLOVE) ×4 IMPLANT
GOWN STRL REUS W/ TWL LRG LVL3 (GOWN DISPOSABLE) ×4 IMPLANT
GOWN STRL REUS W/TWL LRG LVL3 (GOWN DISPOSABLE) ×4
KIT BASIN OR (CUSTOM PROCEDURE TRAY) ×4 IMPLANT
KIT ROOM TURNOVER OR (KITS) ×4 IMPLANT
LIQUID BAND (GAUZE/BANDAGES/DRESSINGS) ×16 IMPLANT
MARKER SKIN DUAL TIP RULER LAB (MISCELLANEOUS) ×4 IMPLANT
NEEDLE HYPO 25X1 1.5 SAFETY (NEEDLE) IMPLANT
NEEDLE SPNL 22GX3.5 QUINCKE BK (NEEDLE) ×4 IMPLANT
NS IRRIG 1000ML POUR BTL (IV SOLUTION) ×8 IMPLANT
PACK GENERAL/GYN (CUSTOM PROCEDURE TRAY) ×4 IMPLANT
PAD ARMBOARD 7.5X6 YLW CONV (MISCELLANEOUS) ×4 IMPLANT
PIN SAFETY STERILE (MISCELLANEOUS) ×4 IMPLANT
SET ASEPTIC TRANSFER (MISCELLANEOUS) ×4 IMPLANT
SOLUTION BETADINE 4OZ (MISCELLANEOUS) ×4 IMPLANT
SPONGE LAP 18X18 X RAY DECT (DISPOSABLE) ×20 IMPLANT
STAPLER VISISTAT 35W (STAPLE) ×8 IMPLANT
SUT ETHILON 2 0 FS 18 (SUTURE) ×8 IMPLANT
SUT MNCRL 3 0 RB1 (SUTURE) IMPLANT
SUT MNCRL AB 4-0 PS2 18 (SUTURE) ×20 IMPLANT
SUT MON AB 5-0 PS2 18 (SUTURE) IMPLANT
SUT MONOCRYL 3 0 RB1 (SUTURE)
SUT PDS AB 2-0 CT1 27 (SUTURE) ×8 IMPLANT
SUT SILK 2 0 FS (SUTURE) ×4 IMPLANT
SUT VIC AB 3-0 PS2 18 (SUTURE) ×22
SUT VIC AB 3-0 PS2 18XBRD (SUTURE) ×22 IMPLANT
SUT VIC AB 3-0 SH 27 (SUTURE)
SUT VIC AB 3-0 SH 27X BRD (SUTURE) IMPLANT
SUT VIC AB 4-0 PS2 27 (SUTURE) IMPLANT
SUT VICRYL 3 0 (SUTURE) IMPLANT
SUT VICRYL 4-0 PS2 18IN ABS (SUTURE) ×16 IMPLANT
SYR 50ML LL SCALE MARK (SYRINGE) IMPLANT
SYR BULB IRRIGATION 50ML (SYRINGE) ×4 IMPLANT
SYR CONTROL 10ML LL (SYRINGE) IMPLANT
TAPE STRIPS DRAPE STRL (GAUZE/BANDAGES/DRESSINGS) ×4 IMPLANT
TOWEL OR 17X24 6PK STRL BLUE (TOWEL DISPOSABLE) ×8 IMPLANT
TOWEL OR 17X26 10 PK STRL BLUE (TOWEL DISPOSABLE) ×4 IMPLANT
TRAY FOLEY CATH 14FRSI W/METER (CATHETERS) IMPLANT
TRAY FOLEY CATH 16FRSI W/METER (SET/KITS/TRAYS/PACK) IMPLANT
TUBE CONNECTING 12'X1/4 (SUCTIONS) ×1
TUBE CONNECTING 12X1/4 (SUCTIONS) ×3 IMPLANT
TUBE CONNECTING 20'X1/4 (TUBING) ×1
TUBE CONNECTING 20X1/4 (TUBING) ×3 IMPLANT
YANKAUER SUCT BULB TIP NO VENT (SUCTIONS) ×4 IMPLANT

## 2016-06-25 NOTE — Interval H&P Note (Signed)
History and Physical Interval Note:  06/25/2016 9:19 AM  Debra Griffith  has presented today for surgery, with the diagnosis of left breast cancer, macromastia, chronic back pain  The various methods of treatment have been discussed with the patient and family. After consideration of risks, benefits and other options for treatment, the patient has consented to  Procedure(s): BILATERAL ONCOPLASTIC BREAST RECONSTRUCTION WITH BREAST REDUCTION AND POSSIBLE MASTOPEXY (Bilateral) MAMMARY REDUCTION  (BREAST) BILATERAL (Bilateral) POSSIBLE MASTOPEXY (Bilateral) as a surgical intervention .  The patient's history has been reviewed, patient examined, no change in status, stable for surgery.  I have reviewed the patient's chart and labs.  Questions were answered to the patient's satisfaction.     Rashan Rounsaville

## 2016-06-25 NOTE — Anesthesia Procedure Notes (Signed)
Procedure Name: Intubation Date/Time: 06/25/2016 12:05 PM Performed by: Williemae Area B Pre-anesthesia Checklist: Patient identified, Emergency Drugs available, Suction available and Patient being monitored Patient Re-evaluated:Patient Re-evaluated prior to inductionOxygen Delivery Method: Circle system utilized Preoxygenation: Pre-oxygenation with 100% oxygen Intubation Type: IV induction Ventilation: Mask ventilation without difficulty Laryngoscope Size: Mac, 3 and Glidescope Grade View: Grade II Tube type: Oral Tube size: 7.5 mm Number of attempts: 2 (KBJ unable to make curve into cords which were perfectly visible; Dr. Lissa Hoard struggled a bit with same thing, then was able to pass ETT via cords) Airway Equipment and Method: Video-laryngoscopy and Rigid stylet Placement Confirmation: ETT inserted through vocal cords under direct vision,  positive ETCO2 and breath sounds checked- equal and bilateral Secured at: 22 (cm at upper teeth) cm Tube secured with: Tape Dental Injury: Teeth and Oropharynx as per pre-operative assessment

## 2016-06-25 NOTE — Transfer of Care (Signed)
Immediate Anesthesia Transfer of Care Note  Patient: Debra Griffith  Procedure(s) Performed: Procedure(s): BILATERAL ONCOPLASTIC BREAST RECONSTRUCTION WITH BREAST REDUCTION, Aspiration of left axillary seroma (Bilateral) MAMMARY REDUCTION  (BREAST) BILATERAL (Bilateral)  Patient Location: PACU  Anesthesia Type:General  Level of Consciousness: awake, alert  and patient cooperative  Airway & Oxygen Therapy: Patient Spontanous Breathing and Patient connected to nasal cannula oxygen  Post-op Assessment: Report given to RN, Post -op Vital signs reviewed and stable and Patient moving all extremities  Post vital signs: Reviewed and stable  Last Vitals:  Vitals:   06/25/16 1041 06/25/16 1657  BP: (!) 106/38 (!) 109/54  Pulse: 74 68  Resp: 20 18  Temp: 36.7 C 36.5 C    Last Pain:  Vitals:   06/25/16 1041  TempSrc: Oral  PainSc:       Patients Stated Pain Goal: 2 ([id number])  Complications: No apparent anesthesia complications

## 2016-06-25 NOTE — Progress Notes (Signed)
Admitted to room 6n10 from PACU, husband at bedside, oriented to room and surroundings, denies nausea/pain at this time

## 2016-06-25 NOTE — Anesthesia Postprocedure Evaluation (Signed)
Anesthesia Post Note  Patient: Debra Griffith  Procedure(s) Performed: Procedure(s) (LRB): BILATERAL ONCOPLASTIC BREAST RECONSTRUCTION WITH BREAST REDUCTION, Aspiration of left axillary seroma (Bilateral) MAMMARY REDUCTION  (BREAST) BILATERAL (Bilateral)  Patient location during evaluation: PACU Anesthesia Type: General Level of consciousness: awake and alert Pain management: pain level controlled Vital Signs Assessment: post-procedure vital signs reviewed and stable Respiratory status: spontaneous breathing, nonlabored ventilation and respiratory function stable Cardiovascular status: blood pressure returned to baseline and stable Postop Assessment: no signs of nausea or vomiting Anesthetic complications: no    Last Vitals:  Vitals:   06/25/16 1741 06/25/16 2015  BP: 106/64 (!) 149/78  Pulse: 73 84  Resp: 17 18  Temp: 36.1 C 36.8 C    Last Pain:  Vitals:   06/25/16 2015  TempSrc: Axillary  PainSc: 5                  Cecile Guevara A

## 2016-06-25 NOTE — Progress Notes (Signed)
Dr Lissa Hoard called and informed of heart rate and Bp. States to give Lopressor.

## 2016-06-25 NOTE — Op Note (Signed)
Operative Note   DATE OF OPERATION: 10.9.17  LOCATION: [facility]-observation  SURGICAL DIVISION: Plastic Surgery  PREOPERATIVE DIAGNOSES:  1. Macromastia 2. Left breast cancer upper inner 3. S/p lumpectomy 4. Chronic neck pain 4. Chronic back pain  POSTOPERATIVE DIAGNOSES:  Same, 5. Seroma left axilla  PROCEDURE:  1. Oncoplastic reconstruction with bilateral breast reduction 2. Aspiration left axillary seroma  SURGEON: Irene Limbo MD MBA  ASSISTANT: Weston Anna RNFA  ANESTHESIA:  General.   EBL: [id number] ml  COMPLICATIONS: None immediate.   INDICATIONS FOR PROCEDURE:  The patient, Debra Griffith, is a 75 y.o. female born on [birth year], is here for oncoplastic reconstruction following left lumpectomy and SLN.    FINDINGS: Right reduction total 1341 g , left reduction including lumpectomy margin which was sent as separate specimen, 1155 g. Total 100 ml straw colored seroma aspirated from left axilla.  DESCRIPTION OF PROCEDURE:  The patient was marked standing in the preoperative area to mark sternal notch, chest midline, anterior axillary lines, inframammary folds. The location of new nipple areolar complex was marked at level of on iframammary fold on anterior surface breast by palpation. This was marked symmetric over bilateral breasts. With aid of Wise pattern marker, location of new nipple areolar complex and vertical limbs (8 cm) were marked. The patient was taken to the operating room. SCDs were placed and IV antibiotics were given. The patient's operative site was prepped and draped in a sterile fashion. A time out was performed and all information was confirmed to be correct.  Over left breast, medial pedicle marked and nipple areolar complex marked with 50 mm diameter marker. Pedicle deepithlialized and developed 5-6 cm thickness and dissected toward chest wall until tension free rotation of pedicle achieved. Prior lumpectomy incision was made along vertical limb and areolar  portion Wise pattern markings and the incision was excised with resection. Inferior pole breast tissue resected.Medial and lateral flaps developed. Additional tissue from superior breast in area of planned NAC inset was resected. This including the lateral surface of lumpectomy cavity and this was sent as separate specimen with lumpectomy cavity marked. Clips placed to mark lumpectomy cavity margins. Breast tailor tacked closed.  I then directed attention to right breast where superomedial pedicle designed. The pedicle was deepithelialized and developed to chest wall. Inferior pole and additional superior pole breast tissue excised. Patient brought to upright sitting position and assessed for symmetry, additional tissue tailor tacked for excision. Patent returned to supine position and additional tissue resected.  Breast cavities irrigated and hemostasis obtained. 15 Fr JP placed in each breast and secured with 2-0 nylon. Closure completed with 3-0 vicryl to approximate dermis along inframammary fold and vertical limb. NAC inset with 4-0 vicryl in dermis.Skin closure completed with 4-0 monocryl subcuticular throughout.Tissue adhesive applied. Exparel infiltrated throughout bilateral incisions prior to closure.  Patient noted to have large seroma left axilla and using 22 Ga needle, seroma aspirated, total 100 ml.   Dry dressing and breast binder applied. The patient was allowed to wake from anesthesia, extubated and taken to the recovery room in satisfactory condition.  The patient was allowed to wake from anesthesia, extubated and taken to the recovery room in satisfactory condition.   SPECIMENS: right and left breast reduction, left breast reduction-mastectomy cavity  DRAINS: 15 Fr JP in right and left breast  Irene Limbo, MD [facility] Plastic & Reconstructive Surgery [phone], pin [phone]

## 2016-06-25 NOTE — Anesthesia Preprocedure Evaluation (Addendum)
Anesthesia Evaluation  Patient identified by MRN, date of birth, ID band Patient awake    Reviewed: Allergy & Precautions, NPO status , Patient's Chart, lab work & pertinent test results, reviewed documented beta blocker date and time   History of Anesthesia Complications (+) PONV and history of anesthetic complications  Airway Mallampati: II  TM Distance: >3 FB Neck ROM: Full    Dental no notable dental hx. (+) Dental Advisory Given, Caps   Pulmonary sleep apnea , COPD,  COPD inhaler,  Bronchitis about once each year; rare use of albuterol.  Never smoker.  Sleep Study early Sept mild OSA and deoxygenation.   Pulmonary exam normal breath sounds clear to auscultation       Cardiovascular hypertension, Pt. on home beta blockers and Pt. on medications Normal cardiovascular exam+ dysrhythmias  Rhythm:Regular Rate:Normal     Neuro/Psych PSYCHIATRIC DISORDERS Anxiety Depression    GI/Hepatic GERD  Medicated and Controlled,  Endo/Other    Renal/GU      Musculoskeletal  (+) Arthritis ,   Abdominal   Peds  Hematology  (+) anemia ,   Anesthesia Other Findings Extensive crowns.  Reproductive/Obstetrics                            Anesthesia Physical Anesthesia Plan  ASA: II  Anesthesia Plan: General   Post-op Pain Management:    Induction: Intravenous  Airway Management Planned: Oral ETT  Additional Equipment:   Intra-op Plan:   Post-operative Plan: Extubation in OR  Informed Consent: I have reviewed the patients History and Physical, chart, labs and discussed the procedure including the risks, benefits and alternatives for the proposed anesthesia with the patient or authorized representative who has indicated his/her understanding and acceptance.   Dental advisory given  Plan Discussed with: CRNA  Anesthesia Plan Comments:         Anesthesia Quick Evaluation

## 2016-06-26 ENCOUNTER — Encounter (HOSPITAL_COMMUNITY): Payer: Self-pay | Admitting: Plastic Surgery

## 2016-06-26 DIAGNOSIS — N62 Hypertrophy of breast: Secondary | ICD-10-CM | POA: Diagnosis not present

## 2016-06-26 DIAGNOSIS — G8929 Other chronic pain: Secondary | ICD-10-CM | POA: Diagnosis not present

## 2016-06-26 DIAGNOSIS — N6489 Other specified disorders of breast: Secondary | ICD-10-CM | POA: Diagnosis not present

## 2016-06-26 DIAGNOSIS — N6012 Diffuse cystic mastopathy of left breast: Secondary | ICD-10-CM | POA: Diagnosis not present

## 2016-06-26 DIAGNOSIS — C50212 Malignant neoplasm of upper-inner quadrant of left female breast: Secondary | ICD-10-CM | POA: Diagnosis not present

## 2016-06-26 DIAGNOSIS — M542 Cervicalgia: Secondary | ICD-10-CM | POA: Diagnosis not present

## 2016-06-26 MED ORDER — OXYCODONE HCL 5 MG PO TABS: 5.0000 mg | ORAL_TABLET | ORAL | 0 refills | Status: DC | PRN

## 2016-06-26 NOTE — Progress Notes (Signed)
Discharge home. Home discharge instruction given, no question verbalized. JP teaching given. 

## 2016-06-26 NOTE — Care Management Obs Status (Signed)
[facility] NOTIFICATION   Patient Details  Name: INIOLUWA BRUMLOW MRN: [id number] Date of Birth: 11/28/1940   Medicare Observation Status Notification Given:  Yes (Medicare procedure)    Marilu Favre, RN 06/26/2016, 11:34 AM

## 2016-06-26 NOTE — Progress Notes (Signed)
  POD #1 bilateral oncoplastic reduction  Foley out this am, has not ambulated or voided, miminal pain, feels oxycodone working better than Norco  Temp:  [97 F (36.1 C)-98.2 F (36.8 C)] 98 F (36.7 C) (10/10 0602) Pulse Rate:  [68-88] 87 (10/10 0602) Resp:  [15-20] 17 (10/10 0602) BP: (101-149)/(38-78) 137/58 (10/10 0602) SpO2:  [94 %-100 %] 97 % (10/10 0602) Weight:  [133.4 kg (294 lb)] 133.4 kg (294 lb) (10/09 1041)   PE  Breasts soft with developing ecchymoses Left NAC ecchymotic, blanches with pressure, minimal sensation Right NAC, healthy, pink Drains serosang   A/P Hopefully home this pm if able to ambulate  Irene Limbo, MD [facility] Plastic & Reconstructive Surgery [phone], pin [phone]

## 2016-06-26 NOTE — Discharge Summary (Signed)
Physician Discharge Summary  Patient ID: Debra Griffith MRN: [id number] DOB/AGE: July 31, 1941 75 y.o.  Admit date: 06/25/2016 Discharge date: 06/26/2016  Admission Diagnoses: Left breast cancer  Discharge Diagnoses:  Active Problems:   Breast cancer, left breast [facility])   Discharged Condition: stable  Hospital Course: Patient did well postoperatively with minimal pain and tolerating diet. Foley removed on POD# 1 and patient able to ambulate and void following this. Family instructed on drain care. Left nipple areola complex demonstrated some ischemic changes and will need to observe.   Treatments: surgery: bilateral oncoplastic reconstruction with breast reduction, aspiration left axilla seroma  Discharge Exam: Blood pressure (!) 137/58, pulse 87, temperature 98 F (36.7 C), temperature source Oral, resp. rate 17, height 5' (1.524 m), weight 133.4 kg (294 lb), SpO2 97 %. Incision/Wound: breasts soft, drains serosanguinous, incisions dry.   Disposition: 01-Home or Self Care  Discharge Instructions    Call MD for:  redness, tenderness, or signs of infection (pain, swelling, bleeding, redness, odor or green/yellow discharge around incision site)    Complete by:  As directed    Call MD for:  temperature >100.5    Complete by:  As directed    Discharge instructions    Complete by:  As directed    Ok to remove dressings and shower am 10.11.17. Soap and water ok, pat incisions dry. No creams or ointments over incisions. Do not let drains dangle in shower, attach to lanyard or similar.Strip and record drains twice daily and bring log to clinic visit.  Breast binder or soft compression bra all other times.  Ok to raise arms above shoulders for bathing and dressing.  No house yard work or exercise until cleared by MD.   Driving Restrictions    Complete by:  As directed    No driving if taking narcotics   Lifting restrictions    Complete by:  As directed    No lifting greater than 5  lbs. Ok to use arms to help pull yourself out of bed   Resume previous diet    Complete by:  As directed        Medication List    STOP taking these medications   HYDROcodone-acetaminophen 10-325 MG tablet Commonly known as:  NORCO     TAKE these medications   acetaminophen 325 MG tablet Commonly known as:  TYLENOL Take 2 tablets (650 mg total) by mouth every 6 (six) hours as needed for mild pain (or Fever >/= 101).   albuterol 108 (90 Base) MCG/ACT inhaler Commonly known as:  PROVENTIL HFA;VENTOLIN HFA Inhale 2 puffs into the lungs every 6 (six) hours as needed. Wheezing and shortness of breath   amLODipine 10 MG tablet Commonly known as:  NORVASC Take 10 mg by mouth daily.   anastrozole 1 MG tablet Commonly known as:  ARIMIDEX Take 1 tablet (1 mg total) by mouth daily.   aspirin EC 81 MG tablet Take 81 mg by mouth daily. Will stop prior to surgery   B-12 500 MCG Tabs Take 500 mcg by mouth daily.   bisacodyl 10 MG suppository Commonly known as:  DULCOLAX Place 1 suppository (10 mg total) rectally daily as needed for moderate constipation.   buPROPion 150 MG 12 hr tablet Commonly known as:  WELLBUTRIN SR Take 150 mg by mouth every morning.   cholecalciferol 1000 units tablet Commonly known as:  VITAMIN D Take 1,000 Units by mouth daily.   clonazePAM 1 MG tablet Commonly known as:  Bobbye [place]  Take 1 mg by mouth at bedtime.   cyclobenzaprine 10 MG tablet Commonly known as:  FLEXERIL Take 20 mg by mouth at bedtime.   Fish Oil 1000 MG Caps Take 1,000 mg by mouth daily.   hydroxychloroquine 200 MG tablet Commonly known as:  PLAQUENIL Take 200 mg by mouth daily.   MAGNESIUM PO Take 2 tablets by mouth daily.   methocarbamol 500 MG tablet Commonly known as:  ROBAXIN Take 1 tablet (500 mg total) by mouth every 6 (six) hours as needed for muscle spasms.   metoCLOPramide 5 MG tablet Commonly known as:  REGLAN Take 1-2 tablets (5-10 mg total) by mouth every 8  (eight) hours as needed for nausea (if ondansetron (ZOFRAN) ineffective.).   metoprolol tartrate 25 MG tablet Commonly known as:  LOPRESSOR Take 25 mg by mouth 2 (two) times daily.   oxyCODONE 5 MG immediate release tablet Commonly known as:  Oxy IR/ROXICODONE Take 1-2 tablets (5-10 mg total) by mouth every 4 (four) hours as needed for moderate pain.   triamterene-hydrochlorothiazide 37.5-25 MG tablet Commonly known as:  MAXZIDE-25 Take 1 tablet by mouth daily.   Turmeric 500 MG Caps Take 500 mg by mouth 2 (two) times daily.   valsartan 320 MG tablet Commonly known as:  DIOVAN Take 320 mg by mouth daily.   vitamin C 500 MG tablet Commonly known as:  ASCORBIC ACID Take 1,000 mg by mouth daily.   zolpidem 10 MG tablet Commonly known as:  AMBIEN Take 10 mg by mouth at bedtime.      Follow-up Information    Racer Quam, MD Follow up in 1 week(s).   Specialty:  Plastic Surgery Why:  as scheduled Contact information: [address] [place] [address] [phone]           Signed: Irene Limbo 06/26/2016, 1:03 PM

## 2016-07-02 ENCOUNTER — Telehealth: Payer: Self-pay | Admitting: *Deleted

## 2016-07-02 NOTE — Telephone Encounter (Signed)
Received notification from [facility] that they were cancelling Oncotype Testing d/t Medicare regulation requiring a 14 day delay in ordering oncotypes. New order placed. [facility] notified.  F/u appts r/s d/t results will not be back in time.

## 2016-07-04 ENCOUNTER — Telehealth: Payer: Self-pay | Admitting: *Deleted

## 2016-07-04 NOTE — Telephone Encounter (Signed)
Pt return call. Discussed changes in schedule. Confirm appt with Dr. Jana Hakim on 11/3 at 1100

## 2016-07-04 NOTE — Telephone Encounter (Signed)
Left vm to notify pt of changes in schedule d/t oncotype testing. Contact information provided.

## 2016-07-05 ENCOUNTER — Ambulatory Visit: Payer: Medicare Other | Admitting: Oncology

## 2016-07-06 ENCOUNTER — Ambulatory Visit: Payer: Medicare Other | Admitting: Oncology

## 2016-07-09 DIAGNOSIS — C50212 Malignant neoplasm of upper-inner quadrant of left female breast: Secondary | ICD-10-CM | POA: Diagnosis not present

## 2016-07-10 ENCOUNTER — Encounter (HOSPITAL_COMMUNITY): Payer: Self-pay

## 2016-07-10 DIAGNOSIS — B029 Zoster without complications: Secondary | ICD-10-CM | POA: Diagnosis not present

## 2016-07-11 ENCOUNTER — Encounter (HOSPITAL_COMMUNITY): Payer: Self-pay | Admitting: Certified Registered"

## 2016-07-11 ENCOUNTER — Ambulatory Visit (HOSPITAL_COMMUNITY): Payer: Medicare Other | Admitting: Anesthesiology

## 2016-07-11 ENCOUNTER — Observation Stay (HOSPITAL_COMMUNITY)
Admission: RE | Admit: 2016-07-11 | Discharge: 2016-07-12 | Disposition: A | Discharge status: Home / Self Care | Payer: Medicare Other | Source: Ambulatory Visit | Attending: Plastic Surgery | Admitting: Plastic Surgery

## 2016-07-11 ENCOUNTER — Encounter (HOSPITAL_COMMUNITY)
Admission: RE | Disposition: A | Discharge status: Home / Self Care | Payer: Self-pay | Source: Ambulatory Visit | Attending: Plastic Surgery

## 2016-07-11 ENCOUNTER — Telehealth: Payer: Self-pay | Admitting: *Deleted

## 2016-07-11 ENCOUNTER — Ambulatory Visit: Payer: Medicare Other | Admitting: Radiation Oncology

## 2016-07-11 DIAGNOSIS — I10 Essential (primary) hypertension: Secondary | ICD-10-CM | POA: Insufficient documentation

## 2016-07-11 DIAGNOSIS — C50912 Malignant neoplasm of unspecified site of left female breast: Secondary | ICD-10-CM | POA: Diagnosis present

## 2016-07-11 DIAGNOSIS — N641 Fat necrosis of breast: Secondary | ICD-10-CM | POA: Diagnosis not present

## 2016-07-11 DIAGNOSIS — Z7982 Long term (current) use of aspirin: Secondary | ICD-10-CM | POA: Diagnosis not present

## 2016-07-11 DIAGNOSIS — N61 Mastitis without abscess: Secondary | ICD-10-CM | POA: Diagnosis not present

## 2016-07-11 DIAGNOSIS — G4733 Obstructive sleep apnea (adult) (pediatric): Secondary | ICD-10-CM | POA: Diagnosis not present

## 2016-07-11 DIAGNOSIS — Z853 Personal history of malignant neoplasm of breast: Secondary | ICD-10-CM | POA: Diagnosis not present

## 2016-07-11 DIAGNOSIS — C50212 Malignant neoplasm of upper-inner quadrant of left female breast: Secondary | ICD-10-CM | POA: Diagnosis not present

## 2016-07-11 DIAGNOSIS — Z170 Estrogen receptor positive status [ER+]: Secondary | ICD-10-CM | POA: Diagnosis not present

## 2016-07-11 DIAGNOSIS — J449 Chronic obstructive pulmonary disease, unspecified: Secondary | ICD-10-CM | POA: Insufficient documentation

## 2016-07-11 DIAGNOSIS — N6489 Other specified disorders of breast: Secondary | ICD-10-CM | POA: Diagnosis not present

## 2016-07-11 DIAGNOSIS — K219 Gastro-esophageal reflux disease without esophagitis: Secondary | ICD-10-CM | POA: Insufficient documentation

## 2016-07-11 DIAGNOSIS — M1990 Unspecified osteoarthritis, unspecified site: Secondary | ICD-10-CM | POA: Diagnosis not present

## 2016-07-11 DIAGNOSIS — I96 Gangrene, not elsewhere classified: Secondary | ICD-10-CM | POA: Diagnosis not present

## 2016-07-11 HISTORY — PX: DEBRIDEMENT AND CLOSURE WOUND: SHX5614

## 2016-07-11 LAB — BASIC METABOLIC PANEL
ANION GAP: 12 (ref 5–15)
BUN: 17 mg/dL (ref 6–20)
CALCIUM: 9.3 mg/dL (ref 8.9–10.3)
CO2: 24 mmol/L (ref 22–32)
CREATININE: 1.38 mg/dL — AB (ref 0.44–1.00)
Chloride: 98 mmol/L — ABNORMAL LOW (ref 101–111)
GFR, EST AFRICAN AMERICAN: 42 mL/min — AB (ref >60)
GFR, EST NON AFRICAN AMERICAN: 37 mL/min — AB (ref >60)
GLUCOSE: 124 mg/dL — AB (ref 65–99)
Potassium: 3.9 mmol/L (ref 3.5–5.1)
Sodium: 134 mmol/L — ABNORMAL LOW (ref 135–145)

## 2016-07-11 LAB — CBC
HEMATOCRIT: 32.7 % — AB (ref 36.0–46.0)
Hemoglobin: 10.5 g/dL — ABNORMAL LOW (ref 12.0–15.0)
MCH: 28.2 pg (ref 26.0–34.0)
MCHC: 32.1 g/dL (ref 30.0–36.0)
MCV: 87.9 fL (ref 78.0–100.0)
PLATELETS: 345 10*3/uL (ref 150–400)
RBC: 3.72 MIL/uL — ABNORMAL LOW (ref 3.87–5.11)
RDW: 13.2 % (ref 11.5–15.5)
WBC: 7.7 10*3/uL (ref 4.0–10.5)

## 2016-07-11 SURGERY — DEBRIDEMENT, WOUND, WITH CLOSURE
Anesthesia: General | Laterality: Right

## 2016-07-11 MED ORDER — CEFAZOLIN SODIUM-DEXTROSE 2-4 GM/100ML-% IV SOLN
INTRAVENOUS | Status: AC
  Filled 2016-07-11: qty 100

## 2016-07-11 MED ORDER — VALACYCLOVIR HCL 500 MG PO TABS
1000.0000 mg | ORAL_TABLET | Freq: Every day | ORAL | Status: DC
  Administered 2016-07-12: 1000 mg via ORAL
  Filled 2016-07-11: qty 2

## 2016-07-11 MED ORDER — CEFAZOLIN SODIUM-DEXTROSE 2-4 GM/100ML-% IV SOLN
2.0000 g | INTRAVENOUS | Status: AC
  Administered 2016-07-11: 2 g via INTRAVENOUS

## 2016-07-11 MED ORDER — SUCCINYLCHOLINE CHLORIDE 20 MG/ML IJ SOLN
INTRAMUSCULAR | Status: DC | PRN
  Administered 2016-07-11: 140 mg via INTRAVENOUS

## 2016-07-11 MED ORDER — PROPOFOL 10 MG/ML IV BOLUS
INTRAVENOUS | Status: AC
  Filled 2016-07-11: qty 20

## 2016-07-11 MED ORDER — 0.9 % SODIUM CHLORIDE (POUR BTL) OPTIME
TOPICAL | Status: DC | PRN
  Administered 2016-07-11: 1000 mL

## 2016-07-11 MED ORDER — AMLODIPINE BESYLATE 10 MG PO TABS
10.0000 mg | ORAL_TABLET | Freq: Every day | ORAL | Status: DC
  Administered 2016-07-12: 10 mg via ORAL
  Filled 2016-07-11: qty 1

## 2016-07-11 MED ORDER — LACTATED RINGERS IV SOLN
INTRAVENOUS | Status: DC | PRN
  Administered 2016-07-11 (×2): via INTRAVENOUS

## 2016-07-11 MED ORDER — CLONAZEPAM 1 MG PO TABS
1.0000 mg | ORAL_TABLET | Freq: Every day | ORAL | Status: DC
  Administered 2016-07-11: 1 mg via ORAL
  Filled 2016-07-11: qty 1

## 2016-07-11 MED ORDER — FENTANYL CITRATE (PF) 100 MCG/2ML IJ SOLN
INTRAMUSCULAR | Status: AC
  Filled 2016-07-11: qty 2

## 2016-07-11 MED ORDER — HYDROMORPHONE HCL 2 MG/ML IJ SOLN: 0.5000 mg | INTRAMUSCULAR | Status: DC | PRN

## 2016-07-11 MED ORDER — ANASTROZOLE 1 MG PO TABS
1.0000 mg | ORAL_TABLET | Freq: Every day | ORAL | Status: DC
  Administered 2016-07-12: 1 mg via ORAL
  Filled 2016-07-11: qty 1

## 2016-07-11 MED ORDER — BUPROPION HCL ER (SR) 150 MG PO TB12
150.0000 mg | ORAL_TABLET | Freq: Every morning | ORAL | Status: DC
  Administered 2016-07-12: 150 mg via ORAL
  Filled 2016-07-11: qty 1

## 2016-07-11 MED ORDER — CHLORHEXIDINE GLUCONATE CLOTH 2 % EX PADS: 6.0000 | MEDICATED_PAD | Freq: Once | CUTANEOUS | Status: DC

## 2016-07-11 MED ORDER — ZOLPIDEM TARTRATE 5 MG PO TABS: 5.0000 mg | ORAL_TABLET | Freq: Every evening | ORAL | Status: DC | PRN

## 2016-07-11 MED ORDER — MIDAZOLAM HCL 2 MG/2ML IJ SOLN
INTRAMUSCULAR | Status: AC
  Filled 2016-07-11: qty 2

## 2016-07-11 MED ORDER — OXYCODONE HCL 5 MG PO TABS
5.0000 mg | ORAL_TABLET | ORAL | Status: DC | PRN
  Administered 2016-07-11 – 2016-07-12 (×3): 10 mg via ORAL
  Filled 2016-07-11 (×3): qty 2

## 2016-07-11 MED ORDER — FENTANYL CITRATE (PF) 100 MCG/2ML IJ SOLN
INTRAMUSCULAR | Status: DC | PRN
  Administered 2016-07-11 (×2): 50 ug via INTRAVENOUS

## 2016-07-11 MED ORDER — ONDANSETRON HCL 4 MG/2ML IJ SOLN
INTRAMUSCULAR | Status: DC | PRN
  Administered 2016-07-11: 4 mg via INTRAVENOUS

## 2016-07-11 MED ORDER — ALBUTEROL SULFATE (2.5 MG/3ML) 0.083% IN NEBU: 3.0000 mL | INHALATION_SOLUTION | RESPIRATORY_TRACT | Status: DC | PRN

## 2016-07-11 MED ORDER — HEPARIN SODIUM (PORCINE) 5000 UNIT/ML IJ SOLN
5000.0000 [IU] | Freq: Three times a day (TID) | INTRAMUSCULAR | Status: DC
  Administered 2016-07-11 – 2016-07-12 (×2): 5000 [IU] via SUBCUTANEOUS
  Filled 2016-07-11 (×2): qty 1

## 2016-07-11 MED ORDER — METOPROLOL TARTRATE 12.5 MG HALF TABLET
ORAL_TABLET | ORAL | Status: AC
  Administered 2016-07-11: 25 mg via ORAL
  Filled 2016-07-11: qty 2

## 2016-07-11 MED ORDER — LIDOCAINE HCL (CARDIAC) 20 MG/ML IV SOLN
INTRAVENOUS | Status: DC | PRN
Start: 2016-07-11 — End: 2016-07-11
  Administered 2016-07-11: 100 mg via INTRAVENOUS

## 2016-07-11 MED ORDER — METOPROLOL TARTRATE 25 MG PO TABS
25.0000 mg | ORAL_TABLET | Freq: Once | ORAL | Status: AC
  Administered 2016-07-11: 25 mg via ORAL
  Filled 2016-07-11: qty 1

## 2016-07-11 MED ORDER — ONDANSETRON HCL 4 MG/2ML IJ SOLN: 4.0000 mg | Freq: Four times a day (QID) | INTRAMUSCULAR | Status: DC | PRN

## 2016-07-11 MED ORDER — KCL IN DEXTROSE-NACL 20-5-0.45 MEQ/L-%-% IV SOLN
INTRAVENOUS | Status: DC
  Administered 2016-07-11: 23:00:00 via INTRAVENOUS
  Filled 2016-07-11: qty 1000

## 2016-07-11 MED ORDER — PROPOFOL 10 MG/ML IV BOLUS
INTRAVENOUS | Status: DC | PRN
  Administered 2016-07-11: 200 mg via INTRAVENOUS
  Administered 2016-07-11: 50 mg via INTRAVENOUS

## 2016-07-11 MED ORDER — METOPROLOL TARTRATE 25 MG PO TABS
25.0000 mg | ORAL_TABLET | Freq: Two times a day (BID) | ORAL | Status: DC
  Administered 2016-07-12: 25 mg via ORAL
  Filled 2016-07-11: qty 1

## 2016-07-11 MED ORDER — IRBESARTAN 300 MG PO TABS
300.0000 mg | ORAL_TABLET | Freq: Every day | ORAL | Status: DC
  Administered 2016-07-12: 300 mg via ORAL
  Filled 2016-07-11: qty 1

## 2016-07-11 MED ORDER — LACTATED RINGERS IV SOLN
Freq: Once | INTRAVENOUS | Status: AC
  Administered 2016-07-11: 16:00:00 via INTRAVENOUS

## 2016-07-11 MED ORDER — TRIAMTERENE-HCTZ 37.5-25 MG PO TABS
1.0000 | ORAL_TABLET | Freq: Every day | ORAL | Status: DC
  Administered 2016-07-12: 1 via ORAL
  Filled 2016-07-11: qty 1

## 2016-07-11 MED ORDER — ONDANSETRON 4 MG PO TBDP: 4.0000 mg | ORAL_TABLET | Freq: Four times a day (QID) | ORAL | Status: DC | PRN

## 2016-07-11 MED ORDER — CYCLOBENZAPRINE HCL 10 MG PO TABS
20.0000 mg | ORAL_TABLET | Freq: Every day | ORAL | Status: DC
  Administered 2016-07-11: 20 mg via ORAL
  Filled 2016-07-11: qty 2

## 2016-07-11 MED ORDER — CEFAZOLIN SODIUM-DEXTROSE 2-4 GM/100ML-% IV SOLN
2.0000 g | Freq: Three times a day (TID) | INTRAVENOUS | Status: DC
  Administered 2016-07-12: 2 g via INTRAVENOUS
  Filled 2016-07-11 (×2): qty 100

## 2016-07-11 SURGICAL SUPPLY — 50 items
BAG DECANTER FOR FLEXI CONT (MISCELLANEOUS) IMPLANT
BLADE 10 SAFETY STRL DISP (BLADE) IMPLANT
BLADE SURG ROTATE 9660 (MISCELLANEOUS) IMPLANT
BNDG GAUZE ELAST 4 BULKY (GAUZE/BANDAGES/DRESSINGS) IMPLANT
CANISTER SUCTION 2500CC (MISCELLANEOUS) ×3 IMPLANT
CHLORAPREP W/TINT 26ML (MISCELLANEOUS) IMPLANT
CONT SPEC 4OZ CLIKSEAL STRL BL (MISCELLANEOUS) ×3 IMPLANT
CONT SPEC STER OR (MISCELLANEOUS) IMPLANT
COVER SURGICAL LIGHT HANDLE (MISCELLANEOUS) ×3 IMPLANT
DRAPE IMP U-DRAPE 54X76 (DRAPES) IMPLANT
DRAPE INCISE IOBAN 66X45 STRL (DRAPES) IMPLANT
DRAPE LAPAROSCOPIC ABDOMINAL (DRAPES) ×3 IMPLANT
DRAPE PED LAPAROTOMY (DRAPES) IMPLANT
DRAPE PROXIMA HALF (DRAPES) ×3 IMPLANT
DRSG ADAPTIC 3X8 NADH LF (GAUZE/BANDAGES/DRESSINGS) IMPLANT
DRSG PAD ABDOMINAL 8X10 ST (GAUZE/BANDAGES/DRESSINGS) ×6 IMPLANT
DRSG TELFA 3X8 NADH (GAUZE/BANDAGES/DRESSINGS) ×3 IMPLANT
DRSG VAC ATS LRG SENSATRAC (GAUZE/BANDAGES/DRESSINGS) IMPLANT
DRSG VAC ATS MED SENSATRAC (GAUZE/BANDAGES/DRESSINGS) IMPLANT
DRSG VAC ATS SM SENSATRAC (GAUZE/BANDAGES/DRESSINGS) IMPLANT
ELECT CAUTERY BLADE 6.4 (BLADE) IMPLANT
ELECT COATED BLADE 2.86 ST (ELECTRODE) ×3 IMPLANT
ELECT REM PT RETURN 9FT ADLT (ELECTROSURGICAL) ×3
ELECTRODE REM PT RTRN 9FT ADLT (ELECTROSURGICAL) ×1 IMPLANT
GAUZE SPONGE 4X4 12PLY STRL (GAUZE/BANDAGES/DRESSINGS) IMPLANT
GLOVE BIO SURGEON STRL SZ 6 (GLOVE) ×6 IMPLANT
GLOVE SURG SS PI 6.0 STRL IVOR (GLOVE) IMPLANT
GLOVE SURG SS PI 6.5 STRL IVOR (GLOVE) ×3 IMPLANT
GOWN STRL REUS W/ TWL LRG LVL3 (GOWN DISPOSABLE) ×2 IMPLANT
GOWN STRL REUS W/TWL LRG LVL3 (GOWN DISPOSABLE) ×4
HANDPIECE INTERPULSE COAX TIP (DISPOSABLE)
KIT BASIN OR (CUSTOM PROCEDURE TRAY) ×3 IMPLANT
KIT ROOM TURNOVER OR (KITS) ×3 IMPLANT
NS IRRIG 1000ML POUR BTL (IV SOLUTION) ×3 IMPLANT
PACK GENERAL/GYN (CUSTOM PROCEDURE TRAY) ×3 IMPLANT
PACK UNIVERSAL I (CUSTOM PROCEDURE TRAY) IMPLANT
PAD ARMBOARD 7.5X6 YLW CONV (MISCELLANEOUS) ×6 IMPLANT
SET HNDPC FAN SPRY TIP SCT (DISPOSABLE) IMPLANT
STAPLER VISISTAT 35W (STAPLE) ×3 IMPLANT
SURGILUBE 2OZ TUBE FLIPTOP (MISCELLANEOUS) IMPLANT
SUT ETHILON 4 0 PS 2 18 (SUTURE) ×6 IMPLANT
SUT MNCRL AB 3-0 PS2 18 (SUTURE) ×6 IMPLANT
SUT PDS AB 2-0 CT1 27 (SUTURE) ×3 IMPLANT
SUT VIC AB 5-0 PS2 18 (SUTURE) IMPLANT
SWAB COLLECTION DEVICE MRSA (MISCELLANEOUS) IMPLANT
TAPE CLOTH SURG 4X10 WHT LF (GAUZE/BANDAGES/DRESSINGS) ×3 IMPLANT
TOWEL OR 17X24 6PK STRL BLUE (TOWEL DISPOSABLE) ×3 IMPLANT
TOWEL OR 17X26 10 PK STRL BLUE (TOWEL DISPOSABLE) ×3 IMPLANT
TUBE ANAEROBIC SPECIMEN COL (MISCELLANEOUS) IMPLANT
UNDERPAD 30X30 (UNDERPADS AND DIAPERS) IMPLANT

## 2016-07-11 NOTE — Anesthesia Preprocedure Evaluation (Signed)
Anesthesia Evaluation  Patient identified by MRN, date of birth, ID band Patient awake    Reviewed: Allergy & Precautions, NPO status , Patient's Chart, lab work & pertinent test results, reviewed documented beta blocker date and time   History of Anesthesia Complications (+) PONV and history of anesthetic complications  Airway Mallampati: II  TM Distance: >3 FB Neck ROM: Full    Dental no notable dental hx. (+) Dental Advisory Given, Caps   Pulmonary sleep apnea , COPD,  COPD inhaler,  Bronchitis about once each year; rare use of albuterol.  Never smoker.  Sleep Study early Sept mild OSA and deoxygenation.   Pulmonary exam normal breath sounds clear to auscultation       Cardiovascular hypertension, Pt. on home beta blockers and Pt. on medications Normal cardiovascular exam+ dysrhythmias  Rhythm:Regular Rate:Normal     Neuro/Psych PSYCHIATRIC DISORDERS Anxiety Depression    GI/Hepatic GERD  Medicated and Controlled,  Endo/Other    Renal/GU      Musculoskeletal  (+) Arthritis ,   Abdominal   Peds  Hematology  (+) anemia ,   Anesthesia Other Findings Extensive crowns.  Reproductive/Obstetrics                             Anesthesia Physical  Anesthesia Plan  ASA: III  Anesthesia Plan: General   Post-op Pain Management:    Induction: Intravenous  Airway Management Planned: Oral ETT and Video Laryngoscope Planned  Additional Equipment:   Intra-op Plan:   Post-operative Plan: Extubation in OR  Informed Consent: I have reviewed the patients History and Physical, chart, labs and discussed the procedure including the risks, benefits and alternatives for the proposed anesthesia with the patient or authorized representative who has indicated his/her understanding and acceptance.   Dental advisory given  Plan Discussed with: CRNA  Anesthesia Plan Comments:         Anesthesia  Quick Evaluation

## 2016-07-11 NOTE — Op Note (Signed)
Operative Note   DATE OF OPERATION: 10.25.17  LOCATION: [place] [facility]-observation  SURGICAL DIVISION: Plastic Surgery  PREOPERATIVE DIAGNOSES:  1. Left breast cancer 2. S/p reduction mammaplasty 3. Necrosis left nipple areola  POSTOPERATIVE DIAGNOSES:  same  PROCEDURE:  1. Excisional debridement skin, subcutaneous tissue, breast tissue 25 cm2. 2. Layered closure left breast 9 cm  SURGEON: Irene Limbo MD MBA  ASSISTANT: none  ANESTHESIA:  General.   EBL: 50 ml  COMPLICATIONS: None immediate.   INDICATIONS FOR PROCEDURE:  The patient, Debra Griffith, is a 75 y.o. female born on 1941/07/04, is here for debridement necrotic nipple areolar complex following oncoplastic breast reduction, left lumpectomy for left breast upper inner quadrant cancer.   FINDINGS: Full thickness necrosis left NAC including superior most extent pedicle.   DESCRIPTION OF PROCEDURE:  The patient's operative site was marked with the patient in the preoperative area. The patient was taken to the operating room. SCDs were placed and IV antibiotics were given. The patient's operative site was prepped and draped in a sterile fashion. A time out was performed and all information was confirmed to be correct. Sharp excision with knife and scissors used to remove necrotic NAC and superior extent pedicle. Debridement completed until bleeding tissue noted. Wound irrigated. Interrupted 2-0 PDS used to close dead space within breast. Skin margins excised to fresh tissue and layered closure completed with 3-0 monocryl in dermis followed by short running 4-0 nylon, length 9 cm. Areolar incision converted to vertical scar continuous with vertical limb reduction pattern. Dry dressing applied.   The patient was allowed to wake from anesthesia, extubated and taken to the recovery room in satisfactory condition.   SPECIMENS: left breast nipple areolar complex  DRAINS: none  Irene Limbo, MD [facility] Plastic & Reconstructive  Surgery [phone], pin [phone]

## 2016-07-11 NOTE — Anesthesia Procedure Notes (Signed)
Procedure Name: Intubation Date/Time: 07/11/2016 6:04 PM Performed by: Manuela Schwartz B Pre-anesthesia Checklist: Patient identified, Emergency Drugs available, Suction available, Patient being monitored and Timeout performed Patient Re-evaluated:Patient Re-evaluated prior to inductionOxygen Delivery Method: Circle system utilized Preoxygenation: Pre-oxygenation with 100% oxygen Intubation Type: IV induction and Rapid sequence Laryngoscope Size: Glidescope Tube type: Oral Tube size: 7.5 mm Number of attempts: 1 Airway Equipment and Method: Stylet Placement Confirmation: ETT inserted through vocal cords under direct vision,  positive ETCO2 and breath sounds checked- equal and bilateral Secured at: 21 cm Tube secured with: Tape Dental Injury: Teeth and Oropharynx as per pre-operative assessment

## 2016-07-11 NOTE — H&P (Signed)
Subjective:     Patient ID: Debra Griffith is a 74 y.o. female.  HPI  2 weeks post op. Notes odor and increased drainage left breast. Also diagnosed with shingles since last visit and on antivirals   Presented with palpable mass left breast. Initially felt to be associated with trauma to area. Repeat diagnotic MMG with asymmetry noted left breast UIQ 4.9 x 2.4 x 2.7 cm. [place] without adenopathy. Biopsy with IDC, ER/PR+, her 2 -. Final pathology with 1/2 SLN, two foci IDC 1.7 and 1.5 cm, IDC focally 0.1 cm to anterior margin, +perineural invasion. Oncotype pending. ALND not recommended. Pathology from reduction benign.  Prior 46DD. Left reduction 1094 g right 1361 g  Review of Systems     Objective:   Physical Exam  Cardiovascular: Normal rate, regular rhythm and normal heart sounds.   Pulmonary/Chest: Effort normal and breath sounds normal.  HEENT: shingles left chest Chest: NAC left necrotic, medial left breast with erythema    Assessment:     Left breast ca UIQ S/p left lumpectomy, SLN S/p oncoplastic reduction    Plan:      Necrosis left NAC . Plan operative debridement today  Irene Limbo, MD [facility] Plastic & Reconstructive Surgery [phone], pin [phone]

## 2016-07-11 NOTE — Telephone Encounter (Signed)
Received Oncotype Score of 18/11% Left vm for pt to return call to discuss results and next steps for appt.

## 2016-07-11 NOTE — Transfer of Care (Signed)
Immediate Anesthesia Transfer of Care Note  Patient: Debra Griffith  Procedure(s) Performed: Procedure(s): DEBRIDEMENT LEFT BREAST (Right)  Patient Location: PACU  Anesthesia Type:General  Level of Consciousness: awake, alert  and oriented  Airway & Oxygen Therapy: Patient Spontanous Breathing and Patient connected to nasal cannula oxygen  Post-op Assessment: Report given to RN and Post -op Vital signs reviewed and stable  Post vital signs: Reviewed and stable  Last Vitals:  Vitals:   07/11/16 1537  BP: (!) 124/57  Pulse: 88  Resp: 18  Temp: 36.8 C    Last Pain:  Vitals:   07/11/16 1537  TempSrc: Oral  PainSc: 0-No pain         Complications: No apparent anesthesia complications

## 2016-07-11 NOTE — Progress Notes (Signed)
Called and updated volunteer so she could update family.

## 2016-07-12 ENCOUNTER — Encounter (HOSPITAL_COMMUNITY): Payer: Self-pay | Admitting: Plastic Surgery

## 2016-07-12 DIAGNOSIS — J449 Chronic obstructive pulmonary disease, unspecified: Secondary | ICD-10-CM | POA: Diagnosis not present

## 2016-07-12 DIAGNOSIS — I10 Essential (primary) hypertension: Secondary | ICD-10-CM | POA: Diagnosis not present

## 2016-07-12 DIAGNOSIS — K219 Gastro-esophageal reflux disease without esophagitis: Secondary | ICD-10-CM | POA: Diagnosis not present

## 2016-07-12 DIAGNOSIS — N6489 Other specified disorders of breast: Secondary | ICD-10-CM | POA: Diagnosis not present

## 2016-07-12 DIAGNOSIS — N61 Mastitis without abscess: Secondary | ICD-10-CM | POA: Diagnosis not present

## 2016-07-12 DIAGNOSIS — N641 Fat necrosis of breast: Secondary | ICD-10-CM | POA: Diagnosis not present

## 2016-07-12 MED ORDER — DOXYCYCLINE HYCLATE 50 MG PO CAPS: 50.0000 mg | ORAL_CAPSULE | Freq: Two times a day (BID) | ORAL | 0 refills | Status: DC

## 2016-07-12 MED ORDER — SODIUM CHLORIDE 0.9% FLUSH: 3.0000 mL | Freq: Two times a day (BID) | INTRAVENOUS | Status: DC

## 2016-07-12 MED ORDER — SODIUM CHLORIDE 0.9 % IV SOLN: 250.0000 mL | INTRAVENOUS | Status: DC | PRN

## 2016-07-12 MED ORDER — OXYCODONE HCL 5 MG PO TABS: 5.0000 mg | ORAL_TABLET | ORAL | 0 refills | Status: DC | PRN

## 2016-07-12 MED ORDER — SODIUM CHLORIDE 0.9% FLUSH
3.0000 mL | INTRAVENOUS | Status: DC | PRN
Start: 2016-07-12 — End: 2016-07-12

## 2016-07-12 NOTE — Discharge Summary (Signed)
Physician Discharge Summary  Patient ID: Debra Griffith MRN: [id number] DOB/AGE: 74-Apr-1942 74 y.o.  Admit date: 07/11/2016 Discharge date: 07/12/2016  Admission Diagnoses: Left breast cancer, necrosis left nipple areola  Discharge Diagnoses:  Active Problems:   Breast cancer, left breast [facility])   Discharged Condition: stable  Hospital Course: Patient admitted for debridement left breast following bilateral oncoplastic reduction, left lumpectomy for treatment breast cancer. Postoperatively patient did well with minimal pain, tolerating diet.   Treatments: surgery: debridement left breast  Discharge Exam: Blood pressure (!) 142/55, pulse 93, temperature 98.1 F (36.7 C), temperature source Oral, resp. rate 20, height 5' (1.524 m), weight 130.6 kg (287 lb 14.7 oz), SpO2 92 %. Incision/Wound: intact with scant drainage, breast soft  Disposition: 01-Home or Self Care  Discharge Instructions    Call MD for:  redness, tenderness, or signs of infection (pain, swelling, bleeding, redness, odor or green/yellow discharge around incision site)    Complete by:  As directed    Call MD for:  temperature >100.5    Complete by:  As directed    Discharge instructions    Complete by:  As directed    Ok to remove dressings and shower am 10.27.17. Soap and water ok, pat incisions dry. Dry dressing over left breast as desired.  Breast binder or soft compression bra all other times.  Ok to raise arms above shoulders for bathing and dressing.  No house yard work or exercise until cleared by MD.   Driving Restrictions    Complete by:  As directed    No driving if taking narcotics   Lifting restrictions    Complete by:  As directed    No lifting greater than 5-10 lbs through follow up   Resume previous diet    Complete by:  As directed        Medication List    TAKE these medications   acetaminophen 325 MG tablet Commonly known as:  TYLENOL Take 2 tablets (650 mg total) by mouth every  6 (six) hours as needed for mild pain (or Fever >/= 101).   albuterol 108 (90 Base) MCG/ACT inhaler Commonly known as:  PROVENTIL HFA;VENTOLIN HFA Inhale 2 puffs into the lungs every 6 (six) hours as needed. Wheezing and shortness of breath   amLODipine 10 MG tablet Commonly known as:  NORVASC Take 10 mg by mouth daily.   anastrozole 1 MG tablet Commonly known as:  ARIMIDEX Take 1 tablet (1 mg total) by mouth daily.   aspirin EC 81 MG tablet Take 81 mg by mouth daily. Will stop prior to surgery   B-12 500 MCG Tabs Take 500 mcg by mouth daily.   bisacodyl 10 MG suppository Commonly known as:  DULCOLAX Place 1 suppository (10 mg total) rectally daily as needed for moderate constipation.   buPROPion 150 MG 12 hr tablet Commonly known as:  WELLBUTRIN SR Take 150 mg by mouth every morning.   CALACLEAR 1-0.1 % Lotn Generic drug:  pramoxine-zinc acetate Apply 1 application topically 2 (two) times daily.   cholecalciferol 1000 units tablet Commonly known as:  VITAMIN D Take 1,000 Units by mouth daily.   clonazePAM 1 MG tablet Commonly known as:  KLONOPIN Take 1 mg by mouth at bedtime.   cyclobenzaprine 10 MG tablet Commonly known as:  FLEXERIL Take 20 mg by mouth at bedtime.   doxycycline 50 MG capsule Commonly known as:  VIBRAMYCIN Take 1 capsule (50 mg total) by mouth 2 (two) times daily.  famciclovir 500 MG tablet Commonly known as:  FAMVIR Take 500 mg by mouth 3 (three) times daily.   Fish Oil 1000 MG Caps Take 1,000 mg by mouth daily.   hydroxychloroquine 200 MG tablet Commonly known as:  PLAQUENIL Take 200 mg by mouth daily.   MAGNESIUM PO Take 2 tablets by mouth daily.   methocarbamol 500 MG tablet Commonly known as:  ROBAXIN Take 1 tablet (500 mg total) by mouth every 6 (six) hours as needed for muscle spasms.   metoprolol tartrate 25 MG tablet Commonly known as:  LOPRESSOR Take 25 mg by mouth 2 (two) times daily.   oxyCODONE 5 MG immediate  release tablet Commonly known as:  Oxy IR/ROXICODONE Take 1-2 tablets (5-10 mg total) by mouth every 4 (four) hours as needed for moderate pain. What changed:  Another medication with the same name was added. Make sure you understand how and when to take each.   oxyCODONE 5 MG immediate release tablet Commonly known as:  Oxy IR/ROXICODONE Take 1-2 tablets (5-10 mg total) by mouth every 4 (four) hours as needed for moderate pain. What changed:  You were already taking a medication with the same name, and this prescription was added. Make sure you understand how and when to take each.   triamterene-hydrochlorothiazide 37.5-25 MG tablet Commonly known as:  MAXZIDE-25 Take 1 tablet by mouth daily.   Turmeric 500 MG Caps Take 500 mg by mouth 2 (two) times daily.   valsartan 320 MG tablet Commonly known as:  DIOVAN Take 320 mg by mouth daily.   vitamin C 500 MG tablet Commonly known as:  ASCORBIC ACID Take 1,000 mg by mouth daily.   zolpidem 10 MG tablet Commonly known as:  AMBIEN Take 10 mg by mouth at bedtime.      Follow-up Information    Skie Vitrano, MD In 1 week.   Specialty:  Plastic Surgery Why:  as scheduled Contact information: [address] [place] [address] [phone]           Signed: Irene Limbo 07/12/2016, 7:51 AM

## 2016-07-12 NOTE — Progress Notes (Signed)
Pt discharged to home.  Discharge instructions explained to pt.  Pt has no questions at the time of discharge.  Pt states she has all belongings.  IV removed.  Pt taken off unit via wheelchair by volunteer services.   

## 2016-07-13 NOTE — Anesthesia Postprocedure Evaluation (Signed)
Anesthesia Post Note  Patient: Debra Griffith  Procedure(s) Performed: Procedure(s) (LRB): DEBRIDEMENT LEFT BREAST (Right)  Patient location during evaluation: PACU Anesthesia Type: General Level of consciousness: sedated and patient cooperative Pain management: pain level controlled Vital Signs Assessment: post-procedure vital signs reviewed and stable Respiratory status: spontaneous breathing Cardiovascular status: stable Anesthetic complications: no    Last Vitals:  Vitals:   07/12/16 0501 07/12/16 0955  BP: (!) 142/55 115/62  Pulse: 93 98  Resp: 20   Temp: 36.7 C     Last Pain:  Vitals:   07/12/16 0955  TempSrc:   PainSc: [place]

## 2016-07-18 ENCOUNTER — Ambulatory Visit: Payer: Medicare Other

## 2016-07-18 ENCOUNTER — Telehealth (INDEPENDENT_AMBULATORY_CARE_PROVIDER_SITE_OTHER): Payer: Self-pay | Admitting: *Deleted

## 2016-07-18 ENCOUNTER — Ambulatory Visit: Payer: Medicare Other | Admitting: Radiation Oncology

## 2016-07-18 DIAGNOSIS — M2550 Pain in unspecified joint: Secondary | ICD-10-CM

## 2016-07-18 DIAGNOSIS — Z79899 Other long term (current) drug therapy: Secondary | ICD-10-CM

## 2016-07-18 NOTE — Telephone Encounter (Signed)
I have advised patient lab orders are in computer, sent to [facility]

## 2016-07-18 NOTE — Telephone Encounter (Signed)
Patient called and states she misplaced her Lab requisition order and she need to get her lab drawn. She is wondering if we can send the orders to [facility].  She is getting her labs drawn next week. Please advise. Thank you

## 2016-07-20 ENCOUNTER — Other Ambulatory Visit: Payer: Self-pay | Admitting: *Deleted

## 2016-07-20 ENCOUNTER — Ambulatory Visit (HOSPITAL_BASED_OUTPATIENT_CLINIC_OR_DEPARTMENT_OTHER): Payer: Medicare Other | Admitting: Oncology

## 2016-07-20 VITALS — BP 124/59 | HR 77 | Temp 98.3°F | Resp 18 | Ht 60.0 in | Wt 275.7 lb

## 2016-07-20 DIAGNOSIS — C50912 Malignant neoplasm of unspecified site of left female breast: Secondary | ICD-10-CM

## 2016-07-20 DIAGNOSIS — C50212 Malignant neoplasm of upper-inner quadrant of left female breast: Secondary | ICD-10-CM | POA: Diagnosis not present

## 2016-07-20 DIAGNOSIS — C50012 Malignant neoplasm of nipple and areola, left female breast: Secondary | ICD-10-CM

## 2016-07-20 DIAGNOSIS — Z170 Estrogen receptor positive status [ER+]: Principal | ICD-10-CM

## 2016-07-20 DIAGNOSIS — Z23 Encounter for immunization: Secondary | ICD-10-CM | POA: Diagnosis present

## 2016-07-20 MED ORDER — INFLUENZA VAC SPLIT QUAD 0.5 ML IM SUSY
0.5000 mL | PREFILLED_SYRINGE | Freq: Once | INTRAMUSCULAR | Status: AC
  Administered 2016-07-20: 0.5 mL via INTRAMUSCULAR
  Filled 2016-07-20: qty 0.5

## 2016-07-20 MED ORDER — ANASTROZOLE 1 MG PO TABS: 1.0000 mg | ORAL_TABLET | Freq: Every day | ORAL | 6 refills | Status: DC

## 2016-07-20 NOTE — Progress Notes (Signed)
[facility]  Telephone:(336) [phone] Fax:(336) [phone]     ID: Debra Griffith DOB: May 20, 1941  MR#: [id number]  ONG#:[id number]  Patient Care Team: Josetta Huddle, MD as PCP - General (Internal Medicine) Chauncey Cruel, MD as Consulting Physician (Oncology) Rolm Bookbinder, MD as Consulting Physician (General Surgery) Irene Limbo, MD as Consulting Physician (Plastic Surgery) Gaynelle Arabian, MD as Consulting Physician (Orthopedic Surgery) Bo Merino, MD as Consulting Physician (Rheumatology)  OTHER MD:  CHIEF COMPLAINT: Estrogen receptor positive breast cancer  CURRENT TREATMENT: Adjuvant radiation pending   BREAST CANCER HISTORY: From the original intake note:  Debra Griffith had minor trauma to the left breast but did not think much about it until while swimming on the medication she felt a hard mass in her left breast. She brought it to medical attention and on 05/04/2016 she underwent left diagnostic mammography with tomography at the breast Center. This showed the breast density to be category B. In the left breast upper inner quadrant there was a persistent area of asymmetry measuring 4.9 cm. On exam this was a firm but poorly defined palpable mass. Biopsy of this mass 05/04/2016 showed ([id number]) an invasive ductal carcinoma, grade 1, estrogen receptor 90% positive with moderate staining intensity, progesterone receptor 90% positive with strong staining intensity, with an MIB-1 of 10%, and no HER-2 amplification, the signals ratio being 1.19 and the number per cell 1.90.  On 05/09/2016 the patient had ultrasonography of the left axilla which was benign. On the same day she had a second biopsy of what is either a second mass or a distant area of the same mass (in the same quadrant) and this showed ([id number]) invasive ductal carcinoma, grade 2, estrogen receptor 100% positive, progesterone receptor 90% positive, both with strong staining intensity, with an  MIB-1 of 15%, and no HER-2 amplification, the signals ratio being 1.50 and the number per cell 3.15.  The patient's subsequent history is as detailed below.  INTERVAL HISTORY: Debra Griffith returns today for follow-up of her estrogen receptor positive tumor accompanied by her husband [name]. She continues on anastrozole, with excellent tolerance. Hot flashes and vaginal dryness are not a major issue. She never developed the arthralgias or myalgias that many patients can experience on this medication. She obtains it at a good price.  Since her last visit here she underwent left lumpectomy and sentinel lymph node sampling 06/13/2016. The final pathology (SAA [id number]) showed multifocal invasive ductal carcinoma, grade 1, measuring 1.7 and 1.5 cm, with isolated tumor cells in 1 of 2 sentinel lymph nodes (pN0). The anterior margin was very close but negative.  On 06/25/2016 she underwent bilateral breast reduction with oncoplastic reconstruction of the left breast. The pathology from this procedure ([id number]) showed no evidence of residual malignancy. Unfortunately she had necrosis of the left nipple/ areola area and underwent debridement of this area 07/11/2016. The pathology ([id number] [id number]) again showed only inflammation and necrosis.  An Oncotype was obtained from the 06/13/2016 sample and this showed a score of 18, predicting a 10 year risk of recurrence outside the breast of 11% if the patient's only systemic therapy was tamoxifen for 5 years. It also predicts no significant benefit from chemotherapy.  Her case was presented in 07/04/2016 at conference. At that time it was decided she would benefit from adjuvant radiawhile continuing onatase inhibitor.  REVIEW OF SYSTEMS: She feels tired, and furthermore she smells something coming from her surgical breast which she has been assured is not a smell of  cancer, but a smell of "chicken fat". She is healing by secondary intention and [name] has become very  expert at changing the bandages. In addition to all the surgical problems she had shingles. She was treated initially with Valtrex. Those symptoms are now pretty much resolved. She feels extremely fatigued however. She can't walk for referral. She has back and joint pain. This is not new but it feels worse. Her vision is poor because of macular degeneration problems and her Avastin is currently being held because of postop healing issues. Aside from these problems a detailed review of systems today was stable  PAST MEDICAL HISTORY: Past Medical History:  Diagnosis Date  . Anemia    hx of   . Anxiety   . Arthritis    OA  . Cancer [facility])    left breast CA   . Complication of anesthesia    severe diarrhea and vomiting after hysterectomy   . Complication of anesthesia    trouble waking up   . DDD (degenerative disc disease), lumbar   . Depression   . Dysrhythmia    OVER 10 YRS AGO - SAW CARDIOLOGIST FOR IRREGULAR HEART BEAT - NO TREATMENT NEEDED AND HAS NOT HAD TO SEE CARDIOLOGIST SINCE.  Marland Kitchen GERD (gastroesophageal reflux disease)   . H/O bronchitis    FLARE UPS USUALLY ONCE A YEAR  . Hypertension   . Insomnia   . Macular degeneration of right eye   . Obesity   . PONV (postoperative nausea and vomiting)    SEVERE N&V AND DIARRHEA AFTER HYSTERECTOMY AND AFTER WISDOM TEETH EXTRACTIONS - NO PROBLEMS WITH LAST 2 SURGERIES - THE HIP AND LEFT KNEE  . Right bundle branch block   . S/p dental crown    dental crowns on every tooth  . Vitamin D deficiency     PAST SURGICAL HISTORY: Past Surgical History:  Procedure Laterality Date  . ABDOMINAL HYSTERECTOMY    . AXILLARY SURGERY Left 06/25/2016   Aspiration of left axillary seroma   . BREAST LUMPECTOMY WITH RADIOACTIVE SEED AND SENTINEL LYMPH NODE BIOPSY Left 06/13/2016   Procedure: LEFT BREAST LUMPECTOMY WITH BRACKETED  RADIOACTIVE SEED AND SENTINEL LYMPH NODE BIOPSY;  Surgeon: Rolm Bookbinder, MD;  Location: [place];  Service: General;   Laterality: Left;  . BREAST RECONSTRUCTION Bilateral 06/25/2016   BILATERAL ONCOPLASTIC BREAST RECONSTRUCTION WITH BREAST REDUCTION  . BREAST RECONSTRUCTION WITH PLACEMENT OF TISSUE EXPANDER AND FLEX HD (ACELLULAR HYDRATED DERMIS) Bilateral 06/25/2016   Procedure: BILATERAL ONCOPLASTIC BREAST RECONSTRUCTION WITH BREAST REDUCTION, Aspiration of left axillary seroma;  Surgeon: Irene Limbo, MD;  Location: [facility];  Service: Plastics;  Laterality: Bilateral;  . BREAST REDUCTION SURGERY Bilateral 06/25/2016   Procedure: MAMMARY REDUCTION  (BREAST) BILATERAL;  Surgeon: Irene Limbo, MD;  Location: [facility];  Service: Plastics;  Laterality: Bilateral;  . CESAREAN SECTION     x 3  . COLONOSCOPY    . DEBRIDEMENT AND CLOSURE WOUND Right 07/11/2016   Procedure: DEBRIDEMENT LEFT BREAST;  Surgeon: Irene Limbo, MD;  Location: [facility];  Service: Plastics;  Laterality: Right;  . JOINT REPLACEMENT    . REDUCTION MAMMAPLASTY Bilateral 06/25/2016  . TOTAL HIP ARTHROPLASTY  06/24/2012   Procedure: TOTAL HIP ARTHROPLASTY;  Surgeon: [name], MD;  Location: [facility];  Service: Orthopedics;  Laterality: Left;  . TOTAL KNEE ARTHROPLASTY N/A 11/02/2013   Procedure: LEFT TOTAL KNEE ARTHROPLASTY WITH RIGHT KNEE CORTISONE INJECTION;  Surgeon: [name], MD;  Location: [facility];  Service:  Orthopedics;  Laterality: N/A;  . TOTAL KNEE ARTHROPLASTY Right 05/31/2014   Procedure: RIGHT TOTAL KNEE ARTHROPLASTY;  Surgeon: [name], MD;  Location: [facility];  Service: Orthopedics;  Laterality: Right;  . WISDOM TOOTH EXTRACTION  1970's   admitted to hospital for surgery    FAMILY HISTORY No family history on file.  The patient's mother died at age 69. The patient's father died at age 31 following a stroke. The patient had no brothers, 1 sister. There is no history of breast or ovarian cancer in the family.   GYNECOLOGIC HISTORY:  No LMP recorded. Patient has had a hysterectomy. Menarche age 65, first live  birth age 59. The patient is GX P3. She underwent total abdominal hysterectomy, with bilateral salpingo-oophorectomy, in 1993. She used estrogen replacement approximately 9 months. She never used oral contraceptives.  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 Her husband [name] used to run for season small and similar businesses but he also is retired. Son Aaron Edelman is a Programme researcher, broadcasting/film/video in [place]. Son Legrand Como is a Engineer, maintenance (IT) in [employer]. Daughter Rhia Blatchford lives in [place]. 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 The patient has 5 grandchildren. 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    ADVANCED DIRECTIVES: In place   HEALTH MAINTENANCE: Social History  Substance Use Topics  . Smoking status: Never Smoker  . Smokeless tobacco: Never Used  . Alcohol use No     Colonoscopy:2015/[facility]  PAP:  Bone density: Remote   Allergies  Allergen Reactions  . Morphine And Related     hallucinations    Current Outpatient Prescriptions  Medication Sig Dispense Refill  . amLODipine (NORVASC) 10 MG tablet Take 10 mg by mouth daily.     Marland Kitchen anastrozole (ARIMIDEX) 1 MG tablet Take 1 tablet (1 mg total) by mouth daily. 30 tablet 6  . aspirin EC 81 MG tablet Take 81 mg by mouth daily. Will stop prior to surgery    . bisacodyl (DULCOLAX) 10 MG suppository Place 1 suppository (10 mg total) rectally daily as needed for moderate constipation. (Patient not taking: Reported on 06/07/2016) 12 suppository 0  . buPROPion (WELLBUTRIN SR) 150 MG 12 hr tablet Take 150 mg by mouth every morning.    . cholecalciferol (VITAMIN D) 1000 units tablet Take 1,000 Units by mouth daily.    . clonazePAM (KLONOPIN) 1 MG tablet Take 1 mg by mouth at bedtime.     . Cyanocobalamin (B-12) 500 MCG TABS Take 500 mcg by mouth daily.    Marland Kitchen MAGNESIUM PO Take 2 tablets by mouth daily.    . metoprolol tartrate (LOPRESSOR) 25 MG tablet Take 25 mg by mouth 2  (two) times daily.     . Omega-3 Fatty Acids (FISH OIL) 1000 MG CAPS Take 1,000 mg by mouth daily.    . pramoxine-zinc acetate (CALACLEAR) 1-0.1 % LOTN Apply 1 application topically 2 (two) times daily.    Marland Kitchen triamterene-hydrochlorothiazide (MAXZIDE-25) 37.5-25 MG tablet Take 1 tablet by mouth daily.     . Turmeric 500 MG CAPS Take 500 mg by mouth 2 (two) times daily.    . valsartan (DIOVAN) 320 MG tablet Take 320 mg by mouth daily.     . vitamin C (ASCORBIC ACID) 500 MG tablet Take 1,000 mg by mouth daily.    Marland Kitchen zolpidem (AMBIEN) 10 MG tablet Take 10 mg by mouth at bedtime.      No  current facility-administered medications for this visit.     OBJECTIVE:Morbidly obese older white woman using a chair walker Vitals:   07/20/16 1031  BP: (!) 124/59  Pulse: 77  Resp: 18  Temp: 98.3 F (36.8 C)     Body mass index is 53.84 kg/m.    ECOG FS:1 - Symptomatic but completely ambulatory  Sclerae unicteric, EOMs intact Oropharynx clear and moist No cervical or supraclavicular adenopathy Lungs no rales or rhonchi Heart regular rate and rhythm Abd soft, obese, nontender, positive bowel sounds MSK scoliosis but no focal spinal tenderness Neuro: nonfocal, well oriented, appropriate affect Breasts: The right breast is status post reduction mammoplasty.. The left breast is status post lumpectomy and reduction mammoplasty and debridement of the nipple areolar complex. The wounds are shallow and healing well by secondary intention. The left axilla was benign.  LAB RESULTS:  CMP     Component Value Date/Time   NA 134 (L) 07/11/2016 1531   NA 137 05/31/2016 1541   K 3.9 07/11/2016 1531   K 4.1 05/31/2016 1541   CL 98 (L) 07/11/2016 1531   CO2 24 07/11/2016 1531   CO2 24 05/31/2016 1541   GLUCOSE 124 (H) 07/11/2016 1531   GLUCOSE 94 05/31/2016 1541   BUN 17 07/11/2016 1531   BUN 22.9 05/31/2016 1541   CREATININE 1.38 (H) 07/11/2016 1531   CREATININE 1.3 (H) 05/31/2016 1541   CALCIUM 9.3  07/11/2016 1531   CALCIUM 9.6 05/31/2016 1541   PROT 6.9 05/31/2016 1541   ALBUMIN 3.7 05/31/2016 1541   AST 20 05/31/2016 1541   ALT 30 05/31/2016 1541   ALKPHOS 79 05/31/2016 1541   BILITOT <0.30 05/31/2016 1541   GFRNONAA 37 (L) 07/11/2016 1531   GFRAA 42 (L) 07/11/2016 1531    INo results found for: SPEP, UPEP  Lab Results  Component Value Date   WBC 7.7 07/11/2016   NEUTROABS 6.5 05/31/2016   HGB 10.5 (L) 07/11/2016   HCT 32.7 (L) 07/11/2016   MCV 87.9 07/11/2016   PLT 345 07/11/2016      Chemistry      Component Value Date/Time   NA 134 (L) 07/11/2016 1531   NA 137 05/31/2016 1541   K 3.9 07/11/2016 1531   K 4.1 05/31/2016 1541   CL 98 (L) 07/11/2016 1531   CO2 24 07/11/2016 1531   CO2 24 05/31/2016 1541   BUN 17 07/11/2016 1531   BUN 22.9 05/31/2016 1541   CREATININE 1.38 (H) 07/11/2016 1531   CREATININE 1.3 (H) 05/31/2016 1541      Component Value Date/Time   CALCIUM 9.3 07/11/2016 1531   CALCIUM 9.6 05/31/2016 1541   ALKPHOS 79 05/31/2016 1541   AST 20 05/31/2016 1541   ALT 30 05/31/2016 1541   BILITOT <0.30 05/31/2016 1541       No results found for: LABCA2  No components found for: OTLXB262  No results for input(s): INR in the last 168 hours.  Urinalysis    Component Value Date/Time   COLORURINE YELLOW 05/26/2014 1054   APPEARANCEUR CLEAR 05/26/2014 1054   LABSPEC 1.014 05/26/2014 1054   PHURINE 6.5 05/26/2014 1054   GLUCOSEU NEGATIVE 05/26/2014 1054   HGBUR NEGATIVE 05/26/2014 1054   BILIRUBINUR NEGATIVE 05/26/2014 1054   KETONESUR NEGATIVE 05/26/2014 1054   PROTEINUR NEGATIVE 05/26/2014 1054   UROBILINOGEN 0.2 05/26/2014 1054   NITRITE POSITIVE (A) 05/26/2014 1054   LEUKOCYTESUR NEGATIVE 05/26/2014 1054     STUDIES: No results found.  ELIGIBLE FOR AVAILABLE  RESEARCH PROTOCOL: no  ASSESSMENT: 75 y.o.  woman status post left breast upper inner quadrant biopsy 05/04/2016 for a clinical T2 N0 invasive ductal carcinoma,  grade 1 estrogen and progesterone receptor positive, HER-2 nonamplified, with an MIB-1 of 10%.  (1) biopsy of a second area 05/09/2016 also in the upper inner quadrant of the left breast showed invasive ductal carcinoma, grade 2, estrogen and progesterone receptor positive, HER-2 nonamplified, with an MIB-1 of 15%.  (2) status post left lumpectomy and sentinel lymph node sampling 06/13/2016 for an mpT1c pN0(i+), stage IA invasive ductal carcinoma, grade 1, with close but negative margins  (a) status post bilateral reduction mammoplasty with left oncoplastic 06/25/2016  (b) status post debridement of left nipple/areolar necrosis 07/11/2016  (3) Oncotype score of 18 predicts a 10 year risk of recurrence outside the breast of 11% if the patient's only systemic therapy is tamoxifen for 5 years. It also predicts no significant benefit from chemotherapy  (4) adjuvant radiation to follow   (5) started anastrozole September 2017, continuing through surgery and radiation treatments  PLAN: Terease has had a rough time of that but given the possible problems due to her morbid obesity she has come through well. She is healing from her most recent surgery and I believe we will be able to start radiation treatments before the end of this month.  She has been on anastrozole since September. She is tolerating this well. I think it would be prudent to continue this right through radiation given all the delays and complications in her treatment. She is also tolerating it quite well.  She will see me again towards the end of January. Hopefully she will be done with her local treatment and recovered from it sufficiently so that we can start long-term follow-up  She knows to call for any problems that may develop before her next visit here.    Chauncey Cruel, MD   07/21/2016 5:58 PM Medical Oncology and Hematology [facility] [address] [place], [place] [address] Tel. [phone]     Fax. [phone]

## 2016-07-27 ENCOUNTER — Encounter: Payer: Self-pay | Admitting: *Deleted

## 2016-07-27 ENCOUNTER — Other Ambulatory Visit: Payer: Self-pay | Admitting: General Surgery

## 2016-07-27 DIAGNOSIS — I4510 Unspecified right bundle-branch block: Secondary | ICD-10-CM

## 2016-07-27 DIAGNOSIS — M19072 Primary osteoarthritis, left ankle and foot: Secondary | ICD-10-CM

## 2016-07-27 DIAGNOSIS — M19071 Primary osteoarthritis, right ankle and foot: Secondary | ICD-10-CM

## 2016-07-27 DIAGNOSIS — M19041 Primary osteoarthritis, right hand: Secondary | ICD-10-CM

## 2016-07-27 DIAGNOSIS — M1990 Unspecified osteoarthritis, unspecified site: Secondary | ICD-10-CM

## 2016-07-27 DIAGNOSIS — M19042 Primary osteoarthritis, left hand: Secondary | ICD-10-CM

## 2016-07-27 DIAGNOSIS — M5136 Other intervertebral disc degeneration, lumbar region: Secondary | ICD-10-CM | POA: Insufficient documentation

## 2016-07-27 DIAGNOSIS — N189 Chronic kidney disease, unspecified: Secondary | ICD-10-CM

## 2016-07-27 HISTORY — DX: Unspecified right bundle-branch block: I45.10

## 2016-07-27 HISTORY — DX: Chronic kidney disease, unspecified: N18.9

## 2016-07-27 HISTORY — DX: Unspecified osteoarthritis, unspecified site: M19.90

## 2016-07-27 HISTORY — DX: Primary osteoarthritis, left hand: M19.041

## 2016-07-27 HISTORY — DX: Primary osteoarthritis, left ankle and foot: M19.071

## 2016-07-27 NOTE — Progress Notes (Deleted)
Office Visit Note  Patient: Debra Griffith             Date of Birth: [birth year]           MRN: [id number]             PCP: Henrine Screws, MD Referring: Josetta Huddle, MD Visit Date: 07/30/2016 Occupation: _0 @    Subjective:  No chief complaint on file.   History of Present Illness: Debra Griffith is a 75 y.o. female ***   Activities of Daily Living:  Patient reports morning stiffness for *** {minute/hour:19697}.   Patient {ACTIONS;DENIES/REPORTS:21021675::"Denies"} nocturnal pain.  Difficulty dressing/grooming: {ACTIONS;DENIES/REPORTS:21021675::"Denies"} Difficulty climbing stairs: {ACTIONS;DENIES/REPORTS:21021675::"Denies"} Difficulty getting out of chair: {ACTIONS;DENIES/REPORTS:21021675::"Denies"} Difficulty using hands for taps, buttons, cutlery, and/or writing: {ACTIONS;DENIES/REPORTS:21021675::"Denies"}   No Rheumatology ROS completed.   PMFS History:  Patient Active Problem List   Diagnosis Date Noted  . RBBB (right bundle branch block) 07/27/2016  . CRI (chronic renal insufficiency) 07/27/2016  . DDD (degenerative disc disease), lumbar 07/27/2016  . Primary osteoarthritis of both feet 07/27/2016  . Primary osteoarthritis of both hands 07/27/2016  . Inflammatory arthritis 07/27/2016  . Breast cancer, left breast ([place]) 06/25/2016  . Malignant neoplasm of left female breast ([place]) 06/13/2016  . Breast cancer of upper-inner quadrant of left female breast ([place]) 05/31/2016  . Depression 06/03/2014  . Constipation 11/19/2013  . Insomnia 11/19/2013  . Anxiety 11/19/2013  . Status post total bilateral knee replacement 11/13/2013  . Hypertension   . GERD (gastroesophageal reflux disease)   . OA (osteoarthritis) of knee 11/02/2013  . Postop Sinus tachycardia 06/27/2012  . Postop Hyponatremia 06/25/2012  . Postop Acute blood loss anemia 06/25/2012  . OA (osteoarthritis) of hip 06/24/2012    Past Medical History:  Diagnosis Date  . Anemia    hx of    . Anxiety   . Arthritis    OA  . Cancer [facility])    left breast CA   . Complication of anesthesia    severe diarrhea and vomiting after hysterectomy   . Complication of anesthesia    trouble waking up   . CRI (chronic renal insufficiency) 07/27/2016  . DDD (degenerative disc disease), lumbar   . Depression   . Dysrhythmia    OVER 10 YRS AGO - SAW CARDIOLOGIST FOR IRREGULAR HEART BEAT - NO TREATMENT NEEDED AND HAS NOT HAD TO SEE CARDIOLOGIST SINCE.  Marland Kitchen GERD (gastroesophageal reflux disease)   . H/O bronchitis    FLARE UPS USUALLY ONCE A YEAR  . Hypertension   . Inflammatory arthritis 07/27/2016   Sero Negative, Positive Synovitis hands, WJ  . Insomnia   . Macular degeneration of right eye   . Obesity   . Osteoarthritis of both feet 07/27/2016  . Osteoarthritis of both hands 07/27/2016  . PONV (postoperative nausea and vomiting)    SEVERE N&V AND DIARRHEA AFTER HYSTERECTOMY AND AFTER WISDOM TEETH EXTRACTIONS - NO PROBLEMS WITH LAST 2 SURGERIES - THE HIP AND LEFT KNEE  . RBBB (right bundle branch block) 07/27/2016  . Right bundle branch block   . S/p dental crown    dental crowns on every tooth  . Vitamin D deficiency     No family history on file. Past Surgical History:  Procedure Laterality Date  . ABDOMINAL HYSTERECTOMY    . AXILLARY SURGERY Left 06/25/2016   Aspiration of left axillary seroma   . BREAST LUMPECTOMY WITH RADIOACTIVE SEED AND SENTINEL LYMPH NODE BIOPSY Left 06/13/2016   Procedure: LEFT BREAST  LUMPECTOMY WITH BRACKETED  RADIOACTIVE SEED AND SENTINEL LYMPH NODE BIOPSY;  Surgeon: Rolm Bookbinder, MD;  Location: [place];  Service: General;  Laterality: Left;  . BREAST RECONSTRUCTION Bilateral 06/25/2016   BILATERAL ONCOPLASTIC BREAST RECONSTRUCTION WITH BREAST REDUCTION  . BREAST RECONSTRUCTION WITH PLACEMENT OF TISSUE EXPANDER AND FLEX HD (ACELLULAR HYDRATED DERMIS) Bilateral 06/25/2016   Procedure: BILATERAL ONCOPLASTIC BREAST RECONSTRUCTION WITH BREAST REDUCTION,  Aspiration of left axillary seroma;  Surgeon: Irene Limbo, MD;  Location: [place];  Service: Plastics;  Laterality: Bilateral;  . BREAST REDUCTION SURGERY Bilateral 06/25/2016   Procedure: MAMMARY REDUCTION  (BREAST) BILATERAL;  Surgeon: Irene Limbo, MD;  Location: [place];  Service: Plastics;  Laterality: Bilateral;  . CESAREAN SECTION     x 3  . COLONOSCOPY    . DEBRIDEMENT AND CLOSURE WOUND Right 07/11/2016   Procedure: DEBRIDEMENT LEFT BREAST;  Surgeon: Irene Limbo, MD;  Location: [place];  Service: Plastics;  Laterality: Right;  . JOINT REPLACEMENT    . REDUCTION MAMMAPLASTY Bilateral 06/25/2016  . TOTAL HIP ARTHROPLASTY  06/24/2012   Procedure: TOTAL HIP ARTHROPLASTY;  Surgeon: [name], MD;  [facility];  Service: Orthopedics;  Laterality: Left;  . TOTAL KNEE ARTHROPLASTY N/A 11/02/2013   Procedure: LEFT TOTAL KNEE ARTHROPLASTY WITH RIGHT KNEE CORTISONE INJECTION;  Surgeon: [name], MD;  [facility];  Service: Orthopedics;  Laterality: N/A;  . TOTAL KNEE ARTHROPLASTY Right 05/31/2014   Procedure: RIGHT TOTAL KNEE ARTHROPLASTY;  Surgeon: [name], MD;  [facility];  Service: Orthopedics;  Laterality: Right;  . WISDOM TOOTH EXTRACTION  1970's   admitted to hospital for surgery   Social History   Social History Narrative  . No narrative on file     Objective: Vital Signs: There were no vitals taken for this visit.   Physical Exam   Musculoskeletal Exam: ***  CDAI Exam: No CDAI exam completed.    Investigation: Findings:  04/21/2014 GFR 48, uric acid 8.8, ace negative, RF negative, CCP negative, ANA negative, ESR normal, G6PD normal, May 5 11/07/2015: CMP creatinine 1.24 GFR 43, CBC normal, 06/06/2015 Plaquenil eye exam normal    Imaging: No results found.  Speciality Comments: No specialty comments available.    Procedures:  No procedures performed Allergies: Morphine and related   Assessment / Plan: Visit Diagnoses:  Inflammatory arthritis  High risk medication use - [id number] M-F  Primary osteoarthritis of both hips - LTHR  Status post total bilateral knee replacement  Primary osteoarthritis of both hands  Primary osteoarthritis of both feet  Osteoarthritis of lumbar spine, unspecified spinal osteoarthritis complication status - With a spinal stenosis  Essential hypertension  Primary insomnia  Anxiety  Depression, unspecified depression type  Chronic renal impairment, unspecified CKD stage  Malignant neoplasm of upper-inner quadrant of left breast in female, estrogen receptor positive ([place]    Orders: No orders of the defined types were placed in this encounter.  No orders of the defined types were placed in this encounter.   Face-to-face time spent with patient was *** minutes. 50% of time was spent in counseling and coordination of care.  Follow-Up Instructions: No Follow-up on file.   SBo Merino MD

## 2016-07-30 ENCOUNTER — Ambulatory Visit: Payer: BLUE CROSS/BLUE SHIELD | Admitting: Rheumatology

## 2016-07-30 ENCOUNTER — Telehealth: Payer: Self-pay | Admitting: Rheumatology

## 2016-07-30 NOTE — Telephone Encounter (Signed)
seronegative inflammatory arthritis, she uses PLQ and wants you to be aware her surgeon has asked her to d/c this for upcoming surgery for Breast CA  FYI

## 2016-07-30 NOTE — Telephone Encounter (Signed)
Patient would like Dr. Estanislado Pandy to know that she was taken off of plaquenil due to having a surgery on Thursday for her breast cancer. Just an fyi.

## 2016-08-01 ENCOUNTER — Encounter (HOSPITAL_COMMUNITY): Payer: Self-pay | Admitting: *Deleted

## 2016-08-01 MED ORDER — DEXTROSE 5 % IV SOLN
3.0000 g | INTRAVENOUS | Status: DC
  Filled 2016-08-01: qty 3000

## 2016-08-01 NOTE — Progress Notes (Signed)
Spoke with pt for pre-op call. Denies any cardiac history, chest pain or sob.

## 2016-08-02 ENCOUNTER — Ambulatory Visit (HOSPITAL_COMMUNITY): Payer: Medicare Other | Admitting: Anesthesiology

## 2016-08-02 ENCOUNTER — Encounter (HOSPITAL_COMMUNITY)
Admission: RE | Disposition: A | Discharge status: Home / Self Care | Payer: Self-pay | Source: Ambulatory Visit | Attending: General Surgery

## 2016-08-02 ENCOUNTER — Encounter (HOSPITAL_COMMUNITY): Payer: Self-pay | Admitting: Anesthesiology

## 2016-08-02 ENCOUNTER — Observation Stay (HOSPITAL_COMMUNITY)
Admission: RE | Admit: 2016-08-02 | Discharge: 2016-08-03 | Disposition: A | Discharge status: Home / Self Care | Payer: Medicare Other | Source: Ambulatory Visit | Attending: General Surgery | Admitting: General Surgery

## 2016-08-02 DIAGNOSIS — M5136 Other intervertebral disc degeneration, lumbar region: Secondary | ICD-10-CM | POA: Diagnosis not present

## 2016-08-02 DIAGNOSIS — D631 Anemia in chronic kidney disease: Secondary | ICD-10-CM | POA: Insufficient documentation

## 2016-08-02 DIAGNOSIS — C50912 Malignant neoplasm of unspecified site of left female breast: Secondary | ICD-10-CM | POA: Diagnosis not present

## 2016-08-02 DIAGNOSIS — S21002A Unspecified open wound of left breast, initial encounter: Secondary | ICD-10-CM | POA: Diagnosis not present

## 2016-08-02 DIAGNOSIS — I129 Hypertensive chronic kidney disease with stage 1 through stage 4 chronic kidney disease, or unspecified chronic kidney disease: Secondary | ICD-10-CM | POA: Insufficient documentation

## 2016-08-02 DIAGNOSIS — K219 Gastro-esophageal reflux disease without esophagitis: Secondary | ICD-10-CM | POA: Insufficient documentation

## 2016-08-02 DIAGNOSIS — N189 Chronic kidney disease, unspecified: Secondary | ICD-10-CM | POA: Insufficient documentation

## 2016-08-02 DIAGNOSIS — M19042 Primary osteoarthritis, left hand: Secondary | ICD-10-CM | POA: Diagnosis not present

## 2016-08-02 DIAGNOSIS — Z885 Allergy status to narcotic agent status: Secondary | ICD-10-CM | POA: Insufficient documentation

## 2016-08-02 DIAGNOSIS — H3530 Unspecified macular degeneration: Secondary | ICD-10-CM | POA: Insufficient documentation

## 2016-08-02 DIAGNOSIS — E559 Vitamin D deficiency, unspecified: Secondary | ICD-10-CM | POA: Diagnosis not present

## 2016-08-02 DIAGNOSIS — M19041 Primary osteoarthritis, right hand: Secondary | ICD-10-CM | POA: Insufficient documentation

## 2016-08-02 DIAGNOSIS — M064 Inflammatory polyarthropathy: Secondary | ICD-10-CM | POA: Insufficient documentation

## 2016-08-02 DIAGNOSIS — M19071 Primary osteoarthritis, right ankle and foot: Secondary | ICD-10-CM | POA: Insufficient documentation

## 2016-08-02 DIAGNOSIS — Z6841 Body Mass Index (BMI) 40.0 and over, adult: Secondary | ICD-10-CM | POA: Insufficient documentation

## 2016-08-02 DIAGNOSIS — F329 Major depressive disorder, single episode, unspecified: Secondary | ICD-10-CM | POA: Insufficient documentation

## 2016-08-02 DIAGNOSIS — F419 Anxiety disorder, unspecified: Secondary | ICD-10-CM | POA: Diagnosis not present

## 2016-08-02 DIAGNOSIS — N6012 Diffuse cystic mastopathy of left breast: Secondary | ICD-10-CM | POA: Diagnosis not present

## 2016-08-02 DIAGNOSIS — Z90710 Acquired absence of both cervix and uterus: Secondary | ICD-10-CM | POA: Insufficient documentation

## 2016-08-02 DIAGNOSIS — G8918 Other acute postprocedural pain: Secondary | ICD-10-CM | POA: Diagnosis not present

## 2016-08-02 DIAGNOSIS — I4510 Unspecified right bundle-branch block: Secondary | ICD-10-CM | POA: Insufficient documentation

## 2016-08-02 DIAGNOSIS — M19072 Primary osteoarthritis, left ankle and foot: Secondary | ICD-10-CM | POA: Diagnosis not present

## 2016-08-02 DIAGNOSIS — C50212 Malignant neoplasm of upper-inner quadrant of left female breast: Secondary | ICD-10-CM | POA: Diagnosis not present

## 2016-08-02 HISTORY — DX: Unspecified chronic bronchitis: J42

## 2016-08-02 HISTORY — DX: Rheumatoid arthritis, unspecified: M06.9

## 2016-08-02 HISTORY — PX: TOTAL MASTECTOMY: SHX6129

## 2016-08-02 HISTORY — DX: Cardiac arrhythmia, unspecified: I49.9

## 2016-08-02 HISTORY — DX: Malignant neoplasm of unspecified site of unspecified female breast: C50.919

## 2016-08-02 HISTORY — DX: Other chronic pain: G89.29

## 2016-08-02 HISTORY — PX: MASTECTOMY COMPLETE / SIMPLE: SUR845

## 2016-08-02 HISTORY — DX: Low back pain: M54.5

## 2016-08-02 HISTORY — DX: Low back pain, unspecified: M54.50

## 2016-08-02 LAB — BASIC METABOLIC PANEL
ANION GAP: 9 (ref 5–15)
BUN: 15 mg/dL (ref 6–20)
CO2: 26 mmol/L (ref 22–32)
Calcium: 9.7 mg/dL (ref 8.9–10.3)
Chloride: 101 mmol/L (ref 101–111)
Creatinine, Ser: 1.52 mg/dL — ABNORMAL HIGH (ref 0.44–1.00)
GFR calc Af Amer: 38 mL/min — ABNORMAL LOW (ref >60)
GFR, EST NON AFRICAN AMERICAN: 33 mL/min — AB (ref >60)
GLUCOSE: 127 mg/dL — AB (ref 65–99)
POTASSIUM: 3.2 mmol/L — AB (ref 3.5–5.1)
Sodium: 136 mmol/L (ref 135–145)

## 2016-08-02 LAB — CBC
HEMATOCRIT: 32.2 % — AB (ref 36.0–46.0)
HEMOGLOBIN: 10.1 g/dL — AB (ref 12.0–15.0)
MCH: 27.4 pg (ref 26.0–34.0)
MCHC: 31.4 g/dL (ref 30.0–36.0)
MCV: 87.5 fL (ref 78.0–100.0)
Platelets: 328 10*3/uL (ref 150–400)
RBC: 3.68 MIL/uL — ABNORMAL LOW (ref 3.87–5.11)
RDW: 14.1 % (ref 11.5–15.5)
WBC: 9.7 10*3/uL (ref 4.0–10.5)

## 2016-08-02 SURGERY — MASTECTOMY, SIMPLE
Anesthesia: Regional | Site: Breast | Laterality: Left

## 2016-08-02 MED ORDER — ACETAMINOPHEN 650 MG RE SUPP: 650.0000 mg | Freq: Four times a day (QID) | RECTAL | Status: DC | PRN

## 2016-08-02 MED ORDER — EPHEDRINE SULFATE-NACL 50-0.9 MG/10ML-% IV SOSY
PREFILLED_SYRINGE | INTRAVENOUS | Status: DC | PRN
  Administered 2016-08-02: 5 mg via INTRAVENOUS
  Administered 2016-08-02: 10 mg via INTRAVENOUS
  Administered 2016-08-02: 5 mg via INTRAVENOUS

## 2016-08-02 MED ORDER — METOPROLOL TARTRATE 25 MG PO TABS
25.0000 mg | ORAL_TABLET | Freq: Two times a day (BID) | ORAL | Status: DC
  Administered 2016-08-02: 25 mg via ORAL
  Filled 2016-08-02 (×2): qty 1

## 2016-08-02 MED ORDER — EPHEDRINE 5 MG/ML INJ
INTRAVENOUS | Status: AC
  Filled 2016-08-02: qty 10

## 2016-08-02 MED ORDER — BUPROPION HCL ER (SR) 150 MG PO TB12
150.0000 mg | ORAL_TABLET | Freq: Every morning | ORAL | Status: DC
  Administered 2016-08-03: 150 mg via ORAL
  Filled 2016-08-02: qty 1

## 2016-08-02 MED ORDER — LACTATED RINGERS IV SOLN
INTRAVENOUS | Status: DC
  Administered 2016-08-02 (×2): via INTRAVENOUS

## 2016-08-02 MED ORDER — CEFAZOLIN SODIUM-DEXTROSE 2-4 GM/100ML-% IV SOLN
2.0000 g | Freq: Three times a day (TID) | INTRAVENOUS | Status: AC
  Administered 2016-08-02 – 2016-08-03 (×2): 2 g via INTRAVENOUS
  Filled 2016-08-02 (×2): qty 100

## 2016-08-02 MED ORDER — METHOCARBAMOL 500 MG PO TABS: 500.0000 mg | ORAL_TABLET | Freq: Four times a day (QID) | ORAL | Status: DC | PRN

## 2016-08-02 MED ORDER — ONDANSETRON HCL 4 MG/2ML IJ SOLN: 4.0000 mg | Freq: Four times a day (QID) | INTRAMUSCULAR | Status: DC | PRN

## 2016-08-02 MED ORDER — PROPOFOL 10 MG/ML IV BOLUS
INTRAVENOUS | Status: AC
  Filled 2016-08-02: qty 20

## 2016-08-02 MED ORDER — MIDAZOLAM HCL 2 MG/2ML IJ SOLN
INTRAMUSCULAR | Status: AC
  Administered 2016-08-02: 0.9 mg via INTRAVENOUS
  Filled 2016-08-02: qty 2

## 2016-08-02 MED ORDER — FENTANYL CITRATE (PF) 100 MCG/2ML IJ SOLN
INTRAMUSCULAR | Status: AC
  Administered 2016-08-02: 25 ug via INTRAVENOUS
  Filled 2016-08-02: qty 2

## 2016-08-02 MED ORDER — ACETAMINOPHEN 325 MG PO TABS: 650.0000 mg | ORAL_TABLET | Freq: Four times a day (QID) | ORAL | Status: DC | PRN

## 2016-08-02 MED ORDER — ENOXAPARIN SODIUM 40 MG/0.4ML ~~LOC~~ SOLN
40.0000 mg | SUBCUTANEOUS | Status: DC
  Administered 2016-08-03: 40 mg via SUBCUTANEOUS
  Filled 2016-08-02: qty 0.4

## 2016-08-02 MED ORDER — NEOSTIGMINE METHYLSULFATE 10 MG/10ML IV SOLN
INTRAVENOUS | Status: DC | PRN
  Administered 2016-08-02: 4 mg via INTRAVENOUS

## 2016-08-02 MED ORDER — BUPIVACAINE-EPINEPHRINE (PF) 0.5% -1:200000 IJ SOLN
INTRAMUSCULAR | Status: DC | PRN
  Administered 2016-08-02: 30 mL

## 2016-08-02 MED ORDER — METOCLOPRAMIDE HCL 5 MG/ML IJ SOLN: 10.0000 mg | Freq: Once | INTRAMUSCULAR | Status: DC | PRN

## 2016-08-02 MED ORDER — ONDANSETRON HCL 4 MG/2ML IJ SOLN
INTRAMUSCULAR | Status: DC | PRN
  Administered 2016-08-02: 4 mg via INTRAVENOUS

## 2016-08-02 MED ORDER — PHENYLEPHRINE 40 MCG/ML (10ML) SYRINGE FOR IV PUSH (FOR BLOOD PRESSURE SUPPORT)
PREFILLED_SYRINGE | INTRAVENOUS | Status: DC | PRN
  Administered 2016-08-02 (×3): 80 ug via INTRAVENOUS

## 2016-08-02 MED ORDER — HYDROMORPHONE HCL 2 MG/ML IJ SOLN
1.0000 mg | INTRAMUSCULAR | Status: DC | PRN
  Administered 2016-08-02: 1 mg via INTRAVENOUS
  Filled 2016-08-02: qty 1

## 2016-08-02 MED ORDER — FENTANYL CITRATE (PF) 100 MCG/2ML IJ SOLN
INTRAMUSCULAR | Status: DC | PRN
Start: 2016-08-02 — End: 2016-08-02
  Administered 2016-08-02: 100 ug via INTRAVENOUS

## 2016-08-02 MED ORDER — PHENYLEPHRINE 40 MCG/ML (10ML) SYRINGE FOR IV PUSH (FOR BLOOD PRESSURE SUPPORT)
PREFILLED_SYRINGE | INTRAVENOUS | Status: AC
  Filled 2016-08-02: qty 10

## 2016-08-02 MED ORDER — AMLODIPINE BESYLATE 10 MG PO TABS
10.0000 mg | ORAL_TABLET | Freq: Every day | ORAL | Status: DC
  Filled 2016-08-02: qty 1

## 2016-08-02 MED ORDER — DEXTROSE 5 % IV SOLN
INTRAVENOUS | Status: DC | PRN
  Administered 2016-08-02: 3 g via INTRAVENOUS

## 2016-08-02 MED ORDER — IRBESARTAN 75 MG PO TABS
37.5000 mg | ORAL_TABLET | Freq: Every day | ORAL | Status: DC
  Administered 2016-08-03: 37.5 mg via ORAL
  Filled 2016-08-02: qty 1

## 2016-08-02 MED ORDER — FENTANYL CITRATE (PF) 100 MCG/2ML IJ SOLN
25.0000 ug | INTRAMUSCULAR | Status: DC | PRN
  Administered 2016-08-02 (×4): 25 ug via INTRAVENOUS

## 2016-08-02 MED ORDER — DEXAMETHASONE SODIUM PHOSPHATE 10 MG/ML IJ SOLN
INTRAMUSCULAR | Status: DC | PRN
  Administered 2016-08-02: 10 mg via INTRAVENOUS

## 2016-08-02 MED ORDER — MIDAZOLAM HCL 2 MG/2ML IJ SOLN
1.0000 mg | Freq: Once | INTRAMUSCULAR | Status: AC
  Administered 2016-08-02: 0.9 mg via INTRAVENOUS

## 2016-08-02 MED ORDER — FENTANYL CITRATE (PF) 100 MCG/2ML IJ SOLN
INTRAMUSCULAR | Status: AC
  Administered 2016-08-02: 40 ug via INTRAVENOUS
  Filled 2016-08-02: qty 2

## 2016-08-02 MED ORDER — BOOST / RESOURCE BREEZE PO LIQD
1.0000 | Freq: Three times a day (TID) | ORAL | Status: DC
Start: 2016-08-02 — End: 2016-08-03

## 2016-08-02 MED ORDER — GLYCOPYRROLATE 0.2 MG/ML IJ SOLN
INTRAMUSCULAR | Status: DC | PRN
  Administered 2016-08-02: 0.6 mg via INTRAVENOUS

## 2016-08-02 MED ORDER — 0.9 % SODIUM CHLORIDE (POUR BTL) OPTIME
TOPICAL | Status: DC | PRN
  Administered 2016-08-02 (×2): 1000 mL

## 2016-08-02 MED ORDER — ROCURONIUM BROMIDE 100 MG/10ML IV SOLN
INTRAVENOUS | Status: DC | PRN
  Administered 2016-08-02: 40 mg via INTRAVENOUS
  Administered 2016-08-02: 20 mg via INTRAVENOUS

## 2016-08-02 MED ORDER — MEPERIDINE HCL 25 MG/ML IJ SOLN: 6.2500 mg | INTRAMUSCULAR | Status: DC | PRN

## 2016-08-02 MED ORDER — FENTANYL CITRATE (PF) 100 MCG/2ML IJ SOLN
50.0000 ug | Freq: Once | INTRAMUSCULAR | Status: AC
  Administered 2016-08-02: 40 ug via INTRAVENOUS

## 2016-08-02 MED ORDER — SODIUM CHLORIDE 0.9 % IV SOLN
INTRAVENOUS | Status: DC
  Administered 2016-08-02: 16:00:00 via INTRAVENOUS

## 2016-08-02 MED ORDER — TRIAMTERENE-HCTZ 37.5-25 MG PO TABS
1.0000 | ORAL_TABLET | Freq: Every day | ORAL | Status: DC
  Administered 2016-08-03: 1 via ORAL
  Filled 2016-08-02 (×2): qty 1

## 2016-08-02 MED ORDER — FENTANYL CITRATE (PF) 100 MCG/2ML IJ SOLN
INTRAMUSCULAR | Status: AC
  Filled 2016-08-02: qty 4

## 2016-08-02 MED ORDER — PROPOFOL 10 MG/ML IV BOLUS
INTRAVENOUS | Status: DC | PRN
  Administered 2016-08-02: 110 mg via INTRAVENOUS

## 2016-08-02 MED ORDER — ONDANSETRON 4 MG PO TBDP: 4.0000 mg | ORAL_TABLET | Freq: Four times a day (QID) | ORAL | Status: DC | PRN

## 2016-08-02 MED ORDER — CLONAZEPAM 1 MG PO TABS
1.0000 mg | ORAL_TABLET | Freq: Two times a day (BID) | ORAL | Status: DC | PRN
  Administered 2016-08-03: 1 mg via ORAL
  Filled 2016-08-02: qty 1

## 2016-08-02 MED ORDER — LIDOCAINE HCL (CARDIAC) 20 MG/ML IV SOLN
INTRAVENOUS | Status: DC | PRN
  Administered 2016-08-02: 40 mg via INTRAVENOUS

## 2016-08-02 MED ORDER — OXYCODONE HCL 5 MG PO TABS
5.0000 mg | ORAL_TABLET | ORAL | Status: DC | PRN
  Administered 2016-08-02 – 2016-08-03 (×5): 10 mg via ORAL
  Filled 2016-08-02 (×5): qty 2

## 2016-08-02 SURGICAL SUPPLY — 47 items
APPLIER CLIP 9.375 MED OPEN (MISCELLANEOUS) ×3
BINDER BREAST LRG (GAUZE/BANDAGES/DRESSINGS) IMPLANT
BINDER BREAST XLRG (GAUZE/BANDAGES/DRESSINGS) IMPLANT
BIOPATCH RED 1 DISK 7.0 (GAUZE/BANDAGES/DRESSINGS) ×4 IMPLANT
BIOPATCH RED 1IN DISK 7.0MM (GAUZE/BANDAGES/DRESSINGS) ×2
CHLORAPREP W/TINT 26ML (MISCELLANEOUS) IMPLANT
CLIP APPLIE 9.375 MED OPEN (MISCELLANEOUS) ×1 IMPLANT
CLOSURE WOUND 1/2 X4 (GAUZE/BANDAGES/DRESSINGS) ×1
COVER SURGICAL LIGHT HANDLE (MISCELLANEOUS) ×3 IMPLANT
DERMABOND ADVANCED (GAUZE/BANDAGES/DRESSINGS) ×4
DERMABOND ADVANCED .7 DNX12 (GAUZE/BANDAGES/DRESSINGS) ×2 IMPLANT
DRAIN CHANNEL 19F RND (DRAIN) ×6 IMPLANT
DRAPE CHEST BREAST 15X10 FENES (DRAPES) ×3 IMPLANT
DRAPE PROXIMA HALF (DRAPES) ×3 IMPLANT
DRSG PAD ABDOMINAL 8X10 ST (GAUZE/BANDAGES/DRESSINGS) ×6 IMPLANT
DRSG TEGADERM 4X4.75 (GAUZE/BANDAGES/DRESSINGS) ×3 IMPLANT
ELECT BLADE 4.0 EZ CLEAN MEGAD (MISCELLANEOUS) ×3
ELECT CAUTERY BLADE 6.4 (BLADE) ×3 IMPLANT
ELECT REM PT RETURN 9FT ADLT (ELECTROSURGICAL) ×3
ELECTRODE BLDE 4.0 EZ CLN MEGD (MISCELLANEOUS) ×1 IMPLANT
ELECTRODE REM PT RTRN 9FT ADLT (ELECTROSURGICAL) ×1 IMPLANT
EVACUATOR SILICONE 100CC (DRAIN) ×6 IMPLANT
GLOVE BIO SURGEON STRL SZ 6 (GLOVE) ×3 IMPLANT
GLOVE BIO SURGEON STRL SZ7 (GLOVE) ×3 IMPLANT
GLOVE BIO SURGEON STRL SZ8 (GLOVE) ×3 IMPLANT
GLOVE BIOGEL PI IND STRL 7.5 (GLOVE) ×1 IMPLANT
GLOVE BIOGEL PI IND STRL 8.5 (GLOVE) ×1 IMPLANT
GLOVE BIOGEL PI INDICATOR 7.5 (GLOVE) ×2
GLOVE BIOGEL PI INDICATOR 8.5 (GLOVE) ×2
GLOVE SURG SS PI 7.0 STRL IVOR (GLOVE) ×3 IMPLANT
GOWN STRL REUS W/ TWL LRG LVL3 (GOWN DISPOSABLE) ×2 IMPLANT
GOWN STRL REUS W/TWL LRG LVL3 (GOWN DISPOSABLE) ×4
GOWN STRL REUS W/TWL XL LVL3 (GOWN DISPOSABLE) ×3 IMPLANT
KIT BASIN OR (CUSTOM PROCEDURE TRAY) ×3 IMPLANT
KIT ROOM TURNOVER OR (KITS) ×3 IMPLANT
NS IRRIG 1000ML POUR BTL (IV SOLUTION) ×6 IMPLANT
PACK GENERAL/GYN (CUSTOM PROCEDURE TRAY) ×3 IMPLANT
PAD ARMBOARD 7.5X6 YLW CONV (MISCELLANEOUS) ×3 IMPLANT
SPECIMEN JAR X LARGE (MISCELLANEOUS) ×3 IMPLANT
STAPLER VISISTAT 35W (STAPLE) ×3 IMPLANT
STRIP CLOSURE SKIN 1/2X4 (GAUZE/BANDAGES/DRESSINGS) ×2 IMPLANT
SUT ETHILON 2 0 FS 18 (SUTURE) ×6 IMPLANT
SUT MNCRL AB 4-0 PS2 18 (SUTURE) ×6 IMPLANT
SUT SILK 2 0 SH (SUTURE) ×3 IMPLANT
SUT VIC AB 3-0 SH 18 (SUTURE) ×3 IMPLANT
TOWEL OR 17X24 6PK STRL BLUE (TOWEL DISPOSABLE) ×3 IMPLANT
TOWEL OR 17X26 10 PK STRL BLUE (TOWEL DISPOSABLE) ×3 IMPLANT

## 2016-08-02 NOTE — Interval H&P Note (Signed)
History and Physical Interval Note:  08/02/2016 12:08 PM  Debra Griffith  has presented today for surgery, with the diagnosis of LEFT BREAST CANCER  The various methods of treatment have been discussed with the patient and family. After consideration of risks, benefits and other options for treatment, the patient has consented to  Procedure(s): LEFT TOTAL MASTECTOMY (Left) as a surgical intervention .  The patient's history has been reviewed, patient examined, no change in status, stable for surgery.  I have reviewed the patient's chart and labs.  Questions were answered to the patient's satisfaction.     [name]

## 2016-08-02 NOTE — Op Note (Signed)
Preoperative diagnosis: Left breast cancer status post lumpectomy and reduction with nonhealing wound and fat necrosis Postoperative diagnosis: Same as above Procedure: Left total mastectomy Surgeon: Dr. Serita Grammes Asst: Dr Irene Limbo Anesthesia: Gen. Estimated blood loss: 50 mL Complications: None Drains: 219 French Blake drains Sponge count was correct at completion Specimens: Left breast marked short stitch superior, long stitch lateral Disposition to recovery in stable condition  Indications: This is a 75 year old female with multiple comorbidities who underwent a left breast lumpectomy with negative margins and sentinel node biopsy for left breast cancer. She then underwent a reduction week later. She's had fat necrosis and a nonhealing wound. Discussed all of the options and elected proceed with a left total mastectomy at this point.  Procedure: After informed consent was obtained the patient was taken the operating room. She was given antibiotics. SCDs were in place. She was placed under general anesthesia without complication. Her left breast was prepped and draped in the standard sterile surgical fashion. Surgical timeout was then performed.  We elected to make a vertical incision encompassing the nonhealing wound. Flaps were then created in the parasternal area. This was thin over there in my prior lumpectomy cavity and I used scissors for this portion. The superior flap was taken near the clavicle. The remaining flaps were taken to the IM fold as well as the latissimus. The breast tissue was then removed along with the pectoralis fashion for the pectoralis muscle. This was done with some difficulty due to the scar tissue that was present. This was then marked and passed off the table. Then I obtained hemostasis. Irrigation was performed. I then placed 2 19 [place] Blake drains and secured these with 2-0 nylon suture. The incision was then approximated with staples. The bottom  portion the incision was then made into a T by excising some excess skin. The dermis was then closed with 3-0 Vicryl. The skin was closed with 4-0 Monocryl. Glue and Steri-Strips were placed. A dressing was placed. She tolerated this well was activated and transferred to the recovery room in stable condition.

## 2016-08-02 NOTE — Anesthesia Postprocedure Evaluation (Signed)
Anesthesia Post Note  Patient: Debra Griffith  Procedure(s) Performed: Procedure(s) (LRB): LEFT TOTAL MASTECTOMY (Left)  Patient location during evaluation: PACU Anesthesia Type: General and Regional Level of consciousness: awake and alert and oriented Pain management: pain level controlled Vital Signs Assessment: post-procedure vital signs reviewed and stable Respiratory status: spontaneous breathing, nonlabored ventilation, respiratory function stable and patient connected to nasal cannula oxygen Cardiovascular status: blood pressure returned to baseline and stable Postop Assessment: no signs of nausea or vomiting Anesthetic complications: no    Last Vitals:  Vitals:   08/02/16 1359 08/02/16 1414  BP: 111/70 (!) 127/54  Pulse: 61 62  Resp: (!) 51 17  Temp:      Last Pain:  Vitals:   08/02/16 1430  TempSrc:   PainSc: 5                  Ej Pinson A.

## 2016-08-02 NOTE — H&P (Signed)
Debra Griffith is an 75 y.o. female.   Chief Complaint: left breast cancer, nonhealing wound HPI:  15 yof s/p seed bracketed lumpectomy with sn biopsy. path is idc, grade I with two foci spanning 1.7 and 1.5 cm, this is focally 1 mm to anterior margin, one node negative and itcs in one node. this was er/pr pos, her 2 negative with low ki. oncotype is 18. has fat necrosis and loss of nac on left after reduction, difficult to get this to heal. Dr Iran Planas called me yesterday and thinks we should proceed to mastectomy   Past Medical History:  Diagnosis Date  . Anemia    hx of   . Anxiety   . Arthritis    OA  . Cancer [facility])    left breast CA   . Complication of anesthesia    severe diarrhea and vomiting after hysterectomy   . Complication of anesthesia    trouble waking up   . CRI (chronic renal insufficiency) 07/27/2016  . DDD (degenerative disc disease), lumbar   . Depression   . Dysrhythmia    OVER 10 YRS AGO - SAW CARDIOLOGIST FOR IRREGULAR HEART BEAT - NO TREATMENT NEEDED AND HAS NOT HAD TO SEE CARDIOLOGIST SINCE.  Marland Kitchen GERD (gastroesophageal reflux disease)   . H/O bronchitis    FLARE UPS USUALLY ONCE A YEAR  . Hypertension   . Inflammatory arthritis 07/27/2016   Sero Negative, Positive Synovitis hands, WJ  . Insomnia   . Macular degeneration of right eye   . Obesity   . Osteoarthritis of both feet 07/27/2016  . Osteoarthritis of both hands 07/27/2016  . PONV (postoperative nausea and vomiting)    SEVERE N&V AND DIARRHEA AFTER HYSTERECTOMY AND AFTER WISDOM TEETH EXTRACTIONS - NO PROBLEMS WITH LAST 2 SURGERIES - THE HIP AND LEFT KNEE  . RBBB (right bundle branch block) 07/27/2016  . Right bundle branch block   . S/p dental crown    dental crowns on every tooth  . Vitamin D deficiency     Past Surgical History:  Procedure Laterality Date  . ABDOMINAL HYSTERECTOMY    . AXILLARY SURGERY Left 06/25/2016   Aspiration of left axillary seroma   . BREAST LUMPECTOMY WITH  RADIOACTIVE SEED AND SENTINEL LYMPH NODE BIOPSY Left 06/13/2016   Procedure: LEFT BREAST LUMPECTOMY WITH BRACKETED  RADIOACTIVE SEED AND SENTINEL LYMPH NODE BIOPSY;  Surgeon: Rolm Bookbinder, MD;  Location: [place];  Service: General;  Laterality: Left;  . BREAST RECONSTRUCTION Bilateral 06/25/2016   BILATERAL ONCOPLASTIC BREAST RECONSTRUCTION WITH BREAST REDUCTION  . BREAST RECONSTRUCTION WITH PLACEMENT OF TISSUE EXPANDER AND FLEX HD (ACELLULAR HYDRATED DERMIS) Bilateral 06/25/2016   Procedure: BILATERAL ONCOPLASTIC BREAST RECONSTRUCTION WITH BREAST REDUCTION, Aspiration of left axillary seroma;  Surgeon: Irene Limbo, MD;  Location: [place];  Service: Plastics;  Laterality: Bilateral;  . BREAST REDUCTION SURGERY Bilateral 06/25/2016   Procedure: MAMMARY REDUCTION  (BREAST) BILATERAL;  Surgeon: Irene Limbo, MD;  Location: [place];  Service: Plastics;  Laterality: Bilateral;  . CESAREAN SECTION     x 3  . COLONOSCOPY    . DEBRIDEMENT AND CLOSURE WOUND Right 07/11/2016   Procedure: DEBRIDEMENT LEFT BREAST;  Surgeon: Irene Limbo, MD;  Location: [place];  Service: Plastics;  Laterality: Right;  . JOINT REPLACEMENT    . REDUCTION MAMMAPLASTY Bilateral 06/25/2016  . TOTAL HIP ARTHROPLASTY  06/24/2012   Procedure: TOTAL HIP ARTHROPLASTY;  Surgeon: [name], MD;  [facility];  Service: Orthopedics;  Laterality:  Left;  . TOTAL KNEE ARTHROPLASTY N/A 11/02/2013   Procedure: LEFT TOTAL KNEE ARTHROPLASTY WITH RIGHT KNEE CORTISONE INJECTION;  Surgeon: [name], MD;  [facility];  Service: Orthopedics;  Laterality: N/A;  . TOTAL KNEE ARTHROPLASTY Right 05/31/2014   Procedure: RIGHT TOTAL KNEE ARTHROPLASTY;  Surgeon: [name], MD;  [facility];  Service: Orthopedics;  Laterality: Right;  . WISDOM TOOTH EXTRACTION  1970's   admitted to hospital for surgery    History reviewed. No pertinent family history. Social History:  reports that she has never smoked. She has  never used smokeless tobacco. She reports that she does not drink alcohol or use drugs.  Allergies:  Allergies  Allergen Reactions  . Morphine And Related     hallucinations    Medications Prior to Admission  Medication Sig Dispense Refill  . amLODipine (NORVASC) 10 MG tablet Take 10 mg by mouth daily.     Marland Kitchen anastrozole (ARIMIDEX) 1 MG tablet Take 1 tablet (1 mg total) by mouth daily. 30 tablet 6  . buPROPion (WELLBUTRIN SR) 150 MG 12 hr tablet Take 150 mg by mouth every morning.    . cholecalciferol (VITAMIN D) 1000 units tablet Take 1,000 Units by mouth daily.    . clonazePAM (KLONOPIN) 1 MG tablet Take 1 mg by mouth 2 (two) times daily.     . Cyanocobalamin (B-12) 500 MCG TABS Take 500 mcg by mouth daily.    Marland Kitchen HYDROcodone-acetaminophen (NORCO) 10-325 MG tablet Take 1 tablet by mouth every 6 (six) hours as needed for moderate pain.    Marland Kitchen MAGNESIUM PO Take 2 tablets by mouth daily.    . metoprolol tartrate (LOPRESSOR) 25 MG tablet Take 25 mg by mouth 2 (two) times daily.     . Misc Natural Products (TART CHERRY ADVANCED PO) Take 1 capsule by mouth daily.    . Omega-3 Fatty Acids (FISH OIL) 1000 MG CAPS Take 1,000 mg by mouth daily.    Marland Kitchen oxyCODONE (OXY IR/ROXICODONE) 5 MG immediate release tablet Take 5 mg by mouth every 6 (six) hours as needed for severe pain.    Marland Kitchen triamterene-hydrochlorothiazide (MAXZIDE-25) 37.5-25 MG tablet Take 1 tablet by mouth daily.     . Turmeric 500 MG CAPS Take 500 mg by mouth daily.     . valsartan (DIOVAN) 320 MG tablet Take 320 mg by mouth daily.     . vitamin C (ASCORBIC ACID) 500 MG tablet Take 1,000 mg by mouth daily.    Marland Kitchen zolpidem (AMBIEN) 10 MG tablet Take 10 mg by mouth at bedtime.     Marland Kitchen aspirin EC 81 MG tablet Take 81 mg by mouth daily.       Results for orders placed or performed during the hospital encounter of 08/02/16 (from the past 48 hour(s))  Basic metabolic panel     Status: Abnormal   Collection Time: 08/02/16 10:35 AM  Result Value Ref  Range   Sodium 136 135 - 145 mmol/L   Potassium 3.2 (L) 3.5 - 5.1 mmol/L   Chloride 101 101 - 111 mmol/L   CO2 26 22 - 32 mmol/L   Glucose, Bld 127 (H) 65 - 99 mg/dL   BUN 15 6 - 20 mg/dL   Creatinine, Ser 1.52 (H) 0.44 - 1.00 mg/dL   Calcium 9.7 8.9 - 10.3 mg/dL   GFR calc non Af Amer 33 (L) >60 mL/min   GFR calc Af Amer 38 (L) >60 mL/min    Comment: (NOTE)  The eGFR has been calculated using the CKD EPI equation. This calculation has not been validated in all clinical situations. eGFR's persistently <60 mL/min signify possible Chronic Kidney Disease.    Anion gap 9 5 - 15  CBC     Status: Abnormal   Collection Time: 08/02/16 10:35 AM  Result Value Ref Range   WBC 9.7 4.0 - 10.5 K/uL   RBC 3.68 (L) 3.87 - 5.11 MIL/uL   Hemoglobin 10.1 (L) 12.0 - 15.0 g/dL   HCT 32.2 (L) 36.0 - 46.0 %   MCV 87.5 78.0 - 100.0 fL   MCH 27.4 26.0 - 34.0 pg   MCHC 31.4 30.0 - 36.0 g/dL   RDW 14.1 11.5 - 15.5 %   Platelets 328 150 - 400 K/uL   No results found.  ROS Left breast infection  Blood pressure (!) 120/46, pulse 63, temperature 98.1 F (36.7 C), temperature source Oral, resp. rate 13, weight 124.7 kg (275 lb), SpO2 99 %. Physical Exam  Vitals Bary Castilla Bradford CMA; 07/27/2016 10:24 AM) 07/27/2016 10:23 AM Weight: 270.4 lb Height: 61in Body Surface Area: 2.15 m Body Mass Index: 51.09 kg/m  Temp.: 97.27F  Pulse: 69 (Regular)  BP: 132/78 (Sitting, Left Arm, Standard) cv rrr pulm clear bilaterally Breast Note: left breast incision with complete dehiscence, fat necrosis.  Assessment/Plan POSTOPERATIVE STATE [phone]) Story: Left total mastectomy I think that she just needs mastectomy at this point due to issues after lumpectomy and reduction. this should avoid radiation and just make her treatment surgery and antiestrogens. I discussed risks of wound healing, infection, flap breakdown with her today. I think could use standard incision or possibly a vertical incision for  mastectomy also.   Rolm Bookbinder, MD 08/02/2016, 12:06 PM

## 2016-08-02 NOTE — Anesthesia Preprocedure Evaluation (Addendum)
Anesthesia Evaluation  Patient identified by MRN, date of birth, ID band Patient awake    Reviewed: Allergy & Precautions, NPO status , Patient's Chart, lab work & pertinent test results  History of Anesthesia Complications (+) PONV and history of anesthetic complications  Airway Mallampati: III  TM Distance: >3 FB Neck ROM: Limited  Mouth opening: Limited Mouth Opening  Dental  (+) Caps   Pulmonary neg pulmonary ROS,    Pulmonary exam normal breath sounds clear to auscultation       Cardiovascular hypertension, Pt. on medications Normal cardiovascular exam+ dysrhythmias  Rhythm:Regular Rate:Normal  RBBB   Neuro/Psych PSYCHIATRIC DISORDERS Anxiety Depression Macular degeneration OD    GI/Hepatic Neg liver ROS, GERD  Medicated and Controlled,  Endo/Other  Morbid obesity  Renal/GU Renal InsufficiencyRenal disease  negative genitourinary   Musculoskeletal  (+) Arthritis , Osteoarthritis,    Abdominal (+) + obese,   Peds  Hematology  (+) anemia ,   Anesthesia Other Findings Caps and Veneers on "all" her teeth  Reproductive/Obstetrics                            Anesthesia Physical Anesthesia Plan  ASA: III  Anesthesia Plan: General and Regional   Post-op Pain Management:  Regional for Post-op pain   Induction: Intravenous  Airway Management Planned: Oral ETT  Additional Equipment:   Intra-op Plan:   Post-operative Plan: Extubation in OR  Informed Consent: I have reviewed the patients History and Physical, chart, labs and discussed the procedure including the risks, benefits and alternatives for the proposed anesthesia with the patient or authorized representative who has indicated his/her understanding and acceptance.   Dental advisory given  Plan Discussed with: Anesthesiologist, CRNA and Surgeon  Anesthesia Plan Comments:         Anesthesia Quick Evaluation

## 2016-08-02 NOTE — Anesthesia Procedure Notes (Signed)
Anesthesia Regional Block:  Pectoralis block  Pre-Anesthetic Checklist: ,, timeout performed, Correct Patient, Correct Site, Correct Laterality, Correct Procedure, Correct Position, site marked, Risks and benefits discussed,  Surgical consent,  Pre-op evaluation,  At surgeon's request and post-op pain management  Laterality: Left  Prep: chloraprep       Needles:   Needle Type: Echogenic Needle     Needle Length: 9cm 9 cm Needle Gauge: 21 and 21 G    Additional Needles: Pectoralis block Narrative:  Start time: 08/02/2016 11:35 AM End time: 08/02/2016 11:44 AM Injection made incrementally with aspirations every 5 mL.  Performed by: Personally  Anesthesiologist: Suzette Battiest

## 2016-08-02 NOTE — Progress Notes (Signed)
Patient arrived from PACU, alert and oriented. Received report from PACU nurse. VSS, patient has mild pain at this time, IV fluids infusing. No complaints at this moment. Family notified she has arrived to unit.

## 2016-08-02 NOTE — Transfer of Care (Signed)
Immediate Anesthesia Transfer of Care Note  Patient: Debra Griffith  Procedure(s) Performed: Procedure(s): LEFT TOTAL MASTECTOMY (Left)  Patient Location: PACU  Anesthesia Type:GA combined with regional for post-op pain  Level of Consciousness: awake, alert , oriented and patient cooperative  Airway & Oxygen Therapy: Patient Spontanous Breathing and Patient connected to nasal cannula oxygen  Post-op Assessment: Report given to RN and Post -op Vital signs reviewed and stable  Post vital signs: Reviewed and stable  Last Vitals:  Vitals:   08/02/16 1344 08/02/16 1345  BP: 119/71   Pulse: (!) 59   Resp:    Temp:  36.6 C    Last Pain:  Vitals:   08/02/16 1345  TempSrc:   PainSc: 4          Complications: No apparent anesthesia complications

## 2016-08-03 ENCOUNTER — Encounter (HOSPITAL_COMMUNITY): Payer: Self-pay | Admitting: General Surgery

## 2016-08-03 ENCOUNTER — Ambulatory Visit: Payer: Medicare Other

## 2016-08-03 ENCOUNTER — Ambulatory Visit
Admission: RE | Admit: 2016-08-03 | Discharge: 2016-08-03 | Disposition: A | Discharge status: Home / Self Care | Payer: Medicare Other | Source: Ambulatory Visit | Attending: Radiation Oncology | Admitting: Radiation Oncology

## 2016-08-03 ENCOUNTER — Ambulatory Visit: Payer: Medicare Other | Admitting: Radiation Oncology

## 2016-08-03 DIAGNOSIS — M064 Inflammatory polyarthropathy: Secondary | ICD-10-CM | POA: Diagnosis not present

## 2016-08-03 DIAGNOSIS — I129 Hypertensive chronic kidney disease with stage 1 through stage 4 chronic kidney disease, or unspecified chronic kidney disease: Secondary | ICD-10-CM | POA: Diagnosis not present

## 2016-08-03 DIAGNOSIS — F419 Anxiety disorder, unspecified: Secondary | ICD-10-CM | POA: Diagnosis not present

## 2016-08-03 DIAGNOSIS — C50912 Malignant neoplasm of unspecified site of left female breast: Secondary | ICD-10-CM | POA: Diagnosis not present

## 2016-08-03 DIAGNOSIS — M5136 Other intervertebral disc degeneration, lumbar region: Secondary | ICD-10-CM | POA: Diagnosis not present

## 2016-08-03 DIAGNOSIS — N189 Chronic kidney disease, unspecified: Secondary | ICD-10-CM | POA: Diagnosis not present

## 2016-08-03 LAB — CBC
HEMATOCRIT: 28.4 % — AB (ref 36.0–46.0)
HEMOGLOBIN: 9.1 g/dL — AB (ref 12.0–15.0)
MCH: 27.7 pg (ref 26.0–34.0)
MCHC: 32 g/dL (ref 30.0–36.0)
MCV: 86.6 fL (ref 78.0–100.0)
Platelets: 326 10*3/uL (ref 150–400)
RBC: 3.28 MIL/uL — AB (ref 3.87–5.11)
RDW: 14.3 % (ref 11.5–15.5)
WBC: 16.2 10*3/uL — AB (ref 4.0–10.5)

## 2016-08-03 LAB — BASIC METABOLIC PANEL
ANION GAP: 9 (ref 5–15)
BUN: 15 mg/dL (ref 6–20)
CHLORIDE: 100 mmol/L — AB (ref 101–111)
CO2: 25 mmol/L (ref 22–32)
Calcium: 9.4 mg/dL (ref 8.9–10.3)
Creatinine, Ser: 1.38 mg/dL — ABNORMAL HIGH (ref 0.44–1.00)
GFR calc Af Amer: 42 mL/min — ABNORMAL LOW (ref >60)
GFR calc non Af Amer: 37 mL/min — ABNORMAL LOW (ref >60)
Glucose, Bld: 145 mg/dL — ABNORMAL HIGH (ref 65–99)
POTASSIUM: 4 mmol/L (ref 3.5–5.1)
SODIUM: 134 mmol/L — AB (ref 135–145)

## 2016-08-03 MED ORDER — OXYCODONE HCL 5 MG PO TABS: 5.0000 mg | ORAL_TABLET | ORAL | 0 refills | Status: DC | PRN

## 2016-08-03 NOTE — Discharge Instructions (Signed)
[facility] [place] °[phone] ° °MASTECTOMY: POST OP INSTRUCTIONS ° °Always review your discharge instruction sheet given to you by the facility where your surgery was performed. °IF YOU HAVE DISABILITY OR FAMILY LEAVE FORMS, YOU MUST BRING THEM TO THE OFFICE FOR PROCESSING.   °DO NOT GIVE THEM TO YOUR DOCTOR. °A prescription for pain medication may be given to you upon discharge.  Take your pain medication as prescribed, if needed.  If narcotic pain medicine is not needed, then you may take acetaminophen (Tylenol), naprosyn (Alleve) or ibuprofen (Advil) as needed. °1. Take your usually prescribed medications unless otherwise directed. °2. If you need a refill on your pain medication, please contact your pharmacy.  They will contact our office to request authorization.  Prescriptions will not be filled after 5pm or on week-ends. °3. You should follow a light diet the first few days after arrival home, such as soup and crackers, etc.  Resume your normal diet the day after surgery. °4. Most patients will experience some swelling and bruising on the chest and underarm.  Ice packs will help.  Swelling and bruising can take several days to resolve. Wear the binder day and night until you return to the office.  °5. It is common to experience some constipation if taking pain medication after surgery.  Increasing fluid intake and taking a stool softener (such as Colace) will usually help or prevent this problem from occurring.  A mild laxative (Milk of Magnesia or Miralax) should be taken according to package instructions if there are no bowel movements after 48 hours. °6. Unless discharge instructions indicate otherwise, leave your bandage dry and in place until your next appointment in 3-5 days.  You may take a limited sponge bath.  No tube baths or showers until the drains are removed.  You may have steri-strips (small skin tapes) in place directly over the incision.  These strips should be left on the  skin for 7-10 days. If you have glue it will come off in next couple week.  Any sutures will be removed at an office visit °7. DRAINS:  If you have drains in place, it is important to keep a list of the amount of drainage produced each day in your drains.  Before leaving the hospital, you should be instructed on drain care.  Call our office if you have any questions about your drains. I will remove your drains when they put out less than 30 cc or ml for 2 consecutive days. °8. ACTIVITIES:  You may resume regular (light) daily activities beginning the next day--such as daily self-care, walking, climbing stairs--gradually increasing activities as tolerated.  You may have sexual intercourse when it is comfortable.  Refrain from any heavy lifting or straining until approved by your doctor. °a. You may drive when you are no longer taking prescription pain medication, you can comfortably wear a seatbelt, and you can safely maneuver your car and apply brakes. °b. RETURN TO WORK:  __________________________________________________________ °9. You should see your doctor in the office for a follow-up appointment approximately 3-5 days after your surgery.  Your doctor’s nurse will typically make your follow-up appointment when she calls you with your pathology report.  Expect your pathology report 3-4business days after surgery. °10. OTHER INSTRUCTIONS: ______________________________________________________________________________________________ ____________________________________________________________________________________________ °WHEN TO CALL YOUR DR Shiro Ellerman: °1. Fever over 101.0 °2. Nausea and/or vomiting °3. Extreme swelling or bruising °4. Continued bleeding from incision. °5. Increased pain, redness, or drainage from the incision. °The clinic staff is available   to answer your questions during regular business hours.  Please don’t hesitate to call and ask to speak to one of the nurses for clinical concerns.  If  you have a medical emergency, go to the nearest emergency room or call 911.  A surgeon from [facility] is always on call at the hospital. °[address], [address], [place], [place]  [address] ? [address] [address], [place], [place]   [address] °[phone] ? [phone] ? FAX [phone] °Web site: [email or web address] ° °

## 2016-08-03 NOTE — Discharge Summary (Signed)
Physician Discharge Summary  Patient ID: Debra Griffith MRN: [id number] DOB/AGE: Aug 16, 1941 75 y.o.  Admit date: 08/02/2016 Discharge date: 08/03/2016  Admission Diagnoses: Open wound left breast, left breast cancer, s/p reduction mammaplasty  Discharge Diagnoses:  Active Problems:   Cancer of left female breast  [facility])   Discharged Condition: stable  Hospital Course: Patient did well post operatively with controlled pain, ambulated with walker and able to tolerate diet.  Treatments: surgery: left mastectomy  Discharge Exam: Blood pressure 129/73, pulse 77, temperature 98.3 F (36.8 C), temperature source Oral, resp. rate 18, weight 124.7 kg (275 lb), SpO2 95 %. Incision/Wound: flat, dray, drains serosanguinous  Disposition: 01-Home or Self Care  Discharge Instructions    Call MD for:  redness, tenderness, or signs of infection (pain, swelling, bleeding, redness, odor or green/yellow discharge around incision site)    Complete by:  As directed    Call MD for:  temperature >100.5    Complete by:  As directed    Discharge instructions    Complete by:  As directed    Ok to remove dressings and shower am 11.18.19. Soap and water ok, pat incisions dry. No creams or ointments over incisions. Do not let drains dangle in shower, attach to lanyard or similar.Strip and record drains twice daily and bring log to clinic visit.  Breast binder or soft compression bra all other times.  Ok to raise arms above shoulders for bathing and dressing.  No house yard work or exercise until cleared by MD.   Driving Restrictions    Complete by:  As directed    No driving if taking narcotics   Lifting restrictions    Complete by:  As directed    No lifting greater than 5 lbs for at least 2 weeks   Resume previous diet    Complete by:  As directed        Medication List    STOP taking these medications   HYDROcodone-acetaminophen 10-325 MG tablet Commonly known as:  [place] these  medications   amLODipine 10 MG tablet Commonly known as:  NORVASC Take 10 mg by mouth daily.   anastrozole 1 MG tablet Commonly known as:  ARIMIDEX Take 1 tablet (1 mg total) by mouth daily.   aspirin EC 81 MG tablet Take 81 mg by mouth daily.   B-12 500 MCG Tabs Take 500 mcg by mouth daily.   buPROPion 150 MG 12 hr tablet Commonly known as:  WELLBUTRIN SR Take 150 mg by mouth every morning.   cholecalciferol 1000 units tablet Commonly known as:  VITAMIN D Take 1,000 Units by mouth daily.   clonazePAM 1 MG tablet Commonly known as:  KLONOPIN Take 1 mg by mouth 2 (two) times daily.   Fish Oil 1000 MG Caps Take 1,000 mg by mouth daily.   MAGNESIUM PO Take 2 tablets by mouth daily.   metoprolol tartrate 25 MG tablet Commonly known as:  LOPRESSOR Take 25 mg by mouth 2 (two) times daily.   oxyCODONE 5 MG immediate release tablet Commonly known as:  Oxy IR/ROXICODONE Take 1-2 tablets (5-10 mg total) by mouth every 4 (four) hours as needed for severe pain. What changed:  how much to take  when to take this   TART CHERRY ADVANCED PO Take 1 capsule by mouth daily.   triamterene-hydrochlorothiazide 37.5-25 MG tablet Commonly known as:  MAXZIDE-25 Take 1 tablet by mouth daily.   Turmeric 500 MG Caps Take 500  mg by mouth daily.   valsartan 320 MG tablet Commonly known as:  DIOVAN Take 320 mg by mouth daily.   vitamin C 500 MG tablet Commonly known as:  ASCORBIC ACID Take 1,000 mg by mouth daily.   zolpidem 10 MG tablet Commonly known as:  AMBIEN Take 10 mg by mouth at bedtime.      Follow-up Information    WAKEFIELD,MATTHEW, MD Follow up in 1 week(s).   Specialty:  General Surgery Contact information: [address] [place] [address] [phone]           Signed: Irene Limbo 08/03/2016, 12:32 PM

## 2016-08-03 NOTE — Care Management Obs Status (Signed)
[place] NOTIFICATION   Patient Details  Name: Debra Griffith MRN: [id number] Date of Birth: 02/25/1941   Medicare Observation Status Notification Given:  Yes (Medicare obs procedure )    Marilu Favre, RN 08/03/2016, 8:57 AM

## 2016-08-03 NOTE — Progress Notes (Signed)
Patient's BP is 103/55, holding amlodipine and metoprolol. Patient's IV got pulled on accident, she states does not want another.

## 2016-08-03 NOTE — Progress Notes (Signed)
1 Day Post-Op  Subjective: Doing fine, oob to bathroom, pain controlled  Objective: Vital signs in last 24 hours: Temp:  [97.4 F (36.3 C)-98.3 F (36.8 C)] 98.3 F (36.8 C) (11/17 0439) Pulse Rate:  [58-83] 77 (11/17 0439) Resp:  [11-51] 18 (11/17 0439) BP: (96-171)/(29-124) 129/73 (11/17 0439) SpO2:  [95 %-100 %] 95 % (11/17 0439) Weight:  [124.7 kg (275 lb)] 124.7 kg (275 lb) (11/16 1006) Last BM Date: 08/01/16  Intake/Output from previous day: 11/16 0701 - 11/17 0700 In: 2707 [P.O.:1200; I.V.:1357; IV Piggyback:150] Out: 1220 [Urine:900; Drains:220; Blood:100] Intake/Output this shift: No intake/output data recorded.  pulm clear bilaterally cv rrr Drains with expected output, flaps viable  Lab Results:   Recent Labs  08/02/16 1035 08/03/16 0547  WBC 9.7 16.2*  HGB 10.1* 9.1*  HCT 32.2* 28.4*  PLT 328 326   BMET  Recent Labs  08/02/16 1035 08/03/16 0547  NA 136 134*  K 3.2* 4.0  CL 101 100*  CO2 26 25  GLUCOSE 127* 145*  BUN 15 15  CREATININE 1.52* 1.38*  CALCIUM 9.7 9.4   PT/INR No results for input(s): LABPROT, INR in the last 72 hours. ABG No results for input(s): PHART, HCO3 in the last 72 hours.  Invalid input(s): PCO2, PO2  Studies/Results: No results found.  Anti-infectives: Anti-infectives    Start     Dose/Rate Route Frequency Ordered Stop   08/02/16 2000  ceFAZolin (ANCEF) IVPB 2g/100 mL premix     2 g 200 mL/hr over 30 Minutes Intravenous Every 8 hours 08/02/16 1451 08/03/16 0647   08/01/16 0845  ceFAZolin (ANCEF) 3 g in dextrose 5 % 50 mL IVPB  Status:  Discontinued     3 g 130 mL/hr over 30 Minutes Intravenous To Short Stay 08/01/16 0836 08/02/16 1451      Assessment/Plan: POD 1 left total mastectomy  PT consult Possibly home later today or tomorrow Path pending Continue drains  [facility] 08/03/2016

## 2016-08-03 NOTE — Progress Notes (Signed)
Debra Griffith to be D/C'd  per MD order. Discussed with the patient and all questions fully answered.  VSS, Skin clean, dry and intact without evidence of skin break down, no evidence of skin tears noted.  IV catheter discontinued intact. Site without signs and symptoms of complications. Dressing and pressure applied.  An After Visit Summary was printed and given to the patient. Patient received prescription.  D/c education completed with patient/family including follow up instructions, medication list, d/c activities limitations if indicated, with other d/c instructions as indicated by MD - patient able to verbalize understanding, all questions fully answered.   Patient instructed to return to ED, call 911, or call MD for any changes in condition.   Patient to be escorted via [place], and D/C home via private auto.

## 2016-08-07 ENCOUNTER — Ambulatory Visit: Payer: Medicare Other | Admitting: Physical Therapy

## 2016-08-12 ENCOUNTER — Telehealth: Payer: Self-pay | Admitting: General Surgery

## 2016-08-12 NOTE — Telephone Encounter (Signed)
Pt called in w/ c/o bad odor from L breast surgical site. Has had problems with ongoing infection requiring multiple surgeries. No f/c/n/v. States drain output has changed a little bit. Some grayish tinge at times. 1 drain working, 1 not. Also says there is some redness on skin but that it doesn't blanch. Main c/o is odor. Pt states she has appt with Dr Iran Planas tomorrow morning at 8am. Discussed 2 options with pt. Start oral abx and f/u with plastics in am as planned or go to [facility] ED for evaluation. Pt chose to start abx. Advised pt to call back with questions.

## 2016-08-16 ENCOUNTER — Ambulatory Visit: Payer: Medicare Other | Admitting: Physical Therapy

## 2016-08-23 DIAGNOSIS — G894 Chronic pain syndrome: Secondary | ICD-10-CM | POA: Diagnosis not present

## 2016-08-23 DIAGNOSIS — M47816 Spondylosis without myelopathy or radiculopathy, lumbar region: Secondary | ICD-10-CM | POA: Diagnosis not present

## 2016-08-23 DIAGNOSIS — M545 Low back pain: Secondary | ICD-10-CM | POA: Diagnosis not present

## 2016-08-23 DIAGNOSIS — M4807 Spinal stenosis, lumbosacral region: Secondary | ICD-10-CM | POA: Diagnosis not present

## 2016-08-29 ENCOUNTER — Other Ambulatory Visit: Payer: Self-pay | Admitting: General Surgery

## 2016-08-29 ENCOUNTER — Inpatient Hospital Stay (HOSPITAL_COMMUNITY)
Admission: AD | Admit: 2016-08-29 | Discharge: 2016-09-02 | DRG: 863 | Disposition: A | Discharge status: Home Health | Payer: Medicare Other | Source: Ambulatory Visit | Attending: General Surgery | Admitting: General Surgery

## 2016-08-29 ENCOUNTER — Observation Stay (HOSPITAL_COMMUNITY): Payer: Medicare Other

## 2016-08-29 ENCOUNTER — Encounter (HOSPITAL_COMMUNITY): Payer: Self-pay | Admitting: General Practice

## 2016-08-29 DIAGNOSIS — R410 Disorientation, unspecified: Secondary | ICD-10-CM | POA: Diagnosis not present

## 2016-08-29 DIAGNOSIS — F29 Unspecified psychosis not due to a substance or known physiological condition: Secondary | ICD-10-CM | POA: Diagnosis present

## 2016-08-29 DIAGNOSIS — E876 Hypokalemia: Secondary | ICD-10-CM | POA: Diagnosis present

## 2016-08-29 DIAGNOSIS — F05 Delirium due to known physiological condition: Secondary | ICD-10-CM | POA: Diagnosis not present

## 2016-08-29 DIAGNOSIS — T814XXA Infection following a procedure, initial encounter: Principal | ICD-10-CM | POA: Diagnosis present

## 2016-08-29 DIAGNOSIS — N61 Mastitis without abscess: Secondary | ICD-10-CM | POA: Diagnosis not present

## 2016-08-29 DIAGNOSIS — M069 Rheumatoid arthritis, unspecified: Secondary | ICD-10-CM | POA: Diagnosis present

## 2016-08-29 DIAGNOSIS — H3530 Unspecified macular degeneration: Secondary | ICD-10-CM | POA: Diagnosis present

## 2016-08-29 DIAGNOSIS — I959 Hypotension, unspecified: Secondary | ICD-10-CM | POA: Diagnosis present

## 2016-08-29 DIAGNOSIS — Z853 Personal history of malignant neoplasm of breast: Secondary | ICD-10-CM

## 2016-08-29 DIAGNOSIS — F329 Major depressive disorder, single episode, unspecified: Secondary | ICD-10-CM | POA: Diagnosis present

## 2016-08-29 DIAGNOSIS — L03313 Cellulitis of chest wall: Secondary | ICD-10-CM

## 2016-08-29 DIAGNOSIS — I481 Persistent atrial fibrillation: Secondary | ICD-10-CM | POA: Diagnosis present

## 2016-08-29 DIAGNOSIS — E860 Dehydration: Secondary | ICD-10-CM

## 2016-08-29 DIAGNOSIS — E669 Obesity, unspecified: Secondary | ICD-10-CM | POA: Diagnosis present

## 2016-08-29 DIAGNOSIS — E871 Hypo-osmolality and hyponatremia: Secondary | ICD-10-CM | POA: Diagnosis present

## 2016-08-29 DIAGNOSIS — Z6841 Body Mass Index (BMI) 40.0 and over, adult: Secondary | ICD-10-CM

## 2016-08-29 DIAGNOSIS — D649 Anemia, unspecified: Secondary | ICD-10-CM | POA: Diagnosis present

## 2016-08-29 DIAGNOSIS — L7634 Postprocedural seroma of skin and subcutaneous tissue following other procedure: Secondary | ICD-10-CM | POA: Diagnosis present

## 2016-08-29 DIAGNOSIS — N179 Acute kidney failure, unspecified: Secondary | ICD-10-CM | POA: Diagnosis present

## 2016-08-29 DIAGNOSIS — I129 Hypertensive chronic kidney disease with stage 1 through stage 4 chronic kidney disease, or unspecified chronic kidney disease: Secondary | ICD-10-CM | POA: Diagnosis present

## 2016-08-29 DIAGNOSIS — I4510 Unspecified right bundle-branch block: Secondary | ICD-10-CM | POA: Diagnosis present

## 2016-08-29 DIAGNOSIS — I10 Essential (primary) hypertension: Secondary | ICD-10-CM

## 2016-08-29 DIAGNOSIS — S0990XA Unspecified injury of head, initial encounter: Secondary | ICD-10-CM | POA: Diagnosis not present

## 2016-08-29 DIAGNOSIS — L0390 Cellulitis, unspecified: Secondary | ICD-10-CM | POA: Diagnosis present

## 2016-08-29 DIAGNOSIS — F0390 Unspecified dementia without behavioral disturbance: Secondary | ICD-10-CM | POA: Diagnosis present

## 2016-08-29 DIAGNOSIS — Z96651 Presence of right artificial knee joint: Secondary | ICD-10-CM | POA: Diagnosis present

## 2016-08-29 DIAGNOSIS — Z885 Allergy status to narcotic agent status: Secondary | ICD-10-CM

## 2016-08-29 DIAGNOSIS — I4891 Unspecified atrial fibrillation: Secondary | ICD-10-CM

## 2016-08-29 DIAGNOSIS — Y838 Other surgical procedures as the cause of abnormal reaction of the patient, or of later complication, without mention of misadventure at the time of the procedure: Secondary | ICD-10-CM | POA: Diagnosis present

## 2016-08-29 DIAGNOSIS — F419 Anxiety disorder, unspecified: Secondary | ICD-10-CM | POA: Diagnosis present

## 2016-08-29 DIAGNOSIS — R531 Weakness: Secondary | ICD-10-CM | POA: Diagnosis present

## 2016-08-29 DIAGNOSIS — N183 Chronic kidney disease, stage 3 (moderate): Secondary | ICD-10-CM | POA: Diagnosis present

## 2016-08-29 DIAGNOSIS — K219 Gastro-esophageal reflux disease without esophagitis: Secondary | ICD-10-CM | POA: Diagnosis present

## 2016-08-29 DIAGNOSIS — M5136 Other intervertebral disc degeneration, lumbar region: Secondary | ICD-10-CM | POA: Diagnosis present

## 2016-08-29 DIAGNOSIS — E559 Vitamin D deficiency, unspecified: Secondary | ICD-10-CM | POA: Diagnosis present

## 2016-08-29 HISTORY — DX: History of falling: Z91.81

## 2016-08-29 LAB — CBC
HEMATOCRIT: 29.2 % — AB (ref 36.0–46.0)
Hemoglobin: 9.6 g/dL — ABNORMAL LOW (ref 12.0–15.0)
MCH: 27 pg (ref 26.0–34.0)
MCHC: 32.9 g/dL (ref 30.0–36.0)
MCV: 82 fL (ref 78.0–100.0)
PLATELETS: 464 10*3/uL — AB (ref 150–400)
RBC: 3.56 MIL/uL — ABNORMAL LOW (ref 3.87–5.11)
RDW: 15 % (ref 11.5–15.5)
WBC: 22.7 10*3/uL — AB (ref 4.0–10.5)

## 2016-08-29 LAB — COMPREHENSIVE METABOLIC PANEL
ALT: 22 U/L (ref 14–54)
ANION GAP: 13 (ref 5–15)
AST: 29 U/L (ref 15–41)
Albumin: 2.3 g/dL — ABNORMAL LOW (ref 3.5–5.0)
Alkaline Phosphatase: 71 U/L (ref 38–126)
BILIRUBIN TOTAL: 0.4 mg/dL (ref 0.3–1.2)
BUN: 35 mg/dL — AB (ref 6–20)
CHLORIDE: 94 mmol/L — AB (ref 101–111)
CO2: 22 mmol/L (ref 22–32)
Calcium: 9.5 mg/dL (ref 8.9–10.3)
Creatinine, Ser: 1.68 mg/dL — ABNORMAL HIGH (ref 0.44–1.00)
GFR, EST AFRICAN AMERICAN: 33 mL/min — AB (ref >60)
GFR, EST NON AFRICAN AMERICAN: 29 mL/min — AB (ref >60)
Glucose, Bld: 130 mg/dL — ABNORMAL HIGH (ref 65–99)
POTASSIUM: 3 mmol/L — AB (ref 3.5–5.1)
Sodium: 129 mmol/L — ABNORMAL LOW (ref 135–145)
TOTAL PROTEIN: 5.7 g/dL — AB (ref 6.5–8.1)

## 2016-08-29 LAB — URINALYSIS, ROUTINE W REFLEX MICROSCOPIC
BILIRUBIN URINE: NEGATIVE
GLUCOSE, UA: NEGATIVE mg/dL
HGB URINE DIPSTICK: NEGATIVE
Ketones, ur: NEGATIVE mg/dL
NITRITE: NEGATIVE
PROTEIN: NEGATIVE mg/dL
SPECIFIC GRAVITY, URINE: 1.014 (ref 1.005–1.030)
pH: 5 (ref 5.0–8.0)

## 2016-08-29 MED ORDER — METRONIDAZOLE 500 MG PO TABS
500.0000 mg | ORAL_TABLET | Freq: Three times a day (TID) | ORAL | Status: DC
  Administered 2016-08-29 – 2016-09-02 (×11): 500 mg via ORAL
  Filled 2016-08-29 (×11): qty 1

## 2016-08-29 MED ORDER — ACETAMINOPHEN 325 MG PO TABS
650.0000 mg | ORAL_TABLET | Freq: Four times a day (QID) | ORAL | Status: DC | PRN
  Administered 2016-09-02: 650 mg via ORAL
  Filled 2016-08-29: qty 2

## 2016-08-29 MED ORDER — ACETAMINOPHEN 325 MG PO TABS: 650.0000 mg | ORAL_TABLET | Freq: Four times a day (QID) | ORAL | Status: DC | PRN

## 2016-08-29 MED ORDER — SODIUM CHLORIDE 0.9 % IV SOLN
INTRAVENOUS | Status: DC
  Administered 2016-08-29: 18:00:00 via INTRAVENOUS

## 2016-08-29 MED ORDER — OXYCODONE HCL 5 MG PO TABS
5.0000 mg | ORAL_TABLET | Freq: Four times a day (QID) | ORAL | Status: DC | PRN
  Administered 2016-08-30 – 2016-09-02 (×8): 5 mg via ORAL
  Filled 2016-08-29 (×8): qty 1

## 2016-08-29 MED ORDER — TRIAMTERENE-HCTZ 37.5-25 MG PO TABS: 1.0000 | ORAL_TABLET | Freq: Every day | ORAL | Status: DC

## 2016-08-29 MED ORDER — BUPROPION HCL ER (SR) 150 MG PO TB12
150.0000 mg | ORAL_TABLET | Freq: Every morning | ORAL | Status: DC
  Administered 2016-08-30 – 2016-09-02 (×4): 150 mg via ORAL
  Filled 2016-08-29 (×4): qty 1

## 2016-08-29 MED ORDER — ACETAMINOPHEN 650 MG RE SUPP: 650.0000 mg | Freq: Four times a day (QID) | RECTAL | Status: DC | PRN

## 2016-08-29 MED ORDER — METHOCARBAMOL 500 MG PO TABS
500.0000 mg | ORAL_TABLET | Freq: Four times a day (QID) | ORAL | Status: DC | PRN
  Administered 2016-08-30 – 2016-09-02 (×8): 500 mg via ORAL
  Filled 2016-08-29 (×8): qty 1

## 2016-08-29 MED ORDER — DEXTROSE 5 % IV SOLN: 2.0000 g | Freq: Three times a day (TID) | INTRAVENOUS | Status: DC

## 2016-08-29 MED ORDER — OXYCODONE HCL 5 MG PO TABS
5.0000 mg | ORAL_TABLET | ORAL | Status: DC | PRN
  Administered 2016-08-29: 10 mg via ORAL
  Filled 2016-08-29: qty 2

## 2016-08-29 MED ORDER — ONDANSETRON 4 MG PO TBDP: 4.0000 mg | ORAL_TABLET | Freq: Four times a day (QID) | ORAL | Status: DC | PRN

## 2016-08-29 MED ORDER — SIMETHICONE 80 MG PO CHEW: 40.0000 mg | CHEWABLE_TABLET | Freq: Four times a day (QID) | ORAL | Status: DC | PRN

## 2016-08-29 MED ORDER — DAKINS (1/4 STRENGTH) 0.125 % EX SOLN
Freq: Two times a day (BID) | CUTANEOUS | Status: AC
  Administered 2016-08-29 – 2016-09-01 (×6)
  Filled 2016-08-29: qty 473

## 2016-08-29 MED ORDER — BOOST / RESOURCE BREEZE PO LIQD
1.0000 | Freq: Three times a day (TID) | ORAL | Status: DC
  Administered 2016-08-30: 1 via ORAL

## 2016-08-29 MED ORDER — SODIUM CHLORIDE 0.9 % IV SOLN: INTRAVENOUS | Status: AC

## 2016-08-29 MED ORDER — METHOCARBAMOL 500 MG PO TABS: 500.0000 mg | ORAL_TABLET | Freq: Four times a day (QID) | ORAL | Status: AC | PRN

## 2016-08-29 MED ORDER — ONDANSETRON HCL 4 MG/2ML IJ SOLN: 4.0000 mg | Freq: Four times a day (QID) | INTRAMUSCULAR | Status: DC | PRN

## 2016-08-29 MED ORDER — OXYCODONE HCL 5 MG PO TABS: 5.0000 mg | ORAL_TABLET | ORAL | Status: AC | PRN

## 2016-08-29 MED ORDER — AMLODIPINE BESYLATE 10 MG PO TABS: 10.0000 mg | ORAL_TABLET | Freq: Every day | ORAL | Status: DC

## 2016-08-29 MED ORDER — METOPROLOL TARTRATE 12.5 MG HALF TABLET
12.5000 mg | ORAL_TABLET | Freq: Two times a day (BID) | ORAL | Status: DC
  Administered 2016-08-30: 12.5 mg via ORAL
  Filled 2016-08-29: qty 1

## 2016-08-29 MED ORDER — CLONAZEPAM 1 MG PO TABS
1.0000 mg | ORAL_TABLET | Freq: Once | ORAL | Status: AC
  Administered 2016-08-29: 1 mg via ORAL
  Filled 2016-08-29: qty 1

## 2016-08-29 MED ORDER — SODIUM CHLORIDE 0.9 % IV SOLN: 4.0000 mg | Freq: Four times a day (QID) | INTRAVENOUS | Status: AC | PRN

## 2016-08-29 MED ORDER — SIMETHICONE 80 MG PO CHEW: 40.0000 mg | CHEWABLE_TABLET | Freq: Four times a day (QID) | ORAL | Status: AC | PRN

## 2016-08-29 MED ORDER — METOPROLOL TARTRATE 25 MG PO TABS: 25.0000 mg | ORAL_TABLET | Freq: Two times a day (BID) | ORAL | Status: DC

## 2016-08-29 MED ORDER — SODIUM CHLORIDE 0.9 % IV SOLN: INTRAVENOUS | Status: DC

## 2016-08-29 MED ORDER — CEFAZOLIN SODIUM-DEXTROSE 2-4 GM/100ML-% IV SOLN
2.0000 g | Freq: Three times a day (TID) | INTRAVENOUS | Status: DC
  Administered 2016-08-29 – 2016-09-02 (×11): 2 g via INTRAVENOUS
  Filled 2016-08-29 (×13): qty 100

## 2016-08-29 MED ORDER — METRONIDAZOLE 250 MG PO TABS: 500.0000 mg | ORAL_TABLET | Freq: Three times a day (TID) | ORAL | Status: AC

## 2016-08-29 MED ORDER — ONDANSETRON 4 MG PO TBDP: 4.0000 mg | ORAL_TABLET | Freq: Four times a day (QID) | ORAL | Status: AC | PRN

## 2016-08-29 MED ORDER — ENOXAPARIN SODIUM 150 MG/ML ~~LOC~~ SOLN: 40.0000 mg | SUBCUTANEOUS | Status: DC

## 2016-08-29 MED ORDER — ENOXAPARIN SODIUM 40 MG/0.4ML ~~LOC~~ SOLN
40.0000 mg | SUBCUTANEOUS | Status: DC
  Administered 2016-08-29 – 2016-08-30 (×2): 40 mg via SUBCUTANEOUS
  Filled 2016-08-29 (×2): qty 0.4

## 2016-08-29 NOTE — Progress Notes (Signed)
Pt admitted to 6N30 as a direct admit.  Pt AAO X4.  Pt on RA.  Pt has wound to left breast with serosanguineous drainage and gauze dressing.  Family to bedside.  Doctor to be notified pt is on floor.  Will continue to monitor.

## 2016-08-29 NOTE — Consult Note (Signed)
Medical Consultation   Debra Griffith  [id number]  DOB: 1941/02/04  DOA: 08/29/2016  PCP: Henrine Screws, MD Outpatient Specialists:  Oncology: Dr. Jana Hakim   Requesting physician: Dr. Rolm Bookbinder, Gen. surgery  Reason for consultation: Assistance with evaluation and management of falls, weakness and confusion.   History of Present Illness: Debra Griffith is an 75 y.o. female with PMH of left breast cancer status post lumpectomy followed by reduction which was complicated by necrosis leading to eventual mastectomy that left her with an open wound that is being followed by surgery in the office, HTN, rheumatoid arthritis, morbid obesity, macular degeneration, anxiety & depression, presented to general surgeons office with complaints of increased drainage from left breast site wound, generalized weakness, decreased appetite, falls and confusion. She underwent I&D of the left breast site wound in the office and was admitted to [facility] for further evaluation and management. TRH was consulted. Patient and caregiver at bedside provided history. Patient is somewhat confused and has difficulty providing history. She apparently was in her usual state of health until 48 hours ago (this was corroborated by Gaffer who last saw her in the office on Friday of last week and she was in her usual state of health) when she started noticing decreased appetite, generalized weakness, dizziness and lightheaded at times and sustained 4 falls over the next 2 days without any injuries or loss of consciousness. She denies nausea, vomiting, abdominal pain, fever or chills. She was constipated and took extra dose of laxatives and had self-limiting loose stools. She denies headache, earache, sore throat, cough, dyspnea or chest pain. She is unable to provide details of the falls. She says she might have bumped into some furniture at home leading to fall. She denies any strokelike  symptoms. No history of dysuria, urinary frequency or decreased urine output. She does volunteer to feeling thirsty and dry mouth. As per caregiver, she has been intermittently confused since Monday.    Review of Systems:  ROS All other systems reviewed and apart from HPI, all others are negative.  Past Medical History: Past Medical History:  Diagnosis Date  . Anemia    hx of   . Anxiety   . Breast cancer in female [facility]) 03/2016   left  . Chronic bronchitis ([place])    FLARE UPS USUALLY ONCE A YEAR  . Chronic lower back pain   . Complication of anesthesia    trouble waking up   . CRI (chronic renal insufficiency) 07/27/2016  . DDD (degenerative disc disease), lumbar   . Depression   . Dysrhythmia    OVER 10 YRS AGO - SAW CARDIOLOGIST FOR IRREGULAR HEART BEAT - NO TREATMENT NEEDED AND HAS NOT HAD TO SEE CARDIOLOGIST SINCE.  [place] Kitchen GERD (gastroesophageal reflux disease)   . History of recent fall    "twice on Sunday; once yesterday" (08/29/2016)  . Hypertension   . Inflammatory arthritis 07/27/2016   Sero Negative, Positive Synovitis hands, WJ  . Insomnia   . Irregular heart beats   . Macular degeneration of right eye   . Obesity   . Osteoarthritis of both feet 07/27/2016  . Osteoarthritis of both hands 07/27/2016  . PONV (postoperative nausea and vomiting)    SEVERE N&V AND DIARRHEA AFTER HYSTERECTOMY AND AFTER WISDOM TEETH EXTRACTIONS - NO PROBLEMS WITH LAST 2 SURGERIES - THE HIP AND LEFT KNEE  . RBBB (right bundle branch block) 07/27/2016  .  Rheumatoid arthritis [facility])    "doctor recently took me off all RX for this" (08/02/2016)  . Right bundle branch block   . Right bundle branch block   . S/p dental crown    dental crowns on every tooth  . Vitamin D deficiency     Past Surgical History: Past Surgical History:  Procedure Laterality Date  . ABDOMINAL HYSTERECTOMY    . AXILLARY SURGERY Left 06/25/2016   Aspiration of left axillary seroma   . BREAST BIOPSY Left 03/2016    . BREAST LUMPECTOMY WITH RADIOACTIVE SEED AND SENTINEL LYMPH NODE BIOPSY Left 06/13/2016   Procedure: LEFT BREAST LUMPECTOMY WITH BRACKETED  RADIOACTIVE SEED AND SENTINEL LYMPH NODE BIOPSY;  Surgeon: Rolm Bookbinder, MD;  Location: [place];  Service: General;  Laterality: Left;  . BREAST RECONSTRUCTION Bilateral 06/25/2016   BILATERAL ONCOPLASTIC BREAST RECONSTRUCTION WITH BREAST REDUCTION  . BREAST RECONSTRUCTION WITH PLACEMENT OF TISSUE EXPANDER AND FLEX HD (ACELLULAR HYDRATED DERMIS) Bilateral 06/25/2016   Procedure: BILATERAL ONCOPLASTIC BREAST RECONSTRUCTION WITH BREAST REDUCTION, Aspiration of left axillary seroma;  Surgeon: Irene Limbo, MD;  Location: [place];  Service: Plastics;  Laterality: Bilateral;  . BREAST REDUCTION SURGERY Bilateral 06/25/2016   Procedure: MAMMARY REDUCTION  (BREAST) BILATERAL;  Surgeon: Irene Limbo, MD;  Location: [place];  Service: Plastics;  Laterality: Bilateral;  . CESAREAN SECTION  1966; 1971; 1973  . COLONOSCOPY    . DEBRIDEMENT AND CLOSURE WOUND Right 07/11/2016   Procedure: DEBRIDEMENT LEFT BREAST;  Surgeon: Irene Limbo, MD;  Location: [place];  Service: Plastics;  Laterality: Right;  . JOINT REPLACEMENT    . KNEE ARTHROSCOPY Right    "before replacement"  . MASTECTOMY COMPLETE / SIMPLE Left 08/02/2016   total  . REDUCTION MAMMAPLASTY Bilateral 06/25/2016  . TOTAL HIP ARTHROPLASTY  06/24/2012   Procedure: TOTAL HIP ARTHROPLASTY;  Surgeon: [name], MD;  Location: [facility];  Service: Orthopedics;  Laterality: Left;  . TOTAL KNEE ARTHROPLASTY N/A 11/02/2013   Procedure: LEFT TOTAL KNEE ARTHROPLASTY WITH RIGHT KNEE CORTISONE INJECTION;  Surgeon: [name], MD;  Location: [facility];  Service: Orthopedics;  Laterality: N/A;  . TOTAL KNEE ARTHROPLASTY Right 05/31/2014   Procedure: RIGHT TOTAL KNEE ARTHROPLASTY;  Surgeon: [name], MD;  Location: [facility];  Service: Orthopedics;  Laterality: Right;  . TOTAL MASTECTOMY Left 08/02/2016    Procedure: LEFT TOTAL MASTECTOMY;  Surgeon: Rolm Bookbinder, MD;  Location: [place];  Service: General;  Laterality: Left;  . TUBAL LIGATION  1973  . WISDOM TOOTH EXTRACTION  1970's   admitted to hospital for surgery     Allergies:   Allergies  Allergen Reactions  . Morphine And Related     hallucinations     Social History:  reports that she has never smoked. She has never used smokeless tobacco. She reports that she does not drink alcohol or use drugs.   Family History: History reviewed. No pertinent family history.    Physical Exam: Vitals:   08/29/16 1635  BP: (!) 93/43  Pulse: (!) 104  Resp: 16  Temp: 97.7 F (36.5 C)  TempSrc: Oral  SpO2: 99%    Constitutional: Moderately built and morbidly obese pleasant elderly female lying comfortably propped up in bed. Patient's female caregiver and RN are at bedside. Eyes: PERLA, EOMI, irises appear normal, anicteric sclera. Bilateral immature cataracts. ENMT: external ears and nose appear normal, hearing normal, Lips appears normal, oropharynx mucosa, tongue, posterior pharynx appear normal . Dry oral mucosa.  Neck:  neck appears normal, no masses, normal ROM, no thyromegaly, no JVD  CVS: S1-S2 clear, RRR-mildly tachycardic, no murmur rubs or gallops, no LE edema, normal pedal pulses  Respiratory:  clear to auscultation bilaterally, no wheezing, rales or rhonchi. Respiratory effort normal. No accessory muscle use.  Abdomen: soft nontender, nondistended but obese, normal bowel sounds, no hepatosplenomegaly, no hernias  Musculoskeletal: : no cyanosis, clubbing or edema noted bilaterally. Upper extremity power symmetrical graded 5 x 5 power. Lower extremity symmetrical at least grade 4 x 5 power. Neuro: Alert and oriented 3. No focal neurological deficits. Psych: judgement and insight appear impaired, stable mood and affect,  Skin: Left breast I&D site dressing clean and dry.   Data reviewed:  I have personally reviewed  following labs and imaging studies Labs:  No labs are available during the time of this dictation. I have requested CBC, CMP and CT head without contrast stat. Urine microscopy is pending. These are to be reviewed when available.      Inpatient Medications:   Scheduled Meds: . [START ON 08/30/2016] buPROPion  150 mg Oral q morning - 10a  .  ceFAZolin (ANCEF) IV  2 g Intravenous Q8H  . enoxaparin (LOVENOX) injection  40 mg Subcutaneous Q24H  . feeding supplement  1 Container Oral TID BM  . [START ON 08/30/2016] metoprolol tartrate  12.5 mg Oral BID  . metroNIDAZOLE  500 mg Oral Q8H  . sodium hypochlorite   Irrigation BID   Continuous Infusions: . sodium chloride       Radiological Exams on Admission: No results found.  Impression/Recommendations Active Problems:   Essential hypertension   Cellulitis of left breast   Dehydration   Hypotension   Acute confusional state  1. Cellulitis/abscess of left mastectomy site wound: Status post I&D in office by general surgery on 08/29/16 and admitted to [facility] for IV antibiotics. Continue empirically started IV Ancef and oral Flagyl. Plastic surgery input appreciated. Nothing by mouth after midnight for possible OR in a.m. 2. Hypotension: Likely secondary to dehydration and antihypertensives. IV fluid hydration. Hold antihypertensives for today and resume one at a time from tomorrow at reduced doses and titrate as blood pressure allows. Clinically not septic. 3. Dehydration: Secondary to poor oral intake and diuretics. IV fluids. 4. Confusion: May be multifactorial secondary to dehydration, hypotension, infection, pain medications, rule out acute kidney injury. Treat underlying cause as above. Minimize narcotic pain medications. Obtain CT head without contrast. No syncope reported. 5. Mechanical falls at home: May be related to acute medical illness listed above. Treat as above. PT evaluation. 6. Essential hypertension: Management as per  problem number. 7. Stage III chronic kidney disease: Baseline creatinine may be in the 1.3 range. Follow BMP. Concern for acute kidney injury related to dehydration, hypotension and medications. 8. Anemia: Last hemoglobin 9.1 on 08/03/16. Follow CBCs.  9. Anxiety and depression: Consider resuming home medications pending medication reconciliation by pharmacy. 10. Left breast cancer status post mastectomy: Arimidex on hold due to acute infection. Outpatient follow-up with oncology. Supposed to start radiation treatment when surgical issues are resolved.  Discussed in detail with patient's caregiver at bedside. She has known patient for several months and is with the patient on all days except Fridays.  Thank you for this consultation.  We will follow the patient along with you.   Time Spent: 74 minutes  Krist Rosenboom, MD, FACP, FHM. [facility] Pager [phone]  If 7PM-7AM, please contact night-coverage [email or web address] Password [email or web address] 08/29/2016, 6:51 PM

## 2016-08-29 NOTE — H&P (Signed)
   75 yof with multiple medical issues who underwent lumpectomy followed by reduction for breast cancer. she had necrosis after reduction leading to mastectomy. she has open wound that has been followed. over weekend after i saw her she has been weak, has some falls from sitting (remembers all of them and has not hit her hear). she is taking liquids but not really solids. no appetite. no fevers, draining alot laterally from wound.    Past Surgical History Rolm Bookbinder, MD; 08/29/2016 3:04 PM) Hysterectomy (not due to cancer) - Complete Oral Surgery Breast Biopsy Left. Cesarean Section - Multiple  Allergies (Sonya Bynum, CMA; 08/29/2016 2:04 PM) Morphine Sulfate (PF) *ANALGESICS - OPIOID*  Medication History (Sonya Bynum, CMA; 08/29/2016 2:04 PM) OxyCODONE HCl (5MG  Tablet, Oral) Active. ClonazePAM (1MG  Tablet, Oral) Active. Hydrocodone-Acetaminophen (10-325MG  Tablet, Oral) Active. Lyrica (75MG  Capsule, Oral) Active. Triamterene-HCTZ (37.5-25MG  Tablet, Oral) Active. BuPROPion HCl ER (SR) (150MG  Tablet ER 12HR, Oral) Active. Valsartan (320MG  Tablet, Oral) Active. AmLODIPine Besylate (10MG  Tablet, Oral) Active. Metoprolol Tartrate (25MG  Tablet, Oral) Active. Hydroxychloroquine Sulfate (200MG  Tablet, Oral) Active. Zolpidem Tartrate (10MG  Tablet, Oral) Active. Cyclobenzaprine HCl (10MG  Tablet, Oral) Active. Baby Aspirin (81MG  Tablet Chewable, Oral) Active. Fish Oil (1000MG  Capsule, Oral) Active. B12-Active (1MG  Tablet Chewable, Oral) Active. Medications Reconciled  Social History Rolm Bookbinder, MD; 08/29/2016 3:04 PM) No drug use Tobacco use Never smoker. Caffeine use Coffee, Tea. No alcohol use  Family History Rolm Bookbinder, MD; 08/29/2016 3:05 PM) Arthritis Father, Mother, Sister. Heart Disease Father. Heart disease in female family member before age 16 Hypertension Father, Mother, Sister.    Vitals (Sonya Bynum CMA; 08/29/2016  2:04 PM) 08/29/2016 2:03 PM Weight: 265 lb Height: 61in Body Surface Area: 2.13 m Body Mass Index: 50.07 kg/m  Temp.: 19F(Temporal)  Pulse: 77 (Regular)  BP: 128/82 (Sitting, Left Arm, Standard)      Physical Exam Rolm Bookbinder MD; 08/29/2016 3:02 PM)  General Mental Status-Drowsy. Orientation-Oriented X3.  Chest and Lung Exam Chest and lung exam reveals -on auscultation, normal breath sounds, no adventitious sounds and normal vocal resonance.  Breast Note: left mastectomy incision with open area granulating, lateral to this is erythema and small hole inferiorly that is draining purulence, this appears adequately drained and I packed it with gauze to keep open  Cardiovascular Cardiovascular examination reveals -normal heart sounds, regular rate and rhythm with no murmurs.  Neuropsychiatric Note: neuro intact, no focal deficits    Assessment & Plan Rolm Bookbinder MD; 08/29/2016 3:07 PM)  POSTOPERATIVE STATE [phone]) Story: she has infection laterally, I think adequately drained now. I packed this. needs admission with iv abx, reevaluation of wound, there is possibility of surgery for this. I will ask medical team to see her also. I think falls related to weakness and no further eval needed as there appears to be reason.

## 2016-08-29 NOTE — Progress Notes (Signed)
   Plastic Surgery   Patient well known to me admitted for new fluid collection left chest and cellulitis. Has had multiple fall at home over last 5 days. History significant for oncoplastic breast reduction for left breast cancer. Course complicated by NAC necrosis and eventual mastectomy.   Developed new onset drainage lateral to known wound today and then had packing by Dr. Donne Hazel.   PE Weak appearing Left chest with cellulitis lateral chest T junction with open area granulated clean Lateral IMF scar with 1 cm opening with drainage- able to probe to 10 cm depth.   A/P Repacked wound, start Dakins while here. Ancef/Flagyl Being seeing by hospitalist currently.  NPO p MN for possible OR but appears to have drained seroma spontaneously Daughter expresses concern regarding both parents mental health and coping.   Irene Limbo, MD [facility] Plastic & Reconstructive Surgery [phone], pin [phone]

## 2016-08-30 ENCOUNTER — Other Ambulatory Visit: Payer: Self-pay | Admitting: Oncology

## 2016-08-30 ENCOUNTER — Observation Stay (HOSPITAL_COMMUNITY): Payer: Medicare Other

## 2016-08-30 ENCOUNTER — Encounter (HOSPITAL_COMMUNITY): Payer: Self-pay | Admitting: Student

## 2016-08-30 DIAGNOSIS — N179 Acute kidney failure, unspecified: Secondary | ICD-10-CM | POA: Diagnosis not present

## 2016-08-30 DIAGNOSIS — T814XXA Infection following a procedure, initial encounter: Secondary | ICD-10-CM | POA: Diagnosis not present

## 2016-08-30 DIAGNOSIS — R410 Disorientation, unspecified: Secondary | ICD-10-CM | POA: Diagnosis not present

## 2016-08-30 DIAGNOSIS — I481 Persistent atrial fibrillation: Secondary | ICD-10-CM | POA: Diagnosis not present

## 2016-08-30 DIAGNOSIS — M069 Rheumatoid arthritis, unspecified: Secondary | ICD-10-CM | POA: Diagnosis present

## 2016-08-30 DIAGNOSIS — I10 Essential (primary) hypertension: Secondary | ICD-10-CM | POA: Diagnosis not present

## 2016-08-30 DIAGNOSIS — L03313 Cellulitis of chest wall: Secondary | ICD-10-CM | POA: Diagnosis not present

## 2016-08-30 DIAGNOSIS — Y838 Other surgical procedures as the cause of abnormal reaction of the patient, or of later complication, without mention of misadventure at the time of the procedure: Secondary | ICD-10-CM | POA: Diagnosis present

## 2016-08-30 DIAGNOSIS — N61 Mastitis without abscess: Secondary | ICD-10-CM | POA: Diagnosis not present

## 2016-08-30 DIAGNOSIS — Z96651 Presence of right artificial knee joint: Secondary | ICD-10-CM | POA: Diagnosis present

## 2016-08-30 DIAGNOSIS — I480 Paroxysmal atrial fibrillation: Secondary | ICD-10-CM | POA: Diagnosis not present

## 2016-08-30 DIAGNOSIS — E876 Hypokalemia: Secondary | ICD-10-CM | POA: Diagnosis present

## 2016-08-30 DIAGNOSIS — L0390 Cellulitis, unspecified: Secondary | ICD-10-CM | POA: Diagnosis present

## 2016-08-30 DIAGNOSIS — F329 Major depressive disorder, single episode, unspecified: Secondary | ICD-10-CM | POA: Diagnosis present

## 2016-08-30 DIAGNOSIS — I959 Hypotension, unspecified: Secondary | ICD-10-CM | POA: Diagnosis not present

## 2016-08-30 DIAGNOSIS — F419 Anxiety disorder, unspecified: Secondary | ICD-10-CM | POA: Diagnosis present

## 2016-08-30 DIAGNOSIS — D649 Anemia, unspecified: Secondary | ICD-10-CM | POA: Diagnosis not present

## 2016-08-30 DIAGNOSIS — R531 Weakness: Secondary | ICD-10-CM | POA: Diagnosis present

## 2016-08-30 DIAGNOSIS — I129 Hypertensive chronic kidney disease with stage 1 through stage 4 chronic kidney disease, or unspecified chronic kidney disease: Secondary | ICD-10-CM | POA: Diagnosis present

## 2016-08-30 DIAGNOSIS — K219 Gastro-esophageal reflux disease without esophagitis: Secondary | ICD-10-CM | POA: Diagnosis present

## 2016-08-30 DIAGNOSIS — I4510 Unspecified right bundle-branch block: Secondary | ICD-10-CM | POA: Diagnosis present

## 2016-08-30 DIAGNOSIS — H3530 Unspecified macular degeneration: Secondary | ICD-10-CM | POA: Diagnosis present

## 2016-08-30 DIAGNOSIS — N183 Chronic kidney disease, stage 3 (moderate): Secondary | ICD-10-CM | POA: Diagnosis present

## 2016-08-30 DIAGNOSIS — F05 Delirium due to known physiological condition: Secondary | ICD-10-CM | POA: Diagnosis not present

## 2016-08-30 DIAGNOSIS — E871 Hypo-osmolality and hyponatremia: Secondary | ICD-10-CM | POA: Diagnosis not present

## 2016-08-30 DIAGNOSIS — Z6841 Body Mass Index (BMI) 40.0 and over, adult: Secondary | ICD-10-CM | POA: Diagnosis not present

## 2016-08-30 DIAGNOSIS — L7634 Postprocedural seroma of skin and subcutaneous tissue following other procedure: Secondary | ICD-10-CM | POA: Diagnosis not present

## 2016-08-30 DIAGNOSIS — E860 Dehydration: Secondary | ICD-10-CM | POA: Diagnosis not present

## 2016-08-30 LAB — TSH: TSH: 2.062 u[IU]/mL (ref 0.350–4.500)

## 2016-08-30 LAB — CBC
HCT: 29 % — ABNORMAL LOW (ref 36.0–46.0)
HEMOGLOBIN: 9.6 g/dL — AB (ref 12.0–15.0)
MCH: 27.2 pg (ref 26.0–34.0)
MCHC: 33.1 g/dL (ref 30.0–36.0)
MCV: 82.2 fL (ref 78.0–100.0)
Platelets: 502 10*3/uL — ABNORMAL HIGH (ref 150–400)
RBC: 3.53 MIL/uL — ABNORMAL LOW (ref 3.87–5.11)
RDW: 15.1 % (ref 11.5–15.5)
WBC: 22.6 10*3/uL — AB (ref 4.0–10.5)

## 2016-08-30 LAB — MAGNESIUM: Magnesium: 1.9 mg/dL (ref 1.7–2.4)

## 2016-08-30 LAB — BASIC METABOLIC PANEL
ANION GAP: 10 (ref 5–15)
BUN: 34 mg/dL — AB (ref 6–20)
CHLORIDE: 94 mmol/L — AB (ref 101–111)
CO2: 25 mmol/L (ref 22–32)
CREATININE: 1.61 mg/dL — AB (ref 0.44–1.00)
Calcium: 9.3 mg/dL (ref 8.9–10.3)
GFR calc non Af Amer: 30 mL/min — ABNORMAL LOW (ref >60)
GFR, EST AFRICAN AMERICAN: 35 mL/min — AB (ref >60)
GLUCOSE: 150 mg/dL — AB (ref 65–99)
POTASSIUM: 2.8 mmol/L — AB (ref 3.5–5.1)
SODIUM: 129 mmol/L — AB (ref 135–145)

## 2016-08-30 LAB — ECHOCARDIOGRAM COMPLETE

## 2016-08-30 MED ORDER — SODIUM CHLORIDE 0.9 % IV SOLN
INTRAVENOUS | Status: AC
  Administered 2016-08-30 – 2016-08-31 (×2): via INTRAVENOUS
  Filled 2016-08-30 (×3): qty 1000

## 2016-08-30 MED ORDER — ENSURE ENLIVE PO LIQD
237.0000 mL | Freq: Two times a day (BID) | ORAL | Status: DC
  Administered 2016-08-31 – 2016-09-02 (×4): 237 mL via ORAL

## 2016-08-30 MED ORDER — METOPROLOL TARTRATE 25 MG PO TABS
25.0000 mg | ORAL_TABLET | Freq: Two times a day (BID) | ORAL | Status: DC
  Administered 2016-08-30 – 2016-09-02 (×6): 25 mg via ORAL
  Filled 2016-08-30 (×6): qty 1

## 2016-08-30 MED ORDER — SODIUM CHLORIDE 0.9 % IV SOLN
30.0000 meq | Freq: Once | INTRAVENOUS | Status: AC
  Administered 2016-08-30: 30 meq via INTRAVENOUS
  Filled 2016-08-30: qty 15

## 2016-08-30 MED ORDER — WHITE PETROLATUM GEL
Status: AC
  Administered 2016-08-30: 11:00:00
  Filled 2016-08-30: qty 1

## 2016-08-30 MED ORDER — SODIUM CHLORIDE 0.9 % IV SOLN: INTRAVENOUS | Status: DC

## 2016-08-30 MED ORDER — METOPROLOL TARTRATE 5 MG/5ML IV SOLN: 2.5000 mg | INTRAVENOUS | Status: DC | PRN

## 2016-08-30 MED ORDER — ADULT MULTIVITAMIN W/MINERALS CH
1.0000 | ORAL_TABLET | Freq: Every day | ORAL | Status: DC
  Administered 2016-08-30 – 2016-09-02 (×4): 1 via ORAL
  Filled 2016-08-30 (×4): qty 1

## 2016-08-30 MED ORDER — WHITE PETROLATUM GEL
Status: AC
  Administered 2016-08-30: 19:00:00
  Filled 2016-08-30: qty 1

## 2016-08-30 NOTE — Progress Notes (Signed)
   Plastic Surgery   HD# 2 left chest seroma s/p spontaneous drainage, open wound.  CT head completed overnight. In midst of PT.   History significant for oncoplastic breast reduction for left breast cancer. Course complicated by NAC necrosis and eventual mastectomy.   Temp:  [97.6 F (36.4 C)-98.2 F (36.8 C)] 98.2 F (36.8 C) (12/14 0955) Pulse Rate:  [101-129] 105 (12/14 0959) Resp:  [16-18] 18 (12/14 0449) BP: (93-121)/(42-61) 106/52 (12/14 0959) SpO2:  [97 %-100 %] 99 % (12/14 0959)   Currently NPO.  PE More alert Left chest with imrpoved cellulitis lateral chest T junction with open area granulated clean Lateral IMF scar with 1 cm opening with less drainage- able to probe to 10 cm dept; there is additional pinpoint hole mid lateral chest that is present today and communicated with wound.    A/P  No plans for surgery today, improved. Dakins current- discussed now that fluid collection drained may consider stop packing of the lateral wound upon discharge.  Reviewed CT with no evidence stroke but has been dehydrated, poor PO. Nutrition supplements ordered.   Irene Limbo, MD [facility] Plastic & Reconstructive Surgery [phone], pin [phone]

## 2016-08-30 NOTE — Progress Notes (Signed)
Initial Nutrition Assessment  DOCUMENTATION CODES:   Morbid obesity  INTERVENTION:   -D/c Boost Breeze po TID, each supplement provides 250 kcal and 9 grams of protein -Ensure Enlive po BID, each supplement provides 350 kcal and 20 grams of protein -MVI daily  NUTRITION DIAGNOSIS:   Increased nutrient needs related to wound healing as evidenced by estimated needs.  GOAL:   Patient will meet greater than or equal to 90% of their needs  MONITOR:   PO intake, Supplement acceptance, Labs, Weight trends, Skin, I & O's  REASON FOR ASSESSMENT:   Malnutrition Screening Tool    ASSESSMENT:   Debra Griffith is an 75 y.o. female with PMH of left breast cancer status post lumpectomy followed by reduction which was complicated by necrosis leading to eventual mastectomy that left her with an open wound that is being followed by surgery in the office, HTN, rheumatoid arthritis, morbid obesity, macular degeneration, anxiety & depression, presented to general surgeons office with complaints of increased drainage from left breast site wound, generalized weakness, decreased appetite, falls and confusion.   Pt admitted with lt mastectomy infection.   Pt working with physical therapy at time of visit. Unable to perform nutrition focused physical exam or obtain further nutrition hx at this time.   Per H&P, pt has experienced poor appetite and weight loss PTA. Reviewed care everywhere records, which revealed that pt has experienced a 8.3% wt loss over the past 3 months, which is significant for time frame.   Pt with increased nutritional needs related to wound infection. Pt was just advanced to a regular diet and no intake records currently available. While Boost Gwyneth Revels is already ordered, RD will order Ensure due to increased caloric density.   Labs reviewed.   Diet Order:  Diet regular Room service appropriate? Yes; Fluid consistency: Thin  Skin:  Wound (see comment) (open lt breast  incision)  Last BM:  PTA  Height:   Ht Readings from Last 1 Encounters:  08/30/16 5' (1.524 m)    Weight:   Wt Readings from Last 1 Encounters:  08/30/16 265 lb (120.2 kg)    Ideal Body Weight:  45.5 kg  BMI:  Body mass index is 51.75 kg/m.  Estimated Nutritional Needs:   Kcal:  1600-1800  Protein:  90-105 grams  Fluid:  1.6-1.8 L  EDUCATION NEEDS:   No education needs identified at this time  Debra Griffith A. Jimmye Norman, RD, LDN, CDE Pager: [phone] After hours Pager: [phone]

## 2016-08-30 NOTE — Progress Notes (Signed)
PROGRESS NOTE  Debra Griffith  [id number] DOB: 1941/04/07  DOA: 08/29/2016 PCP: Henrine Screws, MD  Outpatient Specialists:  Oncology: Dr. Jana Hakim  Brief Narrative:  75 y.o. female with PMH of left breast cancer status post lumpectomy followed by reduction which was complicated by necrosis leading to eventual mastectomy that left her with an open wound that is being followed by surgery in the office, HTN, rheumatoid arthritis, morbid obesity, macular degeneration, anxiety & depression, presented to general surgeons office with complaints of increased drainage from left breast site wound, generalized weakness, decreased appetite, falls and confusion. She underwent I&D of the left breast site wound in the office and was admitted to [facility] for further evaluation and management. She was noted to have dehydration, hypotension, acute kidney injury. On 12/14, noted new onset A. fib.   Assessment & Plan:   Active Problems:   Essential hypertension   Cellulitis of left breast   Dehydration   Hypotension   Acute confusional state   1. Cellulitis/abscess of left mastectomy site wound: Status post I&D in office by general surgery on 08/29/16 and admitted to [facility] for IV antibiotics. Continue empirically started IV Ancef and oral Flagyl. Plastic surgery input appreciated. Patient is currently NPO and awaiting surgical follow-up regarding decision for surgery. 2. Hypotension: Likely secondary to dehydration and antihypertensives. IV fluid hydration. Held antihypertensives on day of admission. Stopped triamterene-HCTZ, amlodipine and valsartan. Resumed metoprolol at reduced dose . Clinically not septic. Improved. Continue IV fluids for additional 24 hours. 3. Dehydration with hyponatremia: Secondary to poor oral intake and diuretics. IV fluids. Improved. Continue IV fluids for additional 24 hours. Hyponatremia may be related to her dehydration and HCTZ. Consider stopping HCTZ indefinitely in this  elderly female. 4. Confusion: May be multifactorial secondary to dehydration, hypotension, infection, pain medications, acute kidney injury. Treat underlying cause as above. Minimize narcotic pain medications. No syncope reported. CT head without acute findings. Seems better. 5. Mechanical falls at home: May be related to acute medical illness listed above. Treat as above. PT evaluation. 6. Essential hypertension: Management as per problem number 2. 7. Acute on Stage III chronic kidney disease: Baseline creatinine may be in the 1.3 range. Presented with creatinine of 1.68. Holding diuretics, ARB, continue gentle IV fluids. Follow BMP in a.m.  8. Anemia: Last hemoglobin 9.1 on 08/03/16. Stable. 9. Anxiety and depression: Consider resuming home medications pending medication reconciliation by pharmacy. 10. Left breast cancer status post mastectomy: Arimidex on hold due to acute infection. Outpatient follow-up with oncology. Supposed to start radiation treatment when surgical issues are resolved. Noncontrast CT head 08/29/16 reports tiny calvarial lucencies which are likely benign though given patient's history of cancer metastasis not excluded-outpatient follow-up with oncology.  11. A. fib, new diagnosis/RBBB: Patient asymptomatic. Intermittent mild RVR in the 100s-110s. Increase metoprolol back to home dose as blood pressure tolerates. Check TSH and 2-D echo. CHA2DS2-VASc Score: 3. Anticoagulation needs to be considered. Consulted cardiology. 12. Leukocytosis: Likely secondary to problem #1. Follow CBCs. 13. Hypokalemia: Replace and follow. Check magnesium.   DVT prophylaxis: Lovenox Code Status: Full Family Communication: Discussed in detail with patient's daughter Suanne Marker at bedside on 08/30/16.  Disposition Plan: To be determined pending medical improvement and PT evaluation.    Consultants:   [facility] are consultants.   Cardiology  Procedures:   None  Antimicrobials:   IV Unasyn  12/13>  PO Flagyl 12/13>    Subjective: Seen this morning. Daughter at bedside. States that she urinated frequently last night. Denied  dysuria. Appears more coherent this morning. Denies palpitations, chest pain, dyspnea or pain elsewhere. Not happy with nursing care on the floor-discussed with nursing leadership.   Objective:  Vitals:   08/30/16 0449 08/30/16 0955 08/30/16 0958 08/30/16 0959  BP: (!) 110/51 (!) 116/55 121/61 (!) 106/52  Pulse: (!) 102 (!) 124 (!) 129 (!) 105  Resp: 18     Temp: 97.8 F (36.6 C) 98.2 F (36.8 C)    TempSrc: Oral Oral    SpO2: 99% 100% 100% 99%    Intake/Output Summary (Last 24 hours) at 08/30/16 1252 Last data filed at 08/30/16 1100  Gross per 24 hour  Intake           1732.5 ml  Output              825 ml  Net            907.5 ml   There were no vitals filed for this visit.  Examination:  General exam: Moderately built and obese female patient lying comfortably supine in bed.  Respiratory system: Clear to auscultation. Respiratory effort normal. Cardiovascular system: S1 & S2 heard, irregularly irregular. No JVD, murmurs, rubs, gallops or clicks. No pedal edema. Gastrointestinal system: Abdomen is nondistended, soft and nontender. No organomegaly or masses felt. Normal bowel sounds heard. Central nervous system: Alert and oriented to person, place and partly to time . No focal neurological deficits. Extremities: Symmetric 5 x 5 power. Skin: dressing over left upper anterior lateral chest wall at I&D site, clean and dry.  Psychiatry: Judgement and insight appear normal. Mood & affect appropriate.     Data Reviewed: I have personally reviewed following labs and imaging studies  CBC:  Recent Labs Lab 08/29/16 1837 08/30/16 0155  WBC 22.7* 22.6*  HGB 9.6* 9.6*  HCT 29.2* 29.0*  MCV 82.0 82.2  PLT 464* [id number]*   Basic Metabolic Panel:  Recent Labs Lab 08/29/16 1837 08/30/16 0155  NA 129* 129*  K 3.0* 2.8*  CL 94* 94*  CO2  22 25  GLUCOSE 130* 150*  BUN 35* 34*  CREATININE 1.68* 1.61*  CALCIUM 9.5 9.3   GFR: CrCl cannot be calculated (Unknown ideal weight.). Liver Function Tests:  Recent Labs Lab 08/29/16 1837  AST 29  ALT 22  ALKPHOS 71  BILITOT 0.4  PROT 5.7*  ALBUMIN 2.3*   No results for input(s): LIPASE, AMYLASE in the last 168 hours. No results for input(s): AMMONIA in the last 168 hours. Coagulation Profile: No results for input(s): INR, PROTIME in the last 168 hours. Cardiac Enzymes: No results for input(s): CKTOTAL, CKMB, CKMBINDEX, TROPONINI in the last 168 hours. BNP (last 3 results) No results for input(s): PROBNP in the last 8760 hours. HbA1C: No results for input(s): HGBA1C in the last 72 hours. CBG: No results for input(s): GLUCAP in the last 168 hours. Lipid Profile: No results for input(s): CHOL, HDL, LDLCALC, TRIG, CHOLHDL, LDLDIRECT in the last 72 hours. Thyroid Function Tests: No results for input(s): TSH, T4TOTAL, FREET4, T3FREE, THYROIDAB in the last 72 hours. Anemia Panel: No results for input(s): VITAMINB12, FOLATE, FERRITIN, TIBC, IRON, RETICCTPCT in the last 72 hours.  Sepsis Labs: No results for input(s): PROCALCITON, LATICACIDVEN in the last 168 hours.  No results found for this or any previous visit (from the past 240 hour(s)).       Radiology Studies: Ct Head Wo Contrast  Result Date: 08/29/2016 CLINICAL DATA:  Acute onset confusion. Falls at home. History of breast  cancer, chronic renal insufficiency. EXAM: CT HEAD WITHOUT CONTRAST TECHNIQUE: Contiguous axial images were obtained from the base of the skull through the vertex without intravenous contrast. COMPARISON:  None. FINDINGS: BRAIN: The ventricles and sulci are normal for age. No intraparenchymal hemorrhage, mass effect nor midline shift. Patchy supratentorial white matter hypodensities within normal range for patient's age, though non-specific are most compatible with chronic small vessel  ischemic disease. No acute large vascular territory infarcts. No abnormal extra-axial fluid collections. Basal cisterns are patent. VASCULAR: Mild calcific atherosclerosis of the carotid siphons. SKULL: No skull fracture. Tiny lucencies in the calvarium. No significant scalp soft tissue swelling. SINUSES/ORBITS: Focal mucosal thickening versus air-fluid level RIGHT sphenoid sinus. The included ocular globes and orbital contents are non-suspicious. OTHER: None. IMPRESSION: No acute intracranial process. Mild chronic small vessel ischemic disease. Tiny calvarial lucencies are likely benign though given patient's history of cancer metastasis not excluded. These results will be called to the ordering clinician or representative by the Radiologist Assistant, and communication documented in the [facility] Dashboard Electronically Signed   By: Elon Alas M.D.   On: 08/29/2016 21:53        Scheduled Meds: . buPROPion  150 mg Oral q morning - 10a  .  ceFAZolin (ANCEF) IV  2 g Intravenous Q8H  . enoxaparin (LOVENOX) injection  40 mg Subcutaneous Q24H  . feeding supplement  1 Container Oral TID BM  . metoprolol tartrate  12.5 mg Oral BID  . metroNIDAZOLE  500 mg Oral Q8H  . potassium chloride (KCL MULTIRUN) 30 mEq in 265 mL IVPB  30 mEq Intravenous Once  . sodium hypochlorite   Irrigation BID  . white petrolatum       Continuous Infusions: . sodium chloride       LOS: 0 days       [facility], MD [facility] Pager [phone]  If 7PM-7AM, please contact night-coverage [email or web address] Password [email or web address] 08/30/2016, 12:52 PM

## 2016-08-30 NOTE — Progress Notes (Signed)
  Echocardiogram 2D Echocardiogram has been performed.  Aubrey Voong L Androw 08/30/2016, 2:27 PM

## 2016-08-30 NOTE — Progress Notes (Signed)
  Subjective: Feels better, eating has some appetite  Objective: Vital signs in last 24 hours: Temp:  [97.6 F (36.4 C)-98.2 F (36.8 C)] 98.2 F (36.8 C) (12/14 1344) Pulse Rate:  [97-129] 97 (12/14 1344) Resp:  [16-18] 18 (12/14 0449) BP: (93-121)/(42-62) 107/62 (12/14 1344) SpO2:  [97 %-100 %] 100 % (12/14 1344)    Intake/Output from previous day: 12/13 0701 - 12/14 0700 In: 1732.5 [P.O.:360; I.V.:1372.5] Out: 425 [Urine:425] Intake/Output this shift: Total I/O In: -  Out: 600 [Urine:600]   cv irregular Lungs decreased bases Left mastectomy incision with decreased erythema, granulating wound  Lab Results:   Recent Labs  08/29/16 1837 08/30/16 0155  WBC 22.7* 22.6*  HGB 9.6* 9.6*  HCT 29.2* 29.0*  PLT 464* 502*   BMET  Recent Labs  08/29/16 1837 08/30/16 0155  NA 129* 129*  K 3.0* 2.8*  CL 94* 94*  CO2 22 25  GLUCOSE 130* 150*  BUN 35* 34*  CREATININE 1.68* 1.61*  CALCIUM 9.5 9.3   PT/INR No results for input(s): LABPROT, INR in the last 72 hours. ABG No results for input(s): PHART, HCO3 in the last 72 hours.  Invalid input(s): PCO2, PO2  Studies/Results: Ct Head Wo Contrast  Result Date: 08/29/2016 CLINICAL DATA:  Acute onset confusion. Falls at home. History of breast cancer, chronic renal insufficiency. EXAM: CT HEAD WITHOUT CONTRAST TECHNIQUE: Contiguous axial images were obtained from the base of the skull through the vertex without intravenous contrast. COMPARISON:  None. FINDINGS: BRAIN: The ventricles and sulci are normal for age. No intraparenchymal hemorrhage, mass effect nor midline shift. Patchy supratentorial white matter hypodensities within normal range for patient's age, though non-specific are most compatible with chronic small vessel ischemic disease. No acute large vascular territory infarcts. No abnormal extra-axial fluid collections. Basal cisterns are patent. VASCULAR: Mild calcific atherosclerosis of the carotid siphons.  SKULL: No skull fracture. Tiny lucencies in the calvarium. No significant scalp soft tissue swelling. SINUSES/ORBITS: Focal mucosal thickening versus air-fluid level RIGHT sphenoid sinus. The included ocular globes and orbital contents are non-suspicious. OTHER: None. IMPRESSION: No acute intracranial process. Mild chronic small vessel ischemic disease. Tiny calvarial lucencies are likely benign though given patient's history of cancer metastasis not excluded. These results will be called to the ordering clinician or representative by the Radiologist Assistant, and communication documented in the [facility] Dashboard Electronically Signed   By: Elon Alas M.D.   On: 08/29/2016 21:53    Anti-infectives: Anti-infectives    Start     Dose/Rate Route Frequency Ordered Stop   08/29/16 2200  ceFAZolin (ANCEF) IVPB 2g/100 mL premix     2 g 200 mL/hr over 30 Minutes Intravenous Every 8 hours 08/29/16 1748     08/29/16 1800  metroNIDAZOLE (FLAGYL) tablet 500 mg     500 mg Oral Every 8 hours 08/29/16 1723        Assessment/Plan: Postop wound infection She is better today, continue abx Recheck labs in am Appreciate medical assistance with her Cards seeing for afib  [facility] 08/30/2016

## 2016-08-30 NOTE — Evaluation (Signed)
Physical Therapy Evaluation Patient Details Name: Debra Griffith MRN: [id number] DOB: 11-24-1940 Today's Date: 08/30/2016   History of Present Illness  75 y.o. female with PMH of left breast cancer status post lumpectomy followed by reduction which was complicated by necrosis leading to eventual mastectomy that left her with an open wound that is being followed by surgery in the office, HTN, rheumatoid arthritis, morbid obesity, macular degeneration, anxiety & depression, presented to general surgeons office with complaints of increased drainage from left breast site wound, generalized weakness, decreased appetite, falls and confusion. She underwent I&D of the left breast site wound in the office and was admitted to [facility] for further evaluation and management. She was noted to have dehydration, hypotension, acute kidney injury.  Clinical Impression  Pt admitted with above diagnosis. Pt currently with functional limitations due to the deficits listed below (see PT Problem List).  Pt will benefit from skilled PT to increase their independence and safety with mobility to allow discharge to the venue listed below.  Pt with overall generalized weakness.  She would benefit from OT consult in acute care and HHPT at time of d/c. She has supportive family.     Follow Up Recommendations Home health PT;Supervision for mobility/OOB    Equipment Recommendations  None recommended by PT    Recommendations for Other Services OT consult     Precautions / Restrictions Precautions Precautions: Fall      Mobility  Bed Mobility Overal bed mobility: Needs Assistance Bed Mobility: Supine to Sit     Supine to sit: Min assist     General bed mobility comments: Cues for technique, but needed MIN A to get fully upright  Transfers Overall transfer level: Needs assistance Equipment used: Rolling walker (2 wheeled) Transfers: Sit to/from Stand Sit to Stand: Min guard;Min assist         General  transfer comment: min/guard for safety from bed.  Min A from low toilet  Ambulation/Gait Ambulation/Gait assistance: Min guard;Min assist Ambulation Distance (Feet): 25 Feet Assistive device: Rolling walker (2 wheeled) Gait Pattern/deviations: Decreased step length - right;Decreased step length - left;Trunk flexed Gait velocity: decreased   General Gait Details: min/guard with straight path, but MIN A with turning due to IV pole and for safety  Stairs            Wheelchair Mobility    Modified Rankin (Stroke Patients Only)       Balance Overall balance assessment: History of Falls;Needs assistance         Standing balance support: Bilateral upper extremity supported Standing balance-Leahy Scale: Poor                               Pertinent Vitals/Pain Pain Assessment: No/denies pain    Home Living Family/patient expects to be discharged to:: Private residence Living Arrangements: Spouse/significant other Available Help at Discharge: Family Type of Home: House Home Access: Stairs to enter Entrance Stairs-Rails: None Entrance Stairs-Number of Steps: 1 Home Layout: Multi-level;Bed/bath upstairs Home Equipment: Environmental consultant - 2 wheels;Walker - 4 wheels;Other (comment) (electric bed) Additional Comments: RW for upstairs and rollator downstairs.     Prior Function Level of Independence: Independent with assistive device(s)         Comments: Normally does not ambulate with AD in the house, but has been using rollator last several days due to feeling poorly.     Hand Dominance   Dominant Hand: Right  Extremity/Trunk Assessment   Upper Extremity Assessment Upper Extremity Assessment: Generalized weakness    Lower Extremity Assessment Lower Extremity Assessment: Generalized weakness    Cervical / Trunk Assessment Cervical / Trunk Assessment: Normal  Communication   Communication: No difficulties  Cognition Arousal/Alertness:  Awake/alert Behavior During Therapy: WFL for tasks assessed/performed Overall Cognitive Status: Impaired/Different from baseline Area of Impairment: Problem solving;Safety/judgement;Awareness;Memory     Memory: Decreased short-term memory   Safety/Judgement: Decreased awareness of safety Awareness: Emergent Problem Solving: Slow processing General Comments: Pt talking around things. Easily distracted.  Daughter reports this is different.    General Comments General comments (skin integrity, edema, etc.): Pt unable to wipe self at toilet    Exercises     Assessment/Plan    PT Assessment Patient needs continued PT services  PT Problem List Decreased strength;Decreased activity tolerance;Decreased balance;Decreased cognition;Decreased mobility          PT Treatment Interventions DME instruction;Gait training;Functional mobility training;Therapeutic activities;Therapeutic exercise;Patient/family education    PT Goals (Current goals can be found in the Care Plan section)  Acute Rehab PT Goals Patient Stated Goal: go home PT Goal Formulation: With patient/family Time For Goal Achievement: 09/13/16 Potential to Achieve Goals: Good    Frequency Min 3X/week   Barriers to discharge        Co-evaluation               End of Session Equipment Utilized During Treatment: Gait belt Activity Tolerance: Patient limited by fatigue Patient left: in chair;with call bell/phone within reach;with chair alarm set Nurse Communication: Mobility status    Functional Assessment Tool Used: clinical judgment and objective findings Functional Limitation: Mobility: Walking and moving around Mobility: Walking and Moving Around Current Status [phone]): At least 1 percent but less than 20 percent impaired, limited or restricted Mobility: Walking and Moving Around Goal Status [phone]): At least 1 percent but less than 20 percent impaired, limited or restricted    Time: 1200 (minus MD  visit)-1259 PT Time Calculation (min) (ACUTE ONLY): 59 min   Charges:   PT Evaluation $PT Eval Moderate Complexity: 1 Procedure PT Treatments $Gait Training: 8-22 mins   PT G Codes:   PT G-Codes **NOT FOR INPATIENT CLASS** Functional Assessment Tool Used: clinical judgment and objective findings Functional Limitation: Mobility: Walking and moving around Mobility: Walking and Moving Around Current Status [id number]): At least 1 percent but less than 20 percent impaired, limited or restricted Mobility: Walking and Moving Around Goal Status [phone]): At least 1 percent but less than 20 percent impaired, limited or restricted    [facility] LUBECK 08/30/2016, 1:47 PM

## 2016-08-30 NOTE — Consult Note (Signed)
Cardiology Consult    Patient ID: Debra Griffith MRN: [id number], DOB/AGE: 1941/05/27   Admit date: 08/29/2016 Date of Consult: 08/30/2016  Primary Physician: Henrine Screws, MD Reason for Consult: Atrial Fibrillation Primary Cardiologist: New - Dr. Marlou Porch Requesting Provider: Dr. Donne Hazel   History of Present Illness    Debra Griffith is a 75 y.o. female with past medical history of left breast cancer (s/p lumpectomy with reduction complicated by necrosis with full mastectomy performed on 08/02/2016 and continued open wound followed by surgery), RA, HTN, Stage 3 CKD and known RBBB who presented to [facility] on 08/29/2016 as a direct admission from the surgeon's office for treatment of her infected left mastectomy wound site.   Reported having increased drainage along her surgical site along with complaints of generalized weakness, decreased appetite, and frequent falls. In talking with the patient's family, she was very confused as compared to baseline. Also experienced 3 falls within the past week which occurred when her "legs became very weak". She is usually very stable according to her husband, having only experienced 1 fall in the past several years prior to this week.  Initial labs showed a WBC of 22.7, Hgb 9.6, platelets 464. Na+ 129, K+ 3.0, and creatinine 1.68. CT Head showed no acute intracranial process. EKG shows atrial fibrillation, HR 93, with known RBBB. Was hypotensive initially with systolic readings in the [id number]. PTA Amlodipine 10mg  daily, Lopressor 25mg  BID, Maxzide 37.5-25mg  daily, and Valsartan 320mg  daily were held on admission. Lopressor has been restarted to assist with rate control and she is receiving IVF for her AKI thought to be secondary to dehydration.  She does report being told she had an irregular heart rate in the past following a knee surgery. Does not think it was atrial fibrillation or atrial flutter. Was informed the rhythm was benign and she  did not need treatment for it. Has never been seen by a Cardiologist.   Maintaining atrial fibrillation on telemetry with HR ranging from the 90's to 120's.    Past Medical History   Past Medical History:  Diagnosis Date  . Anemia    hx of   . Anxiety   . Breast cancer in female [facility]) 03/2016   left  . Chronic bronchitis ([place])    FLARE UPS USUALLY ONCE A YEAR  . Chronic lower back pain   . Complication of anesthesia    trouble waking up   . CRI (chronic renal insufficiency) 07/27/2016  . DDD (degenerative disc disease), lumbar   . Depression   . GERD (gastroesophageal reflux disease)   . History of recent fall    "twice on Sunday; once yesterday" (08/29/2016)  . Hypertension   . Inflammatory arthritis 07/27/2016   Sero Negative, Positive Synovitis hands, WJ  . Insomnia   . Irregular heart beats   . Macular degeneration of right eye   . Obesity   . Osteoarthritis of both feet 07/27/2016  . Osteoarthritis of both hands 07/27/2016  . PONV (postoperative nausea and vomiting)    SEVERE N&V AND DIARRHEA AFTER HYSTERECTOMY AND AFTER WISDOM TEETH EXTRACTIONS - NO PROBLEMS WITH LAST 2 SURGERIES - THE HIP AND LEFT KNEE  . RBBB (right bundle branch block) 07/27/2016  . Rheumatoid arthritis [facility])    "doctor recently took me off all RX for this" (08/02/2016)  . Right bundle branch block   . Right bundle branch block   . S/p dental crown    dental crowns on every tooth  .  Vitamin D deficiency     Past Surgical History:  Procedure Laterality Date  . ABDOMINAL HYSTERECTOMY    . AXILLARY SURGERY Left 06/25/2016   Aspiration of left axillary seroma   . BREAST BIOPSY Left 03/2016  . BREAST LUMPECTOMY WITH RADIOACTIVE SEED AND SENTINEL LYMPH NODE BIOPSY Left 06/13/2016   Procedure: LEFT BREAST LUMPECTOMY WITH BRACKETED  RADIOACTIVE SEED AND SENTINEL LYMPH NODE BIOPSY;  Surgeon: Rolm Bookbinder, MD;  Location: [place];  Service: General;  Laterality: Left;  . BREAST RECONSTRUCTION  Bilateral 06/25/2016   BILATERAL ONCOPLASTIC BREAST RECONSTRUCTION WITH BREAST REDUCTION  . BREAST RECONSTRUCTION WITH PLACEMENT OF TISSUE EXPANDER AND FLEX HD (ACELLULAR HYDRATED DERMIS) Bilateral 06/25/2016   Procedure: BILATERAL ONCOPLASTIC BREAST RECONSTRUCTION WITH BREAST REDUCTION, Aspiration of left axillary seroma;  Surgeon: Irene Limbo, MD;  Location: [place];  Service: Plastics;  Laterality: Bilateral;  . BREAST REDUCTION SURGERY Bilateral 06/25/2016   Procedure: MAMMARY REDUCTION  (BREAST) BILATERAL;  Surgeon: Irene Limbo, MD;  Location: [place];  Service: Plastics;  Laterality: Bilateral;  . CESAREAN SECTION  1966; 1971; 1973  . COLONOSCOPY    . DEBRIDEMENT AND CLOSURE WOUND Right 07/11/2016   Procedure: DEBRIDEMENT LEFT BREAST;  Surgeon: Irene Limbo, MD;  Location: [place];  Service: Plastics;  Laterality: Right;  . JOINT REPLACEMENT    . KNEE ARTHROSCOPY Right    "before replacement"  . MASTECTOMY COMPLETE / SIMPLE Left 08/02/2016   total  . REDUCTION MAMMAPLASTY Bilateral 06/25/2016  . TOTAL HIP ARTHROPLASTY  06/24/2012   Procedure: TOTAL HIP ARTHROPLASTY;  Surgeon: [name], MD;  Location: [facility];  Service: Orthopedics;  Laterality: Left;  . TOTAL KNEE ARTHROPLASTY N/A 11/02/2013   Procedure: LEFT TOTAL KNEE ARTHROPLASTY WITH RIGHT KNEE CORTISONE INJECTION;  Surgeon: [name], MD;  Location: [facility];  Service: Orthopedics;  Laterality: N/A;  . TOTAL KNEE ARTHROPLASTY Right 05/31/2014   Procedure: RIGHT TOTAL KNEE ARTHROPLASTY;  Surgeon: [name], MD;  Location: [facility];  Service: Orthopedics;  Laterality: Right;  . TOTAL MASTECTOMY Left 08/02/2016   Procedure: LEFT TOTAL MASTECTOMY;  Surgeon: Rolm Bookbinder, MD;  Location: [place];  Service: General;  Laterality: Left;  . TUBAL LIGATION  1973  . WISDOM TOOTH EXTRACTION  1970's   admitted to hospital for surgery     Allergies  Allergies  Allergen Reactions  . Morphine And Related Other (See  Comments)    hallucinations    Inpatient Medications    . buPROPion  150 mg Oral q morning - 10a  .  ceFAZolin (ANCEF) IV  2 g Intravenous Q8H  . enoxaparin (LOVENOX) injection  40 mg Subcutaneous Q24H  . feeding supplement  1 Container Oral TID BM  . metoprolol tartrate  25 mg Oral BID  . metroNIDAZOLE  500 mg Oral Q8H  . sodium hypochlorite   Irrigation BID  . white petrolatum        Family History    Family History  Problem Relation Age of Onset  . Diabetes Mother     Social History    Social History   Social History  . Marital status: Married    Spouse name: N/A  . Number of children: N/A  . Years of education: N/A   Occupational History  . Not on file.   Social History Main Topics  . Smoking status: Never Smoker  . Smokeless tobacco: Never Used  . Alcohol use No  . Drug use: No  . Sexual activity: Not Currently  Other Topics Concern  . Not on file   Social History Narrative  . No narrative on file     Review of Systems    General:  No chills, fever, night sweats or weight changes. Positive for generalized fatigue and weakness. Cardiovascular:  No chest pain, dyspnea on exertion, edema, orthopnea, palpitations, paroxysmal nocturnal dyspnea. Dermatological: No rash, lesions/masses Respiratory: No cough, dyspnea Urologic: No hematuria, dysuria Abdominal:   No nausea, vomiting, diarrhea, bright red blood per rectum, melena, or hematemesis Neurologic:  No visual changes. Positive for changes in mental status. All other systems reviewed and are otherwise negative except as noted above.  Physical Exam    Blood pressure 107/62, pulse 97, temperature 98.2 F (36.8 C), temperature source Oral, resp. rate 18, SpO2 100 %.  General: Pleasant, elderly Caucasian female appearing in NAD. Dressing in place along left pectoral region.   Psych: Normal affect. Neuro: Alert and oriented X 2 (person, place). Moves all extremities spontaneously. HEENT:  Normal  Neck: Supple without bruits or JVD. Lungs:  Resp regular and unlabored, CTA without wheezing or rales. Heart: Irregularly irregular, no s3, s4, or murmurs. Abdomen: Soft, non-tender, non-distended, BS + x 4.  Extremities: No clubbing or cyanosis. Trace lower extremity edema bilaterally to mid-shins. DP/PT/Radials 2+ and equal bilaterally.  Labs    Troponin (Point of Care Test) No results for input(s): TROPIPOC in the last 72 hours. No results for input(s): CKTOTAL, CKMB, TROPONINI in the last 72 hours. Lab Results  Component Value Date   WBC 22.6 (H) 08/30/2016   HGB 9.6 (L) 08/30/2016   HCT 29.0 (L) 08/30/2016   MCV 82.2 08/30/2016   PLT 502 (H) 08/30/2016     Recent Labs Lab 08/29/16 1837 08/30/16 0155  NA 129* 129*  K 3.0* 2.8*  CL 94* 94*  CO2 22 25  BUN 35* 34*  CREATININE 1.68* 1.61*  CALCIUM 9.5 9.3  PROT 5.7*  --   BILITOT 0.4  --   ALKPHOS 71  --   ALT 22  --   AST 29  --   GLUCOSE 130* 150*   No results found for: CHOL, HDL, LDLCALC, TRIG No results found for: [facility]   Radiology Studies    Ct Head Wo Contrast  Result Date: 08/29/2016 CLINICAL DATA:  Acute onset confusion. Falls at home. History of breast cancer, chronic renal insufficiency. EXAM: CT HEAD WITHOUT CONTRAST TECHNIQUE: Contiguous axial images were obtained from the base of the skull through the vertex without intravenous contrast. COMPARISON:  None. FINDINGS: BRAIN: The ventricles and sulci are normal for age. No intraparenchymal hemorrhage, mass effect nor midline shift. Patchy supratentorial white matter hypodensities within normal range for patient's age, though non-specific are most compatible with chronic small vessel ischemic disease. No acute large vascular territory infarcts. No abnormal extra-axial fluid collections. Basal cisterns are patent. VASCULAR: Mild calcific atherosclerosis of the carotid siphons. SKULL: No skull fracture. Tiny lucencies in the calvarium. No significant  scalp soft tissue swelling. SINUSES/ORBITS: Focal mucosal thickening versus air-fluid level RIGHT sphenoid sinus. The included ocular globes and orbital contents are non-suspicious. OTHER: None. IMPRESSION: No acute intracranial process. Mild chronic small vessel ischemic disease. Tiny calvarial lucencies are likely benign though given patient's history of cancer metastasis not excluded. These results will be called to the ordering clinician or representative by the Radiologist Assistant, and communication documented in the [facility] Dashboard Electronically Signed   By: Elon Alas M.D.   On: 08/29/2016 21:53    EKG &  Cardiac Imaging    EKG:  Atrial fibrillation, HR 93, with known RBBB.   Echocardiogram: Pending  Assessment & Plan    1. New Onset Atrial Fibrillation - admitted with a wound infection following recent mastectomy along with dehydration and AKI. Found to be in new onset atrial fibrillation with RVR which is confirmed on her EKG.  - atrial fibrillation likely triggered by her acute infection and electrolyte abnormalities.  - restarted on Lopressor 25mg  BID for rate-control. Can titrate as needed for improved HR control as BP allows.  - This patients CHA2DS2-VASc Score and unadjusted Ischemic Stroke Rate (% per year) is equal to 3.2 % stroke rate/year from a score of 3 (HTN, Female, Age). Would recommend full-dose anticoagulation once cleared from a surgical perspective. Discussed briefly with Dr. Donne Hazel, will await decision regarding going to the OR tomorrow prior to initiating a NOAC.   2. HTN - has actually been hypotensive this admission in the setting of dehydration. BP 93/42  -121/62 in the past 24 hours. - Was on Amlodipine 10mg  daily, Lopressor 25mg  BID, Maxzide 37.5-25mg  daily, and Valsartan 320mg  daily prior to admission. Lopressor has been restarted to assist with rate control. Will likely need to further titrate this and d/c some of her prior anti-hypertensive  medications.  3. AKI - baseline creatinine 1.1 - 1.3. Elevated to 1.68 on admission. Agree with holding ARB and HCTZ at this time. - receiving IVF.   4. Hypokalemia - K+ 2.8 this morning. Being replaced.  - repeat BMET in AM.   5. Left Breast Cancer - s/p lumpectomy with reduction complicated by necrosis with full mastectomy performed on 08/02/2016 and continued open wound followed by surgery.   - remains on IV antibiotics. - per Surgery.   6. AMS - A&Ox2 but confused when answering some questions. Patient's daughter states this is not her baseline.  - CT Head on admission with no acute intracranial abnormalities. Tiny calvarial lucencies were noted, likely benign though given patient's history of cancer metastasis not excluded. - per admitting team.   Signed, Erma Heritage, PA-C 08/30/2016, 3:39 PM Pager: [phone]  Personally seen and examined. Agree with above. I witnessed her get from bathroom to bed with walker and she is clearly severely deconditioned. Was not able to reposition herself in bed without help of husband and caretaker.   Afib - likely exacerbated from infection. WBC elevated. As infection improves, she may autoconvert.  Irreg irreg, mildly tachycardic, morbidly obese.  Agree with metoprolol.  No indication for urgent cardioversion Replete K 2.8 EF normal, with normal LA size Would like to start NOAC when comfortable from Dr. Cristal Generous standpoint.   Candee Furbish, MD

## 2016-08-31 DIAGNOSIS — I481 Persistent atrial fibrillation: Secondary | ICD-10-CM

## 2016-08-31 DIAGNOSIS — I4891 Unspecified atrial fibrillation: Secondary | ICD-10-CM

## 2016-08-31 DIAGNOSIS — R410 Disorientation, unspecified: Secondary | ICD-10-CM

## 2016-08-31 LAB — BASIC METABOLIC PANEL
Anion gap: 11 (ref 5–15)
BUN: 18 mg/dL (ref 6–20)
CO2: 22 mmol/L (ref 22–32)
Calcium: 9.7 mg/dL (ref 8.9–10.3)
Chloride: 106 mmol/L (ref 101–111)
Creatinine, Ser: 1.05 mg/dL — ABNORMAL HIGH (ref 0.44–1.00)
GFR calc Af Amer: 59 mL/min — ABNORMAL LOW (ref >60)
GFR calc non Af Amer: 51 mL/min — ABNORMAL LOW (ref >60)
GLUCOSE: 130 mg/dL — AB (ref 65–99)
POTASSIUM: 3.8 mmol/L (ref 3.5–5.1)
Sodium: 139 mmol/L (ref 135–145)

## 2016-08-31 LAB — CBC
HEMATOCRIT: 30.4 % — AB (ref 36.0–46.0)
HEMOGLOBIN: 9.8 g/dL — AB (ref 12.0–15.0)
MCH: 26.7 pg (ref 26.0–34.0)
MCHC: 32.2 g/dL (ref 30.0–36.0)
MCV: 82.8 fL (ref 78.0–100.0)
Platelets: 598 10*3/uL — ABNORMAL HIGH (ref 150–400)
RBC: 3.67 MIL/uL — AB (ref 3.87–5.11)
RDW: 15.5 % (ref 11.5–15.5)
WBC: 14.6 10*3/uL — ABNORMAL HIGH (ref 4.0–10.5)

## 2016-08-31 MED ORDER — CLONAZEPAM 1 MG PO TABS
2.0000 mg | ORAL_TABLET | Freq: Two times a day (BID) | ORAL | Status: DC
  Administered 2016-08-31 – 2016-09-02 (×4): 2 mg via ORAL
  Filled 2016-08-31 (×5): qty 2

## 2016-08-31 MED ORDER — APIXABAN 5 MG PO TABS
5.0000 mg | ORAL_TABLET | Freq: Two times a day (BID) | ORAL | Status: DC
  Administered 2016-08-31 – 2016-09-02 (×5): 5 mg via ORAL
  Filled 2016-08-31 (×5): qty 1

## 2016-08-31 NOTE — Progress Notes (Signed)
Physical Therapy Treatment Patient Details Name: Debra Griffith MRN: [id number] DOB: 26-May-1941 Today's Date: 08/31/2016    History of Present Illness 75 y.o. female with PMH of left breast cancer status post lumpectomy followed by reduction which was complicated by necrosis leading to eventual mastectomy that left her with an open wound that is being followed by surgery in the office, HTN, rheumatoid arthritis, morbid obesity, macular degeneration, anxiety & depression, presented to general surgeons office with complaints of increased drainage from left breast site wound, generalized weakness, decreased appetite, falls and confusion. She underwent I&D of the left breast site wound in the office and was admitted to [facility] for further evaluation and management. She was noted to have dehydration, hypotension, acute kidney injury.    PT Comments    Pt fatigues quickly.  She did take opportunity while family was not in room to mention she was very embarrassed that she could not wipe self.  Explained that OT will be evaluating her and they will be able to help educate her, but offered a few ideas.  Con't to recommend HHPT.  Follow Up Recommendations  Home health PT;Supervision for mobility/OOB     Equipment Recommendations  None recommended by PT    Recommendations for Other Services OT consult     Precautions / Restrictions Precautions Precautions: Fall Restrictions Weight Bearing Restrictions: No    Mobility  Bed Mobility Overal bed mobility: Needs Assistance Bed Mobility: Supine to Sit     Supine to sit: Min assist     General bed mobility comments: MIN A to get trunk fully upright  Transfers Overall transfer level: Needs assistance Equipment used: Rolling walker (2 wheeled) Transfers: Sit to/from Stand Sit to Stand: Min guard         General transfer comment: cues for hand placement  Ambulation/Gait Ambulation/Gait assistance: Min guard Ambulation Distance (Feet): 20  Feet Assistive device: Rolling walker (2 wheeled) Gait Pattern/deviations: Decreased step length - right;Decreased step length - left;Trunk flexed Gait velocity: decreased   General Gait Details: min/guard for short distance gait. Pt too fatigued to ambulate farther. She states she has been up walking with nursing to bathroom today.   Stairs            Wheelchair Mobility    Modified Rankin (Stroke Patients Only)       Balance Overall balance assessment: History of Falls;Needs assistance Sitting-balance support: Feet supported Sitting balance-Leahy Scale: Good     Standing balance support: Bilateral upper extremity supported Standing balance-Leahy Scale: Poor                      Cognition Arousal/Alertness: Awake/alert Behavior During Therapy: WFL for tasks assessed/performed Overall Cognitive Status: Impaired/Different from baseline Area of Impairment: Problem solving;Safety/judgement;Awareness;Memory     Memory: Decreased short-term memory   Safety/Judgement: Decreased awareness of safety Awareness: Emergent Problem Solving: Slow processing General Comments: Easily distracted. Tangiential conversation at times.    Exercises      General Comments        Pertinent Vitals/Pain Pain Assessment: Faces Faces Pain Scale: Hurts little more Pain Location: hips with scooting back in chair Pain Descriptors / Indicators: Grimacing Pain Intervention(s): Repositioned    Home Living                      Prior Function            PT Goals (current goals can now be found in the care plan  section) Acute Rehab PT Goals Patient Stated Goal: go home PT Goal Formulation: With patient/family Time For Goal Achievement: 09/13/16 Potential to Achieve Goals: Good Progress towards PT goals: Progressing toward goals    Frequency    Min 3X/week      PT Plan Current plan remains appropriate    Co-evaluation             End of Session  Equipment Utilized During Treatment: Gait belt Activity Tolerance: Patient limited by fatigue Patient left: in chair;with call bell/phone within reach     Time: 1425-1440 PT Time Calculation (min) (ACUTE ONLY): 15 min  Charges:  $Gait Training: 8-22 mins                    G Codes:      Waymond Meador LUBECK 08/31/2016, 2:53 PM

## 2016-08-31 NOTE — Progress Notes (Addendum)
Patient Name: Debra Griffith Date of Encounter: 08/31/2016  Primary Cardiologist: [facility] Problem List     Active Problems:   Essential hypertension   Cellulitis of left breast   Dehydration   Hypotension   Acute confusional state   Cellulitis     Subjective   Feels better, no CP, no SOB.   Inpatient Medications    Scheduled Meds: . buPROPion  150 mg Oral q morning - 10a  .  ceFAZolin (ANCEF) IV  2 g Intravenous Q8H  . enoxaparin (LOVENOX) injection  40 mg Subcutaneous Q24H  . feeding supplement (ENSURE ENLIVE)  237 mL Oral BID BM  . metoprolol tartrate  25 mg Oral BID  . metroNIDAZOLE  500 mg Oral Q8H  . multivitamin with minerals  1 tablet Oral Daily  . sodium hypochlorite   Irrigation BID   Continuous Infusions: . sodium chloride 0.9 % 1,000 mL with potassium chloride 40 mEq infusion 100 mL/hr at 08/31/16 0700   PRN Meds: acetaminophen **OR** acetaminophen, methocarbamol, metoprolol, ondansetron **OR** ondansetron (ZOFRAN) IV, oxyCODONE, simethicone   Vital Signs    Vitals:   08/30/16 1707 08/30/16 2122 08/30/16 2139 08/31/16 0410  BP:  110/60 110/60 112/66  Pulse:  94 94 88  Resp:  17  17  Temp:  98.1 F (36.7 C)  98 F (36.7 C)  TempSrc:  Oral  Oral  SpO2:  98%  96%  Weight: 265 lb (120.2 kg)     Height: 5' (1.524 m)       Intake/Output Summary (Last 24 hours) at 08/31/16 0920 Last data filed at 08/31/16 0700  Gross per 24 hour  Intake          2841.67 ml  Output             1900 ml  Net           941.67 ml   Filed Weights   08/30/16 1707  Weight: 265 lb (120.2 kg)    Physical Exam    GEN: Well nourished, well developed, in no acute distress. Sitting in chair.  HEENT: Grossly normal.  Neck: Supple, no JVD, carotid bruits, or masses. Cardiac: RRR, no murmurs, rubs, or gallops. No clubbing, cyanosis, edema.  Radials/DP/PT 2+ and equal bilaterally.  Respiratory:  Respirations regular and unlabored, clear to auscultation  bilaterally. GI: Soft, nontender, nondistended, BS + x 4. Obese MS: no deformity or atrophy. Skin: warm and dry, no rash. Neuro:  Strength and sensation are intact. Psych: AAOx3.  Normal affect. Not  confused. Seems improved.   Labs    CBC  Recent Labs  08/30/16 0155 08/31/16 0616  WBC 22.6* 14.6*  HGB 9.6* 9.8*  HCT 29.0* 30.4*  MCV 82.2 82.8  PLT 502* [id number]*   Basic Metabolic Panel  Recent Labs  08/30/16 0155 08/30/16 1518 08/31/16 0616  NA 129*  --  139  K 2.8*  --  3.8  CL 94*  --  106  CO2 25  --  22  GLUCOSE 150*  --  130*  BUN 34*  --  18  CREATININE 1.61*  --  1.05*  CALCIUM 9.3  --  9.7  MG  --  1.9  --    Liver Function Tests  Recent Labs  08/29/16 1837  AST 29  ALT 22  ALKPHOS 71  BILITOT 0.4  PROT 5.7*  ALBUMIN 2.3*    Recent Labs  08/30/16 1518  TSH 2.062    Telemetry  AFIB rate controlled - Personally Reviewed  ECG    AFIB - Personally Reviewed  Radiology    Ct Head Wo Contrast  Result Date: 08/29/2016 CLINICAL DATA:  Acute onset confusion. Falls at home. History of breast cancer, chronic renal insufficiency. EXAM: CT HEAD WITHOUT CONTRAST TECHNIQUE: Contiguous axial images were obtained from the base of the skull through the vertex without intravenous contrast. COMPARISON:  None. FINDINGS: BRAIN: The ventricles and sulci are normal for age. No intraparenchymal hemorrhage, mass effect nor midline shift. Patchy supratentorial white matter hypodensities within normal range for patient's age, though non-specific are most compatible with chronic small vessel ischemic disease. No acute large vascular territory infarcts. No abnormal extra-axial fluid collections. Basal cisterns are patent. VASCULAR: Mild calcific atherosclerosis of the carotid siphons. SKULL: No skull fracture. Tiny lucencies in the calvarium. No significant scalp soft tissue swelling. SINUSES/ORBITS: Focal mucosal thickening versus air-fluid level RIGHT sphenoid sinus.  The included ocular globes and orbital contents are non-suspicious. OTHER: None. IMPRESSION: No acute intracranial process. Mild chronic small vessel ischemic disease. Tiny calvarial lucencies are likely benign though given patient's history of cancer metastasis not excluded. These results will be called to the ordering clinician or representative by the Radiologist Assistant, and communication documented in the z[facility]ision Dashboard Electronically Signed   By: Elon Alas M.D.   On: 08/29/2016 21:53    Cardiac Studies   ECHO - Left ventricle: The cavity size was normal. There was moderate   concentric hypertrophy. Systolic function was normal. The   estimated ejection fraction was in the range of 55% to 60%. Wall   motion was normal; there were no regional wall motion   abnormalities. LA size normal  Patient Profile     75 year old with atrial fibrillation, persistent, in the setting of breast wound infection [id number]  Assessment & Plan    AFIB persistent   - OK to start Eliquis 5mg  PO QD. Will start.   - rate controlled  - EF reassuring  - continue with metoprolol  OK for DC from cardiac perspective.  Will see in follow up. Will arrange appt. Signing off.   Signed, Candee Furbish, MD  08/31/2016, 9:20 AM

## 2016-08-31 NOTE — Care Management Note (Signed)
Case Management Note  Patient Details  Name: Debra Griffith MRN: [id number] Date of Birth: 1941-08-26  Subjective/Objective:                    Action/Plan:  Confirmed face sheet information with patient and family at bedside. Choice offered patient and family would like [place]. Family requesting [facility] to call patient's husband [name] [phone] to arrange visits. [name] with [facility] aware . Expected Discharge Date:                  Expected Discharge Plan:  [place]  In-House Referral:     Discharge planning Services  CM Consult  Post Acute Care Choice:  Home Health Choice offered to:  Patient, Spouse, Adult Children  DME Arranged:    DME Agency:     HH Arranged:  RN, PT, OT, Nurse's Aide [place] Agency:  [place]  Status of Service:  Completed, signed off  If discussed at [place] of Stay Meetings, dates discussed:    Additional Comments:  Marilu Favre, RN 08/31/2016, 12:18 PM

## 2016-08-31 NOTE — Progress Notes (Signed)
Subjective: Still with some confusion, is alert/oriented times three this am though, tired, has not slept, wants ambien, eating and drinking feels better overall  Objective: Vital signs in last 24 hours: Temp:  [98 F (36.7 C)-98.2 F (36.8 C)] 98 F (36.7 C) (12/15 0410) Pulse Rate:  [88-129] 88 (12/15 0410) Resp:  [17] 17 (12/15 0410) BP: (106-121)/(52-66) 112/66 (12/15 0410) SpO2:  [96 %-100 %] 96 % (12/15 0410) Weight:  [120.2 kg (265 lb)] 120.2 kg (265 lb) (12/14 1707)    Intake/Output from previous day: 12/14 0701 - 12/15 0700 In: 2841.7 [P.O.:780; I.V.:1961.7; IV Piggyback:100] Out: 1900 [Urine:1900] Intake/Output this shift: No intake/output data recorded.  Lungs clear cv irreg Left mastectomy wound open area granulating, lateral area with decreased drainage, minimal erythema   Lab Results:   Recent Labs  08/30/16 0155 08/31/16 0616  WBC 22.6* 14.6*  HGB 9.6* 9.8*  HCT 29.0* 30.4*  PLT 502* 598*   BMET  Recent Labs  08/30/16 0155 08/31/16 0616  NA 129* 139  K 2.8* 3.8  CL 94* 106  CO2 25 22  GLUCOSE 150* 130*  BUN 34* 18  CREATININE 1.61* 1.05*  CALCIUM 9.3 9.7   PT/INR No results for input(s): LABPROT, INR in the last 72 hours. ABG No results for input(s): PHART, HCO3 in the last 72 hours.  Invalid input(s): PCO2, PO2  Studies/Results: Ct Head Wo Contrast  Result Date: 08/29/2016 CLINICAL DATA:  Acute onset confusion. Falls at home. History of breast cancer, chronic renal insufficiency. EXAM: CT HEAD WITHOUT CONTRAST TECHNIQUE: Contiguous axial images were obtained from the base of the skull through the vertex without intravenous contrast. COMPARISON:  None. FINDINGS: BRAIN: The ventricles and sulci are normal for age. No intraparenchymal hemorrhage, mass effect nor midline shift. Patchy supratentorial white matter hypodensities within normal range for patient's age, though non-specific are most compatible with chronic small vessel  ischemic disease. No acute large vascular territory infarcts. No abnormal extra-axial fluid collections. Basal cisterns are patent. VASCULAR: Mild calcific atherosclerosis of the carotid siphons. SKULL: No skull fracture. Tiny lucencies in the calvarium. No significant scalp soft tissue swelling. SINUSES/ORBITS: Focal mucosal thickening versus air-fluid level RIGHT sphenoid sinus. The included ocular globes and orbital contents are non-suspicious. OTHER: None. IMPRESSION: No acute intracranial process. Mild chronic small vessel ischemic disease. Tiny calvarial lucencies are likely benign though given patient's history of cancer metastasis not excluded. These results will be called to the ordering clinician or representative by the Radiologist Assistant, and communication documented in the [facility] Dashboard Electronically Signed   By: Elon Alas M.D.   On: 08/29/2016 21:53    Anti-infectives: Anti-infectives    Start     Dose/Rate Route Frequency Ordered Stop   08/29/16 2200  ceFAZolin (ANCEF) IVPB 2g/100 mL premix     2 g 200 mL/hr over 30 Minutes Intravenous Every 8 hours 08/29/16 1748     08/29/16 1800  metroNIDAZOLE (FLAGYL) tablet 500 mg     500 mg Oral Every 8 hours 08/29/16 1723        Assessment/Plan: Postop infection  This is much better today, I think will heal with abx and dressing changes Will continue iv abx today, if continues to improve transition to augmentin for total of 10 days to complete Will need to dc with bid dressing changes with packing to lateral wound and wet to dry to larger open wound  She may be ready for dc late tomorrow or more likely Sunday ot consult today,  pt recs reviewed, Im still not sure home is best place for her pending ot recs She can start noac for afib today  Appreciate cards and medicine help  [facility] 08/31/2016

## 2016-08-31 NOTE — Evaluation (Signed)
Occupational Therapy Evaluation Patient Details Name: Debra Griffith MRN: [id number] DOB: 10-Dec-1940 Today's Date: 08/31/2016    History of Present Illness 75 y.o. female with PMH of left breast cancer status post lumpectomy followed by reduction which was complicated by necrosis leading to eventual mastectomy that left her with an open wound that is being followed by surgery in the office, HTN, rheumatoid arthritis, morbid obesity, macular degeneration, anxiety & depression, presented to general surgeons office with complaints of increased drainage from left breast site wound, generalized weakness, decreased appetite, falls and confusion. She underwent I&D of the left breast site wound in the office and was admitted to [facility] for further evaluation and management. She was noted to have dehydration, hypotension, acute kidney injury.   Clinical Impression   Pt was independent with AD prior to admission. Presents with impaired cognition, generalized weakness and poor activity tolerance interfering with ability to perform self care at her baseline. Will follow acutely. Recommending HHOT upon d/c.    Follow Up Recommendations  Home health OT    Equipment Recommendations  None recommended by OT    Recommendations for Other Services       Precautions / Restrictions Precautions Precautions: Fall Restrictions Weight Bearing Restrictions: No      Mobility Bed Mobility Overal bed mobility: Needs Assistance Bed Mobility: Supine to Sit;Sit to Supine     Supine to sit: Min assist Sit to supine: Min assist   General bed mobility comments: min for trunk with supine to sit and LEs back into bed  Transfers Overall transfer level: Needs assistance Equipment used: Rolling walker (2 wheeled) Transfers: Sit to/from Stand Sit to Stand: Min guard         General transfer comment: cues for hand placement    Balance Overall balance assessment: History of Falls;Needs  assistance Sitting-balance support: Feet supported Sitting balance-Leahy Scale: Good     Standing balance support: Bilateral upper extremity supported Standing balance-Leahy Scale: Poor                              ADL Overall ADL's : Needs assistance/impaired Eating/Feeding: Independent;Sitting   Grooming: Wash/dry hands;Wash/dry face;Sitting;Set up   Upper Body Bathing: Minimal assistance;Sitting   Lower Body Bathing: Sit to/from stand;Minimal assistance   Upper Body Dressing : Minimal assistance;Sitting   Lower Body Dressing: Minimal assistance;Sit to/from stand   Toilet Transfer: Min guard;Stand-pivot;RW   Toileting- Water quality scientist and Hygiene: Maximal assistance;Sit to/from stand         General ADL Comments: Pt with concerns about not being able to perform pericare independently. Educated in use of toilet tongs.     Vision     Perception     Praxis      Pertinent Vitals/Pain Pain Assessment: No/denies pain Faces Pain Scale: Hurts little more Pain Location: hips with scooting back in chair Pain Descriptors / Indicators: Grimacing Pain Intervention(s): Repositioned     Hand Dominance Right   Extremity/Trunk Assessment Upper Extremity Assessment Upper Extremity Assessment: Generalized weakness   Lower Extremity Assessment Lower Extremity Assessment: Defer to PT evaluation   Cervical / Trunk Assessment Cervical / Trunk Assessment: Normal   Communication Communication Communication: No difficulties   Cognition Arousal/Alertness: Awake/alert Behavior During Therapy: WFL for tasks assessed/performed Overall Cognitive Status: Impaired/Different from baseline Area of Impairment: Problem solving;Safety/judgement;Awareness;Memory     Memory: Decreased short-term memory   Safety/Judgement: Decreased awareness of safety Awareness: Emergent Problem Solving: Slow processing  General Comments: Easily distracted. Tangential conversation  at times.   General Comments       Exercises       Shoulder Instructions      Home Living Family/patient expects to be discharged to:: Private residence Living Arrangements: Spouse/significant other Available Help at Discharge: Family;Available 24 hours/day Type of Home: House Home Access: Stairs to enter [employer] of Steps: 1 Entrance Stairs-Rails: None Home Layout: Multi-level;Bed/bath upstairs Alternate Level Stairs-Number of Steps: stair lift   Bathroom Shower/Tub: Hospital doctor Toilet: Handicapped height     Home Equipment: Environmental consultant - 2 wheels;Walker - 4 wheels;Other (comment) (electric bed)   Additional Comments: RW for upstairs and rollator downstairs.       Prior Functioning/Environment Level of Independence: Independent with assistive device(s)        Comments: Normally does not ambulate with AD in the house, but has been using rollator last several days due to feeling poorly.        OT Problem List: Decreased strength;Decreased activity tolerance;Impaired balance (sitting and/or standing);Decreased cognition;Decreased knowledge of use of DME or AE;Obesity   OT Treatment/Interventions: Self-care/ADL training;DME and/or AE instruction;Patient/family education;Balance training;Therapeutic activities    OT Goals(Current goals can be found in the care plan section) Acute Rehab OT Goals Patient Stated Goal: go home OT Goal Formulation: With patient Time For Goal Achievement: 09/07/16 Potential to Achieve Goals: Good ADL Goals Pt Will Perform Grooming: with supervision;standing Pt Will Perform Lower Body Bathing: with supervision;with adaptive equipment;sit to/from stand Pt Will Perform Lower Body Dressing: with supervision;sit to/from stand;with adaptive equipment Pt Will Transfer to Toilet: with supervision;ambulating;bedside commode (over toilet) Pt Will Perform Toileting - Clothing Manipulation and hygiene: with supervision;with  adaptive equipment;sit to/from stand  OT Frequency: Min 2X/week   Barriers to D/C:            Co-evaluation              End of Session Equipment Utilized During Treatment: Rolling walker;Gait belt  Activity Tolerance: Patient limited by fatigue Patient left: in bed;with call bell/phone within reach;with bed alarm set   Time: [id number] OT Time Calculation (min): 19 min Charges:  OT General Charges $OT Visit: 1 Procedure OT Evaluation $OT Eval Moderate Complexity: 1 Procedure G-Codes:    Malka So 08/31/2016, 3:46 PM  [phone]

## 2016-08-31 NOTE — Discharge Instructions (Addendum)
Information on my medicine - ELIQUIS (apixaban)  This medication education was reviewed with me or my healthcare representative as part of my discharge preparation.  The pharmacist that spoke with me during my hospital stay was:  Jaquita Folds, [facility]  Why was Eliquis prescribed for you? Eliquis was prescribed for you to reduce the risk of a blood clot forming that can cause a stroke if you have a medical condition called atrial fibrillation (a type of irregular heartbeat).  What do You need to know about Eliquis ? Take your Eliquis TWICE DAILY - one tablet in the morning and one tablet in the evening with or without food. If you have difficulty swallowing the tablet whole please discuss with your pharmacist how to take the medication safely.  Take Eliquis exactly as prescribed by your doctor and DO NOT stop taking Eliquis without talking to the doctor who prescribed the medication.  Stopping may increase your risk of developing a stroke.  Refill your prescription before you run out.  After discharge, you should have regular check-up appointments with your healthcare provider that is prescribing your Eliquis.  In the future your dose may need to be changed if your kidney function or weight changes by a significant amount or as you get older.  What do you do if you miss a dose? If you miss a dose, take it as soon as you remember on the same day and resume taking twice daily.  Do not take more than one dose of ELIQUIS at the same time to make up a missed dose.  Important Safety Information A possible side effect of Eliquis is bleeding. You should call your healthcare provider right away if you experience any of the following: ? Bleeding from an injury or your nose that does not stop. ? Unusual colored urine (red or dark brown) or unusual colored stools (red or black). ? Unusual bruising for unknown reasons. ? A serious fall or if you hit your head (even if there is no bleeding).  Some  medicines may interact with Eliquis and might increase your risk of bleeding or clotting while on Eliquis. To help avoid this, consult your healthcare provider or pharmacist prior to using any new prescription or non-prescription medications, including herbals, vitamins, non-steroidal anti-inflammatory drugs (NSAIDs) and supplements.  This website has more information on Eliquis (apixaban): http://www.eliquis.com/eliquis/home    Atrial Fibrillation Atrial fibrillation is a type of irregular or rapid heartbeat (arrhythmia). In atrial fibrillation, the heart quivers continuously in a chaotic pattern. This occurs when parts of the heart receive disorganized signals that make the heart unable to pump blood normally. This can increase the risk for stroke, heart failure, and other heart-related conditions. There are different types of atrial fibrillation, including:  Paroxysmal atrial fibrillation. This type starts suddenly, and it usually stops on its own shortly after it starts.  Persistent atrial fibrillation. This type often lasts longer than a week. It may stop on its own or with treatment.  Long-lasting persistent atrial fibrillation. This type lasts longer than 12 months.  Permanent atrial fibrillation. This type does not go away. Talk with your health care provider to learn about the type of atrial fibrillation that you have. What are the causes? This condition is caused by some heart-related conditions or procedures, including:  A heart attack.  Coronary artery disease.  Heart failure.  Heart valve conditions.  High blood pressure.  Inflammation of the sac that surrounds the heart (pericarditis).  Heart surgery.  Certain heart  rhythm disorders, such as Wolf-Parkinson-White syndrome. Other causes include:  Pneumonia.  Obstructive sleep apnea.  Blockage of an artery in the lungs (pulmonary embolism, or PE).  Lung cancer.  Chronic lung disease.  Thyroid problems,  especially if the thyroid is overactive (hyperthyroidism).  Caffeine.  Excessive alcohol use or illegal drug use.  Use of some medicines, including certain decongestants and diet pills. Sometimes, the cause cannot be found. What increases the risk? This condition is more likely to develop in:  People who are older in age.  People who smoke.  People who have diabetes mellitus.  People who are overweight (obese).  Athletes who exercise vigorously. What are the signs or symptoms? Symptoms of this condition include:  A feeling that your heart is beating rapidly or irregularly.  A feeling of discomfort or pain in your chest.  Shortness of breath.  Sudden light-headedness or weakness.  Getting tired easily during exercise. In some cases, there are no symptoms. How is this diagnosed? Your health care provider may be able to detect atrial fibrillation when taking your pulse. If detected, this condition may be diagnosed with:  An electrocardiogram (ECG).  A Holter monitor test that records your heartbeat patterns over a 24-hour period.  Transthoracic echocardiogram (TTE) to evaluate how blood flows through your heart.  Transesophageal echocardiogram (TEE) to view more detailed images of your heart.  A stress test.  Imaging tests, such as a CT scan or chest X-ray.  Blood tests. How is this treated? The main goals of treatment are to prevent blood clots from forming and to keep your heart beating at a normal rate and rhythm. The type of treatment that you receive depends on many factors, such as your underlying medical conditions and how you feel when you are experiencing atrial fibrillation. This condition may be treated with:  Medicine to slow down the heart rate, bring the hearts rhythm back to normal, or prevent clots from forming.  Electrical cardioversion. This is a procedure that resets your hearts rhythm by delivering a controlled, low-energy shock to the heart  through your skin.  Different types of ablation, such as catheter ablation, catheter ablation with pacemaker, or surgical ablation. These procedures destroy the heart tissues that send abnormal signals. When the pacemaker is used, it is placed under your skin to help your heart beat in a regular rhythm. Follow these instructions at home:  Take over-the counter and prescription medicines only as told by your health care provider.  If your health care provider prescribed a blood-thinning medicine (anticoagulant), take it exactly as told. Taking too much blood-thinning medicine can cause bleeding. If you do not take enough blood-thinning medicine, you will not have the protection that you need against stroke and other problems.  Do not use tobacco products, including cigarettes, chewing tobacco, and e-cigarettes. If you need help quitting, ask your health care provider.  If you have obstructive sleep apnea, manage your condition as told by your health care provider.  Do not drink alcohol.  Do not drink beverages that contain caffeine, such as coffee, soda, and tea.  Maintain a healthy weight. Do not use diet pills unless your health care provider approves. Diet pills may make heart problems worse.  Follow diet instructions as told by your health care provider.  Exercise regularly as told by your health care provider.  Keep all follow-up visits as told by your health care provider. This is important. How is this prevented?  Avoid drinking beverages that contain caffeine or  alcohol.  Avoid certain medicines, especially medicines that are used for breathing problems.  Avoid certain herbs and herbal medicines, such as those that contain ephedra or ginseng.  Do not use illegal drugs, such as cocaine and amphetamines.  Do not smoke.  Manage your high blood pressure. Contact a health care provider if:  You notice a change in the rate, rhythm, or strength of your heartbeat.  You are  taking an anticoagulant and you notice increased bruising.  You tire more easily when you exercise or exert yourself. Get help right away if:  You have chest pain, abdominal pain, sweating, or weakness.  You feel nauseous.  You notice blood in your vomit, bowel movement, or urine.  You have shortness of breath.  You suddenly have swollen feet and ankles.  You feel dizzy.  You have sudden weakness or numbness of the face, arm, or leg, especially on one side of the body.  You have trouble speaking, trouble understanding, or both (aphasia).  Your face or your eyelid droops on one side. These symptoms may represent a serious problem that is an emergency. Do not wait to see if the symptoms will go away. Get medical help right away. Call your local emergency services (911 in the [place] Do not drive yourself to the hospital.  This information is not intended to replace advice given to you by your health care provider. Make sure you discuss any questions you have with your health care provider. Document Released: 09/03/2005 Document Revised: 01/11/2016 Document Reviewed: 12/29/2014 Elsevier Interactive Patient Education  2017 [employer] Inc.   Acute Kidney Injury, Adult Acute kidney injury (AKI) occurs when there is sudden (acute) damage to the kidneys.A small amount of kidney damage may not cause problems, but a large amount of damage may make it difficult or impossible for the kidneys to work the way they should. AKI may develop into long-lasting (chronic) kidney disease. Early detection and treatment of AKI may prevent kidney damage from becoming permanent or getting worse. What are the causes? Common causes of this condition include:  A problem with blood flow to the kidneys. This may be caused by:  Blood loss.  Heart and blood vessel (cardiovascular) disease.  Severe burns.  Liver disease.  Direct damage to the kidneys. This may be caused by:  Some medicines.  A kidney  infection.  Poisoning.  Being around or in contact with poisonous (toxic) substances.  A surgical wound.  A hard, direct force to the kidney area.  A sudden blockage of urine flow. This may be caused by:  Cancer.  Kidney stones.  Enlarged prostate in males. What are the signs or symptoms? Symptoms develop slowly and may not be obvious until the kidney damage becomes severe. It is possible to have AKI for years without showing any symptoms. Symptoms of this condition can include:  Swelling (edema) of the face, legs, ankles, or feet.  Numbness, tingling, or loss of feeling (sensation) in the hands or feet.  Tiredness (lethargy).  Nausea or vomiting.  Confusion or trouble concentrating.  Problems with urination, such as:  Painful or burning feeling during urination.  Decreased urine production.  Frequent urination, especially at night.  Bloody urine.  Muscle twitches and cramps, especially in the legs.  Shortness of breath.  Weakness.  Constant itchiness.  Loss of appetite.  Metallic taste in the mouth.  Trouble sleeping.  Pale lining of the eyelids and surface of the eye (conjunctiva). How is this diagnosed? This condition may be  diagnosed with various tests. Tests may include:  Blood tests.  Urine tests.  Imaging tests.  A test in which a sample of tissue is removed from the kidneys to be looked at under a microscope (kidney biopsy). How is this treated? Treatment of AKIvaries depending on the cause and severity of the kidney damage. In mild cases, treatment may not be needed. The kidneys may heal on their own. If AKI is more severe, your health care provider will treat the cause of the kidney damage, help the kidneys heal, and prevent problems from occurring. Severe cases may require a procedure to remove toxic wastes from the body (dialysis) or surgery to repair kidney damage. Surgery may involve:  Repair of a torn kidney.  Removal of a urine  flow obstruction. Follow these instructions at home:  Follow your prescribed diet.  Take over-the-counter and prescription medicines only as told by your health care provider.  Do not take any new medicines unless approved by your health care provider. Many medicines can worsen your kidney damage.  Do not take any vitamin and mineral supplements unless approved by your health care provider. Many nutritional supplements can worsen your kidney damage.  The dose of some medicines that you take may need to be adjusted.  Do not use any tobacco products, such as cigarettes, chewing tobacco, and e-cigarettes. If you need help quitting, ask your health care provider.  Keep all follow-up visits as told by your health care provider. This is important.  Keep track of your blood pressure. Report changes in your blood pressure as told by your health care provider.  Achieve and maintain a healthy weight. If you need help with this, ask your health care provider.  Start or continue an exercise plan. Try to exercise at least 30 minutes a day, 5 days a week.  Stay current with immunizations as told by your health care provider. Where to find more information:  American Association of Kidney Patients: BombTimer.gl  National Kidney Foundation: www.kidney.[place]: https://mathis.com/  [facility]: www.lifeoptions.org and www.kidneyschool.org Contact a health care provider if:  Your symptoms get worse.  You develop new symptoms. Get help right away if:  You develop symptoms of end-stage kidney disease, which include:  Headaches.  Abnormally dark or light skin.  Numbness in the hands or feet.  Easy bruising.  Frequent hiccups.  Chest pain.  Shortness of breath.  End of menstruation in women.  You have a fever.  You have decreased urine production.  You have pain or bleeding when you urinate. This information is not intended to replace  advice given to you by your health care provider. Make sure you discuss any questions you have with your health care provider. Document Released: 03/19/2011 Document Revised: 04/12/2016 Document Reviewed: 05/02/2012 Elsevier Interactive Patient Education  2017 [employer].

## 2016-08-31 NOTE — Progress Notes (Addendum)
PROGRESS NOTE  AALIA FOLKES  [id number] DOB: Dec 28, 1940  DOA: 08/29/2016 PCP: Henrine Screws, MD  Outpatient Specialists:  Oncology: Dr. Jana Hakim  Brief Narrative:  75 y.o. female with PMH of left breast cancer status post lumpectomy followed by reduction which was complicated by necrosis leading to eventual mastectomy that left her with an open wound that is being followed by surgery in the office, HTN, rheumatoid arthritis, morbid obesity, macular degeneration, anxiety & depression, presented to general surgeons office with complaints of increased drainage from left breast site wound, generalized weakness, decreased appetite, falls and confusion. She underwent I&D of the left breast site wound in the office and was admitted to [facility] for further evaluation and management. She was noted to have dehydration, hypotension, acute kidney injury. On 12/14, noted new onset A. fib.   Assessment & Plan:   Active Problems:   Essential hypertension   Cellulitis of left breast   Dehydration   Hypotension   Acute confusional state   Cellulitis   1. Cellulitis/abscess of left mastectomy site wound: Status post I&D in office by general surgery on 08/29/16 and admitted to [facility] for IV antibiotics. Continue empirically started IV Ancef and oral Flagyl. Plastic surgery input appreciated. As per Surgery follow up: improving, continue additional day of IV Abx and then to PO Augmentin at DC. 2. Hypotension: Likely secondary to dehydration and antihypertensives. IV fluid hydration. Held antihypertensives on day of admission. Stopped triamterene-HCTZ, amlodipine and valsartan. Resumed metoprolol. Clinically not septic. Resolved. IV fluids discontinued 12/15. 3. Dehydration with hyponatremia: Secondary to poor oral intake and diuretics. IV fluids completed. Resolved. DC HCTZ indefinitely. 4. Confusion: May be multifactorial secondary to dehydration, hypotension, infection, pain medications, acute kidney  injury. Treat underlying cause as above. Minimize narcotic pain medications. No syncope reported. CT head without acute findings. Seems better but fluctuating at times. 5. Mechanical falls at home: May be related to acute medical illness listed above. Treat as above. PT evaluation recommended home health PT. OT input pending. 6. Essential hypertension: Management as per problem number 2. Depending on how her blood pressure does, may need to hold Diovan until outpatient follow-up with PCP. 7. Acute on Stage III chronic kidney disease: Baseline creatinine may be in the 1.3 range. Presented with creatinine of 1.68. Holding diuretics, ARB, treated with gentle IV fluids. Acute kidney injury resolved.  8. Anemia: Last hemoglobin 9.1 on 08/03/16. Stable. 9. Anxiety and depression:  currently held due to confusion present on admission. May consider resuming. 10. Left breast cancer status post mastectomy: Arimidex on hold due to acute infection. Outpatient follow-up with oncology. Supposed to start radiation treatment when surgical issues are resolved. Noncontrast CT head 08/29/16 reports tiny calvarial lucencies which are likely benign though given patient's history of cancer metastasis not excluded-outpatient follow-up with oncology.  11. A. fib, new diagnosis/RBBB: Patient asymptomatic. Intermittent mild RVR in the 100s-110s. Increase metoprolol back to home dose as blood pressure tolerates. TSH normal. 2-D echo with normal EF. CHA2DS2-VASc Score: 3. Cardiology consultation and follow-up appreciated, continue metoprolol, NOAC initiated and will follow outpatient with cardiology. As discussed with cardiology, DC ASA 81 MG that she was on prior to admission (due to starting NOAC). 12. Leukocytosis: Likely secondary to problem #1. Follow CBCs. Improving. 13. Hypokalemia: Replaced. Magnesium 1.9.   DVT prophylaxis: Lovenox Code Status: Full Family Communication: Discussed in detail with patient's daughter Suanne Marker  at bedside on 08/30/16. None at bedside today. Disposition Plan: As per primary service, possible discharge in 1-2 days  pending OT input. Should be medically stable for discharge.   Consultants:   [facility] are consultants.   Cardiology-signed off  Procedures:   None  Antimicrobials:   IV Unasyn 12/13>  PO Flagyl 12/13>    Subjective: No specific complaints. At times seems confused. However mental status better than on admission. No pain reported. As per RN, no acute issues.  Objective:  Vitals:   08/30/16 2122 08/30/16 2139 08/31/16 0410 08/31/16 1031  BP: 110/60 110/60 112/66 116/66  Pulse: 94 94 88 96  Resp: 17  17   Temp: 98.1 F (36.7 C)  98 F (36.7 C)   TempSrc: Oral  Oral   SpO2: 98%  96%   Weight:      Height:        Intake/Output Summary (Last 24 hours) at 08/31/16 1120 Last data filed at 08/31/16 0700  Gross per 24 hour  Intake          2841.67 ml  Output             1500 ml  Net          1341.67 ml   Filed Weights   08/30/16 1707  Weight: 120.2 kg (265 lb)    Examination:  General exam: Moderately built and obese female patient lying comfortably supine in bed.  Respiratory system: Clear to auscultation. Respiratory effort normal. Cardiovascular system: S1 & S2 heard, irregularly irregular. No JVD, murmurs, rubs, gallops or clicks. No pedal edema.Telemetry: A. fib with ventricular rate in the 90s-100s. Gastrointestinal system: Abdomen is nondistended, soft and nontender. No organomegaly or masses felt. Normal bowel sounds heard. Central nervous system: Alert and oriented to person, place and partly to time . No focal neurological deficits. Extremities: Symmetric 5 x 5 power. Skin: dressing over left upper anterior lateral chest wall at I&D site, clean and dry.  Psychiatry: Judgement and insight appear normal. Mood & affect appropriate.     Data Reviewed: I have personally reviewed following labs and imaging studies  CBC:  Recent Labs Lab  08/29/16 1837 08/30/16 0155 08/31/16 0616  WBC 22.7* 22.6* 14.6*  HGB 9.6* 9.6* 9.8*  HCT 29.2* 29.0* 30.4*  MCV 82.0 82.2 82.8  PLT 464* 502* [id number]*   Basic Metabolic Panel:  Recent Labs Lab 08/29/16 1837 08/30/16 0155 08/30/16 1518 08/31/16 0616  NA 129* 129*  --  139  K 3.0* 2.8*  --  3.8  CL 94* 94*  --  106  CO2 22 25  --  22  GLUCOSE 130* 150*  --  130*  BUN 35* 34*  --  18  CREATININE 1.68* 1.61*  --  1.05*  CALCIUM 9.5 9.3  --  9.7  MG  --   --  1.9  --    GFR: Estimated Creatinine Clearance: 56 mL/min (by C-G formula based on SCr of 1.05 mg/dL (H)). Liver Function Tests:  Recent Labs Lab 08/29/16 1837  AST 29  ALT 22  ALKPHOS 71  BILITOT 0.4  PROT 5.7*  ALBUMIN 2.3*   No results for input(s): LIPASE, AMYLASE in the last 168 hours. No results for input(s): AMMONIA in the last 168 hours. Coagulation Profile: No results for input(s): INR, PROTIME in the last 168 hours. Cardiac Enzymes: No results for input(s): CKTOTAL, CKMB, CKMBINDEX, TROPONINI in the last 168 hours. BNP (last 3 results) No results for input(s): PROBNP in the last 8760 hours. HbA1C: No results for input(s): HGBA1C in the last 72 hours. CBG: No results  for input(s): GLUCAP in the last 168 hours. Lipid Profile: No results for input(s): CHOL, HDL, LDLCALC, TRIG, CHOLHDL, LDLDIRECT in the last 72 hours. Thyroid Function Tests:  Recent Labs  08/30/16 1518  TSH 2.062   Anemia Panel: No results for input(s): VITAMINB12, FOLATE, FERRITIN, TIBC, IRON, RETICCTPCT in the last 72 hours.  Sepsis Labs: No results for input(s): PROCALCITON, LATICACIDVEN in the last 168 hours.  No results found for this or any previous visit (from the past 240 hour(s)).       Radiology Studies: Ct Head Wo Contrast  Result Date: 08/29/2016 CLINICAL DATA:  Acute onset confusion. Falls at home. History of breast cancer, chronic renal insufficiency. EXAM: CT HEAD WITHOUT CONTRAST TECHNIQUE: Contiguous  axial images were obtained from the base of the skull through the vertex without intravenous contrast. COMPARISON:  None. FINDINGS: BRAIN: The ventricles and sulci are normal for age. No intraparenchymal hemorrhage, mass effect nor midline shift. Patchy supratentorial white matter hypodensities within normal range for patient's age, though non-specific are most compatible with chronic small vessel ischemic disease. No acute large vascular territory infarcts. No abnormal extra-axial fluid collections. Basal cisterns are patent. VASCULAR: Mild calcific atherosclerosis of the carotid siphons. SKULL: No skull fracture. Tiny lucencies in the calvarium. No significant scalp soft tissue swelling. SINUSES/ORBITS: Focal mucosal thickening versus air-fluid level RIGHT sphenoid sinus. The included ocular globes and orbital contents are non-suspicious. OTHER: None. IMPRESSION: No acute intracranial process. Mild chronic small vessel ischemic disease. Tiny calvarial lucencies are likely benign though given patient's history of cancer metastasis not excluded. These results will be called to the ordering clinician or representative by the Radiologist Assistant, and communication documented in the [facility] Dashboard Electronically Signed   By: Elon Alas M.D.   On: 08/29/2016 21:53        Scheduled Meds: . apixaban  5 mg Oral BID  . buPROPion  150 mg Oral q morning - 10a  .  ceFAZolin (ANCEF) IV  2 g Intravenous Q8H  . feeding supplement (ENSURE ENLIVE)  237 mL Oral BID BM  . metoprolol tartrate  25 mg Oral BID  . metroNIDAZOLE  500 mg Oral Q8H  . multivitamin with minerals  1 tablet Oral Daily  . sodium hypochlorite   Irrigation BID   Continuous Infusions:    LOS: 1 day       [facility], MD [facility] Pager [phone]  If 7PM-7AM, please contact night-coverage [email or web address] Password [email or web address] 08/31/2016, 11:20 AM

## 2016-08-31 NOTE — Progress Notes (Signed)
   Plastic Surgery   HD# 3 left chest seroma s/p spontaneous drainage, open wound.  Started on Eliquis.  Temp:  [98 F (36.7 C)-98.1 F (36.7 C)] 98 F (36.7 C) (12/15 1221) Pulse Rate:  [86-96] 86 (12/15 1221) Resp:  [17] 17 (12/15 0410) BP: (110-118)/(60-66) 118/62 (12/15 1221) SpO2:  [96 %-98 %] 98 % (12/15 1221) Weight:  [120.2 kg (265 lb)] 120.2 kg (265 lb) (12/14 1707)   PO 780 yesterday  PE Just received Klonopin and somewhat somnolent Dressing dry- exam deferred as changed this am   A/P  Home Health arranged.  Possible home over weekend. Would recommend collagen to midline wound every other day - this can be ordered by home health. Will call patient next week for follow up appt.  Irene Limbo, MD [facility] Plastic & Reconstructive Surgery [phone], pin [phone]

## 2016-09-01 DIAGNOSIS — I959 Hypotension, unspecified: Secondary | ICD-10-CM

## 2016-09-01 LAB — CBC
HEMATOCRIT: 28.7 % — AB (ref 36.0–46.0)
HEMOGLOBIN: 9.2 g/dL — AB (ref 12.0–15.0)
MCH: 26.7 pg (ref 26.0–34.0)
MCHC: 32.1 g/dL (ref 30.0–36.0)
MCV: 83.2 fL (ref 78.0–100.0)
Platelets: 505 10*3/uL — ABNORMAL HIGH (ref 150–400)
RBC: 3.45 MIL/uL — ABNORMAL LOW (ref 3.87–5.11)
RDW: 15.6 % — ABNORMAL HIGH (ref 11.5–15.5)
WBC: 14 10*3/uL — ABNORMAL HIGH (ref 4.0–10.5)

## 2016-09-01 LAB — BASIC METABOLIC PANEL
Anion gap: 9 (ref 5–15)
BUN: 12 mg/dL (ref 6–20)
CHLORIDE: 108 mmol/L (ref 101–111)
CO2: 21 mmol/L — AB (ref 22–32)
CREATININE: 0.93 mg/dL (ref 0.44–1.00)
Calcium: 9.4 mg/dL (ref 8.9–10.3)
GFR calc Af Amer: >60 mL/min (ref >60)
GFR calc non Af Amer: 59 mL/min — ABNORMAL LOW (ref >60)
Glucose, Bld: 124 mg/dL — ABNORMAL HIGH (ref 65–99)
Potassium: 3.4 mmol/L — ABNORMAL LOW (ref 3.5–5.1)
SODIUM: 138 mmol/L (ref 135–145)

## 2016-09-01 MED ORDER — POTASSIUM CHLORIDE CRYS ER 20 MEQ PO TBCR
40.0000 meq | EXTENDED_RELEASE_TABLET | Freq: Once | ORAL | Status: AC
  Administered 2016-09-01: 40 meq via ORAL
  Filled 2016-09-01: qty 2

## 2016-09-01 NOTE — Progress Notes (Signed)
  Subjective: Intermittent confusion.  Alert and oriented mostly.  Carries on good conversation this morning.  Husband thinks that the confusion has been a problem at the hospital but not at home. Dressing changes going well.  Needs to and relate more. No specific complaints or requests this morning.  Objective: Vital signs in last 24 hours: Temp:  [97.6 F (36.4 C)-98.1 F (36.7 C)] 98.1 F (36.7 C) (12/16 0647) Pulse Rate:  [86-108] 102 (12/16 0647) Resp:  [18-19] 19 (12/16 0647) BP: (105-118)/(51-79) 106/79 (12/16 0647) SpO2:  [98 %-99 %] 99 % (12/16 0647) Last BM Date: 08/31/16  Intake/Output from previous day: 12/15 0701 - 12/16 0700 In: 580 [P.O.:480; IV Piggyback:100] Out: 1425 [Urine:1425] Intake/Output this shift: Total I/O In: 120 [P.O.:120] Out: 200 [Urine:200]  General appearance: Alert.  Very talkative.  Does not appear toxic or septic in any way.  Skin warm and dry. Resp: clear to auscultation bilaterally Breasts, no masses or tenderness, Left mastectomy wound with 2 open areas that are granulating well.  Minimal drainage.  Erythema seems to have resolved.  Lab Results:   Recent Labs  08/31/16 0616 09/01/16 0006  WBC 14.6* 14.0*  HGB 9.8* 9.2*  HCT 30.4* 28.7*  PLT 598* 505*   BMET  Recent Labs  08/31/16 0616 09/01/16 0006  NA 139 138  K 3.8 3.4*  CL 106 108  CO2 22 21*  GLUCOSE 130* 124*  BUN 18 12  CREATININE 1.05* 0.93  CALCIUM 9.7 9.4   PT/INR No results for input(s): LABPROT, INR in the last 72 hours. ABG No results for input(s): PHART, HCO3 in the last 72 hours.  Invalid input(s): PCO2, PO2  Studies/Results: No results found.  Anti-infectives: Anti-infectives    Start     Dose/Rate Route Frequency Ordered Stop   08/29/16 2200  ceFAZolin (ANCEF) IVPB 2g/100 mL premix     2 g 200 mL/hr over 30 Minutes Intravenous Every 8 hours 08/29/16 1748     08/29/16 1800  metroNIDAZOLE (FLAGYL) tablet 500 mg     500 mg Oral Every 8 hours  08/29/16 1723        Assessment/Plan: HD#4 - left chest seroma status post spontaneous drainage, open wounds. Postop left mastectomy wound infection. Much better today Anticipate healing by secondary intention with dressing changes and antibiotics  HHN is being arranged.  Husband is in favor of this  Probable discharge tomorrow Dr. Iran Planas recommends collagen to midline wound every other day and home health can do this. Dr. Iran Planas wishes to see patient in one week  Atrial fibrillation persists.  Started on eliquis.  Continue with beta blockers.  Appreciate cardiology evaluation. Cardiology has signed off and state they will arrange outpatient appointment. Appreciate Dr. Marlou Porch cardiology advice.  Acute confusional state.  Slowly improving.   LOS: 2 days    Darletta Noblett M 09/01/2016

## 2016-09-01 NOTE — Progress Notes (Signed)
PROGRESS NOTE  Debra Griffith  [id number] DOB: Oct 26, 1940  DOA: 08/29/2016 PCP: Henrine Screws, MD  Outpatient Specialists:  Oncology: Dr. Jana Hakim  Brief Narrative:  75 y.o. female with PMH of left breast cancer status post lumpectomy followed by reduction which was complicated by necrosis leading to eventual mastectomy that left her with an open wound that is being followed by surgery in the office, HTN, rheumatoid arthritis, morbid obesity, macular degeneration, anxiety & depression, presented to general surgeons office with complaints of increased drainage from left breast site wound, generalized weakness, decreased appetite, falls and confusion. She underwent I&D of the left breast site wound in the office and was admitted to [facility] for further evaluation and management. She was noted to have dehydration, hypotension, acute kidney injury. On 12/14, noted new onset A. fib.   Assessment & Plan:   Active Problems:   Essential hypertension   Cellulitis of left breast   Dehydration   Hypotension   Confusion   Cellulitis   New onset atrial fibrillation (HCC)   1. Cellulitis/abscess of left mastectomy site wound: Status post I&D in office by general surgery on 08/29/16 and admitted to [facility] for IV antibiotics. Continue empirically started IV Ancef and oral Flagyl. Plastic surgery input appreciated. As per Surgery follow up: improving, continue additional day of IV Abx and then to PO Augmentin at DC. 2. Hypotension: Likely secondary to dehydration and antihypertensives. IV fluid hydration. Held antihypertensives on day of admission. Stopped triamterene-HCTZ, amlodipine and valsartan. Resumed metoprolol. Clinically not septic. Resolved. IV fluids discontinued 12/15. Blood pressure  soft, so will need to stop  triamterene-HCTZ, amlodipine and valsartan, and to continue only with metoprolol discharge. 3. Dehydration with hyponatremia: Secondary to poor oral intake and diuretics. IV fluids  completed. Resolved. DC HCTZ indefinitely. 4. Confusion: May be multifactorial secondary to dehydration, hypotension, infection, pain medications, acute kidney injury. Treat underlying cause as above. Minimize narcotic pain medications. No syncope reported. CT head without acute findings. Seems better but fluctuating at times. 5. Mechanical falls at home: May be related to acute medical illness listed above. Treat as above. PT evaluation recommended home health PT. OT input pending. 6. Essential hypertension: Management as per problem number 2.Blood pressure  soft, so will need to stop  triamterene-HCTZ, amlodipine and valsartan, and to continue only with metoprolol discharge.  7. Acute on Stage III chronic kidney disease: Baseline creatinine may be in the 1.3 range. Presented with creatinine of 1.68. Holding diuretics, ARB, treated with gentle IV fluids. Acute kidney injury resolved.  8. Anemia: Last hemoglobin 9.1 on 08/03/16. Stable. 9. Anxiety and depression:  currently held due to confusion present on admission. May consider resuming. 10. Left breast cancer status post mastectomy: Arimidex on hold due to acute infection. Outpatient follow-up with oncology. Supposed to start radiation treatment when surgical issues are resolved. Noncontrast CT head 08/29/16 reports tiny calvarial lucencies which are likely benign though given patient's history of cancer metastasis not excluded-outpatient follow-up with oncology.  11. A. fib, new diagnosis/RBBB: Patient asymptomatic. Intermittent mild RVR in the 100s-110s. Increase metoprolol back to home dose as blood pressure tolerates. TSH normal. 2-D echo with normal EF. CHA2DS2-VASc Score: 3. Cardiology consultation and follow-up appreciated, continue metoprolol, NOAC initiated and will follow outpatient with cardiology. As discussed with cardiology, DC ASA 81 MG that she was on prior to admission (due to starting NOAC). 12. Leukocytosis: Likely secondary to problem  #1. Follow CBCs. Improving. 13. Hypokalemia: Replaced. Magnesium 1.9.   DVT prophylaxis: Lovenox Code  Status: Full Family Communication: Discussed in detail with patient's daughter Suanne Marker at bedside on 08/30/16. None at bedside today. Disposition Plan: As per primary service, possible discharge in 1-2 days pending OT input. Should be medically stable for discharge.   Consultants:   [facility] are consultants.   Cardiology-signed off  Procedures:   None  Antimicrobials:   IV Unasyn 12/13>  PO Flagyl 12/13>    Subjective: No specific complaints. At times seems confused. However mental status change improved per family bedside. No pain reported. Patient reports insomnia and poor night sleep  Objective:  Vitals:   08/31/16 1031 08/31/16 1221 08/31/16 2115 09/01/16 0647  BP: 116/66 118/62 (!) 105/51 106/79  Pulse: 96 86 (!) 108 (!) 102  Resp:   18 19  Temp:  98 F (36.7 C) 97.6 F (36.4 C) 98.1 F (36.7 C)  TempSrc:  Oral Oral Oral  SpO2:  98% 99% 99%  Weight:      Height:        Intake/Output Summary (Last 24 hours) at 09/01/16 1431 Last data filed at 09/01/16 1352  Gross per 24 hour  Intake              480 ml  Output             2050 ml  Net            -1570 ml   Filed Weights   08/30/16 1707  Weight: 120.2 kg (265 lb)    Examination:  General exam: Moderately built and obese female patient lying comfortably supine in bed.  Respiratory system: Clear to auscultation. Respiratory effort normal. Cardiovascular system: S1 & S2 heard, irregularly irregular. No JVD, murmurs, rubs, gallops or clicks. No pedal edema.Telemetry: A. fib with ventricular rate in the 90s-100s. Gastrointestinal system: Abdomen is nondistended, soft and nontender. No organomegaly or masses felt. Normal bowel sounds heard. Central nervous system: Alert and oriented to person, place and partly to time . No focal neurological deficits. Extremities: Symmetric 5 x 5 power. Skin: dressing over  left upper anterior lateral chest wall at I&D site, clean and dry.  Psychiatry: Judgement and insight appear normal. Mood & affect appropriate.     Data Reviewed: I have personally reviewed following labs and imaging studies  CBC:  Recent Labs Lab 08/29/16 1837 08/30/16 0155 08/31/16 0616 09/01/16 0006  WBC 22.7* 22.6* 14.6* 14.0*  HGB 9.6* 9.6* 9.8* 9.2*  HCT 29.2* 29.0* 30.4* 28.7*  MCV 82.0 82.2 82.8 83.2  PLT 464* 502* 598* [id number]*   Basic Metabolic Panel:  Recent Labs Lab 08/29/16 1837 08/30/16 0155 08/30/16 1518 08/31/16 0616 09/01/16 0006  NA 129* 129*  --  139 138  K 3.0* 2.8*  --  3.8 3.4*  CL 94* 94*  --  106 108  CO2 22 25  --  22 21*  GLUCOSE 130* 150*  --  130* 124*  BUN 35* 34*  --  18 12  CREATININE 1.68* 1.61*  --  1.05* 0.93  CALCIUM 9.5 9.3  --  9.7 9.4  MG  --   --  1.9  --   --    GFR: Estimated Creatinine Clearance: 63.2 mL/min (by C-G formula based on SCr of 0.93 mg/dL). Liver Function Tests:  Recent Labs Lab 08/29/16 1837  AST 29  ALT 22  ALKPHOS 71  BILITOT 0.4  PROT 5.7*  ALBUMIN 2.3*   No results for input(s): LIPASE, AMYLASE in the last 168 hours. No results for  input(s): AMMONIA in the last 168 hours. Coagulation Profile: No results for input(s): INR, PROTIME in the last 168 hours. Cardiac Enzymes: No results for input(s): CKTOTAL, CKMB, CKMBINDEX, TROPONINI in the last 168 hours. BNP (last 3 results) No results for input(s): PROBNP in the last 8760 hours. HbA1C: No results for input(s): HGBA1C in the last 72 hours. CBG: No results for input(s): GLUCAP in the last 168 hours. Lipid Profile: No results for input(s): CHOL, HDL, LDLCALC, TRIG, CHOLHDL, LDLDIRECT in the last 72 hours. Thyroid Function Tests:  Recent Labs  08/30/16 1518  TSH 2.062   Anemia Panel: No results for input(s): VITAMINB12, FOLATE, FERRITIN, TIBC, IRON, RETICCTPCT in the last 72 hours.  Sepsis Labs: No results for input(s): PROCALCITON,  LATICACIDVEN in the last 168 hours.  No results found for this or any previous visit (from the past 240 hour(s)).       Radiology Studies: No results found.      Scheduled Meds: . apixaban  5 mg Oral BID  . buPROPion  150 mg Oral q morning - 10a  .  ceFAZolin (ANCEF) IV  2 g Intravenous Q8H  . clonazePAM  2 mg Oral BID  . feeding supplement (ENSURE ENLIVE)  237 mL Oral BID BM  . metoprolol tartrate  25 mg Oral BID  . metroNIDAZOLE  500 mg Oral Q8H  . multivitamin with minerals  1 tablet Oral Daily  . potassium chloride  40 mEq Oral Once   Continuous Infusions:    LOS: 2 days       Phillips Climes, MD [facility] Pager [phone]  If 7PM-7AM, please contact night-coverage [email or web address] Password [email or web address] 09/01/2016, 2:31 PM

## 2016-09-02 LAB — BASIC METABOLIC PANEL
ANION GAP: 8 (ref 5–15)
BUN: 7 mg/dL (ref 6–20)
CO2: 24 mmol/L (ref 22–32)
Calcium: 9.1 mg/dL (ref 8.9–10.3)
Chloride: 107 mmol/L (ref 101–111)
Creatinine, Ser: 0.79 mg/dL (ref 0.44–1.00)
GFR calc Af Amer: >60 mL/min (ref >60)
Glucose, Bld: 118 mg/dL — ABNORMAL HIGH (ref 65–99)
POTASSIUM: 3.6 mmol/L (ref 3.5–5.1)
SODIUM: 139 mmol/L (ref 135–145)

## 2016-09-02 LAB — CBC
HEMATOCRIT: 26.7 % — AB (ref 36.0–46.0)
HEMOGLOBIN: 8.6 g/dL — AB (ref 12.0–15.0)
MCH: 27 pg (ref 26.0–34.0)
MCHC: 32.2 g/dL (ref 30.0–36.0)
MCV: 83.7 fL (ref 78.0–100.0)
Platelets: 482 10*3/uL — ABNORMAL HIGH (ref 150–400)
RBC: 3.19 MIL/uL — AB (ref 3.87–5.11)
RDW: 15.8 % — ABNORMAL HIGH (ref 11.5–15.5)
WBC: 14.7 10*3/uL — AB (ref 4.0–10.5)

## 2016-09-02 MED ORDER — AMOXICILLIN-POT CLAVULANATE 875-125 MG PO TABS: 1.0000 | ORAL_TABLET | Freq: Two times a day (BID) | ORAL | 1 refills | Status: DC

## 2016-09-02 MED ORDER — APIXABAN 5 MG PO TABS: 5.0000 mg | ORAL_TABLET | Freq: Two times a day (BID) | ORAL | 2 refills | Status: DC

## 2016-09-02 NOTE — Progress Notes (Signed)
PROGRESS NOTE  Debra Griffith  [id number] DOB: 10/25/1940  DOA: 08/29/2016 PCP: Henrine Screws, MD  Outpatient Specialists:  Oncology: Dr. Jana Hakim  Brief Narrative:  75 y.o. female with PMH of left breast cancer status post lumpectomy followed by reduction which was complicated by necrosis leading to eventual mastectomy that left her with an open wound that is being followed by surgery in the office, HTN, rheumatoid arthritis, morbid obesity, macular degeneration, anxiety & depression, presented to general surgeons office with complaints of increased drainage from left breast site wound, generalized weakness, decreased appetite, falls and confusion. She underwent I&D of the left breast site wound in the office and was admitted to [facility] for further evaluation and management. She was noted to have dehydration, hypotension, acute kidney injury. On 12/14, noted new onset A. fib.   Assessment & Plan:   Principal Problem:   Cellulitis of left breast Active Problems:   Essential hypertension   Dehydration   Hypotension   Confusion   Cellulitis   New onset atrial fibrillation ([place])   1. Cellulitis/abscess of left mastectomy site wound: Status post I&D in office by general surgery on 08/29/16 and admitted to [facility] for IV antibiotics. Continue empirically started IV Ancef and oral Flagyl. Plastic surgery input appreciated. Patient continued to gradually improve. Plastic surgery seen today and have arranged home health for dressing changes and will arrange outpatient follow-up. Cardiology have seen and discharging her on oral Augmentin. 2. Hypotension: Likely secondary to dehydration and antihypertensives. Treated with IV fluids, held some of her antihypertensives. Currently blood pressures are stable on metoprolol alone. Discontinued triamterene-HCTZ, amlodipine and valsartan.  3. Dehydration with hyponatremia: Secondary to poor oral intake and diuretics. IV fluids completed. Resolved. DC HCTZ  indefinitely. 4. Confusion: May be multifactorial secondary to dehydration, hypotension, infection, pain medications, acute kidney injury. Treated underlying cause as above. Minimize narcotic pain medications. No syncope reported. CT head without acute findings. Seems better but fluctuating at times. As per family's report, they feel that she may be do better at home from her confusion standpoint which is reasonable. Also surgeons are discharging her without narcotics which is also appropriate. Close outpatient follow-up.? Underlying element of dementia. 5. Mechanical falls at home: May be related to acute medical illness listed above. Treat as above. Patient will be going home on home health PT, OT. 6. Essential hypertension: Management as per problem number 2. Continue metoprolol and discontinued other antihypertensive that she was on prior to admission. 7. Acute on Stage III chronic kidney disease: Baseline creatinine may be in the 1.3 range. Presented with creatinine of 1.68. Held diuretics, ARB, treated with gentle IV fluids. Acute kidney injury resolved.  8. Anemia: Last hemoglobin 9.1 on 08/03/16. Stable. Hemoglobin had been stable in the 9 g range. Today it is 8.6. No overt bleeding. Likely related to chronic disease. Follow with PCP in a few days with repeat labs (CBC & BMP). 9. Anxiety and depression:  continue home dose of clonazepam and Wellbutrin. 10. Left breast cancer status post mastectomy: Arimidex on hold due to acute infection. Outpatient follow-up with oncology. Supposed to start radiation treatment when surgical issues are resolved. Noncontrast CT head 08/29/16 reports tiny calvarial lucencies which are likely benign though given patient's history of cancer metastasis not excluded-outpatient follow-up with oncology.  11. A. fib, new diagnosis/RBBB: Patient asymptomatic. TSH normal. 2-D echo with normal EF. CHA2DS2-VASc Score: 3. Cardiology was consulted. Home dose of metoprolol was  continued with reasonable ventricular rate controlled. Eliquis was newly  started after coordinating with general surgery. Aspirin 81 MG was discontinued due to increased bleeding risk while on Eliquis. Cardiology will arrange outpatient follow-up.  12. Leukocytosis: Likely secondary to problem #1. Follow CBCs. Improving. 13. Hypokalemia: Replaced. Magnesium 1.9.   DVT prophylaxis: Lovenox Code Status: Full Family Communication:  None at bedside this morning. Disposition Plan: As per primary service, discharge planned for today.  Consultants:   [facility] are consultants.   Cardiology-signed off  Procedures:   None  Antimicrobials:   IV Unasyn 12/13>  PO Flagyl 12/13>    Subjective: Seen this morning. Still has mild confusion but denied any complaints. No dyspnea, chest pain or pain elsewhere. As per RN, apart from ongoing confusion no acute symptoms reported. As per RN's discussion with patient's family on 12/16, they felt that she would be better served at home in terms of her confusion improvement.  Objective:  Vitals:   09/01/16 1400 09/01/16 2118 09/01/16 2120 09/02/16 0542  BP: 118/72 123/65 123/65 120/75  Pulse: 100 (!) 107 (!) 107 (!) 104  Resp: 18  18 18   Temp: 98 F (36.7 C)  97.7 F (36.5 C) 98.2 F (36.8 C)  TempSrc: Oral  Oral Oral  SpO2: 99%  100% 100%  Weight:      Height:        Intake/Output Summary (Last 24 hours) at 09/02/16 1400 Last data filed at 09/02/16 [id number]  Gross per 24 hour  Intake              680 ml  Output              750 ml  Net              -70 ml   Filed Weights   08/30/16 1707  Weight: 120.2 kg (265 lb)    Examination:  General exam: Moderately built and obese female sitting up comfortably in the chair seen taking a nap this morning. Respiratory system: Clear to auscultation. Respiratory effort normal. Cardiovascular system: S1 & S2 heard, irregularly irregular. No JVD, murmurs, rubs, gallops or clicks. Trace bilateral ankle  edema.Telemetry: A. fib with ventricular rate mostly in the 90s and occasionally in the 100s. Gastrointestinal system: Abdomen is nondistended, soft and nontender. No organomegaly or masses felt. Normal bowel sounds heard. Central nervous system: Alert and oriented to person, place and partly to time . No focal neurological deficits. Extremities: Symmetric 5 x 5 power. Skin: dressing over left upper anterior lateral chest wall at I&D site, clean and dry.  Psychiatry: Judgement and insight appear normal. Mood & affect appropriate.     Data Reviewed: I have personally reviewed following labs and imaging studies  CBC:  Recent Labs Lab 08/29/16 1837 08/30/16 0155 08/31/16 0616 09/01/16 0006 09/02/16 0351  WBC 22.7* 22.6* 14.6* 14.0* 14.7*  HGB 9.6* 9.6* 9.8* 9.2* 8.6*  HCT 29.2* 29.0* 30.4* 28.7* 26.7*  MCV 82.0 82.2 82.8 83.2 83.7  PLT 464* 502* 598* 505* [id number]*   Basic Metabolic Panel:  Recent Labs Lab 08/29/16 1837 08/30/16 0155 08/30/16 1518 08/31/16 0616 09/01/16 0006 09/02/16 0351  NA 129* 129*  --  139 138 139  K 3.0* 2.8*  --  3.8 3.4* 3.6  CL 94* 94*  --  106 108 107  CO2 22 25  --  22 21* 24  GLUCOSE 130* 150*  --  130* 124* 118*  BUN 35* 34*  --  18 12 7   CREATININE 1.68* 1.61*  --  1.05*  0.93 0.79  CALCIUM 9.5 9.3  --  9.7 9.4 9.1  MG  --   --  1.9  --   --   --    Liver Function Tests:  Recent Labs Lab 08/29/16 1837  AST 29  ALT 22  ALKPHOS 71  BILITOT 0.4  PROT 5.7*  ALBUMIN 2.3*   Thyroid Function Tests:  Recent Labs  08/30/16 1518  TSH 2.062        Radiology Studies: No results found.      Scheduled Meds: . apixaban  5 mg Oral BID  . buPROPion  150 mg Oral q morning - 10a  .  ceFAZolin (ANCEF) IV  2 g Intravenous Q8H  . clonazePAM  2 mg Oral BID  . feeding supplement (ENSURE ENLIVE)  237 mL Oral BID BM  . metoprolol tartrate  25 mg Oral BID  . metroNIDAZOLE  500 mg Oral Q8H  . multivitamin with minerals  1 tablet Oral  Daily   Continuous Infusions:    LOS: 3 days       Deklynn Charlet, MD [facility] Pager [phone]  If 7PM-7AM, please contact night-coverage [email or web address] Password [email or web address] 09/02/2016, 2:00 PM

## 2016-09-02 NOTE — Progress Notes (Signed)
   Plastic Surgery   HD# 5 left chest seroma/cellulitis s/p spontaneous drainage, open wound. New onset afib  Temp:  [97.7 F (36.5 C)-98.2 F (36.8 C)] 98.2 F (36.8 C) (12/17 0542) Pulse Rate:  [100-107] 104 (12/17 0542) Resp:  [18] 18 (12/17 0542) BP: (118-123)/(65-75) 120/75 (12/17 0542) SpO2:  [99 %-100 %] 100 % (12/17 0542)     PE Alert and oriented, needs some redirection All family at bedside  exam deferred as changed this am   A/P  Home today Once visit with Center arranged, family will let me know schedule and I will fax new orders for collagen/silver alginate and will arrange follow up appt with myself.  Irene Limbo, MD [facility] Plastic & Reconstructive Surgery [phone], pin [phone]

## 2016-09-02 NOTE — Progress Notes (Signed)
RN requested a pharmacist to come do Eliquis teaching with patient. RN spoke with pharmacist who stated, " I did that on Friday."

## 2016-09-02 NOTE — Progress Notes (Signed)
Discharge paperwork reviewed with patient's family. Eliquis card given. Prescription given. Patient is ready for discharge.

## 2016-09-02 NOTE — Care Management Note (Signed)
Case Management Note  Patient Details  Name: Debra Griffith MRN: [id number] Date of Birth: 12-27-1940               Action/Plan: Discharge Planning: AVS reviewed Please see previous NCM notes. Provided pt with Eliquis 30 free trial card. Unit RN will explain to pt's husband and dtr when they arrive.   Expected Discharge Date:              Expected Discharge Plan:  [place]  In-House Referral:     Discharge planning Services  CM Consult  Post Acute Care Choice:  Home Health Choice offered to:  Patient, Spouse, Adult Children  HH Arranged:  RN, PT, OT, Nurse's Aide [place] Agency:  [place]  Status of Service:  Completed, signed off  If discussed at [place] of Stay Meetings, dates discussed:    Additional Comments:  Erenest Rasher, RN 09/02/2016, 10:43 AM

## 2016-09-02 NOTE — Progress Notes (Signed)
RN spoke with CM. Per CM Aleesia, she will bring the patient a coupon for Eliquis.

## 2016-09-02 NOTE — Discharge Summary (Signed)
Patient ID: Debra Griffith [id number] 75 y.o. [birth year]  Admit date: 08/29/2016  Discharge date and time: 09/02/2016  Admitting Physician: Rolm Bookbinder  Discharge Physician: Adin Hector  Admission Diagnoses: left mastectomy wound infection  Discharge Diagnoses: Left mastectomy wound infection                                         Confusional state.  Chronic.  Stable.                                         atrial fibrillation--new diagnosis on admission.  Started on eloquence by cardiology.  ASA 81 mg discontinued                                         Hypotension.  Resolved following adjustment of antihypertensive medication                                         Dehydration with hyponatremia.-  HCTZ  discontinued indefinitely                                         Fall risk.  Home health PT and OT requested                                         Hypertension.  Essential.  HCTZ, triamterene, amlodipine, valsartan discontinued                                         CKD III                                         Anemia.  Chronic.  Stable                                         Anxiety and depression  Operations: none  Admission Condition: fair  Discharged Condition: good  Indication for Admission: 75 y.o.femalewith PMH of left breast cancer status post lumpectomy followed by reduction which was complicated by necrosis leading to eventual mastectomy that left her with an open wound that is being followed by surgery in the office, HTN, rheumatoid arthritis, morbid obesity, macular degeneration, anxiety & depression, presented to general surgeons office with complaints of increased drainage from left breast site wound, generalized weakness, decreased appetite, falls and confusion. She underwent I&D of the left breast site wound in the office and was admitted to [facility] for further evaluation and management. She was noted to have dehydration, hypotension, acute kidney  injury. On 12/14, noted new onset A. fib.  Hospital Course: The patient was  admitted by Dr. Donne Hazel for intravenous antibiotics and wound care.     She was seen in consultation by Dr. Aundria Rud revised regarding wound management.     She was seen in consultation by Dr. Exie Parody of the [facility] hospitalist who assisted with management of her multiple medical problems.      She was seen in consultation by Dr. Tana Conch of the cardiology service     Because of her confusional state, noncontrast head CT performed on 08/29/2016 showed tiny calvarial lucencies which are likely benign, although given the patient's history of cancer metastasis is not excluded, outpatient follow-up with oncology recommended.     Cardiology elected to start her on eloquence and discontinue the aspirin because of her atrial fibrillation.  Metoprolol will be continued.  No indication for urgent cardioversion.  Normal EF noted.  Hypokalemia treated.  Cardiology felt that she was stable and ready for discharge from cardiology standpoint on December 15.      Dr. Iran Planas arranged  for twice daily.Dakin's solution dressing changes and packing to the 2 wounds on the anterior chest wall.  The cellulitis resolved promptly.  Her IV antibiotics were converted to oral Augmentin at the time of discharge.        She was seen by Dr. Roseanne Kaufman of [facility] hospitalist internal medical's in service on December 16.  He felt she was medically stable for discharge.  Narcotics are held at discharge due to collar chronic confusional state.  Anemia with most recent hemoglobin 8.6 felt to be appropriate and stable.  Does not require transfusion.  Diuretics held because of CK D3, hypertension on admission.  Triamterene, amlodipine and Valsartin also held.        Home health nursing, home health PT and OT were requested case management is working on this.        at the time of discharge the patient was alert and ambulating very well.  Confusional state was  minimal.  She was pleasant.  Tolerating diet.  Examination of her to chest wall wounds revealed that they were packed open with minimal purulence.  No further skin necrosis.  No odor.  Erythema had resolved.     She was discharged.  At discharge she was advised to discontinue narcotics.  She was given prescriptions for Augmentin and eliquis.  She was advised to follow-up with Dr. Donne Hazel in one week.  Dr. Iran Planas is going to call the patient for an appointment within 5 days.  She is advised to follow-up with cardiology as well as primary care physician to help manage her multiple medications and multiple medical problems.       Consults: Cardiology, internal medicine, plastic surgery, case management, physical therapy, occupational therapy  Significant Diagnostic Studies:   Treatments: Frequent wound care and dressing.  IV antibiotics.    Disposition: Home  Patient Instructions:  Allergies as of 09/02/2016      Reactions   Morphine And Related Other (See Comments)   hallucinations      Medication List    STOP taking these medications   amLODipine 10 MG tablet Commonly known as:  NORVASC   HYDROcodone-acetaminophen 10-325 MG tablet Commonly known as:  NORCO   oxyCODONE 5 MG immediate release tablet Commonly known as:  Oxy IR/ROXICODONE   triamterene-hydrochlorothiazide 37.5-25 MG tablet Commonly known as:  MAXZIDE-25   valsartan 320 MG tablet Commonly known as:  DIOVAN   zolpidem 10 MG tablet Commonly known as:  AMBIEN     TAKE these  medications   amoxicillin-clavulanate 875-125 MG tablet Commonly known as:  AUGMENTIN Take 1 tablet by mouth 2 (two) times daily.   anastrozole 1 MG tablet Commonly known as:  ARIMIDEX Take 1 tablet (1 mg total) by mouth daily.   apixaban 5 MG Tabs tablet Commonly known as:  ELIQUIS Take 1 tablet (5 mg total) by mouth 2 (two) times daily.   B-12 500 MCG Tabs Take 500 mcg by mouth daily.   buPROPion 150 MG 12 hr tablet Commonly  known as:  WELLBUTRIN SR Take 150 mg by mouth every morning.   cholecalciferol 1000 units tablet Commonly known as:  VITAMIN D Take 1,000 Units by mouth daily.   clonazePAM 1 MG tablet Commonly known as:  KLONOPIN Take 2 mg by mouth 2 (two) times daily.   Fish Oil 1000 MG Caps Take 1,000 mg by mouth daily.   MAGNESIUM PO Take 2 tablets by mouth daily.   metoprolol tartrate 25 MG tablet Commonly known as:  LOPRESSOR Take 25 mg by mouth 2 (two) times daily.   TART CHERRY ADVANCED PO Take 1 capsule by mouth daily.   Turmeric 500 MG Caps Take 500 mg by mouth daily.   vitamin C 500 MG tablet Commonly known as:  ASCORBIC ACID Take 1,000 mg by mouth daily.       Activity: Ambulate frequently with assistance.  No sports or heavy lifting.  No driving. Diet: low fat, low cholesterol diet Wound Care: as directed  Follow-up:  WithDr. Donne Hazel  in 1 week.  Signed: Edsel Petrin. Dalbert Batman, M.D., FACS General and minimally invasive surgery Breast and Colorectal Surgery  09/02/2016, 9:27 AM

## 2016-09-03 DIAGNOSIS — D649 Anemia, unspecified: Secondary | ICD-10-CM | POA: Diagnosis not present

## 2016-09-03 DIAGNOSIS — N183 Chronic kidney disease, stage 3 (moderate): Secondary | ICD-10-CM | POA: Diagnosis not present

## 2016-09-03 DIAGNOSIS — F419 Anxiety disorder, unspecified: Secondary | ICD-10-CM | POA: Diagnosis not present

## 2016-09-03 DIAGNOSIS — I129 Hypertensive chronic kidney disease with stage 1 through stage 4 chronic kidney disease, or unspecified chronic kidney disease: Secondary | ICD-10-CM | POA: Diagnosis not present

## 2016-09-03 DIAGNOSIS — F329 Major depressive disorder, single episode, unspecified: Secondary | ICD-10-CM | POA: Diagnosis not present

## 2016-09-03 DIAGNOSIS — M069 Rheumatoid arthritis, unspecified: Secondary | ICD-10-CM | POA: Diagnosis not present

## 2016-09-03 DIAGNOSIS — T8189XD Other complications of procedures, not elsewhere classified, subsequent encounter: Secondary | ICD-10-CM | POA: Diagnosis not present

## 2016-09-03 DIAGNOSIS — I4891 Unspecified atrial fibrillation: Secondary | ICD-10-CM | POA: Diagnosis not present

## 2016-09-03 DIAGNOSIS — Z9181 History of falling: Secondary | ICD-10-CM | POA: Diagnosis not present

## 2016-09-04 DIAGNOSIS — N183 Chronic kidney disease, stage 3 (moderate): Secondary | ICD-10-CM | POA: Diagnosis not present

## 2016-09-04 DIAGNOSIS — D649 Anemia, unspecified: Secondary | ICD-10-CM | POA: Diagnosis not present

## 2016-09-04 DIAGNOSIS — I4891 Unspecified atrial fibrillation: Secondary | ICD-10-CM | POA: Diagnosis not present

## 2016-09-04 DIAGNOSIS — I129 Hypertensive chronic kidney disease with stage 1 through stage 4 chronic kidney disease, or unspecified chronic kidney disease: Secondary | ICD-10-CM | POA: Diagnosis not present

## 2016-09-04 DIAGNOSIS — T8189XD Other complications of procedures, not elsewhere classified, subsequent encounter: Secondary | ICD-10-CM | POA: Diagnosis not present

## 2016-09-04 DIAGNOSIS — M069 Rheumatoid arthritis, unspecified: Secondary | ICD-10-CM | POA: Diagnosis not present

## 2016-09-05 DIAGNOSIS — I4891 Unspecified atrial fibrillation: Secondary | ICD-10-CM | POA: Diagnosis not present

## 2016-09-05 DIAGNOSIS — N183 Chronic kidney disease, stage 3 (moderate): Secondary | ICD-10-CM | POA: Diagnosis not present

## 2016-09-05 DIAGNOSIS — M1990 Unspecified osteoarthritis, unspecified site: Secondary | ICD-10-CM | POA: Diagnosis not present

## 2016-09-05 DIAGNOSIS — G4700 Insomnia, unspecified: Secondary | ICD-10-CM | POA: Diagnosis not present

## 2016-09-05 DIAGNOSIS — E559 Vitamin D deficiency, unspecified: Secondary | ICD-10-CM | POA: Diagnosis not present

## 2016-09-05 DIAGNOSIS — F419 Anxiety disorder, unspecified: Secondary | ICD-10-CM | POA: Diagnosis not present

## 2016-09-05 DIAGNOSIS — F322 Major depressive disorder, single episode, severe without psychotic features: Secondary | ICD-10-CM | POA: Diagnosis not present

## 2016-09-05 DIAGNOSIS — I129 Hypertensive chronic kidney disease with stage 1 through stage 4 chronic kidney disease, or unspecified chronic kidney disease: Secondary | ICD-10-CM | POA: Diagnosis not present

## 2016-09-05 DIAGNOSIS — T8189XD Other complications of procedures, not elsewhere classified, subsequent encounter: Secondary | ICD-10-CM | POA: Diagnosis not present

## 2016-09-05 DIAGNOSIS — G894 Chronic pain syndrome: Secondary | ICD-10-CM | POA: Diagnosis not present

## 2016-09-05 DIAGNOSIS — N611 Abscess of the breast and nipple: Secondary | ICD-10-CM | POA: Diagnosis not present

## 2016-09-05 DIAGNOSIS — C50919 Malignant neoplasm of unspecified site of unspecified female breast: Secondary | ICD-10-CM | POA: Diagnosis not present

## 2016-09-05 DIAGNOSIS — M069 Rheumatoid arthritis, unspecified: Secondary | ICD-10-CM | POA: Diagnosis not present

## 2016-09-05 DIAGNOSIS — D649 Anemia, unspecified: Secondary | ICD-10-CM | POA: Diagnosis not present

## 2016-09-05 DIAGNOSIS — I10 Essential (primary) hypertension: Secondary | ICD-10-CM | POA: Diagnosis not present

## 2016-09-05 NOTE — Progress Notes (Addendum)
error 

## 2016-09-06 DIAGNOSIS — I4891 Unspecified atrial fibrillation: Secondary | ICD-10-CM | POA: Diagnosis not present

## 2016-09-06 DIAGNOSIS — D649 Anemia, unspecified: Secondary | ICD-10-CM | POA: Diagnosis not present

## 2016-09-06 DIAGNOSIS — T8189XD Other complications of procedures, not elsewhere classified, subsequent encounter: Secondary | ICD-10-CM | POA: Diagnosis not present

## 2016-09-06 DIAGNOSIS — M069 Rheumatoid arthritis, unspecified: Secondary | ICD-10-CM | POA: Diagnosis not present

## 2016-09-06 DIAGNOSIS — I129 Hypertensive chronic kidney disease with stage 1 through stage 4 chronic kidney disease, or unspecified chronic kidney disease: Secondary | ICD-10-CM | POA: Diagnosis not present

## 2016-09-06 DIAGNOSIS — N183 Chronic kidney disease, stage 3 (moderate): Secondary | ICD-10-CM | POA: Diagnosis not present

## 2016-09-07 ENCOUNTER — Ambulatory Visit: Payer: Medicare Other | Admitting: Radiation Oncology

## 2016-09-07 ENCOUNTER — Ambulatory Visit
Admission: RE | Admit: 2016-09-07 | Discharge: 2016-09-07 | Disposition: A | Discharge status: Home / Self Care | Payer: Medicare Other | Source: Ambulatory Visit | Attending: Radiation Oncology | Admitting: Radiation Oncology

## 2016-09-07 ENCOUNTER — Ambulatory Visit: Payer: Medicare Other

## 2016-09-11 DIAGNOSIS — D649 Anemia, unspecified: Secondary | ICD-10-CM | POA: Diagnosis not present

## 2016-09-11 DIAGNOSIS — M069 Rheumatoid arthritis, unspecified: Secondary | ICD-10-CM | POA: Diagnosis not present

## 2016-09-11 DIAGNOSIS — N183 Chronic kidney disease, stage 3 (moderate): Secondary | ICD-10-CM | POA: Diagnosis not present

## 2016-09-11 DIAGNOSIS — I4891 Unspecified atrial fibrillation: Secondary | ICD-10-CM | POA: Diagnosis not present

## 2016-09-11 DIAGNOSIS — T8189XD Other complications of procedures, not elsewhere classified, subsequent encounter: Secondary | ICD-10-CM | POA: Diagnosis not present

## 2016-09-11 DIAGNOSIS — I129 Hypertensive chronic kidney disease with stage 1 through stage 4 chronic kidney disease, or unspecified chronic kidney disease: Secondary | ICD-10-CM | POA: Diagnosis not present

## 2016-09-12 DIAGNOSIS — N183 Chronic kidney disease, stage 3 (moderate): Secondary | ICD-10-CM | POA: Diagnosis not present

## 2016-09-12 DIAGNOSIS — D649 Anemia, unspecified: Secondary | ICD-10-CM | POA: Diagnosis not present

## 2016-09-12 DIAGNOSIS — I4891 Unspecified atrial fibrillation: Secondary | ICD-10-CM | POA: Diagnosis not present

## 2016-09-12 DIAGNOSIS — I129 Hypertensive chronic kidney disease with stage 1 through stage 4 chronic kidney disease, or unspecified chronic kidney disease: Secondary | ICD-10-CM | POA: Diagnosis not present

## 2016-09-12 DIAGNOSIS — T8189XD Other complications of procedures, not elsewhere classified, subsequent encounter: Secondary | ICD-10-CM | POA: Diagnosis not present

## 2016-09-12 DIAGNOSIS — M069 Rheumatoid arthritis, unspecified: Secondary | ICD-10-CM | POA: Diagnosis not present

## 2016-09-13 DIAGNOSIS — T8189XD Other complications of procedures, not elsewhere classified, subsequent encounter: Secondary | ICD-10-CM | POA: Diagnosis not present

## 2016-09-13 DIAGNOSIS — D649 Anemia, unspecified: Secondary | ICD-10-CM | POA: Diagnosis not present

## 2016-09-13 DIAGNOSIS — N183 Chronic kidney disease, stage 3 (moderate): Secondary | ICD-10-CM | POA: Diagnosis not present

## 2016-09-13 DIAGNOSIS — M069 Rheumatoid arthritis, unspecified: Secondary | ICD-10-CM | POA: Diagnosis not present

## 2016-09-13 DIAGNOSIS — I4891 Unspecified atrial fibrillation: Secondary | ICD-10-CM | POA: Diagnosis not present

## 2016-09-13 DIAGNOSIS — I129 Hypertensive chronic kidney disease with stage 1 through stage 4 chronic kidney disease, or unspecified chronic kidney disease: Secondary | ICD-10-CM | POA: Diagnosis not present

## 2016-09-14 DIAGNOSIS — N183 Chronic kidney disease, stage 3 (moderate): Secondary | ICD-10-CM | POA: Diagnosis not present

## 2016-09-14 DIAGNOSIS — D649 Anemia, unspecified: Secondary | ICD-10-CM | POA: Diagnosis not present

## 2016-09-14 DIAGNOSIS — M069 Rheumatoid arthritis, unspecified: Secondary | ICD-10-CM | POA: Diagnosis not present

## 2016-09-14 DIAGNOSIS — I4891 Unspecified atrial fibrillation: Secondary | ICD-10-CM | POA: Diagnosis not present

## 2016-09-14 DIAGNOSIS — T8189XD Other complications of procedures, not elsewhere classified, subsequent encounter: Secondary | ICD-10-CM | POA: Diagnosis not present

## 2016-09-14 DIAGNOSIS — I129 Hypertensive chronic kidney disease with stage 1 through stage 4 chronic kidney disease, or unspecified chronic kidney disease: Secondary | ICD-10-CM | POA: Diagnosis not present

## 2016-09-16 ENCOUNTER — Other Ambulatory Visit: Payer: Self-pay | Admitting: Oncology

## 2016-09-17 DIAGNOSIS — M069 Rheumatoid arthritis, unspecified: Secondary | ICD-10-CM | POA: Diagnosis not present

## 2016-09-17 DIAGNOSIS — D649 Anemia, unspecified: Secondary | ICD-10-CM | POA: Diagnosis not present

## 2016-09-17 DIAGNOSIS — N183 Chronic kidney disease, stage 3 (moderate): Secondary | ICD-10-CM | POA: Diagnosis not present

## 2016-09-17 DIAGNOSIS — I4891 Unspecified atrial fibrillation: Secondary | ICD-10-CM | POA: Diagnosis not present

## 2016-09-17 DIAGNOSIS — I129 Hypertensive chronic kidney disease with stage 1 through stage 4 chronic kidney disease, or unspecified chronic kidney disease: Secondary | ICD-10-CM | POA: Diagnosis not present

## 2016-09-17 DIAGNOSIS — T8189XD Other complications of procedures, not elsewhere classified, subsequent encounter: Secondary | ICD-10-CM | POA: Diagnosis not present

## 2016-09-18 DIAGNOSIS — N183 Chronic kidney disease, stage 3 (moderate): Secondary | ICD-10-CM | POA: Diagnosis not present

## 2016-09-18 DIAGNOSIS — I4891 Unspecified atrial fibrillation: Secondary | ICD-10-CM | POA: Diagnosis not present

## 2016-09-18 DIAGNOSIS — M069 Rheumatoid arthritis, unspecified: Secondary | ICD-10-CM | POA: Diagnosis not present

## 2016-09-18 DIAGNOSIS — I129 Hypertensive chronic kidney disease with stage 1 through stage 4 chronic kidney disease, or unspecified chronic kidney disease: Secondary | ICD-10-CM | POA: Diagnosis not present

## 2016-09-18 DIAGNOSIS — D649 Anemia, unspecified: Secondary | ICD-10-CM | POA: Diagnosis not present

## 2016-09-18 DIAGNOSIS — T8189XD Other complications of procedures, not elsewhere classified, subsequent encounter: Secondary | ICD-10-CM | POA: Diagnosis not present

## 2016-09-19 DIAGNOSIS — M069 Rheumatoid arthritis, unspecified: Secondary | ICD-10-CM | POA: Diagnosis not present

## 2016-09-19 DIAGNOSIS — T8189XD Other complications of procedures, not elsewhere classified, subsequent encounter: Secondary | ICD-10-CM | POA: Diagnosis not present

## 2016-09-19 DIAGNOSIS — I4891 Unspecified atrial fibrillation: Secondary | ICD-10-CM | POA: Diagnosis not present

## 2016-09-19 DIAGNOSIS — N183 Chronic kidney disease, stage 3 (moderate): Secondary | ICD-10-CM | POA: Diagnosis not present

## 2016-09-19 DIAGNOSIS — I129 Hypertensive chronic kidney disease with stage 1 through stage 4 chronic kidney disease, or unspecified chronic kidney disease: Secondary | ICD-10-CM | POA: Diagnosis not present

## 2016-09-19 DIAGNOSIS — D649 Anemia, unspecified: Secondary | ICD-10-CM | POA: Diagnosis not present

## 2016-09-20 DIAGNOSIS — M069 Rheumatoid arthritis, unspecified: Secondary | ICD-10-CM | POA: Diagnosis not present

## 2016-09-20 DIAGNOSIS — T8189XD Other complications of procedures, not elsewhere classified, subsequent encounter: Secondary | ICD-10-CM | POA: Diagnosis not present

## 2016-09-20 DIAGNOSIS — D649 Anemia, unspecified: Secondary | ICD-10-CM | POA: Diagnosis not present

## 2016-09-20 DIAGNOSIS — I4891 Unspecified atrial fibrillation: Secondary | ICD-10-CM | POA: Diagnosis not present

## 2016-09-20 DIAGNOSIS — I129 Hypertensive chronic kidney disease with stage 1 through stage 4 chronic kidney disease, or unspecified chronic kidney disease: Secondary | ICD-10-CM | POA: Diagnosis not present

## 2016-09-20 DIAGNOSIS — N183 Chronic kidney disease, stage 3 (moderate): Secondary | ICD-10-CM | POA: Diagnosis not present

## 2016-09-24 DIAGNOSIS — D649 Anemia, unspecified: Secondary | ICD-10-CM | POA: Diagnosis not present

## 2016-09-24 DIAGNOSIS — M069 Rheumatoid arthritis, unspecified: Secondary | ICD-10-CM | POA: Diagnosis not present

## 2016-09-24 DIAGNOSIS — T8189XD Other complications of procedures, not elsewhere classified, subsequent encounter: Secondary | ICD-10-CM | POA: Diagnosis not present

## 2016-09-24 DIAGNOSIS — N183 Chronic kidney disease, stage 3 (moderate): Secondary | ICD-10-CM | POA: Diagnosis not present

## 2016-09-24 DIAGNOSIS — I129 Hypertensive chronic kidney disease with stage 1 through stage 4 chronic kidney disease, or unspecified chronic kidney disease: Secondary | ICD-10-CM | POA: Diagnosis not present

## 2016-09-24 DIAGNOSIS — I4891 Unspecified atrial fibrillation: Secondary | ICD-10-CM | POA: Diagnosis not present

## 2016-09-25 ENCOUNTER — Telehealth: Payer: Self-pay | Admitting: Oncology

## 2016-09-25 DIAGNOSIS — I129 Hypertensive chronic kidney disease with stage 1 through stage 4 chronic kidney disease, or unspecified chronic kidney disease: Secondary | ICD-10-CM | POA: Diagnosis not present

## 2016-09-25 DIAGNOSIS — N183 Chronic kidney disease, stage 3 (moderate): Secondary | ICD-10-CM | POA: Diagnosis not present

## 2016-09-25 DIAGNOSIS — M069 Rheumatoid arthritis, unspecified: Secondary | ICD-10-CM | POA: Diagnosis not present

## 2016-09-25 DIAGNOSIS — D649 Anemia, unspecified: Secondary | ICD-10-CM | POA: Diagnosis not present

## 2016-09-25 DIAGNOSIS — I4891 Unspecified atrial fibrillation: Secondary | ICD-10-CM | POA: Diagnosis not present

## 2016-09-25 DIAGNOSIS — T8189XD Other complications of procedures, not elsewhere classified, subsequent encounter: Secondary | ICD-10-CM | POA: Diagnosis not present

## 2016-09-25 NOTE — Telephone Encounter (Signed)
spoke to pt & husband [name] to advise that appt time had been moved on 10/17/16 from 11:00am to 3:30pm

## 2016-09-26 DIAGNOSIS — N183 Chronic kidney disease, stage 3 (moderate): Secondary | ICD-10-CM | POA: Diagnosis not present

## 2016-09-26 DIAGNOSIS — I129 Hypertensive chronic kidney disease with stage 1 through stage 4 chronic kidney disease, or unspecified chronic kidney disease: Secondary | ICD-10-CM | POA: Diagnosis not present

## 2016-09-26 DIAGNOSIS — I4891 Unspecified atrial fibrillation: Secondary | ICD-10-CM | POA: Diagnosis not present

## 2016-09-26 DIAGNOSIS — M069 Rheumatoid arthritis, unspecified: Secondary | ICD-10-CM | POA: Diagnosis not present

## 2016-09-26 DIAGNOSIS — T8189XD Other complications of procedures, not elsewhere classified, subsequent encounter: Secondary | ICD-10-CM | POA: Diagnosis not present

## 2016-09-26 DIAGNOSIS — D649 Anemia, unspecified: Secondary | ICD-10-CM | POA: Diagnosis not present

## 2016-09-27 DIAGNOSIS — T8189XD Other complications of procedures, not elsewhere classified, subsequent encounter: Secondary | ICD-10-CM | POA: Diagnosis not present

## 2016-09-27 DIAGNOSIS — I4891 Unspecified atrial fibrillation: Secondary | ICD-10-CM | POA: Diagnosis not present

## 2016-09-27 DIAGNOSIS — D649 Anemia, unspecified: Secondary | ICD-10-CM | POA: Diagnosis not present

## 2016-09-27 DIAGNOSIS — N183 Chronic kidney disease, stage 3 (moderate): Secondary | ICD-10-CM | POA: Diagnosis not present

## 2016-09-27 DIAGNOSIS — M069 Rheumatoid arthritis, unspecified: Secondary | ICD-10-CM | POA: Diagnosis not present

## 2016-09-27 DIAGNOSIS — I129 Hypertensive chronic kidney disease with stage 1 through stage 4 chronic kidney disease, or unspecified chronic kidney disease: Secondary | ICD-10-CM | POA: Diagnosis not present

## 2016-10-01 DIAGNOSIS — I129 Hypertensive chronic kidney disease with stage 1 through stage 4 chronic kidney disease, or unspecified chronic kidney disease: Secondary | ICD-10-CM | POA: Diagnosis not present

## 2016-10-01 DIAGNOSIS — D649 Anemia, unspecified: Secondary | ICD-10-CM | POA: Diagnosis not present

## 2016-10-01 DIAGNOSIS — I4891 Unspecified atrial fibrillation: Secondary | ICD-10-CM | POA: Diagnosis not present

## 2016-10-01 DIAGNOSIS — N183 Chronic kidney disease, stage 3 (moderate): Secondary | ICD-10-CM | POA: Diagnosis not present

## 2016-10-01 DIAGNOSIS — M069 Rheumatoid arthritis, unspecified: Secondary | ICD-10-CM | POA: Diagnosis not present

## 2016-10-01 DIAGNOSIS — T8189XD Other complications of procedures, not elsewhere classified, subsequent encounter: Secondary | ICD-10-CM | POA: Diagnosis not present

## 2016-10-06 DIAGNOSIS — D649 Anemia, unspecified: Secondary | ICD-10-CM | POA: Diagnosis not present

## 2016-10-06 DIAGNOSIS — M069 Rheumatoid arthritis, unspecified: Secondary | ICD-10-CM | POA: Diagnosis not present

## 2016-10-06 DIAGNOSIS — I4891 Unspecified atrial fibrillation: Secondary | ICD-10-CM | POA: Diagnosis not present

## 2016-10-06 DIAGNOSIS — I129 Hypertensive chronic kidney disease with stage 1 through stage 4 chronic kidney disease, or unspecified chronic kidney disease: Secondary | ICD-10-CM | POA: Diagnosis not present

## 2016-10-06 DIAGNOSIS — N183 Chronic kidney disease, stage 3 (moderate): Secondary | ICD-10-CM | POA: Diagnosis not present

## 2016-10-06 DIAGNOSIS — T8189XD Other complications of procedures, not elsewhere classified, subsequent encounter: Secondary | ICD-10-CM | POA: Diagnosis not present

## 2016-10-08 DIAGNOSIS — D649 Anemia, unspecified: Secondary | ICD-10-CM | POA: Diagnosis not present

## 2016-10-08 DIAGNOSIS — I4891 Unspecified atrial fibrillation: Secondary | ICD-10-CM | POA: Diagnosis not present

## 2016-10-08 DIAGNOSIS — M069 Rheumatoid arthritis, unspecified: Secondary | ICD-10-CM | POA: Diagnosis not present

## 2016-10-08 DIAGNOSIS — N183 Chronic kidney disease, stage 3 (moderate): Secondary | ICD-10-CM | POA: Diagnosis not present

## 2016-10-08 DIAGNOSIS — I129 Hypertensive chronic kidney disease with stage 1 through stage 4 chronic kidney disease, or unspecified chronic kidney disease: Secondary | ICD-10-CM | POA: Diagnosis not present

## 2016-10-08 DIAGNOSIS — T8189XD Other complications of procedures, not elsewhere classified, subsequent encounter: Secondary | ICD-10-CM | POA: Diagnosis not present

## 2016-10-09 DIAGNOSIS — N183 Chronic kidney disease, stage 3 (moderate): Secondary | ICD-10-CM | POA: Diagnosis not present

## 2016-10-09 DIAGNOSIS — I129 Hypertensive chronic kidney disease with stage 1 through stage 4 chronic kidney disease, or unspecified chronic kidney disease: Secondary | ICD-10-CM | POA: Diagnosis not present

## 2016-10-09 DIAGNOSIS — I4891 Unspecified atrial fibrillation: Secondary | ICD-10-CM | POA: Diagnosis not present

## 2016-10-09 DIAGNOSIS — D649 Anemia, unspecified: Secondary | ICD-10-CM | POA: Diagnosis not present

## 2016-10-09 DIAGNOSIS — T8189XD Other complications of procedures, not elsewhere classified, subsequent encounter: Secondary | ICD-10-CM | POA: Diagnosis not present

## 2016-10-09 DIAGNOSIS — M069 Rheumatoid arthritis, unspecified: Secondary | ICD-10-CM | POA: Diagnosis not present

## 2016-10-12 ENCOUNTER — Ambulatory Visit: Payer: Medicare Other | Admitting: Radiation Oncology

## 2016-10-12 DIAGNOSIS — N183 Chronic kidney disease, stage 3 (moderate): Secondary | ICD-10-CM | POA: Diagnosis not present

## 2016-10-12 DIAGNOSIS — D649 Anemia, unspecified: Secondary | ICD-10-CM | POA: Diagnosis not present

## 2016-10-12 DIAGNOSIS — T8189XD Other complications of procedures, not elsewhere classified, subsequent encounter: Secondary | ICD-10-CM | POA: Diagnosis not present

## 2016-10-12 DIAGNOSIS — I4891 Unspecified atrial fibrillation: Secondary | ICD-10-CM | POA: Diagnosis not present

## 2016-10-12 DIAGNOSIS — M069 Rheumatoid arthritis, unspecified: Secondary | ICD-10-CM | POA: Diagnosis not present

## 2016-10-12 DIAGNOSIS — I129 Hypertensive chronic kidney disease with stage 1 through stage 4 chronic kidney disease, or unspecified chronic kidney disease: Secondary | ICD-10-CM | POA: Diagnosis not present

## 2016-10-16 ENCOUNTER — Other Ambulatory Visit: Payer: Self-pay | Admitting: *Deleted

## 2016-10-16 DIAGNOSIS — M069 Rheumatoid arthritis, unspecified: Secondary | ICD-10-CM | POA: Diagnosis not present

## 2016-10-16 DIAGNOSIS — T8189XD Other complications of procedures, not elsewhere classified, subsequent encounter: Secondary | ICD-10-CM | POA: Diagnosis not present

## 2016-10-16 DIAGNOSIS — Z170 Estrogen receptor positive status [ER+]: Principal | ICD-10-CM

## 2016-10-16 DIAGNOSIS — C50212 Malignant neoplasm of upper-inner quadrant of left female breast: Secondary | ICD-10-CM

## 2016-10-16 DIAGNOSIS — I129 Hypertensive chronic kidney disease with stage 1 through stage 4 chronic kidney disease, or unspecified chronic kidney disease: Secondary | ICD-10-CM | POA: Diagnosis not present

## 2016-10-16 DIAGNOSIS — I4891 Unspecified atrial fibrillation: Secondary | ICD-10-CM | POA: Diagnosis not present

## 2016-10-16 DIAGNOSIS — N183 Chronic kidney disease, stage 3 (moderate): Secondary | ICD-10-CM | POA: Diagnosis not present

## 2016-10-16 DIAGNOSIS — D649 Anemia, unspecified: Secondary | ICD-10-CM | POA: Diagnosis not present

## 2016-10-17 ENCOUNTER — Ambulatory Visit (HOSPITAL_BASED_OUTPATIENT_CLINIC_OR_DEPARTMENT_OTHER): Payer: Medicare Other | Admitting: Oncology

## 2016-10-17 ENCOUNTER — Telehealth: Payer: Self-pay | Admitting: Oncology

## 2016-10-17 ENCOUNTER — Other Ambulatory Visit (HOSPITAL_BASED_OUTPATIENT_CLINIC_OR_DEPARTMENT_OTHER): Payer: Medicare Other

## 2016-10-17 VITALS — BP 120/59 | HR 84 | Temp 98.6°F | Resp 18 | Ht 60.0 in | Wt 257.0 lb

## 2016-10-17 DIAGNOSIS — C50212 Malignant neoplasm of upper-inner quadrant of left female breast: Secondary | ICD-10-CM | POA: Diagnosis not present

## 2016-10-17 DIAGNOSIS — Z170 Estrogen receptor positive status [ER+]: Principal | ICD-10-CM

## 2016-10-17 LAB — CBC WITH DIFFERENTIAL/PLATELET
BASO%: 1 % (ref 0.0–2.0)
BASOS ABS: 0.1 10*3/uL (ref 0.0–0.1)
EOS%: 5 % (ref 0.0–7.0)
Eosinophils Absolute: 0.3 10*3/uL (ref 0.0–0.5)
HCT: 35.9 % (ref 34.8–46.6)
HEMOGLOBIN: 11.7 g/dL (ref 11.6–15.9)
LYMPH%: 30.8 % (ref 14.0–49.7)
MCH: 27.9 pg (ref 25.1–34.0)
MCHC: 32.6 g/dL (ref 31.5–36.0)
MCV: 85.5 fL (ref 79.5–101.0)
MONO#: 0.6 10*3/uL (ref 0.1–0.9)
MONO%: 9.2 % (ref 0.0–14.0)
NEUT%: 54 % (ref 38.4–76.8)
NEUTROS ABS: 3.5 10*3/uL (ref 1.5–6.5)
Platelets: 301 10*3/uL (ref 145–400)
RBC: 4.2 10*6/uL (ref 3.70–5.45)
RDW: 17.7 % — AB (ref 11.2–14.5)
WBC: 6.5 10*3/uL (ref 3.9–10.3)
lymph#: 2 10*3/uL (ref 0.9–3.3)

## 2016-10-17 LAB — COMPREHENSIVE METABOLIC PANEL
ALBUMIN: 3.9 g/dL (ref 3.5–5.0)
ALK PHOS: 74 U/L (ref 40–150)
ALT: 26 U/L (ref 0–55)
AST: 22 U/L (ref 5–34)
Anion Gap: 11 mEq/L (ref 3–11)
BUN: 22.9 mg/dL (ref 7.0–26.0)
CO2: 25 mEq/L (ref 22–29)
Calcium: 9.9 mg/dL (ref 8.4–10.4)
Chloride: 105 mEq/L (ref 98–109)
Creatinine: 1.1 mg/dL (ref 0.6–1.1)
EGFR: 51 mL/min/{1.73_m2} — AB (ref >90)
GLUCOSE: 123 mg/dL (ref 70–140)
POTASSIUM: 4.1 meq/L (ref 3.5–5.1)
Sodium: 141 mEq/L (ref 136–145)
Total Bilirubin: 0.34 mg/dL (ref 0.20–1.20)
Total Protein: 6.8 g/dL (ref 6.4–8.3)

## 2016-10-17 NOTE — Progress Notes (Signed)
Debra Griffith  [phone] [phone]     ID: Debra Griffith DOB: [birth year]  MR#: [id number]  EXB#:[id number]  Patient Care Team: Josetta Huddle, MD as PCP - General (Internal Medicine) Chauncey Cruel, MD as Consulting Physician (Oncology) Rolm Bookbinder, MD as Consulting Physician (General Surgery) Irene Limbo, MD as Consulting Physician (Plastic Surgery) Gaynelle Arabian, MD as Consulting Physician (Orthopedic Surgery) Bo Merino, MD as Consulting Physician (Rheumatology)  OTHER MD:  CHIEF COMPLAINT: Estrogen receptor positive breast cancer  CURRENT TREATMENT: Anastrozole   BREAST CANCER HISTORY: From the original intake note:  Jamesina had minor trauma to the left breast but did not think much about it until while swimming on the medication she felt a hard mass in her left breast. She brought it to medical attention and on 05/04/2016 she underwent left diagnostic mammography with tomography at the breast Center. This showed the breast density to be category B. In the left breast upper inner quadrant there was a persistent area of asymmetry measuring 4.9 cm. On exam this was a firm but poorly defined palpable mass. Biopsy of this mass 05/04/2016 showed ([id number]) an invasive ductal carcinoma, grade 1, estrogen receptor 90% positive with moderate staining intensity, progesterone receptor 90% positive with strong staining intensity, with an MIB-1 of 10%, and no HER-2 amplification, the signals ratio being 1.19 and the number per cell 1.90.  On 05/09/2016 the patient had ultrasonography of the left axilla which was benign. On the same day she had a second biopsy of what is either a second mass or a distant area of the same mass (in the same quadrant) and this showed ([id number]) invasive ductal carcinoma, grade 2, estrogen receptor 100% positive, progesterone receptor 90% positive, both with strong staining intensity, with an MIB-1 of 15%,  and no HER-2 amplification, the signals ratio being 1.50 and the number per cell 3.15.  The patient's subsequent history is as detailed below.  INTERVAL HISTORY: Debra Griffith returns today for follow-up of her estrogen receptor positive breast cancer. Her interval history has been complex but the bottom line is that her lumpectomy never did heal, with significant fat necrosis in an wound dehiscence problems leading to left total mastectomy 08/02/2016. The pathology from that procedure ([id number]) showed no evidence of malignancy. There was significant inflammation with abscess and of course scars from the prior procedure.  Given that the margins were clear after her mastectomy and that there were no involved lymph nodes, she did not proceed to radiation.  She continues on anastrozole. She tolerates this well. Hot flashes and vaginal dryness are not a major issue. She never developed the arthralgias or myalgias that many patients can experience on this medication. She obtains it at a good price.  REVIEW OF SYSTEMS: Her postoperative course remain very complex and she has required significant attention from both the general surgeon and the plastic surgeon. At this point however, while still undergoing some rehabilitation, she feels she is "coming around". She is able to walk some stairs in her own home. She is beginning to do some housework. She denies fever, rash, bleeding, or significant pain. She remains significantly fatigued. A detailed review of systems was otherwise stable.   PAST MEDICAL HISTORY: Past Medical History:  Diagnosis Date  . Anemia    hx of   . Anxiety   . Breast cancer in female [facility]) 03/2016   left  . Chronic bronchitis ([place])    FLARE UPS USUALLY ONCE A YEAR  .  Chronic lower back pain   . Complication of anesthesia    trouble waking up   . CRI (chronic renal insufficiency) 07/27/2016  . DDD (degenerative disc disease), lumbar   . Depression   . GERD (gastroesophageal  reflux disease)   . History of recent fall    "twice on Sunday; once yesterday" (08/29/2016)  . Hypertension   . Inflammatory arthritis 07/27/2016   Sero Negative, Positive Synovitis hands, [place]  . Insomnia   . Irregular heart beats   . Macular degeneration of right eye   . Obesity   . Osteoarthritis of both feet 07/27/2016  . Osteoarthritis of both hands 07/27/2016  . PONV (postoperative nausea and vomiting)    SEVERE N&V AND DIARRHEA AFTER HYSTERECTOMY AND AFTER WISDOM TEETH EXTRACTIONS - NO PROBLEMS WITH LAST 2 SURGERIES - THE HIP AND LEFT KNEE  . RBBB (right bundle branch block) 07/27/2016  . Rheumatoid arthritis [facility])    "doctor recently took me off all RX for this" (08/02/2016)  . Right bundle branch block   . Right bundle branch block   . S/p dental crown    dental crowns on every tooth  . Vitamin D deficiency     PAST SURGICAL HISTORY: Past Surgical History:  Procedure Laterality Date  . ABDOMINAL HYSTERECTOMY    . AXILLARY SURGERY Left 06/25/2016   Aspiration of left axillary seroma   . BREAST BIOPSY Left 03/2016  . BREAST LUMPECTOMY WITH RADIOACTIVE SEED AND SENTINEL LYMPH NODE BIOPSY Left 06/13/2016   Procedure: LEFT BREAST LUMPECTOMY WITH BRACKETED  RADIOACTIVE SEED AND SENTINEL LYMPH NODE BIOPSY;  Surgeon: Rolm Bookbinder, MD;  Location: [place];  Service: General;  Laterality: Left;  . BREAST RECONSTRUCTION Bilateral 06/25/2016   BILATERAL ONCOPLASTIC BREAST RECONSTRUCTION WITH BREAST REDUCTION  . BREAST RECONSTRUCTION WITH PLACEMENT OF TISSUE EXPANDER AND FLEX HD (ACELLULAR HYDRATED DERMIS) Bilateral 06/25/2016   Procedure: BILATERAL ONCOPLASTIC BREAST RECONSTRUCTION WITH BREAST REDUCTION, Aspiration of left axillary seroma;  Surgeon: Irene Limbo, MD;  Location: [place];  Service: Plastics;  Laterality: Bilateral;  . BREAST REDUCTION SURGERY Bilateral 06/25/2016   Procedure: MAMMARY REDUCTION  (BREAST) BILATERAL;  Surgeon: Irene Limbo, MD;  Location: [place];   Service: Plastics;  Laterality: Bilateral;  . CESAREAN SECTION  1966; 1971; 1973  . COLONOSCOPY    . DEBRIDEMENT AND CLOSURE WOUND Right 07/11/2016   Procedure: DEBRIDEMENT LEFT BREAST;  Surgeon: Irene Limbo, MD;  Location: [place];  Service: Plastics;  Laterality: Right;  . JOINT REPLACEMENT    . KNEE ARTHROSCOPY Right    "before replacement"  . MASTECTOMY COMPLETE / SIMPLE Left 08/02/2016   total  . REDUCTION MAMMAPLASTY Bilateral 06/25/2016  . TOTAL HIP ARTHROPLASTY  06/24/2012   Procedure: TOTAL HIP ARTHROPLASTY;  Surgeon: [name], MD;  Location: [facility];  Service: Orthopedics;  Laterality: Left;  . TOTAL KNEE ARTHROPLASTY N/A 11/02/2013   Procedure: LEFT TOTAL KNEE ARTHROPLASTY WITH RIGHT KNEE CORTISONE INJECTION;  Surgeon: [name], MD;  Location: [facility];  Service: Orthopedics;  Laterality: N/A;  . TOTAL KNEE ARTHROPLASTY Right 05/31/2014   Procedure: RIGHT TOTAL KNEE ARTHROPLASTY;  Surgeon: [name], MD;  Location: [facility];  Service: Orthopedics;  Laterality: Right;  . TOTAL MASTECTOMY Left 08/02/2016   Procedure: LEFT TOTAL MASTECTOMY;  Surgeon: Rolm Bookbinder, MD;  Location: [place];  Service: General;  Laterality: Left;  . TUBAL LIGATION  1973  . WISDOM TOOTH EXTRACTION  1970's   admitted to hospital for surgery  FAMILY HISTORY Family History  Problem Relation Age of Onset  . Diabetes Mother     The patient's mother died at age 32. The patient's father died at age 13 following a stroke. The patient had no brothers, 1 sister. There is no history of breast or ovarian cancer in the family.   GYNECOLOGIC HISTORY:  No LMP recorded. Patient has had a hysterectomy. Menarche age 24, first live birth age 69. The patient is GX P3. She underwent total abdominal hysterectomy, with bilateral salpingo-oophorectomy, in 1993. She used estrogen replacement approximately 9 months. She never used oral contraceptives.  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 Her husband [name] used to run for season small and similar businesses but he also is retired. Son Aaron Edelman is a Programme researcher, broadcasting/film/video in [place]. Son Legrand Como is a Engineer, maintenance (IT) in [facility] [employer]. Daughter Seleena Reimers lives in [place]. 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 The patient has 5 grandchildren. 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    ADVANCED DIRECTIVES: In place   HEALTH MAINTENANCE: Social History  Substance Use Topics  . Smoking status: Never Smoker  . Smokeless tobacco: Never Used  . Alcohol use No     Colonoscopy:2015/[facility]  PAP:  Bone density: Remote   Allergies  Allergen Reactions  . Morphine And Related Other (See Comments)    hallucinations    Current Outpatient Prescriptions  Medication Sig Dispense Refill  . amoxicillin-clavulanate (AUGMENTIN) 875-125 MG tablet Take 1 tablet by mouth 2 (two) times daily. 14 tablet 1  . anastrozole (ARIMIDEX) 1 MG tablet Take 1 tablet (1 mg total) by mouth daily. 30 tablet 6  . apixaban (ELIQUIS) 5 MG TABS tablet Take 1 tablet (5 mg total) by mouth 2 (two) times daily. 60 tablet 2  . buPROPion (WELLBUTRIN SR) 150 MG 12 hr tablet Take 150 mg by mouth every morning.    . cholecalciferol (VITAMIN D) 1000 units tablet Take 1,000 Units by mouth daily.    . clonazePAM (KLONOPIN) 1 MG tablet Take 2 mg by mouth 2 (two) times daily.     . Cyanocobalamin (B-12) 500 MCG TABS Take 500 mcg by mouth daily.    Marland Kitchen MAGNESIUM PO Take 2 tablets by mouth daily.    . metoprolol tartrate (LOPRESSOR) 25 MG tablet Take 25 mg by mouth 2 (two) times daily.     . Misc Natural Products (TART CHERRY ADVANCED PO) Take 1 capsule by mouth daily.    . Omega-3 Fatty Acids (FISH OIL) 1000 MG CAPS Take 1,000 mg by mouth daily.    . Turmeric 500 MG CAPS Take 500 mg by mouth daily.     . vitamin C (ASCORBIC ACID) 500 MG tablet Take 1,000 mg by mouth daily.     No current facility-administered  medications for this visit.    Facility-Administered Medications Ordered in Other Visits  Medication Dose Route Frequency Provider Last Rate Last Dose  . 0.9 %  sodium chloride infusion   Intravenous Continuous Rolm Bookbinder, MD      . acetaminophen (TYLENOL) tablet 650 mg  650 mg Oral Q6H PRN Rolm Bookbinder, MD       Or  . acetaminophen (TYLENOL) suppository 650 mg  650 mg Rectal Q6H PRN Rolm Bookbinder, MD      . ceFAZolin (ANCEF) 2 g in dextrose 5 % 100 mL IVPB  2 g Intravenous Q8H Rolm Bookbinder, MD      .  enoxaparin (LOVENOX) injection 40 mg  40 mg Subcutaneous Q24H Rolm Bookbinder, MD      . methocarbamol (ROBAXIN) tablet 500 mg  500 mg Oral Q6H PRN Rolm Bookbinder, MD      . metroNIDAZOLE (FLAGYL) tablet 500 mg  500 mg Oral Q8H Rolm Bookbinder, MD      . ondansetron (ZOFRAN-ODT) disintegrating tablet 4 mg  4 mg Oral Q6H PRN Rolm Bookbinder, MD       Or  . ondansetron [facility]) 4 mg in sodium chloride 0.9 % 50 mL IVPB  4 mg Intravenous Q6H PRN Rolm Bookbinder, MD      . oxyCODONE (Oxy IR/ROXICODONE) immediate release tablet 5-10 mg  5-10 mg Oral Q4H PRN Rolm Bookbinder, MD      . simethicone [facility]) chewable tablet 40 mg  40 mg Oral Q6H PRN Rolm Bookbinder, MD        OBJECTIVE:Morbidly obese older white woman In no acute distress Vitals:   10/17/16 1509  BP: (!) 120/59  Pulse: 84  Resp: 18  Temp: 98.6 F (37 C)     Body mass index is 50.19 kg/m.    ECOG FS:2 - Symptomatic, <50% confined to bed  Sclerae unicteric, pupils round and equal Oropharynx clear and moist-- no thrush or other lesions No cervical or supraclavicular adenopathy Lungs no rales or rhonchi Heart regular rate and rhythm Abd soft, obese, nontender, positive bowel sounds MSK no focal spinal tenderness, no upper extremity lymphedema Neuro: nonfocal, well oriented, appropriate affect Breasts: The right breast is status post reduction mammoplasty. It is otherwise unremarkable. Left  breast is status post mastectomy and is imaged below. There is no evidence of chest wall recurrence. The left axilla is benign.  Photo of left chest wall 10/17/2016    LAB RESULTS:  CMP     Component Value Date/Time   NA 141 10/17/2016 1451   K 4.1 10/17/2016 1451   CL 107 09/02/2016 0351   CO2 25 10/17/2016 1451   GLUCOSE 123 10/17/2016 1451   BUN 22.9 10/17/2016 1451   CREATININE 1.1 10/17/2016 1451   CALCIUM 9.9 10/17/2016 1451   PROT 6.8 10/17/2016 1451   ALBUMIN 3.9 10/17/2016 1451   AST 22 10/17/2016 1451   ALT 26 10/17/2016 1451   ALKPHOS 74 10/17/2016 1451   BILITOT 0.34 10/17/2016 1451   GFRNONAA >60 09/02/2016 0351   GFRAA >60 09/02/2016 0351    INo results found for: SPEP, UPEP  Lab Results  Component Value Date   WBC 6.5 10/17/2016   NEUTROABS 3.5 10/17/2016   HGB 11.7 10/17/2016   HCT 35.9 10/17/2016   MCV 85.5 10/17/2016   PLT 301 10/17/2016      Chemistry      Component Value Date/Time   NA 141 10/17/2016 1451   K 4.1 10/17/2016 1451   CL 107 09/02/2016 0351   CO2 25 10/17/2016 1451   BUN 22.9 10/17/2016 1451   CREATININE 1.1 10/17/2016 1451   GLU 95 01/20/2016      Component Value Date/Time   CALCIUM 9.9 10/17/2016 1451   ALKPHOS 74 10/17/2016 1451   AST 22 10/17/2016 1451   ALT 26 10/17/2016 1451   BILITOT 0.34 10/17/2016 1451       No results found for: LABCA2  No components found for: LABCA125  No results for input(s): INR in the last 168 hours.  Urinalysis    Component Value Date/Time   COLORURINE YELLOW 08/29/2016 2120   APPEARANCEUR HAZY (A) 08/29/2016 2120  LABSPEC 1.014 08/29/2016 2120   PHURINE 5.0 08/29/2016 2120   GLUCOSEU NEGATIVE 08/29/2016 2120   HGBUR NEGATIVE 08/29/2016 2120   BILIRUBINUR NEGATIVE 08/29/2016 2120   Chloride 08/29/2016 2120   PROTEINUR NEGATIVE 08/29/2016 2120   UROBILINOGEN 0.2 05/26/2014 1054   NITRITE NEGATIVE 08/29/2016 2120   LEUKOCYTESUR LARGE (A) 08/29/2016 2120      STUDIES: CLINICAL DATA:  Acute onset confusion. Falls at home. History of breast cancer, chronic renal insufficiency.  EXAM: CT HEAD WITHOUT CONTRAST  TECHNIQUE: Contiguous axial images were obtained from the base of the skull through the vertex without intravenous contrast.  COMPARISON:  None.  FINDINGS: BRAIN: The ventricles and sulci are normal for age. No intraparenchymal hemorrhage, mass effect nor midline shift. Patchy supratentorial white matter hypodensities within normal range for patient's age, though non-specific are most compatible with chronic small vessel ischemic disease. No acute large vascular territory infarcts. No abnormal extra-axial fluid collections. Basal cisterns are patent.  VASCULAR: Mild calcific atherosclerosis of the carotid siphons.  SKULL: No skull fracture. Tiny lucencies in the calvarium. No significant scalp soft tissue swelling.  SINUSES/ORBITS: Focal mucosal thickening versus air-fluid level RIGHT sphenoid sinus. The included ocular globes and orbital contents are non-suspicious.  OTHER: None.  IMPRESSION: No acute intracranial process.  Mild chronic small vessel ischemic disease.  Tiny calvarial lucencies are likely benign though given patient's history of cancer metastasis not excluded.  These results will be called to the ordering clinician or representative by the Radiologist Assistant, and communication documented in the [facility] Dashboard   Electronically Signed   By: Elon Alas M.D.   On: 08/29/2016 21:53  ELIGIBLE FOR AVAILABLE RESEARCH PROTOCOL: no  ASSESSMENT: 76 y.o. [place] woman status post left breast upper inner quadrant biopsy 05/04/2016 for a clinical T2 N0 invasive ductal carcinoma, grade 1 estrogen and progesterone receptor positive, HER-2 nonamplified, with an MIB-1 of 10%.  (1) biopsy of a second area 05/09/2016 also in the upper inner quadrant of the left breast showed  invasive ductal carcinoma, grade 2, estrogen and progesterone receptor positive, HER-2 nonamplified, with an MIB-1 of 15%.  (2) status post left lumpectomy and sentinel lymph node sampling 06/13/2016 for an mpT1c pN0(i+), stage IA invasive ductal carcinoma, grade 1, with close but negative margins  (a) status post bilateral reduction mammoplasty with left oncoplastic surgery 06/25/2016  (b) status post debridement of left nipple/areolar necrosis 07/11/2016  (c) status post left simple mastectomy 08/02/2016 showed inflammation and abscess but no evidence of malignancy  (3) Oncotype score of 18 predicts a 10 year risk of recurrence outside the breast of 11% if the patient's only systemic therapy is tamoxifen for 5 years. It also predicts no significant benefit from chemotherapy  (4) postmastectomy radiation not indicated  (5) started anastrozole September 2017  PLAN: I spent approximately 30 minutes with Debra Griffith with most of that time spent discussing her complex problems.  She is finally making steady progress and coming closer to her baseline. We reviewed her situation and she understands we generally do not do postmastectomy radiation in the absence of lymph node positivity, a very large tumor, or positive margins, none of which are the case here.  We also reviewed her systemic therapy options and she understands that the Oncotype score of 18 predicts no significant benefit from chemotherapy and also predicts a good prognosis without chemotherapy.  Accordingly the plan for her is to continue anastrozole for a minimum of 5 years. She is tolerating this well. We will  obtain a bone density when she has a repeat mammography in August. I would anticipate that she will not need pharmacologic intervention for that problem  I encouraged her to continue her excellent physical therapy program.  She knows to call for any problems that may develop before her next visit here.   Chauncey Cruel, MD    10/18/2016 6:52 PM Medical Oncology and Hematology [facility] [address] [place], [place] [address] Tel. [phone]    Fax. [phone]

## 2016-10-17 NOTE — Telephone Encounter (Signed)
sched 3 month  follow up appt per sch msg. Pt requested a morning appt for follow up. Pt has new appt date/time

## 2016-10-18 DIAGNOSIS — I4891 Unspecified atrial fibrillation: Secondary | ICD-10-CM | POA: Diagnosis not present

## 2016-10-18 DIAGNOSIS — T8189XD Other complications of procedures, not elsewhere classified, subsequent encounter: Secondary | ICD-10-CM | POA: Diagnosis not present

## 2016-10-18 DIAGNOSIS — N183 Chronic kidney disease, stage 3 (moderate): Secondary | ICD-10-CM | POA: Diagnosis not present

## 2016-10-18 DIAGNOSIS — I129 Hypertensive chronic kidney disease with stage 1 through stage 4 chronic kidney disease, or unspecified chronic kidney disease: Secondary | ICD-10-CM | POA: Diagnosis not present

## 2016-10-18 DIAGNOSIS — D649 Anemia, unspecified: Secondary | ICD-10-CM | POA: Diagnosis not present

## 2016-10-18 DIAGNOSIS — M069 Rheumatoid arthritis, unspecified: Secondary | ICD-10-CM | POA: Diagnosis not present

## 2016-10-23 DIAGNOSIS — N183 Chronic kidney disease, stage 3 (moderate): Secondary | ICD-10-CM | POA: Diagnosis not present

## 2016-10-23 DIAGNOSIS — T8189XD Other complications of procedures, not elsewhere classified, subsequent encounter: Secondary | ICD-10-CM | POA: Diagnosis not present

## 2016-10-23 DIAGNOSIS — D649 Anemia, unspecified: Secondary | ICD-10-CM | POA: Diagnosis not present

## 2016-10-23 DIAGNOSIS — I129 Hypertensive chronic kidney disease with stage 1 through stage 4 chronic kidney disease, or unspecified chronic kidney disease: Secondary | ICD-10-CM | POA: Diagnosis not present

## 2016-10-23 DIAGNOSIS — M069 Rheumatoid arthritis, unspecified: Secondary | ICD-10-CM | POA: Diagnosis not present

## 2016-10-23 DIAGNOSIS — I4891 Unspecified atrial fibrillation: Secondary | ICD-10-CM | POA: Diagnosis not present

## 2016-10-25 DIAGNOSIS — N183 Chronic kidney disease, stage 3 (moderate): Secondary | ICD-10-CM | POA: Diagnosis not present

## 2016-10-25 DIAGNOSIS — I4891 Unspecified atrial fibrillation: Secondary | ICD-10-CM | POA: Diagnosis not present

## 2016-10-25 DIAGNOSIS — M069 Rheumatoid arthritis, unspecified: Secondary | ICD-10-CM | POA: Diagnosis not present

## 2016-10-25 DIAGNOSIS — T8189XD Other complications of procedures, not elsewhere classified, subsequent encounter: Secondary | ICD-10-CM | POA: Diagnosis not present

## 2016-10-25 DIAGNOSIS — M170 Bilateral primary osteoarthritis of knee: Secondary | ICD-10-CM | POA: Insufficient documentation

## 2016-10-25 DIAGNOSIS — I129 Hypertensive chronic kidney disease with stage 1 through stage 4 chronic kidney disease, or unspecified chronic kidney disease: Secondary | ICD-10-CM | POA: Diagnosis not present

## 2016-10-25 DIAGNOSIS — D649 Anemia, unspecified: Secondary | ICD-10-CM | POA: Diagnosis not present

## 2016-10-25 NOTE — Progress Notes (Signed)
Office Visit Note  Patient: Debra Griffith             Date of Birth: [birth year]           MRN: [id number]             PCP: Henrine Screws, MD Referring: Josetta Huddle, MD Visit Date: 10/29/2016 Occupation: @GUAROCC @    Subjective:  No chief complaint on file. Follow-up on inflammatory arthritis, high risk prescription, status post surgery for left breast cancer  History of Present Illness: Debra Griffith is a 76 y.o. female  Last seen 01/26/2016  Doing well with seronegative inflammatory arthritis. See ultrasound done by Dr. Estanislado Pandy in the past for full details. Patient has been on Plaquenil in the past but had to suspend Plaquenil and has been off of it since August 2017 due to recent surgery of the left breast.  She  did well with the surgery according to the patient but she did have sepsis and was hospitalized for a few days. Please see hospital visit note for full details.  When she was on the Plaquenil, she took 20 mg twice a day Monday through Friday.  Despite being off of Plaquenil for several months, she has not had any flare whatsoever. She is quite pleased with her current inflammatory arthritis.  Her bilateral total knee replacement is also doing well. She also has a left total hip replacement which is doing well.  Patient will be due for Plaquenil eye exam and she plans to get them done through her new eye doctor, Dr. Zadie Rhine.  Activities of Daily Living:  Patient reports morning stiffness for 15 minutes.   Patient Denies nocturnal pain.  Difficulty dressing/grooming: Denies Difficulty climbing stairs: Denies Difficulty getting out of chair: Denies Difficulty using hands for taps, buttons, cutlery, and/or writing: Denies   Review of Systems  Constitutional: Negative for fatigue.  HENT: Negative for mouth sores and mouth dryness.   Eyes: Negative for dryness.  Respiratory: Negative for shortness of breath.   Gastrointestinal: Negative for  constipation and diarrhea.  Musculoskeletal: Negative for myalgias and myalgias.  Skin: Negative for sensitivity to sunlight.  Psychiatric/Behavioral: Negative for decreased concentration and sleep disturbance.    PMFS History:  Patient Active Problem List   Diagnosis Date Noted  . Primary osteoarthritis of both knees 10/25/2016  . New onset atrial fibrillation ([place])   . Cellulitis 08/30/2016  . Cellulitis of left breast 08/29/2016  . Dehydration 08/29/2016  . Hypotension 08/29/2016  . Confusion 08/29/2016  . RBBB (right bundle branch block) 07/27/2016  . CRI (chronic renal insufficiency) 07/27/2016  . DDD (degenerative disc disease), lumbar 07/27/2016  . Primary osteoarthritis of both feet 07/27/2016  . Primary osteoarthritis of both hands 07/27/2016  . Inflammatory arthritis 07/27/2016  . Malignant neoplasm of upper-inner quadrant of left breast in female, estrogen receptor positive ([place]) 05/31/2016  . Depression 06/03/2014  . Constipation 11/19/2013  . Insomnia 11/19/2013  . Anxiety 11/19/2013  . Status post total bilateral knee replacement 11/13/2013  . Essential hypertension   . GERD (gastroesophageal reflux disease)   . OA (osteoarthritis) of knee 11/02/2013  . Postop Sinus tachycardia 06/27/2012  . Postop Hyponatremia 06/25/2012  . Postop Acute blood loss anemia 06/25/2012  . OA (osteoarthritis) of hip 06/24/2012    Past Medical History:  Diagnosis Date  . Anemia    hx of   . Anxiety   . Breast cancer in female [facility]) 03/2016   left  . Chronic bronchitis (  [place])    FLARE UPS USUALLY ONCE A YEAR  . Chronic lower back pain   . Complication of anesthesia    trouble waking up   . CRI (chronic renal insufficiency) 07/27/2016  . DDD (degenerative disc disease), lumbar   . Depression   . GERD (gastroesophageal reflux disease)   . History of recent fall    "twice on Sunday; once yesterday" (08/29/2016)  . Hypertension   . Inflammatory arthritis 07/27/2016   Sero  Negative, Positive Synovitis hands, [place]  . Insomnia   . Irregular heart beats   . Macular degeneration of right eye   . Obesity   . Osteoarthritis of both feet 07/27/2016  . Osteoarthritis of both hands 07/27/2016  . PONV (postoperative nausea and vomiting)    SEVERE N&V AND DIARRHEA AFTER HYSTERECTOMY AND AFTER WISDOM TEETH EXTRACTIONS - NO PROBLEMS WITH LAST 2 SURGERIES - THE HIP AND LEFT KNEE  . RBBB (right bundle branch block) 07/27/2016  . Rheumatoid arthritis [facility])    "doctor recently took me off all RX for this" (08/02/2016)  . Right bundle branch block   . Right bundle branch block   . S/p dental crown    dental crowns on every tooth  . Vitamin D deficiency     Family History  Problem Relation Age of Onset  . Diabetes Mother    Past Surgical History:  Procedure Laterality Date  . ABDOMINAL HYSTERECTOMY    . AXILLARY SURGERY Left 06/25/2016   Aspiration of left axillary seroma   . BREAST BIOPSY Left 03/2016  . BREAST LUMPECTOMY WITH RADIOACTIVE SEED AND SENTINEL LYMPH NODE BIOPSY Left 06/13/2016   Procedure: LEFT BREAST LUMPECTOMY WITH BRACKETED  RADIOACTIVE SEED AND SENTINEL LYMPH NODE BIOPSY;  Surgeon: Rolm Bookbinder, MD;  Location: [place];  Service: General;  Laterality: Left;  . BREAST RECONSTRUCTION Bilateral 06/25/2016   BILATERAL ONCOPLASTIC BREAST RECONSTRUCTION WITH BREAST REDUCTION  . BREAST RECONSTRUCTION WITH PLACEMENT OF TISSUE EXPANDER AND FLEX HD (ACELLULAR HYDRATED DERMIS) Bilateral 06/25/2016   Procedure: BILATERAL ONCOPLASTIC BREAST RECONSTRUCTION WITH BREAST REDUCTION, Aspiration of left axillary seroma;  Surgeon: Irene Limbo, MD;  Location: [place];  Service: Plastics;  Laterality: Bilateral;  . BREAST REDUCTION SURGERY Bilateral 06/25/2016   Procedure: MAMMARY REDUCTION  (BREAST) BILATERAL;  Surgeon: Irene Limbo, MD;  Location: [place];  Service: Plastics;  Laterality: Bilateral;  . CESAREAN SECTION  1966; 1971; 1973  . COLONOSCOPY    .  DEBRIDEMENT AND CLOSURE WOUND Right 07/11/2016   Procedure: DEBRIDEMENT LEFT BREAST;  Surgeon: Irene Limbo, MD;  Location: [place];  Service: Plastics;  Laterality: Right;  . JOINT REPLACEMENT    . KNEE ARTHROSCOPY Right    "before replacement"  . MASTECTOMY COMPLETE / SIMPLE Left 08/02/2016   total  . REDUCTION MAMMAPLASTY Bilateral 06/25/2016  . TOTAL HIP ARTHROPLASTY  06/24/2012   Procedure: TOTAL HIP ARTHROPLASTY;  Surgeon: [name], MD;  Location: [facility];  Service: Orthopedics;  Laterality: Left;  . TOTAL KNEE ARTHROPLASTY N/A 11/02/2013   Procedure: LEFT TOTAL KNEE ARTHROPLASTY WITH RIGHT KNEE CORTISONE INJECTION;  Surgeon: [name], MD;  Location: [facility];  Service: Orthopedics;  Laterality: N/A;  . TOTAL KNEE ARTHROPLASTY Right 05/31/2014   Procedure: RIGHT TOTAL KNEE ARTHROPLASTY;  Surgeon: [name], MD;  Location: [facility];  Service: Orthopedics;  Laterality: Right;  . TOTAL MASTECTOMY Left 08/02/2016   Procedure: LEFT TOTAL MASTECTOMY;  Surgeon: Rolm Bookbinder, MD;  Location: [place];  Service: General;  Laterality: Left;  . TUBAL LIGATION  1973  . WISDOM TOOTH EXTRACTION  1970's   admitted to hospital for surgery   Social History   Social History Narrative  . No narrative on file     Objective: Vital Signs: There were no vitals taken for this visit.   Physical Exam  Constitutional: She is oriented to person, place, and time. She appears well-developed and well-nourished.  HENT:  Head: Normocephalic and atraumatic.  Eyes: EOM are normal. Pupils are equal, round, and reactive to light.  Cardiovascular: Normal rate, regular rhythm and normal heart sounds.  Exam reveals no gallop and no friction rub.   No murmur heard. Pulmonary/Chest: Effort normal and breath sounds normal. She has no wheezes. She has no rales.  Abdominal: Soft. Bowel sounds are normal. She exhibits no distension. There is no tenderness. There is no guarding. No hernia.    Musculoskeletal: Normal range of motion. She exhibits no edema, tenderness or deformity.  Lymphadenopathy:    She has no cervical adenopathy.  Neurological: She is alert and oriented to person, place, and time. Coordination normal.  Skin: Skin is warm and dry. Capillary refill takes less than 2 seconds. No rash noted.  Psychiatric: She has a normal mood and affect. Her behavior is normal.  Nursing note and vitals reviewed.    Musculoskeletal Exam:  Full range of motion of all joints Grip strength is equal and strong bilaterally Fibromyalgia tender points are all absent  CDAI Exam: CDAI Homunculus Exam:   Joint Counts:  CDAI Tender Joint count: 0 CDAI Swollen Joint count: 0  Global Assessments:  Patient Global Assessment: 0 Provider Global Assessment: 0    Investigation: Findings:  Labs from 04/21/2014 show CMP with GFR with decreased GFR at 48, increased creatinine at 1.15, thyroid panel is normal except for slight elevation of T3 at 41% which is close to normal range.  Sed rate is normal.  TSH is normal.  Free thyroxine is normal.  T4 is normal.  CBC with diff is normal except for slight decrease in hemoglobin, hematocrit.  Uric acid is slightly elevated at 8.8, but no flare, no gout flare.  CK is normal, ACE is normal, RF is normal, ANA is negative, CCP is normal.  04/21/214 X-rays of bilateral hands, 2 views today, showed all DIP and PIP narrowing, right 2nd and 3rd MCP narrowing, and right CMC severe narrowing and subluxation consistent with osteoarthritis.  The MCP narrowing could be consistent with inflammatory arthritis.   Bilateral feet x-rays showed bilateral 1st MTP narrowing, PIP and DIP narrowing, and intercarpal joint space narrowing with a calcaneal spur on her left heel.  These findings are consistent with osteoarthritis.   Right knee joint showed severe medial compartment narrowing and severe patellofemoral narrowing consistent with end-stage osteoarthritis.    09/22/2014 Per EULAR recommendation, ultrasound examination of bilateral hands was performed.  Using grayscale power Doppler and 12 MHz transducer bilateral second, third, and fifth MCP joint and bilateral wrist joint, both dorsal and volar aspect were evaluation to look for any underlying synovitis or tenosynovitis.  The findings were she had synovitis and in second, third, and fifth bilateral MCP joints and bilateral wrist joints.  The right median nerve was 0.21 cm square and left median nerve was 0.20 cm square, which is more than upper limits of normal.  These findings are consistent with inflammatory arthritis, which we discussed with her briefly.  Appointment on 10/17/2016  Component Date Value Ref Range Status  .  WBC 10/17/2016 6.5  3.9 - 10.3 10e3/uL Final  . NEUT# 10/17/2016 3.5  1.5 - 6.5 10e3/uL Final  . HGB 10/17/2016 11.7  11.6 - 15.9 g/dL Final  . HCT 10/17/2016 35.9  34.8 - 46.6 % Final  . Platelets 10/17/2016 301  145 - 400 10e3/uL Final  . MCV 10/17/2016 85.5  79.5 - 101.0 fL Final  . MCH 10/17/2016 27.9  25.1 - 34.0 pg Final  . MCHC 10/17/2016 32.6  31.5 - 36.0 g/dL Final  . RBC 10/17/2016 4.20  3.70 - 5.45 10e6/uL Final  . RDW 10/17/2016 17.7* 11.2 - 14.5 % Final  . lymph# 10/17/2016 2.0  0.9 - 3.3 10e3/uL Final  . MONO# 10/17/2016 0.6  0.1 - 0.9 10e3/uL Final  . Eosinophils Absolute 10/17/2016 0.3  0.0 - 0.5 10e3/uL Final  . Basophils Absolute 10/17/2016 0.1  0.0 - 0.1 10e3/uL Final  . NEUT% 10/17/2016 54.0  38.4 - 76.8 % Final  . LYMPH% 10/17/2016 30.8  14.0 - 49.7 % Final  . MONO% 10/17/2016 9.2  0.0 - 14.0 % Final  . EOS% 10/17/2016 5.0  0.0 - 7.0 % Final  . BASO% 10/17/2016 1.0  0.0 - 2.0 % Final  . Sodium 10/17/2016 141  136 - 145 mEq/L Final  . Potassium 10/17/2016 4.1  3.5 - 5.1 mEq/L Final  . Chloride 10/17/2016 105  98 - 109 mEq/L Final  . CO2 10/17/2016 25  22 - 29 mEq/L Final  . Glucose 10/17/2016 123  70 - 140 mg/dl Final  . BUN 10/17/2016 22.9  7.0 -  26.0 mg/dL Final  . Creatinine 10/17/2016 1.1  0.6 - 1.1 mg/dL Final  . Total Bilirubin 10/17/2016 0.34  0.20 - 1.20 mg/dL Final  . Alkaline Phosphatase 10/17/2016 74  40 - 150 U/L Final  . AST 10/17/2016 22  5 - 34 U/L Final  . ALT 10/17/2016 26  0 - 55 U/L Final  . Total Protein 10/17/2016 6.8  6.4 - 8.3 g/dL Final  . Albumin 10/17/2016 3.9  3.5 - 5.0 g/dL Final  . Calcium 10/17/2016 9.9  8.4 - 10.4 mg/dL Final  . Anion Gap 10/17/2016 11  3 - 11 mEq/L Final  . EGFR 10/17/2016 51* >90 ml/min/1.73 m2 Final  Admission on 08/29/2016, Discharged on 09/02/2016  Component Date Value Ref Range Status  . Color, Urine 08/29/2016 YELLOW  YELLOW Final  . APPearance 08/29/2016 HAZY* CLEAR Final  . Specific Gravity, Urine 08/29/2016 1.014  1.005 - 1.030 Final  . pH 08/29/2016 5.0  5.0 - 8.0 Final  . Glucose, UA 08/29/2016 NEGATIVE  NEGATIVE mg/dL Final  . Hgb urine dipstick 08/29/2016 NEGATIVE  NEGATIVE Final  . Bilirubin Urine 08/29/2016 NEGATIVE  NEGATIVE Final  . Ketones, ur 08/29/2016 NEGATIVE  NEGATIVE mg/dL Final  . Protein, ur 08/29/2016 NEGATIVE  NEGATIVE mg/dL Final  . Nitrite 08/29/2016 NEGATIVE  NEGATIVE Final  . Leukocytes, UA 08/29/2016 LARGE* NEGATIVE Final  . RBC / HPF 08/29/2016 0-5  0 - 5 RBC/hpf Final  . WBC, UA 08/29/2016 TOO NUMEROUS TO COUNT  0 - 5 WBC/hpf Final  . Bacteria, UA 08/29/2016 RARE* NONE SEEN Final  . Squamous Epithelial / LPF 08/29/2016 0-5* NONE SEEN Final  . Non Squamous Epithelial 08/29/2016 0-5* NONE SEEN Final  . WBC 08/30/2016 22.6* 4.0 - 10.5 K/uL Final  . RBC 08/30/2016 3.53* 3.87 - 5.11 MIL/uL Final  . Hemoglobin 08/30/2016 9.6* 12.0 - 15.0 g/dL Final  . HCT 08/30/2016 29.0* 36.0 - 46.0 %  Final  . MCV 08/30/2016 82.2  78.0 - 100.0 fL Final  . MCH 08/30/2016 27.2  26.0 - 34.0 pg Final  . MCHC 08/30/2016 33.1  30.0 - 36.0 g/dL Final  . RDW 08/30/2016 15.1  11.5 - 15.5 % Final  . Platelets 08/30/2016 502* 150 - 400 K/uL Final  . WBC 08/29/2016 22.7*  4.0 - 10.5 K/uL Final  . RBC 08/29/2016 3.56* 3.87 - 5.11 MIL/uL Final  . Hemoglobin 08/29/2016 9.6* 12.0 - 15.0 g/dL Final  . HCT 08/29/2016 29.2* 36.0 - 46.0 % Final  . MCV 08/29/2016 82.0  78.0 - 100.0 fL Final  . MCH 08/29/2016 27.0  26.0 - 34.0 pg Final  . MCHC 08/29/2016 32.9  30.0 - 36.0 g/dL Final  . RDW 08/29/2016 15.0  11.5 - 15.5 % Final  . Platelets 08/29/2016 464* 150 - 400 K/uL Final  . Sodium 08/29/2016 129* 135 - 145 mmol/L Final  . Potassium 08/29/2016 3.0* 3.5 - 5.1 mmol/L Final  . Chloride 08/29/2016 94* 101 - 111 mmol/L Final  . CO2 08/29/2016 22  22 - 32 mmol/L Final  . Glucose, Bld 08/29/2016 130* 65 - 99 mg/dL Final  . BUN 08/29/2016 35* 6 - 20 mg/dL Final  . Creatinine, Ser 08/29/2016 1.68* 0.44 - 1.00 mg/dL Final  . Calcium 08/29/2016 9.5  8.9 - 10.3 mg/dL Final  . Total Protein 08/29/2016 5.7* 6.5 - 8.1 g/dL Final  . Albumin 08/29/2016 2.3* 3.5 - 5.0 g/dL Final  . AST 08/29/2016 29  15 - 41 U/L Final  . ALT 08/29/2016 22  14 - 54 U/L Final  . Alkaline Phosphatase 08/29/2016 71  38 - 126 U/L Final  . Total Bilirubin 08/29/2016 0.4  0.3 - 1.2 mg/dL Final  . GFR calc non Af Amer 08/29/2016 29* >60 mL/min Final  . GFR calc Af Amer 08/29/2016 33* >60 mL/min Final   Comment: (NOTE) The eGFR has been calculated using the CKD EPI equation. This calculation has not been validated in all clinical situations. eGFR's persistently <60 mL/min signify possible Chronic Kidney Disease.   . Anion gap 08/29/2016 13  5 - 15 Final  . Sodium 08/30/2016 129* 135 - 145 mmol/L Final  . Potassium 08/30/2016 2.8* 3.5 - 5.1 mmol/L Final  . Chloride 08/30/2016 94* 101 - 111 mmol/L Final  . CO2 08/30/2016 25  22 - 32 mmol/L Final  . Glucose, Bld 08/30/2016 150* 65 - 99 mg/dL Final  . BUN 08/30/2016 34* 6 - 20 mg/dL Final  . Creatinine, Ser 08/30/2016 1.61* 0.44 - 1.00 mg/dL Final  . Calcium 08/30/2016 9.3  8.9 - 10.3 mg/dL Final  . GFR calc non Af Amer 08/30/2016 30* >60 mL/min  Final  . GFR calc Af Amer 08/30/2016 35* >60 mL/min Final   Comment: (NOTE) The eGFR has been calculated using the CKD EPI equation. This calculation has not been validated in all clinical situations. eGFR's persistently <60 mL/min signify possible Chronic Kidney Disease.   . Anion gap 08/30/2016 10  5 - 15 Final  . Magnesium 08/30/2016 1.9  1.7 - 2.4 mg/dL Final  . TSH 08/30/2016 2.062  0.350 - 4.500 uIU/mL Final  . BP 08/30/2016 107/62  mmHg Final  . WBC 08/31/2016 14.6* 4.0 - 10.5 K/uL Final  . RBC 08/31/2016 3.67* 3.87 - 5.11 MIL/uL Final  . Hemoglobin 08/31/2016 9.8* 12.0 - 15.0 g/dL Final  . HCT 08/31/2016 30.4* 36.0 - 46.0 % Final  . MCV 08/31/2016 82.8  78.0 -  100.0 fL Final  . MCH 08/31/2016 26.7  26.0 - 34.0 pg Final  . MCHC 08/31/2016 32.2  30.0 - 36.0 g/dL Final  . RDW 08/31/2016 15.5  11.5 - 15.5 % Final  . Platelets 08/31/2016 598* 150 - 400 K/uL Final  . Sodium 08/31/2016 139  135 - 145 mmol/L Final  . Potassium 08/31/2016 3.8  3.5 - 5.1 mmol/L Final  . Chloride 08/31/2016 106  101 - 111 mmol/L Final  . CO2 08/31/2016 22  22 - 32 mmol/L Final  . Glucose, Bld 08/31/2016 130* 65 - 99 mg/dL Final  . BUN 08/31/2016 18  6 - 20 mg/dL Final  . Creatinine, Ser 08/31/2016 1.05* 0.44 - 1.00 mg/dL Final  . Calcium 08/31/2016 9.7  8.9 - 10.3 mg/dL Final  . GFR calc non Af Amer 08/31/2016 51* >60 mL/min Final  . GFR calc Af Amer 08/31/2016 59* >60 mL/min Final   Comment: (NOTE) The eGFR has been calculated using the CKD EPI equation. This calculation has not been validated in all clinical situations. eGFR's persistently <60 mL/min signify possible Chronic Kidney Disease.   . Anion gap 08/31/2016 11  5 - 15 Final  . WBC 09/01/2016 14.0* 4.0 - 10.5 K/uL Final  . RBC 09/01/2016 3.45* 3.87 - 5.11 MIL/uL Final  . Hemoglobin 09/01/2016 9.2* 12.0 - 15.0 g/dL Final  . HCT 09/01/2016 28.7* 36.0 - 46.0 % Final  . MCV 09/01/2016 83.2  78.0 - 100.0 fL Final  . MCH 09/01/2016 26.7   26.0 - 34.0 pg Final  . MCHC 09/01/2016 32.1  30.0 - 36.0 g/dL Final  . RDW 09/01/2016 15.6* 11.5 - 15.5 % Final  . Platelets 09/01/2016 505* 150 - 400 K/uL Final  . Sodium 09/01/2016 138  135 - 145 mmol/L Final  . Potassium 09/01/2016 3.4* 3.5 - 5.1 mmol/L Final  . Chloride 09/01/2016 108  101 - 111 mmol/L Final  . CO2 09/01/2016 21* 22 - 32 mmol/L Final  . Glucose, Bld 09/01/2016 124* 65 - 99 mg/dL Final  . BUN 09/01/2016 12  6 - 20 mg/dL Final  . Creatinine, Ser 09/01/2016 0.93  0.44 - 1.00 mg/dL Final  . Calcium 09/01/2016 9.4  8.9 - 10.3 mg/dL Final  . GFR calc non Af Amer 09/01/2016 59* >60 mL/min Final  . GFR calc Af Amer 09/01/2016 >60  >60 mL/min Final   Comment: (NOTE) The eGFR has been calculated using the CKD EPI equation. This calculation has not been validated in all clinical situations. eGFR's persistently <60 mL/min signify possible Chronic Kidney Disease.   . Anion gap 09/01/2016 9  5 - 15 Final  . Sodium 09/02/2016 139  135 - 145 mmol/L Final  . Potassium 09/02/2016 3.6  3.5 - 5.1 mmol/L Final  . Chloride 09/02/2016 107  101 - 111 mmol/L Final  . CO2 09/02/2016 24  22 - 32 mmol/L Final  . Glucose, Bld 09/02/2016 118* 65 - 99 mg/dL Final  . BUN 09/02/2016 7  6 - 20 mg/dL Final  . Creatinine, Ser 09/02/2016 0.79  0.44 - 1.00 mg/dL Final  . Calcium 09/02/2016 9.1  8.9 - 10.3 mg/dL Final  . GFR calc non Af Amer 09/02/2016 >60  >60 mL/min Final  . GFR calc Af Amer 09/02/2016 >60  >60 mL/min Final   Comment: (NOTE) The eGFR has been calculated using the CKD EPI equation. This calculation has not been validated in all clinical situations. eGFR's persistently <60 mL/min signify possible Chronic Kidney Disease.   Marland Kitchen  Anion gap 09/02/2016 8  5 - 15 Final  . WBC 09/02/2016 14.7* 4.0 - 10.5 K/uL Final  . RBC 09/02/2016 3.19* 3.87 - 5.11 MIL/uL Final  . Hemoglobin 09/02/2016 8.6* 12.0 - 15.0 g/dL Final  . HCT 09/02/2016 26.7* 36.0 - 46.0 % Final  . MCV 09/02/2016 83.7   78.0 - 100.0 fL Final  . MCH 09/02/2016 27.0  26.0 - 34.0 pg Final  . MCHC 09/02/2016 32.2  30.0 - 36.0 g/dL Final  . RDW 09/02/2016 15.8* 11.5 - 15.5 % Final  . Platelets 09/02/2016 482* 150 - 400 K/uL Final  Admission on 08/02/2016, Discharged on 08/03/2016  Component Date Value Ref Range Status  . Sodium 08/02/2016 136  135 - 145 mmol/L Final  . Potassium 08/02/2016 3.2* 3.5 - 5.1 mmol/L Final  . Chloride 08/02/2016 101  101 - 111 mmol/L Final  . CO2 08/02/2016 26  22 - 32 mmol/L Final  . Glucose, Bld 08/02/2016 127* 65 - 99 mg/dL Final  . BUN 08/02/2016 15  6 - 20 mg/dL Final  . Creatinine, Ser 08/02/2016 1.52* 0.44 - 1.00 mg/dL Final  . Calcium 08/02/2016 9.7  8.9 - 10.3 mg/dL Final  . GFR calc non Af Amer 08/02/2016 33* >60 mL/min Final  . GFR calc Af Amer 08/02/2016 38* >60 mL/min Final   Comment: (NOTE) The eGFR has been calculated using the CKD EPI equation. This calculation has not been validated in all clinical situations. eGFR's persistently <60 mL/min signify possible Chronic Kidney Disease.   . Anion gap 08/02/2016 9  5 - 15 Final  . WBC 08/02/2016 9.7  4.0 - 10.5 K/uL Final  . RBC 08/02/2016 3.68* 3.87 - 5.11 MIL/uL Final  . Hemoglobin 08/02/2016 10.1* 12.0 - 15.0 g/dL Final  . HCT 08/02/2016 32.2* 36.0 - 46.0 % Final  . MCV 08/02/2016 87.5  78.0 - 100.0 fL Final  . MCH 08/02/2016 27.4  26.0 - 34.0 pg Final  . MCHC 08/02/2016 31.4  30.0 - 36.0 g/dL Final  . RDW 08/02/2016 14.1  11.5 - 15.5 % Final  . Platelets 08/02/2016 328  150 - 400 K/uL Final  . Sodium 08/03/2016 134* 135 - 145 mmol/L Final  . Potassium 08/03/2016 4.0  3.5 - 5.1 mmol/L Final  . Chloride 08/03/2016 100* 101 - 111 mmol/L Final  . CO2 08/03/2016 25  22 - 32 mmol/L Final  . Glucose, Bld 08/03/2016 145* 65 - 99 mg/dL Final  . BUN 08/03/2016 15  6 - 20 mg/dL Final  . Creatinine, Ser 08/03/2016 1.38* 0.44 - 1.00 mg/dL Final  . Calcium 08/03/2016 9.4  8.9 - 10.3 mg/dL Final  . GFR calc non Af Amer  08/03/2016 37* >60 mL/min Final  . GFR calc Af Amer 08/03/2016 42* >60 mL/min Final   Comment: (NOTE) The eGFR has been calculated using the CKD EPI equation. This calculation has not been validated in all clinical situations. eGFR's persistently <60 mL/min signify possible Chronic Kidney Disease.   . Anion gap 08/03/2016 9  5 - 15 Final  . WBC 08/03/2016 16.2* 4.0 - 10.5 K/uL Final  . RBC 08/03/2016 3.28* 3.87 - 5.11 MIL/uL Final  . Hemoglobin 08/03/2016 9.1* 12.0 - 15.0 g/dL Final  . HCT 08/03/2016 28.4* 36.0 - 46.0 % Final  . MCV 08/03/2016 86.6  78.0 - 100.0 fL Final  . MCH 08/03/2016 27.7  26.0 - 34.0 pg Final  . MCHC 08/03/2016 32.0  30.0 - 36.0 g/dL Final  . RDW 08/03/2016  14.3  11.5 - 15.5 % Final  . Platelets 08/03/2016 326  150 - 400 K/uL Final  Abstract on 07/27/2016  Component Date Value Ref Range Status  . Hemoglobin 01/20/2016 12.3  12.0 - 16.0 g/dL Final  . HCT 01/20/2016 37  36 - 46 % Final  . Platelets 01/20/2016 307  150 - 399 K/L Final  . WBC 01/20/2016 6.0  10^3/mL Final  . Glucose 01/20/2016 95  mg/dL Final  . BUN 01/20/2016 23* 4 - 21 mg/dL Final  . Creatinine 01/20/2016 1.2* 0.5 - 1.1 mg/dL Final  . Potassium 01/20/2016 4.1  3.4 - 5.3 mmol/L Final  . Sodium 01/20/2016 138  137 - 147 mmol/L Final  . Alkaline Phosphatase 01/20/2016 68  25 - 125 U/L Final  . ALT 01/20/2016 28  7 - 35 U/L Final  . AST 01/20/2016 22  13 - 35 U/L Final  . Bilirubin, Total 01/20/2016 0.4  mg/dL Final  Admission on 07/11/2016, Discharged on 07/12/2016  Component Date Value Ref Range Status  . WBC 07/11/2016 7.7  4.0 - 10.5 K/uL Final  . RBC 07/11/2016 3.72* 3.87 - 5.11 MIL/uL Final  . Hemoglobin 07/11/2016 10.5* 12.0 - 15.0 g/dL Final  . HCT 07/11/2016 32.7* 36.0 - 46.0 % Final  . MCV 07/11/2016 87.9  78.0 - 100.0 fL Final  . MCH 07/11/2016 28.2  26.0 - 34.0 pg Final  . MCHC 07/11/2016 32.1  30.0 - 36.0 g/dL Final  . RDW 07/11/2016 13.2  11.5 - 15.5 % Final  . Platelets  07/11/2016 345  150 - 400 K/uL Final  . Sodium 07/11/2016 134* 135 - 145 mmol/L Final  . Potassium 07/11/2016 3.9  3.5 - 5.1 mmol/L Final  . Chloride 07/11/2016 98* 101 - 111 mmol/L Final  . CO2 07/11/2016 24  22 - 32 mmol/L Final  . Glucose, Bld 07/11/2016 124* 65 - 99 mg/dL Final  . BUN 07/11/2016 17  6 - 20 mg/dL Final  . Creatinine, Ser 07/11/2016 1.38* 0.44 - 1.00 mg/dL Final  . Calcium 07/11/2016 9.3  8.9 - 10.3 mg/dL Final  . GFR calc non Af Amer 07/11/2016 37* >60 mL/min Final  . GFR calc Af Amer 07/11/2016 42* >60 mL/min Final   Comment: (NOTE) The eGFR has been calculated using the CKD EPI equation. This calculation has not been validated in all clinical situations. eGFR's persistently <60 mL/min signify possible Chronic Kidney Disease.   . Anion gap 07/11/2016 12  5 - 15 Final  Admission on 06/25/2016, Discharged on 06/26/2016  Component Date Value Ref Range Status  . Sodium 06/25/2016 137  135 - 145 mmol/L Final  . Potassium 06/25/2016 3.8  3.5 - 5.1 mmol/L Final  . Chloride 06/25/2016 101  101 - 111 mmol/L Final  . CO2 06/25/2016 26  22 - 32 mmol/L Final  . Glucose, Bld 06/25/2016 104* 65 - 99 mg/dL Final  . BUN 06/25/2016 18  6 - 20 mg/dL Final  . Creatinine, Ser 06/25/2016 1.12* 0.44 - 1.00 mg/dL Final  . Calcium 06/25/2016 9.5  8.9 - 10.3 mg/dL Final  . GFR calc non Af Amer 06/25/2016 47* >60 mL/min Final  . GFR calc Af Amer 06/25/2016 55* >60 mL/min Final   Comment: (NOTE) The eGFR has been calculated using the CKD EPI equation. This calculation has not been validated in all clinical situations. eGFR's persistently <60 mL/min signify possible Chronic Kidney Disease.   . Anion gap 06/25/2016 10  5 - 15 Final  .  WBC 06/25/2016 7.3  4.0 - 10.5 K/uL Final  . RBC 06/25/2016 4.23  3.87 - 5.11 MIL/uL Final  . Hemoglobin 06/25/2016 12.3  12.0 - 15.0 g/dL Final  . HCT 06/25/2016 38.8  36.0 - 46.0 % Final  . MCV 06/25/2016 91.7  78.0 - 100.0 fL Final  . MCH 06/25/2016  29.1  26.0 - 34.0 pg Final  . MCHC 06/25/2016 31.7  30.0 - 36.0 g/dL Final  . RDW 06/25/2016 13.4  11.5 - 15.5 % Final  . Platelets 06/25/2016 295  150 - 400 K/uL Final  Appointment on 05/31/2016  Component Date Value Ref Range Status  . WBC 05/31/2016 9.8  3.9 - 10.3 10e3/uL Final  . NEUT# 05/31/2016 6.5  1.5 - 6.5 10e3/uL Final  . HGB 05/31/2016 12.9  11.6 - 15.9 g/dL Final  . HCT 05/31/2016 39.6  34.8 - 46.6 % Final  . Platelets 05/31/2016 310  145 - 400 10e3/uL Final  . MCV 05/31/2016 88.8  79.5 - 101.0 fL Final  . MCH 05/31/2016 29.0  25.1 - 34.0 pg Final  . MCHC 05/31/2016 32.7  31.5 - 36.0 g/dL Final  . RBC 05/31/2016 4.45  3.70 - 5.45 10e6/uL Final  . RDW 05/31/2016 13.4  11.2 - 14.5 % Final  . lymph# 05/31/2016 1.9  0.9 - 3.3 10e3/uL Final  . MONO# 05/31/2016 1.1* 0.1 - 0.9 10e3/uL Final  . Eosinophils Absolute 05/31/2016 0.3  0.0 - 0.5 10e3/uL Final  . Basophils Absolute 05/31/2016 0.1  0.0 - 0.1 10e3/uL Final  . NEUT% 05/31/2016 65.6  38.4 - 76.8 % Final  . LYMPH% 05/31/2016 19.6  14.0 - 49.7 % Final  . MONO% 05/31/2016 10.7  0.0 - 14.0 % Final  . EOS% 05/31/2016 3.3  0.0 - 7.0 % Final  . BASO% 05/31/2016 0.8  0.0 - 2.0 % Final  . Sodium 05/31/2016 137  136 - 145 mEq/L Final  . Potassium 05/31/2016 4.1  3.5 - 5.1 mEq/L Final  . Chloride 05/31/2016 102  98 - 109 mEq/L Final  . CO2 05/31/2016 24  22 - 29 mEq/L Final  . Glucose 05/31/2016 94  70 - 140 mg/dl Final  . BUN 05/31/2016 22.9  7.0 - 26.0 mg/dL Final  . Creatinine 05/31/2016 1.3* 0.6 - 1.1 mg/dL Final  . Total Bilirubin 05/31/2016 <0.30  0.20 - 1.20 mg/dL Final  . Alkaline Phosphatase 05/31/2016 79  40 - 150 U/L Final  . AST 05/31/2016 20  5 - 34 U/L Final  . ALT 05/31/2016 30  0 - 55 U/L Final  . Total Protein 05/31/2016 6.9  6.4 - 8.3 g/dL Final  . Albumin 05/31/2016 3.7  3.5 - 5.0 g/dL Final  . Calcium 05/31/2016 9.6  8.4 - 10.4 mg/dL Final  . Anion Gap 05/31/2016 10  3 - 11 mEq/L Final  . EGFR 05/31/2016  40* >90 ml/min/1.73 m2 Final     Imaging: No results found.  Speciality Comments: No specialty comments available.    Procedures:  No procedures performed Allergies: Morphine and related   Assessment / Plan:     Visit Diagnoses: Inflammatory arthritis  Primary osteoarthritis of both hands  Primary osteoarthritis of both feet  Primary osteoarthritis of both knees  Status post total bilateral knee replacement  Primary osteoarthritis of both hips  DDD (degenerative disc disease), lumbar  History of gastroesophageal reflux (GERD)   Plan: #1: Inflammatory arthritis. No synovitis on examination. Has been off of Plaquenil since August 2017 and plans  to restart it now. She has been cleared by her surgeon for this.  #2: High risk prescription. Was on Plaquenil 200 mg twice a day Monday through Friday. Has been off of it since August 2017 and we'll started now. She needs more Plaquenil and we have refilled it for 90 day supply with a refill.  #3: Patient has lost weight due to dieting and exercise.  #4: Return to clinic in 5 months  #5: When we need to pick repeat the patient's blood work, we will add uric acid to the CBC with differential and CMP with GFR. On the May 2017 visit, Dr. Estanislado Pandy ordered uric acid on a future lab but it has not been done yet. Because of patient's history of inflammatory arthritis and OA of the hands, we wanted to make sure there was no gout as an associated diagnosis that would need to be treated. Currently the patient is doing well with her joints so I don't suspect the gout to be an issue but we should rule it out and establish that her uric acid levels are normal. Patient's next labs will be due in 5 months and we will do them during the time of the next office visit.  Orders: No orders of the defined types were placed in this encounter.  No orders of the defined types were placed in this encounter.   Face-to-face time spent with patient  was 30 minutes. 50% of time was spent in counseling and coordination of care.  Follow-Up Instructions: No Follow-up on file.   Amy Littrell, RT  Note - This record has been created using [employer].  Chart creation errors have been sought, but may not always  have been located. Such creation errors do not reflect on  the standard of medical care.

## 2016-10-29 ENCOUNTER — Encounter: Payer: Self-pay | Admitting: Rheumatology

## 2016-10-29 ENCOUNTER — Ambulatory Visit (INDEPENDENT_AMBULATORY_CARE_PROVIDER_SITE_OTHER): Payer: Medicare Other | Admitting: Rheumatology

## 2016-10-29 VITALS — BP 128/70 | HR 72 | Resp 16 | Ht 60.0 in | Wt 256.0 lb

## 2016-10-29 DIAGNOSIS — Z96653 Presence of artificial knee joint, bilateral: Secondary | ICD-10-CM

## 2016-10-29 DIAGNOSIS — M160 Bilateral primary osteoarthritis of hip: Secondary | ICD-10-CM | POA: Diagnosis not present

## 2016-10-29 DIAGNOSIS — M170 Bilateral primary osteoarthritis of knee: Secondary | ICD-10-CM

## 2016-10-29 DIAGNOSIS — M1990 Unspecified osteoarthritis, unspecified site: Secondary | ICD-10-CM

## 2016-10-29 DIAGNOSIS — M19072 Primary osteoarthritis, left ankle and foot: Secondary | ICD-10-CM

## 2016-10-29 DIAGNOSIS — M19071 Primary osteoarthritis, right ankle and foot: Secondary | ICD-10-CM | POA: Diagnosis not present

## 2016-10-29 DIAGNOSIS — M5136 Other intervertebral disc degeneration, lumbar region: Secondary | ICD-10-CM | POA: Diagnosis not present

## 2016-10-29 DIAGNOSIS — F5101 Primary insomnia: Secondary | ICD-10-CM

## 2016-10-29 DIAGNOSIS — Z170 Estrogen receptor positive status [ER+]: Secondary | ICD-10-CM

## 2016-10-29 DIAGNOSIS — M19041 Primary osteoarthritis, right hand: Secondary | ICD-10-CM | POA: Diagnosis not present

## 2016-10-29 DIAGNOSIS — M19042 Primary osteoarthritis, left hand: Secondary | ICD-10-CM

## 2016-10-29 DIAGNOSIS — Z79899 Other long term (current) drug therapy: Secondary | ICD-10-CM

## 2016-10-29 DIAGNOSIS — Z8719 Personal history of other diseases of the digestive system: Secondary | ICD-10-CM | POA: Diagnosis not present

## 2016-10-29 DIAGNOSIS — C50212 Malignant neoplasm of upper-inner quadrant of left female breast: Secondary | ICD-10-CM | POA: Diagnosis not present

## 2016-10-29 MED ORDER — HYDROXYCHLOROQUINE SULFATE 200 MG PO TABS: 200.0000 mg | ORAL_TABLET | Freq: Two times a day (BID) | ORAL | 1 refills | Status: DC

## 2016-10-30 DIAGNOSIS — M069 Rheumatoid arthritis, unspecified: Secondary | ICD-10-CM | POA: Diagnosis not present

## 2016-10-30 DIAGNOSIS — D649 Anemia, unspecified: Secondary | ICD-10-CM | POA: Diagnosis not present

## 2016-10-30 DIAGNOSIS — I129 Hypertensive chronic kidney disease with stage 1 through stage 4 chronic kidney disease, or unspecified chronic kidney disease: Secondary | ICD-10-CM | POA: Diagnosis not present

## 2016-10-30 DIAGNOSIS — I4891 Unspecified atrial fibrillation: Secondary | ICD-10-CM | POA: Diagnosis not present

## 2016-10-30 DIAGNOSIS — T8189XD Other complications of procedures, not elsewhere classified, subsequent encounter: Secondary | ICD-10-CM | POA: Diagnosis not present

## 2016-10-30 DIAGNOSIS — N183 Chronic kidney disease, stage 3 (moderate): Secondary | ICD-10-CM | POA: Diagnosis not present

## 2016-11-01 DIAGNOSIS — I10 Essential (primary) hypertension: Secondary | ICD-10-CM | POA: Diagnosis not present

## 2016-11-01 DIAGNOSIS — N390 Urinary tract infection, site not specified: Secondary | ICD-10-CM | POA: Diagnosis not present

## 2016-11-01 DIAGNOSIS — G894 Chronic pain syndrome: Secondary | ICD-10-CM | POA: Diagnosis not present

## 2016-11-01 DIAGNOSIS — F322 Major depressive disorder, single episode, severe without psychotic features: Secondary | ICD-10-CM | POA: Diagnosis not present

## 2016-11-01 DIAGNOSIS — Z6841 Body Mass Index (BMI) 40.0 and over, adult: Secondary | ICD-10-CM | POA: Diagnosis not present

## 2016-11-01 DIAGNOSIS — M1990 Unspecified osteoarthritis, unspecified site: Secondary | ICD-10-CM | POA: Diagnosis not present

## 2016-11-01 DIAGNOSIS — F419 Anxiety disorder, unspecified: Secondary | ICD-10-CM | POA: Diagnosis not present

## 2016-11-01 DIAGNOSIS — Z8679 Personal history of other diseases of the circulatory system: Secondary | ICD-10-CM | POA: Diagnosis not present

## 2016-11-01 DIAGNOSIS — Z1389 Encounter for screening for other disorder: Secondary | ICD-10-CM | POA: Diagnosis not present

## 2016-11-01 DIAGNOSIS — C50919 Malignant neoplasm of unspecified site of unspecified female breast: Secondary | ICD-10-CM | POA: Diagnosis not present

## 2016-11-01 DIAGNOSIS — E559 Vitamin D deficiency, unspecified: Secondary | ICD-10-CM | POA: Diagnosis not present

## 2016-11-01 DIAGNOSIS — F329 Major depressive disorder, single episode, unspecified: Secondary | ICD-10-CM | POA: Diagnosis not present

## 2016-11-01 DIAGNOSIS — Z0000 Encounter for general adult medical examination without abnormal findings: Secondary | ICD-10-CM | POA: Diagnosis not present

## 2016-11-01 DIAGNOSIS — G4700 Insomnia, unspecified: Secondary | ICD-10-CM | POA: Diagnosis not present

## 2016-11-02 DIAGNOSIS — Z9181 History of falling: Secondary | ICD-10-CM | POA: Diagnosis not present

## 2016-11-02 DIAGNOSIS — M069 Rheumatoid arthritis, unspecified: Secondary | ICD-10-CM | POA: Diagnosis not present

## 2016-11-02 DIAGNOSIS — F419 Anxiety disorder, unspecified: Secondary | ICD-10-CM | POA: Diagnosis not present

## 2016-11-02 DIAGNOSIS — F329 Major depressive disorder, single episode, unspecified: Secondary | ICD-10-CM | POA: Diagnosis not present

## 2016-11-02 DIAGNOSIS — N183 Chronic kidney disease, stage 3 (moderate): Secondary | ICD-10-CM | POA: Diagnosis not present

## 2016-11-02 DIAGNOSIS — I129 Hypertensive chronic kidney disease with stage 1 through stage 4 chronic kidney disease, or unspecified chronic kidney disease: Secondary | ICD-10-CM | POA: Diagnosis not present

## 2016-11-02 DIAGNOSIS — I4891 Unspecified atrial fibrillation: Secondary | ICD-10-CM | POA: Diagnosis not present

## 2016-11-02 DIAGNOSIS — D649 Anemia, unspecified: Secondary | ICD-10-CM | POA: Diagnosis not present

## 2016-11-05 ENCOUNTER — Other Ambulatory Visit: Payer: Self-pay | Admitting: Oncology

## 2016-11-06 DIAGNOSIS — F329 Major depressive disorder, single episode, unspecified: Secondary | ICD-10-CM | POA: Diagnosis not present

## 2016-11-06 DIAGNOSIS — N183 Chronic kidney disease, stage 3 (moderate): Secondary | ICD-10-CM | POA: Diagnosis not present

## 2016-11-06 DIAGNOSIS — D649 Anemia, unspecified: Secondary | ICD-10-CM | POA: Diagnosis not present

## 2016-11-06 DIAGNOSIS — I129 Hypertensive chronic kidney disease with stage 1 through stage 4 chronic kidney disease, or unspecified chronic kidney disease: Secondary | ICD-10-CM | POA: Diagnosis not present

## 2016-11-06 DIAGNOSIS — M069 Rheumatoid arthritis, unspecified: Secondary | ICD-10-CM | POA: Diagnosis not present

## 2016-11-06 DIAGNOSIS — I4891 Unspecified atrial fibrillation: Secondary | ICD-10-CM | POA: Diagnosis not present

## 2016-11-07 DIAGNOSIS — H353133 Nonexudative age-related macular degeneration, bilateral, advanced atrophic without subfoveal involvement: Secondary | ICD-10-CM | POA: Diagnosis not present

## 2016-11-07 DIAGNOSIS — Z79899 Other long term (current) drug therapy: Secondary | ICD-10-CM | POA: Diagnosis not present

## 2016-11-07 DIAGNOSIS — H353211 Exudative age-related macular degeneration, right eye, with active choroidal neovascularization: Secondary | ICD-10-CM | POA: Diagnosis not present

## 2016-11-07 DIAGNOSIS — H2511 Age-related nuclear cataract, right eye: Secondary | ICD-10-CM | POA: Diagnosis not present

## 2016-11-08 DIAGNOSIS — I4891 Unspecified atrial fibrillation: Secondary | ICD-10-CM | POA: Diagnosis not present

## 2016-11-08 DIAGNOSIS — N183 Chronic kidney disease, stage 3 (moderate): Secondary | ICD-10-CM | POA: Diagnosis not present

## 2016-11-08 DIAGNOSIS — M069 Rheumatoid arthritis, unspecified: Secondary | ICD-10-CM | POA: Diagnosis not present

## 2016-11-08 DIAGNOSIS — I129 Hypertensive chronic kidney disease with stage 1 through stage 4 chronic kidney disease, or unspecified chronic kidney disease: Secondary | ICD-10-CM | POA: Diagnosis not present

## 2016-11-08 DIAGNOSIS — D649 Anemia, unspecified: Secondary | ICD-10-CM | POA: Diagnosis not present

## 2016-11-08 DIAGNOSIS — F329 Major depressive disorder, single episode, unspecified: Secondary | ICD-10-CM | POA: Diagnosis not present

## 2016-11-12 DIAGNOSIS — F329 Major depressive disorder, single episode, unspecified: Secondary | ICD-10-CM | POA: Diagnosis not present

## 2016-11-12 DIAGNOSIS — M069 Rheumatoid arthritis, unspecified: Secondary | ICD-10-CM | POA: Diagnosis not present

## 2016-11-12 DIAGNOSIS — D649 Anemia, unspecified: Secondary | ICD-10-CM | POA: Diagnosis not present

## 2016-11-12 DIAGNOSIS — N183 Chronic kidney disease, stage 3 (moderate): Secondary | ICD-10-CM | POA: Diagnosis not present

## 2016-11-12 DIAGNOSIS — I129 Hypertensive chronic kidney disease with stage 1 through stage 4 chronic kidney disease, or unspecified chronic kidney disease: Secondary | ICD-10-CM | POA: Diagnosis not present

## 2016-11-12 DIAGNOSIS — I4891 Unspecified atrial fibrillation: Secondary | ICD-10-CM | POA: Diagnosis not present

## 2016-11-15 DIAGNOSIS — M069 Rheumatoid arthritis, unspecified: Secondary | ICD-10-CM | POA: Diagnosis not present

## 2016-11-15 DIAGNOSIS — D649 Anemia, unspecified: Secondary | ICD-10-CM | POA: Diagnosis not present

## 2016-11-15 DIAGNOSIS — I4891 Unspecified atrial fibrillation: Secondary | ICD-10-CM | POA: Diagnosis not present

## 2016-11-15 DIAGNOSIS — N183 Chronic kidney disease, stage 3 (moderate): Secondary | ICD-10-CM | POA: Diagnosis not present

## 2016-11-15 DIAGNOSIS — I129 Hypertensive chronic kidney disease with stage 1 through stage 4 chronic kidney disease, or unspecified chronic kidney disease: Secondary | ICD-10-CM | POA: Diagnosis not present

## 2016-11-15 DIAGNOSIS — F329 Major depressive disorder, single episode, unspecified: Secondary | ICD-10-CM | POA: Diagnosis not present

## 2016-11-19 DIAGNOSIS — H353133 Nonexudative age-related macular degeneration, bilateral, advanced atrophic without subfoveal involvement: Secondary | ICD-10-CM | POA: Diagnosis not present

## 2016-11-19 DIAGNOSIS — H353211 Exudative age-related macular degeneration, right eye, with active choroidal neovascularization: Secondary | ICD-10-CM | POA: Diagnosis not present

## 2016-11-20 DIAGNOSIS — M069 Rheumatoid arthritis, unspecified: Secondary | ICD-10-CM | POA: Diagnosis not present

## 2016-11-20 DIAGNOSIS — D649 Anemia, unspecified: Secondary | ICD-10-CM | POA: Diagnosis not present

## 2016-11-20 DIAGNOSIS — N183 Chronic kidney disease, stage 3 (moderate): Secondary | ICD-10-CM | POA: Diagnosis not present

## 2016-11-20 DIAGNOSIS — I129 Hypertensive chronic kidney disease with stage 1 through stage 4 chronic kidney disease, or unspecified chronic kidney disease: Secondary | ICD-10-CM | POA: Diagnosis not present

## 2016-11-20 DIAGNOSIS — F329 Major depressive disorder, single episode, unspecified: Secondary | ICD-10-CM | POA: Diagnosis not present

## 2016-11-20 DIAGNOSIS — I4891 Unspecified atrial fibrillation: Secondary | ICD-10-CM | POA: Diagnosis not present

## 2016-11-21 DIAGNOSIS — M069 Rheumatoid arthritis, unspecified: Secondary | ICD-10-CM | POA: Diagnosis not present

## 2016-11-21 DIAGNOSIS — N183 Chronic kidney disease, stage 3 (moderate): Secondary | ICD-10-CM | POA: Diagnosis not present

## 2016-11-21 DIAGNOSIS — D649 Anemia, unspecified: Secondary | ICD-10-CM | POA: Diagnosis not present

## 2016-11-21 DIAGNOSIS — I129 Hypertensive chronic kidney disease with stage 1 through stage 4 chronic kidney disease, or unspecified chronic kidney disease: Secondary | ICD-10-CM | POA: Diagnosis not present

## 2016-11-21 DIAGNOSIS — I4891 Unspecified atrial fibrillation: Secondary | ICD-10-CM | POA: Diagnosis not present

## 2016-11-21 DIAGNOSIS — F329 Major depressive disorder, single episode, unspecified: Secondary | ICD-10-CM | POA: Diagnosis not present

## 2016-11-22 DIAGNOSIS — N641 Fat necrosis of breast: Secondary | ICD-10-CM | POA: Diagnosis not present

## 2016-11-22 DIAGNOSIS — Z853 Personal history of malignant neoplasm of breast: Secondary | ICD-10-CM | POA: Diagnosis not present

## 2016-11-26 DIAGNOSIS — G894 Chronic pain syndrome: Secondary | ICD-10-CM | POA: Diagnosis not present

## 2016-11-27 DIAGNOSIS — M069 Rheumatoid arthritis, unspecified: Secondary | ICD-10-CM | POA: Diagnosis not present

## 2016-11-27 DIAGNOSIS — D649 Anemia, unspecified: Secondary | ICD-10-CM | POA: Diagnosis not present

## 2016-11-27 DIAGNOSIS — I4891 Unspecified atrial fibrillation: Secondary | ICD-10-CM | POA: Diagnosis not present

## 2016-11-27 DIAGNOSIS — N183 Chronic kidney disease, stage 3 (moderate): Secondary | ICD-10-CM | POA: Diagnosis not present

## 2016-11-27 DIAGNOSIS — I129 Hypertensive chronic kidney disease with stage 1 through stage 4 chronic kidney disease, or unspecified chronic kidney disease: Secondary | ICD-10-CM | POA: Diagnosis not present

## 2016-11-27 DIAGNOSIS — F329 Major depressive disorder, single episode, unspecified: Secondary | ICD-10-CM | POA: Diagnosis not present

## 2016-11-29 DIAGNOSIS — D649 Anemia, unspecified: Secondary | ICD-10-CM | POA: Diagnosis not present

## 2016-11-29 DIAGNOSIS — F329 Major depressive disorder, single episode, unspecified: Secondary | ICD-10-CM | POA: Diagnosis not present

## 2016-11-29 DIAGNOSIS — I4891 Unspecified atrial fibrillation: Secondary | ICD-10-CM | POA: Diagnosis not present

## 2016-11-29 DIAGNOSIS — M069 Rheumatoid arthritis, unspecified: Secondary | ICD-10-CM | POA: Diagnosis not present

## 2016-11-29 DIAGNOSIS — N183 Chronic kidney disease, stage 3 (moderate): Secondary | ICD-10-CM | POA: Diagnosis not present

## 2016-11-29 DIAGNOSIS — I129 Hypertensive chronic kidney disease with stage 1 through stage 4 chronic kidney disease, or unspecified chronic kidney disease: Secondary | ICD-10-CM | POA: Diagnosis not present

## 2016-12-20 DIAGNOSIS — H353211 Exudative age-related macular degeneration, right eye, with active choroidal neovascularization: Secondary | ICD-10-CM | POA: Diagnosis not present

## 2017-01-11 ENCOUNTER — Other Ambulatory Visit: Payer: Self-pay

## 2017-01-11 DIAGNOSIS — Z170 Estrogen receptor positive status [ER+]: Principal | ICD-10-CM

## 2017-01-11 DIAGNOSIS — C50212 Malignant neoplasm of upper-inner quadrant of left female breast: Secondary | ICD-10-CM

## 2017-01-14 ENCOUNTER — Other Ambulatory Visit (HOSPITAL_BASED_OUTPATIENT_CLINIC_OR_DEPARTMENT_OTHER): Payer: Medicare Other

## 2017-01-14 ENCOUNTER — Encounter: Payer: Self-pay | Admitting: Oncology

## 2017-01-14 ENCOUNTER — Ambulatory Visit (HOSPITAL_BASED_OUTPATIENT_CLINIC_OR_DEPARTMENT_OTHER): Payer: Medicare Other | Admitting: Oncology

## 2017-01-14 VITALS — BP 159/73 | HR 79 | Temp 97.8°F | Resp 18 | Ht 60.0 in | Wt 259.3 lb

## 2017-01-14 DIAGNOSIS — Z170 Estrogen receptor positive status [ER+]: Secondary | ICD-10-CM

## 2017-01-14 DIAGNOSIS — C50212 Malignant neoplasm of upper-inner quadrant of left female breast: Secondary | ICD-10-CM

## 2017-01-14 LAB — CBC WITH DIFFERENTIAL/PLATELET
BASO%: 0.4 % (ref 0.0–2.0)
BASOS ABS: 0 10*3/uL (ref 0.0–0.1)
EOS%: 2.9 % (ref 0.0–7.0)
Eosinophils Absolute: 0.1 10*3/uL (ref 0.0–0.5)
HCT: 37.8 % (ref 34.8–46.6)
HGB: 12.4 g/dL (ref 11.6–15.9)
LYMPH%: 29.8 % (ref 14.0–49.7)
MCH: 28.3 pg (ref 25.1–34.0)
MCHC: 32.8 g/dL (ref 31.5–36.0)
MCV: 86.3 fL (ref 79.5–101.0)
MONO#: 0.7 10*3/uL (ref 0.1–0.9)
MONO%: 13.6 % (ref 0.0–14.0)
NEUT%: 53.3 % (ref 38.4–76.8)
NEUTROS ABS: 2.6 10*3/uL (ref 1.5–6.5)
PLATELETS: 248 10*3/uL (ref 145–400)
RBC: 4.38 10*6/uL (ref 3.70–5.45)
RDW: 14.3 % (ref 11.2–14.5)
WBC: 4.8 10*3/uL (ref 3.9–10.3)
lymph#: 1.4 10*3/uL (ref 0.9–3.3)

## 2017-01-14 LAB — COMPREHENSIVE METABOLIC PANEL
ALT: 23 U/L (ref 0–55)
ANION GAP: 11 meq/L (ref 3–11)
AST: 21 U/L (ref 5–34)
Albumin: 3.7 g/dL (ref 3.5–5.0)
Alkaline Phosphatase: 78 U/L (ref 40–150)
BILIRUBIN TOTAL: 0.26 mg/dL (ref 0.20–1.20)
BUN: 23.4 mg/dL (ref 7.0–26.0)
CO2: 26 meq/L (ref 22–29)
Calcium: 10 mg/dL (ref 8.4–10.4)
Chloride: 103 mEq/L (ref 98–109)
Creatinine: 1.1 mg/dL (ref 0.6–1.1)
EGFR: 50 mL/min/{1.73_m2} — ABNORMAL LOW (ref >90)
Glucose: 116 mg/dl (ref 70–140)
Potassium: 4.2 mEq/L (ref 3.5–5.1)
Sodium: 141 mEq/L (ref 136–145)
Total Protein: 6.9 g/dL (ref 6.4–8.3)

## 2017-01-14 NOTE — Progress Notes (Signed)
[facility]  [phone] [phone]) [phone]     ID: Debra Griffith DOB: 1941/08/04  MR#: [id number]  ZHY#:[id number]  Patient Care Team: Josetta Huddle, MD as PCP - General (Internal Medicine) Chauncey Cruel, MD as Consulting Physician (Oncology) Rolm Bookbinder, MD as Consulting Physician (General Surgery) Irene Limbo, MD as Consulting Physician (Plastic Surgery) Gaynelle Arabian, MD as Consulting Physician (Orthopedic Surgery) Bo Merino, MD as Consulting Physician (Rheumatology)  OTHER MD:  CHIEF COMPLAINT: Estrogen receptor positive breast cancer  CURRENT TREATMENT: Anastrozole   BREAST CANCER HISTORY: From the original intake note:  Debra Griffith had minor trauma to the left breast but did not think much about it until while swimming on the medication she felt a hard mass in her left breast. She brought it to medical attention and on 05/04/2016 she underwent left diagnostic mammography with tomography at the breast Center. This showed the breast density to be category B. In the left breast upper inner quadrant there was a persistent area of asymmetry measuring 4.9 cm. On exam this was a firm but poorly defined palpable mass. Biopsy of this mass 05/04/2016 showed ([id number]) an invasive ductal carcinoma, grade 1, estrogen receptor 90% positive with moderate staining intensity, progesterone receptor 90% positive with strong staining intensity, with an MIB-1 of 10%, and no HER-2 amplification, the signals ratio being 1.19 and the number per cell 1.90.  On 05/09/2016 the patient had ultrasonography of the left axilla which was benign. On the same day she had a second biopsy of what is either a second mass or a distant area of the same mass (in the same quadrant) and this showed ([id number]) invasive ductal carcinoma, grade 2, estrogen receptor 100% positive, progesterone receptor 90% positive, both with strong staining intensity, with an MIB-1 of 15%,  and no HER-2 amplification, the signals ratio being 1.50 and the number per cell 3.15.  The patient's subsequent history is as detailed below.  INTERVAL HISTORY: Debra Griffith returns today for follow-up of her estrogen receptor positive breast cancer. She continues on anastrozole, with excellent tolerance.  Hot flashes and vaginal dryness are not a major issue. She never developed the arthralgias or myalgias that many patients can experience on this medication. She obtains it at a good price.  REVIEW OF SYSTEMS: Her her chest wall wounds have finally closed. She is pleased with that, but very dissatisfied with the cosmetic result. She says she was very angry about this and requested the notes from the surgeons but after reading them she realized "they say with my life". She is no longer angry but she still dissatisfied. She is not interested in reconstruction however. Aside from that she has some induration in the superior right areolar area which she wanted me to look at. She denies problems with pain, fever, or bleeding. A detailed review of systems today was otherwise stable   PAST MEDICAL HISTORY: Past Medical History:  Diagnosis Date  . Anemia    hx of   . Anxiety   . Breast cancer in female [facility]) 03/2016   left  . Chronic bronchitis ([place])    FLARE UPS USUALLY ONCE A YEAR  . Chronic lower back pain   . Complication of anesthesia    trouble waking up   . CRI (chronic renal insufficiency) 07/27/2016  . DDD (degenerative disc disease), lumbar   . Depression   . GERD (gastroesophageal reflux disease)   . History of recent fall    "twice on Sunday; once yesterday" (  08/29/2016)  . Hypertension   . Inflammatory arthritis 07/27/2016   Sero Negative, Positive Synovitis hands, [place]  . Insomnia   . Irregular heart beats   . Macular degeneration of right eye   . Obesity   . Osteoarthritis of both feet 07/27/2016  . Osteoarthritis of both hands 07/27/2016  . PONV (postoperative nausea and  vomiting)    SEVERE N&V AND DIARRHEA AFTER HYSTERECTOMY AND AFTER WISDOM TEETH EXTRACTIONS - NO PROBLEMS WITH LAST 2 SURGERIES - THE HIP AND LEFT KNEE  . RBBB (right bundle branch block) 07/27/2016  . Rheumatoid arthritis [facility])    "doctor recently took me off all RX for this" (08/02/2016)  . Right bundle branch block   . Right bundle branch block   . S/p dental crown    dental crowns on every tooth  . Vitamin D deficiency     PAST SURGICAL HISTORY: Past Surgical History:  Procedure Laterality Date  . ABDOMINAL HYSTERECTOMY    . AXILLARY SURGERY Left 06/25/2016   Aspiration of left axillary seroma   . BREAST BIOPSY Left 03/2016  . BREAST LUMPECTOMY WITH RADIOACTIVE SEED AND SENTINEL LYMPH NODE BIOPSY Left 06/13/2016   Procedure: LEFT BREAST LUMPECTOMY WITH BRACKETED  RADIOACTIVE SEED AND SENTINEL LYMPH NODE BIOPSY;  Surgeon: Rolm Bookbinder, MD;  Location: [place];  Service: General;  Laterality: Left;  . BREAST RECONSTRUCTION Bilateral 06/25/2016   BILATERAL ONCOPLASTIC BREAST RECONSTRUCTION WITH BREAST REDUCTION  . BREAST RECONSTRUCTION WITH PLACEMENT OF TISSUE EXPANDER AND FLEX HD (ACELLULAR HYDRATED DERMIS) Bilateral 06/25/2016   Procedure: BILATERAL ONCOPLASTIC BREAST RECONSTRUCTION WITH BREAST REDUCTION, Aspiration of left axillary seroma;  Surgeon: Irene Limbo, MD;  Location: [place];  Service: Plastics;  Laterality: Bilateral;  . BREAST REDUCTION SURGERY Bilateral 06/25/2016   Procedure: MAMMARY REDUCTION  (BREAST) BILATERAL;  Surgeon: Irene Limbo, MD;  Location: [place];  Service: Plastics;  Laterality: Bilateral;  . CESAREAN SECTION  1966; 1971; 1973  . COLONOSCOPY    . DEBRIDEMENT AND CLOSURE WOUND Right 07/11/2016   Procedure: DEBRIDEMENT LEFT BREAST;  Surgeon: Irene Limbo, MD;  Location: [place];  Service: Plastics;  Laterality: Right;  . JOINT REPLACEMENT    . KNEE ARTHROSCOPY Right    "before replacement"  . MASTECTOMY COMPLETE / SIMPLE Left 08/02/2016   total    . REDUCTION MAMMAPLASTY Bilateral 06/25/2016  . TOTAL HIP ARTHROPLASTY  06/24/2012   Procedure: TOTAL HIP ARTHROPLASTY;  Surgeon: [name], MD;  Location: [facility];  Service: Orthopedics;  Laterality: Left;  . TOTAL KNEE ARTHROPLASTY N/A 11/02/2013   Procedure: LEFT TOTAL KNEE ARTHROPLASTY WITH RIGHT KNEE CORTISONE INJECTION;  Surgeon: [name], MD;  Location: [facility];  Service: Orthopedics;  Laterality: N/A;  . TOTAL KNEE ARTHROPLASTY Right 05/31/2014   Procedure: RIGHT TOTAL KNEE ARTHROPLASTY;  Surgeon: [name], MD;  Location: [facility];  Service: Orthopedics;  Laterality: Right;  . TOTAL MASTECTOMY Left 08/02/2016   Procedure: LEFT TOTAL MASTECTOMY;  Surgeon: Rolm Bookbinder, MD;  Location: [place];  Service: General;  Laterality: Left;  . TUBAL LIGATION  1973  . WISDOM TOOTH EXTRACTION  1970's   admitted to [facility] for surgery    FAMILY HISTORY Family History  Problem Relation Age of Onset  . Diabetes Mother     The patient's mother died at age 21. The patient's father died at age 70 following a stroke. The patient had no brothers, 1 sister. There is no history of breast or ovarian cancer in the family.  GYNECOLOGIC HISTORY:  No LMP recorded. Patient has had a hysterectomy. Menarche age 64, first live birth age 25. The patient is GX P3. She underwent total abdominal hysterectomy, with bilateral salpingo-oophorectomy, in 1993. She used estrogen replacement approximately 9 months. She never used oral contraceptives.  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 Her husband [name] used to run for season small and similar businesses but he also is retired. Son Aaron Edelman is a Programme researcher, broadcasting/film/video in [place]. Son Legrand Como is a Engineer, maintenance (IT) in [facility]. Daughter Taurus Willis lives in [place]. 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 The patient has 5 grandchildren. 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    ADVANCED DIRECTIVES: In place   HEALTH MAINTENANCE: Social History  Substance Use Topics  . Smoking status: Never Smoker  . Smokeless tobacco: Never Used  . Alcohol use No     Colonoscopy:2015/[facility]  PAP:  Bone density: Remote   Allergies  Allergen Reactions  . Morphine And Related Other (See Comments)    hallucinations    Current Outpatient Prescriptions  Medication Sig Dispense Refill  . anastrozole (ARIMIDEX) 1 MG tablet TAKE ONE TABLET BY MOUTH ONCE DAILY 90 tablet 2  . apixaban (ELIQUIS) 5 MG TABS tablet Take 1 tablet (5 mg total) by mouth 2 (two) times daily. 60 tablet 2  . buPROPion (WELLBUTRIN SR) 150 MG 12 hr tablet Take 150 mg by mouth every morning.    . cholecalciferol (VITAMIN D) 1000 units tablet Take 1,000 Units by mouth daily.    . clonazePAM (KLONOPIN) 1 MG tablet Take 2 mg by mouth 2 (two) times daily.     . Cyanocobalamin (B-12) 500 MCG TABS Take 500 mcg by mouth daily.    . hydroxychloroquine (PLAQUENIL) 200 MG tablet Take 1 tablet (200 mg total) by mouth 2 (two) times daily. 135 tablet 1  . MAGNESIUM PO Take 2 tablets by mouth daily.    . metoprolol tartrate (LOPRESSOR) 25 MG tablet Take 25 mg by mouth 2 (two) times daily.     . Misc Natural Products (TART CHERRY ADVANCED PO) Take 1 capsule by mouth daily.    . Omega-3 Fatty Acids (FISH OIL) 1000 MG CAPS Take 1,000 mg by mouth daily.    . Turmeric 500 MG CAPS Take 500 mg by mouth daily.     . vitamin C (ASCORBIC ACID) 500 MG tablet Take 1,000 mg by mouth daily.     No current facility-administered medications for this visit.    Facility-Administered Medications Ordered in Other Visits  Medication Dose Route Frequency Provider Last Rate Last Dose  . 0.9 %  sodium chloride infusion   Intravenous Continuous Rolm Bookbinder, MD      . acetaminophen (TYLENOL) tablet 650 mg  650 mg Oral Q6H PRN Rolm Bookbinder, MD       Or  . acetaminophen (TYLENOL) suppository 650 mg  650 mg Rectal Q6H  PRN Rolm Bookbinder, MD      . ceFAZolin (ANCEF) 2 g in dextrose 5 % 100 mL IVPB  2 g Intravenous Q8H Rolm Bookbinder, MD      . enoxaparin (LOVENOX) injection 40 mg  40 mg Subcutaneous Q24H Rolm Bookbinder, MD      . methocarbamol (ROBAXIN) tablet 500 mg  500 mg Oral Q6H PRN Rolm Bookbinder, MD      . metroNIDAZOLE (FLAGYL) tablet 500 mg  500 mg Oral Q8H Rolm Bookbinder, MD      .  ondansetron (ZOFRAN-ODT) disintegrating tablet 4 mg  4 mg Oral Q6H PRN Rolm Bookbinder, MD       Or  . ondansetron [facility]) 4 mg in sodium chloride 0.9 % 50 mL IVPB  4 mg Intravenous Q6H PRN Rolm Bookbinder, MD      . oxyCODONE (Oxy IR/ROXICODONE) immediate release tablet 5-10 mg  5-10 mg Oral Q4H PRN Rolm Bookbinder, MD      . simethicone [facility]) chewable tablet 40 mg  40 mg Oral Q6H PRN Rolm Bookbinder, MD        OBJECTIVE:Morbidly obese white woman who appears stated age Vitals:   01/14/17 1147  BP: (!) 159/73  Pulse: 79  Resp: 18  Temp: 97.8 F (36.6 C)     Body mass index is 50.64 kg/m.    ECOG FS:1 - Symptomatic but completely ambulatory  Sclerae unicteric, EOMs intact Oropharynx clear and moist No cervical or supraclavicular adenopathy Lungs no rales or rhonchi Heart regular rate and rhythm Abd soft, nontender, positive bowel sounds MSK no focal spinal tenderness, no upper extremity lymphedema Neuro: nonfocal, well oriented, appropriate affect Breasts: The right breast has undergone reduction mammoplasty. Just above the areola, at about the 12:00 position, there is an area measuring vertically about a 2 cm and about 1 cm across, which is indurated, nontender, and not erythematous. This feels very much like scar tissue. On the left side the patient is status post mastectomy. The incision is very irregular, but it has all completely healed, with the previously dehisced areas all closed. There is a very large area of loose fat in the axilla which is uncomfortable to her. Both axillae  however are otherwise benign  Breasts: The right breast is status post reduction mammoplasty. It is otherwise unremarkable. Left breast is status post mastectomy and is imaged below. There is no evidence of chest wall recurrence. The left axilla is benign.  Photo of left chest wall 10/17/2016    LAB RESULTS:  CMP     Component Value Date/Time   NA 141 01/14/2017 1126   K 4.2 01/14/2017 1126   CL 107 09/02/2016 0351   CO2 26 01/14/2017 1126   GLUCOSE 116 01/14/2017 1126   BUN 23.4 01/14/2017 1126   CREATININE 1.1 01/14/2017 1126   CALCIUM 10.0 01/14/2017 1126   PROT 6.9 01/14/2017 1126   ALBUMIN 3.7 01/14/2017 1126   AST 21 01/14/2017 1126   ALT 23 01/14/2017 1126   ALKPHOS 78 01/14/2017 1126   BILITOT 0.26 01/14/2017 1126   GFRNONAA >60 09/02/2016 0351   GFRAA >60 09/02/2016 0351    INo results found for: SPEP, UPEP  Lab Results  Component Value Date   WBC 4.8 01/14/2017   NEUTROABS 2.6 01/14/2017   HGB 12.4 01/14/2017   HCT 37.8 01/14/2017   MCV 86.3 01/14/2017   PLT 248 01/14/2017      Chemistry      Component Value Date/Time   NA 141 01/14/2017 1126   K 4.2 01/14/2017 1126   CL 107 09/02/2016 0351   CO2 26 01/14/2017 1126   BUN 23.4 01/14/2017 1126   CREATININE 1.1 01/14/2017 1126   GLU 95 01/20/2016      Component Value Date/Time   CALCIUM 10.0 01/14/2017 1126   ALKPHOS 78 01/14/2017 1126   AST 21 01/14/2017 1126   ALT 23 01/14/2017 1126   BILITOT 0.26 01/14/2017 1126       No results found for: LABCA2  No components found for: OFHQR975  No  results for input(s): INR in the last 168 hours.  Urinalysis    Component Value Date/Time   COLORURINE YELLOW 08/29/2016 2120   APPEARANCEUR HAZY (A) 08/29/2016 2120   LABSPEC 1.014 08/29/2016 2120   PHURINE 5.0 08/29/2016 2120   GLUCOSEU NEGATIVE 08/29/2016 2120   HGBUR NEGATIVE 08/29/2016 2120   BILIRUBINUR NEGATIVE 08/29/2016 2120   KETONESUR NEGATIVE 08/29/2016 2120   PROTEINUR NEGATIVE  08/29/2016 2120   UROBILINOGEN 0.2 05/26/2014 1054   NITRITE NEGATIVE 08/29/2016 2120   LEUKOCYTESUR LARGE (A) 08/29/2016 2120     STUDIES: No results found.  ELIGIBLE FOR AVAILABLE RESEARCH PROTOCOL: no  ASSESSMENT: 76 y.o. [place] woman status post left breast upper inner quadrant biopsy 05/04/2016 for a clinical T2 N0 invasive ductal carcinoma, grade 1 estrogen and progesterone receptor positive, HER-2 nonamplified, with an MIB-1 of 10%.  (1) biopsy of a second area 05/09/2016 also in the upper inner quadrant of the left breast showed invasive ductal carcinoma, grade 2, estrogen and progesterone receptor positive, HER-2 nonamplified, with an MIB-1 of 15%.  (2) status post left lumpectomy and sentinel lymph node sampling 06/13/2016 for an mpT1c pN0(i+), stage IA invasive ductal carcinoma, grade 1, with close but negative margins  (a) status post bilateral reduction mammoplasty with left oncoplastic surgery 06/25/2016  (b) status post debridement of left nipple/areolar necrosis 07/11/2016  (c) status post left simple mastectomy 08/02/2016 showing inflammation and abscess but no evidence of malignancy  (3) Oncotype score of 18 predicts a 10 year risk of recurrence outside the breast of 11% if the patient's only systemic therapy is tamoxifen for 5 years. It also predicts no significant benefit from chemotherapy  (4) postmastectomy radiation not indicated  (5) started anastrozole September 2017  PLAN: [place] as left chest wall has finally healed. She is very dissatisfied with the appearance and is also uncomfortable with some of the "flapping areas"--she is interested in a second plastic opinion and specifically requests Dr. Harlow Mares. I will be glad to facilitate that.  From the point of view of systemic therapy she is tolerating anastrozole with no side effects and the plan is to continue that for a total of 5 years.  We should document a bone density some time this year to serve as  baseline.  I have set her up for mammography in August. She will return to see me in October.  She knows to call for any other problems that may develop before the next visit.   Chauncey Cruel, MD   01/14/2017 2:09 PM Medical Oncology and Hematology [facility] [address] [place], [place] [address] Tel. [phone]    Fax. [phone]

## 2017-01-24 DIAGNOSIS — H35723 Serous detachment of retinal pigment epithelium, bilateral: Secondary | ICD-10-CM | POA: Diagnosis not present

## 2017-01-24 DIAGNOSIS — H353133 Nonexudative age-related macular degeneration, bilateral, advanced atrophic without subfoveal involvement: Secondary | ICD-10-CM | POA: Diagnosis not present

## 2017-01-24 DIAGNOSIS — Z79899 Other long term (current) drug therapy: Secondary | ICD-10-CM | POA: Diagnosis not present

## 2017-01-24 DIAGNOSIS — H353211 Exudative age-related macular degeneration, right eye, with active choroidal neovascularization: Secondary | ICD-10-CM | POA: Diagnosis not present

## 2017-01-30 ENCOUNTER — Telehealth (INDEPENDENT_AMBULATORY_CARE_PROVIDER_SITE_OTHER): Payer: Self-pay | Admitting: Rheumatology

## 2017-01-30 NOTE — Telephone Encounter (Signed)
I received request for records from [facility] 10-30-2016, I faxed back to them advising we have no records within requested dates.

## 2017-02-05 ENCOUNTER — Telehealth: Payer: Self-pay | Admitting: Oncology

## 2017-02-05 NOTE — Telephone Encounter (Signed)
Faxed office note to [facility]  [phone]

## 2017-02-15 ENCOUNTER — Encounter: Payer: Self-pay | Admitting: Adult Health

## 2017-02-15 ENCOUNTER — Other Ambulatory Visit: Payer: Self-pay | Admitting: Oncology

## 2017-02-15 ENCOUNTER — Ambulatory Visit (HOSPITAL_BASED_OUTPATIENT_CLINIC_OR_DEPARTMENT_OTHER): Payer: Medicare Other | Admitting: Adult Health

## 2017-02-15 ENCOUNTER — Telehealth: Payer: Self-pay | Admitting: *Deleted

## 2017-02-15 VITALS — BP 126/65 | HR 91 | Temp 98.1°F | Resp 18 | Ht 60.0 in

## 2017-02-15 DIAGNOSIS — Z79899 Other long term (current) drug therapy: Secondary | ICD-10-CM | POA: Diagnosis not present

## 2017-02-15 DIAGNOSIS — E2839 Other primary ovarian failure: Secondary | ICD-10-CM

## 2017-02-15 DIAGNOSIS — C50212 Malignant neoplasm of upper-inner quadrant of left female breast: Secondary | ICD-10-CM | POA: Diagnosis not present

## 2017-02-15 DIAGNOSIS — Z170 Estrogen receptor positive status [ER+]: Secondary | ICD-10-CM | POA: Diagnosis not present

## 2017-02-15 NOTE — Progress Notes (Signed)
CLINIC:  Survivorship   REASON FOR VISIT:  Routine follow-up post-treatment for a recent history of breast cancer.  BRIEF ONCOLOGIC HISTORY:    Malignant neoplasm of upper-inner quadrant of left breast in female, estrogen receptor positive ([place])   05/04/2016 Initial Biopsy    Left breast biopsy: IDC, grade 1, ER+(90%), PR+(90%), Ki-67 10%, HER-2 negative (ratio 1.19).      05/09/2016 Initial Biopsy    Left breast second area of concern: ILC, grade 2, ER+(100%), PR+(90%), Ki-67 15%, HER-2 negative (ratio 1.50).      05/31/2016 Initial Diagnosis    Malignant neoplasm of upper-inner quadrant of left breast in female, estrogen receptor positive ([place])      06/13/2016 Surgery    Left breast lumpectomy: IDC grade 1, two foci, 1.7cm and 1.5 cm, margins negative, lobular neoplasia, 1 SLN with isolated tumor cells, 1 SLN negative.   T1c, N0 (i+)      06/13/2016 Oncotype testing    18/11%, intermediate risk      05/2016 -  Anti-estrogen oral therapy    Anastrozole daily      06/25/2016 Surgery    Bilateral reduction  mammoplasty      07/11/2016 Surgery    Debridement of left nipple necrosis      08/02/2016 Surgery    Left simple mastectomy showing inflammation and abscess but no malignancy       INTERVAL HISTORY:  Debra Griffith presents to the [facility] today for our initial meeting to review her survivorship care plan detailing her treatment course for breast cancer, as well as monitoring long-term side effects of that treatment, education regarding health maintenance, screening, and overall wellness and health promotion.     Overall, Debra Griffith reports feeling quite well.  She does have some irritation at her left mastectomy site sometimes.  She is taking Anastrozole daily and is tolerating it well.     REVIEW OF SYSTEMS:  Review of Systems  Constitutional: Negative for appetite change, chills, diaphoresis, fatigue, fever and unexpected weight change.  HENT:    Negative for hearing loss and lump/mass.   Eyes: Negative for eye problems and icterus.  Respiratory: Negative for chest tightness, cough and shortness of breath.   Cardiovascular: Negative for chest pain, leg swelling and palpitations.  Gastrointestinal: Negative for abdominal distention and abdominal pain.  Endocrine: Positive for hot flashes.  Musculoskeletal: Positive for arthralgias (chronic knee issues, and rheumatoid arthritis).  Skin: Negative for itching and rash.  Neurological: Negative for dizziness, extremity weakness, numbness and seizures.  Hematological: Negative for adenopathy.  Psychiatric/Behavioral: The patient is nervous/anxious.   Breast: Denies any new nodularity, masses, tenderness, nipple changes, or nipple discharge.      ONCOLOGY TREATMENT TEAM:  1. Surgeon:  Dr. Donne Hazel at [facility] 2. Medical Oncologist: Dr. Jana Hakim    PAST MEDICAL/SURGICAL HISTORY:  Past Medical History:  Diagnosis Date  . Anemia    hx of   . Anxiety   . Breast cancer in female [facility]) 03/2016   left  . Chronic bronchitis ([place])    FLARE UPS USUALLY ONCE A YEAR  . Chronic lower back pain   . Complication of anesthesia    trouble waking up   . CRI (chronic renal insufficiency) 07/27/2016  . DDD (degenerative disc disease), lumbar   . Depression   . GERD (gastroesophageal reflux disease)   . History of recent fall    "twice on Sunday; once yesterday" (08/29/2016)  . Hypertension   . Inflammatory  arthritis 07/27/2016   Sero Negative, Positive Synovitis hands, [place]  . Insomnia   . Irregular heart beats   . Macular degeneration of right eye   . Obesity   . Osteoarthritis of both feet 07/27/2016  . Osteoarthritis of both hands 07/27/2016  . PONV (postoperative nausea and vomiting)    SEVERE N&V AND DIARRHEA AFTER HYSTERECTOMY AND AFTER WISDOM TEETH EXTRACTIONS - NO PROBLEMS WITH LAST 2 SURGERIES - THE HIP AND LEFT KNEE  . RBBB (right bundle branch block)  07/27/2016  . Rheumatoid arthritis [facility])    "doctor recently took me off all RX for this" (08/02/2016)  . Right bundle branch block   . Right bundle branch block   . S/p dental crown    dental crowns on every tooth  . Vitamin D deficiency    Past Surgical History:  Procedure Laterality Date  . ABDOMINAL HYSTERECTOMY    . AXILLARY SURGERY Left 06/25/2016   Aspiration of left axillary seroma   . BREAST BIOPSY Left 03/2016  . BREAST LUMPECTOMY WITH RADIOACTIVE SEED AND SENTINEL LYMPH NODE BIOPSY Left 06/13/2016   Procedure: LEFT BREAST LUMPECTOMY WITH BRACKETED  RADIOACTIVE SEED AND SENTINEL LYMPH NODE BIOPSY;  Surgeon: Rolm Bookbinder, MD;  Location: [place];  Service: General;  Laterality: Left;  . BREAST RECONSTRUCTION Bilateral 06/25/2016   BILATERAL ONCOPLASTIC BREAST RECONSTRUCTION WITH BREAST REDUCTION  . BREAST RECONSTRUCTION WITH PLACEMENT OF TISSUE EXPANDER AND FLEX HD (ACELLULAR HYDRATED DERMIS) Bilateral 06/25/2016   Procedure: BILATERAL ONCOPLASTIC BREAST RECONSTRUCTION WITH BREAST REDUCTION, Aspiration of left axillary seroma;  Surgeon: Irene Limbo, MD;  Location: [place];  Service: Plastics;  Laterality: Bilateral;  . BREAST REDUCTION SURGERY Bilateral 06/25/2016   Procedure: MAMMARY REDUCTION  (BREAST) BILATERAL;  Surgeon: Irene Limbo, MD;  Location: [place];  Service: Plastics;  Laterality: Bilateral;  . CESAREAN SECTION  1966; 1971; 1973  . COLONOSCOPY    . DEBRIDEMENT AND CLOSURE WOUND Right 07/11/2016   Procedure: DEBRIDEMENT LEFT BREAST;  Surgeon: Irene Limbo, MD;  Location: [place];  Service: Plastics;  Laterality: Right;  . JOINT REPLACEMENT    . KNEE ARTHROSCOPY Right    "before replacement"  . MASTECTOMY COMPLETE / SIMPLE Left 08/02/2016   total  . REDUCTION MAMMAPLASTY Bilateral 06/25/2016  . TOTAL HIP ARTHROPLASTY  06/24/2012   Procedure: TOTAL HIP ARTHROPLASTY;  Surgeon: [name], MD;  Location: [facility];  Service: Orthopedics;  Laterality: Left;    . TOTAL KNEE ARTHROPLASTY N/A 11/02/2013   Procedure: LEFT TOTAL KNEE ARTHROPLASTY WITH RIGHT KNEE CORTISONE INJECTION;  Surgeon: [name], MD;  Location: [facility];  Service: Orthopedics;  Laterality: N/A;  . TOTAL KNEE ARTHROPLASTY Right 05/31/2014   Procedure: RIGHT TOTAL KNEE ARTHROPLASTY;  Surgeon: [name], MD;  Location: [facility];  Service: Orthopedics;  Laterality: Right;  . TOTAL MASTECTOMY Left 08/02/2016   Procedure: LEFT TOTAL MASTECTOMY;  Surgeon: Rolm Bookbinder, MD;  Location: [place];  Service: General;  Laterality: Left;  . TUBAL LIGATION  1973  . WISDOM TOOTH EXTRACTION  1970's   admitted to hospital for surgery     ALLERGIES:  Allergies  Allergen Reactions  . Morphine And Related Other (See Comments)    hallucinations     CURRENT MEDICATIONS:  Outpatient Encounter Prescriptions as of 02/15/2017  Medication Sig  . anastrozole (ARIMIDEX) 1 MG tablet TAKE ONE TABLET BY MOUTH ONCE DAILY  . apixaban (ELIQUIS) 5 MG TABS tablet Take 1 tablet (5 mg total) by mouth 2 (two)  times daily.  Marland Kitchen buPROPion (WELLBUTRIN SR) 150 MG 12 hr tablet Take 150 mg by mouth every morning.  . cholecalciferol (VITAMIN D) 1000 units tablet Take 1,000 Units by mouth daily.  . clonazePAM (KLONOPIN) 1 MG tablet Take 2 mg by mouth 2 (two) times daily.   . Cyanocobalamin (B-12) 500 MCG TABS Take 500 mcg by mouth daily.  . hydroxychloroquine (PLAQUENIL) 200 MG tablet Take 1 tablet (200 mg total) by mouth 2 (two) times daily.  Marland Kitchen MAGNESIUM PO Take 2 tablets by mouth daily.  . metoprolol tartrate (LOPRESSOR) 25 MG tablet Take 25 mg by mouth 2 (two) times daily.   . Misc Natural Products (TART CHERRY ADVANCED PO) Take 1 capsule by mouth daily.  . Omega-3 Fatty Acids (FISH OIL) 1000 MG CAPS Take 1,000 mg by mouth daily.  . Turmeric 500 MG CAPS Take 500 mg by mouth daily.   . vitamin C (ASCORBIC ACID) 500 MG tablet Take 1,000 mg by mouth daily.   Facility-Administered Encounter Medications as of  02/15/2017  Medication  . 0.9 %  sodium chloride infusion  . acetaminophen (TYLENOL) tablet 650 mg   Or  . acetaminophen (TYLENOL) suppository 650 mg  . ceFAZolin (ANCEF) 2 g in dextrose 5 % 100 mL IVPB  . enoxaparin (LOVENOX) injection 40 mg  . methocarbamol (ROBAXIN) tablet 500 mg  . metroNIDAZOLE (FLAGYL) tablet 500 mg  . ondansetron (ZOFRAN-ODT) disintegrating tablet 4 mg   Or  . ondansetron (ZOFRAN) 4 mg in sodium chloride 0.9 % 50 mL IVPB  . oxyCODONE (Oxy IR/ROXICODONE) immediate release tablet 5-10 mg  . simethicone (MYLICON) chewable tablet 40 mg     ONCOLOGIC FAMILY HISTORY:  Family History  Problem Relation Age of Onset  . Diabetes Mother      GENETIC COUNSELING/TESTING: Not indicated at this time  SOCIAL HISTORY:  COLANDRA OHANIAN is married and lives with her husband in [place], [place].  She has 3 children and they live in [place], [place], and [place].  Ms. Montijo is currently retired.  She denies any current or history of tobacco, alcohol, or illicit drug use.     PHYSICAL EXAMINATION:  Vital Signs:   Vitals:   02/15/17 1115  BP: 126/65  Pulse: 91  Resp: 18  Temp: 98.1 F (36.7 C)   Filed Weights   General: Well-nourished, well-appearing female in no acute distress.  She is unaccompanied today.   HEENT: Head is normocephalic.  Pupils equal and reactive to light. Conjunctivae clear without exudate.  Sclerae anicteric. Oral mucosa is pink, moist.  Oropharynx is pink without lesions or erythema.  Lymph: No cervical, supraclavicular, or infraclavicular lymphadenopathy noted on palpation.  Cardiovascular: Regular rate and rhythm.Marland Kitchen Respiratory: Clear to auscultation bilaterally. Chest expansion symmetric; breathing non-labored.  GI: Abdomen soft and round; non-tender, non-distended. Bowel sounds normoactive.  GU: Deferred.  Neuro: No focal deficits. Steady gait.  Psych: Mood and affect normal and appropriate for situation.  Extremities: No  edema. Skin: Warm and dry.  LABORATORY DATA:  None for this visit.  DIAGNOSTIC IMAGING:  None for this visit.      ASSESSMENT AND PLAN:  Ms.. Griffith is a pleasant 76 y.o. female with Stage IA left breast invasive ductal carcinoma, ER+/PR+/HER2-, diagnosed in 04/2016, treated with lumpectomy followed by mastectomy, and anti-estrogen therapy with Anastrozole daily beginning in September, 2017.  She presents to the Survivorship Clinic for our initial meeting and routine follow-up post-completion of treatment for breast cancer.    1.  Stage IA left breast cancer:  Debra Griffith is continuing to recover from definitive treatment for breast cancer. She will follow-up with her medical oncologist, Dr. Jana Hakim in October, 2018 with history and physical exam per surveillance protocol.  She will continue her anti-estrogen therapy with Anastrozole. Thus far, she is tolerating the Anastrozole well, with minimal side effects. She was instructed to make Dr. Jana Hakim or myself aware if she begins to experience any worsening side effects of the medication and I could see her back in clinic to help manage those side effects, as needed. Today, a comprehensive survivorship care plan and treatment summary was reviewed with the patient today detailing her breast cancer diagnosis, treatment course, potential late/long-term effects of treatment, appropriate follow-up care with recommendations for the future, and patient education resources.  A copy of this summary, along with a letter will be sent to the patient's primary care provider via mail/fax/In Basket message after today's visit.    2. Bone health:  Given Debra Griffith's age/history of breast cancer and her current treatment regimen including anti-estrogen therapy with Anastrozole, she is at risk for bone demineralization.  Her last DEXA scan was many years ago and she is due for one.  We will order to be scheduled at same time as her mammogram.  In the meantime, she was  encouraged to increase her consumption of foods rich in calcium, as well as increase her weight-bearing activities.  She was given education on specific activities to promote bone health.  3. Cancer screening:  Due to Debra Griffith's history and her age, she should receive screening for skin cancers, colon cancer, and gynecologic cancers.  The information and recommendations are listed on the patient's comprehensive care plan/treatment summary and were reviewed in detail with the patient.    4. Health maintenance and wellness promotion: Debra Griffith was encouraged to consume 5-7 servings of fruits and vegetables per day. We reviewed the "Nutrition Rainbow" handout, as well as the handout "Take Control of Your Health and Reduce Your Cancer Risk" from the [place].  She was also encouraged to engage in moderate to vigorous exercise for 30 minutes per day most days of the week. We discussed the [employer] fitness program, which is designed for cancer survivors to help them become more physically fit after cancer treatments.  She was instructed to limit her alcohol consumption and continue to abstain from tobacco use.     5. Support services/counseling: It is not uncommon for this period of the patient's cancer care trajectory to be one of many emotions and stressors.  We discussed an opportunity for her to participate in the next session of [facility] ("Finding Your New Normal") support group series designed for patients after they have completed treatment.   Debra Griffith was encouraged to take advantage of our many other support services programs, support groups, and/or counseling in coping with her new life as a cancer survivor after completing anti-cancer treatment.  She was offered support today through active listening and expressive supportive counseling.  She was given information regarding our available services and encouraged to contact me with any questions or for help enrolling in any of our  support group/programs.    Dispo:   -Return to cancer center in October, 2018  -She is welcome to return back to the Survivorship Clinic at any time; no additional follow-up needed at this time.  -Consider referral back to survivorship as a long-term survivor for continued surveillance  A total of (30) minutes  of face-to-face time was spent with this patient with greater than 50% of that time in counseling and care-coordination.   Gardenia Phlegm, NP Survivorship Program [place] [phone]   Note: PRIMARY CARE PROVIDER Josetta Huddle, [place] [phone]

## 2017-02-15 NOTE — Telephone Encounter (Signed)
Per Mendel Ryder, NP scheduled patient bone density on 04/17/17 at 1000 am, and her mammogram to follow. Notified patient of this, understanding verbalized. Patient expressed appreciation for the call.

## 2017-02-27 DIAGNOSIS — H353211 Exudative age-related macular degeneration, right eye, with active choroidal neovascularization: Secondary | ICD-10-CM | POA: Diagnosis not present

## 2017-02-27 DIAGNOSIS — H353113 Nonexudative age-related macular degeneration, right eye, advanced atrophic without subfoveal involvement: Secondary | ICD-10-CM | POA: Diagnosis not present

## 2017-02-27 DIAGNOSIS — H2511 Age-related nuclear cataract, right eye: Secondary | ICD-10-CM | POA: Diagnosis not present

## 2017-02-27 DIAGNOSIS — H35721 Serous detachment of retinal pigment epithelium, right eye: Secondary | ICD-10-CM | POA: Diagnosis not present

## 2017-02-27 DIAGNOSIS — H43821 Vitreomacular adhesion, right eye: Secondary | ICD-10-CM | POA: Diagnosis not present

## 2017-03-04 DIAGNOSIS — G894 Chronic pain syndrome: Secondary | ICD-10-CM | POA: Diagnosis not present

## 2017-03-26 ENCOUNTER — Ambulatory Visit (INDEPENDENT_AMBULATORY_CARE_PROVIDER_SITE_OTHER): Payer: Medicare Other | Admitting: Rheumatology

## 2017-03-26 ENCOUNTER — Encounter: Payer: Self-pay | Admitting: Rheumatology

## 2017-03-26 VITALS — BP 133/82 | HR 70 | Resp 14 | Ht 62.0 in | Wt 262.0 lb

## 2017-03-26 DIAGNOSIS — Z79899 Other long term (current) drug therapy: Secondary | ICD-10-CM

## 2017-03-26 DIAGNOSIS — M5136 Other intervertebral disc degeneration, lumbar region: Secondary | ICD-10-CM

## 2017-03-26 DIAGNOSIS — M19071 Primary osteoarthritis, right ankle and foot: Secondary | ICD-10-CM

## 2017-03-26 DIAGNOSIS — M170 Bilateral primary osteoarthritis of knee: Secondary | ICD-10-CM

## 2017-03-26 DIAGNOSIS — C50212 Malignant neoplasm of upper-inner quadrant of left female breast: Secondary | ICD-10-CM

## 2017-03-26 DIAGNOSIS — M1990 Unspecified osteoarthritis, unspecified site: Secondary | ICD-10-CM | POA: Diagnosis not present

## 2017-03-26 DIAGNOSIS — Z170 Estrogen receptor positive status [ER+]: Secondary | ICD-10-CM | POA: Diagnosis not present

## 2017-03-26 DIAGNOSIS — M19041 Primary osteoarthritis, right hand: Secondary | ICD-10-CM | POA: Diagnosis not present

## 2017-03-26 DIAGNOSIS — M19042 Primary osteoarthritis, left hand: Secondary | ICD-10-CM

## 2017-03-26 DIAGNOSIS — M19072 Primary osteoarthritis, left ankle and foot: Secondary | ICD-10-CM

## 2017-03-26 NOTE — Progress Notes (Signed)
Office Visit Note  Patient: Debra Griffith             Date of Birth: 1940/09/29           MRN: [id number]             PCP: Josetta Huddle, MD Referring: Josetta Huddle, MD Visit Date: 03/26/2017 Occupation: '@GUAROCC'$ @    Subjective:  No chief complaint on file.   History of Present Illness: Debra Griffith is a 76 y.o. female  Who was last seen in our office on 10/29/2016 for inflammatory arthritis and high-risk prescription. Her chief complaint today includes pain to her back and is being seen for medication management today.   Patient reports that she has a pinhole right retina that is being addressed by her ophthalmologist.  For left breast cancer and surgery, pt is on avastin injection.  Due to being on Avastin injection for breast cancer and breast surgery, patient started using Plaquenil at 200 mg once a day Monday through Friday and wanted to minimize the side effect of peripheral vision loss by taking as low low-dose as possible. In addition, patient felt that it wasn't giving her much relief as it was to begin with when she was on 200 mg twice a day in the past. Patient has been on this dose since about March 2018 of one pill a day every day of the week. Patient has not noticed any difference on this dose versus the other dose. Namely, she continues to have joint pain.  Patient had a Plaquenil eye exam through Dr. Dahlia Bailiff office about 3 weeks ago. She states that she was told at her eye exam for Plaquenil toxicity was negative. We do not have a record of that exam yet.  Due to patient's activity of daily living is compromised due to her health, she requires assistance from home health/nurse's aide. With the help of her nurse's aide, she is getting significant assistance which allows her more comfort and independence   Activities of Daily Living:  Patient reports morning stiffness for 15 minutes.   Patient Reports nocturnal pain.  Difficulty dressing/grooming:  Reports Difficulty climbing stairs: Reports Difficulty getting out of chair: Reports Difficulty using hands for taps, buttons, cutlery, and/or writing: Reports   Review of Systems  Constitutional: Negative for fatigue.  HENT: Negative for mouth sores and mouth dryness.   Eyes: Negative for dryness.  Respiratory: Negative for shortness of breath.   Gastrointestinal: Negative for constipation and diarrhea.  Musculoskeletal: Negative for myalgias and myalgias.  Skin: Negative for sensitivity to sunlight.  Psychiatric/Behavioral: Negative for decreased concentration and sleep disturbance.    PMFS History:  Patient Active Problem List   Diagnosis Date Noted  . Primary osteoarthritis of both knees 10/25/2016  . New onset atrial fibrillation ([place])   . Cellulitis 08/30/2016  . Cellulitis of left breast 08/29/2016  . Dehydration 08/29/2016  . Hypotension 08/29/2016  . Confusion 08/29/2016  . RBBB (right bundle branch block) 07/27/2016  . CRI (chronic renal insufficiency) 07/27/2016  . DDD (degenerative disc disease), lumbar 07/27/2016  . Primary osteoarthritis of both feet 07/27/2016  . Primary osteoarthritis of both hands 07/27/2016  . Inflammatory arthritis 07/27/2016  . Malignant neoplasm of upper-inner quadrant of left breast in female, estrogen receptor positive ([place]) 05/31/2016  . Depression 06/03/2014  . Constipation 11/19/2013  . Insomnia 11/19/2013  . Anxiety 11/19/2013  . Status post total bilateral knee replacement 11/13/2013  . Essential hypertension   . GERD (gastroesophageal reflux  disease)   . OA (osteoarthritis) of knee 11/02/2013  . Postop Sinus tachycardia 06/27/2012  . Postop Hyponatremia 06/25/2012  . Postop Acute blood loss anemia 06/25/2012  . OA (osteoarthritis) of hip 06/24/2012    Past Medical History:  Diagnosis Date  . Anemia    hx of   . Anxiety   . Breast cancer in female [facility]) 03/2016   left  . Chronic bronchitis ([place])    FLARE UPS USUALLY  ONCE A YEAR  . Chronic lower back pain   . Complication of anesthesia    trouble waking up   . CRI (chronic renal insufficiency) 07/27/2016  . DDD (degenerative disc disease), lumbar   . Depression   . GERD (gastroesophageal reflux disease)   . History of recent fall    "twice on Sunday; once yesterday" (08/29/2016)  . Hypertension   . Inflammatory arthritis 07/27/2016   Sero Negative, Positive Synovitis hands, [place]  . Insomnia   . Irregular heart beats   . Macular degeneration of right eye   . Obesity   . Osteoarthritis of both feet 07/27/2016  . Osteoarthritis of both hands 07/27/2016  . PONV (postoperative nausea and vomiting)    SEVERE N&V AND DIARRHEA AFTER HYSTERECTOMY AND AFTER WISDOM TEETH EXTRACTIONS - NO PROBLEMS WITH LAST 2 SURGERIES - THE HIP AND LEFT KNEE  . RBBB (right bundle branch block) 07/27/2016  . Rheumatoid arthritis [facility])    "doctor recently took me off all RX for this" (08/02/2016)  . Right bundle branch block   . Right bundle branch block   . S/p dental crown    dental crowns on every tooth  . Vitamin D deficiency     Family History  Problem Relation Age of Onset  . Diabetes Mother    Past Surgical History:  Procedure Laterality Date  . ABDOMINAL HYSTERECTOMY    . AXILLARY SURGERY Left 06/25/2016   Aspiration of left axillary seroma   . BREAST BIOPSY Left 03/2016  . BREAST LUMPECTOMY WITH RADIOACTIVE SEED AND SENTINEL LYMPH NODE BIOPSY Left 06/13/2016   Procedure: LEFT BREAST LUMPECTOMY WITH BRACKETED  RADIOACTIVE SEED AND SENTINEL LYMPH NODE BIOPSY;  Surgeon: Rolm Bookbinder, MD;  Location: [place];  Service: General;  Laterality: Left;  . BREAST RECONSTRUCTION Bilateral 06/25/2016   BILATERAL ONCOPLASTIC BREAST RECONSTRUCTION WITH BREAST REDUCTION  . BREAST RECONSTRUCTION WITH PLACEMENT OF TISSUE EXPANDER AND FLEX HD (ACELLULAR HYDRATED DERMIS) Bilateral 06/25/2016   Procedure: BILATERAL ONCOPLASTIC BREAST RECONSTRUCTION WITH BREAST REDUCTION,  Aspiration of left axillary seroma;  Surgeon: Irene Limbo, MD;  Location: [place];  Service: Plastics;  Laterality: Bilateral;  . BREAST REDUCTION SURGERY Bilateral 06/25/2016   Procedure: MAMMARY REDUCTION  (BREAST) BILATERAL;  Surgeon: Irene Limbo, MD;  Location: [place];  Service: Plastics;  Laterality: Bilateral;  . CESAREAN SECTION  1966; 1971; 1973  . COLONOSCOPY    . DEBRIDEMENT AND CLOSURE WOUND Right 07/11/2016   Procedure: DEBRIDEMENT LEFT BREAST;  Surgeon: Irene Limbo, MD;  Location: [place];  Service: Plastics;  Laterality: Right;  . JOINT REPLACEMENT    . KNEE ARTHROSCOPY Right    "before replacement"  . MASTECTOMY COMPLETE / SIMPLE Left 08/02/2016   total  . REDUCTION MAMMAPLASTY Bilateral 06/25/2016  . TOTAL HIP ARTHROPLASTY  06/24/2012   Procedure: TOTAL HIP ARTHROPLASTY;  Surgeon: [name], MD;  Location: [facility];  Service: Orthopedics;  Laterality: Left;  . TOTAL KNEE ARTHROPLASTY N/A 11/02/2013   Procedure: LEFT TOTAL KNEE ARTHROPLASTY WITH RIGHT KNEE CORTISONE  INJECTION;  Surgeon: [name], MD;  Location: [facility];  Service: Orthopedics;  Laterality: N/A;  . TOTAL KNEE ARTHROPLASTY Right 05/31/2014   Procedure: RIGHT TOTAL KNEE ARTHROPLASTY;  Surgeon: [name], MD;  Location: [facility];  Service: Orthopedics;  Laterality: Right;  . TOTAL MASTECTOMY Left 08/02/2016   Procedure: LEFT TOTAL MASTECTOMY;  Surgeon: Rolm Bookbinder, MD;  Location: [place];  Service: General;  Laterality: Left;  . TUBAL LIGATION  1973  . WISDOM TOOTH EXTRACTION  1970's   admitted to hospital for surgery   Social History   Social History Narrative  . No narrative on file     Objective: Vital Signs: BP 133/82   Pulse 70   Resp 14   Ht '5\' 2"'$  (1.575 m)   Wt 262 lb (118.8 kg)   BMI 47.92 kg/m    Physical Exam  Constitutional: She is oriented to person, place, and time. She appears well-developed and well-nourished.  HENT:  Head: Normocephalic and atraumatic.   Eyes: EOM are normal. Pupils are equal, round, and reactive to light.  Cardiovascular: Normal rate, regular rhythm and normal heart sounds.  Exam reveals no gallop and no friction rub.   No murmur heard. Pulmonary/Chest: Effort normal and breath sounds normal. She has no wheezes. She has no rales.  Abdominal: Soft. Bowel sounds are normal. She exhibits no distension. There is no tenderness. There is no guarding. No hernia.  Musculoskeletal: Normal range of motion. She exhibits no edema, tenderness or deformity.  Lymphadenopathy:    She has no cervical adenopathy.  Neurological: She is alert and oriented to person, place, and time. Coordination normal.  Skin: Skin is warm and dry. Capillary refill takes less than 2 seconds. No rash noted.  Psychiatric: She has a normal mood and affect. Her behavior is normal.  Nursing note and vitals reviewed.    Musculoskeletal Exam:  Full range of motion of all joints Grip strength is equal and strong bilaterally For myalgia tender points are absent  CDAI Exam: CDAI Homunculus Exam:   Joint Counts:  CDAI Tender Joint count: 0 CDAI Swollen Joint count: 0  Global Assessments:  Patient Global Assessment: 7 Provider Global Assessment: 7  CDAI Calculated Score: 14  No synovitis on examination  Investigation: No additional findings. Appointment on 01/14/2017  Component Date Value Ref Range Status  . WBC 01/14/2017 4.8  3.9 - 10.3 10e3/uL Final  . NEUT# 01/14/2017 2.6  1.5 - 6.5 10e3/uL Final  . HGB 01/14/2017 12.4  11.6 - 15.9 g/dL Final  . HCT 01/14/2017 37.8  34.8 - 46.6 % Final  . Platelets 01/14/2017 248  145 - 400 10e3/uL Final  . MCV 01/14/2017 86.3  79.5 - 101.0 fL Final  . MCH 01/14/2017 28.3  25.1 - 34.0 pg Final  . MCHC 01/14/2017 32.8  31.5 - 36.0 g/dL Final  . RBC 01/14/2017 4.38  3.70 - 5.45 10e6/uL Final  . RDW 01/14/2017 14.3  11.2 - 14.5 % Final  . lymph# 01/14/2017 1.4  0.9 - 3.3 10e3/uL Final  . MONO# 01/14/2017 0.7   0.1 - 0.9 10e3/uL Final  . Eosinophils Absolute 01/14/2017 0.1  0.0 - 0.5 10e3/uL Final  . Basophils Absolute 01/14/2017 0.0  0.0 - 0.1 10e3/uL Final  . NEUT% 01/14/2017 53.3  38.4 - 76.8 % Final  . LYMPH% 01/14/2017 29.8  14.0 - 49.7 % Final  . MONO% 01/14/2017 13.6  0.0 - 14.0 % Final  . EOS% 01/14/2017 2.9  0.0 -  7.0 % Final  . BASO% 01/14/2017 0.4  0.0 - 2.0 % Final  . Sodium 01/14/2017 141  136 - 145 mEq/L Final  . Potassium 01/14/2017 4.2  3.5 - 5.1 mEq/L Final  . Chloride 01/14/2017 103  98 - 109 mEq/L Final  . CO2 01/14/2017 26  22 - 29 mEq/L Final  . Glucose 01/14/2017 116  70 - 140 mg/dl Final   Glucose reference range is for nonfasting patients. Fasting glucose reference range is 70- 100.  Marland Kitchen BUN 01/14/2017 23.4  7.0 - 26.0 mg/dL Final  . Creatinine 82/70/7867 1.1  0.6 - 1.1 mg/dL Final  . Total Bilirubin 01/14/2017 0.26  0.20 - 1.20 mg/dL Final  . Alkaline Phosphatase 01/14/2017 78  40 - 150 U/L Final  . AST 01/14/2017 21  5 - 34 U/L Final  . ALT 01/14/2017 23  0 - 55 U/L Final  . Total Protein 01/14/2017 6.9  6.4 - 8.3 g/dL Final  . Albumin 54/49/2010 3.7  3.5 - 5.0 g/dL Final  . Calcium 03/28/1974 10.0  8.4 - 10.4 mg/dL Final  . Anion Gap 88/32/5498 11  3 - 11 mEq/L Final  . EGFR 01/14/2017 50* >90 ml/min/1.73 m2 Final   eGFR is calculated using the CKD-EPI Creatinine Equation (2009)  Appointment on 10/17/2016  Component Date Value Ref Range Status  . WBC 10/17/2016 6.5  3.9 - 10.3 10e3/uL Final  . NEUT# 10/17/2016 3.5  1.5 - 6.5 10e3/uL Final  . HGB 10/17/2016 11.7  11.6 - 15.9 g/dL Final  . HCT 26/41/5830 35.9  34.8 - 46.6 % Final  . Platelets 10/17/2016 301  145 - 400 10e3/uL Final  . MCV 10/17/2016 85.5  79.5 - 101.0 fL Final  . MCH 10/17/2016 27.9  25.1 - 34.0 pg Final  . MCHC 10/17/2016 32.6  31.5 - 36.0 g/dL Final  . RBC 94/03/6807 4.20  3.70 - 5.45 10e6/uL Final  . RDW 10/17/2016 17.7* 11.2 - 14.5 % Final  . lymph# 10/17/2016 2.0  0.9 - 3.3 10e3/uL Final  .  MONO# 10/17/2016 0.6  0.1 - 0.9 10e3/uL Final  . Eosinophils Absolute 10/17/2016 0.3  0.0 - 0.5 10e3/uL Final  . Basophils Absolute 10/17/2016 0.1  0.0 - 0.1 10e3/uL Final  . NEUT% 10/17/2016 54.0  38.4 - 76.8 % Final  . LYMPH% 10/17/2016 30.8  14.0 - 49.7 % Final  . MONO% 10/17/2016 9.2  0.0 - 14.0 % Final  . EOS% 10/17/2016 5.0  0.0 - 7.0 % Final  . BASO% 10/17/2016 1.0  0.0 - 2.0 % Final  . Sodium 10/17/2016 141  136 - 145 mEq/L Final  . Potassium 10/17/2016 4.1  3.5 - 5.1 mEq/L Final  . Chloride 10/17/2016 105  98 - 109 mEq/L Final  . CO2 10/17/2016 25  22 - 29 mEq/L Final  . Glucose 10/17/2016 123  70 - 140 mg/dl Final   Glucose reference range is for nonfasting patients. Fasting glucose reference range is 70- 100.  Marland Kitchen BUN 10/17/2016 22.9  7.0 - 26.0 mg/dL Final  . Creatinine 81/06/3158 1.1  0.6 - 1.1 mg/dL Final  . Total Bilirubin 10/17/2016 0.34  0.20 - 1.20 mg/dL Final  . Alkaline Phosphatase 10/17/2016 74  40 - 150 U/L Final  . AST 10/17/2016 22  5 - 34 U/L Final  . ALT 10/17/2016 26  0 - 55 U/L Final  . Total Protein 10/17/2016 6.8  6.4 - 8.3 g/dL Final  . Albumin 45/85/9292 3.9  3.5 -  5.0 g/dL Final  . Calcium 23/09/7207 9.9  8.4 - 10.4 mg/dL Final  . Anion Gap 10/68/1661 11  3 - 11 mEq/L Final  . EGFR 10/17/2016 51* >90 ml/min/1.73 m2 Final   eGFR is calculated using the CKD-EPI Creatinine Equation (2009)     Imaging: No results found.  Speciality Comments: No specialty comments available.    Procedures:  No procedures performed Allergies: Morphine and related   Assessment / Plan:     Visit Diagnoses: Inflammatory arthritis  High risk medications (not anticoagulants) long-term use  Primary osteoarthritis of both hands  Primary osteoarthritis of both feet  Primary osteoarthritis of both knees  DDD (degenerative disc disease), lumbar  Malignant neoplasm of upper-inner quadrant of left breast in female, estrogen receptor positive ([facility])   Plan: #1:  Inflammatory arthritis. Patient has minimal morning stiffness from inflammatory arthritis. She has generalized stiffness. Her morning stiffness is approximately 15 minutes. Recently, patient has been taking Plaquenil at 200 mg once a day. Schedule ultrasound of bilateral hands and wrist at the next available. Patient wants to do Plaquenil 200 mg once a day 7 days a week and wants to see if synovitis is present. She prefers to stay on this dose versus a 200 mg twice a day Monday through Friday. She does not notice any difference in the higher dose versus a lower dose and prefers to use a lower dose. She is concerned about the Plaquenil toxicity and would like to use a lower dose for that reason.  Note: On exam today, there is no synovitis at the MCP joints or the wrist joints. Patient has fair amount of osteoarthritis of the hands.  #2: dexa scheduled for aug 2018 at breast center ordered by dr. Jaynie Bream.  #3 history of low back pain. She is seeing [facility] for this. Please see patient's documentation in her chart. She brought a copy with her and we will scan it into her notes  #4: High-risk prescription Plaquenil 200 mg daily 7 days Plaquenil eye exam was done about 3 weeks ago and is normal  #5: Patient has had left breast surgery and is seen oncologist for this. She had labs done recently and thinks that CBC with differential and CMP with GFR were done and she'll get [place] a copy. If not, she will need to get labs for [place] by September 2018. Her last labs from April 2018 were within normal limits  #6: Return to clinic for ultrasound of bilateral hands and wrist at the next available appointment and 5 months from today for regular follow-up; Schedule patient for ultrasound of bilateral hands and wrists at the next available. She is currently using 200 mg of Plaquenil every day and we want to confirm that she is not being underdosed (patient is choosing this dose at the moment  because she fears the side effect of Plaquenil toxicity with Plaquenil).   #7: Osteoarthritis of bilateral hands, feet, knees. Ongoing discomfort.       Orders: No orders of the defined types were placed in this encounter.  No orders of the defined types were placed in this encounter.   Face-to-face time spent with patient was 30 minutes. 50% of time was spent in counseling and coordination of care.  Follow-Up Instructions: No Follow-up on file.   Tawni Pummel, PA-C I examined and evaluated the patient with Tawni Pummel PA. The plan of care was discussed as noted above.  Pollyann Savoy, MD Note - This record has been created  using Editor, commissioning.  Chart creation errors have been sought, but may not always  have been located. Such creation errors do not reflect on  the standard of medical care.

## 2017-04-01 ENCOUNTER — Ambulatory Visit: Payer: Medicare Other | Admitting: Rheumatology

## 2017-04-05 DIAGNOSIS — H35721 Serous detachment of retinal pigment epithelium, right eye: Secondary | ICD-10-CM | POA: Diagnosis not present

## 2017-04-05 DIAGNOSIS — H43821 Vitreomacular adhesion, right eye: Secondary | ICD-10-CM | POA: Diagnosis not present

## 2017-04-05 DIAGNOSIS — H353113 Nonexudative age-related macular degeneration, right eye, advanced atrophic without subfoveal involvement: Secondary | ICD-10-CM | POA: Diagnosis not present

## 2017-04-05 DIAGNOSIS — H353211 Exudative age-related macular degeneration, right eye, with active choroidal neovascularization: Secondary | ICD-10-CM | POA: Diagnosis not present

## 2017-04-05 DIAGNOSIS — H353123 Nonexudative age-related macular degeneration, left eye, advanced atrophic without subfoveal involvement: Secondary | ICD-10-CM | POA: Diagnosis not present

## 2017-04-17 ENCOUNTER — Ambulatory Visit
Admission: RE | Admit: 2017-04-17 | Discharge: 2017-04-17 | Disposition: A | Discharge status: Home / Self Care | Payer: Medicare Other | Source: Ambulatory Visit | Attending: Oncology | Admitting: Oncology

## 2017-04-17 ENCOUNTER — Other Ambulatory Visit: Payer: Self-pay | Admitting: Oncology

## 2017-04-17 DIAGNOSIS — Z170 Estrogen receptor positive status [ER+]: Principal | ICD-10-CM

## 2017-04-17 DIAGNOSIS — N6310 Unspecified lump in the right breast, unspecified quadrant: Secondary | ICD-10-CM

## 2017-04-17 DIAGNOSIS — C50212 Malignant neoplasm of upper-inner quadrant of left female breast: Secondary | ICD-10-CM

## 2017-04-17 DIAGNOSIS — E2839 Other primary ovarian failure: Secondary | ICD-10-CM

## 2017-04-17 DIAGNOSIS — Z780 Asymptomatic menopausal state: Secondary | ICD-10-CM | POA: Diagnosis not present

## 2017-04-17 DIAGNOSIS — M85851 Other specified disorders of bone density and structure, right thigh: Secondary | ICD-10-CM | POA: Diagnosis not present

## 2017-04-17 DIAGNOSIS — R928 Other abnormal and inconclusive findings on diagnostic imaging of breast: Secondary | ICD-10-CM | POA: Diagnosis not present

## 2017-05-01 DIAGNOSIS — G894 Chronic pain syndrome: Secondary | ICD-10-CM | POA: Diagnosis not present

## 2017-05-01 DIAGNOSIS — F322 Major depressive disorder, single episode, severe without psychotic features: Secondary | ICD-10-CM | POA: Diagnosis not present

## 2017-05-01 DIAGNOSIS — C50919 Malignant neoplasm of unspecified site of unspecified female breast: Secondary | ICD-10-CM | POA: Diagnosis not present

## 2017-05-01 DIAGNOSIS — Z6841 Body Mass Index (BMI) 40.0 and over, adult: Secondary | ICD-10-CM | POA: Diagnosis not present

## 2017-05-01 DIAGNOSIS — Z8679 Personal history of other diseases of the circulatory system: Secondary | ICD-10-CM | POA: Diagnosis not present

## 2017-05-01 DIAGNOSIS — E559 Vitamin D deficiency, unspecified: Secondary | ICD-10-CM | POA: Diagnosis not present

## 2017-05-01 DIAGNOSIS — G4700 Insomnia, unspecified: Secondary | ICD-10-CM | POA: Diagnosis not present

## 2017-05-01 DIAGNOSIS — F419 Anxiety disorder, unspecified: Secondary | ICD-10-CM | POA: Diagnosis not present

## 2017-05-01 DIAGNOSIS — M1990 Unspecified osteoarthritis, unspecified site: Secondary | ICD-10-CM | POA: Diagnosis not present

## 2017-05-01 DIAGNOSIS — I10 Essential (primary) hypertension: Secondary | ICD-10-CM | POA: Diagnosis not present

## 2017-05-07 DIAGNOSIS — H353211 Exudative age-related macular degeneration, right eye, with active choroidal neovascularization: Secondary | ICD-10-CM | POA: Diagnosis not present

## 2017-05-07 DIAGNOSIS — H353123 Nonexudative age-related macular degeneration, left eye, advanced atrophic without subfoveal involvement: Secondary | ICD-10-CM | POA: Diagnosis not present

## 2017-05-07 DIAGNOSIS — H353113 Nonexudative age-related macular degeneration, right eye, advanced atrophic without subfoveal involvement: Secondary | ICD-10-CM | POA: Diagnosis not present

## 2017-05-28 DIAGNOSIS — M4807 Spinal stenosis, lumbosacral region: Secondary | ICD-10-CM | POA: Diagnosis not present

## 2017-05-28 DIAGNOSIS — M5416 Radiculopathy, lumbar region: Secondary | ICD-10-CM | POA: Diagnosis not present

## 2017-05-28 DIAGNOSIS — G894 Chronic pain syndrome: Secondary | ICD-10-CM | POA: Diagnosis not present

## 2017-06-06 DIAGNOSIS — Z23 Encounter for immunization: Secondary | ICD-10-CM | POA: Diagnosis not present

## 2017-06-11 DIAGNOSIS — H43821 Vitreomacular adhesion, right eye: Secondary | ICD-10-CM | POA: Diagnosis not present

## 2017-06-11 DIAGNOSIS — H35721 Serous detachment of retinal pigment epithelium, right eye: Secondary | ICD-10-CM | POA: Diagnosis not present

## 2017-06-11 DIAGNOSIS — H353211 Exudative age-related macular degeneration, right eye, with active choroidal neovascularization: Secondary | ICD-10-CM | POA: Diagnosis not present

## 2017-06-11 DIAGNOSIS — H353113 Nonexudative age-related macular degeneration, right eye, advanced atrophic without subfoveal involvement: Secondary | ICD-10-CM | POA: Diagnosis not present

## 2017-07-02 NOTE — Progress Notes (Signed)
Patient has history of inflammatory arthritis. She is on Plaquenil. She is scheduled today for ultrasound examination of bilateral hands to rule out synovitis.

## 2017-07-03 ENCOUNTER — Other Ambulatory Visit: Payer: Self-pay

## 2017-07-03 ENCOUNTER — Inpatient Hospital Stay (INDEPENDENT_AMBULATORY_CARE_PROVIDER_SITE_OTHER): Payer: Self-pay

## 2017-07-03 ENCOUNTER — Ambulatory Visit (INDEPENDENT_AMBULATORY_CARE_PROVIDER_SITE_OTHER): Payer: Medicare Other | Admitting: Rheumatology

## 2017-07-03 DIAGNOSIS — Z170 Estrogen receptor positive status [ER+]: Principal | ICD-10-CM

## 2017-07-03 DIAGNOSIS — M79642 Pain in left hand: Secondary | ICD-10-CM | POA: Diagnosis not present

## 2017-07-03 DIAGNOSIS — C50212 Malignant neoplasm of upper-inner quadrant of left female breast: Secondary | ICD-10-CM

## 2017-07-03 DIAGNOSIS — M79641 Pain in right hand: Secondary | ICD-10-CM | POA: Diagnosis not present

## 2017-07-03 NOTE — Progress Notes (Signed)
[place]  Telephone:([phone] Fax:(336) [phone]     ID: Debra Griffith DOB: [birth year]  MR#: [id number]  AOZ#:[id number]  Patient Care Team: Josetta Huddle, MD as PCP - General (Internal Medicine) Beverlie Kurihara, Virgie Dad, MD as Consulting Physician (Oncology) Rolm Bookbinder, MD as Consulting Physician (General Surgery) Irene Limbo, MD as Consulting Physician (Plastic Surgery) Gaynelle Arabian, MD as Consulting Physician (Orthopedic Surgery) Bo Merino, MD as Consulting Physician (Rheumatology) Gardenia Phlegm, NP as Nurse Practitioner (Hematology and Oncology)  OTHER MD:  CHIEF COMPLAINT: Estrogen receptor positive breast cancer  CURRENT TREATMENT: Anastrozole   BREAST CANCER HISTORY: From the original intake note:  [name] had minor trauma to the left breast but did not think much about it until while swimming on the medication she felt a hard mass in her left breast. She brought it to medical attention and on 05/04/2016 she underwent left diagnostic mammography with tomography at the [facility]. This showed the breast density to be category B. In the left breast upper inner quadrant there was a persistent area of asymmetry measuring 4.9 cm. On exam this was a firm but poorly defined palpable mass. Biopsy of this mass 05/04/2016 showed ([id number]) an invasive ductal carcinoma, grade 1, estrogen receptor 90% positive with moderate staining intensity, progesterone receptor 90% positive with strong staining intensity, with an MIB-1 of 10%, and no HER-2 amplification, the signals ratio being 1.19 and the number per cell 1.90.  On 05/09/2016 the patient had ultrasonography of the left axilla which was benign. On the same day she had a second biopsy of what is either a second mass or a distant area of the same mass (in the same quadrant) and this showed ([id number]) invasive ductal carcinoma, grade 2, estrogen receptor 100% positive, progesterone  receptor 90% positive, both with strong staining intensity, with an MIB-1 of 15%, and no HER-2 amplification, the signals ratio being 1.50 and the number per cell 3.15.  The patient's subsequent history is as detailed below.  INTERVAL HISTORY: Donnell returns today for follow-up and treatment of her estrogen receptor positive breast cancer. She continues on anastrozole, which she tolerates well. She denies hot flashes and notes that vaginal dryness is not an issue for her. She has been started on valsartan by Dr. Inda Merlin. She has also d/c use of Eliquis by Dr. Inda Merlin. She was evaluated by Dr. Estanislado Pandy and was informed that she has osteoarthritis. She is developing trigger fingers which are alleviated with Rx hydrocone. She denies constipation while on hydrocodone.   Since her last visit to the office, she has had bone density at [facility] on 04/17/2017 that showed: T-score of -1.8. Subsequently she had a diagnostic right mammography with tomography at [facility] on 04/17/2017.   REVIEW OF SYSTEMS: [name] reports that she is not a member of a gym at this time, but voices that she "should be". She denies constipation while on hydrocodone. She denies unusual headaches, visual changes, nausea, vomiting, or dizziness. There has been no unusual cough, phlegm production, or pleurisy. This been no change in bowel or bladder habits. She denies unexplained fatigue or unexplained weight loss, bleeding, rash, or fever. A detailed review of systems was otherwise stable.    PAST MEDICAL HISTORY: Past Medical History:  Diagnosis Date  . Anemia    hx of   . Anxiety   . Breast cancer in female [facility]) 03/2016   left  . Chronic bronchitis ([place])    FLARE UPS USUALLY  ONCE A YEAR  . Chronic lower back pain   . Complication of anesthesia    trouble waking up   . CRI (chronic renal insufficiency) 07/27/2016  . DDD (degenerative disc disease), lumbar   . Depression   . GERD (gastroesophageal reflux  disease)   . History of recent fall    "twice on Sunday; once yesterday" (08/29/2016)  . Hypertension   . Inflammatory arthritis 07/27/2016   Sero Negative, Positive Synovitis hands, W[place]  . Insomnia   . Irregular heart beats   . Macular degeneration of right eye   . Obesity   . Osteoarthritis of both feet 07/27/2016  . Osteoarthritis of both hands 07/27/2016  . PONV (postoperative nausea and vomiting)    SEVERE N&V AND DIARRHEA AFTER HYSTERECTOMY AND AFTER WISDOM TEETH EXTRACTIONS - NO PROBLEMS WITH LAST 2 SURGERIES - THE HIP AND LEFT KNEE  . RBBB (right bundle branch block) 07/27/2016  . Rheumatoid arthritis [facility])    "doctor recently took me off all RX for this" (08/02/2016)  . Right bundle branch block   . Right bundle branch block   . S/p dental crown    dental crowns on every tooth  . Vitamin D deficiency     PAST SURGICAL HISTORY: Past Surgical History:  Procedure Laterality Date  . ABDOMINAL HYSTERECTOMY    . AXILLARY SURGERY Left 06/25/2016   Aspiration of left axillary seroma   . BREAST BIOPSY Left 03/2016  . BREAST LUMPECTOMY WITH RADIOACTIVE SEED AND SENTINEL LYMPH NODE BIOPSY Left 06/13/2016   Procedure: LEFT BREAST LUMPECTOMY WITH BRACKETED  RADIOACTIVE SEED AND SENTINEL LYMPH NODE BIOPSY;  Surgeon: Rolm Bookbinder, MD;  Location: [place];  Service: General;  Laterality: Left;  . BREAST RECONSTRUCTION Bilateral 06/25/2016   BILATERAL ONCOPLASTIC BREAST RECONSTRUCTION WITH BREAST REDUCTION  . BREAST RECONSTRUCTION WITH PLACEMENT OF TISSUE EXPANDER AND FLEX HD (ACELLULAR HYDRATED DERMIS) Bilateral 06/25/2016   Procedure: BILATERAL ONCOPLASTIC BREAST RECONSTRUCTION WITH BREAST REDUCTION, Aspiration of left axillary seroma;  Surgeon: Irene Limbo, MD;  Location: [place];  Service: Plastics;  Laterality: Bilateral;  . BREAST REDUCTION SURGERY Bilateral 06/25/2016   Procedure: MAMMARY REDUCTION  (BREAST) BILATERAL;  Surgeon: Irene Limbo, MD;  Location: [place];  Service:  Plastics;  Laterality: Bilateral;  . CESAREAN SECTION  1966; 1971; 1973  . COLONOSCOPY    . DEBRIDEMENT AND CLOSURE WOUND Right 07/11/2016   Procedure: DEBRIDEMENT LEFT BREAST;  Surgeon: Irene Limbo, MD;  Location: [place];  Service: Plastics;  Laterality: Right;  . JOINT REPLACEMENT    . KNEE ARTHROSCOPY Right    "before replacement"  . MASTECTOMY COMPLETE / SIMPLE Left 08/02/2016   total  . REDUCTION MAMMAPLASTY Bilateral 06/25/2016  . TOTAL HIP ARTHROPLASTY  06/24/2012   Procedure: TOTAL HIP ARTHROPLASTY;  Surgeon: [name], MD;  Location: [facility];  Service: Orthopedics;  Laterality: Left;  . TOTAL KNEE ARTHROPLASTY N/A 11/02/2013   Procedure: LEFT TOTAL KNEE ARTHROPLASTY WITH RIGHT KNEE CORTISONE INJECTION;  Surgeon: [name], MD;  Location: [facility];  Service: Orthopedics;  Laterality: N/A;  . TOTAL KNEE ARTHROPLASTY Right 05/31/2014   Procedure: RIGHT TOTAL KNEE ARTHROPLASTY;  Surgeon: [name], MD;  Location: [facility];  Service: Orthopedics;  Laterality: Right;  . TOTAL MASTECTOMY Left 08/02/2016   Procedure: LEFT TOTAL MASTECTOMY;  Surgeon: Rolm Bookbinder, MD;  Location: [place];  Service: General;  Laterality: Left;  . TUBAL LIGATION  1973  . WISDOM TOOTH EXTRACTION  1970's   admitted to  hospital for surgery    FAMILY HISTORY Family History  Problem Relation Age of Onset  . Diabetes Mother     The patient's mother died at age 29. The patient's father died at age 73 following a stroke. The patient had no brothers, 1 sister. There is no history of breast or ovarian cancer in the family.   GYNECOLOGIC HISTORY:  No LMP recorded. Patient has had a hysterectomy. Menarche age 89, first live birth age 30. The patient is GX P3. She underwent total abdominal hysterectomy, with bilateral salpingo-oophorectomy, in 1993. She used estrogen replacement approximately 9 months. She never used oral contraceptives.  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 Her husband [name] used to run for season small and similar businesses but he also is retired. Son Aaron Edelman is a Programme researcher, broadcasting/film/video in [place]. Son Legrand Como is a Engineer, maintenance (IT) in [employer]. Daughter Alexxa Sabet lives in [place]. 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 The patient has 5 grandchildren. 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    ADVANCED DIRECTIVES: In place   HEALTH MAINTENANCE: Social History  Substance Use Topics  . Smoking status: Never Smoker  . Smokeless tobacco: Never Used  . Alcohol use No     Colonoscopy: 2015/[facility]  PAP:  Bone density[facility] that showed: T-score of -1.8.    Allergies  Allergen Reactions  . Morphine And Related Other (See Comments)    hallucinations    Current Outpatient Prescriptions  Medication Sig Dispense Refill  . amLODipine (NORVASC) 10 MG tablet     . anastrozole (ARIMIDEX) 1 MG tablet TAKE ONE TABLET BY MOUTH ONCE DAILY 90 tablet 2  . apixaban (ELIQUIS) 5 MG TABS tablet Take 1 tablet (5 mg total) by mouth 2 (two) times daily. 60 tablet 2  . buPROPion (WELLBUTRIN SR) 150 MG 12 hr tablet Take 150 mg by mouth every morning.    . cholecalciferol (VITAMIN D) 1000 units tablet Take 1,000 Units by mouth daily.    . clonazePAM (KLONOPIN) 1 MG tablet Take 2 mg by mouth 2 (two) times daily.     . Cyanocobalamin (B-12) 500 MCG TABS Take 500 mcg by mouth daily.    . cyclobenzaprine (FLEXERIL) 10 MG tablet     . HYDROcodone-acetaminophen (NORCO) 10-325 MG tablet     . MAGNESIUM PO Take 2 tablets by mouth daily.    . metoprolol tartrate (LOPRESSOR) 25 MG tablet Take 25 mg by mouth 2 (two) times daily.     . Misc Natural Products (TART CHERRY ADVANCED PO) Take 1 capsule by mouth daily.    . Omega-3 Fatty Acids (FISH OIL) 1000 MG CAPS Take 1,000 mg by mouth daily.    Marland Kitchen triamterene-hydrochlorothiazide (MAXZIDE-25) 37.5-25 MG tablet     . Turmeric  500 MG CAPS Take 500 mg by mouth daily.     . vitamin C (ASCORBIC ACID) 500 MG tablet Take 1,000 mg by mouth daily.     No current facility-administered medications for this visit.    Facility-Administered Medications Ordered in Other Visits  Medication Dose Route Frequency Provider Last Rate Last Dose  . 0.9 %  sodium chloride infusion   Intravenous Continuous Rolm Bookbinder, MD      . acetaminophen (TYLENOL) tablet 650 mg  650 mg Oral Q6H PRN Rolm Bookbinder, MD       Or  . acetaminophen (TYLENOL) suppository 650  mg  650 mg Rectal Q6H PRN Rolm Bookbinder, MD      . ceFAZolin (ANCEF) 2 g in dextrose 5 % 100 mL IVPB  2 g Intravenous Q8H Rolm Bookbinder, MD      . enoxaparin (LOVENOX) injection 40 mg  40 mg Subcutaneous Q24H Rolm Bookbinder, MD      . methocarbamol (ROBAXIN) tablet 500 mg  500 mg Oral Q6H PRN Rolm Bookbinder, MD      . metroNIDAZOLE (FLAGYL) tablet 500 mg  500 mg Oral Q8H Rolm Bookbinder, MD      . ondansetron (ZOFRAN-ODT) disintegrating tablet 4 mg  4 mg Oral Q6H PRN Rolm Bookbinder, MD       Or  . ondansetron (ZOFRAN) 4 mg in sodium chloride 0.9 % 50 mL IVPB  4 mg Intravenous Q6H PRN Rolm Bookbinder, MD      . oxyCODONE (Oxy IR/ROXICODONE) immediate release tablet 5-10 mg  5-10 mg Oral Q4H PRN Rolm Bookbinder, MD      . simethicone [facility]) chewable tablet 40 mg  40 mg Oral Q6H PRN Rolm Bookbinder, MD        OBJECTIVE:Morbidly obese white woman Using a chair walker Vitals:   07/04/17 1131  BP: (!) 128/48  Pulse: 76  Resp: (!) 24  Temp: 98 F (36.7 C)  SpO2: 100%     Body mass index is 50.85 kg/m.    ECOG FS:1 - Symptomatic but completely ambulatory  Sclerae unicteric, pupils round and equal Oropharynx clear and moist No cervical or supraclavicular adenopathy Lungs no rales or rhonchi Heart regular rate and rhythm Abd soft, nontender, positive bowel sounds MSK no focal spinal tenderness, no upper extremity lymphedema Neuro:  nonfocal, well oriented, appropriate affect Breasts: The right breast is status post reduction mammoplasty. It is otherwise unremarkable. The left breast is status post mastectomy. The complex incision continues to heal nicely and there is now essentially no erythema. There is no evidence of chest wall recurrence. Both axillae are benign.   Photo of left chest wall 10/17/2016    LAB RESULTS:  CMP     Component Value Date/Time   NA 140 07/04/2017 1112   K 4.0 07/04/2017 1112   CL 107 09/02/2016 0351   CO2 28 07/04/2017 1112   GLUCOSE 106 07/04/2017 1112   BUN 26.7 (H) 07/04/2017 1112   CREATININE 1.1 07/04/2017 1112   CALCIUM 9.8 07/04/2017 1112   PROT 6.9 07/04/2017 1112   ALBUMIN 3.8 07/04/2017 1112   AST 21 07/04/2017 1112   ALT 29 07/04/2017 1112   ALKPHOS 87 07/04/2017 1112   BILITOT 0.37 07/04/2017 1112   GFRNONAA >60 09/02/2016 0351   GFRAA >60 09/02/2016 0351    INo results found for: SPEP, UPEP  Lab Results  Component Value Date   WBC 6.5 07/04/2017   NEUTROABS 3.6 07/04/2017   HGB 12.8 07/04/2017   HCT 39.6 07/04/2017   MCV 91.9 07/04/2017   PLT 262 07/04/2017      Chemistry      Component Value Date/Time   NA 140 07/04/2017 1112   K 4.0 07/04/2017 1112   CL 107 09/02/2016 0351   CO2 28 07/04/2017 1112   BUN 26.7 (H) 07/04/2017 1112   CREATININE 1.1 07/04/2017 1112   GLU 95 01/20/2016      Component Value Date/Time   CALCIUM 9.8 07/04/2017 1112   ALKPHOS 87 07/04/2017 1112   AST 21 07/04/2017 1112   ALT 29 07/04/2017 1112   BILITOT 0.37 07/04/2017  1112       No results found for: LABCA2  No components found for: LABCA125  No results for input(s): INR in the last 168 hours.  Urinalysis    Component Value Date/Time   COLORURINE YELLOW 08/29/2016 2120   APPEARANCEUR HAZY (A) 08/29/2016 2120   LABSPEC 1.014 08/29/2016 2120   PHURINE 5.0 08/29/2016 2120   GLUCOSEU NEGATIVE 08/29/2016 2120   HGBUR NEGATIVE 08/29/2016 2120   BILIRUBINUR  NEGATIVE 08/29/2016 2120   KETONESUR NEGATIVE 08/29/2016 2120   PROTEINUR NEGATIVE 08/29/2016 2120   UROBILINOGEN 0.2 05/26/2014 1054   NITRITE NEGATIVE 08/29/2016 2120   LEUKOCYTESUR LARGE (A) 08/29/2016 2120     STUDIES: Bone Density at [facility] on 04/17/2017 that showed: T-score of -1.8.   Diagnostic Right Mamm[facility] that showed: Breast density B. IMPRESSION: 1. The palpable site of concern in the medial right breast corresponds with benign fat necrosis. 2.  No mammographic evidence of right breast malignancy. 3.  Status post left breast mastectomy.   [place] Extrem Up Bilat Comp  Result Date: 07/03/2017 Ultrasound examination of bilateral hands was performed per EULAR recommendations. Using 12 MHz transducer, grayscale and power Doppler bilateral second, third, and fifth MCP joints and bilateral wrist joints both dorsal and volar aspects were evaluated to look for synovitis or tenosynovitis. The findings were there was no synovial thickening or tenosynovitis on examination. She had increased hyperemia in right hand second third and fifth MCP joint and volar aspect which she also had increased tremors which possibly caused be artifact. She had no synovitis in her left hand or her wrist joints on ultrasound examination. Right median nerve was 0.19 cm squares which was more than upper limits of normal and left median nerve was 0.14 cm squares which was more than upper limits of normal. Impression: Ultrasound examination of bilateral hands showed hyperemia in the right hand MCP joints. I believe that was due to the tremors she was experiencing. Her bilateral median nerves are enlarged but she had no symptoms of carpal tunnel syndrome.   ELIGIBLE FOR AVAILABLE RESEARCH PROTOCOL: no  ASSESSMENT: 76 y.o. [place] woman status post left breast upper inner quadrant biopsy 05/04/2016 for a clinical T2 N0 invasive ductal carcinoma, grade 1 estrogen and  progesterone receptor positive, HER-2 nonamplified, with an MIB-1 of 10%.  (1) biopsy of a second area 05/09/2016 also in the upper inner quadrant of the left breast showed invasive ductal carcinoma, grade 2, estrogen and progesterone receptor positive, HER-2 nonamplified, with an MIB-1 of 15%.  (2) status post left lumpectomy and sentinel lymph node sampling 06/13/2016 for an mpT1c pN0(i+), stage IA invasive ductal carcinoma, grade 1, with close but negative margins  (a) status post bilateral reduction mammoplasty with left oncoplastic surgery 06/25/2016  (b) status post debridement of left nipple/areolar necrosis 07/11/2016  (c) status post left simple mastectomy 08/02/2016 showing inflammation and abscess but no evidence of malignancy  (3) Oncotype score of 18 predicts a 10 year risk of recurrence outside the breast of 11% if the patient's only systemic therapy is tamoxifen for 5 years. It also predicts no significant benefit from chemotherapy  (4) postmastectomy radiation not indicated  (5) started anastrozole September 2017  (a) DEXA scan 04/17/2017 showed a T score of -1.8.  PLAN: Kajol is now just over one year out from definitive surgery for her breast cancer with no evidence of disease recurrence. This is very favorable.  She is tolerating the anastrozole well.  She does have significant osteoporosis and this complicates assessment of side effects. Nevertheless I do think she is tolerating it well and at this point the plan is to continue that for total of 5 years.  We reviewed her bone density, which is very favorable at this point.  She is having some symptoms of posttraumatic stress. I'm going to start her on venlafaxine 75 mg daily which I believe should help. We did discuss the possible toxicities side effects and complete location of this agent in detail.  She knows to call for any other issues that may develop before the next visit.  Vitor Overbaugh, Virgie Dad, MD  07/04/17 11:50  AM Medical Oncology and Hematology [facility] [address] [place], [place] [address] Tel. [phone]    Fax. [phone]  This document serves as a record of services personally performed by Lurline Del, MD. It was created on her behalf by Steva Colder, a trained medical scribe. The creation of this record is based on the scribe's personal observations and the provider's statements to them. This document has been checked and approved by the attending provider.

## 2017-07-04 ENCOUNTER — Other Ambulatory Visit (HOSPITAL_BASED_OUTPATIENT_CLINIC_OR_DEPARTMENT_OTHER): Payer: Medicare Other

## 2017-07-04 ENCOUNTER — Ambulatory Visit (HOSPITAL_BASED_OUTPATIENT_CLINIC_OR_DEPARTMENT_OTHER): Payer: Medicare Other | Admitting: Oncology

## 2017-07-04 ENCOUNTER — Telehealth: Payer: Self-pay | Admitting: Oncology

## 2017-07-04 VITALS — BP 128/48 | HR 76 | Temp 98.0°F | Resp 24 | Ht 62.0 in | Wt 278.0 lb

## 2017-07-04 DIAGNOSIS — Z170 Estrogen receptor positive status [ER+]: Secondary | ICD-10-CM

## 2017-07-04 DIAGNOSIS — M810 Age-related osteoporosis without current pathological fracture: Secondary | ICD-10-CM

## 2017-07-04 DIAGNOSIS — C50212 Malignant neoplasm of upper-inner quadrant of left female breast: Secondary | ICD-10-CM

## 2017-07-04 LAB — COMPREHENSIVE METABOLIC PANEL
ALBUMIN: 3.8 g/dL (ref 3.5–5.0)
ALK PHOS: 87 U/L (ref 40–150)
ALT: 29 U/L (ref 0–55)
ANION GAP: 9 meq/L (ref 3–11)
AST: 21 U/L (ref 5–34)
BUN: 26.7 mg/dL — AB (ref 7.0–26.0)
CALCIUM: 9.8 mg/dL (ref 8.4–10.4)
CHLORIDE: 103 meq/L (ref 98–109)
CO2: 28 mEq/L (ref 22–29)
CREATININE: 1.1 mg/dL (ref 0.6–1.1)
EGFR: 50 mL/min/{1.73_m2} — ABNORMAL LOW (ref >60)
Glucose: 106 mg/dl (ref 70–140)
Potassium: 4 mEq/L (ref 3.5–5.1)
Sodium: 140 mEq/L (ref 136–145)
Total Bilirubin: 0.37 mg/dL (ref 0.20–1.20)
Total Protein: 6.9 g/dL (ref 6.4–8.3)

## 2017-07-04 LAB — CBC WITH DIFFERENTIAL/PLATELET
BASO%: 0.5 % (ref 0.0–2.0)
Basophils Absolute: 0 10*3/uL (ref 0.0–0.1)
EOS%: 3.2 % (ref 0.0–7.0)
Eosinophils Absolute: 0.2 10*3/uL (ref 0.0–0.5)
HEMATOCRIT: 39.6 % (ref 34.8–46.6)
HEMOGLOBIN: 12.8 g/dL (ref 11.6–15.9)
LYMPH%: 30.8 % (ref 14.0–49.7)
MCH: 29.7 pg (ref 25.1–34.0)
MCHC: 32.3 g/dL (ref 31.5–36.0)
MCV: 91.9 fL (ref 79.5–101.0)
MONO#: 0.7 10*3/uL (ref 0.1–0.9)
MONO%: 10.9 % (ref 0.0–14.0)
NEUT#: 3.6 10*3/uL (ref 1.5–6.5)
NEUT%: 54.6 % (ref 38.4–76.8)
PLATELETS: 262 10*3/uL (ref 145–400)
RBC: 4.31 10*6/uL (ref 3.70–5.45)
RDW: 14 % (ref 11.2–14.5)
WBC: 6.5 10*3/uL (ref 3.9–10.3)
lymph#: 2 10*3/uL (ref 0.9–3.3)

## 2017-07-04 MED ORDER — VENLAFAXINE HCL ER 75 MG PO CP24: 75.0000 mg | ORAL_CAPSULE | Freq: Every day | ORAL | 4 refills | Status: DC

## 2017-07-04 NOTE — Telephone Encounter (Signed)
Scheduled appt per 10/18 los - Gave patient AVS and calender per los.  

## 2017-07-05 DIAGNOSIS — Z6841 Body Mass Index (BMI) 40.0 and over, adult: Secondary | ICD-10-CM | POA: Diagnosis not present

## 2017-07-05 DIAGNOSIS — C50919 Malignant neoplasm of unspecified site of unspecified female breast: Secondary | ICD-10-CM | POA: Diagnosis not present

## 2017-07-18 DIAGNOSIS — H353211 Exudative age-related macular degeneration, right eye, with active choroidal neovascularization: Secondary | ICD-10-CM | POA: Diagnosis not present

## 2017-07-18 DIAGNOSIS — H35721 Serous detachment of retinal pigment epithelium, right eye: Secondary | ICD-10-CM | POA: Diagnosis not present

## 2017-07-18 DIAGNOSIS — H43821 Vitreomacular adhesion, right eye: Secondary | ICD-10-CM | POA: Diagnosis not present

## 2017-07-18 DIAGNOSIS — H353113 Nonexudative age-related macular degeneration, right eye, advanced atrophic without subfoveal involvement: Secondary | ICD-10-CM | POA: Diagnosis not present

## 2017-07-24 DIAGNOSIS — F329 Major depressive disorder, single episode, unspecified: Secondary | ICD-10-CM | POA: Diagnosis not present

## 2017-07-24 DIAGNOSIS — M1990 Unspecified osteoarthritis, unspecified site: Secondary | ICD-10-CM | POA: Diagnosis not present

## 2017-07-24 DIAGNOSIS — M158 Other polyosteoarthritis: Secondary | ICD-10-CM | POA: Diagnosis not present

## 2017-07-24 DIAGNOSIS — E7800 Pure hypercholesterolemia, unspecified: Secondary | ICD-10-CM | POA: Diagnosis not present

## 2017-07-24 DIAGNOSIS — E782 Mixed hyperlipidemia: Secondary | ICD-10-CM | POA: Diagnosis not present

## 2017-07-24 DIAGNOSIS — I10 Essential (primary) hypertension: Secondary | ICD-10-CM | POA: Diagnosis not present

## 2017-07-24 DIAGNOSIS — C50919 Malignant neoplasm of unspecified site of unspecified female breast: Secondary | ICD-10-CM | POA: Diagnosis not present

## 2017-08-01 DIAGNOSIS — H25013 Cortical age-related cataract, bilateral: Secondary | ICD-10-CM | POA: Diagnosis not present

## 2017-08-01 DIAGNOSIS — H353211 Exudative age-related macular degeneration, right eye, with active choroidal neovascularization: Secondary | ICD-10-CM | POA: Diagnosis not present

## 2017-08-01 DIAGNOSIS — H2513 Age-related nuclear cataract, bilateral: Secondary | ICD-10-CM | POA: Diagnosis not present

## 2017-08-14 DIAGNOSIS — H2511 Age-related nuclear cataract, right eye: Secondary | ICD-10-CM | POA: Diagnosis not present

## 2017-08-14 DIAGNOSIS — H25011 Cortical age-related cataract, right eye: Secondary | ICD-10-CM | POA: Diagnosis not present

## 2017-08-21 ENCOUNTER — Other Ambulatory Visit: Payer: Self-pay | Admitting: Rheumatology

## 2017-08-21 DIAGNOSIS — H43821 Vitreomacular adhesion, right eye: Secondary | ICD-10-CM | POA: Diagnosis not present

## 2017-08-21 DIAGNOSIS — H353113 Nonexudative age-related macular degeneration, right eye, advanced atrophic without subfoveal involvement: Secondary | ICD-10-CM | POA: Diagnosis not present

## 2017-08-21 DIAGNOSIS — H353211 Exudative age-related macular degeneration, right eye, with active choroidal neovascularization: Secondary | ICD-10-CM | POA: Diagnosis not present

## 2017-08-21 DIAGNOSIS — H35721 Serous detachment of retinal pigment epithelium, right eye: Secondary | ICD-10-CM | POA: Diagnosis not present

## 2017-08-22 NOTE — Telephone Encounter (Signed)
Last Visit: 03/26/17 Next Visit: 09/26/17 Labs: 07/04/17 Stable PLQ Eye Exam WNL 02/19/17   Okay to refill per Dr. Rob Hickman

## 2017-08-27 DIAGNOSIS — M5416 Radiculopathy, lumbar region: Secondary | ICD-10-CM | POA: Diagnosis not present

## 2017-08-27 DIAGNOSIS — M4807 Spinal stenosis, lumbosacral region: Secondary | ICD-10-CM | POA: Diagnosis not present

## 2017-08-27 DIAGNOSIS — G894 Chronic pain syndrome: Secondary | ICD-10-CM | POA: Diagnosis not present

## 2017-09-05 ENCOUNTER — Other Ambulatory Visit: Payer: Self-pay | Admitting: *Deleted

## 2017-09-05 MED ORDER — VENLAFAXINE HCL ER 75 MG PO CP24: 75.0000 mg | ORAL_CAPSULE | Freq: Two times a day (BID) | ORAL | 4 refills | Status: DC

## 2017-09-09 ENCOUNTER — Other Ambulatory Visit: Payer: Self-pay | Admitting: Oncology

## 2017-09-14 NOTE — Progress Notes (Deleted)
Office Visit Note  Patient: Debra Griffith             Date of Birth: 03-Aug-1941           MRN: [id number]             PCP: Josetta Huddle, MD Referring: Josetta Huddle, MD Visit Date: 09/26/2017 Occupation: @GUAROCC @    Subjective:  No chief complaint on file.   History of Present Illness: Debra Griffith is a 76 y.o. female ***   Activities of Daily Living:  Patient reports morning stiffness for *** {minute/hour:19697}.   Patient {ACTIONS;DENIES/REPORTS:21021675::"Denies"} nocturnal pain.  Difficulty dressing/grooming: {ACTIONS;DENIES/REPORTS:21021675::"Denies"} Difficulty climbing stairs: {ACTIONS;DENIES/REPORTS:21021675::"Denies"} Difficulty getting out of chair: {ACTIONS;DENIES/REPORTS:21021675::"Denies"} Difficulty using hands for taps, buttons, cutlery, and/or writing: {ACTIONS;DENIES/REPORTS:21021675::"Denies"}   No Rheumatology ROS completed.   PMFS History:  Patient Active Problem List   Diagnosis Date Noted  . Primary osteoarthritis of both knees 10/25/2016  . New onset atrial fibrillation ([place])   . Cellulitis 08/30/2016  . Cellulitis of left breast 08/29/2016  . Dehydration 08/29/2016  . Hypotension 08/29/2016  . Confusion 08/29/2016  . RBBB (right bundle branch block) 07/27/2016  . CRI (chronic renal insufficiency) 07/27/2016  . DDD (degenerative disc disease), lumbar 07/27/2016  . Primary osteoarthritis of both feet 07/27/2016  . Primary osteoarthritis of both hands 07/27/2016  . Inflammatory arthritis 07/27/2016  . Malignant neoplasm of upper-inner quadrant of left breast in female, estrogen receptor positive ([place]) 05/31/2016  . Depression 06/03/2014  . Constipation 11/19/2013  . Insomnia 11/19/2013  . Anxiety 11/19/2013  . Status post total bilateral knee replacement 11/13/2013  . Essential hypertension   . GERD (gastroesophageal reflux disease)   . OA (osteoarthritis) of knee 11/02/2013  . Postop Sinus tachycardia 06/27/2012  . Postop  Hyponatremia 06/25/2012  . Postop Acute blood loss anemia 06/25/2012  . OA (osteoarthritis) of hip 06/24/2012    Past Medical History:  Diagnosis Date  . Anemia    hx of   . Anxiety   . Breast cancer in female [facility]) 03/2016   left  . Chronic bronchitis ([place])    FLARE UPS USUALLY ONCE A YEAR  . Chronic lower back pain   . Complication of anesthesia    trouble waking up   . CRI (chronic renal insufficiency) 07/27/2016  . DDD (degenerative disc disease), lumbar   . Depression   . GERD (gastroesophageal reflux disease)   . History of recent fall    "twice on Sunday; once yesterday" (08/29/2016)  . Hypertension   . Inflammatory arthritis 07/27/2016   Sero Negative, Positive Synovitis hands, [place]  . Insomnia   . Irregular heart beats   . Macular degeneration of right eye   . Obesity   . Osteoarthritis of both feet 07/27/2016  . Osteoarthritis of both hands 07/27/2016  . PONV (postoperative nausea and vomiting)    SEVERE N&V AND DIARRHEA AFTER HYSTERECTOMY AND AFTER WISDOM TEETH EXTRACTIONS - NO PROBLEMS WITH LAST 2 SURGERIES - THE HIP AND LEFT KNEE  . RBBB (right bundle branch block) 07/27/2016  . Rheumatoid arthritis [facility])    "doctor recently took me off all RX for this" (08/02/2016)  . Right bundle branch block   . Right bundle branch block   . S/p dental crown    dental crowns on every tooth  . Vitamin D deficiency     Family History  Problem Relation Age of Onset  . Diabetes Mother    Past Surgical History:  Procedure Laterality Date  .  ABDOMINAL HYSTERECTOMY    . AXILLARY SURGERY Left 06/25/2016   Aspiration of left axillary seroma   . BREAST BIOPSY Left 03/2016  . BREAST LUMPECTOMY WITH RADIOACTIVE SEED AND SENTINEL LYMPH NODE BIOPSY Left 06/13/2016   Procedure: LEFT BREAST LUMPECTOMY WITH BRACKETED  RADIOACTIVE SEED AND SENTINEL LYMPH NODE BIOPSY;  Surgeon: Rolm Bookbinder, MD;  Location: [place];  Service: General;  Laterality: Left;  . BREAST RECONSTRUCTION  Bilateral 06/25/2016   BILATERAL ONCOPLASTIC BREAST RECONSTRUCTION WITH BREAST REDUCTION  . BREAST RECONSTRUCTION WITH PLACEMENT OF TISSUE EXPANDER AND FLEX HD (ACELLULAR HYDRATED DERMIS) Bilateral 06/25/2016   Procedure: BILATERAL ONCOPLASTIC BREAST RECONSTRUCTION WITH BREAST REDUCTION, Aspiration of left axillary seroma;  Surgeon: Irene Limbo, MD;  Location: [place];  Service: Plastics;  Laterality: Bilateral;  . BREAST REDUCTION SURGERY Bilateral 06/25/2016   Procedure: MAMMARY REDUCTION  (BREAST) BILATERAL;  Surgeon: Irene Limbo, MD;  Location: [place];  Service: Plastics;  Laterality: Bilateral;  . CESAREAN SECTION  1966; 1971; 1973  . COLONOSCOPY    . DEBRIDEMENT AND CLOSURE WOUND Right 07/11/2016   Procedure: DEBRIDEMENT LEFT BREAST;  Surgeon: Irene Limbo, MD;  Location: [place];  Service: Plastics;  Laterality: Right;  . JOINT REPLACEMENT    . KNEE ARTHROSCOPY Right    "before replacement"  . MASTECTOMY COMPLETE / SIMPLE Left 08/02/2016   total  . REDUCTION MAMMAPLASTY Bilateral 06/25/2016  . TOTAL HIP ARTHROPLASTY  06/24/2012   Procedure: TOTAL HIP ARTHROPLASTY;  Surgeon: [name], MD;  Location: [facility];  Service: Orthopedics;  Laterality: Left;  . TOTAL KNEE ARTHROPLASTY N/A 11/02/2013   Procedure: LEFT TOTAL KNEE ARTHROPLASTY WITH RIGHT KNEE CORTISONE INJECTION;  Surgeon: [name], MD;  Location: [facility];  Service: Orthopedics;  Laterality: N/A;  . TOTAL KNEE ARTHROPLASTY Right 05/31/2014   Procedure: RIGHT TOTAL KNEE ARTHROPLASTY;  Surgeon: [name], MD;  Location: [facility];  Service: Orthopedics;  Laterality: Right;  . TOTAL MASTECTOMY Left 08/02/2016   Procedure: LEFT TOTAL MASTECTOMY;  Surgeon: Rolm Bookbinder, MD;  Location: [place];  Service: General;  Laterality: Left;  . TUBAL LIGATION  1973  . WISDOM TOOTH EXTRACTION  1970's   admitted to hospital for surgery   Social History   Social History Narrative  . Not on file     Objective: Vital  Signs: There were no vitals taken for this visit.   Physical Exam   Musculoskeletal Exam: ***  CDAI Exam: No CDAI exam completed.    Investigation: No additional findings.PLQ eye exam: 02/19/2017 CBC Latest Ref Rng & Units 07/04/2017 01/14/2017 10/17/2016  WBC 3.9 - 10.3 10e3/uL 6.5 4.8 6.5  Hemoglobin 11.6 - 15.9 g/dL 12.8 12.4 11.7  Hematocrit 34.8 - 46.6 % 39.6 37.8 35.9  Platelets 145 - 400 10e3/uL 262 248 301   CMP Latest Ref Rng & Units 07/04/2017 01/14/2017 10/17/2016  Glucose 70 - 140 mg/dl 106 116 123  BUN 7.0 - 26.0 mg/dL 26.7(H) 23.4 22.9  Creatinine 0.6 - 1.1 mg/dL 1.1 1.1 1.1  Sodium 136 - 145 mEq/L 140 141 141  Potassium 3.5 - 5.1 mEq/L 4.0 4.2 4.1  Chloride 101 - 111 mmol/L - - -  CO2 22 - 29 mEq/L 28 26 25   Calcium 8.4 - 10.4 mg/dL 9.8 10.0 9.9  Total Protein 6.4 - 8.3 g/dL 6.9 6.9 6.8  Total Bilirubin 0.20 - 1.20 mg/dL 0.37 0.26 0.34  Alkaline Phos 40 - 150 U/L 87 78 74  AST 5 - 34 U/L 21 21  22  ALT 0 - 55 U/L 29 23 26     Imaging: No results found.  Speciality Comments: PLQ Eye Exam WNL 02/19/17 @ [facility]    Procedures:  No procedures performed Allergies: Morphine and related   Assessment / Plan:     Visit Diagnoses: No diagnosis found.    Orders: No orders of the defined types were placed in this encounter.  No orders of the defined types were placed in this encounter.   Face-to-face time spent with patient was *** minutes. 50% of time was spent in counseling and coordination of care.  Follow-Up Instructions: No Follow-up on file.   Earnestine Mealing, CMA  Note - This record has been created using Editor, commissioning.  Chart creation errors have been sought, but may not always  have been located. Such creation errors do not reflect on  the standard of medical care.

## 2017-09-25 DIAGNOSIS — H35721 Serous detachment of retinal pigment epithelium, right eye: Secondary | ICD-10-CM | POA: Diagnosis not present

## 2017-09-25 DIAGNOSIS — H43822 Vitreomacular adhesion, left eye: Secondary | ICD-10-CM | POA: Diagnosis not present

## 2017-09-25 DIAGNOSIS — H353211 Exudative age-related macular degeneration, right eye, with active choroidal neovascularization: Secondary | ICD-10-CM | POA: Diagnosis not present

## 2017-09-25 DIAGNOSIS — H43821 Vitreomacular adhesion, right eye: Secondary | ICD-10-CM | POA: Diagnosis not present

## 2017-09-25 DIAGNOSIS — H353113 Nonexudative age-related macular degeneration, right eye, advanced atrophic without subfoveal involvement: Secondary | ICD-10-CM | POA: Diagnosis not present

## 2017-09-26 ENCOUNTER — Ambulatory Visit: Payer: Medicare Other | Admitting: Rheumatology

## 2017-10-16 DIAGNOSIS — H35721 Serous detachment of retinal pigment epithelium, right eye: Secondary | ICD-10-CM | POA: Diagnosis not present

## 2017-10-16 DIAGNOSIS — H43821 Vitreomacular adhesion, right eye: Secondary | ICD-10-CM | POA: Diagnosis not present

## 2017-10-24 DIAGNOSIS — H353211 Exudative age-related macular degeneration, right eye, with active choroidal neovascularization: Secondary | ICD-10-CM | POA: Diagnosis not present

## 2017-10-24 DIAGNOSIS — Z09 Encounter for follow-up examination after completed treatment for conditions other than malignant neoplasm: Secondary | ICD-10-CM | POA: Diagnosis not present

## 2017-11-07 DIAGNOSIS — F329 Major depressive disorder, single episode, unspecified: Secondary | ICD-10-CM | POA: Diagnosis not present

## 2017-11-07 DIAGNOSIS — C50919 Malignant neoplasm of unspecified site of unspecified female breast: Secondary | ICD-10-CM | POA: Diagnosis not present

## 2017-11-07 DIAGNOSIS — E538 Deficiency of other specified B group vitamins: Secondary | ICD-10-CM | POA: Diagnosis not present

## 2017-11-07 DIAGNOSIS — Z79899 Other long term (current) drug therapy: Secondary | ICD-10-CM | POA: Diagnosis not present

## 2017-11-07 DIAGNOSIS — E782 Mixed hyperlipidemia: Secondary | ICD-10-CM | POA: Diagnosis not present

## 2017-11-07 DIAGNOSIS — M158 Other polyosteoarthritis: Secondary | ICD-10-CM | POA: Diagnosis not present

## 2017-11-07 DIAGNOSIS — G894 Chronic pain syndrome: Secondary | ICD-10-CM | POA: Diagnosis not present

## 2017-11-07 DIAGNOSIS — F419 Anxiety disorder, unspecified: Secondary | ICD-10-CM | POA: Diagnosis not present

## 2017-11-07 DIAGNOSIS — E559 Vitamin D deficiency, unspecified: Secondary | ICD-10-CM | POA: Diagnosis not present

## 2017-11-07 DIAGNOSIS — R3915 Urgency of urination: Secondary | ICD-10-CM | POA: Diagnosis not present

## 2017-11-07 DIAGNOSIS — I10 Essential (primary) hypertension: Secondary | ICD-10-CM | POA: Diagnosis not present

## 2017-11-07 DIAGNOSIS — Z0000 Encounter for general adult medical examination without abnormal findings: Secondary | ICD-10-CM | POA: Diagnosis not present

## 2017-11-07 DIAGNOSIS — Z1389 Encounter for screening for other disorder: Secondary | ICD-10-CM | POA: Diagnosis not present

## 2017-11-07 DIAGNOSIS — M1990 Unspecified osteoarthritis, unspecified site: Secondary | ICD-10-CM | POA: Diagnosis not present

## 2017-11-12 DIAGNOSIS — H353211 Exudative age-related macular degeneration, right eye, with active choroidal neovascularization: Secondary | ICD-10-CM | POA: Diagnosis not present

## 2017-11-12 DIAGNOSIS — H43821 Vitreomacular adhesion, right eye: Secondary | ICD-10-CM | POA: Diagnosis not present

## 2017-11-12 DIAGNOSIS — H353123 Nonexudative age-related macular degeneration, left eye, advanced atrophic without subfoveal involvement: Secondary | ICD-10-CM | POA: Diagnosis not present

## 2017-11-12 DIAGNOSIS — Z09 Encounter for follow-up examination after completed treatment for conditions other than malignant neoplasm: Secondary | ICD-10-CM | POA: Diagnosis not present

## 2017-11-12 DIAGNOSIS — H353113 Nonexudative age-related macular degeneration, right eye, advanced atrophic without subfoveal involvement: Secondary | ICD-10-CM | POA: Diagnosis not present

## 2017-11-12 DIAGNOSIS — H35721 Serous detachment of retinal pigment epithelium, right eye: Secondary | ICD-10-CM | POA: Diagnosis not present

## 2017-11-28 DIAGNOSIS — M545 Low back pain: Secondary | ICD-10-CM | POA: Diagnosis not present

## 2017-11-28 DIAGNOSIS — G894 Chronic pain syndrome: Secondary | ICD-10-CM | POA: Diagnosis not present

## 2017-12-01 ENCOUNTER — Other Ambulatory Visit: Payer: Self-pay | Admitting: Oncology

## 2017-12-03 ENCOUNTER — Telehealth: Payer: Self-pay | Admitting: Oncology

## 2017-12-03 NOTE — Telephone Encounter (Signed)
Called patient regarding 5/10 °

## 2017-12-11 DIAGNOSIS — H43822 Vitreomacular adhesion, left eye: Secondary | ICD-10-CM | POA: Diagnosis not present

## 2017-12-11 DIAGNOSIS — H353211 Exudative age-related macular degeneration, right eye, with active choroidal neovascularization: Secondary | ICD-10-CM | POA: Diagnosis not present

## 2017-12-11 DIAGNOSIS — H353123 Nonexudative age-related macular degeneration, left eye, advanced atrophic without subfoveal involvement: Secondary | ICD-10-CM | POA: Diagnosis not present

## 2017-12-11 DIAGNOSIS — H35721 Serous detachment of retinal pigment epithelium, right eye: Secondary | ICD-10-CM | POA: Diagnosis not present

## 2017-12-11 DIAGNOSIS — H353113 Nonexudative age-related macular degeneration, right eye, advanced atrophic without subfoveal involvement: Secondary | ICD-10-CM | POA: Diagnosis not present

## 2017-12-17 DIAGNOSIS — Z961 Presence of intraocular lens: Secondary | ICD-10-CM | POA: Diagnosis not present

## 2017-12-18 ENCOUNTER — Other Ambulatory Visit: Payer: Self-pay | Admitting: Oncology

## 2018-01-02 ENCOUNTER — Ambulatory Visit: Payer: Medicare Other | Admitting: Oncology

## 2018-01-02 ENCOUNTER — Other Ambulatory Visit: Payer: Medicare Other

## 2018-01-22 ENCOUNTER — Telehealth: Payer: Self-pay | Admitting: Oncology

## 2018-01-22 NOTE — Telephone Encounter (Signed)
Spoke to patient regarding VM she left earlier to r/s upcoming  May appointments

## 2018-01-24 ENCOUNTER — Other Ambulatory Visit: Payer: Medicare Other

## 2018-01-24 ENCOUNTER — Ambulatory Visit: Payer: Medicare Other | Admitting: Oncology

## 2018-01-29 DIAGNOSIS — H353113 Nonexudative age-related macular degeneration, right eye, advanced atrophic without subfoveal involvement: Secondary | ICD-10-CM | POA: Diagnosis not present

## 2018-01-29 DIAGNOSIS — H353212 Exudative age-related macular degeneration, right eye, with inactive choroidal neovascularization: Secondary | ICD-10-CM | POA: Diagnosis not present

## 2018-01-29 DIAGNOSIS — H353123 Nonexudative age-related macular degeneration, left eye, advanced atrophic without subfoveal involvement: Secondary | ICD-10-CM | POA: Diagnosis not present

## 2018-01-29 DIAGNOSIS — H35721 Serous detachment of retinal pigment epithelium, right eye: Secondary | ICD-10-CM | POA: Diagnosis not present

## 2018-01-30 NOTE — Progress Notes (Signed)
[place]  [phone] [phone]     ID: Debra Griffith DOB: 05-19-1941  MR#: [id number]  ZHY#:[id number]  Patient Care Team: Debra Huddle, MD as PCP - General (Internal Medicine) Debra Griffith, Debra Dad, MD as Consulting Physician (Oncology) Debra Bookbinder, MD as Consulting Physician (General Surgery) Debra Limbo, MD as Consulting Physician (Plastic Surgery) Debra Arabian, MD as Consulting Physician (Orthopedic Surgery) Debra Merino, MD as Consulting Physician (Rheumatology) Debra Phlegm, NP as Nurse Practitioner (Hematology and Oncology)  OTHER MD:  CHIEF COMPLAINT: Estrogen receptor positive breast cancer  CURRENT TREATMENT: Anastrozole   BREAST CANCER HISTORY: From the original intake note:  Debra Griffith had minor trauma to the left breast but did not think much about it until while swimming on the medication she felt a hard mass in her left breast. She brought it to medical attention and on 05/04/2016 she underwent left diagnostic mammography with tomography at the [facility] Center. This showed the breast density to be category B. In the left breast upper inner quadrant there was a persistent area of asymmetry measuring 4.9 cm. On exam this was a firm but poorly defined palpable mass. Biopsy of this mass 05/04/2016 showed ([id number]) an invasive ductal carcinoma, grade 1, estrogen receptor 90% positive with moderate staining intensity, progesterone receptor 90% positive with strong staining intensity, with an MIB-1 of 10%, and no HER-2 amplification, the signals ratio being 1.19 and the number per cell 1.90.  On 05/09/2016 the patient had ultrasonography of the left axilla which was benign. On the same day she had a second biopsy of what is either a second mass or a distant area of the same mass (in the same quadrant) and this showed ([id number]) invasive ductal carcinoma, grade 2, estrogen receptor 100% positive, progesterone  receptor 90% positive, both with strong staining intensity, with an MIB-1 of 15%, and no HER-2 amplification, the signals ratio being 1.50 and the number per cell 3.15.  The patient's subsequent history is as detailed below.  INTERVAL HISTORY: Debra Griffith returns today for follow-up and treatment of her estrogen receptor positive breast cancer.   She continues on anastrozole, which she tolerates well. She denies hot flashes or vaginal dryness.  She obtains a drug in a good price  REVIEW OF SYSTEMS: Debra Griffith is doing well overall. She reports since taking venlafaxine she has been able to sleep better and no longer needs to take Ambien. For exercise she walks around the block with her walker and she also rides a stationary bike.  She uses about 10 minutes a day.  She does feel short of breath with activity however.  She has a german shepard and is unable to walk her at the moment. Her personal goal is to be able to walk her in the future. The patient denies unusual headaches, visual changes, nausea, vomiting, or dizziness. There has been no unusual cough, Griffith production, or pleurisy. This been no change in bowel or bladder habits. She denies unexplained fatigue or unexplained weight loss, bleeding, rash, or fever. A detailed review of systems was otherwise noncontributory.    PAST MEDICAL HISTORY: Past Medical History:  Diagnosis Date  . Anemia    hx of   . Anxiety   . Breast cancer in female [facility]) 03/2016   left  . Chronic bronchitis ([place])    FLARE UPS USUALLY ONCE A YEAR  . Chronic lower back pain   . Complication of anesthesia    trouble waking up   . CRI (  chronic renal insufficiency) 07/27/2016  . DDD (degenerative disc disease), lumbar   . Depression   . GERD (gastroesophageal reflux disease)   . History of recent fall    "twice on Sunday; once yesterday" (08/29/2016)  . Hypertension   . Inflammatory arthritis 07/27/2016   Sero Negative, Positive Synovitis hands, WJ  . Insomnia   .  Irregular heart beats   . Macular degeneration of right eye   . Obesity   . Osteoarthritis of both feet 07/27/2016  . Osteoarthritis of both hands 07/27/2016  . PONV (postoperative nausea and vomiting)    SEVERE N&V AND DIARRHEA AFTER HYSTERECTOMY AND AFTER WISDOM TEETH EXTRACTIONS - NO PROBLEMS WITH LAST 2 SURGERIES - THE HIP AND LEFT KNEE  . RBBB (right bundle branch block) 07/27/2016  . Rheumatoid arthritis [facility])    "doctor recently took me off all RX for this" (08/02/2016)  . Right bundle branch block   . Right bundle branch block   . S/p dental crown    dental crowns on every tooth  . Vitamin D deficiency     PAST SURGICAL HISTORY: Past Surgical History:  Procedure Laterality Date  . ABDOMINAL HYSTERECTOMY    . AXILLARY SURGERY Left 06/25/2016   Aspiration of left axillary seroma   . BREAST BIOPSY Left 03/2016  . BREAST LUMPECTOMY WITH RADIOACTIVE SEED AND SENTINEL LYMPH NODE BIOPSY Left 06/13/2016   Procedure: LEFT BREAST LUMPECTOMY WITH BRACKETED  RADIOACTIVE SEED AND SENTINEL LYMPH NODE BIOPSY;  Surgeon: Debra Bookbinder, MD;  Location: [place];  Service: General;  Laterality: Left;  . BREAST RECONSTRUCTION Bilateral 06/25/2016   BILATERAL ONCOPLASTIC BREAST RECONSTRUCTION WITH BREAST REDUCTION  . BREAST RECONSTRUCTION WITH PLACEMENT OF TISSUE EXPANDER AND FLEX HD (ACELLULAR HYDRATED DERMIS) Bilateral 06/25/2016   Procedure: BILATERAL ONCOPLASTIC BREAST RECONSTRUCTION WITH BREAST REDUCTION, Aspiration of left axillary seroma;  Surgeon: Debra Limbo, MD;  Location: [place];  Service: Plastics;  Laterality: Bilateral;  . BREAST REDUCTION SURGERY Bilateral 06/25/2016   Procedure: MAMMARY REDUCTION  (BREAST) BILATERAL;  Surgeon: Debra Limbo, MD;  Location: [place];  Service: Plastics;  Laterality: Bilateral;  . CESAREAN SECTION  1966; 1971; 1973  . COLONOSCOPY    . DEBRIDEMENT AND CLOSURE WOUND Right 07/11/2016   Procedure: DEBRIDEMENT LEFT BREAST;  Surgeon: Debra Limbo,  MD;  Location: [place];  Service: Plastics;  Laterality: Right;  . JOINT REPLACEMENT    . KNEE ARTHROSCOPY Right    "before replacement"  . MASTECTOMY COMPLETE / SIMPLE Left 08/02/2016   total  . REDUCTION MAMMAPLASTY Bilateral 06/25/2016  . TOTAL HIP ARTHROPLASTY  06/24/2012   Procedure: TOTAL HIP ARTHROPLASTY;  Surgeon: [name], MD;  Location: [facility];  Service: Orthopedics;  Laterality: Left;  . TOTAL KNEE ARTHROPLASTY N/A 11/02/2013   Procedure: LEFT TOTAL KNEE ARTHROPLASTY WITH RIGHT KNEE CORTISONE INJECTION;  Surgeon: [name], MD;  Location: [facility];  Service: Orthopedics;  Laterality: N/A;  . TOTAL KNEE ARTHROPLASTY Right 05/31/2014   Procedure: RIGHT TOTAL KNEE ARTHROPLASTY;  Surgeon: [name], MD;  Location: [facility];  Service: Orthopedics;  Laterality: Right;  . TOTAL MASTECTOMY Left 08/02/2016   Procedure: LEFT TOTAL MASTECTOMY;  Surgeon: Debra Bookbinder, MD;  Location: [place];  Service: General;  Laterality: Left;  . TUBAL LIGATION  1973  . WISDOM TOOTH EXTRACTION  1970's   admitted to hospital for surgery    FAMILY HISTORY Family History  Problem Relation Age of Onset  . Diabetes Mother     The  patient's mother died at age 57. The patient's father died at age 62 following a stroke. The patient had no brothers, 1 sister. There is no history of breast or ovarian cancer in the family.   GYNECOLOGIC HISTORY:  No LMP recorded. Patient has had a hysterectomy. Menarche age 73, first live birth age 24. The patient is GX P3. She underwent total abdominal hysterectomy, with bilateral salpingo-oophorectomy, in 1993. She used estrogen replacement approximately 9 months. She never used oral contraceptives.  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 Her husband [name] used to r be in businesses but he also is retired. Son Aaron Edelman is a Programme researcher, broadcasting/film/video in [place]. Son Legrand Como is a Engineer, maintenance (IT) in [employer]. Daughter  Elda Dunkerson lives in [place]. 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 The patient has 5 grandchildren. She is a Nurse, learning disability, currently attending [employer]: In place   HEALTH MAINTENANCE: Social History   Tobacco Use  . Smoking status: Never Smoker  . Smokeless tobacco: Never Used  Substance Use Topics  . Alcohol use: No  . Drug use: Yes    Types: Other-see comments    Comment: CBD oil     Colonoscopy: 2015/[facility]  PAP:  Bone density[facility] that showed: T-score of -1.8.    Allergies  Allergen Reactions  . Morphine And Related Other (See Comments)    hallucinations    Current Outpatient Medications  Medication Sig Dispense Refill  . amLODipine (NORVASC) 10 MG tablet     . anastrozole (ARIMIDEX) 1 MG tablet TAKE 1 TABLET BY MOUTH ONCE DAILY 90 tablet 2  . apixaban (ELIQUIS) 5 MG TABS tablet Take 1 tablet (5 mg total) by mouth 2 (two) times daily. 60 tablet 2  . buPROPion (WELLBUTRIN SR) 150 MG 12 hr tablet Take 150 mg by mouth every morning.    . cholecalciferol (VITAMIN D) 1000 units tablet Take 1,000 Units by mouth daily.    . clonazePAM (KLONOPIN) 1 MG tablet Take 2 mg by mouth 2 (two) times daily.     . Cyanocobalamin (B-12) 500 MCG TABS Take 500 mcg by mouth daily.    . cyclobenzaprine (FLEXERIL) 10 MG tablet     . HYDROcodone-acetaminophen (NORCO) 10-325 MG tablet     . hydroxychloroquine (PLAQUENIL) 200 MG tablet TAKE 1 TABLET BY MOUTH TWICE DAILY 135 tablet 1  . MAGNESIUM PO Take 2 tablets by mouth daily.    . metoprolol tartrate (LOPRESSOR) 25 MG tablet Take 25 mg by mouth 2 (two) times daily.     . Misc Natural Products (TART CHERRY ADVANCED PO) Take 1 capsule by mouth daily.    . Omega-3 Fatty Acids (FISH OIL) 1000 MG CAPS Take 1,000 mg by mouth daily.    Marland Kitchen triamterene-hydrochlorothiazide (MAXZIDE-25) 37.5-25 MG tablet     . Turmeric 500 MG CAPS Take 500 mg by mouth daily.     . valsartan  (DIOVAN) 80 MG tablet Take 80 mg by mouth daily.    Marland Kitchen venlafaxine XR (EFFEXOR-XR) 75 MG 24 hr capsule Take 1 capsule (75 mg total) by mouth 2 (two) times daily. 180 capsule 4  . vitamin C (ASCORBIC ACID) 500 MG tablet Take 1,000 mg by mouth daily.    Marland Kitchen zolpidem (AMBIEN) 5 MG tablet Take 5 mg by mouth at bedtime as needed.     No current facility-administered medications for  this visit.    Facility-Administered Medications Ordered in Other Visits  Medication Dose Route Frequency Provider Last Rate Last Dose  . 0.9 %  sodium chloride infusion   Intravenous Continuous Debra Bookbinder, MD      . acetaminophen (TYLENOL) tablet 650 mg  650 mg Oral Q6H PRN Debra Bookbinder, MD       Or  . acetaminophen (TYLENOL) suppository 650 mg  650 mg Rectal Q6H PRN Debra Bookbinder, MD      . ceFAZolin (ANCEF) 2 g in dextrose 5 % 100 mL IVPB  2 g Intravenous Q8H Debra Bookbinder, MD      . enoxaparin (LOVENOX) injection 40 mg  40 mg Subcutaneous Q24H Debra Bookbinder, MD      . methocarbamol (ROBAXIN) tablet 500 mg  500 mg Oral Q6H PRN Debra Bookbinder, MD      . metroNIDAZOLE (FLAGYL) tablet 500 mg  500 mg Oral Q8H Debra Bookbinder, MD      . ondansetron (ZOFRAN-ODT) disintegrating tablet 4 mg  4 mg Oral Q6H PRN Debra Bookbinder, MD       Or  . ondansetron (ZOFRAN) 4 mg in sodium chloride 0.9 % 50 mL IVPB  4 mg Intravenous Q6H PRN Debra Bookbinder, MD      . oxyCODONE (Oxy IR/ROXICODONE) immediate release tablet 5-10 mg  5-10 mg Oral Q4H PRN Debra Bookbinder, MD      . simethicone [facility]) chewable tablet 40 mg  40 mg Oral Q6H PRN Debra Bookbinder, MD        OBJECTIVE:Morbidly obese white woman using a chair walker  Vitals:   02/05/18 1034  BP: 133/68  Pulse: 79  Resp: 18  Temp: 98.2 F (36.8 C)  SpO2: 96%     Body mass index is 52.44 kg/m.    ECOG FS:1 - Symptomatic but completely ambulatory  Sclerae unicteric, EOMs intact Oropharynx clear and moist No cervical or  supraclavicular adenopathy Lungs no rales or rhonchi Heart regular rate and rhythm Abd soft, obese, nontender, positive bowel sounds MSK no focal spinal tenderness, no upper extremity lymphedema Neuro: nonfocal, well oriented, appropriate affect Breasts: The right breast is status post reduction mammoplasty.  It is otherwise unremarkable.  The left breast is status post mastectomy.  The incision has now healed completely and this is imaged below.  There is no evidence of local recurrence.  Both axillae are benign.  Photo of left chest wall 10/17/2016    Left Chest Wall 02/05/18       LAB RESULTS:  CMP     Component Value Date/Time   NA 143 02/05/2018 1012   NA 140 07/04/2017 1112   K 3.9 02/05/2018 1012   K 4.0 07/04/2017 1112   CL 106 02/05/2018 1012   CO2 26 02/05/2018 1012   CO2 28 07/04/2017 1112   GLUCOSE 117 02/05/2018 1012   GLUCOSE 106 07/04/2017 1112   BUN 19 02/05/2018 1012   BUN 26.7 (H) 07/04/2017 1112   CREATININE 1.14 (H) 02/05/2018 1012   CREATININE 1.1 07/04/2017 1112   CALCIUM 10.1 02/05/2018 1012   CALCIUM 9.8 07/04/2017 1112   PROT 7.1 02/05/2018 1012   PROT 6.9 07/04/2017 1112   ALBUMIN 4.0 02/05/2018 1012   ALBUMIN 3.8 07/04/2017 1112   AST 26 02/05/2018 1012   AST 21 07/04/2017 1112   ALT 37 02/05/2018 1012   ALT 29 07/04/2017 1112   ALKPHOS 99 02/05/2018 1012   ALKPHOS 87 07/04/2017 1112   BILITOT 0.3 02/05/2018 1012  BILITOT 0.37 07/04/2017 1112   GFRNONAA 46 (L) 02/05/2018 1012   GFRAA 53 (L) 02/05/2018 1012    INo results found for: SPEP, UPEP  Lab Results  Component Value Date   WBC 5.5 02/05/2018   NEUTROABS 3.3 02/05/2018   HGB 13.3 02/05/2018   HCT 40.4 02/05/2018   MCV 89.7 02/05/2018   PLT 261 02/05/2018      Chemistry      Component Value Date/Time   NA 143 02/05/2018 1012   NA 140 07/04/2017 1112   K 3.9 02/05/2018 1012   K 4.0 07/04/2017 1112   CL 106 02/05/2018 1012   CO2 26 02/05/2018 1012   CO2 28  07/04/2017 1112   BUN 19 02/05/2018 1012   BUN 26.7 (H) 07/04/2017 1112   CREATININE 1.14 (H) 02/05/2018 1012   CREATININE 1.1 07/04/2017 1112   GLU 95 01/20/2016      Component Value Date/Time   CALCIUM 10.1 02/05/2018 1012   CALCIUM 9.8 07/04/2017 1112   ALKPHOS 99 02/05/2018 1012   ALKPHOS 87 07/04/2017 1112   AST 26 02/05/2018 1012   AST 21 07/04/2017 1112   ALT 37 02/05/2018 1012   ALT 29 07/04/2017 1112   BILITOT 0.3 02/05/2018 1012   BILITOT 0.37 07/04/2017 1112       No results found for: LABCA2  No components found for: LABCA125  No results for input(s): INR in the last 168 hours.  Urinalysis    Component Value Date/Time   COLORURINE YELLOW 08/29/2016 2120   APPEARANCEUR HAZY (A) 08/29/2016 2120   LABSPEC 1.014 08/29/2016 2120   PHURINE 5.0 08/29/2016 2120   GLUCOSEU NEGATIVE 08/29/2016 2120   HGBUR NEGATIVE 08/29/2016 2120   BILIRUBINUR NEGATIVE 08/29/2016 2120   KETONESUR NEGATIVE 08/29/2016 2120   PROTEINUR NEGATIVE 08/29/2016 2120   UROBILINOGEN 0.2 05/26/2014 1054   NITRITE NEGATIVE 08/29/2016 2120   LEUKOCYTESUR LARGE (A) 08/29/2016 2120     STUDIES: She will be due for right mammography August of this yearELIGIBLE FOR AVAILABLE RESEARCH PROTOCOL: no  ASSESSMENT: 77 y.o. [place] woman status post left breast upper inner quadrant biopsy 05/04/2016 for a clinical T2 N0 invasive ductal carcinoma, grade 1 estrogen and progesterone receptor positive, HER-2 nonamplified, with an MIB-1 of 10%.  (1) biopsy of a second area 05/09/2016 also in the upper inner quadrant of the left breast showed invasive ductal carcinoma, grade 2, estrogen and progesterone receptor positive, HER-2 nonamplified, with an MIB-1 of 15%.  (2) status post left lumpectomy and sentinel lymph node sampling 06/13/2016 for an mpT1c pN0(i+), stage IA invasive ductal carcinoma, grade 1, with close but negative margins  (a) status post bilateral reduction mammoplasty with left  oncoplastic surgery 06/25/2016  (b) status post debridement of left nipple/areolar necrosis 07/11/2016  (c) status post left simple mastectomy 08/02/2016 showing inflammation and abscess but no evidence of malignancy  (3) Oncotype score of 18 predicts a 10 year risk of recurrence outside the breast of 11% if the patient's only systemic therapy is tamoxifen for 5 years. It also predicts no significant benefit from chemotherapy  (4) postmastectomy radiation not indicated  (5) started anastrozole September 2017  (a) DEXA scan 04/17/2017 showed a T score of -1.8.  PLAN: Debra Griffith is now a year and a half out from definitive surgery for breast cancer with no evidence of disease recurrence.  This is favorable.  She is tolerating anastrozole well and the plan will be to continue that a total of 5 years, possibly longer.  She is concerned about stress urinary incontinence.  We discussed ways she can deal with that and one way she will try to deal with it is to avoid every 2 hours for the next several weeks and see if she can get her bladder retrained.  If that does not work we can always refer her to urology  She is going to see me again in November.  Assuming all is well at that time I will start seeing her on a yearly basis from that point  She knows to call for any other problems that may develop before the next visit.  Debra Griffith, Debra Dad, MD  02/05/18 11:04 AM Medical Oncology and Hematology [facility] [address] [place], [place] [address] Tel. [phone]    Fax. [phone]  This document serves as a record of services personally performed by Chauncey Cruel, MD. It was created on his behalf by Margit Banda, a trained medical scribe. The creation of this record is based on the scribe's personal observations and the provider's statements to them.   I have reviewed the above documentation for accuracy and completeness, and I agree with the above.

## 2018-01-31 ENCOUNTER — Ambulatory Visit: Payer: Medicare Other | Admitting: Oncology

## 2018-01-31 ENCOUNTER — Other Ambulatory Visit: Payer: Medicare Other

## 2018-02-04 ENCOUNTER — Other Ambulatory Visit: Payer: Self-pay | Admitting: *Deleted

## 2018-02-04 DIAGNOSIS — Z170 Estrogen receptor positive status [ER+]: Principal | ICD-10-CM

## 2018-02-04 DIAGNOSIS — C50212 Malignant neoplasm of upper-inner quadrant of left female breast: Secondary | ICD-10-CM

## 2018-02-05 ENCOUNTER — Inpatient Hospital Stay: Payer: Medicare Other | Attending: Oncology

## 2018-02-05 ENCOUNTER — Telehealth: Payer: Self-pay | Admitting: Oncology

## 2018-02-05 ENCOUNTER — Inpatient Hospital Stay (HOSPITAL_BASED_OUTPATIENT_CLINIC_OR_DEPARTMENT_OTHER): Payer: Medicare Other | Admitting: Oncology

## 2018-02-05 VITALS — BP 133/68 | HR 79 | Temp 98.2°F | Resp 18 | Ht 62.0 in | Wt 286.7 lb

## 2018-02-05 DIAGNOSIS — G4700 Insomnia, unspecified: Secondary | ICD-10-CM | POA: Diagnosis not present

## 2018-02-05 DIAGNOSIS — F329 Major depressive disorder, single episode, unspecified: Secondary | ICD-10-CM | POA: Insufficient documentation

## 2018-02-05 DIAGNOSIS — R0602 Shortness of breath: Secondary | ICD-10-CM

## 2018-02-05 DIAGNOSIS — I4510 Unspecified right bundle-branch block: Secondary | ICD-10-CM

## 2018-02-05 DIAGNOSIS — K219 Gastro-esophageal reflux disease without esophagitis: Secondary | ICD-10-CM

## 2018-02-05 DIAGNOSIS — N189 Chronic kidney disease, unspecified: Secondary | ICD-10-CM | POA: Insufficient documentation

## 2018-02-05 DIAGNOSIS — F419 Anxiety disorder, unspecified: Secondary | ICD-10-CM

## 2018-02-05 DIAGNOSIS — Z79899 Other long term (current) drug therapy: Secondary | ICD-10-CM | POA: Insufficient documentation

## 2018-02-05 DIAGNOSIS — M064 Inflammatory polyarthropathy: Secondary | ICD-10-CM | POA: Insufficient documentation

## 2018-02-05 DIAGNOSIS — Z79811 Long term (current) use of aromatase inhibitors: Secondary | ICD-10-CM | POA: Diagnosis not present

## 2018-02-05 DIAGNOSIS — E669 Obesity, unspecified: Secondary | ICD-10-CM

## 2018-02-05 DIAGNOSIS — J42 Unspecified chronic bronchitis: Secondary | ICD-10-CM

## 2018-02-05 DIAGNOSIS — N393 Stress incontinence (female) (male): Secondary | ICD-10-CM | POA: Insufficient documentation

## 2018-02-05 DIAGNOSIS — D649 Anemia, unspecified: Secondary | ICD-10-CM | POA: Insufficient documentation

## 2018-02-05 DIAGNOSIS — I129 Hypertensive chronic kidney disease with stage 1 through stage 4 chronic kidney disease, or unspecified chronic kidney disease: Secondary | ICD-10-CM

## 2018-02-05 DIAGNOSIS — M5136 Other intervertebral disc degeneration, lumbar region: Secondary | ICD-10-CM | POA: Diagnosis not present

## 2018-02-05 DIAGNOSIS — E559 Vitamin D deficiency, unspecified: Secondary | ICD-10-CM | POA: Diagnosis not present

## 2018-02-05 DIAGNOSIS — Z9012 Acquired absence of left breast and nipple: Secondary | ICD-10-CM | POA: Diagnosis not present

## 2018-02-05 DIAGNOSIS — Z170 Estrogen receptor positive status [ER+]: Secondary | ICD-10-CM | POA: Diagnosis not present

## 2018-02-05 DIAGNOSIS — C50212 Malignant neoplasm of upper-inner quadrant of left female breast: Secondary | ICD-10-CM | POA: Diagnosis not present

## 2018-02-05 LAB — CBC WITH DIFFERENTIAL (CANCER CENTER ONLY)
Basophils Absolute: 0 10*3/uL (ref 0.0–0.1)
Basophils Relative: 1 %
EOS ABS: 0.2 10*3/uL (ref 0.0–0.5)
EOS PCT: 3 %
HCT: 40.4 % (ref 34.8–46.6)
Hemoglobin: 13.3 g/dL (ref 11.6–15.9)
Lymphocytes Relative: 26 %
Lymphs Abs: 1.4 10*3/uL (ref 0.9–3.3)
MCH: 29.6 pg (ref 25.1–34.0)
MCHC: 33 g/dL (ref 31.5–36.0)
MCV: 89.7 fL (ref 79.5–101.0)
MONO ABS: 0.6 10*3/uL (ref 0.1–0.9)
MONOS PCT: 10 %
Neutro Abs: 3.3 10*3/uL (ref 1.5–6.5)
Neutrophils Relative %: 60 %
PLATELETS: 261 10*3/uL (ref 145–400)
RBC: 4.51 MIL/uL (ref 3.70–5.45)
RDW: 14.5 % (ref 11.2–14.5)
WBC Count: 5.5 10*3/uL (ref 3.9–10.3)

## 2018-02-05 LAB — CMP (CANCER CENTER ONLY)
ALT: 37 U/L (ref 0–55)
AST: 26 U/L (ref 5–34)
Albumin: 4 g/dL (ref 3.5–5.0)
Alkaline Phosphatase: 99 U/L (ref 40–150)
Anion gap: 11 (ref 3–11)
BUN: 19 mg/dL (ref 7–26)
CHLORIDE: 106 mmol/L (ref 98–109)
CO2: 26 mmol/L (ref 22–29)
CREATININE: 1.14 mg/dL — AB (ref 0.60–1.10)
Calcium: 10.1 mg/dL (ref 8.4–10.4)
GFR, EST NON AFRICAN AMERICAN: 46 mL/min — AB (ref >60)
GFR, Est AFR Am: 53 mL/min — ABNORMAL LOW (ref >60)
Glucose, Bld: 117 mg/dL (ref 70–140)
POTASSIUM: 3.9 mmol/L (ref 3.5–5.1)
SODIUM: 143 mmol/L (ref 136–145)
Total Bilirubin: 0.3 mg/dL (ref 0.2–1.2)
Total Protein: 7.1 g/dL (ref 6.4–8.3)

## 2018-02-05 MED ORDER — ANASTROZOLE 1 MG PO TABS: 1.0000 mg | ORAL_TABLET | Freq: Every day | ORAL | 2 refills | Status: DC

## 2018-02-05 MED ORDER — VENLAFAXINE HCL ER 75 MG PO CP24: 75.0000 mg | ORAL_CAPSULE | Freq: Two times a day (BID) | ORAL | 4 refills | Status: DC

## 2018-02-05 NOTE — Telephone Encounter (Signed)
Gave patient AVs and calendar of upcoming November appointments.  °

## 2018-02-27 DIAGNOSIS — M545 Low back pain: Secondary | ICD-10-CM | POA: Diagnosis not present

## 2018-02-27 DIAGNOSIS — G894 Chronic pain syndrome: Secondary | ICD-10-CM | POA: Diagnosis not present

## 2018-03-31 DIAGNOSIS — I10 Essential (primary) hypertension: Secondary | ICD-10-CM | POA: Diagnosis not present

## 2018-03-31 DIAGNOSIS — M1990 Unspecified osteoarthritis, unspecified site: Secondary | ICD-10-CM | POA: Diagnosis not present

## 2018-03-31 DIAGNOSIS — C50919 Malignant neoplasm of unspecified site of unspecified female breast: Secondary | ICD-10-CM | POA: Diagnosis not present

## 2018-03-31 DIAGNOSIS — F322 Major depressive disorder, single episode, severe without psychotic features: Secondary | ICD-10-CM | POA: Diagnosis not present

## 2018-03-31 DIAGNOSIS — E782 Mixed hyperlipidemia: Secondary | ICD-10-CM | POA: Diagnosis not present

## 2018-05-09 DIAGNOSIS — Z6841 Body Mass Index (BMI) 40.0 and over, adult: Secondary | ICD-10-CM | POA: Diagnosis not present

## 2018-05-09 DIAGNOSIS — M130 Polyarthritis, unspecified: Secondary | ICD-10-CM | POA: Diagnosis not present

## 2018-05-09 DIAGNOSIS — G894 Chronic pain syndrome: Secondary | ICD-10-CM | POA: Diagnosis not present

## 2018-05-15 ENCOUNTER — Other Ambulatory Visit: Payer: Self-pay | Admitting: Internal Medicine

## 2018-05-15 DIAGNOSIS — Z1231 Encounter for screening mammogram for malignant neoplasm of breast: Secondary | ICD-10-CM

## 2018-05-28 DIAGNOSIS — H353113 Nonexudative age-related macular degeneration, right eye, advanced atrophic without subfoveal involvement: Secondary | ICD-10-CM | POA: Diagnosis not present

## 2018-05-28 DIAGNOSIS — H35722 Serous detachment of retinal pigment epithelium, left eye: Secondary | ICD-10-CM | POA: Diagnosis not present

## 2018-05-28 DIAGNOSIS — H353212 Exudative age-related macular degeneration, right eye, with inactive choroidal neovascularization: Secondary | ICD-10-CM | POA: Diagnosis not present

## 2018-05-28 DIAGNOSIS — H43822 Vitreomacular adhesion, left eye: Secondary | ICD-10-CM | POA: Diagnosis not present

## 2018-05-28 DIAGNOSIS — H3554 Dystrophies primarily involving the retinal pigment epithelium: Secondary | ICD-10-CM | POA: Diagnosis not present

## 2018-05-29 DIAGNOSIS — G894 Chronic pain syndrome: Secondary | ICD-10-CM | POA: Diagnosis not present

## 2018-05-29 DIAGNOSIS — Z79899 Other long term (current) drug therapy: Secondary | ICD-10-CM | POA: Diagnosis not present

## 2018-05-29 DIAGNOSIS — M79672 Pain in left foot: Secondary | ICD-10-CM | POA: Diagnosis not present

## 2018-06-12 DIAGNOSIS — R351 Nocturia: Secondary | ICD-10-CM | POA: Diagnosis not present

## 2018-06-12 DIAGNOSIS — H2512 Age-related nuclear cataract, left eye: Secondary | ICD-10-CM | POA: Diagnosis not present

## 2018-06-12 DIAGNOSIS — R350 Frequency of micturition: Secondary | ICD-10-CM | POA: Diagnosis not present

## 2018-06-12 DIAGNOSIS — H25012 Cortical age-related cataract, left eye: Secondary | ICD-10-CM | POA: Diagnosis not present

## 2018-06-12 DIAGNOSIS — N390 Urinary tract infection, site not specified: Secondary | ICD-10-CM | POA: Diagnosis not present

## 2018-06-12 DIAGNOSIS — N3941 Urge incontinence: Secondary | ICD-10-CM | POA: Diagnosis not present

## 2018-06-12 DIAGNOSIS — H353211 Exudative age-related macular degeneration, right eye, with active choroidal neovascularization: Secondary | ICD-10-CM | POA: Diagnosis not present

## 2018-06-12 DIAGNOSIS — Z961 Presence of intraocular lens: Secondary | ICD-10-CM | POA: Diagnosis not present

## 2018-06-19 ENCOUNTER — Ambulatory Visit
Admission: RE | Admit: 2018-06-19 | Discharge: 2018-06-19 | Disposition: A | Discharge status: Home / Self Care | Payer: Medicare Other | Source: Ambulatory Visit | Attending: Internal Medicine | Admitting: Internal Medicine

## 2018-06-19 DIAGNOSIS — Z1231 Encounter for screening mammogram for malignant neoplasm of breast: Secondary | ICD-10-CM | POA: Diagnosis not present

## 2018-06-20 ENCOUNTER — Other Ambulatory Visit: Payer: Self-pay | Admitting: Internal Medicine

## 2018-06-20 DIAGNOSIS — R928 Other abnormal and inconclusive findings on diagnostic imaging of breast: Secondary | ICD-10-CM

## 2018-06-25 ENCOUNTER — Ambulatory Visit
Admission: RE | Admit: 2018-06-25 | Discharge: 2018-06-25 | Disposition: A | Discharge status: Home / Self Care | Payer: Medicare Other | Source: Ambulatory Visit | Attending: Internal Medicine | Admitting: Internal Medicine

## 2018-06-25 ENCOUNTER — Other Ambulatory Visit: Payer: Self-pay | Admitting: Internal Medicine

## 2018-06-25 DIAGNOSIS — B951 Streptococcus, group B, as the cause of diseases classified elsewhere: Secondary | ICD-10-CM | POA: Diagnosis not present

## 2018-06-25 DIAGNOSIS — R921 Mammographic calcification found on diagnostic imaging of breast: Secondary | ICD-10-CM

## 2018-06-25 DIAGNOSIS — N3020 Other chronic cystitis without hematuria: Secondary | ICD-10-CM | POA: Diagnosis not present

## 2018-06-25 DIAGNOSIS — R928 Other abnormal and inconclusive findings on diagnostic imaging of breast: Secondary | ICD-10-CM

## 2018-06-25 DIAGNOSIS — N390 Urinary tract infection, site not specified: Secondary | ICD-10-CM | POA: Diagnosis not present

## 2018-07-02 DIAGNOSIS — H35722 Serous detachment of retinal pigment epithelium, left eye: Secondary | ICD-10-CM | POA: Diagnosis not present

## 2018-07-02 DIAGNOSIS — H353212 Exudative age-related macular degeneration, right eye, with inactive choroidal neovascularization: Secondary | ICD-10-CM | POA: Diagnosis not present

## 2018-07-02 DIAGNOSIS — H353113 Nonexudative age-related macular degeneration, right eye, advanced atrophic without subfoveal involvement: Secondary | ICD-10-CM | POA: Diagnosis not present

## 2018-07-02 DIAGNOSIS — H3554 Dystrophies primarily involving the retinal pigment epithelium: Secondary | ICD-10-CM | POA: Diagnosis not present

## 2018-07-05 DIAGNOSIS — Z23 Encounter for immunization: Secondary | ICD-10-CM | POA: Diagnosis not present

## 2018-07-09 DIAGNOSIS — H25012 Cortical age-related cataract, left eye: Secondary | ICD-10-CM | POA: Diagnosis not present

## 2018-07-09 DIAGNOSIS — H2512 Age-related nuclear cataract, left eye: Secondary | ICD-10-CM | POA: Diagnosis not present

## 2018-07-30 DIAGNOSIS — B373 Candidiasis of vulva and vagina: Secondary | ICD-10-CM | POA: Diagnosis not present

## 2018-07-30 DIAGNOSIS — N3941 Urge incontinence: Secondary | ICD-10-CM | POA: Diagnosis not present

## 2018-07-31 ENCOUNTER — Inpatient Hospital Stay: Payer: Medicare Other | Attending: Oncology

## 2018-07-31 ENCOUNTER — Inpatient Hospital Stay (HOSPITAL_BASED_OUTPATIENT_CLINIC_OR_DEPARTMENT_OTHER): Payer: Medicare Other | Admitting: Oncology

## 2018-07-31 ENCOUNTER — Telehealth: Payer: Self-pay | Admitting: Oncology

## 2018-07-31 VITALS — HR 95 | Temp 98.5°F | Resp 18 | Ht 62.0 in | Wt 284.5 lb

## 2018-07-31 DIAGNOSIS — C50212 Malignant neoplasm of upper-inner quadrant of left female breast: Secondary | ICD-10-CM

## 2018-07-31 DIAGNOSIS — M5136 Other intervertebral disc degeneration, lumbar region: Secondary | ICD-10-CM | POA: Insufficient documentation

## 2018-07-31 DIAGNOSIS — Z79811 Long term (current) use of aromatase inhibitors: Secondary | ICD-10-CM

## 2018-07-31 DIAGNOSIS — I10 Essential (primary) hypertension: Secondary | ICD-10-CM | POA: Diagnosis not present

## 2018-07-31 DIAGNOSIS — G4700 Insomnia, unspecified: Secondary | ICD-10-CM

## 2018-07-31 DIAGNOSIS — K219 Gastro-esophageal reflux disease without esophagitis: Secondary | ICD-10-CM

## 2018-07-31 DIAGNOSIS — F418 Other specified anxiety disorders: Secondary | ICD-10-CM

## 2018-07-31 DIAGNOSIS — Z90710 Acquired absence of both cervix and uterus: Secondary | ICD-10-CM

## 2018-07-31 DIAGNOSIS — Z79899 Other long term (current) drug therapy: Secondary | ICD-10-CM | POA: Diagnosis not present

## 2018-07-31 DIAGNOSIS — E669 Obesity, unspecified: Secondary | ICD-10-CM | POA: Insufficient documentation

## 2018-07-31 DIAGNOSIS — Z170 Estrogen receptor positive status [ER+]: Secondary | ICD-10-CM | POA: Insufficient documentation

## 2018-07-31 DIAGNOSIS — M069 Rheumatoid arthritis, unspecified: Secondary | ICD-10-CM | POA: Diagnosis not present

## 2018-07-31 DIAGNOSIS — E559 Vitamin D deficiency, unspecified: Secondary | ICD-10-CM

## 2018-07-31 LAB — CBC WITH DIFFERENTIAL/PLATELET
Abs Immature Granulocytes: 0.02 10*3/uL (ref 0.00–0.07)
BASOS PCT: 1 %
Basophils Absolute: 0 10*3/uL (ref 0.0–0.1)
EOS ABS: 0.2 10*3/uL (ref 0.0–0.5)
EOS PCT: 4 %
HCT: 43.7 % (ref 36.0–46.0)
Hemoglobin: 14 g/dL (ref 12.0–15.0)
Immature Granulocytes: 0 %
LYMPHS ABS: 2.5 10*3/uL (ref 0.7–4.0)
Lymphocytes Relative: 41 %
MCH: 29.4 pg (ref 26.0–34.0)
MCHC: 32 g/dL (ref 30.0–36.0)
MCV: 91.8 fL (ref 80.0–100.0)
Monocytes Absolute: 0.6 10*3/uL (ref 0.1–1.0)
Monocytes Relative: 9 %
NRBC: 0 % (ref 0.0–0.2)
Neutro Abs: 2.8 10*3/uL (ref 1.7–7.7)
Neutrophils Relative %: 45 %
PLATELETS: 235 10*3/uL (ref 150–400)
RBC: 4.76 MIL/uL (ref 3.87–5.11)
RDW: 13.3 % (ref 11.5–15.5)
WBC: 6.2 10*3/uL (ref 4.0–10.5)

## 2018-07-31 LAB — COMPREHENSIVE METABOLIC PANEL
ALK PHOS: 105 U/L (ref 38–126)
ALT: 38 U/L (ref 0–44)
ANION GAP: 10 (ref 5–15)
AST: 25 U/L (ref 15–41)
Albumin: 3.9 g/dL (ref 3.5–5.0)
BILIRUBIN TOTAL: 0.4 mg/dL (ref 0.3–1.2)
BUN: 18 mg/dL (ref 8–23)
CALCIUM: 9.7 mg/dL (ref 8.9–10.3)
CO2: 30 mmol/L (ref 22–32)
CREATININE: 0.99 mg/dL (ref 0.44–1.00)
Chloride: 102 mmol/L (ref 98–111)
GFR calc non Af Amer: 54 mL/min — ABNORMAL LOW (ref >60)
Glucose, Bld: 112 mg/dL — ABNORMAL HIGH (ref 70–99)
Potassium: 3.8 mmol/L (ref 3.5–5.1)
SODIUM: 142 mmol/L (ref 135–145)
TOTAL PROTEIN: 7 g/dL (ref 6.5–8.1)

## 2018-07-31 NOTE — Telephone Encounter (Signed)
Printed calendar and avs. °

## 2018-07-31 NOTE — Progress Notes (Signed)
[place]  [phone]) [phone] Fax:(336) [phone]     ID: GENITA NILSSON DOB: 08-Feb-1941  MR#: [id number]  ERX#:[id number]  Patient Care Team: Josetta Huddle, MD as PCP - General (Internal Medicine) Sible Straley, Virgie Dad, MD as Consulting Physician (Oncology) Rolm Bookbinder, MD as Consulting Physician (General Surgery) Irene Limbo, MD as Consulting Physician (Plastic Surgery) Gaynelle Arabian, MD as Consulting Physician (Orthopedic Surgery) Bo Merino, MD as Consulting Physician (Rheumatology) Gardenia Phlegm, NP as Nurse Practitioner (Hematology and Oncology)  OTHER MD:  CHIEF COMPLAINT: Estrogen receptor positive breast cancer  CURRENT TREATMENT: Anastrozole   BREAST CANCER HISTORY: From the original intake note:  Stachia had minor trauma to the left breast but did not think much about it until while swimming on the medication she felt a hard mass in her left breast. She brought it to medical attention and on 05/04/2016 she underwent left diagnostic mammography with tomography at the breast Center. This showed the breast density to be category B. In the left breast upper inner quadrant there was a persistent area of asymmetry measuring 4.9 cm. On exam this was a firm but poorly defined palpable mass. Biopsy of this mass 05/04/2016 showed ([id number]) an invasive ductal carcinoma, grade 1, estrogen receptor 90% positive with moderate staining intensity, progesterone receptor 90% positive with strong staining intensity, with an MIB-1 of 10%, and no HER-2 amplification, the signals ratio being 1.19 and the number per cell 1.90.  On 05/09/2016 the patient had ultrasonography of the left axilla which was benign. On the same day she had a second biopsy of what is either a second mass or a distant area of the same mass (in the same quadrant) and this showed ([id number]) invasive ductal carcinoma, grade 2, estrogen receptor 100% positive, progesterone  receptor 90% positive, both with strong staining intensity, with an MIB-1 of 15%, and no HER-2 amplification, the signals ratio being 1.50 and the number per cell 3.15.  The patient's subsequent history is as detailed below.  INTERVAL HISTORY: Anshu returns today for follow-up and treatment of her estrogen receptor positive breast cancer.   The patient continues on anastrozole, which she tolerates well, adding she loves it. She denies hot flashes.   Since her last visit here, she underwent unilateral screening right mammography with tomography 06/19/2018, showing the breast density to be category B.  There were calcifications which required further evaluation.  A digital diagnostic right breast mammogram on 06/19/2018, breast density category B, showing probable benign calcifications in the right breast.  Six-month follow-up was recommended.  REVIEW OF SYSTEMS: Zoriah is doing well. She had cataract surgery in both eyes and retinal surgery in her right eye. She endorses left sided pain. She reports when she has taken melatonin in the past, her left breast would hurt. Of note, she reports shaking more often and because of this she was unable to be checked for rheumatoid arthritis in her hands. She experiences increased urinary frequency, adding if she doesn't go every hour, she endorses urgency. She as lost some weight, but reports she has not been exercising. The patient denies unusual headaches, visual changes, nausea, vomiting, or dizziness. There has been no unusual cough, phlegm production, or pleurisy. This been no change in bowel habits. The patient denies unexplained fatigue or unexplained weight loss, bleeding, rash, or fever. A detailed review of systems was otherwise noncontributory.      PAST MEDICAL HISTORY: Past Medical History:  Diagnosis Date  . Anemia    hx  of   . Anxiety   . Breast cancer in female [facility]) 03/2016   left  . Chronic bronchitis ([place])    FLARE UPS USUALLY ONCE A  YEAR  . Chronic lower back pain   . Complication of anesthesia    trouble waking up   . CRI (chronic renal insufficiency) 07/27/2016  . DDD (degenerative disc disease), lumbar   . Depression   . GERD (gastroesophageal reflux disease)   . History of recent fall    "twice on Sunday; once yesterday" (08/29/2016)  . Hypertension   . Inflammatory arthritis 07/27/2016   Sero Negative, Positive Synovitis hands, [place]  . Insomnia   . Irregular heart beats   . Macular degeneration of right eye   . Obesity   . Osteoarthritis of both feet 07/27/2016  . Osteoarthritis of both hands 07/27/2016  . PONV (postoperative nausea and vomiting)    SEVERE N&V AND DIARRHEA AFTER HYSTERECTOMY AND AFTER WISDOM TEETH EXTRACTIONS - NO PROBLEMS WITH LAST 2 SURGERIES - THE HIP AND LEFT KNEE  . RBBB (right bundle branch block) 07/27/2016  . Rheumatoid arthritis [facility])    "doctor recently took me off all RX for this" (08/02/2016)  . Right bundle branch block   . Right bundle branch block   . S/p dental crown    dental crowns on every tooth  . Vitamin D deficiency     PAST SURGICAL HISTORY: Past Surgical History:  Procedure Laterality Date  . ABDOMINAL HYSTERECTOMY    . AXILLARY SURGERY Left 06/25/2016   Aspiration of left axillary seroma   . BREAST BIOPSY Left 03/2016  . BREAST LUMPECTOMY WITH RADIOACTIVE SEED AND SENTINEL LYMPH NODE BIOPSY Left 06/13/2016   Procedure: LEFT BREAST LUMPECTOMY WITH BRACKETED  RADIOACTIVE SEED AND SENTINEL LYMPH NODE BIOPSY;  Surgeon: Rolm Bookbinder, MD;  Location: [place];  Service: General;  Laterality: Left;  . BREAST RECONSTRUCTION Bilateral 06/25/2016   BILATERAL ONCOPLASTIC BREAST RECONSTRUCTION WITH BREAST REDUCTION  . BREAST RECONSTRUCTION WITH PLACEMENT OF TISSUE EXPANDER AND FLEX HD (ACELLULAR HYDRATED DERMIS) Bilateral 06/25/2016   Procedure: BILATERAL ONCOPLASTIC BREAST RECONSTRUCTION WITH BREAST REDUCTION, Aspiration of left axillary seroma;  Surgeon: Irene Limbo, MD;  Location: [place];  Service: Plastics;  Laterality: Bilateral;  . BREAST REDUCTION SURGERY Bilateral 06/25/2016   Procedure: MAMMARY REDUCTION  (BREAST) BILATERAL;  Surgeon: Irene Limbo, MD;  Location: [place];  Service: Plastics;  Laterality: Bilateral;  . CESAREAN SECTION  1966; 1971; 1973  . COLONOSCOPY    . DEBRIDEMENT AND CLOSURE WOUND Right 07/11/2016   Procedure: DEBRIDEMENT LEFT BREAST;  Surgeon: Irene Limbo, MD;  Location: [place];  Service: Plastics;  Laterality: Right;  . JOINT REPLACEMENT    . KNEE ARTHROSCOPY Right    "before replacement"  . MASTECTOMY COMPLETE / SIMPLE Left 08/02/2016   total  . REDUCTION MAMMAPLASTY Bilateral 06/25/2016  . TOTAL HIP ARTHROPLASTY  06/24/2012   Procedure: TOTAL HIP ARTHROPLASTY;  Surgeon: [name], MD;  Location: [facility];  Service: Orthopedics;  Laterality: Left;  . TOTAL KNEE ARTHROPLASTY N/A 11/02/2013   Procedure: LEFT TOTAL KNEE ARTHROPLASTY WITH RIGHT KNEE CORTISONE INJECTION;  Surgeon: [name], MD;  Location: [facility];  Service: Orthopedics;  Laterality: N/A;  . TOTAL KNEE ARTHROPLASTY Right 05/31/2014   Procedure: RIGHT TOTAL KNEE ARTHROPLASTY;  Surgeon: [name], MD;  Location: [facility];  Service: Orthopedics;  Laterality: Right;  . TOTAL MASTECTOMY Left 08/02/2016   Procedure: LEFT TOTAL MASTECTOMY;  Surgeon: Rolm Bookbinder,  MD;  Location: [place];  Service: General;  Laterality: Left;  . TUBAL LIGATION  1973  . WISDOM TOOTH EXTRACTION  1970's   admitted to hospital for surgery    FAMILY HISTORY Family History  Problem Relation Age of Onset  . Diabetes Mother     The patient's mother died at age 80. The patient's father died at age 63 following a stroke. The patient had no brothers, 1 sister. There is no history of breast or ovarian cancer in the family.   GYNECOLOGIC HISTORY:  No LMP recorded. Patient has had a hysterectomy. Menarche age 73, first live birth age 60. The patient is GX P3. She  underwent total abdominal hysterectomy, with bilateral salpingo-oophorectomy, in 1993. She used estrogen replacement approximately 9 months. She never used oral contraceptives.  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 Her husband [name] used to r be in businesses but he also is retired. Son Aaron Edelman is a Programme researcher, broadcasting/film/video in [place]. Son Legrand Como is a Engineer, maintenance (IT) in [employer]. Daughter Ramon Brant lives in [place]. 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 The patient has 5 grandchildren. She is a Nurse, learning disability, currently attending [employer]: In place   HEALTH MAINTENANCE: Social History   Tobacco Use  . Smoking status: Never Smoker  . Smokeless tobacco: Never Used  Substance Use Topics  . Alcohol use: No  . Drug use: Yes    Types: Other-see comments    Comment: CBD oil     Colonoscopy: 2015/[facility]  PAP:  Bone density: [facility] that showed: T-score of -1.8.    Allergies  Allergen Reactions  . Morphine And Related Other (See Comments)    hallucinations    Current Outpatient Medications  Medication Sig Dispense Refill  . amLODipine (NORVASC) 10 MG tablet 5 mg.    . anastrozole (ARIMIDEX) 1 MG tablet Take 1 tablet (1 mg total) by mouth daily. 90 tablet 2  . cholecalciferol (VITAMIN D) 1000 units tablet Take 1,000 Units by mouth daily.    . clonazePAM (KLONOPIN) 1 MG tablet Take 2 mg by mouth 2 (two) times daily.     . cyclobenzaprine (FLEXERIL) 10 MG tablet     . HYDROcodone-acetaminophen (NORCO) 10-325 MG tablet     . hydroxychloroquine (PLAQUENIL) 200 MG tablet TAKE 1 TABLET BY MOUTH TWICE DAILY 135 tablet 1  . MAGNESIUM PO Take 2 tablets by mouth daily.    . metoprolol tartrate (LOPRESSOR) 25 MG tablet Take 25 mg by mouth 2 (two) times daily.     . Misc Natural Products (TART CHERRY ADVANCED PO) Take 1 capsule by mouth daily.    . Omega-3 Fatty Acids  (FISH OIL) 1000 MG CAPS Take 1,000 mg by mouth daily.    Marland Kitchen triamterene-hydrochlorothiazide (MAXZIDE-25) 37.5-25 MG tablet     . Turmeric 500 MG CAPS Take 500 mg by mouth daily.     . valsartan (DIOVAN) 80 MG tablet Take 80 mg by mouth daily.    Marland Kitchen venlafaxine XR (EFFEXOR-XR) 75 MG 24 hr capsule Take 1 capsule (75 mg total) by mouth 2 (two) times daily. 180 capsule 4  . vitamin C (ASCORBIC ACID) 500 MG tablet Take 1,000 mg by mouth daily.    Marland Kitchen zolpidem (AMBIEN) 5 MG tablet Take 5 mg by mouth at bedtime as needed.     No current facility-administered medications for this  visit.    Facility-Administered Medications Ordered in Other Visits  Medication Dose Route Frequency Provider Last Rate Last Dose  . methocarbamol (ROBAXIN) tablet 500 mg  500 mg Oral Q6H PRN Rolm Bookbinder, MD      . metroNIDAZOLE (FLAGYL) tablet 500 mg  500 mg Oral Q8H Rolm Bookbinder, MD      . ondansetron (ZOFRAN-ODT) disintegrating tablet 4 mg  4 mg Oral Q6H PRN Rolm Bookbinder, MD       Or  . ondansetron [facility]) 4 mg in sodium chloride 0.9 % 50 mL IVPB  4 mg Intravenous Q6H PRN Rolm Bookbinder, MD      . oxyCODONE (Oxy IR/ROXICODONE) immediate release tablet 5-10 mg  5-10 mg Oral Q4H PRN Rolm Bookbinder, MD      . simethicone [facility]) chewable tablet 40 mg  40 mg Oral Q6H PRN Rolm Bookbinder, MD        OBJECTIVE:Morbidly obese white woman using a chair walker.  She had difficulty getting up on the examination table today  Vitals:   07/31/18 1020  Pulse: 95  Resp: 18  Temp: 98.5 F (36.9 C)  SpO2: 98%     Body mass index is 52.04 kg/m.    ECOG FS:2 - Symptomatic, <50% confined to bed  Sclerae unicteric, EOMs intact No cervical or supraclavicular adenopathy Lungs no rales or rhonchi Heart regular rate and rhythm Abd soft, nontender, positive bowel sounds MSK no focal spinal tenderness Neuro: nonfocal, well oriented, appropriate affect Breasts: The right breast has undergone reduction  mammoplasty.  There are no suspicious findings.  The left breast is status post mastectomy.  There is no evidence of chest wall recurrence.  The area where she has discomfort, in the lateral most aspect of her incision, is simply fatty tissue.  There are no suspicious findings  Photo of left chest wall 10/17/2016    Left Chest Wall 02/05/18       LAB RESULTS:  CMP     Component Value Date/Time   NA 142 07/31/2018 1001   NA 140 07/04/2017 1112   K 3.8 07/31/2018 1001   K 4.0 07/04/2017 1112   CL 102 07/31/2018 1001   CO2 30 07/31/2018 1001   CO2 28 07/04/2017 1112   GLUCOSE 112 (H) 07/31/2018 1001   GLUCOSE 106 07/04/2017 1112   BUN 18 07/31/2018 1001   BUN 26.7 (H) 07/04/2017 1112   CREATININE 0.99 07/31/2018 1001   CREATININE 1.14 (H) 02/05/2018 1012   CREATININE 1.1 07/04/2017 1112   CALCIUM 9.7 07/31/2018 1001   CALCIUM 9.8 07/04/2017 1112   PROT 7.0 07/31/2018 1001   PROT 6.9 07/04/2017 1112   ALBUMIN 3.9 07/31/2018 1001   ALBUMIN 3.8 07/04/2017 1112   AST 25 07/31/2018 1001   AST 26 02/05/2018 1012   AST 21 07/04/2017 1112   ALT 38 07/31/2018 1001   ALT 37 02/05/2018 1012   ALT 29 07/04/2017 1112   ALKPHOS 105 07/31/2018 1001   ALKPHOS 87 07/04/2017 1112   BILITOT 0.4 07/31/2018 1001   BILITOT 0.3 02/05/2018 1012   BILITOT 0.37 07/04/2017 1112   GFRNONAA 54 (L) 07/31/2018 1001   GFRNONAA 46 (L) 02/05/2018 1012   GFRAA >60 07/31/2018 1001   GFRAA 53 (L) 02/05/2018 1012    INo results found for: SPEP, UPEP  Lab Results  Component Value Date   WBC 6.2 07/31/2018   NEUTROABS 2.8 07/31/2018   HGB 14.0 07/31/2018   HCT 43.7 07/31/2018   MCV 91.8 07/31/2018  PLT 235 07/31/2018      Chemistry      Component Value Date/Time   NA 142 07/31/2018 1001   NA 140 07/04/2017 1112   K 3.8 07/31/2018 1001   K 4.0 07/04/2017 1112   CL 102 07/31/2018 1001   CO2 30 07/31/2018 1001   CO2 28 07/04/2017 1112   BUN 18 07/31/2018 1001   BUN 26.7 (H) 07/04/2017  1112   CREATININE 0.99 07/31/2018 1001   CREATININE 1.14 (H) 02/05/2018 1012   CREATININE 1.1 07/04/2017 1112   GLU 95 01/20/2016      Component Value Date/Time   CALCIUM 9.7 07/31/2018 1001   CALCIUM 9.8 07/04/2017 1112   ALKPHOS 105 07/31/2018 1001   ALKPHOS 87 07/04/2017 1112   AST 25 07/31/2018 1001   AST 26 02/05/2018 1012   AST 21 07/04/2017 1112   ALT 38 07/31/2018 1001   ALT 37 02/05/2018 1012   ALT 29 07/04/2017 1112   BILITOT 0.4 07/31/2018 1001   BILITOT 0.3 02/05/2018 1012   BILITOT 0.37 07/04/2017 1112       No results found for: LABCA2  No components found for: LABCA125  No results for input(s): INR in the last 168 hours.  Urinalysis    Component Value Date/Time   COLORURINE YELLOW 08/29/2016 2120   APPEARANCEUR HAZY (A) 08/29/2016 2120   LABSPEC 1.014 08/29/2016 2120   PHURINE 5.0 08/29/2016 2120   GLUCOSEU NEGATIVE 08/29/2016 2120   HGBUR NEGATIVE 08/29/2016 2120   BILIRUBINUR NEGATIVE 08/29/2016 2120   KETONESUR NEGATIVE 08/29/2016 2120   PROTEINUR NEGATIVE 08/29/2016 2120   UROBILINOGEN 0.2 05/26/2014 1054   NITRITE NEGATIVE 08/29/2016 2120   LEUKOCYTESUR LARGE (A) 08/29/2016 2120     STUDIES: Digital Diagnostic Right Breast Mammogram on 06/19/2018, breast density category B, showing probable benign calcifications in the right breast.  ELIGIBLE FOR AVAILABLE RESEARCH PROTOCOL: no  ASSESSMENT: 76 y.o. [place] woman status post left breast upper inner quadrant biopsy 05/04/2016 for a clinical T2 N0 invasive ductal carcinoma, grade 1 estrogen and progesterone receptor positive, HER-2 nonamplified, with an MIB-1 of 10%.  (1) biopsy of a second area 05/09/2016 also in the upper inner quadrant of the left breast showed invasive ductal carcinoma, grade 2, estrogen and progesterone receptor positive, HER-2 nonamplified, with an MIB-1 of 15%.  (2) status post left lumpectomy and sentinel lymph node sampling 06/13/2016 for an mpT1c pN0(i+), stage IA  invasive ductal carcinoma, grade 1, with close but negative margins  (a) status post bilateral reduction mammoplasty with left oncoplastic surgery 06/25/2016  (b) status post debridement of left nipple/areolar necrosis 07/11/2016  (c) status post left simple mastectomy 08/02/2016 showing inflammation and abscess but no evidence of malignancy  (3) Oncotype score of 18 predicts a 10 year risk of recurrence outside the breast of 11% if the patient's only systemic therapy is tamoxifen for 5 years. It also predicts no significant benefit from chemotherapy  (4) postmastectomy radiation not indicated  (5) started anastrozole September 2017  (a) DEXA scan 04/17/2017 showed a T score of -1.8.  PLAN: Tandrea is now a little over 2 years out from definitive surgery for breast cancer with no evidence of disease recurrence.  This is very favorable.  She is tolerating the anastrozole well and the plan will be to continue that a total of 5 years.  I have encouraged her to continue her low carbohydrate diet.  It seems to be working for her and it does not seem difficult for her  to keep to it.  She will have repeat mammography in April.  She is going to see me in May.  If all goes well at that time we may start seeing her on a once a year basis thereafter  She knows to call for any other issues that may develop before the next visit. Xela Oregel, Virgie Dad, MD  07/31/18 10:53 AM Medical Oncology and Hematology [facility] [address] [place], [place] [address] Tel. [phone]    Fax. [phone]  I, Margit Banda am acting as a scribe for Chauncey Cruel, MD.   I, Lurline Del MD, have reviewed the above documentation for accuracy and completeness, and I agree with the above.

## 2018-08-21 DIAGNOSIS — H35722 Serous detachment of retinal pigment epithelium, left eye: Secondary | ICD-10-CM | POA: Diagnosis not present

## 2018-08-21 DIAGNOSIS — H353212 Exudative age-related macular degeneration, right eye, with inactive choroidal neovascularization: Secondary | ICD-10-CM | POA: Diagnosis not present

## 2018-08-21 DIAGNOSIS — H43822 Vitreomacular adhesion, left eye: Secondary | ICD-10-CM | POA: Diagnosis not present

## 2018-08-21 DIAGNOSIS — H353123 Nonexudative age-related macular degeneration, left eye, advanced atrophic without subfoveal involvement: Secondary | ICD-10-CM | POA: Diagnosis not present

## 2018-08-21 DIAGNOSIS — H3554 Dystrophies primarily involving the retinal pigment epithelium: Secondary | ICD-10-CM | POA: Diagnosis not present

## 2018-08-28 DIAGNOSIS — G894 Chronic pain syndrome: Secondary | ICD-10-CM | POA: Diagnosis not present

## 2018-08-28 DIAGNOSIS — M545 Low back pain: Secondary | ICD-10-CM | POA: Diagnosis not present

## 2018-08-28 DIAGNOSIS — Z79899 Other long term (current) drug therapy: Secondary | ICD-10-CM | POA: Diagnosis not present

## 2018-09-24 DIAGNOSIS — R350 Frequency of micturition: Secondary | ICD-10-CM | POA: Diagnosis not present

## 2018-09-24 DIAGNOSIS — N3941 Urge incontinence: Secondary | ICD-10-CM | POA: Diagnosis not present

## 2018-09-24 DIAGNOSIS — N139 Obstructive and reflux uropathy, unspecified: Secondary | ICD-10-CM | POA: Diagnosis not present

## 2019-01-22 ENCOUNTER — Other Ambulatory Visit: Payer: Self-pay

## 2019-01-22 ENCOUNTER — Ambulatory Visit
Admission: RE | Admit: 2019-01-22 | Discharge: 2019-01-22 | Disposition: A | Discharge status: Home / Self Care | Payer: Medicare Other | Source: Ambulatory Visit | Attending: Internal Medicine | Admitting: Internal Medicine

## 2019-01-22 DIAGNOSIS — R921 Mammographic calcification found on diagnostic imaging of breast: Secondary | ICD-10-CM | POA: Diagnosis not present

## 2019-01-27 DIAGNOSIS — H43822 Vitreomacular adhesion, left eye: Secondary | ICD-10-CM | POA: Diagnosis not present

## 2019-01-27 DIAGNOSIS — H353212 Exudative age-related macular degeneration, right eye, with inactive choroidal neovascularization: Secondary | ICD-10-CM | POA: Diagnosis not present

## 2019-01-27 DIAGNOSIS — H35722 Serous detachment of retinal pigment epithelium, left eye: Secondary | ICD-10-CM | POA: Diagnosis not present

## 2019-01-27 DIAGNOSIS — H3554 Dystrophies primarily involving the retinal pigment epithelium: Secondary | ICD-10-CM | POA: Diagnosis not present

## 2019-01-27 DIAGNOSIS — H353113 Nonexudative age-related macular degeneration, right eye, advanced atrophic without subfoveal involvement: Secondary | ICD-10-CM | POA: Diagnosis not present

## 2019-01-30 ENCOUNTER — Telehealth: Payer: Self-pay | Admitting: Oncology

## 2019-01-30 NOTE — Telephone Encounter (Signed)
Spoke with patient re changing 5/19 f/u from GM to [facility]. Per patient she just had her mammo and it was determined that the deposits in the breast were calcium deposits. Patient would like to move appointment out and would like GM to decided how far out appointment can be pushed out.  Message routed to GM.

## 2019-02-02 DIAGNOSIS — I10 Essential (primary) hypertension: Secondary | ICD-10-CM | POA: Diagnosis not present

## 2019-02-02 DIAGNOSIS — C50919 Malignant neoplasm of unspecified site of unspecified female breast: Secondary | ICD-10-CM | POA: Diagnosis not present

## 2019-02-02 DIAGNOSIS — Z0001 Encounter for general adult medical examination with abnormal findings: Secondary | ICD-10-CM | POA: Diagnosis not present

## 2019-02-02 DIAGNOSIS — G894 Chronic pain syndrome: Secondary | ICD-10-CM | POA: Diagnosis not present

## 2019-02-02 DIAGNOSIS — E782 Mixed hyperlipidemia: Secondary | ICD-10-CM | POA: Diagnosis not present

## 2019-02-02 DIAGNOSIS — M130 Polyarthritis, unspecified: Secondary | ICD-10-CM | POA: Diagnosis not present

## 2019-02-02 DIAGNOSIS — M1990 Unspecified osteoarthritis, unspecified site: Secondary | ICD-10-CM | POA: Diagnosis not present

## 2019-02-02 DIAGNOSIS — F419 Anxiety disorder, unspecified: Secondary | ICD-10-CM | POA: Diagnosis not present

## 2019-02-02 DIAGNOSIS — F322 Major depressive disorder, single episode, severe without psychotic features: Secondary | ICD-10-CM | POA: Diagnosis not present

## 2019-02-02 DIAGNOSIS — Z8679 Personal history of other diseases of the circulatory system: Secondary | ICD-10-CM | POA: Diagnosis not present

## 2019-02-03 ENCOUNTER — Other Ambulatory Visit: Payer: Self-pay | Admitting: Adult Health

## 2019-02-03 ENCOUNTER — Other Ambulatory Visit: Payer: Medicare Other

## 2019-02-03 ENCOUNTER — Inpatient Hospital Stay: Payer: Medicare Other | Admitting: Oncology

## 2019-02-03 ENCOUNTER — Inpatient Hospital Stay: Payer: Medicare Other

## 2019-02-03 ENCOUNTER — Telehealth: Payer: Self-pay | Admitting: *Deleted

## 2019-02-03 ENCOUNTER — Ambulatory Visit: Payer: Medicare Other | Admitting: Adult Health

## 2019-02-03 NOTE — Telephone Encounter (Signed)
REFERRAL SENT TO SCHEDULING AND NOTES ON FILE FROM DR. ROBERT GATES [phone]

## 2019-02-04 ENCOUNTER — Telehealth: Payer: Self-pay | Admitting: Oncology

## 2019-02-04 DIAGNOSIS — G894 Chronic pain syndrome: Secondary | ICD-10-CM | POA: Diagnosis not present

## 2019-02-04 NOTE — Telephone Encounter (Signed)
Scheduled appt per sch msg. Called and spoke with patient. Confirmed date and time °

## 2019-02-15 DIAGNOSIS — M158 Other polyosteoarthritis: Secondary | ICD-10-CM | POA: Diagnosis not present

## 2019-02-15 DIAGNOSIS — Z853 Personal history of malignant neoplasm of breast: Secondary | ICD-10-CM | POA: Diagnosis not present

## 2019-02-15 DIAGNOSIS — F329 Major depressive disorder, single episode, unspecified: Secondary | ICD-10-CM | POA: Diagnosis not present

## 2019-02-15 DIAGNOSIS — M1990 Unspecified osteoarthritis, unspecified site: Secondary | ICD-10-CM | POA: Diagnosis not present

## 2019-02-15 DIAGNOSIS — I10 Essential (primary) hypertension: Secondary | ICD-10-CM | POA: Diagnosis not present

## 2019-02-15 DIAGNOSIS — E7800 Pure hypercholesterolemia, unspecified: Secondary | ICD-10-CM | POA: Diagnosis not present

## 2019-02-15 DIAGNOSIS — F322 Major depressive disorder, single episode, severe without psychotic features: Secondary | ICD-10-CM | POA: Diagnosis not present

## 2019-02-15 DIAGNOSIS — E782 Mixed hyperlipidemia: Secondary | ICD-10-CM | POA: Diagnosis not present

## 2019-02-15 DIAGNOSIS — C50919 Malignant neoplasm of unspecified site of unspecified female breast: Secondary | ICD-10-CM | POA: Diagnosis not present

## 2019-03-03 DIAGNOSIS — M1990 Unspecified osteoarthritis, unspecified site: Secondary | ICD-10-CM | POA: Diagnosis not present

## 2019-03-03 DIAGNOSIS — I10 Essential (primary) hypertension: Secondary | ICD-10-CM | POA: Diagnosis not present

## 2019-03-03 DIAGNOSIS — Z853 Personal history of malignant neoplasm of breast: Secondary | ICD-10-CM | POA: Diagnosis not present

## 2019-03-03 DIAGNOSIS — F322 Major depressive disorder, single episode, severe without psychotic features: Secondary | ICD-10-CM | POA: Diagnosis not present

## 2019-03-03 DIAGNOSIS — F329 Major depressive disorder, single episode, unspecified: Secondary | ICD-10-CM | POA: Diagnosis not present

## 2019-03-03 DIAGNOSIS — M158 Other polyosteoarthritis: Secondary | ICD-10-CM | POA: Diagnosis not present

## 2019-03-03 DIAGNOSIS — E782 Mixed hyperlipidemia: Secondary | ICD-10-CM | POA: Diagnosis not present

## 2019-03-03 DIAGNOSIS — C50919 Malignant neoplasm of unspecified site of unspecified female breast: Secondary | ICD-10-CM | POA: Diagnosis not present

## 2019-03-05 ENCOUNTER — Other Ambulatory Visit: Payer: Self-pay | Admitting: Oncology

## 2019-03-06 DIAGNOSIS — G894 Chronic pain syndrome: Secondary | ICD-10-CM | POA: Diagnosis not present

## 2019-03-24 DIAGNOSIS — M130 Polyarthritis, unspecified: Secondary | ICD-10-CM | POA: Diagnosis not present

## 2019-03-24 DIAGNOSIS — Z8679 Personal history of other diseases of the circulatory system: Secondary | ICD-10-CM | POA: Diagnosis not present

## 2019-03-24 DIAGNOSIS — M1990 Unspecified osteoarthritis, unspecified site: Secondary | ICD-10-CM | POA: Diagnosis not present

## 2019-03-24 DIAGNOSIS — I10 Essential (primary) hypertension: Secondary | ICD-10-CM | POA: Diagnosis not present

## 2019-03-24 DIAGNOSIS — F322 Major depressive disorder, single episode, severe without psychotic features: Secondary | ICD-10-CM | POA: Diagnosis not present

## 2019-03-24 DIAGNOSIS — Z853 Personal history of malignant neoplasm of breast: Secondary | ICD-10-CM | POA: Diagnosis not present

## 2019-03-24 DIAGNOSIS — E782 Mixed hyperlipidemia: Secondary | ICD-10-CM | POA: Diagnosis not present

## 2019-03-24 DIAGNOSIS — H3530 Unspecified macular degeneration: Secondary | ICD-10-CM | POA: Diagnosis not present

## 2019-03-24 DIAGNOSIS — E559 Vitamin D deficiency, unspecified: Secondary | ICD-10-CM | POA: Diagnosis not present

## 2019-04-10 ENCOUNTER — Other Ambulatory Visit: Payer: Self-pay | Admitting: Oncology

## 2019-04-17 DIAGNOSIS — I10 Essential (primary) hypertension: Secondary | ICD-10-CM | POA: Diagnosis not present

## 2019-04-17 DIAGNOSIS — E782 Mixed hyperlipidemia: Secondary | ICD-10-CM | POA: Diagnosis not present

## 2019-04-17 DIAGNOSIS — F329 Major depressive disorder, single episode, unspecified: Secondary | ICD-10-CM | POA: Diagnosis not present

## 2019-04-17 DIAGNOSIS — Z853 Personal history of malignant neoplasm of breast: Secondary | ICD-10-CM | POA: Diagnosis not present

## 2019-04-17 DIAGNOSIS — M1990 Unspecified osteoarthritis, unspecified site: Secondary | ICD-10-CM | POA: Diagnosis not present

## 2019-04-17 DIAGNOSIS — C50919 Malignant neoplasm of unspecified site of unspecified female breast: Secondary | ICD-10-CM | POA: Diagnosis not present

## 2019-04-17 DIAGNOSIS — F322 Major depressive disorder, single episode, severe without psychotic features: Secondary | ICD-10-CM | POA: Diagnosis not present

## 2019-04-17 DIAGNOSIS — M158 Other polyosteoarthritis: Secondary | ICD-10-CM | POA: Diagnosis not present

## 2019-04-21 DIAGNOSIS — M158 Other polyosteoarthritis: Secondary | ICD-10-CM | POA: Diagnosis not present

## 2019-04-21 DIAGNOSIS — C50919 Malignant neoplasm of unspecified site of unspecified female breast: Secondary | ICD-10-CM | POA: Diagnosis not present

## 2019-04-21 DIAGNOSIS — M1990 Unspecified osteoarthritis, unspecified site: Secondary | ICD-10-CM | POA: Diagnosis not present

## 2019-04-21 DIAGNOSIS — Z853 Personal history of malignant neoplasm of breast: Secondary | ICD-10-CM | POA: Diagnosis not present

## 2019-04-21 DIAGNOSIS — F322 Major depressive disorder, single episode, severe without psychotic features: Secondary | ICD-10-CM | POA: Diagnosis not present

## 2019-04-21 DIAGNOSIS — E782 Mixed hyperlipidemia: Secondary | ICD-10-CM | POA: Diagnosis not present

## 2019-04-21 DIAGNOSIS — F329 Major depressive disorder, single episode, unspecified: Secondary | ICD-10-CM | POA: Diagnosis not present

## 2019-04-21 DIAGNOSIS — I10 Essential (primary) hypertension: Secondary | ICD-10-CM | POA: Diagnosis not present

## 2019-06-02 ENCOUNTER — Ambulatory Visit: Payer: Medicare Other | Admitting: Cardiology

## 2019-06-05 ENCOUNTER — Other Ambulatory Visit: Payer: Self-pay | Admitting: Oncology

## 2019-06-08 ENCOUNTER — Other Ambulatory Visit: Payer: Self-pay | Admitting: *Deleted

## 2019-06-08 NOTE — Progress Notes (Signed)
[place]  [phone] [phone]     ID: Debra Griffith DOB: [birth year]  MR#: [id number]  MAY#:[id number]  Patient Care Team: Josetta Huddle, MD as PCP - General (Internal Medicine) , Virgie Dad, MD as Consulting Physician (Oncology) Rolm Bookbinder, MD as Consulting Physician (General Surgery) Irene Limbo, MD as Consulting Physician (Plastic Surgery) Gaynelle Arabian, MD as Consulting Physician (Orthopedic Surgery) Bo Merino, MD as Consulting Physician (Rheumatology) Gardenia Phlegm, NP as Nurse Practitioner (Hematology and Oncology)  OTHER MD:   CHIEF COMPLAINT: Estrogen receptor positive breast cancer  CURRENT TREATMENT: Anastrozole   BREAST CANCER HISTORY: From the original intake note:  Aquila had minor trauma to the left breast but did not think much about it until while swimming on the medication she felt a hard mass in her left breast. She brought it to medical attention and on 05/04/2016 she underwent left diagnostic mammography with tomography at the [facility] Center. This showed the breast density to be category B. In the left breast upper inner quadrant there was a persistent area of asymmetry measuring 4.9 cm. On exam this was a firm but poorly defined palpable mass. Biopsy of this mass 05/04/2016 showed ([id number]) an invasive ductal carcinoma, grade 1, estrogen receptor 90% positive with moderate staining intensity, progesterone receptor 90% positive with strong staining intensity, with an MIB-1 of 10%, and no HER-2 amplification, the signals ratio being 1.19 and the number per cell 1.90.  On 05/09/2016 the patient had ultrasonography of the left axilla which was benign. On the same day she had a second biopsy of what is either a second mass or a distant area of the same mass (in the same quadrant) and this showed ([id number]) invasive ductal carcinoma, grade 2, estrogen receptor 100% positive,  progesterone receptor 90% positive, both with strong staining intensity, with an MIB-1 of 15%, and no HER-2 amplification, the signals ratio being 1.50 and the number per cell 3.15.  The patient's subsequent history is as detailed below.   INTERVAL HISTORY: Marri returns today for follow-up and treatment of her estrogen receptor positive breast cancer. She was last seen here on 07/31/2018.   She continues on anastrozole.  She tolerates this well, with no side effects, specifically no hot flashes and no significant vaginal dryness issues  [place]'s last bone density screening on 04/17/2017, showed a T-score of -1.8, which is considered osteopenic.    Since her last visit here, she underwent a digital diagnostic unilateral right mammogram with tomography on 01/22/2019 at [facility] showing: Breast Density Category B. Dystrophic calcifications in the right breast are benign.   REVIEW OF SYSTEMS: Dondrea is a history of repeated urinary tract infections and is not suppressive Keflex.  She has been staying in the house almost continuously since February, except for one brief trip to the beach where she stayed at the beach house and one time when she visited her daughter.  Her husband does the shopping.  She is not exercising regularly.  She tells me they have a good diet.  A detailed review of systems today was otherwise stable   PAST MEDICAL HISTORY: Past Medical History:  Diagnosis Date  . Anemia    hx of   . Anxiety   . Breast cancer in female [facility]) 03/2016   left  . Chronic bronchitis ([place])    FLARE UPS USUALLY ONCE A YEAR  . Chronic lower back pain   . Complication of anesthesia    trouble waking  up   . CRI (chronic renal insufficiency) 07/27/2016  . DDD (degenerative disc disease), lumbar   . Depression   . GERD (gastroesophageal reflux disease)   . History of recent fall    "twice on Sunday; once yesterday" (08/29/2016)  . Hypertension   . Inflammatory arthritis  07/27/2016   Sero Negative, Positive Synovitis hands, [place]  . Insomnia   . Irregular heart beats   . Macular degeneration of right eye   . New onset atrial fibrillation ([place])   . OA (osteoarthritis) of hip 06/24/2012  . OA (osteoarthritis) of knee 11/02/2013   L knee   . Obesity   . Osteoarthritis of both feet 07/27/2016  . Osteoarthritis of both hands 07/27/2016  . PONV (postoperative nausea and vomiting)    SEVERE N&V AND DIARRHEA AFTER HYSTERECTOMY AND AFTER WISDOM TEETH EXTRACTIONS - NO PROBLEMS WITH LAST 2 SURGERIES - THE HIP AND LEFT KNEE  . Postop Acute blood loss anemia 06/25/2012  . Postop Hyponatremia 06/25/2012  . Postop Sinus tachycardia 06/27/2012  . RBBB (right bundle branch block) 07/27/2016  . Rheumatoid arthritis [facility])    "doctor recently took me off all RX for this" (08/02/2016)  . Right bundle branch block   . Right bundle branch block   . S/p dental crown    dental crowns on every tooth  . Status post total bilateral knee replacement 11/13/2013  . Vitamin D deficiency     PAST SURGICAL HISTORY: Past Surgical History:  Procedure Laterality Date  . ABDOMINAL HYSTERECTOMY    . AXILLARY SURGERY Left 06/25/2016   Aspiration of left axillary seroma   . BREAST BIOPSY Left 03/2016  . BREAST LUMPECTOMY WITH RADIOACTIVE SEED AND SENTINEL LYMPH NODE BIOPSY Left 06/13/2016   Procedure: LEFT BREAST LUMPECTOMY WITH BRACKETED  RADIOACTIVE SEED AND SENTINEL LYMPH NODE BIOPSY;  Surgeon: Rolm Bookbinder, MD;  Location: [place];  Service: General;  Laterality: Left;  . BREAST RECONSTRUCTION Bilateral 06/25/2016   BILATERAL ONCOPLASTIC BREAST RECONSTRUCTION WITH BREAST REDUCTION  . BREAST RECONSTRUCTION WITH PLACEMENT OF TISSUE EXPANDER AND FLEX HD (ACELLULAR HYDRATED DERMIS) Bilateral 06/25/2016   Procedure: BILATERAL ONCOPLASTIC BREAST RECONSTRUCTION WITH BREAST REDUCTION, Aspiration of left axillary seroma;  Surgeon: Irene Limbo, MD;  Location: [facility];  Service: Plastics;   Laterality: Bilateral;  . BREAST REDUCTION SURGERY Bilateral 06/25/2016   Procedure: MAMMARY REDUCTION  (BREAST) BILATERAL;  Surgeon: Irene Limbo, MD;  Location: [place];  Service: Plastics;  Laterality: Bilateral;  . CESAREAN SECTION  1966; 1971; 1973  . COLONOSCOPY    . DEBRIDEMENT AND CLOSURE WOUND Right 07/11/2016   Procedure: DEBRIDEMENT LEFT BREAST;  Surgeon: Irene Limbo, MD;  Location: [place];  Service: Plastics;  Laterality: Right;  . JOINT REPLACEMENT    . KNEE ARTHROSCOPY Right    "before replacement"  . MASTECTOMY COMPLETE / SIMPLE Left 08/02/2016   total  . REDUCTION MAMMAPLASTY Bilateral 06/25/2016  . TOTAL HIP ARTHROPLASTY  06/24/2012   Procedure: TOTAL HIP ARTHROPLASTY;  Surgeon: [name], MD;  Location: [facility];  Service: Orthopedics;  Laterality: Left;  . TOTAL KNEE ARTHROPLASTY N/A 11/02/2013   Procedure: LEFT TOTAL KNEE ARTHROPLASTY WITH RIGHT KNEE CORTISONE INJECTION;  Surgeon: [name], MD;  Location: [facility];  Service: Orthopedics;  Laterality: N/A;  . TOTAL KNEE ARTHROPLASTY Right 05/31/2014   Procedure: RIGHT TOTAL KNEE ARTHROPLASTY;  Surgeon: [name], MD;  Location: [facility];  Service: Orthopedics;  Laterality: Right;  . TOTAL MASTECTOMY Left 08/02/2016   Procedure:  LEFT TOTAL MASTECTOMY;  Surgeon: Rolm Bookbinder, MD;  Location: [place];  Service: General;  Laterality: Left;  . TUBAL LIGATION  1973  . WISDOM TOOTH EXTRACTION  1970's   admitted to hospital for surgery    FAMILY HISTORY Family History  Problem Relation Age of Onset  . Diabetes Mother     The patient's mother died at age 8. The patient's father died at age 35 following a stroke. The patient had no brothers, 1 sister. There is no history of breast or ovarian cancer in the family.   GYNECOLOGIC HISTORY:  No LMP recorded. Patient has had a hysterectomy. Menarche age 75, first live birth age 42. The patient is GX P3. She underwent total abdominal hysterectomy, with bilateral  salpingo-oophorectomy, in 1993. She used estrogen replacement approximately 9 months. She never used oral contraceptives.  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 Her husband [name] used to r be in businesses but he also is retired. Son Aaron Edelman is a Programme researcher, broadcasting/film/video in [place]. Son Legrand Como is a Engineer, maintenance (IT) in [employer]. Daughter Jalaiya Oyster lives in [place]. 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 The patient has 5 grandchildren. 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 Smoking status: Never Smoker  . Smokeless tobacco: Never Used  Substance Use Topics  . Alcohol use: No  . Drug use: Yes    Types: Other-see comments    Comment: CBD oil     Colonoscopy: 2015/[facility]  PAP:  Bone density: [facility] that showed: T-score of -1.8.    Allergies  Allergen Reactions  . Morphine And Related Other (See Comments)    hallucinations    Current Outpatient Medications  Medication Sig Dispense Refill  . amLODipine (NORVASC) 10 MG tablet 5 mg.    . anastrozole (ARIMIDEX) 1 MG tablet Take 1 tablet (1 mg total) by mouth daily. 90 tablet 0  . cephALEXin (KEFLEX) 500 MG capsule Take 1 capsule (500 mg total) by mouth daily.    . cholecalciferol (VITAMIN D) 1000 units tablet Take 1,000 Units by mouth daily.    . clonazePAM (KLONOPIN) 1 MG tablet Take 2 mg by mouth 2 (two) times daily.     . cyclobenzaprine (FLEXERIL) 10 MG tablet     . HYDROcodone-acetaminophen (NORCO) 10-325 MG tablet     . MAGNESIUM PO Take 2 tablets by mouth daily.    . metoprolol tartrate (LOPRESSOR) 25 MG tablet Take 25 mg by mouth 2 (two) times daily.     . Misc Natural Products (TART CHERRY ADVANCED PO) Take 1 capsule by mouth daily.    . Omega-3 Fatty Acids (FISH OIL) 1000 MG CAPS Take 1,000 mg by mouth daily.    .  Saccharomyces boulardii (PROBIOTIC) 250 MG CAPS Take by mouth. 60 capsule   . triamterene-hydrochlorothiazide (MAXZIDE-25) 37.5-25 MG tablet     . Turmeric 500 MG CAPS Take 500 mg by mouth daily.     . valsartan (DIOVAN) 80 MG tablet Take 80 mg by mouth daily.    Marland Kitchen venlafaxine XR (EFFEXOR-XR) 75 MG 24 hr capsule Take 1 capsule by mouth twice daily 180 capsule 0  . vitamin C (ASCORBIC ACID) 500 MG tablet Take 1,000 mg by mouth daily.    Marland Kitchen zolpidem (AMBIEN) 5 MG tablet Take 5  mg by mouth at bedtime as needed.     No current facility-administered medications for this visit.    Facility-Administered Medications Ordered in Other Visits  Medication Dose Route Frequency Provider Last Rate Last Dose  . methocarbamol (ROBAXIN) tablet 500 mg  500 mg Oral Q6H PRN Rolm Bookbinder, MD      . metroNIDAZOLE (FLAGYL) tablet 500 mg  500 mg Oral Q8H Rolm Bookbinder, MD      . ondansetron (ZOFRAN-ODT) disintegrating tablet 4 mg  4 mg Oral Q6H PRN Rolm Bookbinder, MD       Or  . ondansetron (ZOFRAN) 4 mg in sodium chloride 0.9 % 50 mL IVPB  4 mg Intravenous Q6H PRN Rolm Bookbinder, MD      . oxyCODONE (Oxy IR/ROXICODONE) immediate release tablet 5-10 mg  5-10 mg Oral Q4H PRN Rolm Bookbinder, MD      . simethicone [facility]) chewable tablet 40 mg  40 mg Oral Q6H PRN Rolm Bookbinder, MD        OBJECTIVE:Morbidly obese white woman using a chair walker  Vitals:   06/09/19 0918  BP: (!) 161/81  Pulse: 74  Resp: 18  Temp: 98.6 F (37 C)  SpO2: 97%   Wt Readings from Last 3 Encounters:  06/09/19 (!) 304 lb 1.6 oz (137.9 kg)  07/31/18 284 lb 8 oz (129 kg)  02/05/18 286 lb 11.2 oz (130 kg)   Body mass index is 55.62 kg/m.    ECOG FS:1 - Symptomatic but completely ambulatory  Ocular: Sclerae unicteric, pupils round and equal Ear-nose-throat: Wearing a mask Lymphatic: No cervical or supraclavicular adenopathy Lungs no rales or rhonchi Heart regular rate and rhythm Abd soft,  nontender, positive bowel sounds MSK no focal spinal tenderness, no joint edema Neuro: non-focal, well-oriented, appropriate affect Breasts: The right breast is status post remote reduction mammoplasty.  There is no evidence of activity.  The left breast is status post mastectomy.  The incision has healed well and there is no evidence of disease activity.  Both axillae are benign.   LAB RESULTS:  CMP     Component Value Date/Time   NA 141 06/09/2019 0850   NA 140 07/04/2017 1112   K 4.2 06/09/2019 0850   K 4.0 07/04/2017 1112   CL 102 06/09/2019 0850   CO2 30 06/09/2019 0850   CO2 28 07/04/2017 1112   GLUCOSE 107 (H) 06/09/2019 0850   GLUCOSE 106 07/04/2017 1112   BUN 22 06/09/2019 0850   BUN 26.7 (H) 07/04/2017 1112   CREATININE 1.05 (H) 06/09/2019 0850   CREATININE 1.14 (H) 02/05/2018 1012   CREATININE 1.1 07/04/2017 1112   CALCIUM 9.8 06/09/2019 0850   CALCIUM 9.8 07/04/2017 1112   PROT 6.8 06/09/2019 0850   PROT 6.9 07/04/2017 1112   ALBUMIN 4.0 06/09/2019 0850   ALBUMIN 3.8 07/04/2017 1112   AST 40 06/09/2019 0850   AST 26 02/05/2018 1012   AST 21 07/04/2017 1112   ALT 57 (H) 06/09/2019 0850   ALT 37 02/05/2018 1012   ALT 29 07/04/2017 1112   ALKPHOS 111 06/09/2019 0850   ALKPHOS 87 07/04/2017 1112   BILITOT 0.3 06/09/2019 0850   BILITOT 0.3 02/05/2018 1012   BILITOT 0.37 07/04/2017 1112   GFRNONAA 51 (L) 06/09/2019 0850   GFRNONAA 46 (L) 02/05/2018 1012   GFRAA 59 (L) 06/09/2019 0850   GFRAA 53 (L) 02/05/2018 1012    INo results found for: SPEP, UPEP  Lab Results  Component Value Date  WBC 6.7 06/09/2019   NEUTROABS 3.6 06/09/2019   HGB 13.5 06/09/2019   HCT 43.0 06/09/2019   MCV 91.7 06/09/2019   PLT 235 06/09/2019      Chemistry      Component Value Date/Time   NA 141 06/09/2019 0850   NA 140 07/04/2017 1112   K 4.2 06/09/2019 0850   K 4.0 07/04/2017 1112   CL 102 06/09/2019 0850   CO2 30 06/09/2019 0850   CO2 28 07/04/2017 1112   BUN 22  06/09/2019 0850   BUN 26.7 (H) 07/04/2017 1112   CREATININE 1.05 (H) 06/09/2019 0850   CREATININE 1.14 (H) 02/05/2018 1012   CREATININE 1.1 07/04/2017 1112   GLU 95 01/20/2016      Component Value Date/Time   CALCIUM 9.8 06/09/2019 0850   CALCIUM 9.8 07/04/2017 1112   ALKPHOS 111 06/09/2019 0850   ALKPHOS 87 07/04/2017 1112   AST 40 06/09/2019 0850   AST 26 02/05/2018 1012   AST 21 07/04/2017 1112   ALT 57 (H) 06/09/2019 0850   ALT 37 02/05/2018 1012   ALT 29 07/04/2017 1112   BILITOT 0.3 06/09/2019 0850   BILITOT 0.3 02/05/2018 1012   BILITOT 0.37 07/04/2017 1112       No results found for: LABCA2  No components found for: LABCA125  No results for input(s): INR in the last 168 hours.  Urinalysis    Component Value Date/Time   COLORURINE YELLOW 08/29/2016 2120   APPEARANCEUR HAZY (A) 08/29/2016 2120   LABSPEC 1.014 08/29/2016 2120   PHURINE 5.0 08/29/2016 2120   GLUCOSEU NEGATIVE 08/29/2016 2120   HGBUR NEGATIVE 08/29/2016 2120   BILIRUBINUR NEGATIVE 08/29/2016 2120   KETONESUR NEGATIVE 08/29/2016 2120   PROTEINUR NEGATIVE 08/29/2016 2120   UROBILINOGEN 0.2 05/26/2014 1054   NITRITE NEGATIVE 08/29/2016 2120   LEUKOCYTESUR LARGE (A) 08/29/2016 2120     STUDIES: No results found.   ELIGIBLE FOR AVAILABLE RESEARCH PROTOCOL: no  ASSESSMENT: 78 y.o. [place] woman status post left breast upper inner quadrant biopsy 05/04/2016 for a clinical T2 N0 invasive ductal carcinoma, grade 1 estrogen and progesterone receptor positive, HER-2 nonamplified, with an MIB-1 of 10%.  (1) biopsy of a second area 05/09/2016 also in the upper inner quadrant of the left breast showed invasive ductal carcinoma, grade 2, estrogen and progesterone receptor positive, HER-2 nonamplified, with an MIB-1 of 15%.  (2) status post left lumpectomy and sentinel lymph node sampling 06/13/2016 for an mpT1c pN0(i+), stage IA invasive ductal carcinoma, grade 1, with close but negative margins   (a) status post bilateral reduction mammoplasty with left oncoplastic surgery 06/25/2016  (b) status post debridement of left nipple/areolar necrosis 07/11/2016  (c) status post left simple mastectomy 08/02/2016 showing inflammation and abscess but no evidence of malignancy  (3) Oncotype score of 18 predicts a 10 year risk of recurrence outside the breast of 11% if the patient's only systemic therapy is tamoxifen for 5 years. It also predicts no significant benefit from chemotherapy  (4) postmastectomy radiation not indicated  (5) started anastrozole September 2017  (a) DEXA scan 04/17/2017 showed a T score of -1.8.  PLAN: Kieren is now 3 years out from definitive surgery for her breast cancer with no evidence of disease activity.  This is very favorable.  She is tolerating anastrozole well and the plan will be to continue that for total of 5 years.  She will have her next mammogram in May 2021.  She will have a bone density at  the same time.  She was encouraged to continue to try to lose weight.  She is keeping appropriate pandemic precautions.  She will return to see me in 1 year.  She knows to call for any other issues that may develop before then.  , Virgie Dad, MD  06/09/19 9:54 AM Medical Oncology and Hematology [facility] [place], [place] [address] Tel. [phone]    Fax. [phone]  I, Jacqualyn Posey am acting as a Education administrator for Chauncey Cruel, MD.   I, Lurline Del MD, have reviewed the above documentation for accuracy and completeness, and I agree with the above.

## 2019-06-09 ENCOUNTER — Inpatient Hospital Stay: Payer: Medicare Other | Attending: Oncology | Admitting: Oncology

## 2019-06-09 ENCOUNTER — Other Ambulatory Visit: Payer: Self-pay

## 2019-06-09 ENCOUNTER — Inpatient Hospital Stay: Payer: Medicare Other

## 2019-06-09 ENCOUNTER — Telehealth: Payer: Self-pay | Admitting: Oncology

## 2019-06-09 VITALS — BP 161/81 | HR 74 | Temp 98.6°F | Resp 18 | Wt 304.1 lb

## 2019-06-09 DIAGNOSIS — Z79899 Other long term (current) drug therapy: Secondary | ICD-10-CM | POA: Diagnosis not present

## 2019-06-09 DIAGNOSIS — M8580 Other specified disorders of bone density and structure, unspecified site: Secondary | ICD-10-CM | POA: Insufficient documentation

## 2019-06-09 DIAGNOSIS — K219 Gastro-esophageal reflux disease without esophagitis: Secondary | ICD-10-CM | POA: Insufficient documentation

## 2019-06-09 DIAGNOSIS — Z6841 Body Mass Index (BMI) 40.0 and over, adult: Secondary | ICD-10-CM | POA: Diagnosis not present

## 2019-06-09 DIAGNOSIS — I10 Essential (primary) hypertension: Secondary | ICD-10-CM | POA: Diagnosis not present

## 2019-06-09 DIAGNOSIS — M5136 Other intervertebral disc degeneration, lumbar region: Secondary | ICD-10-CM | POA: Diagnosis not present

## 2019-06-09 DIAGNOSIS — M1990 Unspecified osteoarthritis, unspecified site: Secondary | ICD-10-CM | POA: Insufficient documentation

## 2019-06-09 DIAGNOSIS — C50212 Malignant neoplasm of upper-inner quadrant of left female breast: Secondary | ICD-10-CM | POA: Diagnosis not present

## 2019-06-09 DIAGNOSIS — I4891 Unspecified atrial fibrillation: Secondary | ICD-10-CM | POA: Insufficient documentation

## 2019-06-09 DIAGNOSIS — I4510 Unspecified right bundle-branch block: Secondary | ICD-10-CM | POA: Diagnosis not present

## 2019-06-09 DIAGNOSIS — Z170 Estrogen receptor positive status [ER+]: Secondary | ICD-10-CM | POA: Diagnosis not present

## 2019-06-09 DIAGNOSIS — Z79811 Long term (current) use of aromatase inhibitors: Secondary | ICD-10-CM | POA: Insufficient documentation

## 2019-06-09 DIAGNOSIS — F419 Anxiety disorder, unspecified: Secondary | ICD-10-CM | POA: Diagnosis not present

## 2019-06-09 DIAGNOSIS — G4700 Insomnia, unspecified: Secondary | ICD-10-CM | POA: Diagnosis not present

## 2019-06-09 DIAGNOSIS — E669 Obesity, unspecified: Secondary | ICD-10-CM | POA: Diagnosis not present

## 2019-06-09 DIAGNOSIS — M069 Rheumatoid arthritis, unspecified: Secondary | ICD-10-CM | POA: Diagnosis not present

## 2019-06-09 LAB — CBC WITH DIFFERENTIAL/PLATELET
Abs Immature Granulocytes: 0.04 10*3/uL (ref 0.00–0.07)
Basophils Absolute: 0.1 10*3/uL (ref 0.0–0.1)
Basophils Relative: 1 %
Eosinophils Absolute: 0.3 10*3/uL (ref 0.0–0.5)
Eosinophils Relative: 4 %
HCT: 43 % (ref 36.0–46.0)
Hemoglobin: 13.5 g/dL (ref 12.0–15.0)
Immature Granulocytes: 1 %
Lymphocytes Relative: 30 %
Lymphs Abs: 2 10*3/uL (ref 0.7–4.0)
MCH: 28.8 pg (ref 26.0–34.0)
MCHC: 31.4 g/dL (ref 30.0–36.0)
MCV: 91.7 fL (ref 80.0–100.0)
Monocytes Absolute: 0.8 10*3/uL (ref 0.1–1.0)
Monocytes Relative: 12 %
Neutro Abs: 3.6 10*3/uL (ref 1.7–7.7)
Neutrophils Relative %: 52 %
Platelets: 235 10*3/uL (ref 150–400)
RBC: 4.69 MIL/uL (ref 3.87–5.11)
RDW: 13.8 % (ref 11.5–15.5)
WBC: 6.7 10*3/uL (ref 4.0–10.5)
nRBC: 0 % (ref 0.0–0.2)

## 2019-06-09 LAB — COMPREHENSIVE METABOLIC PANEL
ALT: 57 U/L — ABNORMAL HIGH (ref 0–44)
AST: 40 U/L (ref 15–41)
Albumin: 4 g/dL (ref 3.5–5.0)
Alkaline Phosphatase: 111 U/L (ref 38–126)
Anion gap: 9 (ref 5–15)
BUN: 22 mg/dL (ref 8–23)
CO2: 30 mmol/L (ref 22–32)
Calcium: 9.8 mg/dL (ref 8.9–10.3)
Chloride: 102 mmol/L (ref 98–111)
Creatinine, Ser: 1.05 mg/dL — ABNORMAL HIGH (ref 0.44–1.00)
GFR calc Af Amer: 59 mL/min — ABNORMAL LOW (ref >60)
GFR calc non Af Amer: 51 mL/min — ABNORMAL LOW (ref >60)
Glucose, Bld: 107 mg/dL — ABNORMAL HIGH (ref 70–99)
Potassium: 4.2 mmol/L (ref 3.5–5.1)
Sodium: 141 mmol/L (ref 135–145)
Total Bilirubin: 0.3 mg/dL (ref 0.3–1.2)
Total Protein: 6.8 g/dL (ref 6.5–8.1)

## 2019-06-09 MED ORDER — ANASTROZOLE 1 MG PO TABS: 1.0000 mg | ORAL_TABLET | Freq: Every day | ORAL | 0 refills | Status: DC

## 2019-06-09 NOTE — Telephone Encounter (Signed)
I left a message regarding schedule  

## 2019-06-30 DIAGNOSIS — G894 Chronic pain syndrome: Secondary | ICD-10-CM | POA: Diagnosis not present

## 2019-06-30 DIAGNOSIS — M545 Low back pain: Secondary | ICD-10-CM | POA: Diagnosis not present

## 2019-06-30 DIAGNOSIS — Z5181 Encounter for therapeutic drug level monitoring: Secondary | ICD-10-CM | POA: Diagnosis not present

## 2019-06-30 DIAGNOSIS — Z79899 Other long term (current) drug therapy: Secondary | ICD-10-CM | POA: Diagnosis not present

## 2019-07-02 ENCOUNTER — Other Ambulatory Visit: Payer: Self-pay | Admitting: Oncology

## 2019-07-02 DIAGNOSIS — Z1231 Encounter for screening mammogram for malignant neoplasm of breast: Secondary | ICD-10-CM

## 2019-07-28 DIAGNOSIS — H43822 Vitreomacular adhesion, left eye: Secondary | ICD-10-CM | POA: Diagnosis not present

## 2019-07-28 DIAGNOSIS — H35722 Serous detachment of retinal pigment epithelium, left eye: Secondary | ICD-10-CM | POA: Diagnosis not present

## 2019-07-28 DIAGNOSIS — H3554 Dystrophies primarily involving the retinal pigment epithelium: Secondary | ICD-10-CM | POA: Diagnosis not present

## 2019-07-28 DIAGNOSIS — H353212 Exudative age-related macular degeneration, right eye, with inactive choroidal neovascularization: Secondary | ICD-10-CM | POA: Diagnosis not present

## 2019-08-12 DIAGNOSIS — N3941 Urge incontinence: Secondary | ICD-10-CM | POA: Diagnosis not present

## 2019-08-12 DIAGNOSIS — N3020 Other chronic cystitis without hematuria: Secondary | ICD-10-CM | POA: Diagnosis not present

## 2019-08-26 DIAGNOSIS — H43822 Vitreomacular adhesion, left eye: Secondary | ICD-10-CM | POA: Diagnosis not present

## 2019-08-26 DIAGNOSIS — H33012 Retinal detachment with single break, left eye: Secondary | ICD-10-CM | POA: Diagnosis not present

## 2019-09-01 DIAGNOSIS — H43822 Vitreomacular adhesion, left eye: Secondary | ICD-10-CM | POA: Diagnosis not present

## 2019-09-01 DIAGNOSIS — H33012 Retinal detachment with single break, left eye: Secondary | ICD-10-CM | POA: Diagnosis not present

## 2019-09-01 DIAGNOSIS — Z09 Encounter for follow-up examination after completed treatment for conditions other than malignant neoplasm: Secondary | ICD-10-CM | POA: Diagnosis not present

## 2019-09-01 DIAGNOSIS — H353212 Exudative age-related macular degeneration, right eye, with inactive choroidal neovascularization: Secondary | ICD-10-CM | POA: Diagnosis not present

## 2019-10-01 DIAGNOSIS — B373 Candidiasis of vulva and vagina: Secondary | ICD-10-CM | POA: Diagnosis not present

## 2019-10-01 DIAGNOSIS — N3020 Other chronic cystitis without hematuria: Secondary | ICD-10-CM | POA: Diagnosis not present

## 2019-10-13 DIAGNOSIS — H353123 Nonexudative age-related macular degeneration, left eye, advanced atrophic without subfoveal involvement: Secondary | ICD-10-CM | POA: Diagnosis not present

## 2019-10-13 DIAGNOSIS — H33012 Retinal detachment with single break, left eye: Secondary | ICD-10-CM | POA: Diagnosis not present

## 2019-10-13 DIAGNOSIS — Z09 Encounter for follow-up examination after completed treatment for conditions other than malignant neoplasm: Secondary | ICD-10-CM | POA: Diagnosis not present

## 2019-10-13 DIAGNOSIS — H353212 Exudative age-related macular degeneration, right eye, with inactive choroidal neovascularization: Secondary | ICD-10-CM | POA: Diagnosis not present

## 2019-10-13 DIAGNOSIS — H43822 Vitreomacular adhesion, left eye: Secondary | ICD-10-CM | POA: Diagnosis not present

## 2019-10-27 DIAGNOSIS — G894 Chronic pain syndrome: Secondary | ICD-10-CM | POA: Diagnosis not present

## 2019-10-27 NOTE — Progress Notes (Deleted)
Office Visit Note  Patient: Debra Griffith             Date of Birth: 1941-07-02           MRN: [id number]             PCP: Josetta Huddle, MD Referring: Josetta Huddle, MD Visit Date: 10/29/2019 Occupation: @GUAROCC @  Subjective:  No chief complaint on file.   History of Present Illness: Debra Griffith is a 78 y.o. female ***   Activities of Daily Living:  Patient reports morning stiffness for *** {minute/hour:19697}.   Patient {ACTIONS;DENIES/REPORTS:21021675::"Denies"} nocturnal pain.  Difficulty dressing/grooming: {ACTIONS;DENIES/REPORTS:21021675::"Denies"} Difficulty climbing stairs: {ACTIONS;DENIES/REPORTS:21021675::"Denies"} Difficulty getting out of chair: {ACTIONS;DENIES/REPORTS:21021675::"Denies"} Difficulty using hands for taps, buttons, cutlery, and/or writing: {ACTIONS;DENIES/REPORTS:21021675::"Denies"}  No Rheumatology ROS completed.   PMFS History:  Patient Active Problem List   Diagnosis Date Noted  . Morbid obesity with BMI of 50.0-59.9, adult ([place]) 06/09/2019  . Primary osteoarthritis of both knees 10/25/2016  . New onset atrial fibrillation ([place])   . Cellulitis 08/30/2016  . Cellulitis of left breast 08/29/2016  . Dehydration 08/29/2016  . Hypotension 08/29/2016  . Confusion 08/29/2016  . RBBB (right bundle branch block) 07/27/2016  . CRI (chronic renal insufficiency) 07/27/2016  . DDD (degenerative disc disease), lumbar 07/27/2016  . Primary osteoarthritis of both feet 07/27/2016  . Primary osteoarthritis of both hands 07/27/2016  . Inflammatory arthritis 07/27/2016  . Malignant neoplasm of upper-inner quadrant of left breast in female, estrogen receptor positive ([place]) 05/31/2016  . Depression 06/03/2014  . Constipation 11/19/2013  . Insomnia 11/19/2013  . Anxiety 11/19/2013  . Status post total bilateral knee replacement 11/13/2013  . Essential hypertension   . GERD (gastroesophageal reflux disease)   . OA (osteoarthritis) of knee 11/02/2013    . Postop Sinus tachycardia 06/27/2012  . Postop Hyponatremia 06/25/2012  . Postop Acute blood loss anemia 06/25/2012  . OA (osteoarthritis) of hip 06/24/2012    Past Medical History:  Diagnosis Date  . Anemia    hx of   . Anxiety   . Breast cancer in female [facility]) 03/2016   left  . Chronic bronchitis ([place])    FLARE UPS USUALLY ONCE A YEAR  . Chronic lower back pain   . Complication of anesthesia    trouble waking up   . CRI (chronic renal insufficiency) 07/27/2016  . DDD (degenerative disc disease), lumbar   . Depression   . GERD (gastroesophageal reflux disease)   . History of recent fall    "twice on Sunday; once yesterday" (08/29/2016)  . Hypertension   . Inflammatory arthritis 07/27/2016   Sero Negative, Positive Synovitis hands, WJ  . Insomnia   . Irregular heart beats   . Macular degeneration of right eye   . New onset atrial fibrillation ([place])   . OA (osteoarthritis) of hip 06/24/2012  . OA (osteoarthritis) of knee 11/02/2013   L knee   . Obesity   . Osteoarthritis of both feet 07/27/2016  . Osteoarthritis of both hands 07/27/2016  . PONV (postoperative nausea and vomiting)    SEVERE N&V AND DIARRHEA AFTER HYSTERECTOMY AND AFTER WISDOM TEETH EXTRACTIONS - NO PROBLEMS WITH LAST 2 SURGERIES - THE HIP AND LEFT KNEE  . Postop Acute blood loss anemia 06/25/2012  . Postop Hyponatremia 06/25/2012  . Postop Sinus tachycardia 06/27/2012  . RBBB (right bundle branch block) 07/27/2016  . Rheumatoid arthritis [facility])    "doctor recently took me off all RX for this" (08/02/2016)  . Right  bundle branch block   . Right bundle branch block   . S/p dental crown    dental crowns on every tooth  . Status post total bilateral knee replacement 11/13/2013  . Vitamin D deficiency     Family History  Problem Relation Age of Onset  . Diabetes Mother    Past Surgical History:  Procedure Laterality Date  . ABDOMINAL HYSTERECTOMY    . AXILLARY SURGERY Left 06/25/2016   Aspiration of  left axillary seroma   . BREAST BIOPSY Left 03/2016  . BREAST LUMPECTOMY WITH RADIOACTIVE SEED AND SENTINEL LYMPH NODE BIOPSY Left 06/13/2016   Procedure: LEFT BREAST LUMPECTOMY WITH BRACKETED  RADIOACTIVE SEED AND SENTINEL LYMPH NODE BIOPSY;  Surgeon: Rolm Bookbinder, MD;  Location: [place];  Service: General;  Laterality: Left;  . BREAST RECONSTRUCTION Bilateral 06/25/2016   BILATERAL ONCOPLASTIC BREAST RECONSTRUCTION WITH BREAST REDUCTION  . BREAST RECONSTRUCTION WITH PLACEMENT OF TISSUE EXPANDER AND FLEX HD (ACELLULAR HYDRATED DERMIS) Bilateral 06/25/2016   Procedure: BILATERAL ONCOPLASTIC BREAST RECONSTRUCTION WITH BREAST REDUCTION, Aspiration of left axillary seroma;  Surgeon: Irene Limbo, MD;  Location: [place];  Service: Plastics;  Laterality: Bilateral;  . BREAST REDUCTION SURGERY Bilateral 06/25/2016   Procedure: MAMMARY REDUCTION  (BREAST) BILATERAL;  Surgeon: Irene Limbo, MD;  Location: [place];  Service: Plastics;  Laterality: Bilateral;  . CESAREAN SECTION  1966; 1971; 1973  . COLONOSCOPY    . DEBRIDEMENT AND CLOSURE WOUND Right 07/11/2016   Procedure: DEBRIDEMENT LEFT BREAST;  Surgeon: Irene Limbo, MD;  Location: [place];  Service: Plastics;  Laterality: Right;  . JOINT REPLACEMENT    . KNEE ARTHROSCOPY Right    "before replacement"  . MASTECTOMY COMPLETE / SIMPLE Left 08/02/2016   total  . REDUCTION MAMMAPLASTY Bilateral 06/25/2016  . TOTAL HIP ARTHROPLASTY  06/24/2012   Procedure: TOTAL HIP ARTHROPLASTY;  Surgeon: [name], MD;  [facility];  Service: Orthopedics;  Laterality: Left;  . TOTAL KNEE ARTHROPLASTY N/A 11/02/2013   Procedure: LEFT TOTAL KNEE ARTHROPLASTY WITH RIGHT KNEE CORTISONE INJECTION;  Surgeon: [name], MD;  [facility];  Service: Orthopedics;  Laterality: N/A;  . TOTAL KNEE ARTHROPLASTY Right 05/31/2014   Procedure: RIGHT TOTAL KNEE ARTHROPLASTY;  Surgeon: [name], MD;  [facility];  Service: Orthopedics;   Laterality: Right;  . TOTAL MASTECTOMY Left 08/02/2016   Procedure: LEFT TOTAL MASTECTOMY;  Surgeon: Rolm Bookbinder, MD;  Location: [place];  Service: General;  Laterality: Left;  . TUBAL LIGATION  1973  . WISDOM TOOTH EXTRACTION  1970's   admitted to hospital for surgery   Social History   Social History Narrative  . Not on file   Immunization History  Administered Date(s) Administered  . Influenza,inj,Quad PF,6+ Mos 07/20/2016  . Influenza-Unspecified 05/18/2014, 07/18/2018  . PPD Test 11/01/2013     Objective: Vital Signs: There were no vitals taken for this visit.   Physical Exam   Musculoskeletal Exam: ***  CDAI Exam: CDAI Score: -- Patient Global: --; Provider Global: -- Swollen: --; Tender: -- Joint Exam 10/29/2019   No joint exam has been documented for this visit   There is currently no information documented on the homunculus. Go to the Rheumatology activity and complete the homunculus joint exam.  Investigation: No additional findings.  Imaging: No results found.  Recent Labs: Lab Results  Component Value Date   WBC 6.7 06/09/2019   HGB 13.5 06/09/2019   PLT 235 06/09/2019   NA 141 06/09/2019  K 4.2 06/09/2019   CL 102 06/09/2019   CO2 30 06/09/2019   GLUCOSE 107 (H) 06/09/2019   BUN 22 06/09/2019   CREATININE 1.05 (H) 06/09/2019   BILITOT 0.3 06/09/2019   ALKPHOS 111 06/09/2019   AST 40 06/09/2019   ALT 57 (H) 06/09/2019   PROT 6.8 06/09/2019   ALBUMIN 4.0 06/09/2019   CALCIUM 9.8 06/09/2019   GFRAA 59 (L) 06/09/2019    Speciality Comments: PLQ Eye Exam WNL 02/19/17 @ [facility]  Procedures:  No procedures performed Allergies: Morphine and related   Assessment / Plan:     Visit Diagnoses: No diagnosis found.  Orders: No orders of the defined types were placed in this encounter.  No orders of the defined types were placed in this encounter.   Face-to-face time spent with patient was *** minutes. Greater  than 50% of time was spent in counseling and coordination of care.  Follow-Up Instructions: No follow-ups on file.   Ofilia Neas, PA-C  Note - This record has been created using Dragon software.  Chart creation errors have been sought, but may not always  have been located. Such creation errors do not reflect on  the standard of medical care.

## 2019-10-29 ENCOUNTER — Ambulatory Visit: Payer: Medicare Other | Admitting: Rheumatology

## 2019-11-06 DIAGNOSIS — N3941 Urge incontinence: Secondary | ICD-10-CM | POA: Diagnosis not present

## 2019-11-06 DIAGNOSIS — N3020 Other chronic cystitis without hematuria: Secondary | ICD-10-CM | POA: Diagnosis not present

## 2019-12-01 ENCOUNTER — Other Ambulatory Visit: Payer: Self-pay | Admitting: Oncology

## 2019-12-02 DIAGNOSIS — M158 Other polyosteoarthritis: Secondary | ICD-10-CM | POA: Diagnosis not present

## 2019-12-02 DIAGNOSIS — M1990 Unspecified osteoarthritis, unspecified site: Secondary | ICD-10-CM | POA: Diagnosis not present

## 2019-12-02 DIAGNOSIS — F322 Major depressive disorder, single episode, severe without psychotic features: Secondary | ICD-10-CM | POA: Diagnosis not present

## 2019-12-02 DIAGNOSIS — Z853 Personal history of malignant neoplasm of breast: Secondary | ICD-10-CM | POA: Diagnosis not present

## 2019-12-02 DIAGNOSIS — I10 Essential (primary) hypertension: Secondary | ICD-10-CM | POA: Diagnosis not present

## 2019-12-03 ENCOUNTER — Other Ambulatory Visit: Payer: Self-pay

## 2019-12-03 MED ORDER — ANASTROZOLE 1 MG PO TABS: 1.0000 mg | ORAL_TABLET | Freq: Every day | ORAL | 0 refills | Status: DC

## 2019-12-10 DIAGNOSIS — H3554 Dystrophies primarily involving the retinal pigment epithelium: Secondary | ICD-10-CM | POA: Diagnosis not present

## 2019-12-10 DIAGNOSIS — H353212 Exudative age-related macular degeneration, right eye, with inactive choroidal neovascularization: Secondary | ICD-10-CM | POA: Diagnosis not present

## 2019-12-10 DIAGNOSIS — H43822 Vitreomacular adhesion, left eye: Secondary | ICD-10-CM | POA: Diagnosis not present

## 2019-12-10 DIAGNOSIS — H35722 Serous detachment of retinal pigment epithelium, left eye: Secondary | ICD-10-CM | POA: Diagnosis not present

## 2020-01-19 DIAGNOSIS — N3020 Other chronic cystitis without hematuria: Secondary | ICD-10-CM | POA: Diagnosis not present

## 2020-01-19 DIAGNOSIS — N3941 Urge incontinence: Secondary | ICD-10-CM | POA: Diagnosis not present

## 2020-01-19 DIAGNOSIS — R8271 Bacteriuria: Secondary | ICD-10-CM | POA: Diagnosis not present

## 2020-01-20 ENCOUNTER — Other Ambulatory Visit: Payer: Self-pay

## 2020-01-25 ENCOUNTER — Other Ambulatory Visit: Payer: Self-pay

## 2020-01-25 ENCOUNTER — Encounter: Payer: Self-pay | Admitting: Obstetrics & Gynecology

## 2020-01-25 ENCOUNTER — Ambulatory Visit (INDEPENDENT_AMBULATORY_CARE_PROVIDER_SITE_OTHER): Payer: Medicare Other | Admitting: Obstetrics & Gynecology

## 2020-01-25 VITALS — Temp 97.9°F | Ht 59.5 in | Wt 309.0 lb

## 2020-01-25 DIAGNOSIS — R32 Unspecified urinary incontinence: Secondary | ICD-10-CM

## 2020-01-25 DIAGNOSIS — R82998 Other abnormal findings in urine: Secondary | ICD-10-CM | POA: Diagnosis not present

## 2020-01-25 DIAGNOSIS — L292 Pruritus vulvae: Secondary | ICD-10-CM | POA: Diagnosis not present

## 2020-01-25 LAB — POCT URINALYSIS DIPSTICK
Bilirubin, UA: NEGATIVE
Blood, UA: NEGATIVE
Glucose, UA: NEGATIVE
Ketones, UA: NEGATIVE
Nitrite, UA: NEGATIVE
Protein, UA: NEGATIVE
Urobilinogen, UA: NEGATIVE E.U./dL — AB
pH, UA: 5 (ref 5.0–8.0)

## 2020-01-25 MED ORDER — VENLAFAXINE HCL ER 75 MG PO CP24: 75.0000 mg | ORAL_CAPSULE | Freq: Every day | ORAL | 0 refills | Status: DC

## 2020-01-25 MED ORDER — NITROFURANTOIN MACROCRYSTAL 100 MG PO CAPS: 100.0000 mg | ORAL_CAPSULE | Freq: Every day | ORAL | Status: AC

## 2020-01-25 NOTE — Progress Notes (Signed)
79 y.o. G3P0 Married White or Caucasian female here for vaginal itching with some discharge for the last few years.  She has been seeing Dr. Matilde Sprang due to urinary incontinence and urinary leakage.  She has recently been on antibiotics as well.  She is smelling some sweet smelling discharge at times and at other times there is stronger discharge.  Denies vaginal bleeding.  Has not been on HRT for many year.     Pt also had area beneath prior mastectomy that she would like me to check.  Has hx IA invasive ductal breast carcinoma s/p left reduction mammoplasty with left oncoplastic surgery [id number] complicated by left nipple/areolar necrosis 10/17 and then left simple mastectomy 11/17 with no evidence of malignancy in final specimen.  She is on tamoxifen.    No LMP recorded. Patient has had a hysterectomy.          Sexually active: No.  The current method of family planning is status post hysterectomy.    Exercising: No.  exercise Smoker:  no  Health Maintenance: Pap:  unsure History of abnormal Pap:  no MMG:  01-22-2019 rt breast category b density birads 2;neg, scheduled for tomorrow Colonoscopy: maybe 53yrs ago BMD:   2018 TDaP:  unsure Pneumonia vaccine(s): had done Shingrix:   Not done Hep C testing: had done per patient Screening Labs: poct wbc1+   reports that she has never smoked. She has never used smokeless tobacco. She reports current drug use. Drug: Other-see comments. She reports that she does not drink alcohol.  Past Medical History:  Diagnosis Date  . Anemia    hx of   . Anxiety   . Breast cancer in female [facility]) 03/2016   left  . Chronic bronchitis ([place])    FLARE UPS USUALLY ONCE A YEAR  . Chronic lower back pain   . Complication of anesthesia    trouble waking up   . CRI (chronic renal insufficiency) 07/27/2016  . DDD (degenerative disc disease), lumbar   . Depression   . GERD (gastroesophageal reflux disease)   . History of recent fall    "twice on Sunday; once  yesterday" (08/29/2016)  . Hypertension   . Inflammatory arthritis 07/27/2016   Sero Negative, Positive Synovitis hands, WJ  . Insomnia   . Irregular heart beats   . Macular degeneration of right eye   . New onset atrial fibrillation ([place])   . OA (osteoarthritis) of hip 06/24/2012  . OA (osteoarthritis) of knee 11/02/2013   L knee   . Obesity   . Osteoarthritis of both feet 07/27/2016  . Osteoarthritis of both hands 07/27/2016  . PONV (postoperative nausea and vomiting)    SEVERE N&V AND DIARRHEA AFTER HYSTERECTOMY AND AFTER WISDOM TEETH EXTRACTIONS - NO PROBLEMS WITH LAST 2 SURGERIES - THE HIP AND LEFT KNEE  . Postop Acute blood loss anemia 06/25/2012  . Postop Hyponatremia 06/25/2012  . Postop Sinus tachycardia 06/27/2012  . RBBB (right bundle branch block) 07/27/2016  . Rheumatoid arthritis [facility])    "doctor recently took me off all RX for this" (08/02/2016)  . Right bundle branch block   . Right bundle branch block   . S/p dental crown    dental crowns on every tooth  . Status post total bilateral knee replacement 11/13/2013  . Vitamin D deficiency     Past Surgical History:  Procedure Laterality Date  . ABDOMINAL HYSTERECTOMY    . AXILLARY SURGERY Left 06/25/2016   Aspiration of left axillary seroma   .  BREAST BIOPSY Left 03/2016  . BREAST LUMPECTOMY WITH RADIOACTIVE SEED AND SENTINEL LYMPH NODE BIOPSY Left 06/13/2016   Procedure: LEFT BREAST LUMPECTOMY WITH BRACKETED  RADIOACTIVE SEED AND SENTINEL LYMPH NODE BIOPSY;  Surgeon: Rolm Bookbinder, MD;  Location: [place];  Service: General;  Laterality: Left;  . BREAST RECONSTRUCTION Bilateral 06/25/2016   BILATERAL ONCOPLASTIC BREAST RECONSTRUCTION WITH BREAST REDUCTION  . BREAST RECONSTRUCTION WITH PLACEMENT OF TISSUE EXPANDER AND FLEX HD (ACELLULAR HYDRATED DERMIS) Bilateral 06/25/2016   Procedure: BILATERAL ONCOPLASTIC BREAST RECONSTRUCTION WITH BREAST REDUCTION, Aspiration of left axillary seroma;  Surgeon: Irene Limbo, MD;   Location: [place];  Service: Plastics;  Laterality: Bilateral;  . BREAST REDUCTION SURGERY Bilateral 06/25/2016   Procedure: MAMMARY REDUCTION  (BREAST) BILATERAL;  Surgeon: Irene Limbo, MD;  Location: [place];  Service: Plastics;  Laterality: Bilateral;  . CESAREAN SECTION  1966; 1971; 1973  . COLONOSCOPY    . DEBRIDEMENT AND CLOSURE WOUND Right 07/11/2016   Procedure: DEBRIDEMENT LEFT BREAST;  Surgeon: Irene Limbo, MD;  Location: [place];  Service: Plastics;  Laterality: Right;  . JOINT REPLACEMENT    . KNEE ARTHROSCOPY Right    "before replacement"  . MASTECTOMY COMPLETE / SIMPLE Left 08/02/2016   total  . REDUCTION MAMMAPLASTY Bilateral 06/25/2016  . TOTAL HIP ARTHROPLASTY  06/24/2012   Procedure: TOTAL HIP ARTHROPLASTY;  Surgeon: [name], MD;  [facility];  Service: Orthopedics;  Laterality: Left;  . TOTAL KNEE ARTHROPLASTY N/A 11/02/2013   Procedure: LEFT TOTAL KNEE ARTHROPLASTY WITH RIGHT KNEE CORTISONE INJECTION;  Surgeon: [name], MD;  [facility];  Service: Orthopedics;  Laterality: N/A;  . TOTAL KNEE ARTHROPLASTY Right 05/31/2014   Procedure: RIGHT TOTAL KNEE ARTHROPLASTY;  Surgeon: [name], MD;  [facility];  Service: Orthopedics;  Laterality: Right;  . TOTAL MASTECTOMY Left 08/02/2016   Procedure: LEFT TOTAL MASTECTOMY;  Surgeon: Rolm Bookbinder, MD;  Location: [place];  Service: General;  Laterality: Left;  . TUBAL LIGATION  1973  . WISDOM TOOTH EXTRACTION  1970's   admitted to hospital for surgery    Current Outpatient Medications  Medication Sig Dispense Refill  . anastrozole (ARIMIDEX) 1 MG tablet Take 1 tablet (1 mg total) by mouth daily. 90 tablet 0  . cholecalciferol (VITAMIN D) 1000 units tablet Take 1,000 Units by mouth daily.    . clonazePAM (KLONOPIN) 1 MG tablet Take 2 mg by mouth 2 (two) times daily.     . cyclobenzaprine (FLEXERIL) 10 MG tablet     . fluorouracil (EFUDEX) 5 % cream fluorouracil 5 % topical cream    .  hydrALAZINE (APRESOLINE) 50 MG tablet     . HYDROcodone-acetaminophen (NORCO) 10-325 MG tablet     . MAGNESIUM PO Take 2 tablets by mouth daily.    . metoprolol tartrate (LOPRESSOR) 25 MG tablet Take 25 mg by mouth 2 (two) times daily.     . Misc Natural Products (TART CHERRY ADVANCED PO) Take 1 capsule by mouth daily.    . nitrofurantoin (MACRODANTIN) 100 MG capsule Take 100 mg by mouth 4 (four) times daily.    . Omega-3 Fatty Acids (FISH OIL) 1000 MG CAPS Take 1,000 mg by mouth daily.    Marland Kitchen oxybutynin (DITROPAN-XL) 10 MG 24 hr tablet     . Saccharomyces boulardii (PROBIOTIC) 250 MG CAPS Take by mouth. 60 capsule   . triamterene-hydrochlorothiazide (MAXZIDE-25) 37.5-25 MG tablet     . Turmeric 500 MG CAPS Take 500 mg by mouth  daily.     . valsartan (DIOVAN) 320 MG tablet     . venlafaxine XR (EFFEXOR-XR) 75 MG 24 hr capsule Take 1 capsule by mouth twice daily 180 capsule 0  . vitamin C (ASCORBIC ACID) 500 MG tablet Take 1,000 mg by mouth daily.    Marland Kitchen zolpidem (AMBIEN) 10 MG tablet zolpidem 10 mg tablet     No current facility-administered medications for this visit.   Facility-Administered Medications Ordered in Other Visits  Medication Dose Route Frequency Provider Last Rate Last Admin  . methocarbamol (ROBAXIN) tablet 500 mg  500 mg Oral Q6H PRN Rolm Bookbinder, MD      . metroNIDAZOLE (FLAGYL) tablet 500 mg  500 mg Oral Q8H Rolm Bookbinder, MD      . ondansetron (ZOFRAN-ODT) disintegrating tablet 4 mg  4 mg Oral Q6H PRN Rolm Bookbinder, MD       Or  . ondansetron [facility]) 4 mg in sodium chloride 0.9 % 50 mL IVPB  4 mg Intravenous Q6H PRN Rolm Bookbinder, MD      . oxyCODONE (Oxy IR/ROXICODONE) immediate release tablet 5-10 mg  5-10 mg Oral Q4H PRN Rolm Bookbinder, MD      . simethicone [facility]) chewable tablet 40 mg  40 mg Oral Q6H PRN Rolm Bookbinder, MD        Family History  Problem Relation Age of Onset  . Hypertension Mother   . Prostate cancer Father   .  Hypertension Father   . Heart Problems Father        quadruple bypass  . Hypertension Sister     Review of Systems  Constitutional: Negative.   HENT: Negative.   Eyes: Negative.   Respiratory: Negative.   Cardiovascular: Negative.   Gastrointestinal: Negative.   Endocrine: Negative.   Genitourinary: Positive for vaginal discharge.       Urinary leakage, vaginal itching & sweet smell  Musculoskeletal: Negative.   Skin: Negative.   Allergic/Immunologic: Negative.   Neurological: Negative.   Psychiatric/Behavioral: Negative.     Exam:   Temp 97.9 F (36.6 C) (Skin)   Ht 4' 11.5" (1.511 m)   Wt (!) 309 lb (140.2 kg)   BMI 61.37 kg/m   Height: 4' 11.5" (151.1 cm)  General appearance: alert, cooperative and appears stated age Breast:  Right breast s/p reduction with well healed scars, no masses, skin changes, LAD; left is status post mastectomy with significant scarring in location of surgery, area of concern is inferior edge of rib, no masses, LAD noted Lymph nodes: No abnormal inguinal nodes palpated Neurologic: Grossly normal   Pelvic: External genitalia:  no lesions, area of thickened appearing skin from itching on external labia majora bilaterally, no ulcerations, no erythema, no other lesions except skin tag lateral the left labia majora              Urethra:  normal appearing urethra with no masses, tenderness or lesions              Bartholins and Skenes: normal                 Vagina: normal appearing vagina with normal color and discharge, no lesions              Cervix: absent              Pap taken: No. Bimanual Exam:  Uterus:  uterus absent              Adnexa: normal adnexa  Anus:  no lesions  Chaperone, Royal Hawthorn, was present for exam.  A:  Vulvar itching Urinary incontinence Morbid obesity Recent antibiotic usage H/o TAH/BSO due to dermoid H/o left breast cancer 2017, ultimately with left simple mastectomy, area of concern for pt is  inferior edge of rib  P:   Affirm obtained.  If this is negative, will treat with topical steroid and recheck 1 month.  If not significantly improved at that time, would obtain vulvar biopsy

## 2020-01-26 ENCOUNTER — Ambulatory Visit
Admission: RE | Admit: 2020-01-26 | Discharge: 2020-01-26 | Disposition: A | Discharge status: Home / Self Care | Payer: Medicare Other | Source: Ambulatory Visit | Attending: Oncology | Admitting: Oncology

## 2020-01-26 DIAGNOSIS — Z1231 Encounter for screening mammogram for malignant neoplasm of breast: Secondary | ICD-10-CM

## 2020-01-26 DIAGNOSIS — Z6841 Body Mass Index (BMI) 40.0 and over, adult: Secondary | ICD-10-CM

## 2020-01-26 DIAGNOSIS — M8589 Other specified disorders of bone density and structure, multiple sites: Secondary | ICD-10-CM | POA: Diagnosis not present

## 2020-01-26 DIAGNOSIS — Z170 Estrogen receptor positive status [ER+]: Secondary | ICD-10-CM

## 2020-01-26 DIAGNOSIS — Z780 Asymptomatic menopausal state: Secondary | ICD-10-CM | POA: Diagnosis not present

## 2020-01-26 LAB — VAGINITIS/VAGINOSIS, DNA PROBE
Candida Species: NEGATIVE
Gardnerella vaginalis: NEGATIVE
Trichomonas vaginosis: NEGATIVE

## 2020-01-27 ENCOUNTER — Telehealth: Payer: Self-pay | Admitting: *Deleted

## 2020-01-27 ENCOUNTER — Other Ambulatory Visit: Payer: Self-pay | Admitting: Obstetrics & Gynecology

## 2020-01-27 LAB — URINE CULTURE

## 2020-01-27 MED ORDER — TRIAMCINOLONE ACETONIDE 0.5 % EX OINT
1.0000 | TOPICAL_OINTMENT | Freq: Two times a day (BID) | CUTANEOUS | 0 refills | Status: DC
Start: 2020-01-27 — End: 2020-02-18

## 2020-01-27 NOTE — Telephone Encounter (Signed)
Spoke with patient. Patient was seen in the office on 01/25/20, thought she was to start new topical steroid cream, nothing at pharmacy. Patient states she also received RX for Effexor, does not recall discussing this medication, wants to confirm she is to start Rx.   Advised patient I will confirm with Dr. Sabra Heck, our office will f/u with recommendations. Patient will hold off on starting Effexor until confirmed. Advised patient Dr. Sabra Heck is out of the office today, response may not be immediate, patient agreeable.   Routing to Dr. Sabra Heck.

## 2020-01-27 NOTE — Telephone Encounter (Signed)
Patient checked with pharmacy and was told the prescription was not there from yesterday. [facility] [address].

## 2020-01-27 NOTE — Telephone Encounter (Signed)
Rx for triamcinolone ointment sent to pharmacy.  RX will read apply BID.  Pt cannot reach labia so has another person apply.  It is ok if only used once daily and this would be best if in the evening/night.  Recheck should be scheduled for 3 weeks.  I updated several prescriptions when she was in the office that were not correct from her medication list.  Effexor was one of them.  I thought I changed these to "no print" as that is the only way I know how to update a medication.  She should not get this one filled as she should already have it.  Thanks.

## 2020-01-27 NOTE — Telephone Encounter (Signed)
Spoke with patient, advised per Dr. Sabra Heck. Patient read back medication instructions and was thankful for return call for clarification. OV scheduled for 6/3 at 10:30am with Dr. Sabra Heck.   Routing to provider for final review. Patient is agreeable to disposition. Will close encounter.

## 2020-01-31 ENCOUNTER — Other Ambulatory Visit: Payer: Self-pay | Admitting: Obstetrics & Gynecology

## 2020-02-04 DIAGNOSIS — R0683 Snoring: Secondary | ICD-10-CM | POA: Diagnosis not present

## 2020-02-16 NOTE — Progress Notes (Addendum)
GYNECOLOGY  VISIT  CC:   Follow up vulvar itching  HPI: 79 y.o. G3P0 Married White or Caucasian female here for f/u of vaginal itching.  Used triamcinolone 0.5% bid but stopped about 5 days ago.  Symptoms have completely resolved.  She didn't have any skin changes at first visit that warranted a biopsy but we planned follow up in case symptoms did not resolve.  I would do a skin biopsy at that point.  Pt and I discussed possible topical products that could be causing issues.  She has started a probiotic and vaginal moisture has improved.  Testing for yeast and BV was negative.    GYNECOLOGIC HISTORY: No LMP recorded. Patient has had a hysterectomy. Contraception: hysterectomy Menopausal hormone therapy: none  Patient Active Problem List   Diagnosis Date Noted  . Primary osteoarthritis of both knees 10/25/2016  . New onset atrial fibrillation ([place])   . Cellulitis of left breast 08/29/2016  . Hypotension 08/29/2016  . Confusion 08/29/2016  . RBBB (right bundle branch block) 07/27/2016  . CRI (chronic renal insufficiency) 07/27/2016  . DDD (degenerative disc disease), lumbar 07/27/2016  . Primary osteoarthritis of both feet 07/27/2016  . Primary osteoarthritis of both hands 07/27/2016  . Inflammatory arthritis 07/27/2016  . Malignant neoplasm of upper-inner quadrant of left breast in female, estrogen receptor positive ([place]) 05/31/2016  . Depression 06/03/2014  . Constipation 11/19/2013  . Insomnia 11/19/2013  . Anxiety 11/19/2013  . Status post total bilateral knee replacement 11/13/2013  . Essential hypertension   . GERD (gastroesophageal reflux disease)   . OA (osteoarthritis) of knee 11/02/2013  . OA (osteoarthritis) of hip 06/24/2012    Past Medical History:  Diagnosis Date  . Anemia    hx of   . Anxiety   . Breast cancer in female [facility]) 03/2016   left  . Chronic bronchitis ([place])    FLARE UPS USUALLY ONCE A YEAR  . Chronic lower back pain   . Complication of anesthesia     trouble waking up   . CRI (chronic renal insufficiency) 07/27/2016  . DDD (degenerative disc disease), lumbar   . Depression   . GERD (gastroesophageal reflux disease)   . History of recent fall    "twice on Sunday; once yesterday" (08/29/2016)  . Hypertension   . Inflammatory arthritis 07/27/2016   Sero Negative, Positive Synovitis hands, WJ  . Insomnia   . Irregular heart beats   . Macular degeneration of right eye   . New onset atrial fibrillation ([place])   . OA (osteoarthritis) of hip 06/24/2012  . OA (osteoarthritis) of knee 11/02/2013   L knee   . Obesity   . Osteoarthritis of both feet 07/27/2016  . Osteoarthritis of both hands 07/27/2016  . PONV (postoperative nausea and vomiting)    SEVERE N&V AND DIARRHEA AFTER HYSTERECTOMY AND AFTER WISDOM TEETH EXTRACTIONS - NO PROBLEMS WITH LAST 2 SURGERIES - THE HIP AND LEFT KNEE  . Postop Acute blood loss anemia 06/25/2012  . Postop Hyponatremia 06/25/2012  . Postop Sinus tachycardia 06/27/2012  . RBBB (right bundle branch block) 07/27/2016  . Rheumatoid arthritis [facility])    "doctor recently took me off all RX for this" (08/02/2016)  . Right bundle branch block   . Right bundle branch block   . S/p dental crown    dental crowns on every tooth  . Status post total bilateral knee replacement 11/13/2013  . Vitamin D deficiency     Past Surgical History:  Procedure Laterality  Date  . AXILLARY SURGERY Left 06/25/2016   Aspiration of left axillary seroma   . BREAST BIOPSY Left 03/2016  . BREAST LUMPECTOMY WITH RADIOACTIVE SEED AND SENTINEL LYMPH NODE BIOPSY Left 06/13/2016   Procedure: LEFT BREAST LUMPECTOMY WITH BRACKETED  RADIOACTIVE SEED AND SENTINEL LYMPH NODE BIOPSY;  Surgeon: Rolm Bookbinder, MD;  Location: [place];  Service: General;  Laterality: Left;  . BREAST RECONSTRUCTION Bilateral 06/25/2016   BILATERAL ONCOPLASTIC BREAST RECONSTRUCTION WITH BREAST REDUCTION  . BREAST RECONSTRUCTION WITH PLACEMENT OF TISSUE EXPANDER AND  FLEX HD (ACELLULAR HYDRATED DERMIS) Bilateral 06/25/2016   Procedure: BILATERAL ONCOPLASTIC BREAST RECONSTRUCTION WITH BREAST REDUCTION, Aspiration of left axillary seroma;  Surgeon: Irene Limbo, MD;  Location: [place];  Service: Plastics;  Laterality: Bilateral;  . BREAST REDUCTION SURGERY Bilateral 06/25/2016   Procedure: MAMMARY REDUCTION  (BREAST) BILATERAL;  Surgeon: Irene Limbo, MD;  Location: [place];  Service: Plastics;  Laterality: Bilateral;  . CESAREAN SECTION  1966; 1971; 1973  . COLONOSCOPY    . DEBRIDEMENT AND CLOSURE WOUND Right 07/11/2016   Procedure: DEBRIDEMENT LEFT BREAST;  Surgeon: Irene Limbo, MD;  Location: [place];  Service: Plastics;  Laterality: Right;  . JOINT REPLACEMENT    . KNEE ARTHROSCOPY Right    "before replacement"  . MASTECTOMY COMPLETE / SIMPLE Left 08/02/2016   total  . REDUCTION MAMMAPLASTY Bilateral 06/25/2016  . TOTAL ABDOMINAL HYSTERECTOMY W/ BILATERAL SALPINGOOPHORECTOMY    . TOTAL HIP ARTHROPLASTY  06/24/2012   Procedure: TOTAL HIP ARTHROPLASTY;  Surgeon: [name], MD;  [facility];  Service: Orthopedics;  Laterality: Left;  . TOTAL KNEE ARTHROPLASTY N/A 11/02/2013   Procedure: LEFT TOTAL KNEE ARTHROPLASTY WITH RIGHT KNEE CORTISONE INJECTION;  Surgeon: [name], MD;  [facility];  Service: Orthopedics;  Laterality: N/A;  . TOTAL KNEE ARTHROPLASTY Right 05/31/2014   Procedure: RIGHT TOTAL KNEE ARTHROPLASTY;  Surgeon: [name], MD;  [facility];  Service: Orthopedics;  Laterality: Right;  . TOTAL MASTECTOMY Left 08/02/2016   Procedure: LEFT TOTAL MASTECTOMY;  Surgeon: Rolm Bookbinder, MD;  Location: [place];  Service: General;  Laterality: Left;  . TUBAL LIGATION  1973  . WISDOM TOOTH EXTRACTION  1970's   admitted to hospital for surgery    MEDS:   Current Outpatient Medications on File Prior to Visit  Medication Sig Dispense Refill  . amLODipine (NORVASC) 2.5 MG tablet Take 2.5 mg by mouth daily.    Marland Kitchen  anastrozole (ARIMIDEX) 1 MG tablet Take 1 tablet (1 mg total) by mouth daily. 90 tablet 0  . cholecalciferol (VITAMIN D) 1000 units tablet Take 1,000 Units by mouth daily.    . clonazePAM (KLONOPIN) 1 MG tablet Take 2 mg by mouth 2 (two) times daily.     . cyclobenzaprine (FLEXERIL) 10 MG tablet     . fluorouracil (EFUDEX) 5 % cream fluorouracil 5 % topical cream    . hydrALAZINE (APRESOLINE) 50 MG tablet     . HYDROcodone-acetaminophen (NORCO) 10-325 MG tablet     . MAGNESIUM PO Take 2 tablets by mouth daily.    . metoprolol tartrate (LOPRESSOR) 25 MG tablet Take 25 mg by mouth 2 (two) times daily.     . Misc Natural Products (TART CHERRY ADVANCED PO) Take 1 capsule by mouth daily.    . nitrofurantoin (MACRODANTIN) 100 MG capsule Take 1 capsule (100 mg total) by mouth at bedtime.    . Omega-3 Fatty Acids (FISH OIL) 1000 MG CAPS Take 1,000 mg by  mouth daily.    Marland Kitchen oxybutynin (DITROPAN-XL) 10 MG 24 hr tablet     . Saccharomyces boulardii (PROBIOTIC) 250 MG CAPS Take by mouth. 60 capsule   . triamcinolone ointment (KENALOG) 0.5 % Apply 1 application topically 2 (two) times daily. 30 g 0  . triamterene-hydrochlorothiazide (MAXZIDE-25) 37.5-25 MG tablet     . Turmeric 500 MG CAPS Take 500 mg by mouth daily.     . valsartan (DIOVAN) 320 MG tablet     . venlafaxine XR (EFFEXOR-XR) 75 MG 24 hr capsule Take 1 capsule (75 mg total) by mouth daily with breakfast. 180 capsule 0  . vitamin C (ASCORBIC ACID) 500 MG tablet Take 1,000 mg by mouth daily.    Marland Kitchen zolpidem (AMBIEN) 10 MG tablet zolpidem 10 mg tablet     Current Facility-Administered Medications on File Prior to Visit  Medication Dose Route Frequency Provider Last Rate Last Admin  . methocarbamol (ROBAXIN) tablet 500 mg  500 mg Oral Q6H PRN Rolm Bookbinder, MD      . metroNIDAZOLE (FLAGYL) tablet 500 mg  500 mg Oral Q8H Rolm Bookbinder, MD      . ondansetron (ZOFRAN-ODT) disintegrating tablet 4 mg  4 mg Oral Q6H PRN Rolm Bookbinder, MD        Or  . ondansetron [facility]) 4 mg in sodium chloride 0.9 % 50 mL IVPB  4 mg Intravenous Q6H PRN Rolm Bookbinder, MD      . oxyCODONE (Oxy IR/ROXICODONE) immediate release tablet 5-10 mg  5-10 mg Oral Q4H PRN Rolm Bookbinder, MD      . simethicone [facility]) chewable tablet 40 mg  40 mg Oral Q6H PRN Rolm Bookbinder, MD        ALLERGIES: Morphine and related  Family History  Problem Relation Age of Onset  . Hypertension Mother   . Prostate cancer Father   . Hypertension Father   . Heart Problems Father        quadruple bypass  . Hypertension Sister     SH:  Married, non smoker  Review of Systems  Constitutional: Negative.   HENT: Negative.   Eyes: Negative.   Respiratory: Negative.   Cardiovascular: Negative.   Gastrointestinal: Negative.   Endocrine: Negative.   Genitourinary: Negative.   Musculoskeletal: Negative.   Skin: Negative.   Allergic/Immunologic: Negative.   Neurological: Negative.   Psychiatric/Behavioral: Negative.     PHYSICAL EXAMINATION:    BP 110/72   Pulse 80   Temp 98.1 F (36.7 C) (Skin)   Resp 20     General appearance: alert, cooperative and appears stated age Lymph:  no inguinal LAD noted  Pelvic: External genitalia:  no lesions, skin appears normal without and discrete lesions/skin changes        Chaperone, Royal Hawthorn, CMA, was present for exam.  Assessment: Vulvar itching that resolved with about two weeks of topical steroid use  Plan: RF for clobetasol sent to pharmacy for pt to have on hand for prn use.  She is aware she can use this BID for up to 5 days, no more than one time monthly, for recurrent itching.  She knows to call for follow up if needs for more tan 5 days or more than once monthly.  Skin thinning discussed as possible steroid side effect.  Topical products to consider not using (possible causes of itching) discussed a well.

## 2020-02-17 DIAGNOSIS — G894 Chronic pain syndrome: Secondary | ICD-10-CM | POA: Diagnosis not present

## 2020-02-18 ENCOUNTER — Ambulatory Visit (INDEPENDENT_AMBULATORY_CARE_PROVIDER_SITE_OTHER): Payer: Medicare Other | Admitting: Obstetrics & Gynecology

## 2020-02-18 ENCOUNTER — Encounter: Payer: Self-pay | Admitting: Obstetrics & Gynecology

## 2020-02-18 ENCOUNTER — Other Ambulatory Visit: Payer: Self-pay

## 2020-02-18 VITALS — BP 110/72 | HR 80 | Temp 98.1°F | Resp 20

## 2020-02-18 DIAGNOSIS — L292 Pruritus vulvae: Secondary | ICD-10-CM

## 2020-02-18 MED ORDER — TRIAMCINOLONE ACETONIDE 0.5 % EX OINT: 1.0000 "application " | TOPICAL_OINTMENT | Freq: Two times a day (BID) | CUTANEOUS | 1 refills | Status: DC

## 2020-02-18 NOTE — Addendum Note (Signed)
Addended by: Megan Salon on: 02/18/2020 03:06 PM   Modules accepted: Orders

## 2020-03-24 ENCOUNTER — Other Ambulatory Visit: Payer: Self-pay

## 2020-03-24 ENCOUNTER — Ambulatory Visit (INDEPENDENT_AMBULATORY_CARE_PROVIDER_SITE_OTHER): Payer: Medicare Other | Admitting: Ophthalmology

## 2020-03-24 DIAGNOSIS — H35722 Serous detachment of retinal pigment epithelium, left eye: Secondary | ICD-10-CM | POA: Diagnosis not present

## 2020-03-24 DIAGNOSIS — H43822 Vitreomacular adhesion, left eye: Secondary | ICD-10-CM | POA: Diagnosis not present

## 2020-03-24 DIAGNOSIS — H3554 Dystrophies primarily involving the retinal pigment epithelium: Secondary | ICD-10-CM

## 2020-03-24 DIAGNOSIS — H353212 Exudative age-related macular degeneration, right eye, with inactive choroidal neovascularization: Secondary | ICD-10-CM | POA: Diagnosis not present

## 2020-03-24 NOTE — Progress Notes (Signed)
03/24/2020     CHIEF COMPLAINT Patient presents for Retina Follow Up   HISTORY OF PRESENT ILLNESS: Debra Griffith is a 79 y.o. female who presents to the clinic today for:   HPI    Retina Follow Up    Patient presents with  Dry AMD.  In both eyes.  Severity is moderate.  Duration of 4 months.  Since onset it is stable.  I, the attending physician,  performed the HPI with the patient and updated documentation appropriately.          Comments    4 Month AMD f\u OU. OCT  Pt states vision is about the same. Pt states she had a sleep study and does not have sleep apnea. Pt is having another sleep test done at her home.       Last edited by Tilda Franco on 03/24/2020  2:08 PM. (History)      Referring physician: Josetta Huddle, MD [address],  [place] [address]  HISTORICAL INFORMATION:   Selected notes from the [facility]: No current outpatient medications on file. (Ophthalmic Drugs)   No current facility-administered medications for this visit. (Ophthalmic Drugs)   Current Outpatient Medications (Other)  Medication Sig  . amLODipine (NORVASC) 2.5 MG tablet Take 2.5 mg by mouth daily.  Marland Kitchen anastrozole (ARIMIDEX) 1 MG tablet Take 1 tablet (1 mg total) by mouth daily.  . cholecalciferol (VITAMIN D) 1000 units tablet Take 1,000 Units by mouth daily.  . clonazePAM (KLONOPIN) 1 MG tablet Take 2 mg by mouth 2 (two) times daily.   . cyclobenzaprine (FLEXERIL) 10 MG tablet   . fluorouracil (EFUDEX) 5 % cream fluorouracil 5 % topical cream  . hydrALAZINE (APRESOLINE) 50 MG tablet   . HYDROcodone-acetaminophen (NORCO) 10-325 MG tablet   . MAGNESIUM PO Take 2 tablets by mouth daily.  . metoprolol tartrate (LOPRESSOR) 25 MG tablet Take 25 mg by mouth 2 (two) times daily.   . Misc Natural Products (TART CHERRY ADVANCED PO) Take 1 capsule by mouth daily.  . nitrofurantoin (MACRODANTIN) 100 MG capsule Take 1 capsule (100  mg total) by mouth at bedtime.  . Omega-3 Fatty Acids (FISH OIL) 1000 MG CAPS Take 1,000 mg by mouth daily.  Marland Kitchen oxybutynin (DITROPAN-XL) 10 MG 24 hr tablet   . Saccharomyces boulardii (PROBIOTIC) 250 MG CAPS Take by mouth.  . triamcinolone ointment (KENALOG) 0.5 % Apply 1 application topically 2 (two) times daily.  Marland Kitchen triamterene-hydrochlorothiazide (MAXZIDE-25) 37.5-25 MG tablet   . Turmeric 500 MG CAPS Take 500 mg by mouth daily.   . valsartan (DIOVAN) 320 MG tablet   . venlafaxine XR (EFFEXOR-XR) 75 MG 24 hr capsule Take 1 capsule (75 mg total) by mouth daily with breakfast.  . vitamin C (ASCORBIC ACID) 500 MG tablet Take 1,000 mg by mouth daily.  Marland Kitchen zolpidem (AMBIEN) 10 MG tablet zolpidem 10 mg tablet   No current facility-administered medications for this visit. (Other)   Facility-Administered Medications Ordered in Other Visits (Other)  Medication Route  . methocarbamol (ROBAXIN) tablet 500 mg Oral  . metroNIDAZOLE (FLAGYL) tablet 500 mg Oral  . ondansetron (ZOFRAN-ODT) disintegrating tablet 4 mg Oral   Or  . ondansetron (ZOFRAN) 4 mg in sodium chloride 0.9 % 50 mL IVPB Intravenous  . oxyCODONE (Oxy IR/ROXICODONE) immediate release tablet 5-10 mg Oral  . simethicone (MYLICON) chewable tablet 40 mg Oral  REVIEW OF SYSTEMS:    ALLERGIES Allergies  Allergen Reactions  . Morphine And Related Other (See Comments)    hallucinations    PAST MEDICAL HISTORY Past Medical History:  Diagnosis Date  . Anemia    hx of   . Anxiety   . Breast cancer in female [facility]) 03/2016   left  . Chronic bronchitis ([place])    FLARE UPS USUALLY ONCE A YEAR  . Chronic lower back pain   . Complication of anesthesia    trouble waking up   . CRI (chronic renal insufficiency) 07/27/2016  . DDD (degenerative disc disease), lumbar   . Depression   . GERD (gastroesophageal reflux disease)   . History of recent fall    "twice on Sunday; once yesterday" (08/29/2016)  . Hypertension   .  Inflammatory arthritis 07/27/2016   Sero Negative, Positive Synovitis hands, [place]  . Insomnia   . Irregular heart beats   . Macular degeneration of right eye   . New onset atrial fibrillation ([place])   . OA (osteoarthritis) of hip 06/24/2012  . OA (osteoarthritis) of knee 11/02/2013   L knee   . Obesity   . Osteoarthritis of both feet 07/27/2016  . Osteoarthritis of both hands 07/27/2016  . PONV (postoperative nausea and vomiting)    SEVERE N&V AND DIARRHEA AFTER HYSTERECTOMY AND AFTER WISDOM TEETH EXTRACTIONS - NO PROBLEMS WITH LAST 2 SURGERIES - THE HIP AND LEFT KNEE  . Postop Acute blood loss anemia 06/25/2012  . Postop Hyponatremia 06/25/2012  . Postop Sinus tachycardia 06/27/2012  . RBBB (right bundle branch block) 07/27/2016  . Rheumatoid arthritis [facility])    "doctor recently took me off all RX for this" (08/02/2016)  . Right bundle branch block   . Right bundle branch block   . S/p dental crown    dental crowns on every tooth  . Status post total bilateral knee replacement 11/13/2013  . Vitamin D deficiency    Past Surgical History:  Procedure Laterality Date  . AXILLARY SURGERY Left 06/25/2016   Aspiration of left axillary seroma   . BREAST BIOPSY Left 03/2016  . BREAST LUMPECTOMY WITH RADIOACTIVE SEED AND SENTINEL LYMPH NODE BIOPSY Left 06/13/2016   Procedure: LEFT BREAST LUMPECTOMY WITH BRACKETED  RADIOACTIVE SEED AND SENTINEL LYMPH NODE BIOPSY;  Surgeon: Rolm Bookbinder, MD;  Location: [place];  Service: General;  Laterality: Left;  . BREAST RECONSTRUCTION Bilateral 06/25/2016   BILATERAL ONCOPLASTIC BREAST RECONSTRUCTION WITH BREAST REDUCTION  . BREAST RECONSTRUCTION WITH PLACEMENT OF TISSUE EXPANDER AND FLEX HD (ACELLULAR HYDRATED DERMIS) Bilateral 06/25/2016   Procedure: BILATERAL ONCOPLASTIC BREAST RECONSTRUCTION WITH BREAST REDUCTION, Aspiration of left axillary seroma;  Surgeon: Irene Limbo, MD;  Location: [place];  Service: Plastics;  Laterality: Bilateral;  . BREAST  REDUCTION SURGERY Bilateral 06/25/2016   Procedure: MAMMARY REDUCTION  (BREAST) BILATERAL;  Surgeon: Irene Limbo, MD;  Location: [place];  Service: Plastics;  Laterality: Bilateral;  . CESAREAN SECTION  1966; 1971; 1973  . COLONOSCOPY    . DEBRIDEMENT AND CLOSURE WOUND Right 07/11/2016   Procedure: DEBRIDEMENT LEFT BREAST;  Surgeon: Irene Limbo, MD;  Location: [place];  Service: Plastics;  Laterality: Right;  . JOINT REPLACEMENT    . KNEE ARTHROSCOPY Right    "before replacement"  . MASTECTOMY COMPLETE / SIMPLE Left 08/02/2016   total  . REDUCTION MAMMAPLASTY Bilateral 06/25/2016  . TOTAL ABDOMINAL HYSTERECTOMY W/ BILATERAL SALPINGOOPHORECTOMY    . TOTAL HIP ARTHROPLASTY  06/24/2012   Procedure: TOTAL HIP ARTHROPLASTY;  Surgeon: [name], MD;  [facility];  Service: Orthopedics;  Laterality: Left;  . TOTAL KNEE ARTHROPLASTY N/A 11/02/2013   Procedure: LEFT TOTAL KNEE ARTHROPLASTY WITH RIGHT KNEE CORTISONE INJECTION;  Surgeon: [name], MD;  [facility];  Service: Orthopedics;  Laterality: N/A;  . TOTAL KNEE ARTHROPLASTY Right 05/31/2014   Procedure: RIGHT TOTAL KNEE ARTHROPLASTY;  Surgeon: [name], MD;  [facility];  Service: Orthopedics;  Laterality: Right;  . TOTAL MASTECTOMY Left 08/02/2016   Procedure: LEFT TOTAL MASTECTOMY;  Surgeon: Rolm Bookbinder, MD;  Location: [place];  Service: General;  Laterality: Left;  . TUBAL LIGATION  1973  . WISDOM TOOTH EXTRACTION  1970's   admitted to hospital for surgery    FAMILY HISTORY Family History  Problem Relation Age of Onset  . Hypertension Mother   . Prostate cancer Father   . Hypertension Father   . Heart Problems Father        quadruple bypass  . Hypertension Sister     SOCIAL HISTORY Social History   Tobacco Use  . Smoking status: Never Smoker  . Smokeless tobacco: Never Used  Substance Use Topics  . Alcohol use: No  . Drug use: Yes    Types: Other-see comments    Comment: CBD oil           OPHTHALMIC EXAM:  Base Eye Exam    Visual Acuity (Snellen - Linear)      Right Left   Dist cc 20/40 -1 20/25   Dist ph cc NI    Correction: Glasses       Tonometry (Tonopen, 2:14 PM)      Right Left   Pressure 12 15       Pupils      Pupils Dark Light Shape React APD   Right PERRL 4 3 Round Brisk None   Left PERRL 4 3 Round Brisk None       Visual Fields (Counting fingers)      Left Right    Full Full       Neuro/Psych    Oriented x3: Yes   Mood/Affect: Normal       Dilation    Both eyes: 1.0% Mydriacyl, 2.5% Phenylephrine @ 2:14 PM        Slit Lamp and Fundus Exam    External Exam      Right Left   External Normal Normal       Slit Lamp Exam      Right Left   Lids/Lashes Normal Normal   Conjunctiva/Sclera White and quiet White and quiet   Cornea Clear Clear   Anterior Chamber Deep and quiet Deep and quiet   Iris Round and reactive Round and reactive   Lens Centered posterior chamber intraocular lens Centered posterior chamber intraocular lens   Anterior Vitreous Normal Normal       Fundus Exam      Right Left   Posterior Vitreous Vitrectomized, clear Vitrectomized, clear   Disc Normal Normal   C/D Ratio 0.05 0.05   Macula Retinal pigment epithelial mottling, no macular thickening, Hard drusen, Geographic atrophy Retinal pigment epithelial mottling, no macular thickening, Hard drusen, Geographic atrophy   Vessels Normal Normal   Periphery Normal Normal          IMAGING AND PROCEDURES  Imaging and Procedures for 03/24/20  OCT, Retina - OU - Both Eyes       Right Eye Quality was good. Scan locations included  subfoveal. Central Foveal Thickness: 285. Progression has been stable. Findings include retinal drusen , no SRF, cystoid macular edema.   Left Eye Quality was good. Scan locations included subfoveal. Central Foveal Thickness: 300. Progression has been stable. Findings include subretinal hyper-reflective material, no SRF, no  IRF, intraretinal hyper-reflective material.   Notes Improved foveal cyst drusen deposits, status post vitrectomy for vitreal macular adhesion.                ASSESSMENT/PLAN:  No problem-specific Assessment & Plan notes found for this encounter.      ICD-10-CM   1. Vitreomacular adhesion of left eye  H43.822 OCT, Retina - OU - Both Eyes  2. Exudative age-related macular degeneration of right eye with inactive choroidal neovascularization (HCC)  H35.3212 OCT, Retina - OU - Both Eyes  3. Vitelliform macular dystrophy  H35.54   4. Serous detachment of retinal pigment epithelium of left eye  H35.722     1.  2.  3.  Ophthalmic Meds Ordered this visit:  No orders of the defined types were placed in this encounter.      Return in about 6 months (around 09/24/2020) for DILATE OU, OCT.  There are no Patient Instructions on file for this visit.   Explained the diagnoses, plan, and follow up with the patient and they expressed understanding.  Patient expressed understanding of the importance of proper follow up care.   Clent Demark Chirstopher Iovino M.D. Diseases & Surgery of the Retina and Vitreous [facility] [place] 03/24/20     Abbreviations: M myopia (nearsighted); A astigmatism; H hyperopia (farsighted); P presbyopia; Mrx spectacle prescription;  CTL contact lenses; OD right eye; OS left eye; OU both eyes  XT exotropia; ET esotropia; PEK punctate epithelial keratitis; PEE punctate epithelial erosions; DES dry eye syndrome; MGD meibomian gland dysfunction; ATs artificial tears; PFAT's preservative free artificial tears; [place] nuclear sclerotic cataract; PSC posterior subcapsular cataract; ERM epi-retinal membrane; PVD posterior vitreous detachment; RD retinal detachment; DM diabetes mellitus; DR diabetic retinopathy; NPDR non-proliferative diabetic retinopathy; PDR proliferative diabetic retinopathy; CSME clinically significant macular edema; DME diabetic macular edema; dbh dot  blot hemorrhages; CWS cotton wool spot; POAG primary open angle glaucoma; C/D cup-to-disc ratio; HVF humphrey visual field; GVF goldmann visual field; OCT optical coherence tomography; IOP intraocular pressure; BRVO Branch retinal vein occlusion; CRVO central retinal vein occlusion; CRAO central retinal artery occlusion; BRAO branch retinal artery occlusion; RT retinal tear; SB scleral buckle; PPV pars plana vitrectomy; VH Vitreous hemorrhage; PRP panretinal laser photocoagulation; IVK intravitreal kenalog; VMT vitreomacular traction; MH Macular hole;  NVD neovascularization of the disc; NVE neovascularization elsewhere; AREDS age related eye disease study; ARMD age related macular degeneration; POAG primary open angle glaucoma; EBMD epithelial/anterior basement membrane dystrophy; ACIOL anterior chamber intraocular lens; IOL intraocular lens; PCIOL posterior chamber intraocular lens; Phaco/IOL phacoemulsification with intraocular lens placement; [place] photorefractive keratectomy; LASIK laser assisted in situ keratomileusis; HTN hypertension; DM diabetes mellitus; COPD chronic obstructive pulmonary disease

## 2020-03-28 ENCOUNTER — Other Ambulatory Visit (HOSPITAL_BASED_OUTPATIENT_CLINIC_OR_DEPARTMENT_OTHER): Payer: Self-pay

## 2020-03-28 DIAGNOSIS — R0683 Snoring: Secondary | ICD-10-CM

## 2020-03-28 DIAGNOSIS — G4710 Hypersomnia, unspecified: Secondary | ICD-10-CM

## 2020-03-28 DIAGNOSIS — R0681 Apnea, not elsewhere classified: Secondary | ICD-10-CM

## 2020-04-05 DIAGNOSIS — R3915 Urgency of urination: Secondary | ICD-10-CM | POA: Diagnosis not present

## 2020-04-05 DIAGNOSIS — F4310 Post-traumatic stress disorder, unspecified: Secondary | ICD-10-CM | POA: Diagnosis not present

## 2020-04-05 DIAGNOSIS — I10 Essential (primary) hypertension: Secondary | ICD-10-CM | POA: Diagnosis not present

## 2020-04-05 DIAGNOSIS — E538 Deficiency of other specified B group vitamins: Secondary | ICD-10-CM | POA: Diagnosis not present

## 2020-04-05 DIAGNOSIS — Z0001 Encounter for general adult medical examination with abnormal findings: Secondary | ICD-10-CM | POA: Diagnosis not present

## 2020-04-05 DIAGNOSIS — E559 Vitamin D deficiency, unspecified: Secondary | ICD-10-CM | POA: Diagnosis not present

## 2020-04-05 DIAGNOSIS — F331 Major depressive disorder, recurrent, moderate: Secondary | ICD-10-CM | POA: Diagnosis not present

## 2020-04-05 DIAGNOSIS — G4733 Obstructive sleep apnea (adult) (pediatric): Secondary | ICD-10-CM | POA: Diagnosis not present

## 2020-04-05 DIAGNOSIS — E782 Mixed hyperlipidemia: Secondary | ICD-10-CM | POA: Diagnosis not present

## 2020-04-05 DIAGNOSIS — N390 Urinary tract infection, site not specified: Secondary | ICD-10-CM | POA: Diagnosis not present

## 2020-04-05 DIAGNOSIS — R251 Tremor, unspecified: Secondary | ICD-10-CM | POA: Diagnosis not present

## 2020-04-21 ENCOUNTER — Ambulatory Visit (HOSPITAL_BASED_OUTPATIENT_CLINIC_OR_DEPARTMENT_OTHER): Payer: Medicare Other | Attending: Internal Medicine | Admitting: Internal Medicine

## 2020-04-21 ENCOUNTER — Other Ambulatory Visit: Payer: Self-pay

## 2020-04-21 DIAGNOSIS — R0683 Snoring: Secondary | ICD-10-CM | POA: Insufficient documentation

## 2020-04-21 DIAGNOSIS — R5383 Other fatigue: Secondary | ICD-10-CM | POA: Diagnosis not present

## 2020-04-21 DIAGNOSIS — R0681 Apnea, not elsewhere classified: Secondary | ICD-10-CM

## 2020-04-21 DIAGNOSIS — R0902 Hypoxemia: Secondary | ICD-10-CM | POA: Insufficient documentation

## 2020-04-21 DIAGNOSIS — G4730 Sleep apnea, unspecified: Secondary | ICD-10-CM | POA: Diagnosis not present

## 2020-04-21 DIAGNOSIS — G4710 Hypersomnia, unspecified: Secondary | ICD-10-CM

## 2020-04-25 DIAGNOSIS — G4710 Hypersomnia, unspecified: Secondary | ICD-10-CM | POA: Diagnosis not present

## 2020-04-25 NOTE — Procedures (Signed)
   NAME: Debra Griffith DATE OF BIRTH:  Oct 25, 1940 MEDICAL RECORD NUMBER [id number]  LOCATION: [facility]  PHYSICIAN: Marius Ditch  DATE OF STUDY: 04/21/2020  SLEEP STUDY TYPE: Nocturnal Polysomnogram               REFERRING PHYSICIAN: Marius Ditch, MD  INDICATION FOR STUDY: Negative HSAT in patient with high risk of OSA. HSAT suggested hypoxemia. She has a h/o Afib and hypertension  EPWORTH SLEEPINESS SCORE:  NA HEIGHT: _0  (142.2 cm)  WEIGHT: (!) 302 lb (137 kg)    Body mass index is 67.71 kg/m.  NECK SIZE: 18 in.  MEDICATIONS Patient self administered medications include: HYDRALAZINE, CLONAZEPAM, NORCO, ASPIRIN, METOPROLOL TARTRATE, CYCLOBENZAPRINE HCL, NITROFUR MAC, PRESERVISION. Medications administered during study include No sleep medicine administered.Marland Kitchen  SLEEP STUDY TECHNIQUE A multi-channel overnight Polysomnography study was performed. The channels recorded and monitored were central and occipital EEG, electrooculogram (EOG), submentalis EMG (chin), nasal and oral airflow, thoracic and abdominal wall motion, anterior tibialis EMG, snore microphone, electrocardiogram, and a pulse oximetry.  TECHNICAL COMMENTS Comments added by Technician: NONE Comments added by Scorer: N/A  SLEEP ARCHITECTURE The study was initiated at 10:44:28 PM and terminated at 5:33:47 AM. The total recorded time was 409.3 minutes. EEG confirmed total sleep time was 283 minutes yielding a sleep efficiency of 69.1%%. Sleep onset after lights out was 65.7 minutes with a REM latency of N/A minutes. The patient spent 6.7%% of the night in stage N1 sleep, 93.3%% in stage N2 sleep, 0.0%% in stage N3 and 0% in REM. Wake after sleep onset (WASO) was 60.6 minutes. The Arousal Index was 29.5/hour.  RESPIRATORY PARAMETERS There were a total of 10 respiratory disturbances out of which 0 were apneas ( 0 obstructive, 0 mixed, 0 central) and 10 hypopneas. The apnea/hypopnea index (AHI)  was 2.1 events/hour. The central sleep apnea index was 0.0 events/hour. The REM AHI was N/A events/hour and NREM AHI was 2.1 events/hour. The supine AHI was N/A events/hour and the non supine AHI was 2.1 events/hour. Respiratory disturbances were associated with oxygen desaturation down to a nadir of 86.0% during sleep. The mean oxygen saturation during the study was 92.1%. The cumulative time under 88% oxygen saturation was 0.7 minutes.  LEG MOVEMENT DATA The total leg movements were 203 with a resulting leg movement index of 43.0/hr . Associated arousal with leg movement index was 15.9/hr.  CARDIAC DATA The underlying cardiac rhythm was most consistent with sinus rhythm. Mean heart rate during sleep was 72.8 bpm. Additional rhythm abnormalities include None.  IMPRESSIONS - No Significant Obstructive Sleep apnea(OSA) - No Significant Central Sleep Apnea (CSA) - No Significant Upper Airway Resistance Syndrome(UARS). - Significant periodic leg movements(PLMs) during sleep. Associated arousals were significant with an arousal index of 15.9 /hour.  DIAGNOSIS - Periodic Limb Movement During Sleep (G47.61)  RECOMMENDATIONS - Consider evaluation for possible causes of PLMs. However, the patient does not have excessive sleepiness as a major symptom so treatment may not be warranted. Notably, the patient does not have RLS symptoms. Also, it is noted that she is already on clonazepam  Marius Ditch Sleep specialist American Board of Internal Medicine  ELECTRONICALLY SIGNED ON:  04/25/2020, 8:30 PM [place] PH: [phone]   FX: [phone] [place]

## 2020-05-20 ENCOUNTER — Other Ambulatory Visit: Payer: Self-pay | Admitting: Oncology

## 2020-05-24 ENCOUNTER — Other Ambulatory Visit: Payer: Self-pay | Admitting: *Deleted

## 2020-05-24 MED ORDER — ANASTROZOLE 1 MG PO TABS: 1.0000 mg | ORAL_TABLET | Freq: Every day | ORAL | 0 refills | Status: DC

## 2020-05-26 ENCOUNTER — Encounter (INDEPENDENT_AMBULATORY_CARE_PROVIDER_SITE_OTHER): Payer: Medicare Other | Admitting: Ophthalmology

## 2020-05-31 DIAGNOSIS — N3020 Other chronic cystitis without hematuria: Secondary | ICD-10-CM | POA: Diagnosis not present

## 2020-05-31 DIAGNOSIS — N3941 Urge incontinence: Secondary | ICD-10-CM | POA: Diagnosis not present

## 2020-06-07 NOTE — Progress Notes (Signed)
[place]  [phone] [phone]     ID: Debra Griffith DOB: 01-25-1941  MR#: [id number]  XTK#:[id number]  Patient Care Team: Josetta Huddle, MD as PCP - General (Internal Medicine) Coreena Rubalcava, Virgie Dad, MD as Consulting Physician (Oncology) Rolm Bookbinder, MD as Consulting Physician (General Surgery) Irene Limbo, MD as Consulting Physician (Plastic Surgery) Gaynelle Arabian, MD as Consulting Physician (Orthopedic Surgery) Bo Merino, MD as Consulting Physician (Rheumatology) Delice Bison, Charlestine Massed, NP as Nurse Practitioner (Hematology and Oncology) Bjorn Loser, MD as Consulting Physician (Urology) OTHER MD:   CHIEF COMPLAINT: Estrogen receptor positive breast cancer (s/p left mastectomy)  CURRENT TREATMENT: Anastrozole   INTERVAL HISTORY: [name] returns today for follow-up of her estrogen receptor positive breast cancer.   She continues on anastrozole.    Since her last visit, she underwent right screening mammography with tomography at [facility] on 01/26/2020 showing: breast density category B; no evidence of malignancy.   She also underwent bone density screening the same day showing a T-score of -2.2, which is considered osteopenic.   REVIEW OF SYSTEMS: Debra Griffith was vaccinated x2 with [place] and did very well.  She is not exercising although she does have a stationary bike at home and she has a CNA from [place] who she says is going to make her work.  She is in a better diet now and has managed to lose 7 pounds.  She started Myrbetriq and that seems to be working for her although it is very expensive.  She had some vaginal issues which greatly improved on a probiotic.  She went off the venlafaxine on her own.  Apparently she ran out because she gave her husband some pills and that really took care of his ADD she says but unfortunately he took himself off after 2 weeks.  A detailed review of systems today was  otherwise stable.   BREAST CANCER HISTORY: From the original intake note:  Debra Griffith had minor trauma to the left breast but did not think much about it until while swimming on the medication she felt a hard mass in her left breast. She brought it to medical attention and on 05/04/2016 she underwent left diagnostic mammography with tomography at the breast Center. This showed the breast density to be category B. In the left breast upper inner quadrant there was a persistent area of asymmetry measuring 4.9 cm. On exam this was a firm but poorly defined palpable mass. Biopsy of this mass 05/04/2016 showed ([id number]) an invasive ductal carcinoma, grade 1, estrogen receptor 90% positive with moderate staining intensity, progesterone receptor 90% positive with strong staining intensity, with an MIB-1 of 10%, and no HER-2 amplification, the signals ratio being 1.19 and the number per cell 1.90.  On 05/09/2016 the patient had ultrasonography of the left axilla which was benign. On the same day she had a second biopsy of what is either a second mass or a distant area of the same mass (in the same quadrant) and this showed ([id number]) invasive ductal carcinoma, grade 2, estrogen receptor 100% positive, progesterone receptor 90% positive, both with strong staining intensity, with an MIB-1 of 15%, and no HER-2 amplification, the signals ratio being 1.50 and the number per cell 3.15.  The patient's subsequent history is as detailed below.   PAST MEDICAL HISTORY: Past Medical History:  Diagnosis Date  . Anemia    hx of   . Anxiety   . Breast cancer in female [facility]) 03/2016   left  .  Chronic bronchitis ([place])    FLARE UPS USUALLY ONCE A YEAR  . Chronic lower back pain   . Complication of anesthesia    trouble waking up   . CRI (chronic renal insufficiency) 07/27/2016  . DDD (degenerative disc disease), lumbar   . Depression   . GERD (gastroesophageal reflux disease)   . History of recent fall     "twice on Sunday; once yesterday" (08/29/2016)  . Hypertension   . Inflammatory arthritis 07/27/2016   Sero Negative, Positive Synovitis hands, [place]  . Insomnia   . Irregular heart beats   . Macular degeneration of right eye   . New onset atrial fibrillation ([place])   . OA (osteoarthritis) of hip 06/24/2012  . OA (osteoarthritis) of knee 11/02/2013   L knee   . Obesity   . Osteoarthritis of both feet 07/27/2016  . Osteoarthritis of both hands 07/27/2016  . PONV (postoperative nausea and vomiting)    SEVERE N&V AND DIARRHEA AFTER HYSTERECTOMY AND AFTER WISDOM TEETH EXTRACTIONS - NO PROBLEMS WITH LAST 2 SURGERIES - THE HIP AND LEFT KNEE  . Postop Acute blood loss anemia 06/25/2012  . Postop Hyponatremia 06/25/2012  . Postop Sinus tachycardia 06/27/2012  . RBBB (right bundle branch block) 07/27/2016  . Rheumatoid arthritis [facility])    "doctor recently took me off all RX for this" (08/02/2016)  . Right bundle branch block   . Right bundle branch block   . S/p dental crown    dental crowns on every tooth  . Status post total bilateral knee replacement 11/13/2013  . Vitamin D deficiency     PAST SURGICAL HISTORY: Past Surgical History:  Procedure Laterality Date  . AXILLARY SURGERY Left 06/25/2016   Aspiration of left axillary seroma   . BREAST BIOPSY Left 03/2016  . BREAST LUMPECTOMY WITH RADIOACTIVE SEED AND SENTINEL LYMPH NODE BIOPSY Left 06/13/2016   Procedure: LEFT BREAST LUMPECTOMY WITH BRACKETED  RADIOACTIVE SEED AND SENTINEL LYMPH NODE BIOPSY;  Surgeon: Rolm Bookbinder, MD;  Location: [place];  Service: General;  Laterality: Left;  . BREAST RECONSTRUCTION Bilateral 06/25/2016   BILATERAL ONCOPLASTIC BREAST RECONSTRUCTION WITH BREAST REDUCTION  . BREAST RECONSTRUCTION WITH PLACEMENT OF TISSUE EXPANDER AND FLEX HD (ACELLULAR HYDRATED DERMIS) Bilateral 06/25/2016   Procedure: BILATERAL ONCOPLASTIC BREAST RECONSTRUCTION WITH BREAST REDUCTION, Aspiration of left axillary seroma;  Surgeon:  Irene Limbo, MD;  Location: [place];  Service: Plastics;  Laterality: Bilateral;  . BREAST REDUCTION SURGERY Bilateral 06/25/2016   Procedure: MAMMARY REDUCTION  (BREAST) BILATERAL;  Surgeon: Irene Limbo, MD;  Location: [place];  Service: Plastics;  Laterality: Bilateral;  . CESAREAN SECTION  1966; 1971; 1973  . COLONOSCOPY    . DEBRIDEMENT AND CLOSURE WOUND Right 07/11/2016   Procedure: DEBRIDEMENT LEFT BREAST;  Surgeon: Irene Limbo, MD;  Location: [place];  Service: Plastics;  Laterality: Right;  . JOINT REPLACEMENT    . KNEE ARTHROSCOPY Right    "before replacement"  . MASTECTOMY COMPLETE / SIMPLE Left 08/02/2016   total  . REDUCTION MAMMAPLASTY Bilateral 06/25/2016  . TOTAL ABDOMINAL HYSTERECTOMY W/ BILATERAL SALPINGOOPHORECTOMY    . TOTAL HIP ARTHROPLASTY  06/24/2012   Procedure: TOTAL HIP ARTHROPLASTY;  Surgeon: [name], MD;  Location: [facility];  Service: Orthopedics;  Laterality: Left;  . TOTAL KNEE ARTHROPLASTY N/A 11/02/2013   Procedure: LEFT TOTAL KNEE ARTHROPLASTY WITH RIGHT KNEE CORTISONE INJECTION;  Surgeon: [name], MD;  Location: [facility];  Service: Orthopedics;  Laterality: N/A;  . TOTAL KNEE ARTHROPLASTY Right  05/31/2014   Procedure: RIGHT TOTAL KNEE ARTHROPLASTY;  Surgeon: [name], MD;  Location: [facility];  Service: Orthopedics;  Laterality: Right;  . TOTAL MASTECTOMY Left 08/02/2016   Procedure: LEFT TOTAL MASTECTOMY;  Surgeon: Rolm Bookbinder, MD;  Location: [place];  Service: General;  Laterality: Left;  . TUBAL LIGATION  1973  . WISDOM TOOTH EXTRACTION  1970's   admitted to hospital for surgery    FAMILY HISTORY Family History  Problem Relation Age of Onset  . Hypertension Mother   . Prostate cancer Father   . Hypertension Father   . Heart Problems Father        quadruple bypass  . Hypertension Sister     The patient's mother died at age 73. The patient's father died at age 52 following a stroke. The patient had no brothers, 1 sister.  There is no history of breast or ovarian cancer in the family.    GYNECOLOGIC HISTORY:  No LMP recorded. Patient has had a hysterectomy. Menarche age 35, first live birth age 33. The patient is GX P3. She underwent total abdominal hysterectomy, with bilateral salpingo-oophorectomy, in 1993. She used estrogen replacement approximately 9 months. She never used oral contraceptives.   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 Her husband [name] used to r be in businesses but he also is retired. Son Aaron Edelman is a Programme researcher, broadcasting/film/video in [place]. Son Legrand Como is a Engineer, maintenance (IT) in [employer]. Daughter Kimie Pidcock lives in [place]. 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 The patient has 5 grandchildren. She is a Nurse, learning disability, currently attending [employer]: In place    HEALTH MAINTENANCE: Social History   Tobacco Use  . Smoking status: Never Smoker  . Smokeless tobacco: Never Used  Substance Use Topics  . Alcohol use: No  . Drug use: Yes    Types: Other-see comments    Comment: CBD oil     Colonoscopy: 2015/[facility]  PAP:  Bone density: [facility] that showed: T-score of -1.8.    Allergies  Allergen Reactions  . Morphine And Related Other (See Comments)    hallucinations    Current Outpatient Medications  Medication Sig Dispense Refill  . amLODipine (NORVASC) 2.5 MG tablet Take 2.5 mg by mouth daily.    Marland Kitchen anastrozole (ARIMIDEX) 1 MG tablet Take 1 tablet (1 mg total) by mouth daily. 90 tablet 0  . cholecalciferol (VITAMIN D) 1000 units tablet Take 1,000 Units by mouth daily.    . clonazePAM (KLONOPIN) 1 MG tablet Take 2 mg by mouth 2 (two) times daily.     . cyclobenzaprine (FLEXERIL) 10 MG tablet     . fluorouracil (EFUDEX) 5 % cream fluorouracil 5 % topical cream    . hydrALAZINE (APRESOLINE) 50 MG tablet     . HYDROcodone-acetaminophen (NORCO) 10-325 MG tablet       . MAGNESIUM PO Take 2 tablets by mouth daily.    . metoprolol tartrate (LOPRESSOR) 25 MG tablet Take 25 mg by mouth 2 (two) times daily.     . Misc Natural Products (TART CHERRY ADVANCED PO) Take 1 capsule by mouth daily.    . nitrofurantoin (MACRODANTIN) 100 MG capsule Take 1 capsule (100 mg total) by mouth at bedtime.    . Omega-3 Fatty Acids (FISH OIL) 1000 MG CAPS Take 1,000 mg by mouth daily.    Marland Kitchen oxybutynin (DITROPAN-XL)  10 MG 24 hr tablet     . Saccharomyces boulardii (PROBIOTIC) 250 MG CAPS Take by mouth. 60 capsule   . triamcinolone ointment (KENALOG) 0.5 % Apply 1 application topically 2 (two) times daily. 30 g 1  . triamterene-hydrochlorothiazide (MAXZIDE-25) 37.5-25 MG tablet     . Turmeric 500 MG CAPS Take 500 mg by mouth daily.     . valsartan (DIOVAN) 320 MG tablet     . venlafaxine XR (EFFEXOR-XR) 75 MG 24 hr capsule Take 1 capsule (75 mg total) by mouth daily with breakfast. 180 capsule 0  . vitamin C (ASCORBIC ACID) 500 MG tablet Take 1,000 mg by mouth daily.    Marland Kitchen zolpidem (AMBIEN) 10 MG tablet zolpidem 10 mg tablet     No current facility-administered medications for this visit.   Facility-Administered Medications Ordered in Other Visits  Medication Dose Route Frequency Provider Last Rate Last Admin  . methocarbamol (ROBAXIN) tablet 500 mg  500 mg Oral Q6H PRN Rolm Bookbinder, MD      . metroNIDAZOLE (FLAGYL) tablet 500 mg  500 mg Oral Q8H Rolm Bookbinder, MD      . ondansetron (ZOFRAN-ODT) disintegrating tablet 4 mg  4 mg Oral Q6H PRN Rolm Bookbinder, MD       Or  . ondansetron [facility]) 4 mg in sodium chloride 0.9 % 50 mL IVPB  4 mg Intravenous Q6H PRN Rolm Bookbinder, MD      . oxyCODONE (Oxy IR/ROXICODONE) immediate release tablet 5-10 mg  5-10 mg Oral Q4H PRN Rolm Bookbinder, MD      . simethicone [facility]) chewable tablet 40 mg  40 mg Oral Q6H PRN Rolm Bookbinder, MD        OBJECTIVE: White woman using a Rollator  Vitals:   06/08/20 0919   BP: (!) 129/55  Pulse: 80  Temp: 97.7 F (36.5 C)  SpO2: 95%   Wt Readings from Last 3 Encounters:  06/08/20 298 lb 4.8 oz (135.3 kg)  04/21/20 (!) 302 lb (137 kg)  01/25/20 (!) 309 lb (140.2 kg)   Body mass index is 66.88 kg/m.    ECOG FS:1 - Symptomatic but completely ambulatory  Sclerae unicteric, EOMs intact Wearing a mask No cervical or supraclavicular adenopathy Lungs no rales or rhonchi Heart regular rate and rhythm Abd soft, obese, nontender, positive bowel sounds MSK no focal spinal tenderness, no upper extremity lymphedema Neuro: nonfocal, well oriented, appropriate affect Breasts: The right breast is unremarkable.  The left breast is status post mastectomy.  There is no evidence of local recurrence.  Both axillae are benign.  Breasts: The right breast is status post remote reduction mammoplasty.  There is no evidence of activity.  The left breast is status post mastectomy.  The incision has healed well and there is no evidence of disease activity.  Both axillae are benign.}   LAB RESULTS:  CMP     Component Value Date/Time   NA 141 06/09/2019 0850   NA 140 07/04/2017 1112   K 4.2 06/09/2019 0850   K 4.0 07/04/2017 1112   CL 102 06/09/2019 0850   CO2 30 06/09/2019 0850   CO2 28 07/04/2017 1112   GLUCOSE 107 (H) 06/09/2019 0850   GLUCOSE 106 07/04/2017 1112   BUN 22 06/09/2019 0850   BUN 26.7 (H) 07/04/2017 1112   CREATININE 1.05 (H) 06/09/2019 0850   CREATININE 1.14 (H) 02/05/2018 1012   CREATININE 1.1 07/04/2017 1112   CALCIUM 9.8 06/09/2019 0850   CALCIUM 9.8 07/04/2017 1112  PROT 6.8 06/09/2019 0850   PROT 6.9 07/04/2017 1112   ALBUMIN 4.0 06/09/2019 0850   ALBUMIN 3.8 07/04/2017 1112   AST 40 06/09/2019 0850   AST 26 02/05/2018 1012   AST 21 07/04/2017 1112   ALT 57 (H) 06/09/2019 0850   ALT 37 02/05/2018 1012   ALT 29 07/04/2017 1112   ALKPHOS 111 06/09/2019 0850   ALKPHOS 87 07/04/2017 1112   BILITOT 0.3 06/09/2019 0850   BILITOT 0.3  02/05/2018 1012   BILITOT 0.37 07/04/2017 1112   GFRNONAA 51 (L) 06/09/2019 0850   GFRNONAA 46 (L) 02/05/2018 1012   GFRAA 59 (L) 06/09/2019 0850   GFRAA 53 (L) 02/05/2018 1012    INo results found for: SPEP, UPEP  Lab Results  Component Value Date   WBC 8.0 06/08/2020   NEUTROABS 4.7 06/08/2020   HGB 12.7 06/08/2020   HCT 38.7 06/08/2020   MCV 91.7 06/08/2020   PLT 249 06/08/2020      Chemistry      Component Value Date/Time   NA 141 06/09/2019 0850   NA 140 07/04/2017 1112   K 4.2 06/09/2019 0850   K 4.0 07/04/2017 1112   CL 102 06/09/2019 0850   CO2 30 06/09/2019 0850   CO2 28 07/04/2017 1112   BUN 22 06/09/2019 0850   BUN 26.7 (H) 07/04/2017 1112   CREATININE 1.05 (H) 06/09/2019 0850   CREATININE 1.14 (H) 02/05/2018 1012   CREATININE 1.1 07/04/2017 1112   GLU 95 01/20/2016 0000      Component Value Date/Time   CALCIUM 9.8 06/09/2019 0850   CALCIUM 9.8 07/04/2017 1112   ALKPHOS 111 06/09/2019 0850   ALKPHOS 87 07/04/2017 1112   AST 40 06/09/2019 0850   AST 26 02/05/2018 1012   AST 21 07/04/2017 1112   ALT 57 (H) 06/09/2019 0850   ALT 37 02/05/2018 1012   ALT 29 07/04/2017 1112   BILITOT 0.3 06/09/2019 0850   BILITOT 0.3 02/05/2018 1012   BILITOT 0.37 07/04/2017 1112       No results found for: LABCA2  No components found for: LABCA125  No results for input(s): INR in the last 168 hours.  Urinalysis    Component Value Date/Time   COLORURINE YELLOW 08/29/2016 2120   APPEARANCEUR HAZY (A) 08/29/2016 2120   LABSPEC 1.014 08/29/2016 2120   PHURINE 5.0 08/29/2016 2120   GLUCOSEU NEGATIVE 08/29/2016 2120   HGBUR NEGATIVE 08/29/2016 2120   BILIRUBINUR n 01/25/2020 1101   KETONESUR NEGATIVE 08/29/2016 2120   PROTEINUR Negative 01/25/2020 1101   PROTEINUR NEGATIVE 08/29/2016 2120   UROBILINOGEN negative (A) 01/25/2020 1101   UROBILINOGEN 0.2 05/26/2014 1054   NITRITE n 01/25/2020 1101   NITRITE NEGATIVE 08/29/2016 2120   LEUKOCYTESUR Small (1+)  (A) 01/25/2020 1101    STUDIES: SLEEP STUDY DOCUMENTS  Result Date: 05/19/2020 Ordered by an unspecified provider.   ELIGIBLE FOR AVAILABLE RESEARCH PROTOCOL: no  ASSESSMENT: 79 y.o. [place] woman status post left breast upper inner quadrant biopsy 05/04/2016 for a clinical T2 N0 invasive ductal carcinoma, grade 1 estrogen and progesterone receptor positive, HER-2 nonamplified, with an MIB-1 of 10%.  (1) biopsy of a second area 05/09/2016 also in the upper inner quadrant of the left breast showed invasive ductal carcinoma, grade 2, estrogen and progesterone receptor positive, HER-2 nonamplified, with an MIB-1 of 15%.  (2) status post left lumpectomy and sentinel lymph node sampling 06/13/2016 for an mpT1c pN0(i+), stage IA invasive ductal carcinoma, grade 1, with close but  negative margins  (a) status post bilateral reduction mammoplasty with left oncoplastic surgery 06/25/2016  (b) status post debridement of left nipple/areolar necrosis 07/11/2016  (c) status post left simple mastectomy 08/02/2016 showing inflammation and abscess but no evidence of malignancy  (3) Oncotype score of 18 predicts a 10 year risk of recurrence outside the breast of 11% if the patient's only systemic therapy is tamoxifen for 5 years. It also predicts no significant benefit from chemotherapy  (4) postmastectomy radiation not indicated  (5) started anastrozole September 2017  (a) DEXA scan 04/17/2017 showed a T score of -1.8.   PLAN: Stormi is now just about 4 years out from definitive surgery for her breast cancer with no evidence of disease recurrence.  This is very favorable.  She is tolerating anastrozole well and the plan is to continue that a total of 5 years.  When she sees me a year from now she will be ready to "graduate".  I asked her to not go off the venlafaxine without telling us as we will give her a taper if she wishes to come off that medication.  I encouraged her to continue and extend  her exercise program and diet.  She is aware that morbid obesity is her worst health problem  Total encounter time 25 minutes.*   Yeilin Zweber, Virgie Dad, MD  06/08/20 9:47 AM Medical Oncology and Hematology [facility] [place], [place] [address] Tel. [phone]    Fax. [phone]   I, Wilburn Mylar, am acting as scribe for Dr. Virgie Dad. Ismaeel Arvelo.  I, Lurline Del MD, have reviewed the above documentation for accuracy and completeness, and I agree with the above.    *Total Encounter Time as defined by the Centers for Medicare and Medicaid Services includes, in addition to the face-to-face time of a patient visit (documented in the note above) non-face-to-face time: obtaining and reviewing outside history, ordering and reviewing medications, tests or procedures, care coordination (communications with other health care professionals or caregivers) and documentation in the medical record.

## 2020-06-08 ENCOUNTER — Other Ambulatory Visit: Payer: Self-pay

## 2020-06-08 ENCOUNTER — Inpatient Hospital Stay: Payer: Medicare Other | Attending: Oncology | Admitting: Oncology

## 2020-06-08 ENCOUNTER — Inpatient Hospital Stay: Payer: Medicare Other

## 2020-06-08 VITALS — BP 129/55 | HR 80 | Temp 97.7°F | Ht <= 58 in | Wt 298.3 lb

## 2020-06-08 DIAGNOSIS — M069 Rheumatoid arthritis, unspecified: Secondary | ICD-10-CM | POA: Diagnosis not present

## 2020-06-08 DIAGNOSIS — Z170 Estrogen receptor positive status [ER+]: Secondary | ICD-10-CM | POA: Diagnosis not present

## 2020-06-08 DIAGNOSIS — C50212 Malignant neoplasm of upper-inner quadrant of left female breast: Secondary | ICD-10-CM | POA: Diagnosis not present

## 2020-06-08 DIAGNOSIS — E559 Vitamin D deficiency, unspecified: Secondary | ICD-10-CM | POA: Diagnosis not present

## 2020-06-08 DIAGNOSIS — I10 Essential (primary) hypertension: Secondary | ICD-10-CM | POA: Insufficient documentation

## 2020-06-08 DIAGNOSIS — Z79899 Other long term (current) drug therapy: Secondary | ICD-10-CM | POA: Insufficient documentation

## 2020-06-08 DIAGNOSIS — G4730 Sleep apnea, unspecified: Secondary | ICD-10-CM | POA: Diagnosis not present

## 2020-06-08 DIAGNOSIS — M5136 Other intervertebral disc degeneration, lumbar region: Secondary | ICD-10-CM | POA: Insufficient documentation

## 2020-06-08 DIAGNOSIS — K219 Gastro-esophageal reflux disease without esophagitis: Secondary | ICD-10-CM | POA: Diagnosis not present

## 2020-06-08 DIAGNOSIS — M1990 Unspecified osteoarthritis, unspecified site: Secondary | ICD-10-CM | POA: Diagnosis not present

## 2020-06-08 DIAGNOSIS — E669 Obesity, unspecified: Secondary | ICD-10-CM | POA: Diagnosis not present

## 2020-06-08 DIAGNOSIS — F419 Anxiety disorder, unspecified: Secondary | ICD-10-CM | POA: Diagnosis not present

## 2020-06-08 LAB — CBC WITH DIFFERENTIAL/PLATELET
Abs Immature Granulocytes: 0.07 10*3/uL (ref 0.00–0.07)
Basophils Absolute: 0.1 10*3/uL (ref 0.0–0.1)
Basophils Relative: 1 %
Eosinophils Absolute: 0.3 10*3/uL (ref 0.0–0.5)
Eosinophils Relative: 4 %
HCT: 38.7 % (ref 36.0–46.0)
Hemoglobin: 12.7 g/dL (ref 12.0–15.0)
Immature Granulocytes: 1 %
Lymphocytes Relative: 25 %
Lymphs Abs: 2 10*3/uL (ref 0.7–4.0)
MCH: 30.1 pg (ref 26.0–34.0)
MCHC: 32.8 g/dL (ref 30.0–36.0)
MCV: 91.7 fL (ref 80.0–100.0)
Monocytes Absolute: 0.9 10*3/uL (ref 0.1–1.0)
Monocytes Relative: 11 %
Neutro Abs: 4.7 10*3/uL (ref 1.7–7.7)
Neutrophils Relative %: 58 %
Platelets: 249 10*3/uL (ref 150–400)
RBC: 4.22 MIL/uL (ref 3.87–5.11)
RDW: 12.7 % (ref 11.5–15.5)
WBC: 8 10*3/uL (ref 4.0–10.5)
nRBC: 0 % (ref 0.0–0.2)

## 2020-06-08 LAB — COMPREHENSIVE METABOLIC PANEL
ALT: 36 U/L (ref 0–44)
AST: 25 U/L (ref 15–41)
Albumin: 3.6 g/dL (ref 3.5–5.0)
Alkaline Phosphatase: 86 U/L (ref 38–126)
Anion gap: 8 (ref 5–15)
BUN: 21 mg/dL (ref 8–23)
CO2: 26 mmol/L (ref 22–32)
Calcium: 9.6 mg/dL (ref 8.9–10.3)
Chloride: 103 mmol/L (ref 98–111)
Creatinine, Ser: 0.87 mg/dL (ref 0.44–1.00)
GFR calc Af Amer: >60 mL/min (ref >60)
GFR calc non Af Amer: >60 mL/min (ref >60)
Glucose, Bld: 124 mg/dL — ABNORMAL HIGH (ref 70–99)
Potassium: 3.9 mmol/L (ref 3.5–5.1)
Sodium: 137 mmol/L (ref 135–145)
Total Bilirubin: 0.3 mg/dL (ref 0.3–1.2)
Total Protein: 6.7 g/dL (ref 6.5–8.1)

## 2020-06-08 MED ORDER — VENLAFAXINE HCL ER 75 MG PO CP24: 75.0000 mg | ORAL_CAPSULE | Freq: Every day | ORAL | 4 refills | Status: DC

## 2020-06-08 MED ORDER — ANASTROZOLE 1 MG PO TABS: 1.0000 mg | ORAL_TABLET | Freq: Every day | ORAL | 0 refills | Status: DC

## 2020-06-09 ENCOUNTER — Other Ambulatory Visit: Payer: Self-pay | Admitting: Oncology

## 2020-06-16 DIAGNOSIS — Z23 Encounter for immunization: Secondary | ICD-10-CM | POA: Diagnosis not present

## 2020-06-21 DIAGNOSIS — N3941 Urge incontinence: Secondary | ICD-10-CM | POA: Diagnosis not present

## 2020-06-21 DIAGNOSIS — R350 Frequency of micturition: Secondary | ICD-10-CM | POA: Diagnosis not present

## 2020-06-28 DIAGNOSIS — R350 Frequency of micturition: Secondary | ICD-10-CM | POA: Diagnosis not present

## 2020-06-28 DIAGNOSIS — N3941 Urge incontinence: Secondary | ICD-10-CM | POA: Diagnosis not present

## 2020-06-28 DIAGNOSIS — G894 Chronic pain syndrome: Secondary | ICD-10-CM | POA: Diagnosis not present

## 2020-07-05 DIAGNOSIS — N3941 Urge incontinence: Secondary | ICD-10-CM | POA: Diagnosis not present

## 2020-07-05 DIAGNOSIS — R350 Frequency of micturition: Secondary | ICD-10-CM | POA: Diagnosis not present

## 2020-07-12 DIAGNOSIS — R350 Frequency of micturition: Secondary | ICD-10-CM | POA: Diagnosis not present

## 2020-07-12 DIAGNOSIS — N3941 Urge incontinence: Secondary | ICD-10-CM | POA: Diagnosis not present

## 2020-07-19 DIAGNOSIS — N3941 Urge incontinence: Secondary | ICD-10-CM | POA: Diagnosis not present

## 2020-07-19 DIAGNOSIS — R350 Frequency of micturition: Secondary | ICD-10-CM | POA: Diagnosis not present

## 2020-08-02 DIAGNOSIS — R350 Frequency of micturition: Secondary | ICD-10-CM | POA: Diagnosis not present

## 2020-08-02 DIAGNOSIS — N3941 Urge incontinence: Secondary | ICD-10-CM | POA: Diagnosis not present

## 2020-08-09 DIAGNOSIS — N3941 Urge incontinence: Secondary | ICD-10-CM | POA: Diagnosis not present

## 2020-08-09 DIAGNOSIS — R350 Frequency of micturition: Secondary | ICD-10-CM | POA: Diagnosis not present

## 2020-08-16 ENCOUNTER — Other Ambulatory Visit: Payer: Self-pay | Admitting: Oncology

## 2020-08-16 DIAGNOSIS — N3941 Urge incontinence: Secondary | ICD-10-CM | POA: Diagnosis not present

## 2020-08-16 DIAGNOSIS — Z23 Encounter for immunization: Secondary | ICD-10-CM | POA: Diagnosis not present

## 2020-08-20 IMAGING — [facility] DIGITAL SCREENING UNILATERAL RIGHT MAMMOGRAM WITH CAD AND TOMO
8 series · 8 of 24 positions shown · non-contrast
Comparison: Previous exam(s).

CLINICAL DATA: Screening.

EXAM:
DIGITAL SCREENING UNILATERAL RIGHT MAMMOGRAM WITH CAD AND TOMO

[R CC synth-2D (1 of 2)]
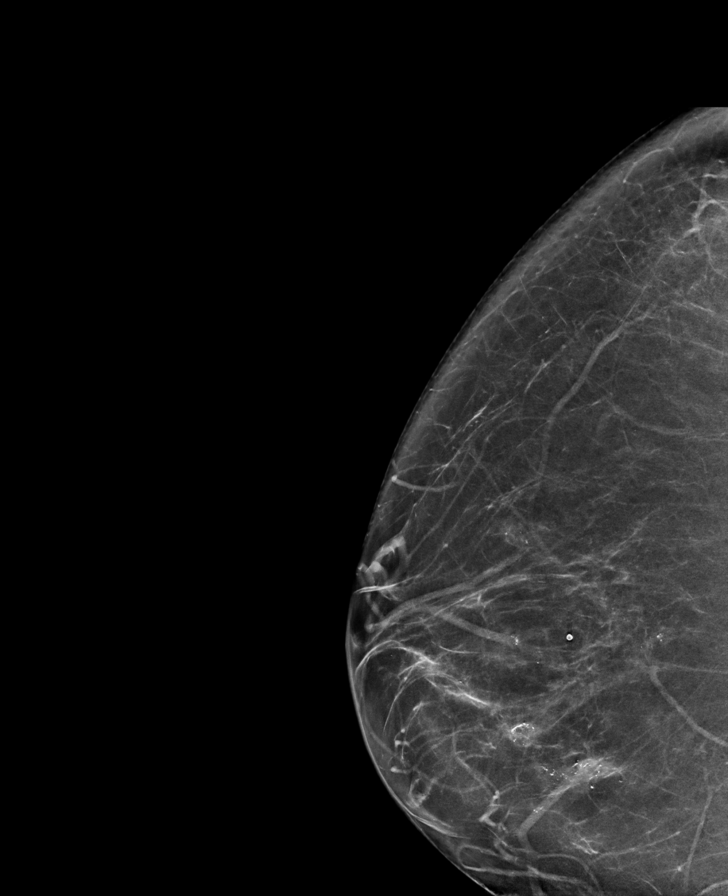

[R CC synth-2D (2 of 2)]
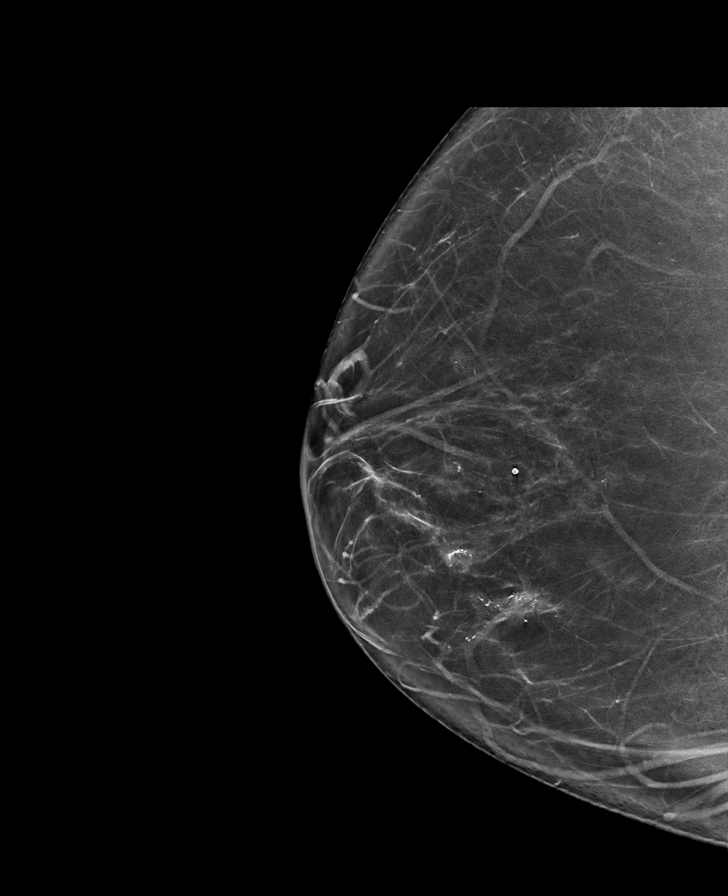

[R MLO synth-2D (1 of 2)]
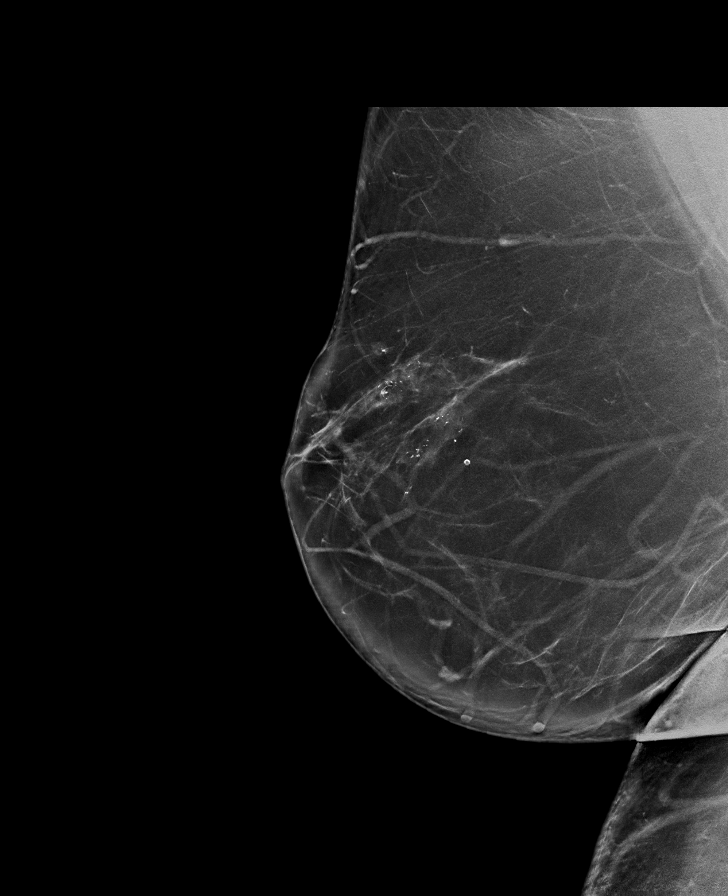

[R MLO synth-2D (2 of 2)]
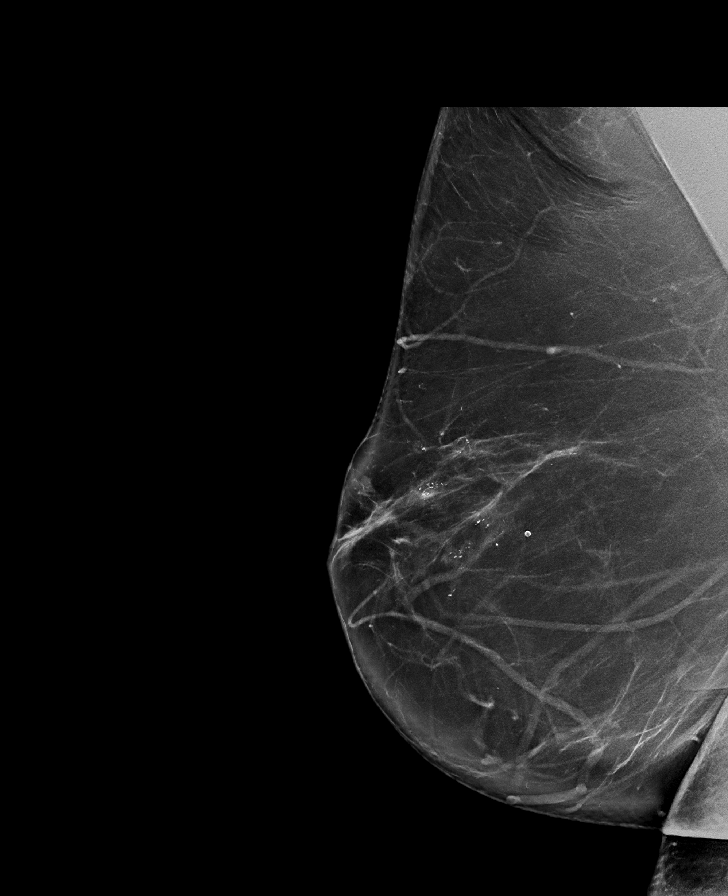

[R CC tomo (1 of 2) · tomo slice 39/78.0]
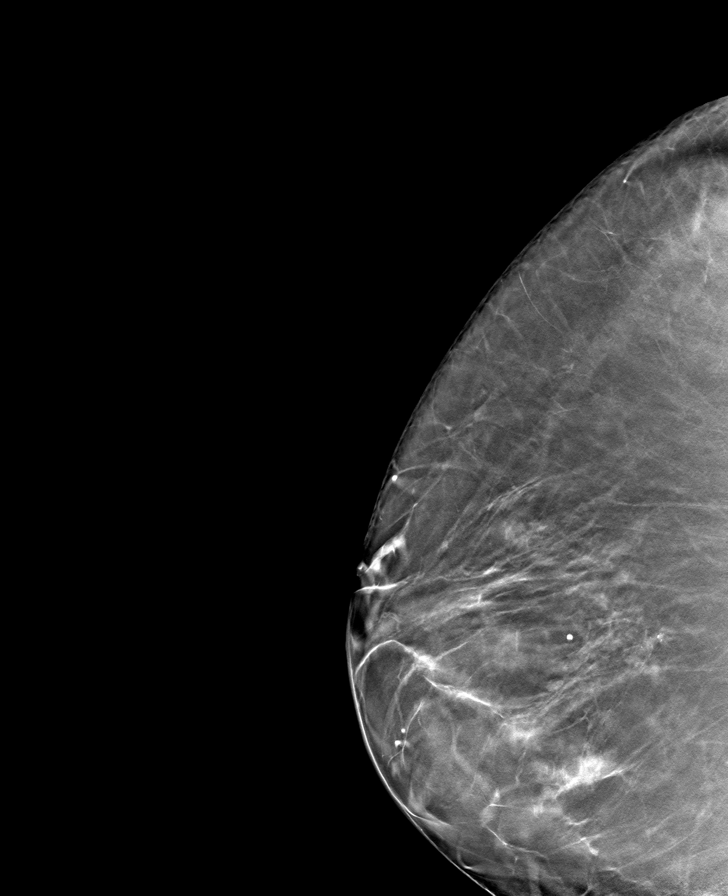

[R CC tomo (2 of 2) · tomo slice 41/81.0]
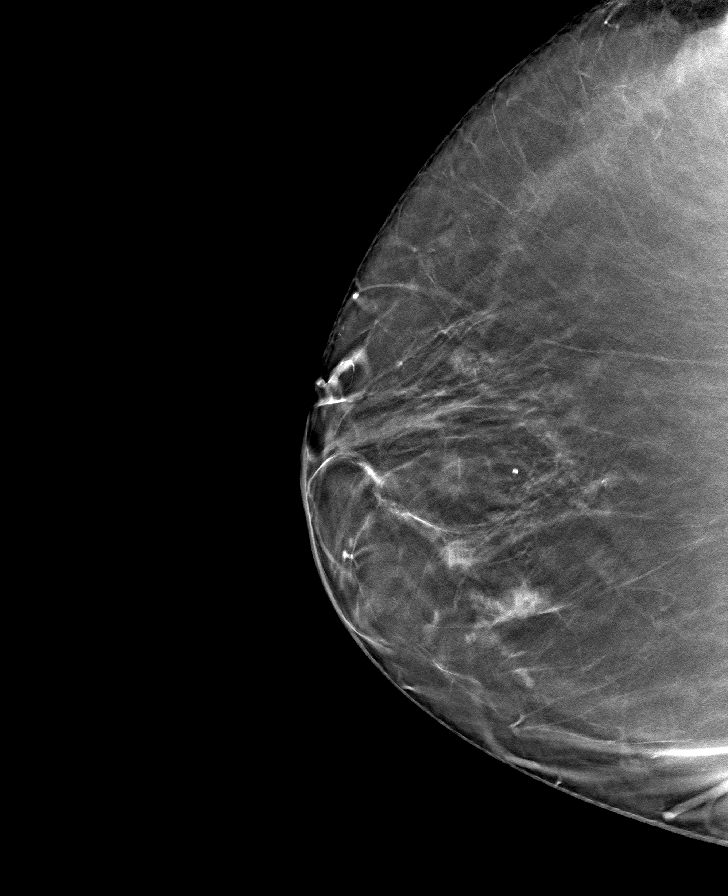

[R MLO tomo (1 of 2) · tomo slice 51/101.0]
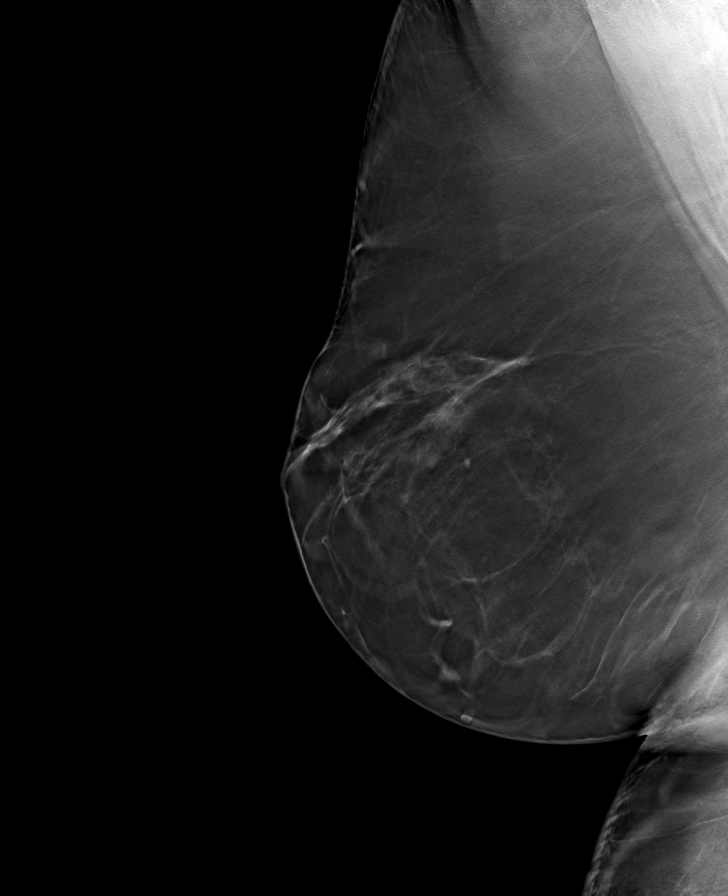

[R MLO tomo (2 of 2) · tomo slice 49/98.0]
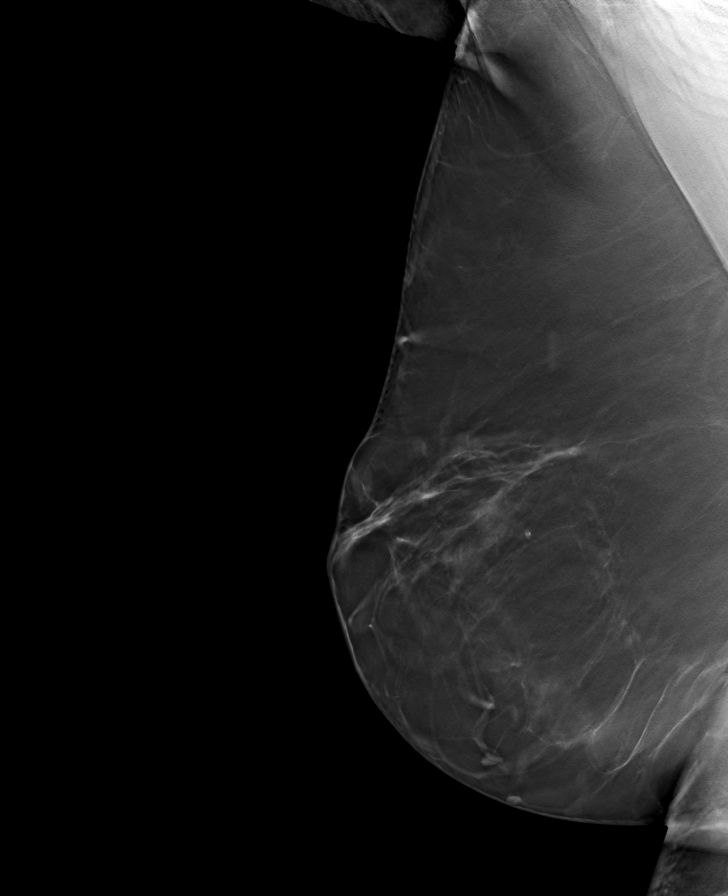

[8 of 24 positions shown; findings below may reference images not displayed]

ACR Breast Density Category b: There are scattered areas of
fibroglandular density.
FINDINGS: In the right breast, calcifications warrant further evaluation with
magnified views. Images were processed with CAD.
IMPRESSION: Further evaluation is suggested for calcifications in the right
breast.

RECOMMENDATION:
Diagnostic mammogram of the right breast. (Code:[id number])

The patient will be contacted regarding the findings, and additional
imaging will be scheduled.

BI-RADS CATEGORY  0: Incomplete. Need additional imaging evaluation
and/or prior mammograms for comparison.

## 2020-08-23 DIAGNOSIS — N3941 Urge incontinence: Secondary | ICD-10-CM | POA: Diagnosis not present

## 2020-08-25 DIAGNOSIS — N3941 Urge incontinence: Secondary | ICD-10-CM | POA: Diagnosis not present

## 2020-08-26 IMAGING — MG DIGITAL DIAGNOSTIC UNILATERAL RIGHT MAMMOGRAM
3 series · 3 of 3 positions shown · non-contrast
Comparison: Previous exam(s).

CLINICAL DATA: Patient was called back from screening mammogram for
right breast calcifications. Patient has a history of a left
mastectomy and right reduction mammoplasty in 0287.

EXAM:
DIGITAL DIAGNOSTIC RIGHT MAMMOGRAM

[R ML (1 of 2)]
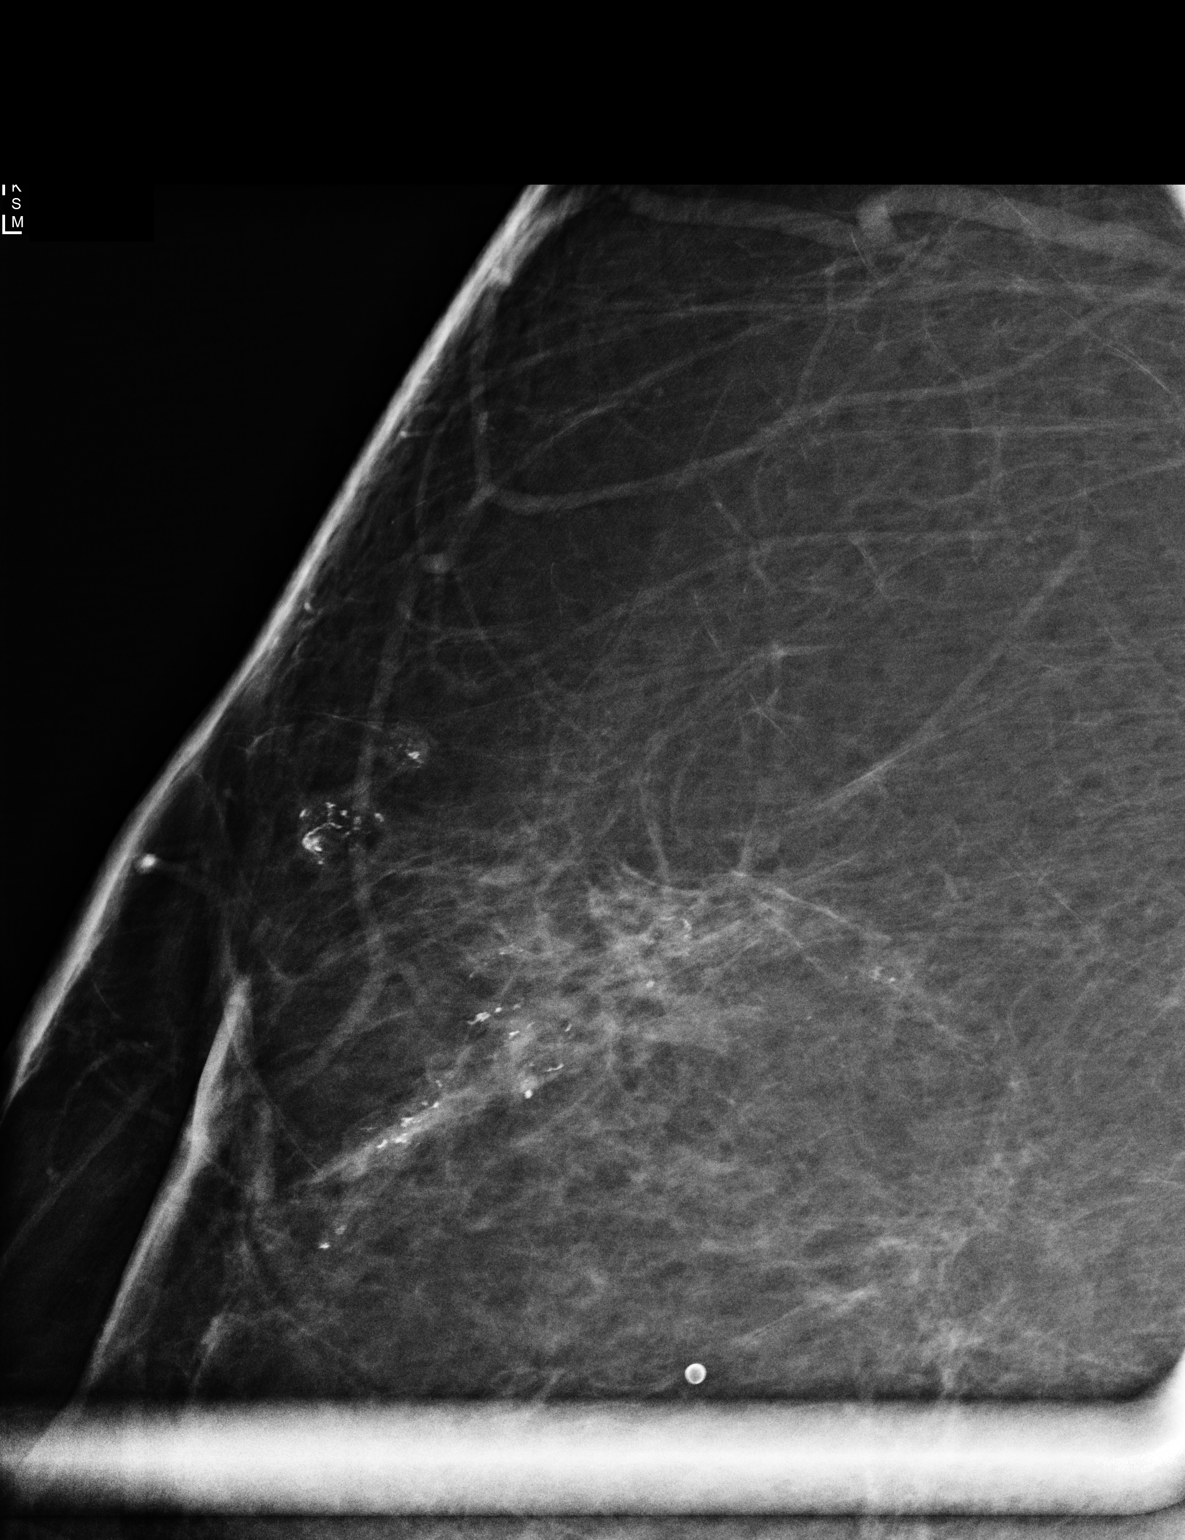

[R CC]
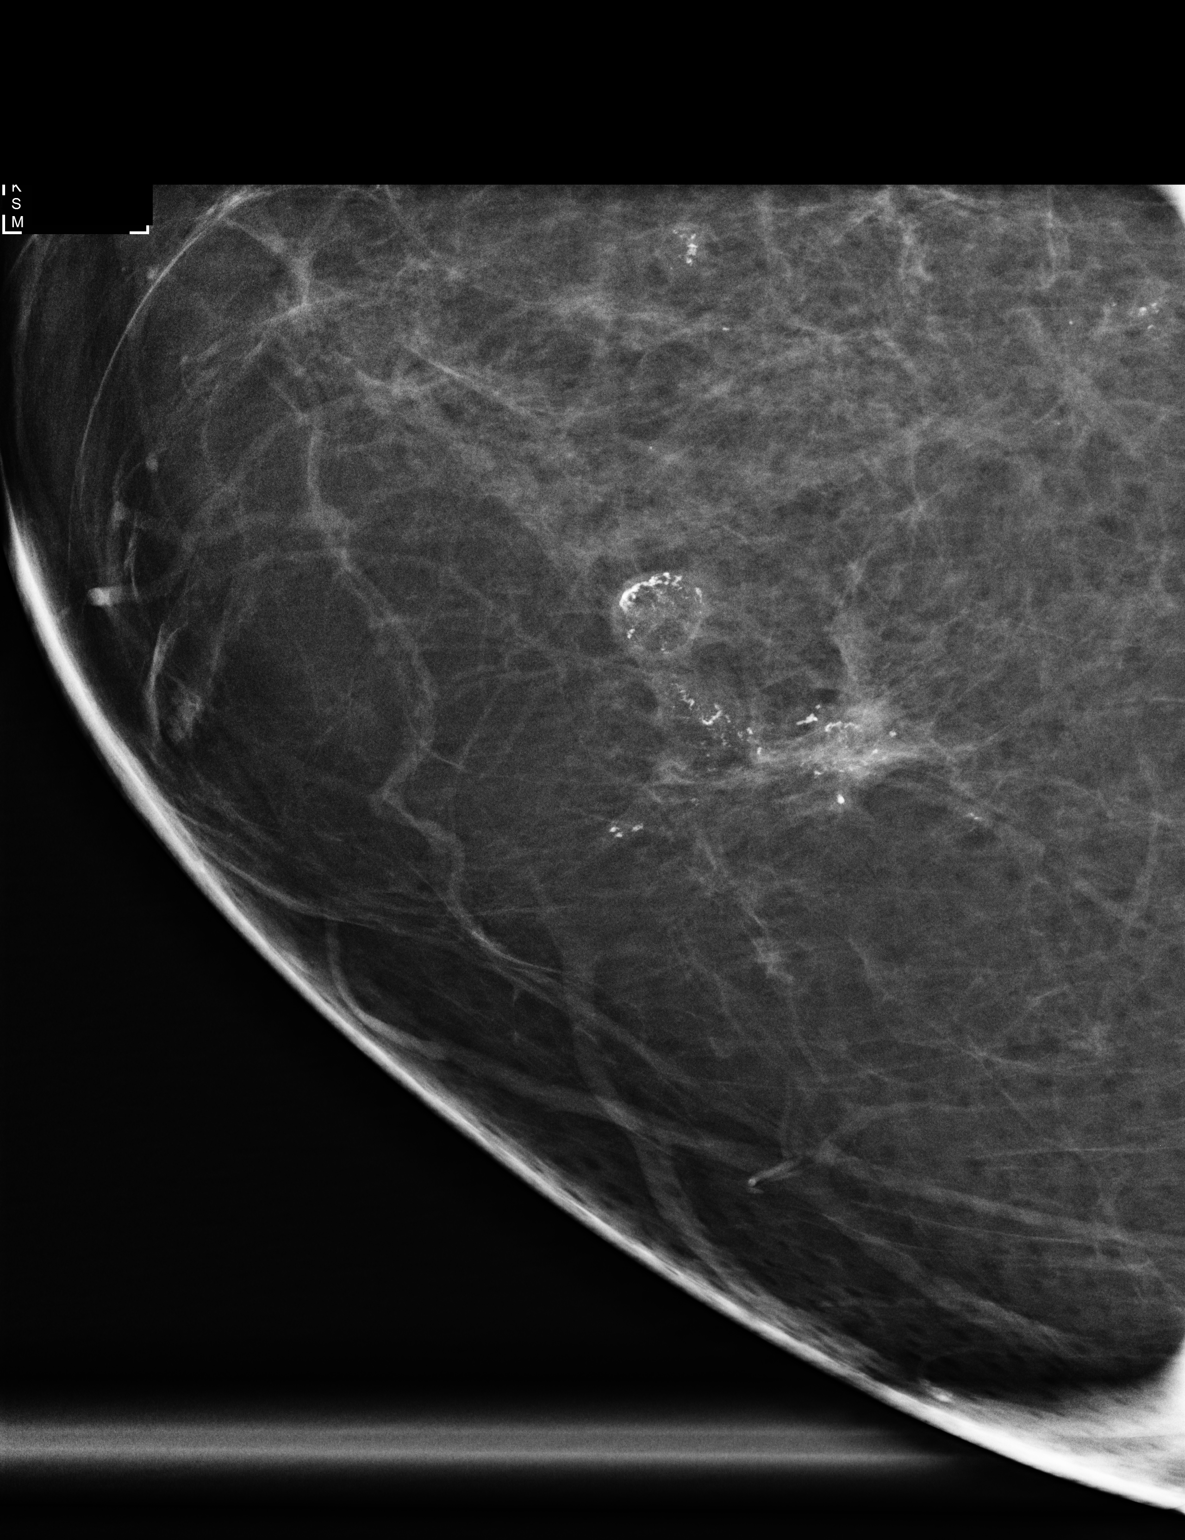

[R ML (2 of 2)]
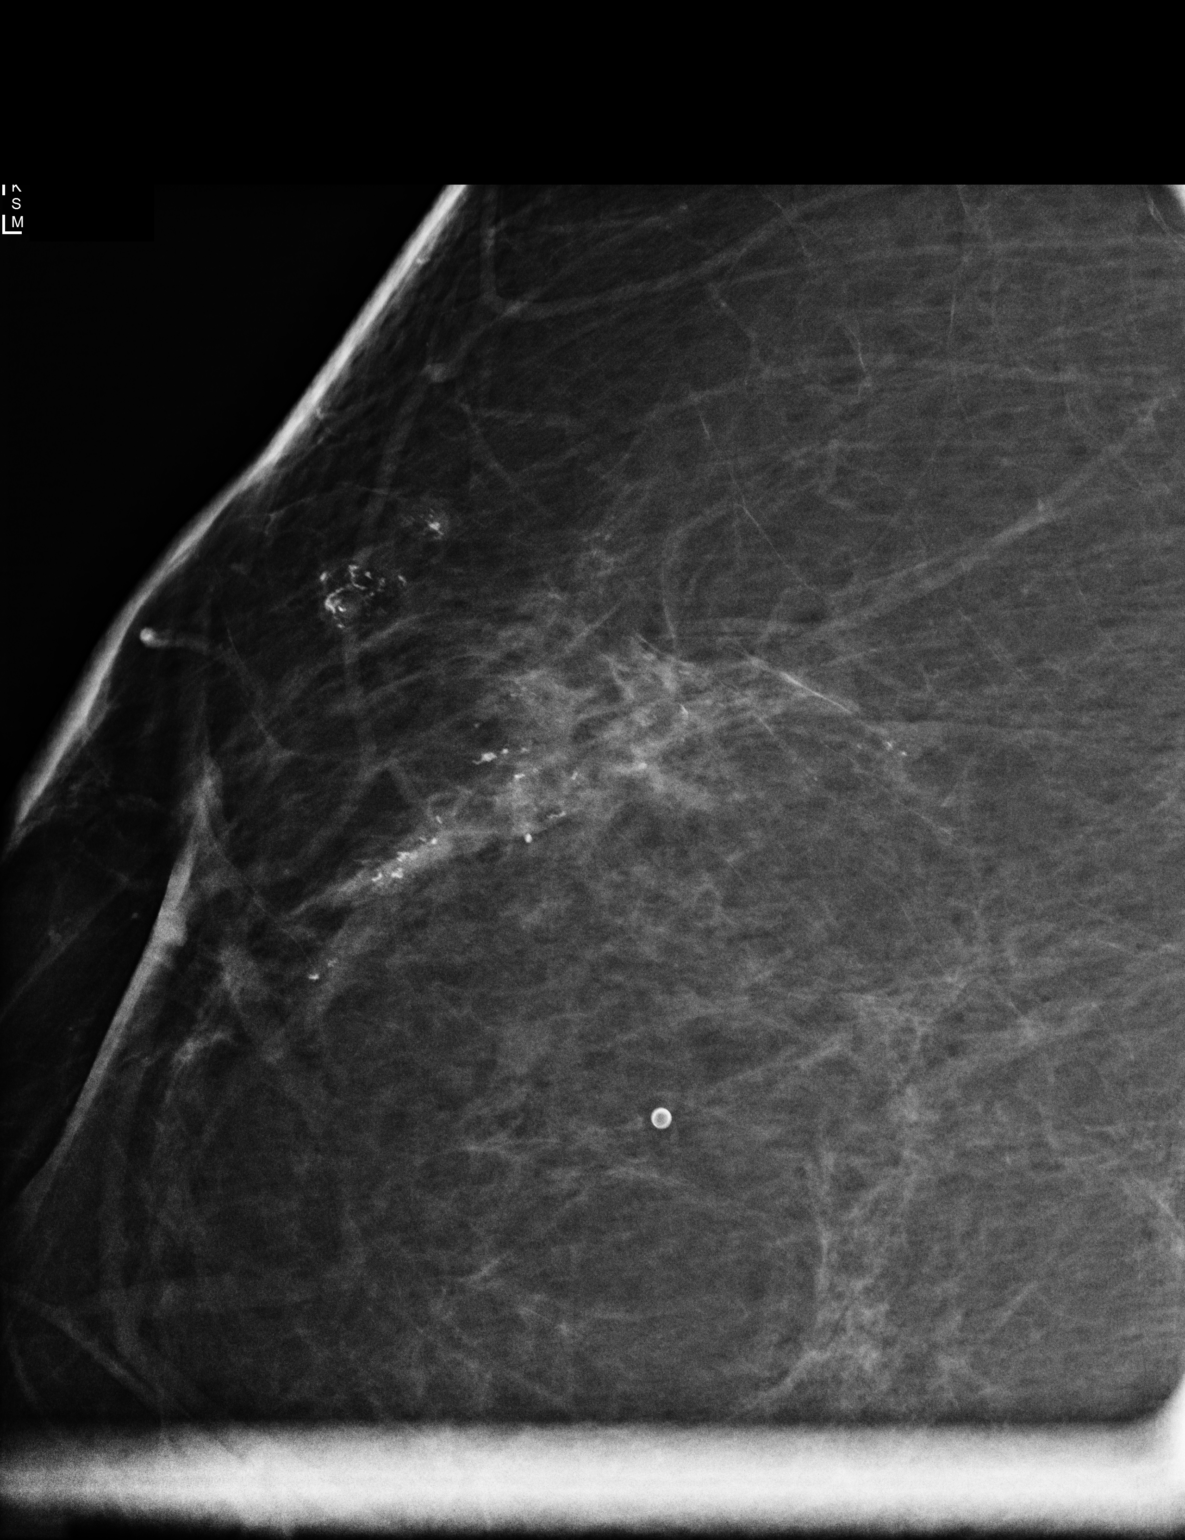

[3 of 3 positions shown; findings below may reference images not displayed]

ACR Breast Density Category b: There are scattered areas of
fibroglandular density.
FINDINGS: Additional imaging of the upper inner quadrant the right breast was
obtained. There are diffuse coarse calcifications that have a benign
appearance and are thought to be secondary to fat necrosis.
IMPRESSION: Probable benign calcifications in the right breast.

RECOMMENDATION:
Short-term interval follow-up right mammogram in 6 months is
recommended.

I have discussed the findings and recommendations with the patient.
Results were also provided in writing at the conclusion of the
visit. If applicable, a reminder letter will be sent to the patient
regarding the next appointment.

BI-RADS CATEGORY  3: Probably benign.

## 2020-08-30 DIAGNOSIS — N3941 Urge incontinence: Secondary | ICD-10-CM | POA: Diagnosis not present

## 2020-09-01 DIAGNOSIS — N3941 Urge incontinence: Secondary | ICD-10-CM | POA: Diagnosis not present

## 2020-09-13 ENCOUNTER — Other Ambulatory Visit: Payer: Self-pay | Admitting: Oncology

## 2020-09-15 ENCOUNTER — Other Ambulatory Visit: Payer: Self-pay | Admitting: *Deleted

## 2020-09-15 MED ORDER — VENLAFAXINE HCL ER 75 MG PO CP24: 75.0000 mg | ORAL_CAPSULE | Freq: Two times a day (BID) | ORAL | 0 refills | Status: DC

## 2020-09-26 ENCOUNTER — Encounter (INDEPENDENT_AMBULATORY_CARE_PROVIDER_SITE_OTHER): Payer: Medicare Other | Admitting: Ophthalmology

## 2020-09-26 DIAGNOSIS — Z5181 Encounter for therapeutic drug level monitoring: Secondary | ICD-10-CM | POA: Diagnosis not present

## 2020-09-26 DIAGNOSIS — G894 Chronic pain syndrome: Secondary | ICD-10-CM | POA: Diagnosis not present

## 2020-09-26 DIAGNOSIS — Z79899 Other long term (current) drug therapy: Secondary | ICD-10-CM | POA: Diagnosis not present

## 2020-09-27 ENCOUNTER — Encounter (INDEPENDENT_AMBULATORY_CARE_PROVIDER_SITE_OTHER): Payer: Self-pay | Admitting: Ophthalmology

## 2020-09-27 ENCOUNTER — Ambulatory Visit (INDEPENDENT_AMBULATORY_CARE_PROVIDER_SITE_OTHER): Payer: Medicare Other | Admitting: Ophthalmology

## 2020-09-27 ENCOUNTER — Other Ambulatory Visit: Payer: Self-pay

## 2020-09-27 DIAGNOSIS — H43822 Vitreomacular adhesion, left eye: Secondary | ICD-10-CM

## 2020-09-27 DIAGNOSIS — H353212 Exudative age-related macular degeneration, right eye, with inactive choroidal neovascularization: Secondary | ICD-10-CM | POA: Diagnosis not present

## 2020-09-27 DIAGNOSIS — H353114 Nonexudative age-related macular degeneration, right eye, advanced atrophic with subfoveal involvement: Secondary | ICD-10-CM | POA: Insufficient documentation

## 2020-09-27 DIAGNOSIS — H353132 Nonexudative age-related macular degeneration, bilateral, intermediate dry stage: Secondary | ICD-10-CM | POA: Insufficient documentation

## 2020-09-27 DIAGNOSIS — H35722 Serous detachment of retinal pigment epithelium, left eye: Secondary | ICD-10-CM | POA: Diagnosis not present

## 2020-09-27 NOTE — Assessment & Plan Note (Signed)
Drusenoid subfoveal pigment epithelial detachment, no signs of vascularization we'll continue to monitor and observe

## 2020-09-27 NOTE — Assessment & Plan Note (Signed)
Resolved condition 

## 2020-09-27 NOTE — Patient Instructions (Signed)
Patient instructed to contact the office promptly for new onset visual acuity declines or distortion 

## 2020-09-27 NOTE — Progress Notes (Signed)
09/27/2020     CHIEF COMPLAINT Patient presents for Retina Follow Up (6 Month f\u OU. OCT/Pt states NVA is not doing well. She has trouble seeing writing on the TV.)   HISTORY OF PRESENT ILLNESS: Debra Griffith is a 80 y.o. female who presents to the clinic today for:   HPI    Retina Follow Up    Diagnosis: Vitreomacular Adhesion.  In left eye.  Severity is moderate.  Duration of 6 months.  Since onset it is stable.  I, the attending physician,  performed the HPI with the patient and updated documentation appropriately. Additional comments: 6 Month f\u OU. OCT Pt states NVA is not doing well. She has trouble seeing writing on the TV.       Last edited by Tilda Franco on 09/27/2020  1:51 PM. (History)      Referring physician: Josetta Huddle, MD [address],  [place] [address]  HISTORICAL INFORMATION:   Selected notes from the [place]: No current outpatient medications on file. (Ophthalmic Drugs)   No current facility-administered medications for this visit. (Ophthalmic Drugs)   Current Outpatient Medications (Other)  Medication Sig  . amLODipine (NORVASC) 2.5 MG tablet Take 2.5 mg by mouth daily.  Marland Kitchen anastrozole (ARIMIDEX) 1 MG tablet Take 1 tablet by mouth once daily  . cholecalciferol (VITAMIN D) 1000 units tablet Take 1,000 Units by mouth daily.  . clonazePAM (KLONOPIN) 1 MG tablet Take 2 mg by mouth 2 (two) times daily.   . cyclobenzaprine (FLEXERIL) 10 MG tablet   . fluorouracil (EFUDEX) 5 % cream fluorouracil 5 % topical cream  . hydrALAZINE (APRESOLINE) 50 MG tablet   . HYDROcodone-acetaminophen (NORCO) 10-325 MG tablet   . MAGNESIUM PO Take 2 tablets by mouth daily.  . metoprolol tartrate (LOPRESSOR) 25 MG tablet Take 25 mg by mouth 2 (two) times daily.   . Misc Natural Products (TART CHERRY ADVANCED PO) Take 1 capsule by mouth daily.  . nitrofurantoin (MACRODANTIN) 100 MG capsule Take 1 capsule  (100 mg total) by mouth at bedtime.  . Omega-3 Fatty Acids (FISH OIL) 1000 MG CAPS Take 1,000 mg by mouth daily.  Marland Kitchen oxybutynin (DITROPAN-XL) 10 MG 24 hr tablet   . Saccharomyces boulardii (PROBIOTIC) 250 MG CAPS Take by mouth.  . triamcinolone ointment (KENALOG) 0.5 % Apply 1 application topically 2 (two) times daily.  Marland Kitchen triamterene-hydrochlorothiazide (MAXZIDE-25) 37.5-25 MG tablet   . Turmeric 500 MG CAPS Take 500 mg by mouth daily.   . valsartan (DIOVAN) 320 MG tablet   . venlafaxine XR (EFFEXOR-XR) 75 MG 24 hr capsule Take 1 capsule (75 mg total) by mouth 2 (two) times daily.  . vitamin C (ASCORBIC ACID) 500 MG tablet Take 1,000 mg by mouth daily.  Marland Kitchen zolpidem (AMBIEN) 10 MG tablet zolpidem 10 mg tablet   No current facility-administered medications for this visit. (Other)   Facility-Administered Medications Ordered in Other Visits (Other)  Medication Route  . methocarbamol (ROBAXIN) tablet 500 mg Oral  . metroNIDAZOLE (FLAGYL) tablet 500 mg Oral  . ondansetron (ZOFRAN-ODT) disintegrating tablet 4 mg Oral   Or  . ondansetron (ZOFRAN) 4 mg in sodium chloride 0.9 % 50 mL IVPB Intravenous  . oxyCODONE (Oxy IR/ROXICODONE) immediate release tablet 5-10 mg Oral  . simethicone (MYLICON) chewable tablet 40 mg Oral      REVIEW OF SYSTEMS:    ALLERGIES Allergies  Allergen Reactions  .  Morphine And Related Other (See Comments)    hallucinations    PAST MEDICAL HISTORY Past Medical History:  Diagnosis Date  . Anemia    hx of   . Anxiety   . Breast cancer in female [facility]) 03/2016   left  . Chronic bronchitis ([place])    FLARE UPS USUALLY ONCE A YEAR  . Chronic lower back pain   . Complication of anesthesia    trouble waking up   . CRI (chronic renal insufficiency) 07/27/2016  . DDD (degenerative disc disease), lumbar   . Depression   . GERD (gastroesophageal reflux disease)   . History of recent fall    "twice on Sunday; once yesterday" (08/29/2016)  . Hypertension   .  Inflammatory arthritis 07/27/2016   Sero Negative, Positive Synovitis hands, [place]  . Insomnia   . Irregular heart beats   . Macular degeneration of right eye   . New onset atrial fibrillation ([place])   . OA (osteoarthritis) of hip 06/24/2012  . OA (osteoarthritis) of knee 11/02/2013   L knee   . Obesity   . Osteoarthritis of both feet 07/27/2016  . Osteoarthritis of both hands 07/27/2016  . PONV (postoperative nausea and vomiting)    SEVERE N&V AND DIARRHEA AFTER HYSTERECTOMY AND AFTER WISDOM TEETH EXTRACTIONS - NO PROBLEMS WITH LAST 2 SURGERIES - THE HIP AND LEFT KNEE  . Postop Acute blood loss anemia 06/25/2012  . Postop Hyponatremia 06/25/2012  . Postop Sinus tachycardia 06/27/2012  . RBBB (right bundle branch block) 07/27/2016  . Rheumatoid arthritis [facility])    "doctor recently took me off all RX for this" (08/02/2016)  . Right bundle branch block   . Right bundle branch block   . S/p dental crown    dental crowns on every tooth  . Status post total bilateral knee replacement 11/13/2013  . Vitamin D deficiency    Past Surgical History:  Procedure Laterality Date  . AXILLARY SURGERY Left 06/25/2016   Aspiration of left axillary seroma   . BREAST BIOPSY Left 03/2016  . BREAST LUMPECTOMY WITH RADIOACTIVE SEED AND SENTINEL LYMPH NODE BIOPSY Left 06/13/2016   Procedure: LEFT BREAST LUMPECTOMY WITH BRACKETED  RADIOACTIVE SEED AND SENTINEL LYMPH NODE BIOPSY;  Surgeon: Rolm Bookbinder, MD;  Location: [place];  Service: General;  Laterality: Left;  . BREAST RECONSTRUCTION Bilateral 06/25/2016   BILATERAL ONCOPLASTIC BREAST RECONSTRUCTION WITH BREAST REDUCTION  . BREAST RECONSTRUCTION WITH PLACEMENT OF TISSUE EXPANDER AND FLEX HD (ACELLULAR HYDRATED DERMIS) Bilateral 06/25/2016   Procedure: BILATERAL ONCOPLASTIC BREAST RECONSTRUCTION WITH BREAST REDUCTION, Aspiration of left axillary seroma;  Surgeon: Irene Limbo, MD;  Location: [place];  Service: Plastics;  Laterality: Bilateral;  . BREAST  REDUCTION SURGERY Bilateral 06/25/2016   Procedure: MAMMARY REDUCTION  (BREAST) BILATERAL;  Surgeon: Irene Limbo, MD;  Location: [place];  Service: Plastics;  Laterality: Bilateral;  . CESAREAN SECTION  1966; 1971; 1973  . COLONOSCOPY    . DEBRIDEMENT AND CLOSURE WOUND Right 07/11/2016   Procedure: DEBRIDEMENT LEFT BREAST;  Surgeon: Irene Limbo, MD;  Location: [facility];  Service: Plastics;  Laterality: Right;  . JOINT REPLACEMENT    . KNEE ARTHROSCOPY Right    "before replacement"  . MASTECTOMY COMPLETE / SIMPLE Left 08/02/2016   total  . REDUCTION MAMMAPLASTY Bilateral 06/25/2016  . TOTAL ABDOMINAL HYSTERECTOMY W/ BILATERAL SALPINGOOPHORECTOMY    . TOTAL HIP ARTHROPLASTY  06/24/2012   Procedure: TOTAL HIP ARTHROPLASTY;  Surgeon: [name], MD;  Location: [facility];  Service: Orthopedics;  Laterality: Left;  . TOTAL KNEE ARTHROPLASTY N/A 11/02/2013   Procedure: LEFT TOTAL KNEE ARTHROPLASTY WITH RIGHT KNEE CORTISONE INJECTION;  Surgeon: [name], MD;  Location: [facility];  Service: Orthopedics;  Laterality: N/A;  . TOTAL KNEE ARTHROPLASTY Right 05/31/2014   Procedure: RIGHT TOTAL KNEE ARTHROPLASTY;  Surgeon: [name], MD;  Location: [facility];  Service: Orthopedics;  Laterality: Right;  . TOTAL MASTECTOMY Left 08/02/2016   Procedure: LEFT TOTAL MASTECTOMY;  Surgeon: Rolm Bookbinder, MD;  Location: [place];  Service: General;  Laterality: Left;  . TUBAL LIGATION  1973  . WISDOM TOOTH EXTRACTION  1970's   admitted to hospital for surgery    FAMILY HISTORY Family History  Problem Relation Age of Onset  . Hypertension Mother   . Prostate cancer Father   . Hypertension Father   . Heart Problems Father        quadruple bypass  . Hypertension Sister     SOCIAL HISTORY Social History   Tobacco Use  . Smoking status: Never Smoker  . Smokeless tobacco: Never Used  Substance Use Topics  . Alcohol use: No  . Drug use: Yes    Types: Other-see comments    Comment: CBD oil          OPHTHALMIC EXAM:  Base Eye Exam    Visual Acuity (Snellen - Linear)      Right Left   Dist [place] 20/60 -1 20/25 -2   Dist ph [place] 20/50 -1        Tonometry (Tonopen, 2:00 PM)      Right Left   Pressure 15 17       Pupils      Pupils Dark Light Shape React APD   Right PERRL 5 4 Round Brisk None   Left PERRL 5 4 Round Brisk None       Visual Fields (Counting fingers)      Left Right    Full Full       Neuro/Psych    Oriented x3: Yes   Mood/Affect: Normal       Dilation    Both eyes: 1.0% Mydriacyl, 2.5% Phenylephrine @ 2:00 PM        Slit Lamp and Fundus Exam    External Exam      Right Left   External Normal Normal       Slit Lamp Exam      Right Left   Lids/Lashes Normal Normal   Conjunctiva/Sclera White and quiet White and quiet   Cornea Clear Clear   Anterior Chamber Deep and quiet Deep and quiet   Iris Round and reactive Round and reactive   Lens Centered posterior chamber intraocular lens Centered posterior chamber intraocular lens   Anterior Vitreous Normal Normal       Fundus Exam      Right Left   Posterior Vitreous Vitrectomized, clear Vitrectomized, clear   Disc Normal Normal   C/D Ratio 0.05 0.05   Macula Retinal pigment epithelial mottling, no macular thickening, Hard drusen, Geographic atrophy Retinal pigment epithelial mottling, no macular thickening, Hard drusen, Geographic atrophy   Vessels Normal Normal   Periphery Normal Normal          IMAGING AND PROCEDURES  Imaging and Procedures for 09/27/20  OCT, Retina - OU - Both Eyes       Right Eye Quality was good. Scan locations included subfoveal. Central Foveal Thickness: 271. Progression has been stable. Findings include abnormal foveal contour, retinal drusen .  Left Eye Quality was good. Scan locations included subfoveal. Central Foveal Thickness: 318. Progression has been stable. Findings include abnormal foveal contour, retinal drusen , no IRF.   Notes Inner  foveal macular schisis OD, with outer foveal atrophy as there is result of prior large subfoveal drusenoid vitelliform deposit now resolved post vitrectomy and release of vitreomacular traction.  This condition stable.  No active CN VM.  OS large subfoveal pigment epithelial detachment type drusenoid deposit, no signs of CN VM, will continue to monitor and observe.                ASSESSMENT/PLAN:  Exudative age-related macular degeneration of right eye with inactive choroidal neovascularization (HCC) No signs of active CNVM.  Serous detachment of retinal pigment epithelium of left eye Drusenoid subfoveal pigment epithelial detachment, no signs of vascularization we'll continue to monitor and observe  Vitreomacular adhesion of left eye Resolved condition  Intermediate stage nonexudative age-related macular degeneration of both eyes No active CN VM OU.  Visual acuity stable.  Potential complicating factors of OSA have been ruled out, see sleep study results August 2021 no sleep apnea      ICD-10-CM   1. Intermediate stage nonexudative age-related macular degeneration of both eyes  H35.3132   2. Vitreomacular adhesion of left eye  H43.822 OCT, Retina - OU - Both Eyes  3. Exudative age-related macular degeneration of right eye with inactive choroidal neovascularization ([place])  H35.3212   4. Serous detachment of retinal pigment epithelium of left eye  H35.722     1.  No signs of CNVM OU.  We'll continue to monitor and observe.  2.  3.  Ophthalmic Meds Ordered this visit:  No orders of the defined types were placed in this encounter.      Return in about 6 months (around 03/27/2021) for DILATE OU, OCT.  Patient Instructions  Patient instructed to contact the office promptly for new onset visual acuity declines or distortion    Explained the diagnoses, plan, and follow up with the patient and they expressed understanding.  Patient expressed understanding of the  importance of proper follow up care.   Clent Demark Kalia Vahey M.D. Diseases & Surgery of the Retina and Vitreous [facility] [place] 09/27/20     Abbreviations: M myopia (nearsighted); A astigmatism; H hyperopia (farsighted); P presbyopia; Mrx spectacle prescription;  CTL contact lenses; OD right eye; OS left eye; OU both eyes  XT exotropia; ET esotropia; PEK punctate epithelial keratitis; PEE punctate epithelial erosions; DES dry eye syndrome; MGD meibomian gland dysfunction; ATs artificial tears; PFAT's preservative free artificial tears; [place] nuclear sclerotic cataract; PSC posterior subcapsular cataract; ERM epi-retinal membrane; PVD posterior vitreous detachment; RD retinal detachment; DM diabetes mellitus; DR diabetic retinopathy; NPDR non-proliferative diabetic retinopathy; PDR proliferative diabetic retinopathy; CSME clinically significant macular edema; DME diabetic macular edema; dbh dot blot hemorrhages; CWS cotton wool spot; POAG primary open angle glaucoma; C/D cup-to-disc ratio; HVF humphrey visual field; GVF goldmann visual field; OCT optical coherence tomography; IOP intraocular pressure; BRVO Branch retinal vein occlusion; CRVO central retinal vein occlusion; CRAO central retinal artery occlusion; BRAO branch retinal artery occlusion; RT retinal tear; SB scleral buckle; PPV pars plana vitrectomy; VH Vitreous hemorrhage; PRP panretinal laser photocoagulation; IVK intravitreal kenalog; VMT vitreomacular traction; MH Macular hole;  NVD neovascularization of the disc; NVE neovascularization elsewhere; AREDS age related eye disease study; ARMD age related macular degeneration; POAG primary open angle glaucoma; EBMD epithelial/anterior basement membrane dystrophy; ACIOL anterior chamber intraocular lens;  IOL intraocular lens; PCIOL posterior chamber intraocular lens; Phaco/IOL phacoemulsification with intraocular lens placement; [place] photorefractive keratectomy; LASIK laser assisted in situ  keratomileusis; HTN hypertension; DM diabetes mellitus; COPD chronic obstructive pulmonary disease

## 2020-09-27 NOTE — Assessment & Plan Note (Signed)
No signs of active CNVM 

## 2020-09-27 NOTE — Assessment & Plan Note (Signed)
No active CN VM OU.  Visual acuity stable.  Potential complicating factors of OSA have been ruled out, see sleep study results August 2021 no sleep apnea

## 2020-09-29 DIAGNOSIS — R350 Frequency of micturition: Secondary | ICD-10-CM | POA: Diagnosis not present

## 2020-10-17 DIAGNOSIS — G4700 Insomnia, unspecified: Secondary | ICD-10-CM | POA: Diagnosis not present

## 2020-10-17 DIAGNOSIS — D6869 Other thrombophilia: Secondary | ICD-10-CM | POA: Diagnosis not present

## 2020-10-17 DIAGNOSIS — G4733 Obstructive sleep apnea (adult) (pediatric): Secondary | ICD-10-CM | POA: Diagnosis not present

## 2020-10-17 DIAGNOSIS — N390 Urinary tract infection, site not specified: Secondary | ICD-10-CM | POA: Diagnosis not present

## 2020-10-17 DIAGNOSIS — E782 Mixed hyperlipidemia: Secondary | ICD-10-CM | POA: Diagnosis not present

## 2020-10-17 DIAGNOSIS — R251 Tremor, unspecified: Secondary | ICD-10-CM | POA: Diagnosis not present

## 2020-10-17 DIAGNOSIS — Z8679 Personal history of other diseases of the circulatory system: Secondary | ICD-10-CM | POA: Diagnosis not present

## 2020-10-27 DIAGNOSIS — R350 Frequency of micturition: Secondary | ICD-10-CM | POA: Diagnosis not present

## 2020-11-19 ENCOUNTER — Other Ambulatory Visit: Payer: Self-pay | Admitting: Oncology

## 2020-11-22 ENCOUNTER — Other Ambulatory Visit: Payer: Self-pay

## 2020-11-22 MED ORDER — ANASTROZOLE 1 MG PO TABS: 1.0000 mg | ORAL_TABLET | Freq: Every day | ORAL | 0 refills | Status: DC

## 2020-11-24 DIAGNOSIS — R350 Frequency of micturition: Secondary | ICD-10-CM | POA: Diagnosis not present

## 2021-01-26 DIAGNOSIS — R350 Frequency of micturition: Secondary | ICD-10-CM | POA: Diagnosis not present

## 2021-01-26 DIAGNOSIS — G894 Chronic pain syndrome: Secondary | ICD-10-CM | POA: Diagnosis not present

## 2021-02-15 ENCOUNTER — Other Ambulatory Visit: Payer: Self-pay | Admitting: Oncology

## 2021-02-23 DIAGNOSIS — R350 Frequency of micturition: Secondary | ICD-10-CM | POA: Diagnosis not present

## 2021-03-28 ENCOUNTER — Encounter (INDEPENDENT_AMBULATORY_CARE_PROVIDER_SITE_OTHER): Payer: Medicare Other | Admitting: Ophthalmology

## 2021-03-30 DIAGNOSIS — N3020 Other chronic cystitis without hematuria: Secondary | ICD-10-CM | POA: Diagnosis not present

## 2021-04-05 DIAGNOSIS — R251 Tremor, unspecified: Secondary | ICD-10-CM | POA: Diagnosis not present

## 2021-04-05 DIAGNOSIS — Z853 Personal history of malignant neoplasm of breast: Secondary | ICD-10-CM | POA: Diagnosis not present

## 2021-04-05 DIAGNOSIS — E782 Mixed hyperlipidemia: Secondary | ICD-10-CM | POA: Diagnosis not present

## 2021-04-05 DIAGNOSIS — G4733 Obstructive sleep apnea (adult) (pediatric): Secondary | ICD-10-CM | POA: Diagnosis not present

## 2021-04-05 DIAGNOSIS — Z0001 Encounter for general adult medical examination with abnormal findings: Secondary | ICD-10-CM | POA: Diagnosis not present

## 2021-04-05 DIAGNOSIS — N390 Urinary tract infection, site not specified: Secondary | ICD-10-CM | POA: Diagnosis not present

## 2021-04-05 DIAGNOSIS — G4700 Insomnia, unspecified: Secondary | ICD-10-CM | POA: Diagnosis not present

## 2021-04-10 DIAGNOSIS — E559 Vitamin D deficiency, unspecified: Secondary | ICD-10-CM | POA: Diagnosis not present

## 2021-04-10 DIAGNOSIS — I10 Essential (primary) hypertension: Secondary | ICD-10-CM | POA: Diagnosis not present

## 2021-04-10 DIAGNOSIS — E782 Mixed hyperlipidemia: Secondary | ICD-10-CM | POA: Diagnosis not present

## 2021-04-10 DIAGNOSIS — N390 Urinary tract infection, site not specified: Secondary | ICD-10-CM | POA: Diagnosis not present

## 2021-04-10 DIAGNOSIS — E538 Deficiency of other specified B group vitamins: Secondary | ICD-10-CM | POA: Diagnosis not present

## 2021-04-24 ENCOUNTER — Other Ambulatory Visit: Payer: Self-pay | Admitting: Oncology

## 2021-04-24 DIAGNOSIS — Z1231 Encounter for screening mammogram for malignant neoplasm of breast: Secondary | ICD-10-CM

## 2021-05-01 ENCOUNTER — Encounter (INDEPENDENT_AMBULATORY_CARE_PROVIDER_SITE_OTHER): Payer: Medicare Other | Admitting: Ophthalmology

## 2021-05-02 ENCOUNTER — Ambulatory Visit
Admission: RE | Admit: 2021-05-02 | Discharge: 2021-05-02 | Disposition: A | Discharge status: Home / Self Care | Payer: Medicare Other | Source: Ambulatory Visit | Attending: Oncology | Admitting: Oncology

## 2021-05-02 ENCOUNTER — Other Ambulatory Visit: Payer: Self-pay

## 2021-05-02 DIAGNOSIS — Z1231 Encounter for screening mammogram for malignant neoplasm of breast: Secondary | ICD-10-CM | POA: Diagnosis not present

## 2021-05-24 DIAGNOSIS — R350 Frequency of micturition: Secondary | ICD-10-CM | POA: Diagnosis not present

## 2021-05-30 DIAGNOSIS — M5459 Other low back pain: Secondary | ICD-10-CM | POA: Diagnosis not present

## 2021-06-01 ENCOUNTER — Ambulatory Visit (INDEPENDENT_AMBULATORY_CARE_PROVIDER_SITE_OTHER): Payer: Medicare Other | Admitting: Ophthalmology

## 2021-06-01 ENCOUNTER — Encounter (INDEPENDENT_AMBULATORY_CARE_PROVIDER_SITE_OTHER): Payer: Self-pay | Admitting: Ophthalmology

## 2021-06-01 ENCOUNTER — Other Ambulatory Visit: Payer: Self-pay

## 2021-06-01 DIAGNOSIS — H43822 Vitreomacular adhesion, left eye: Secondary | ICD-10-CM | POA: Diagnosis not present

## 2021-06-01 DIAGNOSIS — H353132 Nonexudative age-related macular degeneration, bilateral, intermediate dry stage: Secondary | ICD-10-CM

## 2021-06-01 DIAGNOSIS — H35722 Serous detachment of retinal pigment epithelium, left eye: Secondary | ICD-10-CM

## 2021-06-01 DIAGNOSIS — H353114 Nonexudative age-related macular degeneration, right eye, advanced atrophic with subfoveal involvement: Secondary | ICD-10-CM

## 2021-06-01 NOTE — Progress Notes (Signed)
06/01/2021     CHIEF COMPLAINT Patient presents for  Chief Complaint  Patient presents with   Retina Follow Up      HISTORY OF PRESENT ILLNESS: Debra Griffith is a 80 y.o. female who presents to the clinic today for:   HPI     Retina Follow Up   Patient presents with  Dry AMD.  In both eyes.  This started 8 months ago.  Duration of 8 months.        Comments   8 month f/u OU with OCT  Pt c/o diplopia occurring maybe once per day, she will close her eyes for some time and upon opening thee diplopia has resolved. Pt denies any new floaters or flashes.  Pt denies any eye pain.  Eye Meds: None      Last edited by Reather Littler, COA on 06/01/2021  2:39 PM.      Referring physician: Josetta Huddle, MD [address],  [place] [address]  HISTORICAL INFORMATION:   Selected notes from the [facility]: No current outpatient medications on file. (Ophthalmic Drugs)   No current facility-administered medications for this visit. (Ophthalmic Drugs)   Current Outpatient Medications (Other)  Medication Sig   amLODipine (NORVASC) 2.5 MG tablet Take 2.5 mg by mouth daily.   anastrozole (ARIMIDEX) 1 MG tablet Take 1 tablet by mouth once daily   cholecalciferol (VITAMIN D) 1000 units tablet Take 1,000 Units by mouth daily.   clonazePAM (KLONOPIN) 1 MG tablet Take 2 mg by mouth 2 (two) times daily.    cyclobenzaprine (FLEXERIL) 10 MG tablet    fluorouracil (EFUDEX) 5 % cream fluorouracil 5 % topical cream   hydrALAZINE (APRESOLINE) 50 MG tablet    HYDROcodone-acetaminophen (NORCO) 10-325 MG tablet    MAGNESIUM PO Take 2 tablets by mouth daily.   metoprolol tartrate (LOPRESSOR) 25 MG tablet Take 25 mg by mouth 2 (two) times daily.    Misc Natural Products (TART CHERRY ADVANCED PO) Take 1 capsule by mouth daily.   nitrofurantoin (MACRODANTIN) 100 MG capsule Take 1 capsule (100 mg total) by mouth at bedtime.   Omega-3  Fatty Acids (FISH OIL) 1000 MG CAPS Take 1,000 mg by mouth daily.   oxybutynin (DITROPAN-XL) 10 MG 24 hr tablet    Saccharomyces boulardii (PROBIOTIC) 250 MG CAPS Take by mouth.   triamcinolone ointment (KENALOG) 0.5 % Apply 1 application topically 2 (two) times daily.   triamterene-hydrochlorothiazide (MAXZIDE-25) 37.5-25 MG tablet    Turmeric 500 MG CAPS Take 500 mg by mouth daily.    valsartan (DIOVAN) 320 MG tablet    venlafaxine XR (EFFEXOR-XR) 75 MG 24 hr capsule Take 1 capsule (75 mg total) by mouth 2 (two) times daily.   vitamin C (ASCORBIC ACID) 500 MG tablet Take 1,000 mg by mouth daily.   zolpidem (AMBIEN) 10 MG tablet zolpidem 10 mg tablet   No current facility-administered medications for this visit. (Other)   Facility-Administered Medications Ordered in Other Visits (Other)  Medication Route   methocarbamol (ROBAXIN) tablet 500 mg Oral   metroNIDAZOLE (FLAGYL) tablet 500 mg Oral   ondansetron (ZOFRAN-ODT) disintegrating tablet 4 mg Oral   Or   ondansetron (ZOFRAN) 4 mg in sodium chloride 0.9 % 50 mL IVPB Intravenous   oxyCODONE (Oxy IR/ROXICODONE) immediate release tablet 5-10 mg Oral   simethicone (MYLICON) chewable tablet 40 mg Oral      REVIEW OF SYSTEMS:  ALLERGIES Allergies  Allergen Reactions   Morphine And Related Other (See Comments)    hallucinations    PAST MEDICAL HISTORY Past Medical History:  Diagnosis Date   Anemia    hx of    Anxiety    Breast cancer in female ([place]) 03/2016   left   Chronic bronchitis ([place])    FLARE UPS USUALLY ONCE A YEAR   Chronic lower back pain    Complication of anesthesia    trouble waking up    CRI (chronic renal insufficiency) 07/27/2016   DDD (degenerative disc disease), lumbar    Depression    GERD (gastroesophageal reflux disease)    History of recent fall    "twice on Sunday; once yesterday" (08/29/2016)   Hypertension    Inflammatory arthritis 07/27/2016   Sero Negative, Positive Synovitis hands, WJ    Insomnia    Irregular heart beats    Macular degeneration of right eye    New onset atrial fibrillation (HCC)    OA (osteoarthritis) of hip 06/24/2012   OA (osteoarthritis) of knee 11/02/2013   L knee    Obesity    Osteoarthritis of both feet 07/27/2016   Osteoarthritis of both hands 07/27/2016   PONV (postoperative nausea and vomiting)    SEVERE N&V AND DIARRHEA AFTER HYSTERECTOMY AND AFTER WISDOM TEETH EXTRACTIONS - NO PROBLEMS WITH LAST 2 SURGERIES - THE HIP AND LEFT KNEE   Postop Acute blood loss anemia 06/25/2012   Postop Hyponatremia 06/25/2012   Postop Sinus tachycardia 06/27/2012   RBBB (right bundle branch block) 07/27/2016   Rheumatoid arthritis ([place])    "doctor recently took me off all RX for this" (08/02/2016)   Right bundle branch block    Right bundle branch block    S/p dental crown    dental crowns on every tooth   Status post total bilateral knee replacement 11/13/2013   Vitamin D deficiency    Past Surgical History:  Procedure Laterality Date   AXILLARY SURGERY Left 06/25/2016   Aspiration of left axillary seroma    BREAST BIOPSY Left 03/2016   BREAST LUMPECTOMY WITH RADIOACTIVE SEED AND SENTINEL LYMPH NODE BIOPSY Left 06/13/2016   Procedure: LEFT BREAST LUMPECTOMY WITH BRACKETED  RADIOACTIVE SEED AND SENTINEL LYMPH NODE BIOPSY;  Surgeon: Rolm Bookbinder, MD;  Location: [facility];  Service: General;  Laterality: Left;   BREAST RECONSTRUCTION Bilateral 06/25/2016   BILATERAL ONCOPLASTIC BREAST RECONSTRUCTION WITH BREAST REDUCTION   BREAST RECONSTRUCTION WITH PLACEMENT OF TISSUE EXPANDER AND FLEX HD (ACELLULAR HYDRATED DERMIS) Bilateral 06/25/2016   Procedure: BILATERAL ONCOPLASTIC BREAST RECONSTRUCTION WITH BREAST REDUCTION, Aspiration of left axillary seroma;  Surgeon: Irene Limbo, MD;  Location: [facility];  Service: Plastics;  Laterality: Bilateral;   BREAST REDUCTION SURGERY Bilateral 06/25/2016   Procedure: MAMMARY REDUCTION  (BREAST) BILATERAL;  Surgeon: Irene Limbo, MD;  Location: [facility];  Service: Plastics;  Laterality: Bilateral;   CESAREAN SECTION  1966; 1971; 1973   COLONOSCOPY     DEBRIDEMENT AND CLOSURE WOUND Right 07/11/2016   Procedure: DEBRIDEMENT LEFT BREAST;  Surgeon: Irene Limbo, MD;  Location: [facility];  Service: Plastics;  Laterality: Right;   JOINT REPLACEMENT     KNEE ARTHROSCOPY Right    "before replacement"   MASTECTOMY COMPLETE / SIMPLE Left 08/02/2016   total   REDUCTION MAMMAPLASTY Bilateral 06/25/2016   TOTAL ABDOMINAL HYSTERECTOMY W/ BILATERAL SALPINGOOPHORECTOMY     TOTAL HIP ARTHROPLASTY  06/24/2012   Procedure: TOTAL HIP ARTHROPLASTY;  Surgeon: [name], MD;  Location: [facility];  Service: Orthopedics;  Laterality: Left;   TOTAL KNEE ARTHROPLASTY N/A 11/02/2013   Procedure: LEFT TOTAL KNEE ARTHROPLASTY WITH RIGHT KNEE CORTISONE INJECTION;  Surgeon: [name], MD;  Location: [facility];  Service: Orthopedics;  Laterality: N/A;   TOTAL KNEE ARTHROPLASTY Right 05/31/2014   Procedure: RIGHT TOTAL KNEE ARTHROPLASTY;  Surgeon: [name], MD;  Location: [facility];  Service: Orthopedics;  Laterality: Right;   TOTAL MASTECTOMY Left 08/02/2016   Procedure: LEFT TOTAL MASTECTOMY;  Surgeon: Rolm Bookbinder, MD;  Location: [facility];  Service: General;  Laterality: Left;   Gross EXTRACTION  1970's   admitted to hospital for surgery    FAMILY HISTORY Family History  Problem Relation Age of Onset   Hypertension Mother    Prostate cancer Father    Hypertension Father    Heart Problems Father        quadruple bypass   Hypertension Sister     SOCIAL HISTORY Social History   Tobacco Use   Smoking status: Never   Smokeless tobacco: Never  Substance Use Topics   Alcohol use: No   Drug use: Yes    Types: Other-see comments    Comment: CBD oil         OPHTHALMIC EXAM:  Base Eye Exam     Visual Acuity (ETDRS)       Right Left   Dist cc 20/70 -1 20/50 +2   Dist ph cc 20/60  -2 20/40 -2    Correction: Glasses         Tonometry (Tonopen, 2:51 PM)       Right Left   Pressure 15 14         Pupils       Pupils Dark Light Shape React APD   Right PERRL 5 4 Round Brisk None   Left PERRL 5 4 Round Brisk None         Visual Fields (Counting fingers)       Left Right    Full Full         Extraocular Movement       Right Left    Full, Ortho Full, Ortho         Neuro/Psych     Oriented x3: Yes   Mood/Affect: Normal         Dilation     Both eyes: 1.0% Mydriacyl, 2.5% Phenylephrine @ 2:51 PM           Slit Lamp and Fundus Exam     External Exam       Right Left   External Normal Normal         Slit Lamp Exam       Right Left   Lids/Lashes Normal Normal   Conjunctiva/Sclera White and quiet White and quiet   Cornea Clear Clear   Anterior Chamber Deep and quiet Deep and quiet   Iris Round and reactive Round and reactive   Lens Centered posterior chamber intraocular lens Centered posterior chamber intraocular lens   Anterior Vitreous Normal Normal         Fundus Exam       Right Left   Posterior Vitreous Vitrectomized, clear Vitrectomized, clear   Disc Normal Normal   C/D Ratio 0.05 0.05   Macula Retinal pigment epithelial mottling, no macular thickening, Hard drusen, Geographic atrophy Retinal pigment epithelial mottling, no macular thickening, Hard drusen, Geographic atrophy   Vessels Normal  Normal   Periphery Normal Normal            IMAGING AND PROCEDURES  Imaging and Procedures for 06/01/21  OCT, Retina - OU - Both Eyes       Right Eye Quality was good. Scan locations included subfoveal. Central Foveal Thickness: 278. Progression has been stable. Findings include abnormal foveal contour, retinal drusen .   Left Eye Quality was good. Scan locations included subfoveal. Central Foveal Thickness: 279. Progression has been stable. Findings include abnormal foveal contour, retinal drusen , no IRF.    Notes Inner foveal macular schisis OD, with outer foveal atrophy as there is result of prior large subfoveal drusenoid vitelliform deposit now resolved post vitrectomy and release of vitreomacular traction.  This condition stable.  No active CN VM.  OS large subfoveal pigment epithelial detachment type drusenoid deposit, no signs of CN VM, will continue to monitor and observe.             ASSESSMENT/PLAN:  Vitreomacular adhesion of left eye Subfoveal drusenoid deposit continued slow improvement and reached mottling And NVI fact resolution post vitrectomy for broad tangential VMA/VMT  Serous detachment of retinal pigment epithelium of left eye Improving macular condition post release of traction  Advanced nonexudative age-related macular degeneration of right eye with subfoveal involvement The nature of age--related macular degeneration was discussed with the patient as well as the distinction between dry and wet types. Checking an Amsler Grid daily with advice to return immediately should a distortion develop, was given to the patient. The patient 's smoking status now and in the past was determined and advice based on the AREDS study was provided regarding the consumption of antioxidant supplements. AREDS 2 vitamin formulation was recommended. Consumption of dark leafy vegetables and fresh fruits of various colors was recommended. Treatment modalities for wet macular degeneration particularly the use of intravitreal injections of anti-blood vessel growth factors was discussed with the patient. Avastin, Lucentis, and Eylea are the available options. On occasion, therapy includes the use of photodynamic therapy and thermal laser. Stressed to the patient do not rub eyes.  Patient was advised to check Amsler Grid daily and return immediately if changes are noted. Instructions on using the grid were given to the patient. All patient questions were answered.      ICD-10-CM   1. Intermediate  stage nonexudative age-related macular degeneration of both eyes  H35.3132 OCT, Retina - OU - Both Eyes    2. Vitreomacular adhesion of left eye  H43.822     3. Serous detachment of retinal pigment epithelium of left eye  H35.722     4. Advanced nonexudative age-related macular degeneration of right eye with subfoveal involvement  H35.3114       1.  Status post vitrectomy OU for broad-based vitreal macular adhesion leading to bilateral subfoveal drusenoid deposits and serous retinal detachment.  Each of these conditions have resolved post vitrectomy OU.  2.  Geographic atrophy more extensive in the right eye and subfoveal drusen still remain but much less dramatically so in each eye.  3.  No signs of CNVM will observe  Ophthalmic Meds Ordered this visit:  No orders of the defined types were placed in this encounter.      Return in about 6 months (around 11/29/2021) for DILATE OU, COLOR FP, OCT.  There are no Patient Instructions on file for this visit.   Explained the diagnoses, plan, and follow up with the patient and they expressed understanding.  Patient expressed  understanding of the importance of proper follow up care.   Clent Demark Dushawn Pusey M.D. Diseases & Surgery of the Retina and Vitreous Retina & Diabetic [place] 06/01/21     Abbreviations: M myopia (nearsighted); A astigmatism; H hyperopia (farsighted); P presbyopia; Mrx spectacle prescription;  CTL contact lenses; OD right eye; OS left eye; OU both eyes  XT exotropia; ET esotropia; PEK punctate epithelial keratitis; PEE punctate epithelial erosions; DES dry eye syndrome; MGD meibomian gland dysfunction; ATs artificial tears; PFAT's preservative free artificial tears; [place] nuclear sclerotic cataract; PSC posterior subcapsular cataract; ERM epi-retinal membrane; PVD posterior vitreous detachment; RD retinal detachment; DM diabetes mellitus; DR diabetic retinopathy; NPDR non-proliferative diabetic retinopathy; PDR  proliferative diabetic retinopathy; CSME clinically significant macular edema; DME diabetic macular edema; dbh dot blot hemorrhages; CWS cotton wool spot; POAG primary open angle glaucoma; C/D cup-to-disc ratio; HVF humphrey visual field; GVF goldmann visual field; OCT optical coherence tomography; IOP intraocular pressure; BRVO Branch retinal vein occlusion; CRVO central retinal vein occlusion; CRAO central retinal artery occlusion; BRAO branch retinal artery occlusion; RT retinal tear; SB scleral buckle; PPV pars plana vitrectomy; VH Vitreous hemorrhage; PRP panretinal laser photocoagulation; IVK intravitreal kenalog; VMT vitreomacular traction; MH Macular hole;  NVD neovascularization of the disc; NVE neovascularization elsewhere; AREDS age related eye disease study; ARMD age related macular degeneration; POAG primary open angle glaucoma; EBMD epithelial/anterior basement membrane dystrophy; ACIOL anterior chamber intraocular lens; IOL intraocular lens; PCIOL posterior chamber intraocular lens; Phaco/IOL phacoemulsification with intraocular lens placement; [place] photorefractive keratectomy; LASIK laser assisted in situ keratomileusis; HTN hypertension; DM diabetes mellitus; COPD chronic obstructive pulmonary disease

## 2021-06-01 NOTE — Assessment & Plan Note (Signed)
Subfoveal drusenoid deposit continued slow improvement and reached mottling And NVI fact resolution post vitrectomy for broad tangential VMA/VMT

## 2021-06-01 NOTE — Assessment & Plan Note (Signed)

## 2021-06-01 NOTE — Assessment & Plan Note (Signed)
Improving macular condition post release of traction

## 2021-06-02 ENCOUNTER — Telehealth: Payer: Self-pay | Admitting: Oncology

## 2021-06-02 NOTE — Telephone Encounter (Signed)
R/s appt per 9/16 sch msg. Called pt, no answer. Left msg with new appt date and time.

## 2021-06-08 ENCOUNTER — Ambulatory Visit: Payer: Medicare Other | Admitting: Oncology

## 2021-06-08 ENCOUNTER — Inpatient Hospital Stay (HOSPITAL_BASED_OUTPATIENT_CLINIC_OR_DEPARTMENT_OTHER): Payer: Medicare Other | Admitting: Oncology

## 2021-06-08 ENCOUNTER — Other Ambulatory Visit: Payer: Medicare Other

## 2021-06-08 ENCOUNTER — Inpatient Hospital Stay: Payer: Medicare Other

## 2021-06-08 DIAGNOSIS — Z170 Estrogen receptor positive status [ER+]: Secondary | ICD-10-CM

## 2021-06-08 DIAGNOSIS — C50212 Malignant neoplasm of upper-inner quadrant of left female breast: Secondary | ICD-10-CM

## 2021-06-17 NOTE — Progress Notes (Signed)
x

## 2021-06-20 DIAGNOSIS — N3020 Other chronic cystitis without hematuria: Secondary | ICD-10-CM | POA: Diagnosis not present

## 2021-08-03 ENCOUNTER — Other Ambulatory Visit: Payer: Self-pay | Admitting: Urology

## 2021-08-08 ENCOUNTER — Other Ambulatory Visit: Payer: Self-pay | Admitting: Oncology

## 2021-08-11 ENCOUNTER — Other Ambulatory Visit: Payer: Self-pay

## 2021-08-11 DIAGNOSIS — C50212 Malignant neoplasm of upper-inner quadrant of left female breast: Secondary | ICD-10-CM

## 2021-08-11 DIAGNOSIS — Z170 Estrogen receptor positive status [ER+]: Secondary | ICD-10-CM

## 2021-08-13 NOTE — Progress Notes (Signed)
[place]  Telephone:([phone]) [phone] Fax:(336) [phone]     ID: Debra Griffith DOB: Nov 04, 1940  MR#: [id number]  NHA#:[id number]  Patient Care Team: Josetta Huddle, MD as PCP - General (Internal Medicine) Garvin Ellena, Virgie Dad, MD as Consulting Physician (Oncology) Rolm Bookbinder, MD as Consulting Physician (General Surgery) Irene Limbo, MD as Consulting Physician (Plastic Surgery) Gaynelle Arabian, MD as Consulting Physician (Orthopedic Surgery) Bo Merino, MD as Consulting Physician (Rheumatology) Delice Bison, Charlestine Massed, NP as Nurse Practitioner (Hematology and Oncology) Bjorn Loser, MD as Consulting Physician (Urology) OTHER MD:   CHIEF COMPLAINT: Estrogen receptor positive breast cancer (s/p left mastectomy)  CURRENT TREATMENT: Anastrozole   INTERVAL HISTORY: Debra Griffith returns today for follow-up of her estrogen receptor positive breast cancer.  She is accompanied by her husband [name]  She continues on anastrozole she has tolerated this well, with no unusual side effects and particularly no hot flashes or vaginal dryness problems.  She never had arthralgias or myalgias.  Her most recent bone density screening on 01/26/2020 showed a T-score of -2.2, which is considered osteopenic.  Since her last visit, she underwent right screening mammography with tomography at [facility] on 05/02/2021 showing: breast density category B; no evidence of malignancy.    REVIEW OF SYSTEMS: Bobby is having significant urinary issues which are being managed through Dr. Wendy Poet.  She has had 3 falls, but all of them were related to climbing on something or slipping on some oil on the floor.  She does not have a balance problem.  She does use a Rollator all the time.  She recently burned her fingers while cooking and has lost a little bit of sensation on her finger pads as a result.  Aside from these issues a detailed review of systems was stable   COVID 19  VACCINATION STATUS: Pfizer x3 as of November 2022   BREAST CANCER HISTORY: From the original intake note:  Debra Griffith had minor trauma to the left breast but did not think much about it until while swimming on the medication she felt a hard mass in her left breast. She brought it to medical attention and on 05/04/2016 she underwent left diagnostic mammography with tomography at the [facility]. This showed the breast density to be category B. In the left breast upper inner quadrant there was a persistent area of asymmetry measuring 4.9 cm. On exam this was a firm but poorly defined palpable mass. Biopsy of this mass 05/04/2016 showed ([id number]) an invasive ductal carcinoma, grade 1, estrogen receptor 90% positive with moderate staining intensity, progesterone receptor 90% positive with strong staining intensity, with an MIB-1 of 10%, and no HER-2 amplification, the signals ratio being 1.19 and the number per cell 1.90.  On 05/09/2016 the patient had ultrasonography of the left axilla which was benign. On the same day she had a second biopsy of what is either a second mass or a distant area of the same mass (in the same quadrant) and this showed ([id number]) invasive ductal carcinoma, grade 2, estrogen receptor 100% positive, progesterone receptor 90% positive, both with strong staining intensity, with an MIB-1 of 15%, and no HER-2 amplification, the signals ratio being 1.50 and the number per cell 3.15.  The patient's subsequent history is as detailed below.   PAST MEDICAL HISTORY: Past Medical History:  Diagnosis Date   Anemia    hx of    Anxiety    Breast cancer in female [facility]) 03/2016   left   Chronic bronchitis (  [place])    FLARE UPS USUALLY ONCE A YEAR   Chronic lower back pain    Complication of anesthesia    trouble waking up    CRI (chronic renal insufficiency) 07/27/2016   DDD (degenerative disc disease), lumbar    Depression    GERD (gastroesophageal reflux disease)    History  of recent fall    "twice on Sunday; once yesterday" (08/29/2016)   Hypertension    Inflammatory arthritis 07/27/2016   Sero Negative, Positive Synovitis hands, WJ   Insomnia    Irregular heart beats    Macular degeneration of right eye    New onset atrial fibrillation (HCC)    OA (osteoarthritis) of hip 06/24/2012   OA (osteoarthritis) of knee 11/02/2013   L knee    Obesity    Osteoarthritis of both feet 07/27/2016   Osteoarthritis of both hands 07/27/2016   PONV (postoperative nausea and vomiting)    SEVERE N&V AND DIARRHEA AFTER HYSTERECTOMY AND AFTER WISDOM TEETH EXTRACTIONS - NO PROBLEMS WITH LAST 2 SURGERIES - THE HIP AND LEFT KNEE   Postop Acute blood loss anemia 06/25/2012   Postop Hyponatremia 06/25/2012   Postop Sinus tachycardia 06/27/2012   RBBB (right bundle branch block) 07/27/2016   Rheumatoid arthritis ([place])    "doctor recently took me off all RX for this" (08/02/2016)   Right bundle branch block    Right bundle branch block    S/p dental crown    dental crowns on every tooth   Status post total bilateral knee replacement 11/13/2013   Vitamin D deficiency     PAST SURGICAL HISTORY: Past Surgical History:  Procedure Laterality Date   AXILLARY SURGERY Left 06/25/2016   Aspiration of left axillary seroma    BREAST BIOPSY Left 03/2016   BREAST LUMPECTOMY WITH RADIOACTIVE SEED AND SENTINEL LYMPH NODE BIOPSY Left 06/13/2016   Procedure: LEFT BREAST LUMPECTOMY WITH BRACKETED  RADIOACTIVE SEED AND SENTINEL LYMPH NODE BIOPSY;  Surgeon: Rolm Bookbinder, MD;  Location: [facility];  Service: General;  Laterality: Left;   BREAST RECONSTRUCTION Bilateral 06/25/2016   BILATERAL ONCOPLASTIC BREAST RECONSTRUCTION WITH BREAST REDUCTION   BREAST RECONSTRUCTION WITH PLACEMENT OF TISSUE EXPANDER AND FLEX HD (ACELLULAR HYDRATED DERMIS) Bilateral 06/25/2016   Procedure: BILATERAL ONCOPLASTIC BREAST RECONSTRUCTION WITH BREAST REDUCTION, Aspiration of left axillary seroma;  Surgeon: Irene Limbo, MD;  Location: [facility];  Service: Plastics;  Laterality: Bilateral;   BREAST REDUCTION SURGERY Bilateral 06/25/2016   Procedure: MAMMARY REDUCTION  (BREAST) BILATERAL;  Surgeon: Irene Limbo, MD;  Location: [facility];  Service: Plastics;  Laterality: Bilateral;   CESAREAN SECTION  1966; 1971; 1973   COLONOSCOPY     DEBRIDEMENT AND CLOSURE WOUND Right 07/11/2016   Procedure: DEBRIDEMENT LEFT BREAST;  Surgeon: Irene Limbo, MD;  Location: [facility];  Service: Plastics;  Laterality: Right;   JOINT REPLACEMENT     KNEE ARTHROSCOPY Right    "before replacement"   MASTECTOMY COMPLETE / SIMPLE Left 08/02/2016   total   REDUCTION MAMMAPLASTY Bilateral 06/25/2016   TOTAL ABDOMINAL HYSTERECTOMY W/ BILATERAL SALPINGOOPHORECTOMY     TOTAL HIP ARTHROPLASTY  06/24/2012   Procedure: TOTAL HIP ARTHROPLASTY;  Surgeon: [name], MD;  Location: [facility];  Service: Orthopedics;  Laterality: Left;   TOTAL KNEE ARTHROPLASTY N/A 11/02/2013   Procedure: LEFT TOTAL KNEE ARTHROPLASTY WITH RIGHT KNEE CORTISONE INJECTION;  Surgeon: [name], MD;  Location: [facility];  Service: Orthopedics;  Laterality: N/A;   TOTAL KNEE ARTHROPLASTY Right 05/31/2014  Procedure: RIGHT TOTAL KNEE ARTHROPLASTY;  Surgeon: [name], MD;  Location: [facility];  Service: Orthopedics;  Laterality: Right;   TOTAL MASTECTOMY Left 08/02/2016   Procedure: LEFT TOTAL MASTECTOMY;  Surgeon: Rolm Bookbinder, MD;  Location[facility];  Service: General;  Laterality: Left;   [place] EXTRACTION  1970's   admitted to hospital for surgery    FAMILY HISTORY Family History  Problem Relation Age of Onset   Hypertension Mother    Prostate cancer Father    Hypertension Father    Heart Problems Father        quadruple bypass   Hypertension Sister    The patient's mother died at age 24. The patient's father died at age 72 following a stroke. The patient had no brothers, 1 sister. There is no history of breast  or ovarian cancer in the family.    GYNECOLOGIC HISTORY:  No LMP recorded. Patient has had a hysterectomy. Menarche age 33, first live birth age 35. The patient is GX P3. She underwent total abdominal hysterectomy, with bilateral salpingo-oophorectomy, in 1993. She used estrogen replacement approximately 9 months. She never used oral contraceptives.   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 Her husband [name] used to r be in businesses but he also is retired. Son Aaron Edelman is a Programme researcher, broadcasting/film/video in [place]. Son Legrand Como is a Engineer, maintenance (IT) in [employer]. Daughter Jla Reynolds lives in [place]. 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 The patient has 5 grandchildren. She is a Nurse, learning disability, currently attending [employer]: In place    HEALTH MAINTENANCE: Social History   Tobacco Use   Smoking status: Never   Smokeless tobacco: Never  Substance Use Topics   Alcohol use: No   Drug use: Yes    Types: Other-see comments    Comment: CBD oil     Colonoscopy: 2015/[facility]  PAP:  Bone density: [facility] that showed: T-score of -1.8.    Allergies  Allergen Reactions   Morphine And Related Other (See Comments)    hallucinations    Current Outpatient Medications  Medication Sig Dispense Refill   amLODipine (NORVASC) 2.5 MG tablet Take 2.5 mg by mouth daily.     anastrozole (ARIMIDEX) 1 MG tablet Take 1 tablet by mouth once daily 90 tablet 0   cholecalciferol (VITAMIN D) 1000 units tablet Take 1,000 Units by mouth daily.     clonazePAM (KLONOPIN) 1 MG tablet Take 2 mg by mouth 2 (two) times daily.      cyclobenzaprine (FLEXERIL) 10 MG tablet      fluorouracil (EFUDEX) 5 % cream fluorouracil 5 % topical cream     hydrALAZINE (APRESOLINE) 50 MG tablet      HYDROcodone-acetaminophen (NORCO) 10-325 MG tablet      MAGNESIUM PO Take 2 tablets by mouth daily.     metoprolol  tartrate (LOPRESSOR) 25 MG tablet Take 25 mg by mouth 2 (two) times daily.      Misc Natural Products (TART CHERRY ADVANCED PO) Take 1 capsule by mouth daily.     nitrofurantoin (MACRODANTIN) 100 MG capsule Take 1 capsule (100 mg total) by mouth at bedtime.     Omega-3 Fatty Acids (FISH OIL) 1000 MG CAPS Take 1,000 mg by mouth daily.     oxybutynin (DITROPAN-XL) 10 MG 24 hr tablet  Saccharomyces boulardii (PROBIOTIC) 250 MG CAPS Take by mouth. 60 capsule    triamcinolone ointment (KENALOG) 0.5 % Apply 1 application topically 2 (two) times daily. 30 g 1   triamterene-hydrochlorothiazide (MAXZIDE-25) 37.5-25 MG tablet      Turmeric 500 MG CAPS Take 500 mg by mouth daily.      valsartan (DIOVAN) 320 MG tablet      venlafaxine XR (EFFEXOR-XR) 75 MG 24 hr capsule Take 1 capsule (75 mg total) by mouth 2 (two) times daily. 180 capsule 0   vitamin C (ASCORBIC ACID) 500 MG tablet Take 1,000 mg by mouth daily.     zolpidem (AMBIEN) 10 MG tablet zolpidem 10 mg tablet     No current facility-administered medications for this visit.   Facility-Administered Medications Ordered in Other Visits  Medication Dose Route Frequency Provider Last Rate Last Admin   methocarbamol (ROBAXIN) tablet 500 mg  500 mg Oral Q6H PRN Rolm Bookbinder, MD       metroNIDAZOLE (FLAGYL) tablet 500 mg  500 mg Oral Q8H Rolm Bookbinder, MD       ondansetron (ZOFRAN-ODT) disintegrating tablet 4 mg  4 mg Oral Q6H PRN Rolm Bookbinder, MD       Or   ondansetron [facility]) 4 mg in sodium chloride 0.9 % 50 mL IVPB  4 mg Intravenous Q6H PRN Rolm Bookbinder, MD       oxyCODONE (Oxy IR/ROXICODONE) immediate release tablet 5-10 mg  5-10 mg Oral Q4H PRN Rolm Bookbinder, MD       simethicone [facility]) chewable tablet 40 mg  40 mg Oral Q6H PRN Rolm Bookbinder, MD        OBJECTIVE: White woman using a Rollator  Vitals:   08/14/21 1140  BP: 117/67  Pulse: 73  Resp: 16  Temp: 97.9 F (36.6 C)  SpO2: 97%    Wt  Readings from Last 3 Encounters:  08/14/21 278 lb (126.1 kg)  06/08/20 298 lb 4.8 oz (135.3 kg)  04/21/20 (!) 302 lb (137 kg)   Body mass index is 50.85 kg/m.    ECOG FS:1 - Symptomatic but completely ambulatory  Sclerae unicteric, EOMs intact Wearing a mask No cervical or supraclavicular adenopathy Lungs no rales or rhonchi Heart regular rate and rhythm Abd soft, nontender, positive bowel sounds MSK no focal spinal tenderness, no upper extremity lymphedema Neuro: nonfocal, well oriented, appropriate affect Breasts: The right breast is unremarkable.  The left breast is status postmastectomy.  There is no evidence of local recurrence.  Both axillae are benign.   LAB RESULTS:  CMP     Component Value Date/Time   NA 137 06/08/2020 0857   NA 140 07/04/2017 1112   K 3.9 06/08/2020 0857   K 4.0 07/04/2017 1112   CL 103 06/08/2020 0857   CO2 26 06/08/2020 0857   CO2 28 07/04/2017 1112   GLUCOSE 124 (H) 06/08/2020 0857   GLUCOSE 106 07/04/2017 1112   BUN 21 06/08/2020 0857   BUN 26.7 (H) 07/04/2017 1112   CREATININE 0.87 06/08/2020 0857   CREATININE 1.14 (H) 02/05/2018 1012   CREATININE 1.1 07/04/2017 1112   CALCIUM 9.6 06/08/2020 0857   CALCIUM 9.8 07/04/2017 1112   PROT 6.7 06/08/2020 0857   PROT 6.9 07/04/2017 1112   ALBUMIN 3.6 06/08/2020 0857   ALBUMIN 3.8 07/04/2017 1112   AST 25 06/08/2020 0857   AST 26 02/05/2018 1012   AST 21 07/04/2017 1112   ALT 36 06/08/2020 0857   ALT 37 02/05/2018 1012  ALT 29 07/04/2017 1112   ALKPHOS 86 06/08/2020 0857   ALKPHOS 87 07/04/2017 1112   BILITOT 0.3 06/08/2020 0857   BILITOT 0.3 02/05/2018 1012   BILITOT 0.37 07/04/2017 1112   GFRNONAA >60 06/08/2020 0857   GFRNONAA 46 (L) 02/05/2018 1012   GFRAA >60 06/08/2020 0857   GFRAA 53 (L) 02/05/2018 1012    INo results found for: SPEP, UPEP  Lab Results  Component Value Date   WBC 7.0 08/14/2021   NEUTROABS 4.0 08/14/2021   HGB 13.0 08/14/2021   HCT 38.9 08/14/2021    MCV 89.4 08/14/2021   PLT 208 08/14/2021      Chemistry      Component Value Date/Time   NA 137 06/08/2020 0857   NA 140 07/04/2017 1112   K 3.9 06/08/2020 0857   K 4.0 07/04/2017 1112   CL 103 06/08/2020 0857   CO2 26 06/08/2020 0857   CO2 28 07/04/2017 1112   BUN 21 06/08/2020 0857   BUN 26.7 (H) 07/04/2017 1112   CREATININE 0.87 06/08/2020 0857   CREATININE 1.14 (H) 02/05/2018 1012   CREATININE 1.1 07/04/2017 1112   GLU 95 01/20/2016 0000      Component Value Date/Time   CALCIUM 9.6 06/08/2020 0857   CALCIUM 9.8 07/04/2017 1112   ALKPHOS 86 06/08/2020 0857   ALKPHOS 87 07/04/2017 1112   AST 25 06/08/2020 0857   AST 26 02/05/2018 1012   AST 21 07/04/2017 1112   ALT 36 06/08/2020 0857   ALT 37 02/05/2018 1012   ALT 29 07/04/2017 1112   BILITOT 0.3 06/08/2020 0857   BILITOT 0.3 02/05/2018 1012   BILITOT 0.37 07/04/2017 1112       No results found for: LABCA2  No components found for: LABCA125  No results for input(s): INR in the last 168 hours.  Urinalysis    Component Value Date/Time   COLORURINE YELLOW 08/29/2016 2120   APPEARANCEUR HAZY (A) 08/29/2016 2120   LABSPEC 1.014 08/29/2016 2120   PHURINE 5.0 08/29/2016 2120   GLUCOSEU NEGATIVE 08/29/2016 2120   HGBUR NEGATIVE 08/29/2016 2120   BILIRUBINUR n 01/25/2020 1101   KETONESUR NEGATIVE 08/29/2016 2120   PROTEINUR Negative 01/25/2020 1101   PROTEINUR NEGATIVE 08/29/2016 2120   UROBILINOGEN negative (A) 01/25/2020 1101   UROBILINOGEN 0.2 05/26/2014 1054   NITRITE n 01/25/2020 1101   NITRITE NEGATIVE 08/29/2016 2120   LEUKOCYTESUR Small (1+) (A) 01/25/2020 1101    STUDIES: No results found.   ELIGIBLE FOR AVAILABLE RESEARCH PROTOCOL: no  ASSESSMENT: 80 y.o. [place] woman status post left breast upper inner quadrant biopsy 05/04/2016 for a clinical T2 N0 invasive ductal carcinoma, grade 1 estrogen and progesterone receptor positive, HER-2 nonamplified, with an MIB-1 of 10%.  (1) biopsy of a  second area 05/09/2016 also in the upper inner quadrant of the left breast showed invasive ductal carcinoma, grade 2, estrogen and progesterone receptor positive, HER-2 nonamplified, with an MIB-1 of 15%.  (2) status post left lumpectomy and sentinel lymph node sampling 06/13/2016 for an mpT1c pN0(i+), stage IA invasive ductal carcinoma, grade 1, with close but negative margins  (a) status post bilateral reduction mammoplasty with left oncoplastic surgery 06/25/2016  (b) status post debridement of left nipple/areolar necrosis 07/11/2016  (c) status post left simple mastectomy 08/02/2016 showing inflammation and abscess but no evidence of malignancy  (3) Oncotype score of 18 predicts a 10 year risk of recurrence outside the breast of 11% if the patient's only systemic therapy is tamoxifen for  5 years. It also predicts no significant benefit from chemotherapy  (4) postmastectomy radiation not indicated  (5) started anastrozole September 2017, completing five years November 2022  (a) DEXA scan 04/17/2017 showed a T score of -1.8   (B DEXA scan 01/26/2020) showed a T score of -2.2.   PLAN: Sarenity is now 5 years out from definitive surgery for her breast cancer with no evidence of disease recurrence.  This is favorable.  She has completed 5 years of anastrozole.  In her case I do not see any indications for continuing beyond 5 years so she may stop the medication at this point.  She requested a refill on the venlafaxine and I have given her a years worth.  After that she will have it refilled at her primary care physicians discretion.  She understands that she should be careful not to run out of venlafaxine.  If she decides to discontinue the medication she will have to do it with a taper and we will be glad to give her instructions on how to do that.  At this point I feel comfortable releasing her to her primary care physician.  All she will need in terms of breast cancer follow-up is a yearly  right mammogram and a yearly physician chest wall exam.  We will be glad to see Bonney again at any point in the future if and when the need arises but as of now are making no further routine appointments for her here.  Total encounter time 25 minutes.*   Debra Griffith, Virgie Dad, MD  08/14/21 11:43 AM Medical Oncology and Hematology [facility] [place],  [address] Tel. [phone]    Fax. [phone]   I, Wilburn Mylar, am acting as scribe for Dr. Virgie Dad. Kaile Bixler.  I, Lurline Del MD, have reviewed the above documentation for accuracy and completeness, and I agree with the above.   *Total Encounter Time as defined by the Centers for Medicare and Medicaid Services includes, in addition to the face-to-face time of a patient visit (documented in the note above) non-face-to-face time: obtaining and reviewing outside history, ordering and reviewing medications, tests or procedures, care coordination (communications with other health care professionals or caregivers) and documentation in the medical record.

## 2021-08-14 ENCOUNTER — Inpatient Hospital Stay: Payer: Medicare Other | Attending: Oncology

## 2021-08-14 ENCOUNTER — Other Ambulatory Visit: Payer: Self-pay

## 2021-08-14 ENCOUNTER — Inpatient Hospital Stay: Payer: Medicare Other | Admitting: Oncology

## 2021-08-14 VITALS — BP 117/67 | HR 73 | Temp 97.9°F | Resp 16 | Ht 62.0 in | Wt 278.0 lb

## 2021-08-14 DIAGNOSIS — G4700 Insomnia, unspecified: Secondary | ICD-10-CM | POA: Diagnosis not present

## 2021-08-14 DIAGNOSIS — Z853 Personal history of malignant neoplasm of breast: Secondary | ICD-10-CM | POA: Insufficient documentation

## 2021-08-14 DIAGNOSIS — C50212 Malignant neoplasm of upper-inner quadrant of left female breast: Secondary | ICD-10-CM

## 2021-08-14 DIAGNOSIS — Z8042 Family history of malignant neoplasm of prostate: Secondary | ICD-10-CM | POA: Diagnosis not present

## 2021-08-14 DIAGNOSIS — Z79899 Other long term (current) drug therapy: Secondary | ICD-10-CM | POA: Diagnosis not present

## 2021-08-14 DIAGNOSIS — F329 Major depressive disorder, single episode, unspecified: Secondary | ICD-10-CM | POA: Insufficient documentation

## 2021-08-14 DIAGNOSIS — K219 Gastro-esophageal reflux disease without esophagitis: Secondary | ICD-10-CM | POA: Insufficient documentation

## 2021-08-14 DIAGNOSIS — M1990 Unspecified osteoarthritis, unspecified site: Secondary | ICD-10-CM | POA: Insufficient documentation

## 2021-08-14 DIAGNOSIS — M545 Low back pain, unspecified: Secondary | ICD-10-CM | POA: Insufficient documentation

## 2021-08-14 DIAGNOSIS — I4891 Unspecified atrial fibrillation: Secondary | ICD-10-CM | POA: Insufficient documentation

## 2021-08-14 DIAGNOSIS — Z170 Estrogen receptor positive status [ER+]: Secondary | ICD-10-CM

## 2021-08-14 DIAGNOSIS — M069 Rheumatoid arthritis, unspecified: Secondary | ICD-10-CM | POA: Diagnosis not present

## 2021-08-14 LAB — CBC WITH DIFFERENTIAL (CANCER CENTER ONLY)
Abs Immature Granulocytes: 0.02 10*3/uL (ref 0.00–0.07)
Basophils Absolute: 0 10*3/uL (ref 0.0–0.1)
Basophils Relative: 1 %
Eosinophils Absolute: 0.3 10*3/uL (ref 0.0–0.5)
Eosinophils Relative: 4 %
HCT: 38.9 % (ref 36.0–46.0)
Hemoglobin: 13 g/dL (ref 12.0–15.0)
Immature Granulocytes: 0 %
Lymphocytes Relative: 29 %
Lymphs Abs: 2 10*3/uL (ref 0.7–4.0)
MCH: 29.9 pg (ref 26.0–34.0)
MCHC: 33.4 g/dL (ref 30.0–36.0)
MCV: 89.4 fL (ref 80.0–100.0)
Monocytes Absolute: 0.6 10*3/uL (ref 0.1–1.0)
Monocytes Relative: 8 %
Neutro Abs: 4 10*3/uL (ref 1.7–7.7)
Neutrophils Relative %: 58 %
Platelet Count: 208 10*3/uL (ref 150–400)
RBC: 4.35 MIL/uL (ref 3.87–5.11)
RDW: 13.2 % (ref 11.5–15.5)
WBC Count: 7 10*3/uL (ref 4.0–10.5)
nRBC: 0 % (ref 0.0–0.2)

## 2021-08-14 LAB — CMP (CANCER CENTER ONLY)
ALT: 24 U/L (ref 0–44)
AST: 20 U/L (ref 15–41)
Albumin: 3.8 g/dL (ref 3.5–5.0)
Alkaline Phosphatase: 77 U/L (ref 38–126)
Anion gap: 10 (ref 5–15)
BUN: 21 mg/dL (ref 8–23)
CO2: 27 mmol/L (ref 22–32)
Calcium: 9.5 mg/dL (ref 8.9–10.3)
Chloride: 104 mmol/L (ref 98–111)
Creatinine: 0.99 mg/dL (ref 0.44–1.00)
GFR, Estimated: 58 mL/min — ABNORMAL LOW (ref >60)
Glucose, Bld: 114 mg/dL — ABNORMAL HIGH (ref 70–99)
Potassium: 3.7 mmol/L (ref 3.5–5.1)
Sodium: 141 mmol/L (ref 135–145)
Total Bilirubin: 0.5 mg/dL (ref 0.3–1.2)
Total Protein: 6.6 g/dL (ref 6.5–8.1)

## 2021-08-14 MED ORDER — VENLAFAXINE HCL ER 75 MG PO CP24: 75.0000 mg | ORAL_CAPSULE | Freq: Two times a day (BID) | ORAL | 4 refills | Status: DC

## 2021-08-21 NOTE — Patient Instructions (Signed)
DUE TO COVID-19 ONLY ONE VISITOR IS ALLOWED TO COME WITH YOU AND STAY IN THE WAITING ROOM ONLY DURING PRE OP AND PROCEDURE.   **NO VISITORS ARE ALLOWED IN THE SHORT STAY AREA OR RECOVERY ROOM!!**  IF YOU WILL BE ADMITTED INTO THE HOSPITAL YOU ARE ALLOWED ONLY TWO SUPPORT PEOPLE DURING VISITATION HOURS ONLY (7 AM -8PM)   The support person(s) must pass our screening, gel in and out, and wear a mask at all times, including in the patient's room. Patients must also wear a mask when staff or their support person are in the room. Visitors GUEST BADGE MUST BE WORN VISIBLY  One adult visitor may remain with you overnight and MUST be in the room by 8 P.M.  No visitors under the age of 69. Any visitor under the age of 89 must be accompanied by an adult.     Your procedure is scheduled on: 08/29/21   Report to [facility] Main Entrance    Report to short stay at : 5:15 AM   Call this number if you have problems the morning of surgery [phone]   Do not eat food :After Midnight.   May have liquids until : 4:30 AM   day of surgery  CLEAR LIQUID DIET  Foods Allowed                                                                     Foods Excluded  Water, Black Coffee and tea, regular and decaf                             liquids that you cannot  Plain Jell-O in any flavor  (No red)                                           see through such as: Fruit ices (not with fruit pulp)                                     milk, soups, orange juice              Iced Popsicles (No red)                                    All solid food                                   Apple juices Sports drinks like Gatorade (No red) Lightly seasoned clear broth or consume(fat free) Sugar  Sample Menu Breakfast                                Lunch  Supper Cranberry juice                    Beef broth                            Chicken broth Jell-O                                      Grape juice                           Apple juice Coffee or tea                        Jell-O                                      Popsicle                                                Coffee or tea                        Coffee or tea     Oral Hygiene is also important to reduce your risk of infection.                                    Remember - BRUSH YOUR TEETH THE MORNING OF SURGERY WITH YOUR REGULAR TOOTHPASTE   Do NOT smoke after Midnight   Take these medicines the morning of surgery with A SIP OF WATER: clonazepam,apresoline,metoprolol,amlodipine.  DO NOT TAKE ANY ORAL DIABETIC MEDICATIONS DAY OF YOUR SURGERY                              You may not have any metal on your body including hair pins, jewelry, and body piercing             Do not wear make-up, lotions, powders, perfumes/cologne, or deodorant  Do not wear nail polish including gel and S&S, artificial/acrylic nails, or any other type of covering on natural nails including finger and toenails. If you have artificial nails, gel coating, etc. that needs to be removed by a nail salon please have this removed prior to surgery or surgery may need to be canceled/ delayed if the surgeon/ anesthesia feels like they are unable to be safely monitored.   Do not shave  48 hours prior to surgery.    Do not bring valuables to the hospital. [place].   Contacts, dentures or bridgework may not be worn into surgery.   Bring small overnight bag day of surgery.    Patients discharged on the day of surgery will not be allowed to drive home.   Special Instructions: Bring a copy of your healthcare power of attorney and living will documents  the day of surgery if you haven't scanned them before.              Please read over the following fact sheets you were given: IF YOU HAVE QUESTIONS ABOUT YOUR PRE-OP INSTRUCTIONS PLEASE CALL [phone]     [facility] - Preparing  for Surgery Before surgery, you can play an important role.  Because skin is not sterile, your skin needs to be as free of germs as possible.  You can reduce the number of germs on your skin by washing with CHG (chlorahexidine gluconate) soap before surgery.  CHG is an antiseptic cleaner which kills germs and bonds with the skin to continue killing germs even after washing. Please DO NOT use if you have an allergy to CHG or antibacterial soaps.  If your skin becomes reddened/irritated stop using the CHG and inform your nurse when you arrive at Short Stay. Do not shave (including legs and underarms) for at least 48 hours prior to the first CHG shower.  You may shave your face/neck. Please follow these instructions carefully:  1.  Shower with CHG Soap the night before surgery and the  morning of Surgery.  2.  If you choose to wash your hair, wash your hair first as usual with your  normal  shampoo.  3.  After you shampoo, rinse your hair and body thoroughly to remove the  shampoo.                           4.  Use CHG as you would any other liquid soap.  You can apply chg directly  to the skin and wash                       Gently with a scrungie or clean washcloth.  5.  Apply the CHG Soap to your body ONLY FROM THE NECK DOWN.   Do not use on face/ open                           Wound or open sores. Avoid contact with eyes, ears mouth and genitals (private parts).                       Wash face,  Genitals (private parts) with your normal soap.             6.  Wash thoroughly, paying special attention to the area where your surgery  will be performed.  7.  Thoroughly rinse your body with warm water from the neck down.  8.  DO NOT shower/wash with your normal soap after using and rinsing off  the CHG Soap.                9.  Pat yourself dry with a clean towel.            10.  Wear clean pajamas.            11.  Place clean sheets on your bed the night of your first shower and do not  sleep with  pets. Day of Surgery : Do not apply any lotions/deodorants the morning of surgery.  Please wear clean clothes to the hospital/surgery center.  FAILURE TO FOLLOW THESE INSTRUCTIONS MAY RESULT IN THE CANCELLATION OF YOUR SURGERY PATIENT SIGNATURE_________________________________  NURSE SIGNATURE__________________________________  ________________________________________________________________________

## 2021-08-23 ENCOUNTER — Encounter (HOSPITAL_COMMUNITY)
Admission: RE | Admit: 2021-08-23 | Discharge: 2021-08-23 | Disposition: A | Discharge status: Home / Self Care | Payer: Medicare Other | Source: Ambulatory Visit | Attending: Urology | Admitting: Urology

## 2021-08-23 ENCOUNTER — Other Ambulatory Visit: Payer: Self-pay

## 2021-08-23 ENCOUNTER — Encounter (HOSPITAL_COMMUNITY): Payer: Self-pay

## 2021-08-23 DIAGNOSIS — Z01818 Encounter for other preprocedural examination: Secondary | ICD-10-CM | POA: Insufficient documentation

## 2021-08-23 NOTE — Progress Notes (Signed)
COVID Vaccine Completed: Yes Date COVID Vaccine completed: 2022 x 5 COVID vaccine manufacturer: [place] Test: N/A.  PCP - Dr. Josetta Huddle  Cardiologist - NO  Chest x-ray -  EKG -  Stress Test -  ECHO - 08/30/16 Cardiac Cath -  Pacemaker/ICD device last checked:  Sleep Study -  CPAP -   Fasting Blood Sugar -  Checks Blood Sugar _____ times a day  Blood Thinner Instructions: Aspirin Instructions: Last Dose:  Anesthesia review: Hx: Rt. BBB,CRI,HTN,Irregular HB.  Patient denies shortness of breath, fever, cough and chest pain at PAT appointment   Patient verbalized understanding of instructions that were given to them at the PAT appointment. Patient was also instructed that they will need to review over the PAT instructions again at home before surgery.

## 2021-08-25 NOTE — H&P (Signed)
I was consulted by the above provider to assess the patient's worsening urinary incontinence last 6 months. She has urge incontinence. She has typical triggering going from a sitting to standing position. She has foot on the floor syndrome the middle of the night. She denies stress incontinence and bedwetting. She wears 2 heavy pads at night and can soak multiple pads up to 6 a day.   She gets 6 bladder infections year responding to antibiotics but not so much in the last year or so.   Clinically patient has urge incontinence with typical triggers. When she timed voids she does a bit better. She has moderate frequency and nocturia.  My culture was positive. Treated with Keflex. Ultrasound normal except for renal cyst. She was given Diflucan for possible vaginitis. She was put on daily Keflex   I found the conversation difficult today but I think the patient was doing well on daily Keflex but may be getting send yeast infections. Today she does not think she has 1. She did have labial redness and or ulcers at 1 time apparently. I think she is about 50% better on oxybutynin Keflex. She actually stop the once a day antibiotic a few weeks ago and then 2 days ago she started having burning went back on it.   I thought it was best to switch her to daily Macrodantin because of the yeast tissue. I offered Myrbetriq 50 mg samples and prescription   We will call in Macrodantin 100 mg 90 tablets and 3 refills. We will give her Myrbetriq 25 mg samples and prescription and see me in 5 weeks. We can always go back on the oxybutynin. I found any the conversation circular   I believe the patient has gone back and taken Myrbetriq with the oxybutynin and is doing dramatically better with much less urge incontinence.  She says she still has yeast issues and she has had this ongoing. She understands it is uncommon for the occur with Macrodantin.  On April 14th she was called in 3 days of Diflucan. She has an odor. I will  have her see 1 of the gynecologists since this seems to be a chronic issue.  Patient transiently has less itching with Diflucan then symptoms return. She understands Macrodantin rarely if ever could ever cause a yeast infection. Stay on both overactive bladder medications and Macrodantin reassess durability in 4 months   Urge incontinence better but still has near foot on the floor syndrome when she gets on a cold bathroom floor.  We talked about percutaneous tibial nerve stimulation and handout given and she like to proceed  Oxybutynin Myrbetriq prescription 90 x 3 sent. Handout given. PTNS ordered. If she does well we may be able to wean off some of her overactive bladder medication.   Myrbetriq did not help. On oxybutynin. On Macrodantin very pleased with no infections. Failed percutaneous tibial nerve stimulation. She can wear up to tender 12 pads a day. cold air is another trigger  pathophysiology of incontinence discussed. Because the severity and refractory nature ordered urodynamics. By history of already done cystoscopy. Obesity may limit some treatments and she could have trouble catheterize in. Will proceed accordingly. I will ask her she can lay on her stomach regarding InterStim   Today  Incontinence stable. Frequency stable. She did not void was catheterized for 100 mL. Maximum bladder capacity was 323 mL. Bladder was stable. She had increased bladder sensation. No stress incontinence with Valsalva pressure 78 cm of water. During  voluntary voiding she voided 279 mL with a maximum flow of 17 mL/second. Maximum voiding pressure 26 cm water. Residual 43 mL. EMG activity decreased during the voiding phase. Bladder neck descended 1 cm. Bladder was mildly trabeculated. The details of the urodynamics are signed and dictated   I went over Botox and InterStim with full template. She says she does have some trouble laying on her stomach. I stood her up and we may need to use a longer foramen needle.  She is not fearful of either treatment but is a bit concerned about the retention issue. She would need to have a catheter with various trial of voidings. She would like to try the test stimulation for refractory urge incontinence affecting her quality life. This will be done under IV sedation the surgery center. Both handouts were given.   Having a tremendous time a dry mouth and dental issues so will stop the oxybutynin that may not be helping. Call if culture positive. Renewed daily Macrodantin which she had run out of 90 x 3     ALLERGIES: Morphine - sensitivity    MEDICATIONS: Metoprolol Tartrate  Oxybutynin Chloride Er 10 mg tablet, extended release 24 hr 2 tablet PO Daily  Amlodipine Besylate  Anastrozole  Children's Aspirin  Clonazepam  Cyclobenzaprine Hcl  Hydrocodone Bitartrate  Triamterene  Valsartan     GU PSH: Complex cystometrogram, w/ void pressure and urethral pressure profile studies, any technique - 05/24/2021 Complex Uroflow - 05/24/2021 Cystoscopy - 2019 Emg surf Electrd - 05/24/2021 Inject For cystogram - 05/24/2021 Intrabd voidng Press - 05/24/2021     NON-GU PSH: Breast lumpectomy - 2017 Cataract surgery - 2018 Eye Surgery (Unspecified), Retina - 2018 Hip Replacement - 2013 Knee replacement, 2014 and 2016 - 2016 Neuroeltrd Stim Post Tibial - 02/23/2021, 01/26/2021, 12/29/2020, 11/24/2020, 10/27/2020, 09/29/2020, 09/01/2020, 08/30/2020, 08/25/2020, 08/23/2020, 08/16/2020, 08/09/2020, 08/02/2020, 07/19/2020, 07/12/2020, 07/05/2020, 06/28/2020, 06/21/2020     GU PMH: Urge incontinence - 05/24/2021, - 03/30/2021, - 02/23/2021, - 01/26/2021, - 12/29/2020, - 11/24/2020, - 10/27/2020, - 09/29/2020, - 09/01/2020, - 08/30/2020, - 08/25/2020, - 08/23/2020, - 08/16/2020, - 08/09/2020, - 08/02/2020, - 07/19/2020, - 07/12/2020, - 07/05/2020, - 06/28/2020, - 06/21/2020, - 05/31/2020, - 2019 Urinary Frequency - 05/24/2021, - 02/23/2021, - 01/26/2021, - 12/29/2020, - 11/24/2020, - 10/27/2020, - 09/29/2020, -  09/01/2020, - 08/30/2020, - 08/25/2020, - 08/23/2020, - 08/16/2020, - 08/09/2020, - 08/02/2020, - 07/19/2020, - 07/12/2020, - 07/05/2020, - 06/28/2020, - 06/21/2020, - 2019 Chronic cystitis (w/o hematuria) - 03/30/2021, - 05/31/2020, - 08/12/2019 Oth urogenital candidiasis - 08/12/2019 Candidiasis of vulva and vagina - 2019 Obstructive and reflux uropathy, Unspec - 2019 Nocturia - 2019 Urinary Tract Inf, Unspec site - 2019      PMH Notes: Cancer, Unspecified   NON-GU PMH: Anxiety Arthritis Depression Sleep Apnea    FAMILY HISTORY: Arthritis - Other Blood In Urine - Mother father deceased - Other Glaucoma - Father Heart Disease - Father Kidney Stones - Father mother deceased - Other Prostate Cancer - Father   SOCIAL HISTORY: Marital Status: Married Current Smoking Status: Patient has never smoked.   Tobacco Use Assessment Completed: Used Tobacco in last 30 days? Has never drank.  Drinks 1 caffeinated drink per day. Patient's occupation is/was Retired.    REVIEW OF SYSTEMS:    GU Review Female:   Patient denies frequent urination, hard to postpone urination, burning /pain with urination, get up at night to urinate, leakage of urine, stream starts and stops, trouble starting your stream, have to strain to  urinate, and being pregnant.  Gastrointestinal (Upper):   Patient denies nausea, vomiting, and indigestion/ heartburn.  Gastrointestinal (Lower):   Patient denies diarrhea and constipation.  Constitutional:   Patient denies fever, night sweats, weight loss, and fatigue.  Skin:   Patient denies skin rash/ lesion and itching.  Eyes:   Patient denies double vision and blurred vision.  Ears/ Nose/ Throat:   Patient denies sore throat and sinus problems.  Hematologic/Lymphatic:   Patient denies swollen glands and easy bruising.  Cardiovascular:   Patient denies leg swelling and chest pains.  Respiratory:   Patient denies cough and shortness of breath.  Endocrine:   Patient denies  excessive thirst.  Musculoskeletal:   Patient denies back pain and joint pain.  Neurological:   Patient denies headaches and dizziness.  Psychologic:   Patient denies depression and anxiety.   VITAL SIGNS: None   PAST DATA REVIEW: None   PROCEDURES:          Urinalysis w/Scope Dipstick Dipstick Cont'd Micro  Color: Yellow Bilirubin: Neg mg/dL WBC/hpf: 6 - 10/hpf  Appearance: Clear Ketones: Neg mg/dL RBC/hpf: 0 - 2/hpf  Specific Gravity: 1.020 Blood: Neg ery/uL Bacteria: Few (10-25/hpf)  pH: 6.5 Protein: Neg mg/dL Cystals: NS (Not Seen)  Glucose: Neg mg/dL Urobilinogen: 0.2 mg/dL Casts: NS (Not Seen)    Nitrites: Neg Trichomonas: Not Present    Leukocyte Esterase: 1+ leu/uL Mucous: Not Present      Epithelial Cells: 0 - 5/hpf      Yeast: NS (Not Seen)      Sperm: Not Present    ASSESSMENT:      ICD-10 Details  1 GU:   Chronic cystitis (w/o hematuria) - N30.20   2   Urge incontinence - N39.41               Notes:   Pros, cons, success and failure rates of Interstim were discussed. We talked about the test stimulation (office/operating room) and the second stage procedure. Risks were described but not limited to the risk of persistent, de novo, or worsening incontinence. Risks of pain, bleeding, infection, and neuropathy were discussed. Risk of malfunction, migration, and breakage were discussed. Trouble-shooting, battery life, and the need for explantation and reoperation were discussed. MRI issues were discussed. The patient understands that she might not reach her treatment goal and that she might be worse following surgery.    Pros, cons, success and failure rates of BOTOX were discussed. We talked about off-label usage, durability, and retreatment rates. Risks were described but not limited to the risk of persistent, de novo, or worsening incontinence. We talked about the risk of retention requiring catheterization. We talked about the risk of flow symptoms and high residual urine  volumes. Risks of pain, bleeding, infection, and neuropathy were discussed. Rare risks of nerve paralysis and death were discussed. The patient understands that she might not reach her treatment goal and that she might be worse following surgery.

## 2021-08-28 NOTE — Anesthesia Preprocedure Evaluation (Addendum)
Anesthesia Evaluation  Patient identified by MRN, date of birth, ID band Patient awake    Reviewed: Allergy & Precautions, NPO status , Patient's Chart, lab work & pertinent test results  History of Anesthesia Complications (+) PONV  Airway Mallampati: II  TM Distance: >3 FB     Dental no notable dental hx.    Pulmonary neg pulmonary ROS,    Pulmonary exam normal        Cardiovascular hypertension, + dysrhythmias  Rhythm:Regular Rate:Normal     Neuro/Psych Anxiety Depression negative neurological ROS     GI/Hepatic Neg liver ROS, GERD  ,  Endo/Other  negative endocrine ROS  Renal/GU CRFRenal disease Bladder dysfunction  Urge incontinence     Musculoskeletal  (+) Arthritis , Osteoarthritis and Rheumatoid disorders,    Abdominal Normal abdominal exam  (+)   Peds  Hematology  (+) anemia ,   Anesthesia Other Findings   Reproductive/Obstetrics                            Anesthesia Physical Anesthesia Plan  ASA: 2  Anesthesia Plan: MAC   Post-op Pain Management:    Induction: Intravenous  PONV Risk Score and Plan: 3 and Ondansetron, Aprepitant, Dexamethasone, Propofol infusion and Treatment may vary due to age or medical condition  Airway Management Planned: Simple Face Mask, Natural Airway and Nasal Cannula  Additional Equipment: None  Intra-op Plan:   Post-operative Plan:   Informed Consent: I have reviewed the patients History and Physical, chart, labs and discussed the procedure including the risks, benefits and alternatives for the proposed anesthesia with the patient or authorized representative who has indicated his/her understanding and acceptance.     Dental advisory given  Plan Discussed with: CRNA  Anesthesia Plan Comments: (Lab Results      Component                Value               Date                      WBC                      7.0                  08/14/2021                HGB                      13.0                08/14/2021                HCT                      38.9                08/14/2021                MCV                      89.4                08/14/2021                PLT  208                 08/14/2021           Lab Results      Component                Value               Date                      NA                       141                 08/14/2021                K                        3.7                 08/14/2021                CO2                      27                  08/14/2021                GLUCOSE                  114 (H)             08/14/2021                BUN                      21                  08/14/2021                CREATININE               0.99                08/14/2021                CALCIUM                  9.5                 08/14/2021                EGFR                     50 (L)              07/04/2017                GFRNONAA                 58 (L)              08/14/2021          )       Anesthesia Quick Evaluation

## 2021-08-29 ENCOUNTER — Encounter (HOSPITAL_COMMUNITY): Payer: Self-pay | Admitting: Urology

## 2021-08-29 ENCOUNTER — Ambulatory Visit (HOSPITAL_COMMUNITY): Payer: Medicare Other | Admitting: Physician Assistant

## 2021-08-29 ENCOUNTER — Ambulatory Visit (HOSPITAL_COMMUNITY): Payer: Medicare Other

## 2021-08-29 ENCOUNTER — Ambulatory Visit (HOSPITAL_COMMUNITY): Payer: Medicare Other | Admitting: Anesthesiology

## 2021-08-29 ENCOUNTER — Encounter (HOSPITAL_COMMUNITY)
Admission: RE | Disposition: A | Discharge status: Home / Self Care | Payer: Self-pay | Source: Home / Self Care | Attending: Urology

## 2021-08-29 ENCOUNTER — Ambulatory Visit (HOSPITAL_COMMUNITY)
Admission: RE | Admit: 2021-08-29 | Discharge: 2021-08-29 | Disposition: A | Discharge status: Home / Self Care | Payer: Medicare Other | Attending: Urology | Admitting: Urology

## 2021-08-29 DIAGNOSIS — R112 Nausea with vomiting, unspecified: Secondary | ICD-10-CM | POA: Diagnosis not present

## 2021-08-29 DIAGNOSIS — N281 Cyst of kidney, acquired: Secondary | ICD-10-CM | POA: Insufficient documentation

## 2021-08-29 DIAGNOSIS — D649 Anemia, unspecified: Secondary | ICD-10-CM | POA: Insufficient documentation

## 2021-08-29 DIAGNOSIS — R682 Dry mouth, unspecified: Secondary | ICD-10-CM | POA: Diagnosis not present

## 2021-08-29 DIAGNOSIS — Z87440 Personal history of urinary (tract) infections: Secondary | ICD-10-CM | POA: Insufficient documentation

## 2021-08-29 DIAGNOSIS — N3941 Urge incontinence: Secondary | ICD-10-CM | POA: Diagnosis present

## 2021-08-29 DIAGNOSIS — E559 Vitamin D deficiency, unspecified: Secondary | ICD-10-CM | POA: Diagnosis not present

## 2021-08-29 DIAGNOSIS — K219 Gastro-esophageal reflux disease without esophagitis: Secondary | ICD-10-CM | POA: Diagnosis not present

## 2021-08-29 DIAGNOSIS — Z79899 Other long term (current) drug therapy: Secondary | ICD-10-CM | POA: Insufficient documentation

## 2021-08-29 DIAGNOSIS — M069 Rheumatoid arthritis, unspecified: Secondary | ICD-10-CM | POA: Diagnosis not present

## 2021-08-29 DIAGNOSIS — R350 Frequency of micturition: Secondary | ICD-10-CM | POA: Insufficient documentation

## 2021-08-29 DIAGNOSIS — N3020 Other chronic cystitis without hematuria: Secondary | ICD-10-CM | POA: Insufficient documentation

## 2021-08-29 DIAGNOSIS — R351 Nocturia: Secondary | ICD-10-CM | POA: Insufficient documentation

## 2021-08-29 DIAGNOSIS — I4510 Unspecified right bundle-branch block: Secondary | ICD-10-CM | POA: Diagnosis not present

## 2021-08-29 HISTORY — PX: INTERSTIM IMPLANT PLACEMENT: SHX5130

## 2021-08-29 SURGERY — INSERTION, SACRAL NERVE STIMULATOR, INTERSTIM, STAGE 1
Anesthesia: Monitor Anesthesia Care

## 2021-08-29 MED ORDER — LIDOCAINE-EPINEPHRINE (PF) 1 %-1:200000 IJ SOLN
INTRAMUSCULAR | Status: DC | PRN
  Administered 2021-08-29: 25 mL

## 2021-08-29 MED ORDER — CIPROFLOXACIN IN D5W 400 MG/200ML IV SOLN
400.0000 mg | INTRAVENOUS | Status: AC
  Administered 2021-08-29: 400 mg via INTRAVENOUS
  Filled 2021-08-29: qty 200

## 2021-08-29 MED ORDER — VANCOMYCIN HCL 1500 MG/300ML IV SOLN
1500.0000 mg | INTRAVENOUS | Status: AC
  Administered 2021-08-29: 1500 mg via INTRAVENOUS
  Filled 2021-08-29: qty 300

## 2021-08-29 MED ORDER — LIDOCAINE HCL (PF) 1 % IJ SOLN
INTRAMUSCULAR | Status: AC
  Filled 2021-08-29: qty 30

## 2021-08-29 MED ORDER — ACETAMINOPHEN 10 MG/ML IV SOLN: 1000.0000 mg | Freq: Once | INTRAVENOUS | Status: DC | PRN

## 2021-08-29 MED ORDER — ORAL CARE MOUTH RINSE: 15.0000 mL | Freq: Once | OROMUCOSAL | Status: AC

## 2021-08-29 MED ORDER — CHLORHEXIDINE GLUCONATE 0.12 % MT SOLN
15.0000 mL | Freq: Once | OROMUCOSAL | Status: AC
  Administered 2021-08-29: 15 mL via OROMUCOSAL

## 2021-08-29 MED ORDER — FENTANYL CITRATE PF 50 MCG/ML IJ SOSY: 25.0000 ug | PREFILLED_SYRINGE | INTRAMUSCULAR | Status: DC | PRN

## 2021-08-29 MED ORDER — FENTANYL CITRATE (PF) 100 MCG/2ML IJ SOLN
INTRAMUSCULAR | Status: DC | PRN
  Administered 2021-08-29: 25 ug via INTRAVENOUS
  Administered 2021-08-29: 50 ug via INTRAVENOUS
  Administered 2021-08-29: 25 ug via INTRAVENOUS

## 2021-08-29 MED ORDER — PROPOFOL 1000 MG/100ML IV EMUL
INTRAVENOUS | Status: AC
  Filled 2021-08-29: qty 100

## 2021-08-29 MED ORDER — LIDOCAINE 2% (20 MG/ML) 5 ML SYRINGE
INTRAMUSCULAR | Status: DC | PRN
  Administered 2021-08-29: 80 mg via INTRAVENOUS

## 2021-08-29 MED ORDER — PROPOFOL 500 MG/50ML IV EMUL
INTRAVENOUS | Status: DC | PRN
  Administered 2021-08-29: 50 ug/kg/min via INTRAVENOUS

## 2021-08-29 MED ORDER — LIDOCAINE-EPINEPHRINE (PF) 1 %-1:200000 IJ SOLN
INTRAMUSCULAR | Status: AC
  Filled 2021-08-29: qty 30

## 2021-08-29 MED ORDER — APREPITANT 40 MG PO CAPS
40.0000 mg | ORAL_CAPSULE | Freq: Once | ORAL | Status: AC
  Administered 2021-08-29: 40 mg via ORAL
  Filled 2021-08-29: qty 1

## 2021-08-29 MED ORDER — VANCOMYCIN HCL IN DEXTROSE 1-5 GM/200ML-% IV SOLN: 1000.0000 mg | INTRAVENOUS | Status: DC

## 2021-08-29 MED ORDER — BUPIVACAINE-EPINEPHRINE (PF) 0.5% -1:200000 IJ SOLN
INTRAMUSCULAR | Status: AC
  Filled 2021-08-29: qty 30

## 2021-08-29 MED ORDER — ONDANSETRON HCL 4 MG/2ML IJ SOLN
INTRAMUSCULAR | Status: DC | PRN
  Administered 2021-08-29: 4 mg via INTRAVENOUS

## 2021-08-29 MED ORDER — SULFAMETHOXAZOLE-TRIMETHOPRIM 800-160 MG PO TABS: 1.0000 | ORAL_TABLET | Freq: Two times a day (BID) | ORAL | 0 refills | Status: DC

## 2021-08-29 MED ORDER — PROPOFOL 10 MG/ML IV BOLUS
INTRAVENOUS | Status: AC
  Filled 2021-08-29: qty 20

## 2021-08-29 MED ORDER — FENTANYL CITRATE (PF) 100 MCG/2ML IJ SOLN
INTRAMUSCULAR | Status: AC
  Filled 2021-08-29: qty 2

## 2021-08-29 MED ORDER — LACTATED RINGERS IV SOLN: INTRAVENOUS | Status: DC

## 2021-08-29 MED ORDER — AMISULPRIDE (ANTIEMETIC) 5 MG/2ML IV SOLN: 10.0000 mg | Freq: Once | INTRAVENOUS | Status: DC | PRN

## 2021-08-29 SURGICAL SUPPLY — 45 items
BELT PT INTERSTIM MICRO SYSTEM (MISCELLANEOUS) ×3 IMPLANT
BLADE HEX COATED 2.75 (ELECTRODE) ×3 IMPLANT
BLADE SURG 15 STRL LF DISP TIS (BLADE) ×1 IMPLANT
BLADE SURG 15 STRL SS (BLADE) ×3
CHLORAPREP W/TINT 26 (MISCELLANEOUS) ×5 IMPLANT
COVER PROBE U/S 5X48 (MISCELLANEOUS) ×2 IMPLANT
DERMABOND ADVANCED (GAUZE/BANDAGES/DRESSINGS) ×2
DERMABOND ADVANCED .7 DNX12 (GAUZE/BANDAGES/DRESSINGS) IMPLANT
DRAPE C-ARM 42X120 X-RAY (DRAPES) ×3 IMPLANT
DRAPE C-ARMOR (DRAPES) ×3 IMPLANT
DRAPE INCISE 23X17 IOBAN STRL (DRAPES) ×2
DRAPE INCISE 23X17 STRL (DRAPES) ×1 IMPLANT
DRAPE INCISE IOBAN 23X17 STRL (DRAPES) ×1 IMPLANT
DRAPE INCISE IOBAN 66X45 STRL (DRAPES) ×2 IMPLANT
DRAPE LAPAROSCOPIC ABDOMINAL (DRAPES) ×3 IMPLANT
DRAPE SHEET LG 3/4 BI-LAMINATE (DRAPES) ×2 IMPLANT
DRSG TEGADERM 2-3/8X2-3/4 SM (GAUZE/BANDAGES/DRESSINGS) ×3 IMPLANT
DRSG TEGADERM 4X4.75 (GAUZE/BANDAGES/DRESSINGS) ×4 IMPLANT
DRSG TELFA 3X8 NADH (GAUZE/BANDAGES/DRESSINGS) ×3 IMPLANT
DRSG TELFA PLUS 4X6 ADH ISLAND (GAUZE/BANDAGES/DRESSINGS) ×3 IMPLANT
GAUZE 4X4 16PLY ~~LOC~~+RFID DBL (SPONGE) ×3 IMPLANT
GAUZE SPONGE 4X4 12PLY STRL (GAUZE/BANDAGES/DRESSINGS) ×3 IMPLANT
GLOVE SURG ENC TEXT LTX SZ7.5 (GLOVE) ×6 IMPLANT
GOWN STRL REUS W/TWL XL LVL3 (GOWN DISPOSABLE) ×3 IMPLANT
KIT BASIN OR (CUSTOM PROCEDURE TRAY) ×3 IMPLANT
KIT HANDSET INTERSTIM COMM (NEUROSURGERY SUPPLIES) ×5 IMPLANT
KIT RECHARGE INTERSTIM (NEUROSURGERY SUPPLIES) ×3 IMPLANT
LEAD INTERSTIM 4.32 28 L (Lead) ×2 IMPLANT
NDL FORAMN 5 20GA (NEUROSURGERY SUPPLIES) IMPLANT
NDL HYPO 25X1 1.5 SAFETY (NEEDLE) ×1 IMPLANT
NEEDLE FORAMN 5 20GA (NEUROSURGERY SUPPLIES) ×3 IMPLANT
NEEDLE HYPO 25X1 1.5 SAFETY (NEEDLE) ×3 IMPLANT
NEUROSTIMULATOR 1.7X2X.06 (UROLOGICAL SUPPLIES) ×5 IMPLANT
NS IRRIG 1000ML POUR BTL (IV SOLUTION) ×3 IMPLANT
PACK GENERAL/GYN (CUSTOM PROCEDURE TRAY) ×3 IMPLANT
PAD DRESSING TELFA 3X8 NADH (GAUZE/BANDAGES/DRESSINGS) IMPLANT
STIMULATOR INTERSTIM 2X1.7X.3 (Miscellaneous) ×2 IMPLANT
SUT MNCRL AB 4-0 PS2 18 (SUTURE) ×6 IMPLANT
SUT VIC AB 2-0 UR6 27 (SUTURE) ×6 IMPLANT
SUT VIC AB 3-0 SH 27 (SUTURE) ×6
SUT VIC AB 3-0 SH 27X BRD (SUTURE) ×2 IMPLANT
SUT VIC AB 4-0 PS2 27 (SUTURE) ×6 IMPLANT
SYR CONTROL 10ML LL (SYRINGE) ×3 IMPLANT
TOWEL OR 17X26 10 PK STRL BLUE (TOWEL DISPOSABLE) ×3 IMPLANT
WATER STERILE IRR 1000ML POUR (IV SOLUTION) ×3 IMPLANT

## 2021-08-29 NOTE — Discharge Instructions (Signed)
I have reviewed discharge instructions in detail with the patient. They will follow-up with me or their physician as scheduled. My nurse will also be calling the patients as per protocol.   

## 2021-08-29 NOTE — Interval H&P Note (Signed)
History and Physical Interval Note:  08/29/2021 7:07 AM  Debra Griffith  has presented today for surgery, with the diagnosis of REFRACTORY URGE INCONTINENCE.  The various methods of treatment have been discussed with the patient and family. After consideration of risks, benefits and other options for treatment, the patient has consented to  Procedure(s): INTERSTIM IMPLANT FIRST STAGE (N/A) INTERSTIM IMPLANT SECOND STAGE AND IMPEDANCE CHECK (N/A) as a surgical intervention.  The patient's history has been reviewed, patient examined, no change in status, stable for surgery.  I have reviewed the patient's chart and labs.  Questions were answered to the patient's satisfaction.     [place] A Mouna Yager

## 2021-08-29 NOTE — Transfer of Care (Signed)
Immediate Anesthesia Transfer of Care Note  Patient: Debra Griffith  Procedure(s) Performed: Barrie Lyme IMPLANT FIRST STAGE INTERSTIM IMPLANT SECOND STAGE AND IMPEDANCE CHECK  Patient Location: PACU  Anesthesia Type:MAC  Level of Consciousness: sedated, patient cooperative and responds to stimulation  Airway & Oxygen Therapy: Patient Spontanous Breathing and Patient connected to face mask oxygen  Post-op Assessment: Report given to RN and Post -op Vital signs reviewed and stable  Post vital signs: Reviewed and stable  Last Vitals:  Vitals Value Taken Time  BP 166/86 08/29/21 0900  Temp    Pulse 79 08/29/21 0903  Resp 20 08/29/21 0903  SpO2 100 % 08/29/21 0903  Vitals shown include unvalidated device data.  Last Pain:  Vitals:   08/29/21 0617  TempSrc:   PainSc: 0-No pain         Complications: No notable events documented.

## 2021-08-29 NOTE — Anesthesia Postprocedure Evaluation (Signed)
Anesthesia Post Note  Patient: Debra Griffith  Procedure(s) Performed: Barrie Lyme IMPLANT FIRST STAGE INTERSTIM IMPLANT SECOND STAGE AND IMPEDANCE CHECK     Patient location during evaluation: PACU Anesthesia Type: MAC Level of consciousness: awake and alert Pain management: pain level controlled Vital Signs Assessment: post-procedure vital signs reviewed and stable Respiratory status: spontaneous breathing, nonlabored ventilation, respiratory function stable and patient connected to nasal cannula oxygen Cardiovascular status: stable and blood pressure returned to baseline Postop Assessment: no apparent nausea or vomiting Anesthetic complications: no   No notable events documented.  Last Vitals:  Vitals:   08/29/21 0930 08/29/21 0945  BP:  (!) 159/79  Pulse: 66 67  Resp: 20 13  Temp:    SpO2: 100% 95%    Last Pain:  Vitals:   08/29/21 0945  TempSrc:   PainSc: 0-No pain                 Belenda Cruise P Deazia Lampi

## 2021-08-29 NOTE — Op Note (Signed)
Preoperative diagnosis: Refractory urgency incontinence Postoperative diagnosis refractory urgency incontinence Surgery: Placement of InterStim stage I and stage II and impedance check Surgeon: Dr. Nicki Reaper Annsley Akkerman  The patient has the above diagnosis and consented the above procedure.  Extra care was taken with positioning in the prone position.  Fluoroscopy was utilized.  Preoperative antibiotics were given.  Using fluoroscopy and bony and soft tissue landmarks I negotiated the S3 foramina initially on the right.  I found the S2 and S3 foramina but I could not get any motor responses.  I negotiated positioning for approximately 20 minutes.  I finally went to the left side.  The foramen needle easily passed through the S3 foramina.  She had excellent toe and bellows responses.  Approximately 10 cc of lidocaine epinephrine mixture was used bilaterally.  The guide was placed in the appropriate depth removing the foramen needle.  Scalpel incision was utilized.  White trocar was delivered to the appropriate depth.  Well-prepared lead was advanced to the appropriate depth.  She had excellent tone bellows responses at low amplitude at all 4 settings.  White trocar sheath was removed with inner layer of bleeding.  X-ray in AP and lateral views were taken.  Using soft tissue and bony landmarks I marked a 4 and half centimeter incision in the patient's left upper buttock.  I made a scalpel incision after marking where the implant would go.  I dissected down through the subcutaneous tissue to the appropriate depth.  I made a nice soft tissue pocket.  With the passer I passed the lead from medial to lateral to appropriate depth.  I cleaned it and with sterile water.  It was attached to the generator with screwdriver.  It laid in nicely  As a separate procedure impedance was checked and was normal.  I closed the midline incision with interrupted 4-0 Vicryl.  I closed the left buttock incision with 3-0 Vicryl  subcutaneous followed by 4-0 Monocryl subcuticular.  Sterile dressing was applied  Was very pleased with the surgery and hopefully this reaches the patient's treatment well.

## 2021-08-30 ENCOUNTER — Encounter (HOSPITAL_COMMUNITY): Payer: Self-pay | Admitting: Urology

## 2021-09-01 NOTE — H&P (Signed)
I was consulted by the above provider to assess the patient's worsening urinary incontinence last 6 months. She has urge incontinence. She has typical triggering going from a sitting to standing position. She has foot on the floor syndrome the middle of the night. She denies stress incontinence and bedwetting. She wears 2 heavy pads at night and can soak multiple pads up to 6 a day.   She gets 6 bladder infections year responding to antibiotics but not so much in the last year or so.   Clinically patient has urge incontinence with typical triggers. When she timed voids she does a bit better. She has moderate frequency and nocturia.  My culture was positive. Treated with Keflex. Ultrasound normal except for renal cyst. She was given Diflucan for possible vaginitis. She was put on daily Keflex   I found the conversation difficult today but I think the patient was doing well on daily Keflex but may be getting send yeast infections. Today she does not think she has 1. She did have labial redness and or ulcers at 1 time apparently. I think she is about 50% better on oxybutynin Keflex. She actually stop the once a day antibiotic a few weeks ago and then 2 days ago she started having burning went back on it.   I thought it was best to switch her to daily Macrodantin because of the yeast tissue. I offered Myrbetriq 50 mg samples and prescription   We will call in Macrodantin 100 mg 90 tablets and 3 refills. We will give her Myrbetriq 25 mg samples and prescription and see me in 5 weeks. We can always go back on the oxybutynin. I found any the conversation circular   I believe the patient has gone back and taken Myrbetriq with the oxybutynin and is doing dramatically better with much less urge incontinence.  She says she still has yeast issues and she has had this ongoing. She understands it is uncommon for the occur with Macrodantin.  On April 14th she was called in 3 days of Diflucan. She has an odor. I  will have her see 1 of the gynecologists since this seems to be a chronic issue.  Patient transiently has less itching with Diflucan then symptoms return. She understands Macrodantin rarely if ever could ever cause a yeast infection. Stay on both overactive bladder medications and Macrodantin reassess durability in 4 months   Urge incontinence better but still has near foot on the floor syndrome when she gets on a cold bathroom floor.  We talked about percutaneous tibial nerve stimulation and handout given and she like to proceed  Oxybutynin Myrbetriq prescription 90 x 3 sent. Handout given. PTNS ordered. If she does well we may be able to wean off some of her overactive bladder medication.   Myrbetriq did not help. On oxybutynin. On Macrodantin very pleased with no infections. Failed percutaneous tibial nerve stimulation. She can wear up to tender 12 pads a day. cold air is another trigger  pathophysiology of incontinence discussed. Because the severity and refractory nature ordered urodynamics. By history of already done cystoscopy. Obesity may limit some treatments and she could have trouble catheterize in. Will proceed accordingly. I will ask her she can lay on her stomach regarding InterStim   Today  Incontinence stable. Frequency stable. She did not void was catheterized for 100 mL. Maximum bladder capacity was 323 mL. Bladder was stable. She had increased bladder sensation. No stress incontinence with Valsalva pressure 78 cm of water. During  voluntary voiding she voided 279 mL with a maximum flow of 17 mL/second. Maximum voiding pressure 26 cm water. Residual 43 mL. EMG activity decreased during the voiding phase. Bladder neck descended 1 cm. Bladder was mildly trabeculated. The details of the urodynamics are signed and dictated   I went over Botox and InterStim with full template. She says she does have some trouble laying on her stomach. I stood her up and we may need to use a longer foramen  needle. She is not fearful of either treatment but is a bit concerned about the retention issue. She would need to have a catheter with various trial of voidings. She would like to try the test stimulation for refractory urge incontinence affecting her quality life. This will be done under IV sedation the surgery center. Both handouts were given.   Having a tremendous time a dry mouth and dental issues so will stop the oxybutynin that may not be helping. Call if culture positive. Renewed daily Macrodantin which she had run out of 90 x 3   07/31/2021: She underwent PNE presents today for PNE removal and follow-up. She reports that she feels the system was very beneficial and greatly decreased her urinary leakage and urgency as well as frequency. Like to pursue a more permanent InterStim.     ALLERGIES: Morphine - sensitivity    MEDICATIONS: Macrobid 100 mg capsule 1 capsule PO Daily  Metoprolol Tartrate  Oxybutynin Chloride Er 10 mg tablet, extended release 24 hr 2 tablet PO Daily  Amlodipine Besylate  Anastrozole  Children's Aspirin  Clonazepam  Cyclobenzaprine Hcl  Hydrocodone Bitartrate  Triamterene  Valsartan     GU PSH: Complex cystometrogram, w/ void pressure and urethral pressure profile studies, any technique - 05/24/2021 Complex Uroflow - 05/24/2021 Cystoscopy - 2019 Emg surf Electrd - 05/24/2021 Inject For cystogram - 05/24/2021 Intrabd voidng Press - 05/24/2021     NON-GU PSH: Breast lumpectomy - 2017 Cataract surgery - 2018 Eye Surgery (Unspecified), Retina - 2018 Hip Replacement - 2013 Knee replacement, 2014 and 2016 - 2016 Neuroeltrd Stim Post Tibial - 02/23/2021, 01/26/2021, 12/29/2020, 11/24/2020, 10/27/2020, 09/29/2020, 09/01/2020, 08/30/2020, 08/25/2020, 08/23/2020, 08/16/2020, 08/09/2020, 08/02/2020, 07/19/2020, 07/12/2020, 07/05/2020, 06/28/2020, 06/21/2020     GU PMH: Chronic cystitis (w/o hematuria) - 06/20/2021, - 03/30/2021, - 05/31/2020, - 08/12/2019 Urge incontinence -  06/20/2021, - 05/24/2021, - 03/30/2021, - 02/23/2021, - 01/26/2021, - 12/29/2020, - 11/24/2020, - 10/27/2020, - 09/29/2020, - 09/01/2020, - 08/30/2020, - 08/25/2020, - 08/23/2020, - 08/16/2020, - 08/09/2020, - 08/02/2020, - 07/19/2020, - 07/12/2020, - 07/05/2020, - 06/28/2020, - 06/21/2020, - 05/31/2020, - 2019 Urinary Frequency - 05/24/2021, - 02/23/2021, - 01/26/2021, - 12/29/2020, - 11/24/2020, - 10/27/2020, - 09/29/2020, - 09/01/2020, - 08/30/2020, - 08/25/2020, - 08/23/2020, - 08/16/2020, - 08/09/2020, - 08/02/2020, - 07/19/2020, - 07/12/2020, - 07/05/2020, - 06/28/2020, - 06/21/2020, - 2019 Oth urogenital candidiasis - 08/12/2019 Candidiasis of vulva and vagina - 2019 Obstructive and reflux uropathy, Unspec - 2019 Nocturia - 2019 Urinary Tract Inf, Unspec site - 2019      PMH Notes: Cancer, Unspecified   NON-GU PMH: Anxiety Arthritis Depression Sleep Apnea    FAMILY HISTORY: Arthritis - Other Blood In Urine - Mother father deceased - Other Glaucoma - Father Heart Disease - Father Kidney Stones - Father mother deceased - Other Prostate Cancer - Father   SOCIAL HISTORY: Marital Status: Married Current Smoking Status: Patient has never smoked.   Tobacco Use Assessment Completed: Used Tobacco in last 30 days? Has never drank.  Drinks 1  caffeinated drink per day. Patient's occupation is/was Retired.    REVIEW OF SYSTEMS:    GU Review Female:   Patient denies frequent urination, hard to postpone urination, burning /pain with urination, get up at night to urinate, leakage of urine, stream starts and stops, trouble starting your stream, have to strain to urinate, and being pregnant.  Gastrointestinal (Upper):   Patient denies nausea, vomiting, and indigestion/ heartburn.  Gastrointestinal (Lower):   Patient denies diarrhea and constipation.  Constitutional:   Patient denies fever, night sweats, weight loss, and fatigue.  Musculoskeletal:   Patient denies back pain and joint pain.  Neurological:   Patient  denies dizziness and headaches.  Psychologic:   Patient denies depression and anxiety.   VITAL SIGNS: None   GU PHYSICAL EXAMINATION:      Notes: PNE leads were removed without difficulty. Minimal bleeding. Site was bandaged.   MULTI-SYSTEM PHYSICAL EXAMINATION:    Constitutional: Obese. No physical deformities. Normally developed. Good grooming.      Complexity of Data:  Source Of History:  Patient  Records Review:   Previous Doctor Records, Previous Patient Records   PROCEDURES: None

## 2021-10-16 DIAGNOSIS — G894 Chronic pain syndrome: Secondary | ICD-10-CM | POA: Diagnosis not present

## 2021-10-16 DIAGNOSIS — M5459 Other low back pain: Secondary | ICD-10-CM | POA: Diagnosis not present

## 2021-10-18 DIAGNOSIS — Z5181 Encounter for therapeutic drug level monitoring: Secondary | ICD-10-CM | POA: Diagnosis not present

## 2021-10-18 DIAGNOSIS — Z79899 Other long term (current) drug therapy: Secondary | ICD-10-CM | POA: Diagnosis not present

## 2021-10-23 DIAGNOSIS — R350 Frequency of micturition: Secondary | ICD-10-CM | POA: Diagnosis not present

## 2021-11-20 DIAGNOSIS — N3020 Other chronic cystitis without hematuria: Secondary | ICD-10-CM | POA: Diagnosis not present

## 2021-11-30 ENCOUNTER — Other Ambulatory Visit: Payer: Self-pay

## 2021-11-30 ENCOUNTER — Ambulatory Visit (INDEPENDENT_AMBULATORY_CARE_PROVIDER_SITE_OTHER): Payer: Medicare Other | Admitting: Ophthalmology

## 2021-11-30 ENCOUNTER — Encounter (INDEPENDENT_AMBULATORY_CARE_PROVIDER_SITE_OTHER): Payer: Self-pay | Admitting: Ophthalmology

## 2021-11-30 ENCOUNTER — Encounter (INDEPENDENT_AMBULATORY_CARE_PROVIDER_SITE_OTHER): Payer: Medicare Other | Admitting: Ophthalmology

## 2021-11-30 DIAGNOSIS — H353212 Exudative age-related macular degeneration, right eye, with inactive choroidal neovascularization: Secondary | ICD-10-CM | POA: Diagnosis not present

## 2021-11-30 DIAGNOSIS — H35722 Serous detachment of retinal pigment epithelium, left eye: Secondary | ICD-10-CM | POA: Diagnosis not present

## 2021-11-30 DIAGNOSIS — H353132 Nonexudative age-related macular degeneration, bilateral, intermediate dry stage: Secondary | ICD-10-CM

## 2021-11-30 DIAGNOSIS — H353123 Nonexudative age-related macular degeneration, left eye, advanced atrophic without subfoveal involvement: Secondary | ICD-10-CM

## 2021-11-30 NOTE — Progress Notes (Signed)
? ? ?11/30/2021 ? ?  ? ?CHIEF COMPLAINT ?Patient presents for  ?Chief Complaint  ?Patient presents with  ? Macular Degeneration  ? ? ? ? ?HISTORY OF PRESENT ILLNESS: ?Debra Griffith is a 81 y.o. female who presents to the clinic today for:  ? ?HPI   ?6 mos fu oct fp. ?Pt states, "when I look at works, it starts off as straight and then gets wiggly. One time ive seen a woman on tv making a pie and I saw two pies on the screen." Pt confirms she sees double vision. ?Pt denies FOL and sees floaters here and there.  ?Pt states no change in medical history.  ?Last edited by Silvestre Moment on 11/30/2021  3:22 PM.  ?  ? ? ?Referring physician: ?Josetta Huddle, MD ?[facility] ?[address] ?[place],  [place] [address] ? ?HISTORICAL INFORMATION:  ? ?Selected notes from the [facility] ?  ?   ? ?CURRENT MEDICATIONS: ?No current outpatient medications on file. (Ophthalmic Drugs)  ? ?No current facility-administered medications for this visit. (Ophthalmic Drugs)  ? ?Current Outpatient Medications (Other)  ?Medication Sig  ? aspirin EC 81 MG tablet Take 81 mg by mouth daily. Swallow whole.  ? Cholecalciferol (VITAMIN D) 125 MCG (5000 UT) CAPS Take 5,000 Units by mouth daily.  ? clonazePAM (KLONOPIN) 1 MG tablet Take 1 mg by mouth 2 (two) times daily.  ? Cyanocobalamin (B-12) 2500 MCG TABS Take 2,500 mcg by mouth daily.  ? cyclobenzaprine (FLEXERIL) 10 MG tablet Take 20 mg by mouth at bedtime.  ? fluorouracil (EFUDEX) 5 % cream Apply 1 application topically daily as needed (dark spots).  ? HYDROcodone-acetaminophen (NORCO) 10-325 MG tablet Take 1 tablet by mouth 4 (four) times daily as needed for moderate pain.  ? metoprolol tartrate (LOPRESSOR) 25 MG tablet Take 25 mg by mouth 2 (two) times daily.   ? Multiple Vitamins-Minerals (PRESERVISION AREDS 2+MULTI VIT PO) Take 1 capsule by mouth in the morning and at bedtime.  ? nitrofurantoin (MACRODANTIN) 100 MG capsule Take 1 capsule (100 mg total) by mouth at bedtime.  ? OVER THE COUNTER  MEDICATION Take 1 tablet by mouth daily. Bone up  ? OVER THE COUNTER MEDICATION Take 2 tablets by mouth daily as needed (constipation). Swiss Kriss  ? rosuvastatin (CRESTOR) 5 MG tablet Take 5 mg by mouth daily.  ? sulfamethoxazole-trimethoprim (BACTRIM DS) 800-160 MG tablet Take 1 tablet by mouth 2 (two) times daily.  ? triamterene-hydrochlorothiazide (MAXZIDE-25) 37.5-25 MG tablet Take 1 tablet by mouth daily.  ? valsartan (DIOVAN) 320 MG tablet Take 320 mg by mouth daily.  ? venlafaxine XR (EFFEXOR-XR) 75 MG 24 hr capsule Take 1 capsule (75 mg total) by mouth 2 (two) times daily.  ? zolpidem (AMBIEN) 10 MG tablet Take 10 mg by mouth at bedtime.  ? ?No current facility-administered medications for this visit. (Other)  ? ?Facility-Administered Medications Ordered in Other Visits (Other)  ?Medication Route  ? methocarbamol (ROBAXIN) tablet 500 mg Oral  ? metroNIDAZOLE (FLAGYL) tablet 500 mg Oral  ? ondansetron (ZOFRAN-ODT) disintegrating tablet 4 mg Oral  ? Or  ? ondansetron (ZOFRAN) 4 mg in sodium chloride 0.9 % 50 mL IVPB Intravenous  ? oxyCODONE (Oxy IR/ROXICODONE) immediate release tablet 5-10 mg Oral  ? simethicone (MYLICON) chewable tablet 40 mg Oral  ? ? ? ? ?REVIEW OF SYSTEMS: ?ROS   ?Negative for: Constitutional, Gastrointestinal, Neurological, Skin, Genitourinary, Musculoskeletal, HENT, Endocrine, Cardiovascular, Eyes, Respiratory, Psychiatric, Allergic/Imm, Heme/Lymph ?Last edited by Gaylyn Cheers,  [place] on 11/30/2021  3:22 PM.  ?  ? ? ? ?ALLERGIES ?Allergies  ?Allergen Reactions  ? Morphine And Related Other (See Comments)  ?  hallucinations  ? ? ?PAST MEDICAL HISTORY ?Past Medical History:  ?Diagnosis Date  ? Anemia   ? hx of   ? Anxiety   ? Breast cancer in female [facility]) 03/2016  ? left  ? Chronic bronchitis ([place])   ? FLARE UPS USUALLY ONCE A YEAR  ? Chronic lower back pain   ? Complication of anesthesia   ? trouble waking up   ? CRI (chronic renal insufficiency) 07/27/2016  ? DDD (degenerative disc disease), lumbar    ? Depression   ? GERD (gastroesophageal reflux disease)   ? History of recent fall   ? "twice on Sunday; once yesterday" (08/29/2016)  ? Hypertension   ? Inflammatory arthritis 07/27/2016  ? Sero Negative, Positive Synovitis hands, [place]  ? Insomnia   ? Irregular heart beats   ? Macular degeneration of right eye   ? New onset atrial fibrillation ([place])   ? OA (osteoarthritis) of hip 06/24/2012  ? OA (osteoarthritis) of knee 11/02/2013  ? L knee   ? Obesity   ? Osteoarthritis of both feet 07/27/2016  ? Osteoarthritis of both hands 07/27/2016  ? PONV (postoperative nausea and vomiting)   ? SEVERE N&V AND DIARRHEA AFTER HYSTERECTOMY AND AFTER WISDOM TEETH EXTRACTIONS - NO PROBLEMS WITH LAST 2 SURGERIES - THE HIP AND LEFT KNEE  ? Postop Acute blood loss anemia 06/25/2012  ? Postop Hyponatremia 06/25/2012  ? Postop Sinus tachycardia 06/27/2012  ? RBBB (right bundle branch block) 07/27/2016  ? Rheumatoid arthritis ([place])   ? "doctor recently took me off all RX for this" (08/02/2016)  ? Right bundle branch block   ? Right bundle branch block   ? S/p dental crown   ? dental crowns on every tooth  ? Status post total bilateral knee replacement 11/13/2013  ? Vitamin D deficiency   ? ?Past Surgical History:  ?Procedure Laterality Date  ? AXILLARY SURGERY Left 06/25/2016  ? Aspiration of left axillary seroma   ? BREAST BIOPSY Left 03/2016  ? BREAST LUMPECTOMY WITH RADIOACTIVE SEED AND SENTINEL LYMPH NODE BIOPSY Left 06/13/2016  ? Procedure: LEFT BREAST LUMPECTOMY WITH BRACKETED  RADIOACTIVE SEED AND SENTINEL LYMPH NODE BIOPSY;  Surgeon: Rolm Bookbinder, MD;  Location: [facility];  Service: General;  Laterality: Left;  ? BREAST RECONSTRUCTION Bilateral 06/25/2016  ? BILATERAL ONCOPLASTIC BREAST RECONSTRUCTION WITH BREAST REDUCTION  ? BREAST RECONSTRUCTION WITH PLACEMENT OF TISSUE EXPANDER AND FLEX HD (ACELLULAR HYDRATED DERMIS) Bilateral 06/25/2016  ? Procedure: BILATERAL ONCOPLASTIC BREAST RECONSTRUCTION WITH BREAST REDUCTION, Aspiration  of left axillary seroma;  Surgeon: Irene Limbo, MD;  Location: [facility];  Service: Plastics;  Laterality: Bilateral;  ? BREAST REDUCTION SURGERY Bilateral 06/25/2016  ? Procedure: MAMMARY REDUCTION  (BREAST) BILATERAL;  Surgeon: Irene Limbo, MD;  Location: [facility];  Service: Plastics;  Laterality: Bilateral;  ? CESAREAN SECTION  1966; 1971; 1973  ? COLONOSCOPY    ? DEBRIDEMENT AND CLOSURE WOUND Right 07/11/2016  ? Procedure: DEBRIDEMENT LEFT BREAST;  Surgeon: Irene Limbo, MD;  Location: [facility];  Service: Plastics;  Laterality: Right;  ? EYE SURGERY    ? INTERSTIM IMPLANT PLACEMENT N/A 08/29/2021  ? Procedure: INTERSTIM IMPLANT FIRST STAGE;  Surgeon: Bjorn Loser, MD;  Location: [facility];  Service: Urology;  Laterality: N/A;  ? INTERSTIM IMPLANT PLACEMENT N/A 08/29/2021  ? Procedure: INTERSTIM IMPLANT SECOND STAGE AND  IMPEDANCE CHECK;  Surgeon: Bjorn Loser, MD;  Location: [facility];  Service: Urology;  Laterality: N/A;  ? JOINT REPLACEMENT    ? KNEE ARTHROSCOPY Right   ? "before replacement"  ? MASTECTOMY COMPLETE / SIMPLE Left 08/02/2016  ? total  ? REDUCTION MAMMAPLASTY Bilateral 06/25/2016  ? TOTAL ABDOMINAL HYSTERECTOMY W/ BILATERAL SALPINGOOPHORECTOMY    ? TOTAL HIP ARTHROPLASTY  06/24/2012  ? Procedure: TOTAL HIP ARTHROPLASTY;  Surgeon: [name], MD;  Location: [facility];  Service: Orthopedics;  Laterality: Left;  ? TOTAL KNEE ARTHROPLASTY N/A 11/02/2013  ? Procedure: LEFT TOTAL KNEE ARTHROPLASTY WITH RIGHT KNEE CORTISONE INJECTION;  Surgeon: [name], MD;  Location: [facility];  Service: Orthopedics;  Laterality: N/A;  ? TOTAL KNEE ARTHROPLASTY Right 05/31/2014  ? Procedure: RIGHT TOTAL KNEE ARTHROPLASTY;  Surgeon: [name], MD;  Location: [facility];  Service: Orthopedics;  Laterality: Right;  ? TOTAL MASTECTOMY Left 08/02/2016  ? Procedure: LEFT TOTAL MASTECTOMY;  Surgeon: Rolm Bookbinder, MD;  Location: [facility];  Service: General;  Laterality: Left;  ? [place]  ? WISDOM  TOOTH EXTRACTION  1970's  ? admitted to hospital for surgery  ? ? ?FAMILY HISTORY ?Family History  ?Problem Relation Age of Onset  ? Hypertension Mother   ? Prostate cancer Father   ? Hypertension F

## 2021-11-30 NOTE — Assessment & Plan Note (Signed)
On foveal atrophy yet still accounts for some component of vision ?

## 2021-11-30 NOTE — Assessment & Plan Note (Signed)
No signs of recurrent CNVM OD 

## 2021-11-30 NOTE — Assessment & Plan Note (Signed)
Complete resolution post vitrectomy and release of VMT ?

## 2022-02-14 DIAGNOSIS — G894 Chronic pain syndrome: Secondary | ICD-10-CM | POA: Diagnosis not present

## 2022-03-26 ENCOUNTER — Other Ambulatory Visit: Payer: Self-pay | Admitting: Internal Medicine

## 2022-03-26 DIAGNOSIS — Z1231 Encounter for screening mammogram for malignant neoplasm of breast: Secondary | ICD-10-CM

## 2022-04-16 DIAGNOSIS — E559 Vitamin D deficiency, unspecified: Secondary | ICD-10-CM | POA: Diagnosis not present

## 2022-04-16 DIAGNOSIS — G4700 Insomnia, unspecified: Secondary | ICD-10-CM | POA: Diagnosis not present

## 2022-04-16 DIAGNOSIS — R251 Tremor, unspecified: Secondary | ICD-10-CM | POA: Diagnosis not present

## 2022-04-16 DIAGNOSIS — N390 Urinary tract infection, site not specified: Secondary | ICD-10-CM | POA: Diagnosis not present

## 2022-04-16 DIAGNOSIS — E782 Mixed hyperlipidemia: Secondary | ICD-10-CM | POA: Diagnosis not present

## 2022-04-16 DIAGNOSIS — G4733 Obstructive sleep apnea (adult) (pediatric): Secondary | ICD-10-CM | POA: Diagnosis not present

## 2022-04-16 DIAGNOSIS — I10 Essential (primary) hypertension: Secondary | ICD-10-CM | POA: Diagnosis not present

## 2022-04-16 DIAGNOSIS — Z0000 Encounter for general adult medical examination without abnormal findings: Secondary | ICD-10-CM | POA: Diagnosis not present

## 2022-05-03 ENCOUNTER — Ambulatory Visit
Admission: RE | Admit: 2022-05-03 | Discharge: 2022-05-03 | Disposition: A | Discharge status: Home / Self Care | Payer: Medicare Other | Source: Ambulatory Visit | Attending: Internal Medicine | Admitting: Internal Medicine

## 2022-05-03 ENCOUNTER — Ambulatory Visit: Payer: Medicare Other

## 2022-05-03 DIAGNOSIS — Z1231 Encounter for screening mammogram for malignant neoplasm of breast: Secondary | ICD-10-CM

## 2022-05-17 ENCOUNTER — Ambulatory Visit: Payer: Self-pay

## 2022-05-17 NOTE — Patient Outreach (Signed)
  Care Coordination   05/17/2022 Name: Debra Griffith MRN: [id number] DOB: November 25, 1940   Care Coordination Outreach Attempts:  An unsuccessful telephone outreach was attempted today to offer the patient information about available care coordination services as a benefit of their health plan.   Follow Up Plan:  Additional outreach attempts will be made to offer the patient care coordination information and services.   Encounter Outcome:  No Answer  Care Coordination Interventions Activated:  No   Care Coordination Interventions:  No, not indicated    [facility] [phone]

## 2022-05-18 ENCOUNTER — Ambulatory Visit: Payer: Self-pay

## 2022-06-19 DIAGNOSIS — G894 Chronic pain syndrome: Secondary | ICD-10-CM | POA: Diagnosis not present

## 2022-06-19 DIAGNOSIS — M5459 Other low back pain: Secondary | ICD-10-CM | POA: Diagnosis not present

## 2022-06-19 DIAGNOSIS — Z5181 Encounter for therapeutic drug level monitoring: Secondary | ICD-10-CM | POA: Diagnosis not present

## 2022-06-19 DIAGNOSIS — T402X5A Adverse effect of other opioids, initial encounter: Secondary | ICD-10-CM | POA: Diagnosis not present

## 2022-08-14 DIAGNOSIS — I10 Essential (primary) hypertension: Secondary | ICD-10-CM | POA: Diagnosis not present

## 2022-08-14 DIAGNOSIS — G894 Chronic pain syndrome: Secondary | ICD-10-CM | POA: Diagnosis not present

## 2022-08-14 DIAGNOSIS — R296 Repeated falls: Secondary | ICD-10-CM | POA: Diagnosis not present

## 2022-08-17 DIAGNOSIS — G4733 Obstructive sleep apnea (adult) (pediatric): Secondary | ICD-10-CM | POA: Diagnosis not present

## 2022-08-17 DIAGNOSIS — R251 Tremor, unspecified: Secondary | ICD-10-CM | POA: Diagnosis not present

## 2022-08-17 DIAGNOSIS — I872 Venous insufficiency (chronic) (peripheral): Secondary | ICD-10-CM | POA: Diagnosis not present

## 2022-08-17 DIAGNOSIS — E782 Mixed hyperlipidemia: Secondary | ICD-10-CM | POA: Diagnosis not present

## 2022-08-17 DIAGNOSIS — R296 Repeated falls: Secondary | ICD-10-CM | POA: Diagnosis not present

## 2022-08-17 DIAGNOSIS — I10 Essential (primary) hypertension: Secondary | ICD-10-CM | POA: Diagnosis not present

## 2022-08-17 DIAGNOSIS — M549 Dorsalgia, unspecified: Secondary | ICD-10-CM | POA: Diagnosis not present

## 2022-08-17 DIAGNOSIS — Z9181 History of falling: Secondary | ICD-10-CM | POA: Diagnosis not present

## 2022-08-17 DIAGNOSIS — J45909 Unspecified asthma, uncomplicated: Secondary | ICD-10-CM | POA: Diagnosis not present

## 2022-08-17 DIAGNOSIS — M159 Polyosteoarthritis, unspecified: Secondary | ICD-10-CM | POA: Diagnosis not present

## 2022-08-17 DIAGNOSIS — I4891 Unspecified atrial fibrillation: Secondary | ICD-10-CM | POA: Diagnosis not present

## 2022-08-17 DIAGNOSIS — G894 Chronic pain syndrome: Secondary | ICD-10-CM | POA: Diagnosis not present

## 2022-08-17 DIAGNOSIS — I4510 Unspecified right bundle-branch block: Secondary | ICD-10-CM | POA: Diagnosis not present

## 2022-08-17 DIAGNOSIS — M109 Gout, unspecified: Secondary | ICD-10-CM | POA: Diagnosis not present

## 2022-08-17 DIAGNOSIS — Z79891 Long term (current) use of opiate analgesic: Secondary | ICD-10-CM | POA: Diagnosis not present

## 2022-08-17 DIAGNOSIS — Z7969 Long term (current) use of other immunomodulators and immunosuppressants: Secondary | ICD-10-CM | POA: Diagnosis not present

## 2022-08-17 DIAGNOSIS — D492 Neoplasm of unspecified behavior of bone, soft tissue, and skin: Secondary | ICD-10-CM | POA: Diagnosis not present

## 2022-08-17 DIAGNOSIS — E559 Vitamin D deficiency, unspecified: Secondary | ICD-10-CM | POA: Diagnosis not present

## 2022-08-17 DIAGNOSIS — Z7982 Long term (current) use of aspirin: Secondary | ICD-10-CM | POA: Diagnosis not present

## 2022-08-21 DIAGNOSIS — L718 Other rosacea: Secondary | ICD-10-CM | POA: Diagnosis not present

## 2022-08-21 DIAGNOSIS — L538 Other specified erythematous conditions: Secondary | ICD-10-CM | POA: Diagnosis not present

## 2022-08-21 DIAGNOSIS — L298 Other pruritus: Secondary | ICD-10-CM | POA: Diagnosis not present

## 2022-08-21 DIAGNOSIS — C44212 Basal cell carcinoma of skin of right ear and external auricular canal: Secondary | ICD-10-CM | POA: Diagnosis not present

## 2022-08-21 DIAGNOSIS — I872 Venous insufficiency (chronic) (peripheral): Secondary | ICD-10-CM | POA: Diagnosis not present

## 2022-08-21 DIAGNOSIS — L821 Other seborrheic keratosis: Secondary | ICD-10-CM | POA: Diagnosis not present

## 2022-08-21 DIAGNOSIS — C792 Secondary malignant neoplasm of skin: Secondary | ICD-10-CM | POA: Diagnosis not present

## 2022-08-21 DIAGNOSIS — D485 Neoplasm of uncertain behavior of skin: Secondary | ICD-10-CM | POA: Diagnosis not present

## 2022-08-21 DIAGNOSIS — D2371 Other benign neoplasm of skin of right lower limb, including hip: Secondary | ICD-10-CM | POA: Diagnosis not present

## 2022-08-21 DIAGNOSIS — L820 Inflamed seborrheic keratosis: Secondary | ICD-10-CM | POA: Diagnosis not present

## 2022-08-22 DIAGNOSIS — Z7969 Long term (current) use of other immunomodulators and immunosuppressants: Secondary | ICD-10-CM | POA: Diagnosis not present

## 2022-08-22 DIAGNOSIS — R251 Tremor, unspecified: Secondary | ICD-10-CM | POA: Diagnosis not present

## 2022-08-22 DIAGNOSIS — D492 Neoplasm of unspecified behavior of bone, soft tissue, and skin: Secondary | ICD-10-CM | POA: Diagnosis not present

## 2022-08-22 DIAGNOSIS — I4510 Unspecified right bundle-branch block: Secondary | ICD-10-CM | POA: Diagnosis not present

## 2022-08-22 DIAGNOSIS — M159 Polyosteoarthritis, unspecified: Secondary | ICD-10-CM | POA: Diagnosis not present

## 2022-08-22 DIAGNOSIS — I4891 Unspecified atrial fibrillation: Secondary | ICD-10-CM | POA: Diagnosis not present

## 2022-08-22 DIAGNOSIS — Z9181 History of falling: Secondary | ICD-10-CM | POA: Diagnosis not present

## 2022-08-22 DIAGNOSIS — Z7982 Long term (current) use of aspirin: Secondary | ICD-10-CM | POA: Diagnosis not present

## 2022-08-22 DIAGNOSIS — M549 Dorsalgia, unspecified: Secondary | ICD-10-CM | POA: Diagnosis not present

## 2022-08-22 DIAGNOSIS — J45909 Unspecified asthma, uncomplicated: Secondary | ICD-10-CM | POA: Diagnosis not present

## 2022-08-22 DIAGNOSIS — E559 Vitamin D deficiency, unspecified: Secondary | ICD-10-CM | POA: Diagnosis not present

## 2022-08-22 DIAGNOSIS — Z79891 Long term (current) use of opiate analgesic: Secondary | ICD-10-CM | POA: Diagnosis not present

## 2022-08-22 DIAGNOSIS — R296 Repeated falls: Secondary | ICD-10-CM | POA: Diagnosis not present

## 2022-08-22 DIAGNOSIS — I872 Venous insufficiency (chronic) (peripheral): Secondary | ICD-10-CM | POA: Diagnosis not present

## 2022-08-22 DIAGNOSIS — M109 Gout, unspecified: Secondary | ICD-10-CM | POA: Diagnosis not present

## 2022-08-22 DIAGNOSIS — I10 Essential (primary) hypertension: Secondary | ICD-10-CM | POA: Diagnosis not present

## 2022-08-22 DIAGNOSIS — E782 Mixed hyperlipidemia: Secondary | ICD-10-CM | POA: Diagnosis not present

## 2022-08-22 DIAGNOSIS — G894 Chronic pain syndrome: Secondary | ICD-10-CM | POA: Diagnosis not present

## 2022-08-22 DIAGNOSIS — G4733 Obstructive sleep apnea (adult) (pediatric): Secondary | ICD-10-CM | POA: Diagnosis not present

## 2022-08-24 DIAGNOSIS — M109 Gout, unspecified: Secondary | ICD-10-CM | POA: Diagnosis not present

## 2022-08-24 DIAGNOSIS — I10 Essential (primary) hypertension: Secondary | ICD-10-CM | POA: Diagnosis not present

## 2022-08-24 DIAGNOSIS — M159 Polyosteoarthritis, unspecified: Secondary | ICD-10-CM | POA: Diagnosis not present

## 2022-08-24 DIAGNOSIS — E782 Mixed hyperlipidemia: Secondary | ICD-10-CM | POA: Diagnosis not present

## 2022-08-24 DIAGNOSIS — I4510 Unspecified right bundle-branch block: Secondary | ICD-10-CM | POA: Diagnosis not present

## 2022-08-24 DIAGNOSIS — J45909 Unspecified asthma, uncomplicated: Secondary | ICD-10-CM | POA: Diagnosis not present

## 2022-08-24 DIAGNOSIS — Z79891 Long term (current) use of opiate analgesic: Secondary | ICD-10-CM | POA: Diagnosis not present

## 2022-08-24 DIAGNOSIS — R296 Repeated falls: Secondary | ICD-10-CM | POA: Diagnosis not present

## 2022-08-24 DIAGNOSIS — Z9181 History of falling: Secondary | ICD-10-CM | POA: Diagnosis not present

## 2022-08-24 DIAGNOSIS — I872 Venous insufficiency (chronic) (peripheral): Secondary | ICD-10-CM | POA: Diagnosis not present

## 2022-08-24 DIAGNOSIS — E559 Vitamin D deficiency, unspecified: Secondary | ICD-10-CM | POA: Diagnosis not present

## 2022-08-24 DIAGNOSIS — Z7982 Long term (current) use of aspirin: Secondary | ICD-10-CM | POA: Diagnosis not present

## 2022-08-24 DIAGNOSIS — I4891 Unspecified atrial fibrillation: Secondary | ICD-10-CM | POA: Diagnosis not present

## 2022-08-24 DIAGNOSIS — G4733 Obstructive sleep apnea (adult) (pediatric): Secondary | ICD-10-CM | POA: Diagnosis not present

## 2022-08-24 DIAGNOSIS — Z7969 Long term (current) use of other immunomodulators and immunosuppressants: Secondary | ICD-10-CM | POA: Diagnosis not present

## 2022-08-24 DIAGNOSIS — D492 Neoplasm of unspecified behavior of bone, soft tissue, and skin: Secondary | ICD-10-CM | POA: Diagnosis not present

## 2022-08-24 DIAGNOSIS — M549 Dorsalgia, unspecified: Secondary | ICD-10-CM | POA: Diagnosis not present

## 2022-08-24 DIAGNOSIS — R251 Tremor, unspecified: Secondary | ICD-10-CM | POA: Diagnosis not present

## 2022-08-24 DIAGNOSIS — G894 Chronic pain syndrome: Secondary | ICD-10-CM | POA: Diagnosis not present

## 2022-08-27 DIAGNOSIS — G4733 Obstructive sleep apnea (adult) (pediatric): Secondary | ICD-10-CM | POA: Diagnosis not present

## 2022-08-27 DIAGNOSIS — Z9181 History of falling: Secondary | ICD-10-CM | POA: Diagnosis not present

## 2022-08-27 DIAGNOSIS — D492 Neoplasm of unspecified behavior of bone, soft tissue, and skin: Secondary | ICD-10-CM | POA: Diagnosis not present

## 2022-08-27 DIAGNOSIS — E559 Vitamin D deficiency, unspecified: Secondary | ICD-10-CM | POA: Diagnosis not present

## 2022-08-27 DIAGNOSIS — Z7969 Long term (current) use of other immunomodulators and immunosuppressants: Secondary | ICD-10-CM | POA: Diagnosis not present

## 2022-08-27 DIAGNOSIS — I10 Essential (primary) hypertension: Secondary | ICD-10-CM | POA: Diagnosis not present

## 2022-08-27 DIAGNOSIS — I4891 Unspecified atrial fibrillation: Secondary | ICD-10-CM | POA: Diagnosis not present

## 2022-08-27 DIAGNOSIS — M159 Polyosteoarthritis, unspecified: Secondary | ICD-10-CM | POA: Diagnosis not present

## 2022-08-27 DIAGNOSIS — I872 Venous insufficiency (chronic) (peripheral): Secondary | ICD-10-CM | POA: Diagnosis not present

## 2022-08-27 DIAGNOSIS — G894 Chronic pain syndrome: Secondary | ICD-10-CM | POA: Diagnosis not present

## 2022-08-27 DIAGNOSIS — I4510 Unspecified right bundle-branch block: Secondary | ICD-10-CM | POA: Diagnosis not present

## 2022-08-27 DIAGNOSIS — Z7982 Long term (current) use of aspirin: Secondary | ICD-10-CM | POA: Diagnosis not present

## 2022-08-27 DIAGNOSIS — R251 Tremor, unspecified: Secondary | ICD-10-CM | POA: Diagnosis not present

## 2022-08-27 DIAGNOSIS — E782 Mixed hyperlipidemia: Secondary | ICD-10-CM | POA: Diagnosis not present

## 2022-08-27 DIAGNOSIS — Z79891 Long term (current) use of opiate analgesic: Secondary | ICD-10-CM | POA: Diagnosis not present

## 2022-08-27 DIAGNOSIS — M549 Dorsalgia, unspecified: Secondary | ICD-10-CM | POA: Diagnosis not present

## 2022-08-27 DIAGNOSIS — J45909 Unspecified asthma, uncomplicated: Secondary | ICD-10-CM | POA: Diagnosis not present

## 2022-08-27 DIAGNOSIS — R296 Repeated falls: Secondary | ICD-10-CM | POA: Diagnosis not present

## 2022-08-27 DIAGNOSIS — M109 Gout, unspecified: Secondary | ICD-10-CM | POA: Diagnosis not present

## 2022-08-28 ENCOUNTER — Telehealth: Payer: Self-pay | Admitting: *Deleted

## 2022-08-28 NOTE — Telephone Encounter (Signed)
Contacted by Raynelle Jan, PA w./[facility] regarding this mutual patient. CB# [id number]. Ms. Andree Elk had performed recent biopsy of areas of patient's mastectomy scar and received results.  Areas described as small bumps.   Ms. Andree Elk asked for patient to be seen by provider here for follow up. Patient previously received care for breast cancer at [facility] from Dr. Jana Hakim (retired).  Biopsy results faxed to this office. Dr. Chryl Heck informed and reviewed results.  Dr. Chryl Heck requested patient contacted with this information and be scheduled to see her.  Attempted to contacted patient to speak with her  - to let her know that her dermatologist had contacted Dr. Rob Hickman office with test results and that the office wanted to make appt for her to see Dr. Chryl Heck.  LVM with information. Schedule message sent.

## 2022-08-29 ENCOUNTER — Telehealth: Payer: Self-pay | Admitting: Hematology and Oncology

## 2022-08-29 NOTE — Telephone Encounter (Signed)
Contacted patient to scheduled appointments. Left message with appointment details and a call back number if patient had any questions or could not accommodate the time we provided.   

## 2022-08-30 DIAGNOSIS — E782 Mixed hyperlipidemia: Secondary | ICD-10-CM | POA: Diagnosis not present

## 2022-08-30 DIAGNOSIS — I4510 Unspecified right bundle-branch block: Secondary | ICD-10-CM | POA: Diagnosis not present

## 2022-08-30 DIAGNOSIS — R251 Tremor, unspecified: Secondary | ICD-10-CM | POA: Diagnosis not present

## 2022-08-30 DIAGNOSIS — I4891 Unspecified atrial fibrillation: Secondary | ICD-10-CM | POA: Diagnosis not present

## 2022-08-30 DIAGNOSIS — M109 Gout, unspecified: Secondary | ICD-10-CM | POA: Diagnosis not present

## 2022-08-30 DIAGNOSIS — R296 Repeated falls: Secondary | ICD-10-CM | POA: Diagnosis not present

## 2022-08-30 DIAGNOSIS — I872 Venous insufficiency (chronic) (peripheral): Secondary | ICD-10-CM | POA: Diagnosis not present

## 2022-08-30 DIAGNOSIS — Z7969 Long term (current) use of other immunomodulators and immunosuppressants: Secondary | ICD-10-CM | POA: Diagnosis not present

## 2022-08-30 DIAGNOSIS — D492 Neoplasm of unspecified behavior of bone, soft tissue, and skin: Secondary | ICD-10-CM | POA: Diagnosis not present

## 2022-08-30 DIAGNOSIS — I10 Essential (primary) hypertension: Secondary | ICD-10-CM | POA: Diagnosis not present

## 2022-08-30 DIAGNOSIS — Z79891 Long term (current) use of opiate analgesic: Secondary | ICD-10-CM | POA: Diagnosis not present

## 2022-08-30 DIAGNOSIS — Z9181 History of falling: Secondary | ICD-10-CM | POA: Diagnosis not present

## 2022-08-30 DIAGNOSIS — G894 Chronic pain syndrome: Secondary | ICD-10-CM | POA: Diagnosis not present

## 2022-08-30 DIAGNOSIS — Z7982 Long term (current) use of aspirin: Secondary | ICD-10-CM | POA: Diagnosis not present

## 2022-08-30 DIAGNOSIS — J45909 Unspecified asthma, uncomplicated: Secondary | ICD-10-CM | POA: Diagnosis not present

## 2022-08-30 DIAGNOSIS — E559 Vitamin D deficiency, unspecified: Secondary | ICD-10-CM | POA: Diagnosis not present

## 2022-08-30 DIAGNOSIS — M159 Polyosteoarthritis, unspecified: Secondary | ICD-10-CM | POA: Diagnosis not present

## 2022-08-30 DIAGNOSIS — M549 Dorsalgia, unspecified: Secondary | ICD-10-CM | POA: Diagnosis not present

## 2022-08-30 DIAGNOSIS — G4733 Obstructive sleep apnea (adult) (pediatric): Secondary | ICD-10-CM | POA: Diagnosis not present

## 2022-09-03 ENCOUNTER — Encounter: Payer: Self-pay | Admitting: Hematology and Oncology

## 2022-09-03 DIAGNOSIS — D492 Neoplasm of unspecified behavior of bone, soft tissue, and skin: Secondary | ICD-10-CM | POA: Diagnosis not present

## 2022-09-03 DIAGNOSIS — I872 Venous insufficiency (chronic) (peripheral): Secondary | ICD-10-CM | POA: Diagnosis not present

## 2022-09-03 DIAGNOSIS — R296 Repeated falls: Secondary | ICD-10-CM | POA: Diagnosis not present

## 2022-09-03 DIAGNOSIS — Z7969 Long term (current) use of other immunomodulators and immunosuppressants: Secondary | ICD-10-CM | POA: Diagnosis not present

## 2022-09-03 DIAGNOSIS — G4733 Obstructive sleep apnea (adult) (pediatric): Secondary | ICD-10-CM | POA: Diagnosis not present

## 2022-09-03 DIAGNOSIS — I4510 Unspecified right bundle-branch block: Secondary | ICD-10-CM | POA: Diagnosis not present

## 2022-09-03 DIAGNOSIS — R251 Tremor, unspecified: Secondary | ICD-10-CM | POA: Diagnosis not present

## 2022-09-03 DIAGNOSIS — I10 Essential (primary) hypertension: Secondary | ICD-10-CM | POA: Diagnosis not present

## 2022-09-03 DIAGNOSIS — M159 Polyosteoarthritis, unspecified: Secondary | ICD-10-CM | POA: Diagnosis not present

## 2022-09-03 DIAGNOSIS — Z9181 History of falling: Secondary | ICD-10-CM | POA: Diagnosis not present

## 2022-09-03 DIAGNOSIS — Z7982 Long term (current) use of aspirin: Secondary | ICD-10-CM | POA: Diagnosis not present

## 2022-09-03 DIAGNOSIS — G894 Chronic pain syndrome: Secondary | ICD-10-CM | POA: Diagnosis not present

## 2022-09-03 DIAGNOSIS — Z79891 Long term (current) use of opiate analgesic: Secondary | ICD-10-CM | POA: Diagnosis not present

## 2022-09-03 DIAGNOSIS — M549 Dorsalgia, unspecified: Secondary | ICD-10-CM | POA: Diagnosis not present

## 2022-09-03 DIAGNOSIS — E559 Vitamin D deficiency, unspecified: Secondary | ICD-10-CM | POA: Diagnosis not present

## 2022-09-03 DIAGNOSIS — J45909 Unspecified asthma, uncomplicated: Secondary | ICD-10-CM | POA: Diagnosis not present

## 2022-09-03 DIAGNOSIS — I4891 Unspecified atrial fibrillation: Secondary | ICD-10-CM | POA: Diagnosis not present

## 2022-09-03 DIAGNOSIS — M109 Gout, unspecified: Secondary | ICD-10-CM | POA: Diagnosis not present

## 2022-09-03 DIAGNOSIS — E782 Mixed hyperlipidemia: Secondary | ICD-10-CM | POA: Diagnosis not present

## 2022-09-05 ENCOUNTER — Inpatient Hospital Stay: Payer: Medicare Other | Attending: Hematology and Oncology | Admitting: Hematology and Oncology

## 2022-09-05 NOTE — Progress Notes (Deleted)
[place]  [phone] [phone]) [phone]     ID: Debra Griffith DOB: 11-25-1940  MR#: [id number]  NOB#:[id number]  Patient Care Team: Josetta Huddle, MD as PCP - General (Internal Medicine) Magrinat, Virgie Dad, MD (Inactive) as Consulting Physician (Oncology) Rolm Bookbinder, MD as Consulting Physician (General Surgery) Irene Limbo, MD as Consulting Physician (Plastic Surgery) Gaynelle Arabian, MD as Consulting Physician (Orthopedic Surgery) Bo Merino, MD as Consulting Physician (Rheumatology) Delice Bison, Charlestine Massed, NP as Nurse Practitioner (Hematology and Oncology) Bjorn Loser, MD as Consulting Physician (Urology) Neldon Labella, RN as Case Manager OTHER MD:   CHIEF COMPLAINT: Estrogen receptor positive breast cancer (s/p left mastectomy)  CURRENT TREATMENT: Anastrozole   INTERVAL HISTORY: Debra Griffith returns today for follow-up of her estrogen receptor positive breast cancer.  She is accompanied by her husband Debra Griffith  She continues on anastrozole she has tolerated this well, with no unusual side effects and particularly no hot flashes or vaginal dryness problems.  She never had arthralgias or myalgias.  Her most recent bone density screening on 01/26/2020 showed a T-score of -2.2, which is considered osteopenic.  Since her last visit, she underwent right screening mammography with tomography at [facility] on 05/02/2021 showing: breast density category B; no evidence of malignancy.    REVIEW OF SYSTEMS: Debra Griffith is having significant urinary issues which are being managed through Dr. Wendy Poet.  She has had 3 falls, but all of them were related to climbing on something or slipping on some oil on the floor.  She does not have a balance problem.  She does use a Rollator all the time.  She recently burned her fingers while cooking and has lost a little bit of sensation on her finger pads as a result.  Aside from these issues a  detailed review of systems was stable   COVID 19 VACCINATION STATUS: Pfizer x3 as of November 2022   BREAST CANCER HISTORY: From the original intake note:  Taler had minor trauma to the left breast but did not think much about it until while swimming on the medication she felt a hard mass in her left breast. She brought it to medical attention and on 05/04/2016 she underwent left diagnostic mammography with tomography [facility]. This showed the breast density to be category B. In the left breast upper inner quadrant there was a persistent area of asymmetry measuring 4.9 cm. On exam this was a firm but poorly defined palpable mass. Biopsy of this mass 05/04/2016 showed ([id number]) an invasive ductal carcinoma, grade 1, estrogen receptor 90% positive with moderate staining intensity, progesterone receptor 90% positive with strong staining intensity, with an MIB-1 of 10%, and no HER-2 amplification, the signals ratio being 1.19 and the number per cell 1.90.  On 05/09/2016 the patient had ultrasonography of the left axilla which was benign. On the same day she had a second biopsy of what is either a second mass or a distant area of the same mass (in the same quadrant) and this showed ([id number]) invasive ductal carcinoma, grade 2, estrogen receptor 100% positive, progesterone receptor 90% positive, both with strong staining intensity, with an MIB-1 of 15%, and no HER-2 amplification, the signals ratio being 1.50 and the number per cell 3.15.  The patient's subsequent history is as detailed below.   PAST MEDICAL HISTORY: Past Medical History:  Diagnosis Date   Anemia    hx of    Anxiety    Breast cancer in female [facility]) 03/2016  left   Chronic bronchitis ([place])    FLARE UPS USUALLY ONCE A YEAR   Chronic lower back pain    Complication of anesthesia    trouble waking up    CRI (chronic renal insufficiency) 07/27/2016   DDD (degenerative disc disease), lumbar    Depression     GERD (gastroesophageal reflux disease)    History of recent fall    "twice on Sunday; once yesterday" (08/29/2016)   Hypertension    Inflammatory arthritis 07/27/2016   Sero Negative, Positive Synovitis hands, WJ   Insomnia    Irregular heart beats    Macular degeneration of right eye    New onset atrial fibrillation (HCC)    OA (osteoarthritis) of hip 06/24/2012   OA (osteoarthritis) of knee 11/02/2013   L knee    Obesity    Osteoarthritis of both feet 07/27/2016   Osteoarthritis of both hands 07/27/2016   PONV (postoperative nausea and vomiting)    SEVERE N&V AND DIARRHEA AFTER HYSTERECTOMY AND AFTER WISDOM TEETH EXTRACTIONS - NO PROBLEMS WITH LAST 2 SURGERIES - THE HIP AND LEFT KNEE   Postop Acute blood loss anemia 06/25/2012   Postop Hyponatremia 06/25/2012   Postop Sinus tachycardia 06/27/2012   RBBB (right bundle branch block) 07/27/2016   Rheumatoid arthritis ([place])    "doctor recently took me off all RX for this" (08/02/2016)   Right bundle branch block    Right bundle branch block    S/p dental crown    dental crowns on every tooth   Status post total bilateral knee replacement 11/13/2013   Vitamin D deficiency     PAST SURGICAL HISTORY: Past Surgical History:  Procedure Laterality Date   AXILLARY SURGERY Left 06/25/2016   Aspiration of left axillary seroma    BREAST BIOPSY Left 03/2016   BREAST LUMPECTOMY WITH RADIOACTIVE SEED AND SENTINEL LYMPH NODE BIOPSY Left 06/13/2016   Procedure: LEFT BREAST LUMPECTOMY WITH BRACKETED  RADIOACTIVE SEED AND SENTINEL LYMPH NODE BIOPSY;  Surgeon: Rolm Bookbinder, MD;  Location: [place] [facility];  Service: General;  Laterality: Left;   BREAST RECONSTRUCTION Bilateral 06/25/2016   BILATERAL ONCOPLASTIC BREAST RECONSTRUCTION WITH BREAST REDUCTION   BREAST RECONSTRUCTION WITH PLACEMENT OF TISSUE EXPANDER AND FLEX HD (ACELLULAR HYDRATED DERMIS) Bilateral 06/25/2016   Procedure: BILATERAL ONCOPLASTIC BREAST RECONSTRUCTION WITH BREAST REDUCTION,  Aspiration of left axillary seroma;  Surgeon: Irene Limbo, MD;  Location: [facility];  Service: Plastics;  Laterality: Bilateral;   BREAST REDUCTION SURGERY Bilateral 06/25/2016   Procedure: MAMMARY REDUCTION  (BREAST) BILATERAL;  Surgeon: Irene Limbo, MD;  Location: [place];  Service: Plastics;  Laterality: Bilateral;   CESAREAN SECTION  1966; 1971; 1973   COLONOSCOPY     DEBRIDEMENT AND CLOSURE WOUND Right 07/11/2016   Procedure: DEBRIDEMENT LEFT BREAST;  Surgeon: Irene Limbo, MD;  Location: [facility];  Service: Plastics;  Laterality: Right;   EYE SURGERY     INTERSTIM IMPLANT PLACEMENT N/A 08/29/2021   Procedure: Barrie [place] IMPLANT FIRST STAGE;  Surgeon: Bjorn Loser, MD;  Location: [facility];  Service: Urology;  Laterality: N/A;   INTERSTIM IMPLANT PLACEMENT N/A 08/29/2021   Procedure: Barrie Lyme IMPLANT SECOND STAGE AND IMPEDANCE CHECK;  Surgeon: Bjorn Loser, MD;  Location: [facility];  Service: Urology;  Laterality: N/A;   JOINT REPLACEMENT     KNEE ARTHROSCOPY Right    "before replacement"   MASTECTOMY COMPLETE / SIMPLE Left 08/02/2016   total   REDUCTION MAMMAPLASTY Bilateral 06/25/2016   TOTAL ABDOMINAL HYSTERECTOMY W/ BILATERAL SALPINGOOPHORECTOMY  TOTAL HIP ARTHROPLASTY  06/24/2012   Procedure: TOTAL HIP ARTHROPLASTY;  Surgeon: [name], MD;  Location[facility];  Service: Orthopedics;  Laterality: Left;   TOTAL KNEE ARTHROPLASTY N/A 11/02/2013   Procedure: LEFT TOTAL KNEE ARTHROPLASTY WITH RIGHT KNEE CORTISONE INJECTION;  Surgeon: [name], MD;  [facility];  Service: Orthopedics;  Laterality: N/A;   TOTAL KNEE ARTHROPLASTY Right 05/31/2014   Procedure: RIGHT TOTAL KNEE ARTHROPLASTY;  Surgeon: [name], MD;  [facility];  Service: Orthopedics;  Laterality: Right;   TOTAL MASTECTOMY Left 08/02/2016   Procedure: LEFT TOTAL MASTECTOMY;  Surgeon: Rolm Bookbinder, MD;  Location[facility];  Service: General;  Laterality: Left;   [place] EXTRACTION  1970's   admitted to hospital for surgery    FAMILY HISTORY Family History  Problem Relation Age of Onset   Hypertension Mother    Prostate cancer Father    Hypertension Father    Heart Problems Father        quadruple bypass   Hypertension Sister    The patient's mother died at age 12. The patient's father died at age 12 following a stroke. The patient had no brothers, 1 sister. There is no history of breast or ovarian cancer in the family.    GYNECOLOGIC HISTORY:  No LMP recorded. Patient has had a hysterectomy. Menarche age 87, first live birth age 13. The patient is GX P3. She underwent total abdominal hysterectomy, with bilateral salpingo-oophorectomy, in 1993. She used estrogen replacement approximately 9 months. She never used oral contraceptives.   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 Her husband Debra Griffith used to r be in businesses but he also is retired. Son Aaron Edelman is a Programme researcher, broadcasting/film/video in [place]. Son Legrand Como is a Engineer, maintenance (IT) in [employer]. Daughter Tonia Avino lives in [place]. 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 The patient has 5 grandchildren. She is a Nurse, learning disability, currently attending [employer]: In place    HEALTH MAINTENANCE: Social History   Tobacco Use   Smoking status: Never   Smokeless tobacco: Never  Vaping Use   Vaping Use: Never used  Substance Use Topics   Alcohol use: No   Drug use: Yes    Types: Other-see comments    Comment: CBD oil     Colonoscopy: 2015/[facility]  PAP:  Bone density[facility] that showed: T-score of -1.8.    Allergies  Allergen Reactions   Morphine And Related Other (See Comments)    hallucinations    Current Outpatient Medications  Medication Sig Dispense Refill   aspirin EC 81 MG tablet Take 81 mg by mouth daily. Swallow whole.     Cholecalciferol  (VITAMIN D) 125 MCG (5000 UT) CAPS Take 5,000 Units by mouth daily.     clonazePAM (KLONOPIN) 1 MG tablet Take 1 mg by mouth 2 (two) times daily.     Cyanocobalamin (B-12) 2500 MCG TABS Take 2,500 mcg by mouth daily.     cyclobenzaprine (FLEXERIL) 10 MG tablet Take 20 mg by mouth at bedtime.     fluorouracil (EFUDEX) 5 % cream Apply 1 application topically daily as needed (dark spots).     HYDROcodone-acetaminophen (NORCO) 10-325 MG tablet Take 1 tablet by mouth 4 (four) times daily as needed for moderate pain.     metoprolol tartrate (LOPRESSOR)  25 MG tablet Take 25 mg by mouth 2 (two) times daily.      Multiple Vitamins-Minerals (PRESERVISION AREDS 2+MULTI VIT PO) Take 1 capsule by mouth in the morning and at bedtime.     nitrofurantoin (MACRODANTIN) 100 MG capsule Take 1 capsule (100 mg total) by mouth at bedtime.     OVER THE COUNTER MEDICATION Take 1 tablet by mouth daily. Bone up     OVER THE COUNTER MEDICATION Take 2 tablets by mouth daily as needed (constipation). Swiss Kriss     rosuvastatin (CRESTOR) 5 MG tablet Take 5 mg by mouth daily.     sulfamethoxazole-trimethoprim (BACTRIM DS) 800-160 MG tablet Take 1 tablet by mouth 2 (two) times daily. 10 tablet 0   triamterene-hydrochlorothiazide (MAXZIDE-25) 37.5-25 MG tablet Take 1 tablet by mouth daily.     valsartan (DIOVAN) 320 MG tablet Take 320 mg by mouth daily.     venlafaxine XR (EFFEXOR-XR) 75 MG 24 hr capsule Take 1 capsule (75 mg total) by mouth 2 (two) times daily. 180 capsule 4   zolpidem (AMBIEN) 10 MG tablet Take 10 mg by mouth at bedtime.     No current facility-administered medications for this visit.   Facility-Administered Medications Ordered in Other Visits  Medication Dose Route Frequency Provider Last Rate Last Admin   methocarbamol (ROBAXIN) tablet 500 mg  500 mg Oral Q6H PRN Rolm Bookbinder, MD       metroNIDAZOLE (FLAGYL) tablet 500 mg  500 mg Oral Q8H Rolm Bookbinder, MD       ondansetron (ZOFRAN-ODT)  disintegrating tablet 4 mg  4 mg Oral Q6H PRN Rolm Bookbinder, MD       Or   ondansetron [facility]) 4 mg in sodium chloride 0.9 % 50 mL IVPB  4 mg Intravenous Q6H PRN Rolm Bookbinder, MD       oxyCODONE (Oxy IR/ROXICODONE) immediate release tablet 5-10 mg  5-10 mg Oral Q4H PRN Rolm Bookbinder, MD       simethicone [facility]) chewable tablet 40 mg  40 mg Oral Q6H PRN Rolm Bookbinder, MD        OBJECTIVE: White woman using a Rollator  There were no vitals filed for this visit.   Wt Readings from Last 3 Encounters:  08/29/21 278 lb (126.1 kg)  08/23/21 278 lb (126.1 kg)  08/14/21 278 lb (126.1 kg)   There is no height or weight on file to calculate BMI.    ECOG FS:1 - Symptomatic but completely ambulatory  Sclerae unicteric, EOMs intact Wearing a mask No cervical or supraclavicular adenopathy Lungs no rales or rhonchi Heart regular rate and rhythm Abd soft, nontender, positive bowel sounds MSK no focal spinal tenderness, no upper extremity lymphedema Neuro: nonfocal, well oriented, appropriate affect Breasts: The right breast is unremarkable.  The left breast is status postmastectomy.  There is no evidence of local recurrence.  Both axillae are benign.   LAB RESULTS:  CMP     Component Value Date/Time   NA 141 08/14/2021 1120   NA 140 07/04/2017 1112   K 3.7 08/14/2021 1120   K 4.0 07/04/2017 1112   CL 104 08/14/2021 1120   CO2 27 08/14/2021 1120   CO2 28 07/04/2017 1112   GLUCOSE 114 (H) 08/14/2021 1120   GLUCOSE 106 07/04/2017 1112   BUN 21 08/14/2021 1120   BUN 26.7 (H) 07/04/2017 1112   CREATININE 0.99 08/14/2021 1120   CREATININE 1.1 07/04/2017 1112   CALCIUM 9.5 08/14/2021 1120   CALCIUM 9.8 07/04/2017  1112   PROT 6.6 08/14/2021 1120   PROT 6.9 07/04/2017 1112   ALBUMIN 3.8 08/14/2021 1120   ALBUMIN 3.8 07/04/2017 1112   AST 20 08/14/2021 1120   AST 21 07/04/2017 1112   ALT 24 08/14/2021 1120   ALT 29 07/04/2017 1112   ALKPHOS 77 08/14/2021 1120    ALKPHOS 87 07/04/2017 1112   BILITOT 0.5 08/14/2021 1120   BILITOT 0.37 07/04/2017 1112   GFRNONAA 58 (L) 08/14/2021 1120   GFRAA >60 06/08/2020 0857   GFRAA 53 (L) 02/05/2018 1012    INo results found for: "SPEP", "UPEP"  Lab Results  Component Value Date   WBC 7.0 08/14/2021   NEUTROABS 4.0 08/14/2021   HGB 13.0 08/14/2021   HCT 38.9 08/14/2021   MCV 89.4 08/14/2021   PLT 208 08/14/2021      Chemistry      Component Value Date/Time   NA 141 08/14/2021 1120   NA 140 07/04/2017 1112   K 3.7 08/14/2021 1120   K 4.0 07/04/2017 1112   CL 104 08/14/2021 1120   CO2 27 08/14/2021 1120   CO2 28 07/04/2017 1112   BUN 21 08/14/2021 1120   BUN 26.7 (H) 07/04/2017 1112   CREATININE 0.99 08/14/2021 1120   CREATININE 1.1 07/04/2017 1112   GLU 95 01/20/2016 0000      Component Value Date/Time   CALCIUM 9.5 08/14/2021 1120   CALCIUM 9.8 07/04/2017 1112   ALKPHOS 77 08/14/2021 1120   ALKPHOS 87 07/04/2017 1112   AST 20 08/14/2021 1120   AST 21 07/04/2017 1112   ALT 24 08/14/2021 1120   ALT 29 07/04/2017 1112   BILITOT 0.5 08/14/2021 1120   BILITOT 0.37 07/04/2017 1112       No results found for: "LABCA2"  No components found for: "LABCA125"  No results for input(s): "INR" in the last 168 hours.  Urinalysis    Component Value Date/Time   COLORURINE YELLOW 08/29/2016 2120   APPEARANCEUR HAZY (A) 08/29/2016 2120   LABSPEC 1.014 08/29/2016 2120   PHURINE 5.0 08/29/2016 2120   GLUCOSEU NEGATIVE 08/29/2016 2120   HGBUR NEGATIVE 08/29/2016 2120   BILIRUBINUR n 01/25/2020 1101   KETONESUR NEGATIVE 08/29/2016 2120   PROTEINUR Negative 01/25/2020 1101   PROTEINUR NEGATIVE 08/29/2016 2120   UROBILINOGEN negative (A) 01/25/2020 1101   UROBILINOGEN 0.2 05/26/2014 1054   NITRITE n 01/25/2020 1101   NITRITE NEGATIVE 08/29/2016 2120   LEUKOCYTESUR Small (1+) (A) 01/25/2020 1101    STUDIES: No results found.   ELIGIBLE FOR AVAILABLE RESEARCH PROTOCOL:  no  ASSESSMENT: 81 y.o. [place] woman status post left breast upper inner quadrant biopsy 05/04/2016 for a clinical T2 N0 invasive ductal carcinoma, grade 1 estrogen and progesterone receptor positive, HER-2 nonamplified, with an MIB-1 of 10%.  (1) biopsy of a second area 05/09/2016 also in the upper inner quadrant of the left breast showed invasive ductal carcinoma, grade 2, estrogen and progesterone receptor positive, HER-2 nonamplified, with an MIB-1 of 15%.  (2) status post left lumpectomy and sentinel lymph node sampling 06/13/2016 for an mpT1c pN0(i+), stage IA invasive ductal carcinoma, grade 1, with close but negative margins  (a) status post bilateral reduction mammoplasty with left oncoplastic surgery 06/25/2016  (b) status post debridement of left nipple/areolar necrosis 07/11/2016  (c) status post left simple mastectomy 08/02/2016 showing inflammation and abscess but no evidence of malignancy  (3) Oncotype score of 18 predicts a 10 year risk of recurrence outside the breast of 11% if  the patient's only systemic therapy is tamoxifen for 5 years. It also predicts no significant benefit from chemotherapy  (4) postmastectomy radiation not indicated  (5) started anastrozole September 2017, completing five years November 2022  (a) DEXA scan 04/17/2017 showed a T score of -1.8   (B DEXA scan 01/26/2020) showed a T score of -2.2.   PLAN:  She has completed 5 years of anastrozole.   Last mammogram with no concern for malignancy.  *Total Encounter Time as defined by the Centers for Medicare and Medicaid Services includes, in addition to the face-to-face time of a patient visit (documented in the note above) non-face-to-face time: obtaining and reviewing outside history, ordering and reviewing medications, tests or procedures, care coordination (communications with other health care professionals or caregivers) and documentation in the medical record.

## 2022-09-06 ENCOUNTER — Encounter (INDEPENDENT_AMBULATORY_CARE_PROVIDER_SITE_OTHER): Payer: Medicare Other | Admitting: Ophthalmology

## 2022-09-06 DIAGNOSIS — H35722 Serous detachment of retinal pigment epithelium, left eye: Secondary | ICD-10-CM | POA: Diagnosis not present

## 2022-09-06 DIAGNOSIS — H353212 Exudative age-related macular degeneration, right eye, with inactive choroidal neovascularization: Secondary | ICD-10-CM | POA: Diagnosis not present

## 2022-09-06 DIAGNOSIS — H3554 Dystrophies primarily involving the retinal pigment epithelium: Secondary | ICD-10-CM | POA: Diagnosis not present

## 2022-09-06 DIAGNOSIS — H353134 Nonexudative age-related macular degeneration, bilateral, advanced atrophic with subfoveal involvement: Secondary | ICD-10-CM | POA: Diagnosis not present

## 2022-09-13 DIAGNOSIS — Z79891 Long term (current) use of opiate analgesic: Secondary | ICD-10-CM | POA: Diagnosis not present

## 2022-09-13 DIAGNOSIS — M159 Polyosteoarthritis, unspecified: Secondary | ICD-10-CM | POA: Diagnosis not present

## 2022-09-13 DIAGNOSIS — I10 Essential (primary) hypertension: Secondary | ICD-10-CM | POA: Diagnosis not present

## 2022-09-13 DIAGNOSIS — I4510 Unspecified right bundle-branch block: Secondary | ICD-10-CM | POA: Diagnosis not present

## 2022-09-13 DIAGNOSIS — Z7982 Long term (current) use of aspirin: Secondary | ICD-10-CM | POA: Diagnosis not present

## 2022-09-13 DIAGNOSIS — I872 Venous insufficiency (chronic) (peripheral): Secondary | ICD-10-CM | POA: Diagnosis not present

## 2022-09-13 DIAGNOSIS — G894 Chronic pain syndrome: Secondary | ICD-10-CM | POA: Diagnosis not present

## 2022-09-13 DIAGNOSIS — Z7969 Long term (current) use of other immunomodulators and immunosuppressants: Secondary | ICD-10-CM | POA: Diagnosis not present

## 2022-09-13 DIAGNOSIS — D492 Neoplasm of unspecified behavior of bone, soft tissue, and skin: Secondary | ICD-10-CM | POA: Diagnosis not present

## 2022-09-13 DIAGNOSIS — Z9181 History of falling: Secondary | ICD-10-CM | POA: Diagnosis not present

## 2022-09-13 DIAGNOSIS — M109 Gout, unspecified: Secondary | ICD-10-CM | POA: Diagnosis not present

## 2022-09-13 DIAGNOSIS — E559 Vitamin D deficiency, unspecified: Secondary | ICD-10-CM | POA: Diagnosis not present

## 2022-09-13 DIAGNOSIS — I4891 Unspecified atrial fibrillation: Secondary | ICD-10-CM | POA: Diagnosis not present

## 2022-09-13 DIAGNOSIS — J45909 Unspecified asthma, uncomplicated: Secondary | ICD-10-CM | POA: Diagnosis not present

## 2022-09-13 DIAGNOSIS — E782 Mixed hyperlipidemia: Secondary | ICD-10-CM | POA: Diagnosis not present

## 2022-09-13 DIAGNOSIS — M549 Dorsalgia, unspecified: Secondary | ICD-10-CM | POA: Diagnosis not present

## 2022-09-13 DIAGNOSIS — R296 Repeated falls: Secondary | ICD-10-CM | POA: Diagnosis not present

## 2022-09-13 DIAGNOSIS — G4733 Obstructive sleep apnea (adult) (pediatric): Secondary | ICD-10-CM | POA: Diagnosis not present

## 2022-09-13 DIAGNOSIS — R251 Tremor, unspecified: Secondary | ICD-10-CM | POA: Diagnosis not present

## 2022-09-20 ENCOUNTER — Telehealth: Payer: Self-pay | Admitting: *Deleted

## 2022-09-20 NOTE — Telephone Encounter (Signed)
Several attempts had been made to call pt with no response. Called pt son Debra Griffith to make aware of pt being seen by Oncologist here at [facility] by pt son to move forward with making appt for 09/21/2022 at 63 and he will reach out to pt to advise of appt. Advised son to have pt to call if there are changes that need to be made for appt. Pt son verbalized understanding.

## 2022-09-21 ENCOUNTER — Inpatient Hospital Stay: Payer: Medicare Other | Attending: Hematology and Oncology | Admitting: Hematology and Oncology

## 2022-09-21 ENCOUNTER — Telehealth: Payer: Self-pay | Admitting: *Deleted

## 2022-09-21 ENCOUNTER — Inpatient Hospital Stay: Payer: Medicare Other

## 2022-09-21 ENCOUNTER — Encounter: Payer: Self-pay | Admitting: Hematology and Oncology

## 2022-09-21 ENCOUNTER — Other Ambulatory Visit: Payer: Self-pay

## 2022-09-21 VITALS — BP 142/61 | HR 80 | Temp 97.9°F | Resp 16 | Ht 62.0 in

## 2022-09-21 DIAGNOSIS — I4891 Unspecified atrial fibrillation: Secondary | ICD-10-CM | POA: Insufficient documentation

## 2022-09-21 DIAGNOSIS — C50212 Malignant neoplasm of upper-inner quadrant of left female breast: Secondary | ICD-10-CM | POA: Diagnosis not present

## 2022-09-21 DIAGNOSIS — Z7982 Long term (current) use of aspirin: Secondary | ICD-10-CM | POA: Diagnosis not present

## 2022-09-21 DIAGNOSIS — Z90710 Acquired absence of both cervix and uterus: Secondary | ICD-10-CM | POA: Diagnosis not present

## 2022-09-21 DIAGNOSIS — Z79899 Other long term (current) drug therapy: Secondary | ICD-10-CM | POA: Diagnosis not present

## 2022-09-21 DIAGNOSIS — Z90722 Acquired absence of ovaries, bilateral: Secondary | ICD-10-CM | POA: Diagnosis not present

## 2022-09-21 DIAGNOSIS — G4700 Insomnia, unspecified: Secondary | ICD-10-CM | POA: Insufficient documentation

## 2022-09-21 DIAGNOSIS — Z170 Estrogen receptor positive status [ER+]: Secondary | ICD-10-CM | POA: Insufficient documentation

## 2022-09-21 DIAGNOSIS — C7989 Secondary malignant neoplasm of other specified sites: Secondary | ICD-10-CM | POA: Diagnosis not present

## 2022-09-21 DIAGNOSIS — M069 Rheumatoid arthritis, unspecified: Secondary | ICD-10-CM | POA: Diagnosis not present

## 2022-09-21 DIAGNOSIS — R32 Unspecified urinary incontinence: Secondary | ICD-10-CM | POA: Insufficient documentation

## 2022-09-21 DIAGNOSIS — Z79818 Long term (current) use of other agents affecting estrogen receptors and estrogen levels: Secondary | ICD-10-CM | POA: Diagnosis not present

## 2022-09-21 DIAGNOSIS — C44212 Basal cell carcinoma of skin of right ear and external auricular canal: Secondary | ICD-10-CM | POA: Diagnosis not present

## 2022-09-21 DIAGNOSIS — G8929 Other chronic pain: Secondary | ICD-10-CM | POA: Diagnosis not present

## 2022-09-21 DIAGNOSIS — C792 Secondary malignant neoplasm of skin: Secondary | ICD-10-CM | POA: Diagnosis not present

## 2022-09-21 DIAGNOSIS — K219 Gastro-esophageal reflux disease without esophagitis: Secondary | ICD-10-CM | POA: Insufficient documentation

## 2022-09-21 DIAGNOSIS — I129 Hypertensive chronic kidney disease with stage 1 through stage 4 chronic kidney disease, or unspecified chronic kidney disease: Secondary | ICD-10-CM | POA: Insufficient documentation

## 2022-09-21 LAB — CBC WITH DIFFERENTIAL/PLATELET
Abs Immature Granulocytes: 0.03 10*3/uL (ref 0.00–0.07)
Basophils Absolute: 0 10*3/uL (ref 0.0–0.1)
Basophils Relative: 1 %
Eosinophils Absolute: 0.3 10*3/uL (ref 0.0–0.5)
Eosinophils Relative: 5 %
HCT: 41.6 % (ref 36.0–46.0)
Hemoglobin: 13.2 g/dL (ref 12.0–15.0)
Immature Granulocytes: 1 %
Lymphocytes Relative: 26 %
Lymphs Abs: 1.7 10*3/uL (ref 0.7–4.0)
MCH: 28.9 pg (ref 26.0–34.0)
MCHC: 31.7 g/dL (ref 30.0–36.0)
MCV: 91.2 fL (ref 80.0–100.0)
Monocytes Absolute: 0.6 10*3/uL (ref 0.1–1.0)
Monocytes Relative: 9 %
Neutro Abs: 3.8 10*3/uL (ref 1.7–7.7)
Neutrophils Relative %: 58 %
Platelets: 214 10*3/uL (ref 150–400)
RBC: 4.56 MIL/uL (ref 3.87–5.11)
RDW: 14 % (ref 11.5–15.5)
WBC: 6.4 10*3/uL (ref 4.0–10.5)
nRBC: 0 % (ref 0.0–0.2)

## 2022-09-21 LAB — COMPREHENSIVE METABOLIC PANEL
ALT: 20 U/L (ref 0–44)
AST: 20 U/L (ref 15–41)
Albumin: 3.9 g/dL (ref 3.5–5.0)
Alkaline Phosphatase: 99 U/L (ref 38–126)
Anion gap: 6 (ref 5–15)
BUN: 20 mg/dL (ref 8–23)
CO2: 31 mmol/L (ref 22–32)
Calcium: 9.6 mg/dL (ref 8.9–10.3)
Chloride: 104 mmol/L (ref 98–111)
Creatinine, Ser: 1.02 mg/dL — ABNORMAL HIGH (ref 0.44–1.00)
GFR, Estimated: 56 mL/min — ABNORMAL LOW (ref >60)
Glucose, Bld: 139 mg/dL — ABNORMAL HIGH (ref 70–99)
Potassium: 3.8 mmol/L (ref 3.5–5.1)
Sodium: 141 mmol/L (ref 135–145)
Total Bilirubin: 0.3 mg/dL (ref 0.3–1.2)
Total Protein: 6.1 g/dL — ABNORMAL LOW (ref 6.5–8.1)

## 2022-09-21 NOTE — Telephone Encounter (Signed)
error 

## 2022-09-21 NOTE — Progress Notes (Signed)
[place]  [phone] [phone]     ID: Debra Griffith DOB: 1941-04-06  MR#: [id number]  ELT#:[id number]  Patient Care Team: Josetta Huddle, MD as PCP - General (Internal Medicine) Magrinat, Virgie Dad, MD (Inactive) as Consulting Physician (Oncology) Rolm Bookbinder, MD as Consulting Physician (General Surgery) Irene Limbo, MD as Consulting Physician (Plastic Surgery) Gaynelle Arabian, MD as Consulting Physician (Orthopedic Surgery) Bo Merino, MD as Consulting Physician (Rheumatology) Delice Bison, Charlestine Massed, NP as Nurse Practitioner (Hematology and Oncology) Bjorn Loser, MD as Consulting Physician (Urology) Neldon Labella, RN as Case Manager OTHER MD:   CHIEF COMPLAINT: Estrogen receptor positive breast cancer (s/p left mastectomy)  CURRENT TREATMENT: None  INTERVAL HISTORY: Debra Griffith returns today for follow-up of her estrogen receptor positive breast cancer.  She was last seen in November 2022 by Dr. Jana Hakim and was discharged since she completed 5 years of antiestrogen therapy.  Most recently she went to her dermatologist, had a shave biopsy of the skin lesion on the left chest wall and this resulted as metastatic breast cancer. We tried to see her before Christmas as soon as we got the results however we were unable to reach her.  We called her son yesterday and got rescheduled for this morning.  She is here with her daughter and her husband.  She tells me that she had this skin changes for the past year or so.  She denies any other complaints.  At baseline she walks with a walker, has some back issues and cannot walk long distances hence she is today in a wheelchair.  She denies any changes in her breathing, bowel habits, urinary habits or new neurological complaints.  She has chronic urinary incontinence.  Rest of the pertinent 10 point ROS reviewed and negative    COVID 19 VACCINATION STATUS: Pfizer x3 as of November  2022   BREAST CANCER HISTORY: From the original intake note:  Tenea had minor trauma to the left breast but did not think much about it until while swimming on the medication she felt a hard mass in her left breast. She brought it to medical attention and on 05/04/2016 she underwent left diagnostic mammography with tomography at the [facility] Center. This showed the breast density to be category B. In the left breast upper inner quadrant there was a persistent area of asymmetry measuring 4.9 cm. On exam this was a firm but poorly defined palpable mass. Biopsy of this mass 05/04/2016 showed ([id number]) an invasive ductal carcinoma, grade 1, estrogen receptor 90% positive with moderate staining intensity, progesterone receptor 90% positive with strong staining intensity, with an MIB-1 of 10%, and no HER-2 amplification, the signals ratio being 1.19 and the number per cell 1.90.  On 05/09/2016 the patient had ultrasonography of the left axilla which was benign. On the same day she had a second biopsy of what is either a second mass or a distant area of the same mass (in the same quadrant) and this showed ([id number]) invasive ductal carcinoma, grade 2, estrogen receptor 100% positive, progesterone receptor 90% positive, both with strong staining intensity, with an MIB-1 of 15%, and no HER-2 amplification, the signals ratio being 1.50 and the number per cell 3.15.  The patient's subsequent history is as detailed below.   PAST MEDICAL HISTORY: Past Medical History:  Diagnosis Date   Anemia    hx of    Anxiety    Breast cancer in female [facility]) 03/2016   left   Chronic bronchitis (  [place])    FLARE UPS USUALLY ONCE A YEAR   Chronic lower back pain    Complication of anesthesia    trouble waking up    CRI (chronic renal insufficiency) 07/27/2016   DDD (degenerative disc disease), lumbar    Depression    GERD (gastroesophageal reflux disease)    History of recent fall    "twice on Sunday; once  yesterday" (08/29/2016)   Hypertension    Inflammatory arthritis 07/27/2016   Sero Negative, Positive Synovitis hands, WJ   Insomnia    Irregular heart beats    Macular degeneration of right eye    New onset atrial fibrillation (HCC)    OA (osteoarthritis) of hip 06/24/2012   OA (osteoarthritis) of knee 11/02/2013   L knee    Obesity    Osteoarthritis of both feet 07/27/2016   Osteoarthritis of both hands 07/27/2016   PONV (postoperative nausea and vomiting)    SEVERE N&V AND DIARRHEA AFTER HYSTERECTOMY AND AFTER WISDOM TEETH EXTRACTIONS - NO PROBLEMS WITH LAST 2 SURGERIES - THE HIP AND LEFT KNEE   Postop Acute blood loss anemia 06/25/2012   Postop Hyponatremia 06/25/2012   Postop Sinus tachycardia 06/27/2012   RBBB (right bundle branch block) 07/27/2016   Rheumatoid arthritis ([place])    "doctor recently took me off all RX for this" (08/02/2016)   Right bundle branch block    Right bundle branch block    S/p dental crown    dental crowns on every tooth   Status post total bilateral knee replacement 11/13/2013   Vitamin D deficiency     PAST SURGICAL HISTORY: Past Surgical History:  Procedure Laterality Date   AXILLARY SURGERY Left 06/25/2016   Aspiration of left axillary seroma    BREAST BIOPSY Left 03/2016   BREAST LUMPECTOMY WITH RADIOACTIVE SEED AND SENTINEL LYMPH NODE BIOPSY Left 06/13/2016   Procedure: LEFT BREAST LUMPECTOMY WITH BRACKETED  RADIOACTIVE SEED AND SENTINEL LYMPH NODE BIOPSY;  Surgeon: Rolm Bookbinder, MD;  Location: [place];  Service: General;  Laterality: Left;   BREAST RECONSTRUCTION Bilateral 06/25/2016   BILATERAL ONCOPLASTIC BREAST RECONSTRUCTION WITH BREAST REDUCTION   BREAST RECONSTRUCTION WITH PLACEMENT OF TISSUE EXPANDER AND FLEX HD (ACELLULAR HYDRATED DERMIS) Bilateral 06/25/2016   Procedure: BILATERAL ONCOPLASTIC BREAST RECONSTRUCTION WITH BREAST REDUCTION, Aspiration of left axillary seroma;  Surgeon: Irene Limbo, MD;  Location: [place];  Service:  Plastics;  Laterality: Bilateral;   BREAST REDUCTION SURGERY Bilateral 06/25/2016   Procedure: MAMMARY REDUCTION  (BREAST) BILATERAL;  Surgeon: Irene Limbo, MD;  Location: [place];  Service: Plastics;  Laterality: Bilateral;   CESAREAN SECTION  1966; 1971; 1973   COLONOSCOPY     DEBRIDEMENT AND CLOSURE WOUND Right 07/11/2016   Procedure: DEBRIDEMENT LEFT BREAST;  Surgeon: Irene Limbo, MD;  Location: [place];  Service: Plastics;  Laterality: Right;   EYE SURGERY     INTERSTIM IMPLANT PLACEMENT N/A 08/29/2021   Procedure: [place] IMPLANT FIRST STAGE;  Surgeon: Bjorn Loser, MD;  Location: [facility];  Service: Urology;  Laterality: N/A;   INTERSTIM IMPLANT PLACEMENT N/A 08/29/2021   Procedure: Barrie Lyme IMPLANT SECOND STAGE AND IMPEDANCE CHECK;  Surgeon: Bjorn Loser, MD;  Location: [facility];  Service: Urology;  Laterality: N/A;   JOINT REPLACEMENT     KNEE ARTHROSCOPY Right    "before replacement"   MASTECTOMY COMPLETE / SIMPLE Left 08/02/2016   total   REDUCTION MAMMAPLASTY Bilateral 06/25/2016   TOTAL ABDOMINAL HYSTERECTOMY W/ BILATERAL SALPINGOOPHORECTOMY     TOTAL HIP ARTHROPLASTY  06/24/2012   Procedure: TOTAL HIP ARTHROPLASTY;  Surgeon: [name], MD;  Location[facility];  Service: Orthopedics;  Laterality: Left;   TOTAL KNEE ARTHROPLASTY N/A 11/02/2013   Procedure: LEFT TOTAL KNEE ARTHROPLASTY WITH RIGHT KNEE CORTISONE INJECTION;  Surgeon: [name], MD;  [facility];  Service: Orthopedics;  Laterality: N/A;   TOTAL KNEE ARTHROPLASTY Right 05/31/2014   Procedure: RIGHT TOTAL KNEE ARTHROPLASTY;  Surgeon: [name], MD;  [facility];  Service: Orthopedics;  Laterality: Right;   TOTAL MASTECTOMY Left 08/02/2016   Procedure: LEFT TOTAL MASTECTOMY;  Surgeon: Rolm Bookbinder, MD;  [facility];  Service: General;  Laterality: Left;   [place] EXTRACTION  1970's   admitted to hospital for surgery    FAMILY  HISTORY Family History  Problem Relation Age of Onset   Hypertension Mother    Prostate cancer Father    Hypertension Father    Heart Problems Father        quadruple bypass   Hypertension Sister    The patient's mother died at age 71. The patient's father died at age 13 following a stroke. The patient had no brothers, 1 sister. There is no history of breast or ovarian cancer in the family.    GYNECOLOGIC HISTORY:  No LMP recorded. Patient has had a hysterectomy. Menarche age 89, first live birth age 12. The patient is GX P3. She underwent total abdominal hysterectomy, with bilateral salpingo-oophorectomy, in 1993. She used estrogen replacement approximately 9 months. She never used oral contraceptives.   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 Her husband [name] used to r be in businesses but he also is retired. Son Aaron Edelman is a Programme researcher, broadcasting/film/video in [place]. Son Legrand Como is a Engineer, maintenance (IT) in [employer]. Daughter Tishara Pizano lives in [place]. 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 The patient has 5 grandchildren. She is a Nurse, learning disability, currently attending [employer]: In place    HEALTH MAINTENANCE: Social History   Tobacco Use   Smoking status: Never   Smokeless tobacco: Never  Vaping Use   Vaping Use: Never used  Substance Use Topics   Alcohol use: No   Drug use: Yes    Types: Other-see comments    Comment: CBD oil     Colonoscopy: 2015/[facility]  PAP:  Bone density: [facility]2017 that showed: T-score of -1.8.    Allergies  Allergen Reactions   Morphine And Related Other (See Comments)    hallucinations    Current Outpatient Medications  Medication Sig Dispense Refill   aspirin EC 81 MG tablet Take 81 mg by mouth daily. Swallow whole.     Cholecalciferol (VITAMIN D) 125 MCG (5000 UT) CAPS Take 5,000 Units by mouth daily.     clonazePAM (KLONOPIN) 1  MG tablet Take 1 mg by mouth 2 (two) times daily.     Cyanocobalamin (B-12) 2500 MCG TABS Take 2,500 mcg by mouth daily.     cyclobenzaprine (FLEXERIL) 10 MG tablet Take 20 mg by mouth at bedtime.     fluorouracil (EFUDEX) 5 % cream Apply 1 application topically daily as needed (dark spots).     HYDROcodone-acetaminophen (NORCO) 10-325 MG tablet Take 1 tablet by mouth 4 (four) times daily as needed for moderate pain.     metoprolol tartrate (LOPRESSOR) 25 MG tablet Take  25 mg by mouth 2 (two) times daily.      Multiple Vitamins-Minerals (PRESERVISION AREDS 2+MULTI VIT PO) Take 1 capsule by mouth in the morning and at bedtime.     nitrofurantoin (MACRODANTIN) 100 MG capsule Take 1 capsule (100 mg total) by mouth at bedtime.     OVER THE COUNTER MEDICATION Take 1 tablet by mouth daily. Bone up     OVER THE COUNTER MEDICATION Take 2 tablets by mouth daily as needed (constipation). Swiss Kriss     rosuvastatin (CRESTOR) 5 MG tablet Take 5 mg by mouth daily.     sulfamethoxazole-trimethoprim (BACTRIM DS) 800-160 MG tablet Take 1 tablet by mouth 2 (two) times daily. 10 tablet 0   triamterene-hydrochlorothiazide (MAXZIDE-25) 37.5-25 MG tablet Take 1 tablet by mouth daily.     valsartan (DIOVAN) 320 MG tablet Take 320 mg by mouth daily.     venlafaxine XR (EFFEXOR-XR) 75 MG 24 hr capsule Take 1 capsule (75 mg total) by mouth 2 (two) times daily. 180 capsule 4   zolpidem (AMBIEN) 10 MG tablet Take 10 mg by mouth at bedtime.     No current facility-administered medications for this visit.   Facility-Administered Medications Ordered in Other Visits  Medication Dose Route Frequency Provider Last Rate Last Admin   methocarbamol (ROBAXIN) tablet 500 mg  500 mg Oral Q6H PRN Rolm Bookbinder, MD       metroNIDAZOLE (FLAGYL) tablet 500 mg  500 mg Oral Q8H Rolm Bookbinder, MD       ondansetron (ZOFRAN-ODT) disintegrating tablet 4 mg  4 mg Oral Q6H PRN Rolm Bookbinder, MD       Or   ondansetron  [facility]) 4 mg in sodium chloride 0.9 % 50 mL IVPB  4 mg Intravenous Q6H PRN Rolm Bookbinder, MD       oxyCODONE (Oxy IR/ROXICODONE) immediate release tablet 5-10 mg  5-10 mg Oral Q4H PRN Rolm Bookbinder, MD       simethicone [facility]) chewable tablet 40 mg  40 mg Oral Q6H PRN Rolm Bookbinder, MD        OBJECTIVE: White woman using a Rollator  There were no vitals filed for this visit.   Wt Readings from Last 3 Encounters:  08/29/21 278 lb (126.1 kg)  08/23/21 278 lb (126.1 kg)  08/14/21 278 lb (126.1 kg)   There is no height or weight on file to calculate BMI.    ECOG FS:1 - Symptomatic but completely ambulatory  Sclerae unicteric, EOMs intact No palpable cervical adenopathy. Left chest wall skin lesions noted, pictures in media.  No definitive palpable regional adenopathy Lungs clear to auscultation bilaterally No lower extremity edema  LAB RESULTS:  CMP     Component Value Date/Time   NA 141 08/14/2021 1120   NA 140 07/04/2017 1112   K 3.7 08/14/2021 1120   K 4.0 07/04/2017 1112   CL 104 08/14/2021 1120   CO2 27 08/14/2021 1120   CO2 28 07/04/2017 1112   GLUCOSE 114 (H) 08/14/2021 1120   GLUCOSE 106 07/04/2017 1112   BUN 21 08/14/2021 1120   BUN 26.7 (H) 07/04/2017 1112   CREATININE 0.99 08/14/2021 1120   CREATININE 1.1 07/04/2017 1112   CALCIUM 9.5 08/14/2021 1120   CALCIUM 9.8 07/04/2017 1112   PROT 6.6 08/14/2021 1120   PROT 6.9 07/04/2017 1112   ALBUMIN 3.8 08/14/2021 1120   ALBUMIN 3.8 07/04/2017 1112   AST 20 08/14/2021 1120   AST 21 07/04/2017 1112   ALT 24 08/14/2021  1120   ALT 29 07/04/2017 1112   ALKPHOS 77 08/14/2021 1120   ALKPHOS 87 07/04/2017 1112   BILITOT 0.5 08/14/2021 1120   BILITOT 0.37 07/04/2017 1112   GFRNONAA 58 (L) 08/14/2021 1120   GFRAA >60 06/08/2020 0857   GFRAA 53 (L) 02/05/2018 1012    INo results found for: "SPEP", "UPEP"  Lab Results  Component Value Date   WBC 7.0 08/14/2021   NEUTROABS 4.0 08/14/2021    HGB 13.0 08/14/2021   HCT 38.9 08/14/2021   MCV 89.4 08/14/2021   PLT 208 08/14/2021      Chemistry      Component Value Date/Time   NA 141 08/14/2021 1120   NA 140 07/04/2017 1112   K 3.7 08/14/2021 1120   K 4.0 07/04/2017 1112   CL 104 08/14/2021 1120   CO2 27 08/14/2021 1120   CO2 28 07/04/2017 1112   BUN 21 08/14/2021 1120   BUN 26.7 (H) 07/04/2017 1112   CREATININE 0.99 08/14/2021 1120   CREATININE 1.1 07/04/2017 1112   GLU 95 01/20/2016 0000      Component Value Date/Time   CALCIUM 9.5 08/14/2021 1120   CALCIUM 9.8 07/04/2017 1112   ALKPHOS 77 08/14/2021 1120   ALKPHOS 87 07/04/2017 1112   AST 20 08/14/2021 1120   AST 21 07/04/2017 1112   ALT 24 08/14/2021 1120   ALT 29 07/04/2017 1112   BILITOT 0.5 08/14/2021 1120   BILITOT 0.37 07/04/2017 1112       No results found for: "LABCA2"  No components found for: "LABCA125"  No results for input(s): "INR" in the last 168 hours.  Urinalysis    Component Value Date/Time   COLORURINE YELLOW 08/29/2016 2120   APPEARANCEUR HAZY (A) 08/29/2016 2120   LABSPEC 1.014 08/29/2016 2120   PHURINE 5.0 08/29/2016 2120   GLUCOSEU NEGATIVE 08/29/2016 2120   HGBUR NEGATIVE 08/29/2016 2120   BILIRUBINUR n 01/25/2020 1101   KETONESUR NEGATIVE 08/29/2016 2120   PROTEINUR Negative 01/25/2020 1101   PROTEINUR NEGATIVE 08/29/2016 2120   UROBILINOGEN negative (A) 01/25/2020 1101   UROBILINOGEN 0.2 05/26/2014 1054   NITRITE n 01/25/2020 1101   NITRITE NEGATIVE 08/29/2016 2120   LEUKOCYTESUR Small (1+) (A) 01/25/2020 1101    STUDIES: No results found.   ELIGIBLE FOR AVAILABLE RESEARCH PROTOCOL: no  ASSESSMENT: 82 y.o.  woman status post left breast upper inner quadrant biopsy 05/04/2016 for a clinical T2 N0 invasive ductal carcinoma, grade 1 estrogen and progesterone receptor positive, HER-2 nonamplified, with an MIB-1 of 10%.  (1) biopsy of a second area 05/09/2016 also in the upper inner quadrant of the left  breast showed invasive ductal carcinoma, grade 2, estrogen and progesterone receptor positive, HER-2 nonamplified, with an MIB-1 of 15%.  (2) status post left lumpectomy and sentinel lymph node sampling 06/13/2016 for an mpT1c pN0(i+), stage IA invasive ductal carcinoma, grade 1, with close but negative margins  (a) status post bilateral reduction mammoplasty with left oncoplastic surgery 06/25/2016  (b) status post debridement of left nipple/areolar necrosis 07/11/2016  (c) status post left simple mastectomy 08/02/2016 showing inflammation and abscess but no evidence of malignancy  (3) Oncotype score of 18 predicts a 10 year risk of recurrence outside the breast of 11% if the patient's only systemic therapy is tamoxifen for 5 years. It also predicts no significant benefit from chemotherapy  (4) postmastectomy radiation not indicated  (5) started anastrozole September 2017, completing five years November 2022  (a) DEXA scan 04/17/2017 showed a  T score of -1.8   (B DEXA scan 01/26/2020) showed a T score of -2.2.  (6) since last visit she had a dermatology appointment on December 12 and had some lesions noted which were shave biopsied from the left chest wall. Pathology from that showed dermal deposits of metastatic breast adenocarcinoma extending to the edge and base, ER +95% 3+ PR 80% 3+ HER2 2+ by IHC.  I am not sure a FISH has been done.  In the past she had not HER2 amplified tumor.  PLAN:  We have reviewed her recent pathology report from the shave biopsy which showed ER/PR positive invasive adenocarcinoma of the breast, HER2 equivocal by IHC, FISH not available.  In the past she did not have a HER2 amplified tumor.  She stopped anastrozole last year. We have discussed about considering systemic staging given the recurrence, ordered a PET/CT.  If this is denied, we can attempt a CT chest and a bone scan for staging.  If there is no other evidence of metastatic disease, she will meet with Dr.  Donne Hazel for discussion about surgical resection.    In the interim I recommended that we start her on Faslodex.  We have discussed about options for antiestrogen therapy including tamoxifen however given her sedentary status I do not believe she will be a good candidate for tamoxifen especially with the increased risk of DVT/PE.  I have ordered Faslodex and discussed about role of Faslodex as antiestrogen therapy.  All her questions were answered to the best my knowledge.  She will return to clinic in about 2 weeks to review results and to discuss additional recommendations.  Total time spent: 40 minutes  *Total Encounter Time as defined by the Centers for Medicare and Medicaid Services includes, in addition to the face-to-face time of a patient visit (documented in the note above) non-face-to-face time: obtaining and reviewing outside history, ordering and reviewing medications, tests or procedures, care coordination (communications with other health care professionals or caregivers) and documentation in the medical record.

## 2022-09-21 NOTE — Telephone Encounter (Signed)
Called pt spouse [name] to make aware of PET scan appt on 1/24 at 9:30 and scan to will start at 10am. Advised nothing to eat or drink 6 hours prior to appt. Pt spouse verbalized understanding.

## 2022-09-21 NOTE — Telephone Encounter (Signed)
Spoke with pathology department from [place] regarding Her2 equiv results. This was never ordered. I have sent a request for this to be ordered.

## 2022-09-25 DIAGNOSIS — D492 Neoplasm of unspecified behavior of bone, soft tissue, and skin: Secondary | ICD-10-CM | POA: Diagnosis not present

## 2022-09-25 DIAGNOSIS — I10 Essential (primary) hypertension: Secondary | ICD-10-CM | POA: Diagnosis not present

## 2022-09-25 DIAGNOSIS — G894 Chronic pain syndrome: Secondary | ICD-10-CM | POA: Diagnosis not present

## 2022-09-25 DIAGNOSIS — Z7409 Other reduced mobility: Secondary | ICD-10-CM | POA: Diagnosis not present

## 2022-09-25 DIAGNOSIS — B354 Tinea corporis: Secondary | ICD-10-CM | POA: Diagnosis not present

## 2022-09-25 DIAGNOSIS — Z853 Personal history of malignant neoplasm of breast: Secondary | ICD-10-CM | POA: Diagnosis not present

## 2022-09-25 DIAGNOSIS — R739 Hyperglycemia, unspecified: Secondary | ICD-10-CM | POA: Diagnosis not present

## 2022-09-26 LAB — CANCER ANTIGEN 15-3: CA 15-3: 6.6 U/mL (ref 0.0–25.0)

## 2022-09-26 LAB — CANCER ANTIGEN 27.29: CA 27.29: <9 U/mL (ref 0.0–38.6)

## 2022-09-27 ENCOUNTER — Telehealth: Payer: Self-pay | Admitting: *Deleted

## 2022-09-27 ENCOUNTER — Telehealth: Payer: Self-pay | Admitting: Hematology and Oncology

## 2022-09-27 NOTE — Telephone Encounter (Signed)
Received Her2 FISH results - NEG Will scan copy of path

## 2022-09-27 NOTE — Telephone Encounter (Signed)
Scheduled appointments per los. Talked with the patients husband Debra Griffith and he is aware of all made appointments.

## 2022-09-28 ENCOUNTER — Inpatient Hospital Stay: Payer: Medicare Other

## 2022-09-28 VITALS — BP 162/76 | HR 72 | Temp 98.1°F | Resp 16

## 2022-09-28 DIAGNOSIS — C50212 Malignant neoplasm of upper-inner quadrant of left female breast: Secondary | ICD-10-CM | POA: Diagnosis not present

## 2022-09-28 DIAGNOSIS — Z7982 Long term (current) use of aspirin: Secondary | ICD-10-CM | POA: Diagnosis not present

## 2022-09-28 DIAGNOSIS — K219 Gastro-esophageal reflux disease without esophagitis: Secondary | ICD-10-CM | POA: Diagnosis not present

## 2022-09-28 DIAGNOSIS — M069 Rheumatoid arthritis, unspecified: Secondary | ICD-10-CM | POA: Diagnosis not present

## 2022-09-28 DIAGNOSIS — Z170 Estrogen receptor positive status [ER+]: Secondary | ICD-10-CM | POA: Diagnosis not present

## 2022-09-28 DIAGNOSIS — C7989 Secondary malignant neoplasm of other specified sites: Secondary | ICD-10-CM | POA: Diagnosis not present

## 2022-09-28 DIAGNOSIS — G8929 Other chronic pain: Secondary | ICD-10-CM | POA: Diagnosis not present

## 2022-09-28 DIAGNOSIS — I129 Hypertensive chronic kidney disease with stage 1 through stage 4 chronic kidney disease, or unspecified chronic kidney disease: Secondary | ICD-10-CM | POA: Diagnosis not present

## 2022-09-28 DIAGNOSIS — G4700 Insomnia, unspecified: Secondary | ICD-10-CM | POA: Diagnosis not present

## 2022-09-28 DIAGNOSIS — Z79899 Other long term (current) drug therapy: Secondary | ICD-10-CM | POA: Diagnosis not present

## 2022-09-28 DIAGNOSIS — I4891 Unspecified atrial fibrillation: Secondary | ICD-10-CM | POA: Diagnosis not present

## 2022-09-28 MED ORDER — FULVESTRANT 250 MG/5ML IM SOSY
500.0000 mg | PREFILLED_SYRINGE | Freq: Once | INTRAMUSCULAR | Status: AC
  Administered 2022-09-28: 500 mg via INTRAMUSCULAR
  Filled 2022-09-28: qty 10

## 2022-09-28 NOTE — Patient Instructions (Signed)

## 2022-10-05 DIAGNOSIS — C50912 Malignant neoplasm of unspecified site of left female breast: Secondary | ICD-10-CM | POA: Diagnosis not present

## 2022-10-05 DIAGNOSIS — C7989 Secondary malignant neoplasm of other specified sites: Secondary | ICD-10-CM | POA: Diagnosis not present

## 2022-10-09 ENCOUNTER — Other Ambulatory Visit: Payer: Self-pay | Admitting: General Surgery

## 2022-10-09 DIAGNOSIS — Z853 Personal history of malignant neoplasm of breast: Secondary | ICD-10-CM

## 2022-10-10 ENCOUNTER — Encounter (HOSPITAL_COMMUNITY)
Admission: RE | Admit: 2022-10-10 | Discharge: 2022-10-10 | Disposition: A | Discharge status: Home / Self Care | Payer: Medicare Other | Source: Ambulatory Visit | Attending: Hematology and Oncology | Admitting: Hematology and Oncology

## 2022-10-10 DIAGNOSIS — C50212 Malignant neoplasm of upper-inner quadrant of left female breast: Secondary | ICD-10-CM | POA: Insufficient documentation

## 2022-10-10 DIAGNOSIS — C50919 Malignant neoplasm of unspecified site of unspecified female breast: Secondary | ICD-10-CM | POA: Diagnosis not present

## 2022-10-10 DIAGNOSIS — K439 Ventral hernia without obstruction or gangrene: Secondary | ICD-10-CM | POA: Diagnosis not present

## 2022-10-10 DIAGNOSIS — Z170 Estrogen receptor positive status [ER+]: Secondary | ICD-10-CM | POA: Insufficient documentation

## 2022-10-10 DIAGNOSIS — K429 Umbilical hernia without obstruction or gangrene: Secondary | ICD-10-CM | POA: Diagnosis not present

## 2022-10-10 DIAGNOSIS — K8020 Calculus of gallbladder without cholecystitis without obstruction: Secondary | ICD-10-CM | POA: Diagnosis not present

## 2022-10-10 LAB — GLUCOSE, CAPILLARY: Glucose-Capillary: 133 mg/dL — ABNORMAL HIGH (ref 70–99)

## 2022-10-10 MED ORDER — FLUDEOXYGLUCOSE F - 18 (FDG) INJECTION
14.0000 | Freq: Once | INTRAVENOUS | Status: AC
  Administered 2022-10-10: 13.85 via INTRAVENOUS

## 2022-10-12 ENCOUNTER — Inpatient Hospital Stay: Payer: Medicare Other | Admitting: Hematology and Oncology

## 2022-10-12 ENCOUNTER — Other Ambulatory Visit: Payer: Self-pay

## 2022-10-12 ENCOUNTER — Inpatient Hospital Stay: Payer: Medicare Other

## 2022-10-12 ENCOUNTER — Encounter: Payer: Self-pay | Admitting: Hematology and Oncology

## 2022-10-12 VITALS — BP 165/79 | HR 91 | Temp 98.1°F | Resp 16 | Ht 62.0 in | Wt 299.7 lb

## 2022-10-12 DIAGNOSIS — Z170 Estrogen receptor positive status [ER+]: Secondary | ICD-10-CM

## 2022-10-12 DIAGNOSIS — I4891 Unspecified atrial fibrillation: Secondary | ICD-10-CM | POA: Diagnosis not present

## 2022-10-12 DIAGNOSIS — C50212 Malignant neoplasm of upper-inner quadrant of left female breast: Secondary | ICD-10-CM

## 2022-10-12 DIAGNOSIS — G8929 Other chronic pain: Secondary | ICD-10-CM | POA: Diagnosis not present

## 2022-10-12 DIAGNOSIS — I129 Hypertensive chronic kidney disease with stage 1 through stage 4 chronic kidney disease, or unspecified chronic kidney disease: Secondary | ICD-10-CM | POA: Diagnosis not present

## 2022-10-12 DIAGNOSIS — Z79899 Other long term (current) drug therapy: Secondary | ICD-10-CM | POA: Diagnosis not present

## 2022-10-12 DIAGNOSIS — G4700 Insomnia, unspecified: Secondary | ICD-10-CM | POA: Diagnosis not present

## 2022-10-12 DIAGNOSIS — C7989 Secondary malignant neoplasm of other specified sites: Secondary | ICD-10-CM | POA: Diagnosis not present

## 2022-10-12 DIAGNOSIS — M069 Rheumatoid arthritis, unspecified: Secondary | ICD-10-CM | POA: Diagnosis not present

## 2022-10-12 DIAGNOSIS — Z7982 Long term (current) use of aspirin: Secondary | ICD-10-CM | POA: Diagnosis not present

## 2022-10-12 DIAGNOSIS — K219 Gastro-esophageal reflux disease without esophagitis: Secondary | ICD-10-CM | POA: Diagnosis not present

## 2022-10-12 LAB — CBC WITH DIFFERENTIAL/PLATELET
Abs Immature Granulocytes: 0.02 10*3/uL (ref 0.00–0.07)
Basophils Absolute: 0 10*3/uL (ref 0.0–0.1)
Basophils Relative: 0 %
Eosinophils Absolute: 0.3 10*3/uL (ref 0.0–0.5)
Eosinophils Relative: 4 %
HCT: 42.1 % (ref 36.0–46.0)
Hemoglobin: 13.6 g/dL (ref 12.0–15.0)
Immature Granulocytes: 0 %
Lymphocytes Relative: 24 %
Lymphs Abs: 1.7 10*3/uL (ref 0.7–4.0)
MCH: 29.1 pg (ref 26.0–34.0)
MCHC: 32.3 g/dL (ref 30.0–36.0)
MCV: 90 fL (ref 80.0–100.0)
Monocytes Absolute: 0.6 10*3/uL (ref 0.1–1.0)
Monocytes Relative: 8 %
Neutro Abs: 4.4 10*3/uL (ref 1.7–7.7)
Neutrophils Relative %: 64 %
Platelets: 228 10*3/uL (ref 150–400)
RBC: 4.68 MIL/uL (ref 3.87–5.11)
RDW: 13.6 % (ref 11.5–15.5)
WBC: 7 10*3/uL (ref 4.0–10.5)
nRBC: 0 % (ref 0.0–0.2)

## 2022-10-12 LAB — COMPREHENSIVE METABOLIC PANEL
ALT: 20 U/L (ref 0–44)
AST: 20 U/L (ref 15–41)
Albumin: 3.8 g/dL (ref 3.5–5.0)
Alkaline Phosphatase: 98 U/L (ref 38–126)
Anion gap: 6 (ref 5–15)
BUN: 20 mg/dL (ref 8–23)
CO2: 30 mmol/L (ref 22–32)
Calcium: 9.8 mg/dL (ref 8.9–10.3)
Chloride: 105 mmol/L (ref 98–111)
Creatinine, Ser: 1 mg/dL (ref 0.44–1.00)
GFR, Estimated: 57 mL/min — ABNORMAL LOW (ref >60)
Glucose, Bld: 169 mg/dL — ABNORMAL HIGH (ref 70–99)
Potassium: 3.4 mmol/L — ABNORMAL LOW (ref 3.5–5.1)
Sodium: 141 mmol/L (ref 135–145)
Total Bilirubin: 0.4 mg/dL (ref 0.3–1.2)
Total Protein: 6.8 g/dL (ref 6.5–8.1)

## 2022-10-12 MED ORDER — FULVESTRANT 250 MG/5ML IM SOSY
500.0000 mg | PREFILLED_SYRINGE | Freq: Once | INTRAMUSCULAR | Status: AC
  Administered 2022-10-12: 500 mg via INTRAMUSCULAR
  Filled 2022-10-12: qty 10

## 2022-10-12 NOTE — Progress Notes (Signed)
[place]  [phone] [phone]     ID: Debra Griffith DOB: 1941-09-17  MR#: [id number]  JYN#:[id number]  Patient Care Team: Josetta Huddle, MD as PCP - General (Internal Medicine) Rolm Bookbinder, MD as Consulting Physician (General Surgery) Irene Limbo, MD as Consulting Physician (Plastic Surgery) Gaynelle Arabian, MD as Consulting Physician (Orthopedic Surgery) Bo Merino, MD as Consulting Physician (Rheumatology) Delice Bison, Charlestine Massed, NP as Nurse Practitioner (Hematology and Oncology) Bjorn Loser, MD as Consulting Physician (Urology) Neldon Labella, RN as Case Manager Benay Pike, MD as Consulting Physician (Hematology and Oncology) OTHER MD:   CHIEF COMPLAINT: Estrogen receptor positive breast cancer (s/p left mastectomy)  CURRENT TREATMENT: None  INTERVAL HISTORY:  Laramie returns today for follow-up of her estrogen receptor positive breast cancer.  She was last seen in November 2022 by Dr. Jana Hakim and was discharged since she completed 5 years of antiestrogen therapy.  Most recently she went to her dermatologist, had a shave biopsy of the skin lesion on the left chest wall and this resulted as metastatic breast cancer.  We tried to see her before Christmas as soon as we got the results however we were unable to reach her.  She finally was able to come in and is here after her most recent  PET which is without any metastatic disease.    COVID 19 VACCINATION STATUS: Pfizer x3 as of November 2022   BREAST CANCER HISTORY: From the original intake note:  Candiss had minor trauma to the left breast but did not think much about it until while swimming on the medication she felt a hard mass in her left breast. She brought it to medical attention and on 05/04/2016 she underwent left diagnostic mammography with tomography at the [facility] Center. This showed the breast density to be category B. In the left breast upper  inner quadrant there was a persistent area of asymmetry measuring 4.9 cm. On exam this was a firm but poorly defined palpable mass. Biopsy of this mass 05/04/2016 showed ([id number]) an invasive ductal carcinoma, grade 1, estrogen receptor 90% positive with moderate staining intensity, progesterone receptor 90% positive with strong staining intensity, with an MIB-1 of 10%, and no HER-2 amplification, the signals ratio being 1.19 and the number per cell 1.90.  On 05/09/2016 the patient had ultrasonography of the left axilla which was benign. On the same day she had a second biopsy of what is either a second mass or a distant area of the same mass (in the same quadrant) and this showed ([id number]) invasive ductal carcinoma, grade 2, estrogen receptor 100% positive, progesterone receptor 90% positive, both with strong staining intensity, with an MIB-1 of 15%, and no HER-2 amplification, the signals ratio being 1.50 and the number per cell 3.15.  The patient's subsequent history is as detailed below.   PAST MEDICAL HISTORY: Past Medical History:  Diagnosis Date   Anemia    hx of    Anxiety    Breast cancer in female [facility]) 03/2016   left   Chronic bronchitis ([place])    FLARE UPS USUALLY ONCE A YEAR   Chronic lower back pain    Complication of anesthesia    trouble waking up    CRI (chronic renal insufficiency) 07/27/2016   DDD (degenerative disc disease), lumbar    Depression    GERD (gastroesophageal reflux disease)    History of recent fall    "twice on Sunday; once yesterday" (08/29/2016)   Hypertension  Inflammatory arthritis 07/27/2016   Sero Negative, Positive Synovitis hands, WJ   Insomnia    Irregular heart beats    Macular degeneration of right eye    New onset atrial fibrillation (HCC)    OA (osteoarthritis) of hip 06/24/2012   OA (osteoarthritis) of knee 11/02/2013   L knee    Obesity    Osteoarthritis of both feet 07/27/2016   Osteoarthritis of both hands 07/27/2016    PONV (postoperative nausea and vomiting)    SEVERE N&V AND DIARRHEA AFTER HYSTERECTOMY AND AFTER WISDOM TEETH EXTRACTIONS - NO PROBLEMS WITH LAST 2 SURGERIES - THE HIP AND LEFT KNEE   Postop Acute blood loss anemia 06/25/2012   Postop Hyponatremia 06/25/2012   Postop Sinus tachycardia 06/27/2012   RBBB (right bundle branch block) 07/27/2016   Rheumatoid arthritis ([place])    "doctor recently took me off all RX for this" (08/02/2016)   Right bundle branch block    Right bundle branch block    S/p dental crown    dental crowns on every tooth   Status post total bilateral knee replacement 11/13/2013   Vitamin D deficiency     PAST SURGICAL HISTORY: Past Surgical History:  Procedure Laterality Date   AXILLARY SURGERY Left 06/25/2016   Aspiration of left axillary seroma    BREAST BIOPSY Left 03/2016   BREAST LUMPECTOMY WITH RADIOACTIVE SEED AND SENTINEL LYMPH NODE BIOPSY Left 06/13/2016   Procedure: LEFT BREAST LUMPECTOMY WITH BRACKETED  RADIOACTIVE SEED AND SENTINEL LYMPH NODE BIOPSY;  Surgeon: Rolm Bookbinder, MD;  Location: [place];  Service: General;  Laterality: Left;   BREAST RECONSTRUCTION Bilateral 06/25/2016   BILATERAL ONCOPLASTIC BREAST RECONSTRUCTION WITH BREAST REDUCTION   BREAST RECONSTRUCTION WITH PLACEMENT OF TISSUE EXPANDER AND FLEX HD (ACELLULAR HYDRATED DERMIS) Bilateral 06/25/2016   Procedure: BILATERAL ONCOPLASTIC BREAST RECONSTRUCTION WITH BREAST REDUCTION, Aspiration of left axillary seroma;  Surgeon: Irene Limbo, MD;  Location: [place];  Service: Plastics;  Laterality: Bilateral;   BREAST REDUCTION SURGERY Bilateral 06/25/2016   Procedure: MAMMARY REDUCTION  (BREAST) BILATERAL;  Surgeon: Irene Limbo, MD;  Location: [place];  Service: Plastics;  Laterality: Bilateral;   CESAREAN SECTION  1966; 1971; 1973   COLONOSCOPY     DEBRIDEMENT AND CLOSURE WOUND Right 07/11/2016   Procedure: DEBRIDEMENT LEFT BREAST;  Surgeon: Irene Limbo, MD;  Location: [place];  Service:  Plastics;  Laterality: Right;   EYE SURGERY     INTERSTIM IMPLANT PLACEMENT N/A 08/29/2021   Procedure: Barrie [place] IMPLANT FIRST STAGE;  Surgeon: Bjorn Loser, MD;  Location[facility];  Service: Urology;  Laterality: N/A;   INTERSTIM IMPLANT PLACEMENT N/A 08/29/2021   Procedure: Barrie Lyme IMPLANT SECOND STAGE AND IMPEDANCE CHECK;  Surgeon: Bjorn Loser, MD;  Location: [facility];  Service: Urology;  Laterality: N/A;   JOINT REPLACEMENT     KNEE ARTHROSCOPY Right    "before replacement"   MASTECTOMY COMPLETE / SIMPLE Left 08/02/2016   total   REDUCTION MAMMAPLASTY Bilateral 06/25/2016   TOTAL ABDOMINAL HYSTERECTOMY W/ BILATERAL SALPINGOOPHORECTOMY     TOTAL HIP ARTHROPLASTY  06/24/2012   Procedure: TOTAL HIP ARTHROPLASTY;  Surgeon: [name], MD;  [facility];  Service: Orthopedics;  Laterality: Left;   TOTAL KNEE ARTHROPLASTY N/A 11/02/2013   Procedure: LEFT TOTAL KNEE ARTHROPLASTY WITH RIGHT KNEE CORTISONE INJECTION;  Surgeon: [name], MD;  [facility];  Service: Orthopedics;  Laterality: N/A;   TOTAL KNEE ARTHROPLASTY Right 05/31/2014   Procedure: RIGHT TOTAL KNEE ARTHROPLASTY;  Surgeon: Pilar Plate  Zella Ball, MD;  [facility];  Service: Orthopedics;  Laterality: Right;   TOTAL MASTECTOMY Left 08/02/2016   Procedure: LEFT TOTAL MASTECTOMY;  Surgeon: Rolm Bookbinder, MD;  [facility];  Service: General;  Laterality: Left;   [place] EXTRACTION  1970's   admitted to hospital for surgery    FAMILY HISTORY Family History  Problem Relation Age of Onset   Hypertension Mother    Prostate cancer Father    Hypertension Father    Heart Problems Father        quadruple bypass   Hypertension Sister    The patient's mother died at age 64. The patient's father died at age 66 following a stroke. The patient had no brothers, 1 sister. There is no history of breast or ovarian cancer in the family.    GYNECOLOGIC HISTORY:  No LMP  recorded. Patient has had a hysterectomy. Menarche age 27, first live birth age 89. The patient is GX P3. She underwent total abdominal hysterectomy, with bilateral salpingo-oophorectomy, in 1993. She used estrogen replacement approximately 9 months. She never used oral contraceptives.   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 Her husband [name] used to r be in businesses but he also is retired. Son Aaron Edelman is a Programme researcher, broadcasting/film/video in [place]. Son Legrand Como is a Engineer, maintenance (IT) in [employer]. Daughter Karron Goens lives in [place]. 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 The patient has 5 grandchildren. She is a Nurse, learning disability, currently attending [employer]: In place    HEALTH MAINTENANCE: Social History   Tobacco Use   Smoking status: Never   Smokeless tobacco: Never  Vaping Use   Vaping Use: Never used  Substance Use Topics   Alcohol use: No   Drug use: Yes    Types: Other-see comments    Comment: CBD oil     Colonoscopy: 2015/[facility]  PAP:  Bone density[facility] that showed: T-score of -1.8.    Allergies  Allergen Reactions   Morphine And Related Other (See Comments)    hallucinations    Current Outpatient Medications  Medication Sig Dispense Refill   aspirin EC 81 MG tablet Take 81 mg by mouth daily. Swallow whole.     Cholecalciferol (VITAMIN D) 125 MCG (5000 UT) CAPS Take 5,000 Units by mouth daily.     clonazePAM (KLONOPIN) 1 MG tablet Take 1 mg by mouth 2 (two) times daily.     Cyanocobalamin (B-12) 2500 MCG TABS Take 2,500 mcg by mouth daily.     cyclobenzaprine (FLEXERIL) 10 MG tablet Take 20 mg by mouth at bedtime.     fluorouracil (EFUDEX) 5 % cream Apply 1 application topically daily as needed (dark spots).     HYDROcodone-acetaminophen (NORCO) 10-325 MG tablet Take 1 tablet by mouth 4 (four) times daily as needed for moderate pain.      metoprolol tartrate (LOPRESSOR) 25 MG tablet Take 25 mg by mouth 2 (two) times daily.      Multiple Vitamins-Minerals (PRESERVISION AREDS 2+MULTI VIT PO) Take 1 capsule by mouth in the morning and at bedtime.     nitrofurantoin (MACRODANTIN) 100 MG capsule Take 1 capsule (100 mg total) by mouth at bedtime.     OVER THE COUNTER MEDICATION Take 1 tablet by mouth daily. Bone up     OVER THE COUNTER MEDICATION Take  2 tablets by mouth daily as needed (constipation). Swiss Kriss     rosuvastatin (CRESTOR) 5 MG tablet Take 5 mg by mouth daily.     sulfamethoxazole-trimethoprim (BACTRIM DS) 800-160 MG tablet Take 1 tablet by mouth 2 (two) times daily. 10 tablet 0   triamterene-hydrochlorothiazide (MAXZIDE-25) 37.5-25 MG tablet Take 1 tablet by mouth daily.     valsartan (DIOVAN) 320 MG tablet Take 320 mg by mouth daily.     venlafaxine XR (EFFEXOR-XR) 75 MG 24 hr capsule Take 1 capsule (75 mg total) by mouth 2 (two) times daily. 180 capsule 4   zolpidem (AMBIEN) 10 MG tablet Take 10 mg by mouth at bedtime.     No current facility-administered medications for this visit.   Facility-Administered Medications Ordered in Other Visits  Medication Dose Route Frequency Provider Last Rate Last Admin   methocarbamol (ROBAXIN) tablet 500 mg  500 mg Oral Q6H PRN Rolm Bookbinder, MD       metroNIDAZOLE (FLAGYL) tablet 500 mg  500 mg Oral Q8H Rolm Bookbinder, MD       ondansetron (ZOFRAN-ODT) disintegrating tablet 4 mg  4 mg Oral Q6H PRN Rolm Bookbinder, MD       Or   ondansetron [facility]) 4 mg in sodium chloride 0.9 % 50 mL IVPB  4 mg Intravenous Q6H PRN Rolm Bookbinder, MD       oxyCODONE (Oxy IR/ROXICODONE) immediate release tablet 5-10 mg  5-10 mg Oral Q4H PRN Rolm Bookbinder, MD       simethicone [facility]) chewable tablet 40 mg  40 mg Oral Q6H PRN Rolm Bookbinder, MD        OBJECTIVE: White woman using a Rollator  Vitals:   10/12/22 1144  BP: (!) 165/79  Pulse: 91  Resp: 16  Temp:  98.1 F (36.7 C)  SpO2: 94%     Wt Readings from Last 3 Encounters:  10/12/22 299 lb 11.2 oz (135.9 kg)  08/29/21 278 lb (126.1 kg)  08/23/21 278 lb (126.1 kg)   Body mass index is 54.82 kg/m.    ECOG FS:1 - Symptomatic but completely ambulatory  She is in a wheel chair, comfortable and in no acute distress. No lower extremity edema  LAB RESULTS:  CMP     Component Value Date/Time   NA 141 09/21/2022 1024   NA 140 07/04/2017 1112   K 3.8 09/21/2022 1024   K 4.0 07/04/2017 1112   CL 104 09/21/2022 1024   CO2 31 09/21/2022 1024   CO2 28 07/04/2017 1112   GLUCOSE 139 (H) 09/21/2022 1024   GLUCOSE 106 07/04/2017 1112   BUN 20 09/21/2022 1024   BUN 26.7 (H) 07/04/2017 1112   CREATININE 1.02 (H) 09/21/2022 1024   CREATININE 0.99 08/14/2021 1120   CREATININE 1.1 07/04/2017 1112   CALCIUM 9.6 09/21/2022 1024   CALCIUM 9.8 07/04/2017 1112   PROT 6.1 (L) 09/21/2022 1024   PROT 6.9 07/04/2017 1112   ALBUMIN 3.9 09/21/2022 1024   ALBUMIN 3.8 07/04/2017 1112   AST 20 09/21/2022 1024   AST 20 08/14/2021 1120   AST 21 07/04/2017 1112   ALT 20 09/21/2022 1024   ALT 24 08/14/2021 1120   ALT 29 07/04/2017 1112   ALKPHOS 99 09/21/2022 1024   ALKPHOS 87 07/04/2017 1112   BILITOT 0.3 09/21/2022 1024   BILITOT 0.5 08/14/2021 1120   BILITOT 0.37 07/04/2017 1112   GFRNONAA 56 (L) 09/21/2022 1024   GFRNONAA 58 (L) 08/14/2021 1120   GFRAA >60 06/08/2020  0857   GFRAA 53 (L) 02/05/2018 1012    INo results found for: "SPEP", "UPEP"  Lab Results  Component Value Date   WBC 7.0 10/12/2022   NEUTROABS 4.4 10/12/2022   HGB 13.6 10/12/2022   HCT 42.1 10/12/2022   MCV 90.0 10/12/2022   PLT 228 10/12/2022      Chemistry      Component Value Date/Time   NA 141 09/21/2022 1024   NA 140 07/04/2017 1112   K 3.8 09/21/2022 1024   K 4.0 07/04/2017 1112   CL 104 09/21/2022 1024   CO2 31 09/21/2022 1024   CO2 28 07/04/2017 1112   BUN 20 09/21/2022 1024   BUN 26.7 (H)  07/04/2017 1112   CREATININE 1.02 (H) 09/21/2022 1024   CREATININE 0.99 08/14/2021 1120   CREATININE 1.1 07/04/2017 1112   GLU 95 01/20/2016 0000      Component Value Date/Time   CALCIUM 9.6 09/21/2022 1024   CALCIUM 9.8 07/04/2017 1112   ALKPHOS 99 09/21/2022 1024   ALKPHOS 87 07/04/2017 1112   AST 20 09/21/2022 1024   AST 20 08/14/2021 1120   AST 21 07/04/2017 1112   ALT 20 09/21/2022 1024   ALT 24 08/14/2021 1120   ALT 29 07/04/2017 1112   BILITOT 0.3 09/21/2022 1024   BILITOT 0.5 08/14/2021 1120   BILITOT 0.37 07/04/2017 1112       No results found for: "LABCA2"  No components found for: "LABCA125"  No results for input(s): "INR" in the last 168 hours.  Urinalysis    Component Value Date/Time   COLORURINE YELLOW 08/29/2016 2120   APPEARANCEUR HAZY (A) 08/29/2016 2120   LABSPEC 1.014 08/29/2016 2120   PHURINE 5.0 08/29/2016 2120   GLUCOSEU NEGATIVE 08/29/2016 2120   HGBUR NEGATIVE 08/29/2016 2120   BILIRUBINUR n 01/25/2020 1101   KETONESUR NEGATIVE 08/29/2016 2120   PROTEINUR Negative 01/25/2020 1101   PROTEINUR NEGATIVE 08/29/2016 2120   UROBILINOGEN negative (A) 01/25/2020 1101   UROBILINOGEN 0.2 05/26/2014 1054   NITRITE n 01/25/2020 1101   NITRITE NEGATIVE 08/29/2016 2120   LEUKOCYTESUR Small (1+) (A) 01/25/2020 1101    STUDIES: NM PET Image Initial (PI) Skull Base To Thigh  Result Date: 10/11/2022 CLINICAL DATA:  Initial treatment strategy for breast cancer, history of disease recurrence in a patient with history of LEFT breast cancer. Post mastectomy in 2017. Recent skin biopsy reportedly with recurrent disease. EXAM: NUCLEAR MEDICINE PET SKULL BASE TO THIGH TECHNIQUE: 13.85 mCi F-18 FDG was injected intravenously. Full-ring PET imaging was performed from the skull base to thigh after the radiotracer. CT data was obtained and used for attenuation correction and anatomic localization. Fasting blood glucose: 133 mg/dl COMPARISON:  None available FINDINGS:  Mediastinal blood pool activity: SUV max 2.43 Liver activity: SUV max NA NECK: No hypermetabolic lymph nodes in the neck. Incidental CT findings: None. CHEST: Mild skin thickening over the medial LEFT anterior chest following mastectomy. No focal nodular area. Maximum SUV in this area on image 60/4) 1.8. No adenopathy in the chest or suspicious pulmonary nodules. No frank chest wall mass. Incidental CT findings: Aortic atherosclerosis. No signs of aneurysm. No pericardial effusion or nodularity. No adenopathy by size criteria in the chest. Post LEFT mastectomy as discussed ABDOMEN/PELVIS: No abnormal hypermetabolic activity within the liver, pancreas, adrenal glands, or spleen. No hypermetabolic lymph nodes in the abdomen or pelvis. Incidental CT findings: Moderate to marked hepatic steatosis with areas of geographic fatty sparing. Cholelithiasis. Aortic atherosclerosis without aneurysmal dilation.  No adenopathy by size criteria in the abdomen or the pelvis. Post hysterectomy. Abdominal wall laxity and fat containing periumbilical hernia. Sacral nerve stimulator in place, power pack over the LEFT gluteal region. SKELETON: No focal hypermetabolic activity to suggest skeletal metastasis. Incidental CT findings: None. IMPRESSION: 1. No signs of FDG avid disease to indicate focal area of recurrence. 2. Mild skin thickening over the medial LEFT anterior chest following mastectomy. No focal nodular area. Only mild FDG uptake in this area. 3. Moderate to marked hepatic steatosis with areas of geographic fatty sparing. 4. Cholelithiasis. 5. Aortic atherosclerosis. 6. Abdominal wall laxity and fat containing periumbilical hernia. Aortic Atherosclerosis (ICD10-I70.0). Electronically Signed   By: Zetta Bills M.D.   On: 10/11/2022 10:13     ELIGIBLE FOR AVAILABLE RESEARCH PROTOCOL: no  ASSESSMENT: 82 y.o. [place] woman status post left breast upper inner quadrant biopsy 05/04/2016 for a clinical T2 N0 invasive ductal  carcinoma, grade 1 estrogen and progesterone receptor positive, HER-2 nonamplified, with an MIB-1 of 10%.  (1) biopsy of a second area 05/09/2016 also in the upper inner quadrant of the left breast showed invasive ductal carcinoma, grade 2, estrogen and progesterone receptor positive, HER-2 nonamplified, with an MIB-1 of 15%.  (2) status post left lumpectomy and sentinel lymph node sampling 06/13/2016 for an mpT1c pN0(i+), stage IA invasive ductal carcinoma, grade 1, with close but negative margins  (a) status post bilateral reduction mammoplasty with left oncoplastic surgery 06/25/2016  (b) status post debridement of left nipple/areolar necrosis 07/11/2016  (c) status post left simple mastectomy 08/02/2016 showing inflammation and abscess but no evidence of malignancy  (3) Oncotype score of 18 predicts a 10 year risk of recurrence outside the breast of 11% if the patient's only systemic therapy is tamoxifen for 5 years. It also predicts no significant benefit from chemotherapy  (4) postmastectomy radiation not indicated  (5) started anastrozole September 2017, completing five years November 2022  (a) DEXA scan 04/17/2017 showed a T score of -1.8   (B DEXA scan 01/26/2020) showed a T score of -2.2.  (6) since last visit she had a dermatology appointment on December 12 and had some lesions noted which were shave biopsied from the left chest wall. Pathology from that showed dermal deposits of metastatic breast adenocarcinoma extending to the edge and base, ER +95% 3+ PR 80% 3+ HER2 2+ by IHC.  Her 2 FISH negative.  PLAN:  We have reviewed her recent pathology report from the shave biopsy which showed ER/PR positive invasive adenocarcinoma of the breast, HER2 equivocal by IHC, FISH negative.   PET scan with no metastatic disease.  She will continue on faslodex in the interim. I didn't consider Tamoxifen given her sedentary status but this is certainly an option in the future. Dr Donne Hazel  recommended [place] to assess the local extent of disease.  She will proceed with surgery when planned and FU with me in one month. All her questions were answered to the best my knowledge.  She will return to clinic in about 2 weeks to review results and to discuss additional recommendations.  Total time spent: 30 minutes  *Total Encounter Time as defined by the Centers for Medicare and Medicaid Services includes, in addition to the face-to-face time of a patient visit (documented in the note above) non-face-to-face time: obtaining and reviewing outside history, ordering and reviewing medications, tests or procedures, care coordination (communications with other health care professionals or caregivers) and documentation in the medical record.

## 2022-10-15 DIAGNOSIS — C44212 Basal cell carcinoma of skin of right ear and external auricular canal: Secondary | ICD-10-CM | POA: Diagnosis not present

## 2022-10-17 ENCOUNTER — Ambulatory Visit
Admission: RE | Admit: 2022-10-17 | Discharge: 2022-10-17 | Disposition: A | Discharge status: Home / Self Care | Payer: Medicare Other | Source: Ambulatory Visit | Attending: General Surgery | Admitting: General Surgery

## 2022-10-17 DIAGNOSIS — N6489 Other specified disorders of breast: Secondary | ICD-10-CM | POA: Diagnosis not present

## 2022-10-17 DIAGNOSIS — Z853 Personal history of malignant neoplasm of breast: Secondary | ICD-10-CM

## 2022-10-24 DIAGNOSIS — T402X5A Adverse effect of other opioids, initial encounter: Secondary | ICD-10-CM | POA: Diagnosis not present

## 2022-10-24 DIAGNOSIS — M5459 Other low back pain: Secondary | ICD-10-CM | POA: Diagnosis not present

## 2022-10-24 DIAGNOSIS — G894 Chronic pain syndrome: Secondary | ICD-10-CM | POA: Diagnosis not present

## 2022-10-26 ENCOUNTER — Inpatient Hospital Stay: Payer: Medicare Other | Attending: Hematology and Oncology

## 2022-10-26 VITALS — BP 163/86 | HR 81 | Temp 98.2°F | Resp 18

## 2022-10-26 DIAGNOSIS — Z170 Estrogen receptor positive status [ER+]: Secondary | ICD-10-CM | POA: Insufficient documentation

## 2022-10-26 DIAGNOSIS — Z79818 Long term (current) use of other agents affecting estrogen receptors and estrogen levels: Secondary | ICD-10-CM | POA: Diagnosis not present

## 2022-10-26 DIAGNOSIS — C50212 Malignant neoplasm of upper-inner quadrant of left female breast: Secondary | ICD-10-CM | POA: Insufficient documentation

## 2022-10-26 MED ORDER — FULVESTRANT 250 MG/5ML IM SOSY
500.0000 mg | PREFILLED_SYRINGE | Freq: Once | INTRAMUSCULAR | Status: AC
  Administered 2022-10-26: 500 mg via INTRAMUSCULAR
  Filled 2022-10-26: qty 10

## 2022-10-26 NOTE — Patient Instructions (Signed)

## 2022-11-02 ENCOUNTER — Inpatient Hospital Stay: Payer: Medicare Other

## 2022-11-08 ENCOUNTER — Other Ambulatory Visit: Payer: Self-pay | Admitting: General Surgery

## 2022-11-09 ENCOUNTER — Inpatient Hospital Stay: Payer: Medicare Other

## 2022-11-09 ENCOUNTER — Inpatient Hospital Stay: Payer: Medicare Other | Admitting: Hematology and Oncology

## 2022-11-09 VITALS — BP 167/81 | HR 68 | Temp 98.8°F | Resp 16 | Ht 62.0 in | Wt 298.7 lb

## 2022-11-09 DIAGNOSIS — C50212 Malignant neoplasm of upper-inner quadrant of left female breast: Secondary | ICD-10-CM | POA: Diagnosis not present

## 2022-11-09 DIAGNOSIS — Z170 Estrogen receptor positive status [ER+]: Secondary | ICD-10-CM

## 2022-11-09 LAB — CBC WITH DIFFERENTIAL/PLATELET
Abs Immature Granulocytes: 0.03 10*3/uL (ref 0.00–0.07)
Basophils Absolute: 0 10*3/uL (ref 0.0–0.1)
Basophils Relative: 1 %
Eosinophils Absolute: 0.3 10*3/uL (ref 0.0–0.5)
Eosinophils Relative: 5 %
HCT: 41.5 % (ref 36.0–46.0)
Hemoglobin: 13.6 g/dL (ref 12.0–15.0)
Immature Granulocytes: 1 %
Lymphocytes Relative: 28 %
Lymphs Abs: 1.8 10*3/uL (ref 0.7–4.0)
MCH: 29.2 pg (ref 26.0–34.0)
MCHC: 32.8 g/dL (ref 30.0–36.0)
MCV: 89.2 fL (ref 80.0–100.0)
Monocytes Absolute: 0.5 10*3/uL (ref 0.1–1.0)
Monocytes Relative: 8 %
Neutro Abs: 3.7 10*3/uL (ref 1.7–7.7)
Neutrophils Relative %: 57 %
Platelets: 208 10*3/uL (ref 150–400)
RBC: 4.65 MIL/uL (ref 3.87–5.11)
RDW: 13.2 % (ref 11.5–15.5)
WBC: 6.4 10*3/uL (ref 4.0–10.5)
nRBC: 0 % (ref 0.0–0.2)

## 2022-11-09 LAB — COMPREHENSIVE METABOLIC PANEL
ALT: 18 U/L (ref 0–44)
AST: 21 U/L (ref 15–41)
Albumin: 3.9 g/dL (ref 3.5–5.0)
Alkaline Phosphatase: 95 U/L (ref 38–126)
Anion gap: 8 (ref 5–15)
BUN: 17 mg/dL (ref 8–23)
CO2: 28 mmol/L (ref 22–32)
Calcium: 9 mg/dL (ref 8.9–10.3)
Chloride: 103 mmol/L (ref 98–111)
Creatinine, Ser: 0.86 mg/dL (ref 0.44–1.00)
GFR, Estimated: >60 mL/min (ref >60)
Glucose, Bld: 148 mg/dL — ABNORMAL HIGH (ref 70–99)
Potassium: 3.5 mmol/L (ref 3.5–5.1)
Sodium: 139 mmol/L (ref 135–145)
Total Bilirubin: 0.4 mg/dL (ref 0.3–1.2)
Total Protein: 6.6 g/dL (ref 6.5–8.1)

## 2022-11-09 NOTE — Progress Notes (Signed)
Debra Griffith  [phone] [phone]     ID: LORRANE SURBER DOB: [birth year]  MR#: [id number]  [id number]  Patient Care Team: Josetta Huddle, MD as PCP - General (Internal Medicine) Rolm Bookbinder, MD as Consulting Physician (General Surgery) Irene Limbo, MD as Consulting Physician (Plastic Surgery) Gaynelle Arabian, MD as Consulting Physician (Orthopedic Surgery) Bo Merino, MD as Consulting Physician (Rheumatology) Delice Bison, Charlestine Massed, NP as Nurse Practitioner (Hematology and Oncology) Bjorn Loser, MD as Consulting Physician (Urology) Neldon Labella, RN as Case Manager Benay Pike, MD as Consulting Physician (Hematology and Oncology) OTHER MD:   CHIEF COMPLAINT: Estrogen receptor positive breast cancer (s/p left mastectomy)  CURRENT TREATMENT: None  INTERVAL HISTORY:  Manuel returns today for follow-up of her estrogen receptor positive breast cancer.  She was last seen in November 2022 by Dr. Jana Hakim and was discharged since she completed 5 years of antiestrogen therapy.  Most recently she went to her dermatologist, had a shave biopsy of the skin lesion on the left chest wall and this resulted as metastatic breast cancer.  We tried to see her before Christmas as soon as we got the results however we were unable to reach her.   PET didn't show any evidence of met disease.  She is now on faslodex for anti estrogen therapy. She is doing very well on Faslodex.  Since her last visit here she had an ultrasound and apparently had a conversation that she will be called back to schedule surgery by Dr. Donne Hazel.  She also had some surgery of the right ear for may be basal cell carcinoma, I do not have pathology for this. Rest of the pertinent 10 point ROS reviewed and neg.    COVID 19 VACCINATION STATUS: Pfizer x3 as of November 2022   BREAST CANCER HISTORY: From the original intake note:  Maeby had minor trauma  to the left breast but did not think much about it until while swimming on the medication she felt a hard mass in her left breast. She brought it to medical attention and on 05/04/2016 she underwent left diagnostic mammography with tomography at the [facility] Center. This showed the breast density to be category B. In the left breast upper inner quadrant there was a persistent area of asymmetry measuring 4.9 cm. On exam this was a firm but poorly defined palpable mass. Biopsy of this mass 05/04/2016 showed ([id number]) an invasive ductal carcinoma, grade 1, estrogen receptor 90% positive with moderate staining intensity, progesterone receptor 90% positive with strong staining intensity, with an MIB-1 of 10%, and no HER-2 amplification, the signals ratio being 1.19 and the number per cell 1.90.  On 05/09/2016 the patient had ultrasonography of the left axilla which was benign. On the same day she had a second biopsy of what is either a second mass or a distant area of the same mass (in the same quadrant) and this showed ([id number] [id number]) invasive ductal carcinoma, grade 2, estrogen receptor 100% positive, progesterone receptor 90% positive, both with strong staining intensity, with an MIB-1 of 15%, and no HER-2 amplification, the signals ratio being 1.50 and the number per cell 3.15.  The patient's subsequent history is as detailed below.   PAST MEDICAL HISTORY: Past Medical History:  Diagnosis Date   Anemia    hx of    Anxiety    Breast cancer in female [facility]) 03/2016   left   Chronic bronchitis ([place])    FLARE UPS USUALLY ONCE A YEAR  Chronic lower back pain    Complication of anesthesia    trouble waking up    CRI (chronic renal insufficiency) 07/27/2016   DDD (degenerative disc disease), lumbar    Depression    GERD (gastroesophageal reflux disease)    History of recent fall    "twice on Sunday; once yesterday" (08/29/2016)   Hypertension    Inflammatory arthritis 07/27/2016   Sero  Negative, Positive Synovitis hands, WJ   Insomnia    Irregular heart beats    Macular degeneration of right eye    New onset atrial fibrillation (HCC)    OA (osteoarthritis) of hip 06/24/2012   OA (osteoarthritis) of knee 11/02/2013   L knee    Obesity    Osteoarthritis of both feet 07/27/2016   Osteoarthritis of both hands 07/27/2016   PONV (postoperative nausea and vomiting)    SEVERE N&V AND DIARRHEA AFTER HYSTERECTOMY AND AFTER WISDOM TEETH EXTRACTIONS - NO PROBLEMS WITH LAST 2 SURGERIES - THE HIP AND LEFT KNEE   Postop Acute blood loss anemia 06/25/2012   Postop Hyponatremia 06/25/2012   Postop Sinus tachycardia 06/27/2012   RBBB (right bundle branch block) 07/27/2016   Rheumatoid arthritis ([place])    "doctor recently took me off all RX for this" (08/02/2016)   Right bundle branch block    Right bundle branch block    S/p dental crown    dental crowns on every tooth   Status post total bilateral knee replacement 11/13/2013   Vitamin D deficiency     PAST SURGICAL HISTORY: Past Surgical History:  Procedure Laterality Date   AXILLARY SURGERY Left 06/25/2016   Aspiration of left axillary seroma    BREAST BIOPSY Left 03/2016   BREAST LUMPECTOMY WITH RADIOACTIVE SEED AND SENTINEL LYMPH NODE BIOPSY Left 06/13/2016   Procedure: LEFT BREAST LUMPECTOMY WITH BRACKETED  RADIOACTIVE SEED AND SENTINEL LYMPH NODE BIOPSY;  Surgeon: Rolm Bookbinder, MD;  Location: [facility];  Service: General;  Laterality: Left;   BREAST RECONSTRUCTION Bilateral 06/25/2016   BILATERAL ONCOPLASTIC BREAST RECONSTRUCTION WITH BREAST REDUCTION   BREAST RECONSTRUCTION WITH PLACEMENT OF TISSUE EXPANDER AND FLEX HD (ACELLULAR HYDRATED DERMIS) Bilateral 06/25/2016   Procedure: BILATERAL ONCOPLASTIC BREAST RECONSTRUCTION WITH BREAST REDUCTION, Aspiration of left axillary seroma;  Surgeon: Irene Limbo, MD;  Location: [facility];  Service: Plastics;  Laterality: Bilateral;   BREAST REDUCTION SURGERY Bilateral 06/25/2016    Procedure: MAMMARY REDUCTION  (BREAST) BILATERAL;  Surgeon: Irene Limbo, MD;  Location: [facility];  Service: Plastics;  Laterality: Bilateral;   CESAREAN SECTION  1966; 1971; 1973   COLONOSCOPY     DEBRIDEMENT AND CLOSURE WOUND Right 07/11/2016   Procedure: DEBRIDEMENT LEFT BREAST;  Surgeon: Irene Limbo, MD;  Location: [facility];  Service: Plastics;  Laterality: Right;   EYE SURGERY     INTERSTIM IMPLANT PLACEMENT N/A 08/29/2021   Procedure: Barrie [place] IMPLANT FIRST STAGE;  Surgeon: Bjorn Loser, MD;  Location[facility];  Service: Urology;  Laterality: N/A;   INTERSTIM IMPLANT PLACEMENT N/A 08/29/2021   Procedure: Barrie Lyme IMPLANT SECOND STAGE AND IMPEDANCE CHECK;  Surgeon: Bjorn Loser, MD;  Location: [facility];  Service: Urology;  Laterality: N/A;   JOINT REPLACEMENT     KNEE ARTHROSCOPY Right    "before replacement"   MASTECTOMY COMPLETE / SIMPLE Left 08/02/2016   total   REDUCTION MAMMAPLASTY Bilateral 06/25/2016   TOTAL ABDOMINAL HYSTERECTOMY W/ BILATERAL SALPINGOOPHORECTOMY     TOTAL HIP ARTHROPLASTY  06/24/2012   Procedure: TOTAL HIP ARTHROPLASTY;  Surgeon: Dione Plover  Aluisio, MD;  [facility];  Service: Orthopedics;  Laterality: Left;   TOTAL KNEE ARTHROPLASTY N/A 11/02/2013   Procedure: LEFT TOTAL KNEE ARTHROPLASTY WITH RIGHT KNEE CORTISONE INJECTION;  Surgeon: [name], MD;  [facility];  Service: Orthopedics;  Laterality: N/A;   TOTAL KNEE ARTHROPLASTY Right 05/31/2014   Procedure: RIGHT TOTAL KNEE ARTHROPLASTY;  Surgeon: [name], MD;  [facility];  Service: Orthopedics;  Laterality: Right;   TOTAL MASTECTOMY Left 08/02/2016   Procedure: LEFT TOTAL MASTECTOMY;  Surgeon: Rolm Bookbinder, MD;  Location[facility];  Service: General;  Laterality: Left;   [place] EXTRACTION  1970's   admitted to hospital for surgery    FAMILY HISTORY Family History  Problem Relation Age of Onset   Hypertension Mother    Prostate  cancer Father    Hypertension Father    Heart Problems Father        quadruple bypass   Hypertension Sister    The patient's mother died at age 57. The patient's father died at age 66 following a stroke. The patient had no brothers, 1 sister. There is no history of breast or ovarian cancer in the family.    GYNECOLOGIC HISTORY:  No LMP recorded. Patient has had a hysterectomy. Menarche age 76, first live birth age 80. The patient is GX P3. She underwent total abdominal hysterectomy, with bilateral salpingo-oophorectomy, in 1993. She used estrogen replacement approximately 9 months. She never used oral contraceptives.   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 Her husband [name] used to r be in businesses but he also is retired. Son Aaron Edelman is a Programme researcher, broadcasting/film/video in [place]. Son Legrand Como is a Engineer, maintenance (IT) in [employer]. Daughter Bronwyn Udoh lives in [place]. 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 The patient has 5 grandchildren. She is a Nurse, learning disability, currently attending [employer]: In place    HEALTH MAINTENANCE: Social History   Tobacco Use   Smoking status: Never   Smokeless tobacco: Never  Vaping Use   Vaping Use: Never used  Substance Use Topics   Alcohol use: No   Drug use: Yes    Types: Other-see comments    Comment: CBD oil     Colonoscopy: 2015/[facility]  PAP:  Bone density: [facility] that showed: T-score of -1.8.    Allergies  Allergen Reactions   Morphine And Related Other (See Comments)    hallucinations    Current Outpatient Medications  Medication Sig Dispense Refill   aspirin EC 81 MG tablet Take 81 mg by mouth daily. Swallow whole.     Cholecalciferol (VITAMIN D) 125 MCG (5000 UT) CAPS Take 5,000 Units by mouth daily.     clonazePAM (KLONOPIN) 1 MG tablet Take 1 mg by mouth 2 (two) times daily.     Cyanocobalamin (B-12) 2500 MCG TABS  Take 2,500 mcg by mouth daily.     cyclobenzaprine (FLEXERIL) 10 MG tablet Take 20 mg by mouth at bedtime.     fluorouracil (EFUDEX) 5 % cream Apply 1 application topically daily as needed (dark spots).     HYDROcodone-acetaminophen (NORCO) 10-325 MG tablet Take 1 tablet by mouth 4 (four) times daily as needed for moderate pain.     metoprolol tartrate (LOPRESSOR) 25 MG tablet Take 25 mg by mouth 2 (two) times daily.  Multiple Vitamins-Minerals (PRESERVISION AREDS 2+MULTI VIT PO) Take 1 capsule by mouth in the morning and at bedtime.     nitrofurantoin (MACRODANTIN) 100 MG capsule Take 1 capsule (100 mg total) by mouth at bedtime.     OVER THE COUNTER MEDICATION Take 1 tablet by mouth daily. Bone up     OVER THE COUNTER MEDICATION Take 2 tablets by mouth daily as needed (constipation). Swiss Kriss     rosuvastatin (CRESTOR) 5 MG tablet Take 5 mg by mouth daily.     sulfamethoxazole-trimethoprim (BACTRIM DS) 800-160 MG tablet Take 1 tablet by mouth 2 (two) times daily. 10 tablet 0   triamterene-hydrochlorothiazide (MAXZIDE-25) 37.5-25 MG tablet Take 1 tablet by mouth daily.     valsartan (DIOVAN) 320 MG tablet Take 320 mg by mouth daily.     venlafaxine XR (EFFEXOR-XR) 75 MG 24 hr capsule Take 1 capsule (75 mg total) by mouth 2 (two) times daily. 180 capsule 4   zolpidem (AMBIEN) 10 MG tablet Take 10 mg by mouth at bedtime.     No current facility-administered medications for this visit.   Facility-Administered Medications Ordered in Other Visits  Medication Dose Route Frequency Provider Last Rate Last Admin   methocarbamol (ROBAXIN) tablet 500 mg  500 mg Oral Q6H PRN Rolm Bookbinder, MD       metroNIDAZOLE (FLAGYL) tablet 500 mg  500 mg Oral Q8H Rolm Bookbinder, MD       ondansetron (ZOFRAN-ODT) disintegrating tablet 4 mg  4 mg Oral Q6H PRN Rolm Bookbinder, MD       Or   ondansetron [facility]) 4 mg in sodium chloride 0.9 % 50 mL IVPB  4 mg Intravenous Q6H PRN Rolm Bookbinder, MD        oxyCODONE (Oxy IR/ROXICODONE) immediate release tablet 5-10 mg  5-10 mg Oral Q4H PRN Rolm Bookbinder, MD       simethicone [facility]) chewable tablet 40 mg  40 mg Oral Q6H PRN Rolm Bookbinder, MD        OBJECTIVE: White woman using a Rollator  There were no vitals filed for this visit.    Wt Readings from Last 3 Encounters:  10/12/22 299 lb 11.2 oz (135.9 kg)  08/29/21 278 lb (126.1 kg)  08/23/21 278 lb (126.1 kg)   There is no height or weight on file to calculate BMI.    ECOG FS:1 - Symptomatic but completely ambulatory  She is in a wheel chair, comfortable and in no acute distress. No lower extremity edema  LAB RESULTS:  CMP     Component Value Date/Time   NA 139 11/09/2022 1054   NA 140 07/04/2017 1112   K 3.5 11/09/2022 1054   K 4.0 07/04/2017 1112   CL 103 11/09/2022 1054   CO2 28 11/09/2022 1054   CO2 28 07/04/2017 1112   GLUCOSE 148 (H) 11/09/2022 1054   GLUCOSE 106 07/04/2017 1112   BUN 17 11/09/2022 1054   BUN 26.7 (H) 07/04/2017 1112   CREATININE 0.86 11/09/2022 1054   CREATININE 0.99 08/14/2021 1120   CREATININE 1.1 07/04/2017 1112   CALCIUM 9.0 11/09/2022 1054   CALCIUM 9.8 07/04/2017 1112   PROT 6.6 11/09/2022 1054   PROT 6.9 07/04/2017 1112   ALBUMIN 3.9 11/09/2022 1054   ALBUMIN 3.8 07/04/2017 1112   AST 21 11/09/2022 1054   AST 20 08/14/2021 1120   AST 21 07/04/2017 1112   ALT 18 11/09/2022 1054   ALT 24 08/14/2021 1120   ALT 29 07/04/2017 1112  ALKPHOS 95 11/09/2022 1054   ALKPHOS 87 07/04/2017 1112   BILITOT 0.4 11/09/2022 1054   BILITOT 0.5 08/14/2021 1120   BILITOT 0.37 07/04/2017 1112   GFRNONAA >60 11/09/2022 1054   GFRNONAA 58 (L) 08/14/2021 1120   GFRAA >60 06/08/2020 0857   GFRAA 53 (L) 02/05/2018 1012    INo results found for: "SPEP", "UPEP"  Lab Results  Component Value Date   WBC 6.4 11/09/2022   NEUTROABS 3.7 11/09/2022   HGB 13.6 11/09/2022   HCT 41.5 11/09/2022   MCV 89.2 11/09/2022   PLT 208  11/09/2022      Chemistry      Component Value Date/Time   NA 139 11/09/2022 1054   NA 140 07/04/2017 1112   K 3.5 11/09/2022 1054   K 4.0 07/04/2017 1112   CL 103 11/09/2022 1054   CO2 28 11/09/2022 1054   CO2 28 07/04/2017 1112   BUN 17 11/09/2022 1054   BUN 26.7 (H) 07/04/2017 1112   CREATININE 0.86 11/09/2022 1054   CREATININE 0.99 08/14/2021 1120   CREATININE 1.1 07/04/2017 1112   GLU 95 01/20/2016 0000      Component Value Date/Time   CALCIUM 9.0 11/09/2022 1054   CALCIUM 9.8 07/04/2017 1112   ALKPHOS 95 11/09/2022 1054   ALKPHOS 87 07/04/2017 1112   AST 21 11/09/2022 1054   AST 20 08/14/2021 1120   AST 21 07/04/2017 1112   ALT 18 11/09/2022 1054   ALT 24 08/14/2021 1120   ALT 29 07/04/2017 1112   BILITOT 0.4 11/09/2022 1054   BILITOT 0.5 08/14/2021 1120   BILITOT 0.37 07/04/2017 1112       No results found for: "LABCA2"  No components found for: "LABCA125"  No results for input(s): "INR" in the last 168 hours.  Urinalysis    Component Value Date/Time   COLORURINE YELLOW 08/29/2016 2120   APPEARANCEUR HAZY (A) 08/29/2016 2120   LABSPEC 1.014 08/29/2016 2120   PHURINE 5.0 08/29/2016 2120   GLUCOSEU NEGATIVE 08/29/2016 2120   HGBUR NEGATIVE 08/29/2016 2120   BILIRUBINUR n 01/25/2020 1101   KETONESUR NEGATIVE 08/29/2016 2120   PROTEINUR Negative 01/25/2020 1101   PROTEINUR NEGATIVE 08/29/2016 2120   UROBILINOGEN negative (A) 01/25/2020 1101   UROBILINOGEN 0.2 05/26/2014 1054   NITRITE n 01/25/2020 1101   NITRITE NEGATIVE 08/29/2016 2120   LEUKOCYTESUR Small (1+) (A) 01/25/2020 1101    STUDIES: US BREAST LTD UNI LEFT INC AXILLA  Result Date: 10/17/2022 CLINICAL DATA:  History of left mastectomy. The patient had a recent area on her chest biopsied with a shave biopsy demonstrating metastatic breast adenocarcinoma. The patient presents for evaluation of the chest wall in the region of biopsy. Patient is scheduled for a PET-CT. By report, the patient  cannot tolerate a breast MRI. EXAM: ULTRASOUND OF THE LEFT BREAST COMPARISON:  None available. FINDINGS: On physical exam, there are multiple small superficial skin base lumps pointed out by the patient near her lumpectomy scar. The largest is identified as a small hypoechoic mass within the skin. The image of this mass was inadvertently not saved but measures 3 or 4 mm. The other smaller skin based lumps could not be visualized with ultrasound. No other abnormalities are identified in the region of ultrasound, deep to the skin based lesions. Targeted ultrasound is performed, showing a 3 or 4 mm mass within the skin at the site of the largest lump, presumably the biopsied lump. The other smaller lumps are not visualized sonographically. No  abnormality is identified deep to the skin. IMPRESSION: Known skin metastases. Only the largest is visualized with ultrasound. No mass identified in the chest wall deep to the skin. RECOMMENDATION: Recommend attention to the left chest wall on the scheduled PET-CT. Recommend continued surgical and oncologic follow up. I have discussed the findings and recommendations with the patient. If applicable, a reminder letter will be sent to the patient regarding the next appointment. BI-RADS CATEGORY  6: Known biopsy-proven malignancy. Electronically Signed   By: Dorise Bullion III M.D.   On: 10/17/2022 15:10  NM PET Image Initial (PI) Skull Base To Thigh  Result Date: 10/11/2022 CLINICAL DATA:  Initial treatment strategy for breast cancer, history of disease recurrence in a patient with history of LEFT breast cancer. Post mastectomy in 2017. Recent skin biopsy reportedly with recurrent disease. EXAM: NUCLEAR MEDICINE PET SKULL BASE TO THIGH TECHNIQUE: 13.85 mCi F-18 FDG was injected intravenously. Full-ring PET imaging was performed from the skull base to thigh after the radiotracer. CT data was obtained and used for attenuation correction and anatomic localization. Fasting blood  glucose: 133 mg/dl COMPARISON:  None available FINDINGS: Mediastinal blood pool activity: SUV max 2.43 Liver activity: SUV max NA NECK: No hypermetabolic lymph nodes in the neck. Incidental CT findings: None. CHEST: Mild skin thickening over the medial LEFT anterior chest following mastectomy. No focal nodular area. Maximum SUV in this area on image 60/4) 1.8. No adenopathy in the chest or suspicious pulmonary nodules. No frank chest wall mass. Incidental CT findings: Aortic atherosclerosis. No signs of aneurysm. No pericardial effusion or nodularity. No adenopathy by size criteria in the chest. Post LEFT mastectomy as discussed ABDOMEN/PELVIS: No abnormal hypermetabolic activity within the liver, pancreas, adrenal glands, or spleen. No hypermetabolic lymph nodes in the abdomen or pelvis. Incidental CT findings: Moderate to marked hepatic steatosis with areas of geographic fatty sparing. Cholelithiasis. Aortic atherosclerosis without aneurysmal dilation. No adenopathy by size criteria in the abdomen or the pelvis. Post hysterectomy. Abdominal wall laxity and fat containing periumbilical hernia. Sacral nerve stimulator in place, power pack over the LEFT gluteal region. SKELETON: No focal hypermetabolic activity to suggest skeletal metastasis. Incidental CT findings: None. IMPRESSION: 1. No signs of FDG avid disease to indicate focal area of recurrence. 2. Mild skin thickening over the medial LEFT anterior chest following mastectomy. No focal nodular area. Only mild FDG uptake in this area. 3. Moderate to marked hepatic steatosis with areas of geographic fatty sparing. 4. Cholelithiasis. 5. Aortic atherosclerosis. 6. Abdominal wall laxity and fat containing periumbilical hernia. Aortic Atherosclerosis (ICD10-I70.0). Electronically Signed   By: Zetta Bills M.D.   On: 10/11/2022 10:13     ELIGIBLE FOR AVAILABLE RESEARCH PROTOCOL: no  ASSESSMENT: 82 y.o. [place] woman status post left breast upper inner  quadrant biopsy 05/04/2016 for a clinical T2 N0 invasive ductal carcinoma, grade 1 estrogen and progesterone receptor positive, HER-2 nonamplified, with an MIB-1 of 10%.  (1) biopsy of a second area 05/09/2016 also in the upper inner quadrant of the left breast showed invasive ductal carcinoma, grade 2, estrogen and progesterone receptor positive, HER-2 nonamplified, with an MIB-1 of 15%.  (2) status post left lumpectomy and sentinel lymph node sampling 06/13/2016 for an mpT1c pN0(i+), stage IA invasive ductal carcinoma, grade 1, with close but negative margins  (a) status post bilateral reduction mammoplasty with left oncoplastic surgery 06/25/2016  (b) status post debridement of left nipple/areolar necrosis 07/11/2016  (c) status post left simple mastectomy 08/02/2016 showing inflammation and abscess but  no evidence of malignancy  (3) Oncotype score of 18 predicts a 10 year risk of recurrence outside the breast of 11% if the patient's only systemic therapy is tamoxifen for 5 years. It also predicts no significant benefit from chemotherapy  (4) postmastectomy radiation not indicated  (5) started anastrozole September 2017, completing five years November 2022  (a) DEXA scan 04/17/2017 showed a T score of -1.8   (B DEXA scan 01/26/2020) showed a T score of -2.2.  (6) since last visit she had a dermatology appointment on December 12 and had some lesions noted which were shave biopsied from the left chest wall. Pathology from that showed dermal deposits of metastatic breast adenocarcinoma extending to the edge and base, ER +95% 3+ PR 80% 3+ HER2 2+ by IHC.  Her 2 FISH negative.  PLAN:  We have reviewed her recent pathology report from the shave biopsy which showed ER/PR positive invasive adenocarcinoma of the breast, HER2 equivocal by IHC, FISH negative.   PET scan with no metastatic disease.  She will continue on faslodex in the interim. I didn't consider Tamoxifen given her sedentary status  but this is certainly an option in the future. Dr Donne Hazel recommended [place] to assess the local extent of disease.  She had ultrasound which showed known skin metastasis only the largest was visualized on the ultrasound.  No mass identified in the chest wall deep to the skin.  She will follow-up with Dr. Donne Hazel for additional surgical recommendations. In the interim she will continue faslodex every 28 days and RTC in 3 months. Total time spent: 30 minutes  *Total Encounter Time as defined by the Centers for Medicare and Medicaid Services includes, in addition to the face-to-face time of a patient visit (documented in the note above) non-face-to-face time: obtaining and reviewing outside history, ordering and reviewing medications, tests or procedures, care coordination (communications with other health care professionals or caregivers) and documentation in the medical record.

## 2022-11-13 ENCOUNTER — Other Ambulatory Visit: Payer: Self-pay | Admitting: General Surgery

## 2022-11-13 DIAGNOSIS — R222 Localized swelling, mass and lump, trunk: Secondary | ICD-10-CM

## 2022-11-21 NOTE — Pre-Procedure Instructions (Signed)
Surgical Instructions    Your procedure is scheduled on Monday, November 26, 2022.  Report to [facility] Main Entrance "A" at 5:30 A.M., then check in with the Admitting office.  Call this number if you have problems the morning of surgery:  [phone]   If you have any questions prior to your surgery date call [phone]: Open Monday-Friday 8am-4pm If you experience any cold or flu symptoms such as cough, fever, chills, shortness of breath, etc. between now and your scheduled surgery, please notify us at the above number     Remember:  Do not eat after midnight the night before your surgery  You may drink clear liquids until 4:30 am  the morning of your surgery.   Clear liquids allowed are: Water, Non-Citrus Juices (without pulp), Carbonated Beverages, Clear Tea, Black Coffee ONLY (NO MILK, CREAM OR POWDERED CREAMER of any kind), and Gatorade    Take these medicines the morning of surgery with A SIP OF WATER:   metoprolol tartrate (LOPRESSOR)   rosuvastatin (CRESTOR)   venlafaxine XR (EFFEXOR-XR)   sulfamethoxazole-trimethoprim (BACTRIM DS)   clonazePAM (KLONOPIN)   HYDROcodone-acetaminophen (NORCO) -if needed   As of today, STOP taking Aleve, Naproxen, Ibuprofen, Motrin, Advil, Goody's, BC's, all herbal medications, fish oil, and all vitamins.  Follow your surgeon's instructions on when to stop Aspirin.  If no instructions were given by your surgeon then you will need to call the office to get those instructions.              Do not wear jewelry or makeup. Do not wear lotions, powders, perfumes or deodorant. Do not shave 48 hours prior to surgery.   Do not bring valuables to the hospital. Do not wear nail polish, gel polish, artificial nails, or any other type of covering on natural nails (fingers and toes) If you have artificial nails or gel coating that need to be removed by a nail salon, please have this removed prior to surgery. Artificial nails or gel coating may  interfere with anesthesia's ability to adequately monitor your vital signs.  [facility] is not responsible for any belongings or valuables.    Do NOT Smoke (Tobacco/Vaping)  24 hours prior to your procedure  If you use a CPAP at night, you may bring your mask for your overnight stay.   Contacts, glasses, hearing aids, dentures or partials may not be worn into surgery, please bring cases for these belongings   For patients admitted to the hospital, discharge time will be determined by your treatment team.   Patients discharged the day of surgery will not be allowed to drive home, and someone needs to stay with them for 24 hours.   SURGICAL WAITING ROOM VISITATION Patients having surgery or a procedure may have no more than 2 support people in the waiting area - these visitors may rotate.   Children under the age of 34 must have an adult with them who is not the patient. If the patient needs to stay at the hospital during part of their recovery, the visitor guidelines for inpatient rooms apply. Pre-op nurse will coordinate an appropriate time for 1 support person to accompany patient in pre-op.  This support person may not rotate.   Please refer to [email or web address] for the visitor guidelines for Inpatients (after your surgery is over and you are in a regular room).    Special instructions:    Oral Hygiene is also important to reduce your risk of infection.  Remember - BRUSH  YOUR TEETH THE MORNING OF SURGERY WITH YOUR REGULAR TOOTHPASTE   [place]- Preparing For Surgery  Before surgery, you can play an important role. Because skin is not sterile, your skin needs to be as free of germs as possible. You can reduce the number of germs on your skin by washing with CHG (chlorahexidine gluconate) Soap before surgery.  CHG is an antiseptic cleaner which kills germs and bonds with the skin to continue killing germs even after washing.      Please do not use if you have an allergy to CHG or antibacterial soaps. If your skin becomes reddened/irritated stop using the CHG.  Do not shave (including legs and underarms) for at least 48 hours prior to first CHG shower. It is OK to shave your face.  Please follow these instructions carefully.     Shower the NIGHT BEFORE SURGERY and the MORNING OF SURGERY with CHG Soap.   If you chose to wash your hair, wash your hair first as usual with your normal shampoo. After you shampoo, rinse your hair and body thoroughly to remove the shampoo.  Then [employer] and genitals (private parts) with your normal soap and rinse thoroughly to remove soap.  After that Use CHG Soap as you would any other liquid soap. You can apply CHG directly to the skin and wash gently with a scrungie or a clean washcloth.   Apply the CHG Soap to your body ONLY FROM THE NECK DOWN.  Do not use on open wounds or open sores. Avoid contact with your eyes, ears, mouth and genitals (private parts). Wash Face and genitals (private parts)  with your normal soap.   Wash thoroughly, paying special attention to the area where your surgery will be performed.  Thoroughly rinse your body with warm water from the neck down.  DO NOT shower/wash with your normal soap after using and rinsing off the CHG Soap.  Pat yourself dry with a CLEAN TOWEL.  Wear CLEAN PAJAMAS to bed the night before surgery  Place CLEAN SHEETS on your bed the night before your surgery  DO NOT SLEEP WITH PETS.   Day of Surgery:  Take a shower with CHG soap. Wear Clean/Comfortable clothing the morning of surgery Do not apply any deodorants/lotions.   Remember to brush your teeth WITH YOUR REGULAR TOOTHPASTE.    If you received a COVID test during your pre-op visit, it is requested that you wear a mask when out in public, stay away from anyone that may not be feeling well, and notify your surgeon if you develop symptoms. If you have been in contact  with anyone that has tested positive in the last 10 days, please notify your surgeon.    Please read over the following fact sheets that you were given.

## 2022-11-22 ENCOUNTER — Other Ambulatory Visit: Payer: Self-pay

## 2022-11-22 ENCOUNTER — Encounter (HOSPITAL_COMMUNITY): Payer: Self-pay

## 2022-11-22 ENCOUNTER — Inpatient Hospital Stay: Payer: Medicare Other | Attending: Hematology and Oncology

## 2022-11-22 ENCOUNTER — Encounter (HOSPITAL_COMMUNITY)
Admission: RE | Admit: 2022-11-22 | Discharge: 2022-11-22 | Disposition: A | Discharge status: Home / Self Care | Payer: Medicare Other | Source: Ambulatory Visit | Attending: General Surgery | Admitting: General Surgery

## 2022-11-22 VITALS — BP 143/73 | HR 96 | Temp 98.1°F | Resp 18 | Ht 61.0 in | Wt 255.0 lb

## 2022-11-22 DIAGNOSIS — Z170 Estrogen receptor positive status [ER+]: Secondary | ICD-10-CM | POA: Insufficient documentation

## 2022-11-22 DIAGNOSIS — Z79818 Long term (current) use of other agents affecting estrogen receptors and estrogen levels: Secondary | ICD-10-CM | POA: Diagnosis not present

## 2022-11-22 DIAGNOSIS — Z853 Personal history of malignant neoplasm of breast: Secondary | ICD-10-CM | POA: Diagnosis not present

## 2022-11-22 DIAGNOSIS — C50212 Malignant neoplasm of upper-inner quadrant of left female breast: Secondary | ICD-10-CM | POA: Insufficient documentation

## 2022-11-22 DIAGNOSIS — Z7960 Long term (current) use of unspecified immunomodulators and immunosuppressants: Secondary | ICD-10-CM | POA: Diagnosis not present

## 2022-11-22 DIAGNOSIS — I4510 Unspecified right bundle-branch block: Secondary | ICD-10-CM | POA: Insufficient documentation

## 2022-11-22 DIAGNOSIS — Z01818 Encounter for other preprocedural examination: Secondary | ICD-10-CM

## 2022-11-22 DIAGNOSIS — Z9012 Acquired absence of left breast and nipple: Secondary | ICD-10-CM | POA: Insufficient documentation

## 2022-11-22 LAB — SURGICAL PCR SCREEN
MRSA, PCR: NEGATIVE
Staphylococcus aureus: NEGATIVE

## 2022-11-22 MED ORDER — FULVESTRANT 250 MG/5ML IM SOSY
500.0000 mg | PREFILLED_SYRINGE | Freq: Once | INTRAMUSCULAR | Status: AC
  Administered 2022-11-22: 500 mg via INTRAMUSCULAR
  Filled 2022-11-22: qty 10

## 2022-11-22 NOTE — Patient Instructions (Signed)

## 2022-11-22 NOTE — Progress Notes (Signed)
PCP - Dr.Neil Schwartzman Cardiologist - patient denies -per epic Dr.Mark Skains (last office visit 2017)  PPM/ICD - pt denies Device Orders - n/a Rep Notified - n/a  Chest x-ray - n/a EKG - 11/22/22 Stress Test - maybe year 2000, pt unsure. Normal results per pt. ECHO - 08/30/16 Cardiac Cath - pt denies  Sleep Study - pt denies CPAP - n/a  Patient denies Diabetes.  Last dose of GLP1 agonist-  pt denies GLP1 instructions: n/a  Blood Thinner Instructions:pt denies Aspirin Instructions:Follow your surgeon's instructions on when to stop Aspirin.  If no instructions were given by your surgeon then you will need to call the office to get those instructions.     ERAS Protcol -yes PRE-SURGERY Ensure or G2- none ordered   COVID TEST- n/a   Anesthesia review: YES, cardiac history. Abnormal EKG.High BMI.  Patient denies shortness of breath, fever, cough and chest pain at PAT appointment. Patient's husband present during PAT visit today.   All instructions explained to the patient, with a verbal understanding of the material. Patient agrees to go over the instructions while at home for a better understanding. Patient also instructed to self quarantine after being tested for COVID-19. The opportunity to ask questions was provided.

## 2022-11-23 ENCOUNTER — Ambulatory Visit
Admission: RE | Admit: 2022-11-23 | Discharge: 2022-11-23 | Disposition: A | Discharge status: Home / Self Care | Payer: Medicare Other | Source: Ambulatory Visit | Attending: General Surgery | Admitting: General Surgery

## 2022-11-23 ENCOUNTER — Inpatient Hospital Stay: Payer: Medicare Other

## 2022-11-23 DIAGNOSIS — N6489 Other specified disorders of breast: Secondary | ICD-10-CM | POA: Diagnosis not present

## 2022-11-23 DIAGNOSIS — R222 Localized swelling, mass and lump, trunk: Secondary | ICD-10-CM

## 2022-11-23 NOTE — Anesthesia Preprocedure Evaluation (Addendum)
Anesthesia Evaluation  Patient identified by MRN, date of birth, ID band Patient awake    Reviewed: Allergy & Precautions, NPO status , Patient's Chart, lab work & pertinent test results, reviewed documented beta blocker date and time   History of Anesthesia Complications (+) PONV and history of anesthetic complications  Airway Mallampati: III  TM Distance: >3 FB Neck ROM: Full    Dental no notable dental hx.    Pulmonary neg pulmonary ROS   Pulmonary exam normal        Cardiovascular hypertension, Pt. on medications and Pt. on home beta blockers Normal cardiovascular exam+ dysrhythmias Atrial Fibrillation      Neuro/Psych   Anxiety Depression    negative neurological ROS     GI/Hepatic Neg liver ROS,GERD  ,,  Endo/Other    Morbid obesity  Renal/GU Renal disease  negative genitourinary   Musculoskeletal  (+) Arthritis , Rheumatoid disorders,    Abdominal   Peds  Hematology negative hematology ROS (+)   Anesthesia Other Findings Left breast ca  Reproductive/Obstetrics negative OB ROS                              Anesthesia Physical Anesthesia Plan  ASA: 3  Anesthesia Plan: General   Post-op Pain Management: Tylenol PO (pre-op)*   Induction: Intravenous  PONV Risk Score and Plan: 4 or greater and Treatment may vary due to age or medical condition, Ondansetron, Dexamethasone, Propofol infusion and TIVA  Airway Management Planned: Oral ETT and Video Laryngoscope Planned  Additional Equipment: None  Intra-op Plan:   Post-operative Plan: Extubation in OR  Informed Consent: I have reviewed the patients History and Physical, chart, labs and discussed the procedure including the risks, benefits and alternatives for the proposed anesthesia with the patient or authorized representative who has indicated his/her understanding and acceptance.     Dental advisory given  Plan  Discussed with: CRNA  Anesthesia Plan Comments: (PAT note by Antionette Poles, PA-C: 82 year old female with history of left breast cancer s/p lumpectomy complicated by necrosis with full mastectomy performed on 08/02/2016 and continued open wound following surgery.  She was admitted in December 2017 for infected left mastectomy wound site and was found to be in A-fib at that time.  A-fib was felt likely related to acute infection.Echo showed moderate concentric hypertrophy, EF 55 to 60%, normal wall motion, normal valves.  In November 2022 she was discharged by oncology after completing 5 years of antiestrogen therapy.  However, she recently was seen by her dermatologist and had a shave biopsy of the skin on the left chest wall that resulted as metastatic breast cancer.  PET scan did not show any evidence of metastatic disease.  She was seen by oncology and started on Faslodex.  CMP and CBC from 11/09/2022 reviewed, WNL.  EKG 11/22/2022: Normal sinus rhythm.  Rate 92. Left axis deviation. Right bundle branch block. Possible Lateral infarct , age undetermined.  No significant change since last tracing.  TTE 08/30/2016: - Left ventricle: The cavity size was normal. There was moderate    concentric hypertrophy. Systolic function was normal. The    estimated ejection fraction was in the range of 55% to 60%. Wall    motion was normal; there were no regional wall motion    abnormalities.   )         Anesthesia Quick Evaluation

## 2022-11-23 NOTE — Progress Notes (Signed)
Anesthesia Chart Review:  82 year old female with history of left breast cancer s/p lumpectomy complicated by necrosis with full mastectomy performed on 08/02/2016 and continued open wound following surgery.  She was admitted in December 2017 for infected left mastectomy wound site and was found to be in A-fib at that time.  A-fib was felt likely related to acute infection.Echo showed moderate concentric hypertrophy, EF 55 to 60%, normal wall motion, normal valves.  In November 2022 she was discharged by oncology after completing 5 years of antiestrogen therapy.  However, she recently was seen by her dermatologist and had a shave biopsy of the skin on the left chest wall that resulted as metastatic breast cancer.  PET scan did not show any evidence of metastatic disease.  She was seen by oncology and started on Faslodex.  CMP and CBC from 11/09/2022 reviewed, WNL.  EKG 11/22/2022: Normal sinus rhythm.  Rate 92. Left axis deviation. Right bundle branch block. Possible Lateral infarct , age undetermined.  No significant change since last tracing.  TTE 08/30/2016: - Left ventricle: The cavity size was normal. There was moderate    concentric hypertrophy. Systolic function was normal. The    estimated ejection fraction was in the range of 55% to 60%. Wall    motion was normal; there were no regional wall motion    abnormalities.     Wynonia Musty [facility] Short Stay Center/Anesthesiology Phone [phone] 11/23/2022 11:00 AM

## 2022-11-26 ENCOUNTER — Ambulatory Visit (HOSPITAL_COMMUNITY)
Admission: RE | Admit: 2022-11-26 | Discharge: 2022-11-26 | Disposition: A | Discharge status: Home / Self Care | Payer: Medicare Other | Attending: General Surgery | Admitting: General Surgery

## 2022-11-26 ENCOUNTER — Other Ambulatory Visit: Payer: Self-pay

## 2022-11-26 ENCOUNTER — Encounter (HOSPITAL_COMMUNITY): Payer: Self-pay | Admitting: General Surgery

## 2022-11-26 ENCOUNTER — Encounter (HOSPITAL_COMMUNITY)
Admission: RE | Disposition: A | Discharge status: Home / Self Care | Payer: Self-pay | Source: Home / Self Care | Attending: General Surgery

## 2022-11-26 ENCOUNTER — Ambulatory Visit (HOSPITAL_BASED_OUTPATIENT_CLINIC_OR_DEPARTMENT_OTHER): Payer: Medicare Other | Admitting: Anesthesiology

## 2022-11-26 ENCOUNTER — Ambulatory Visit (HOSPITAL_COMMUNITY): Payer: Medicare Other | Admitting: Physician Assistant

## 2022-11-26 DIAGNOSIS — C44501 Unspecified malignant neoplasm of skin of breast: Secondary | ICD-10-CM | POA: Diagnosis not present

## 2022-11-26 DIAGNOSIS — C50912 Malignant neoplasm of unspecified site of left female breast: Secondary | ICD-10-CM

## 2022-11-26 DIAGNOSIS — I10 Essential (primary) hypertension: Secondary | ICD-10-CM | POA: Insufficient documentation

## 2022-11-26 DIAGNOSIS — Z170 Estrogen receptor positive status [ER+]: Secondary | ICD-10-CM | POA: Diagnosis not present

## 2022-11-26 DIAGNOSIS — F418 Other specified anxiety disorders: Secondary | ICD-10-CM

## 2022-11-26 DIAGNOSIS — C792 Secondary malignant neoplasm of skin: Secondary | ICD-10-CM | POA: Diagnosis not present

## 2022-11-26 DIAGNOSIS — Z9012 Acquired absence of left breast and nipple: Secondary | ICD-10-CM | POA: Diagnosis not present

## 2022-11-26 DIAGNOSIS — Z6841 Body Mass Index (BMI) 40.0 and over, adult: Secondary | ICD-10-CM | POA: Insufficient documentation

## 2022-11-26 DIAGNOSIS — I4891 Unspecified atrial fibrillation: Secondary | ICD-10-CM | POA: Insufficient documentation

## 2022-11-26 DIAGNOSIS — L905 Scar conditions and fibrosis of skin: Secondary | ICD-10-CM | POA: Diagnosis not present

## 2022-11-26 HISTORY — PX: BREAST CYST EXCISION: SHX579

## 2022-11-26 SURGERY — EXCISION, CYST, BREAST
Anesthesia: General | Site: Breast | Laterality: Left

## 2022-11-26 MED ORDER — SODIUM CHLORIDE 0.9 % IV SOLN: INTRAVENOUS | Status: DC

## 2022-11-26 MED ORDER — BUPIVACAINE-EPINEPHRINE (PF) 0.25% -1:200000 IJ SOLN
INTRAMUSCULAR | Status: AC
  Filled 2022-11-26: qty 30

## 2022-11-26 MED ORDER — ORAL CARE MOUTH RINSE: 15.0000 mL | Freq: Once | OROMUCOSAL | Status: AC

## 2022-11-26 MED ORDER — CHLORHEXIDINE GLUCONATE CLOTH 2 % EX PADS: 6.0000 | MEDICATED_PAD | Freq: Once | CUTANEOUS | Status: DC

## 2022-11-26 MED ORDER — SODIUM CHLORIDE 0.9% FLUSH: 3.0000 mL | Freq: Two times a day (BID) | INTRAVENOUS | Status: DC

## 2022-11-26 MED ORDER — CEFAZOLIN SODIUM-DEXTROSE 2-4 GM/100ML-% IV SOLN
2.0000 g | INTRAVENOUS | Status: AC
  Administered 2022-11-26: 2 g via INTRAVENOUS
  Filled 2022-11-26: qty 100

## 2022-11-26 MED ORDER — OXYCODONE HCL 5 MG PO TABS: 5.0000 mg | ORAL_TABLET | Freq: Once | ORAL | Status: DC | PRN

## 2022-11-26 MED ORDER — FENTANYL CITRATE (PF) 250 MCG/5ML IJ SOLN
INTRAMUSCULAR | Status: AC
  Filled 2022-11-26: qty 5

## 2022-11-26 MED ORDER — PROPOFOL 10 MG/ML IV BOLUS
INTRAVENOUS | Status: AC
  Filled 2022-11-26: qty 20

## 2022-11-26 MED ORDER — TRAMADOL HCL 50 MG PO TABS: 50.0000 mg | ORAL_TABLET | Freq: Four times a day (QID) | ORAL | Status: DC | PRN

## 2022-11-26 MED ORDER — ACETAMINOPHEN 500 MG PO TABS: 1000.0000 mg | ORAL_TABLET | ORAL | Status: DC

## 2022-11-26 MED ORDER — SUCCINYLCHOLINE CHLORIDE 200 MG/10ML IV SOSY
PREFILLED_SYRINGE | INTRAVENOUS | Status: DC | PRN
  Administered 2022-11-26: 140 mg via INTRAVENOUS

## 2022-11-26 MED ORDER — LIDOCAINE 2% (20 MG/ML) 5 ML SYRINGE
INTRAMUSCULAR | Status: AC
  Filled 2022-11-26: qty 5

## 2022-11-26 MED ORDER — PROPOFOL 10 MG/ML IV BOLUS
INTRAVENOUS | Status: DC | PRN
  Administered 2022-11-26: 150 mg via INTRAVENOUS

## 2022-11-26 MED ORDER — ACETAMINOPHEN 650 MG RE SUPP: 650.0000 mg | RECTAL | Status: DC | PRN

## 2022-11-26 MED ORDER — AMISULPRIDE (ANTIEMETIC) 5 MG/2ML IV SOLN: 10.0000 mg | Freq: Once | INTRAVENOUS | Status: DC | PRN

## 2022-11-26 MED ORDER — ACETAMINOPHEN 325 MG PO TABS
ORAL_TABLET | ORAL | Status: AC
  Administered 2022-11-26: 650 mg via ORAL
  Filled 2022-11-26: qty 2

## 2022-11-26 MED ORDER — LACTATED RINGERS IV SOLN: INTRAVENOUS | Status: DC

## 2022-11-26 MED ORDER — SODIUM CHLORIDE 0.9% FLUSH: 3.0000 mL | INTRAVENOUS | Status: DC | PRN

## 2022-11-26 MED ORDER — PHENYLEPHRINE HCL-NACL 20-0.9 MG/250ML-% IV SOLN
INTRAVENOUS | Status: DC | PRN
  Administered 2022-11-26: 50 ug/min via INTRAVENOUS

## 2022-11-26 MED ORDER — OXYCODONE HCL 5 MG/5ML PO SOLN: 5.0000 mg | Freq: Once | ORAL | Status: DC | PRN

## 2022-11-26 MED ORDER — BUPIVACAINE-EPINEPHRINE 0.25% -1:200000 IJ SOLN
INTRAMUSCULAR | Status: DC | PRN
  Administered 2022-11-26: 10 mL

## 2022-11-26 MED ORDER — 0.9 % SODIUM CHLORIDE (POUR BTL) OPTIME
TOPICAL | Status: DC | PRN
  Administered 2022-11-26: 1000 mL

## 2022-11-26 MED ORDER — SODIUM CHLORIDE 0.9 % IV SOLN: 250.0000 mL | INTRAVENOUS | Status: DC | PRN

## 2022-11-26 MED ORDER — ACETAMINOPHEN 325 MG PO TABS: 650.0000 mg | ORAL_TABLET | ORAL | Status: DC | PRN

## 2022-11-26 MED ORDER — ONDANSETRON HCL 4 MG/2ML IJ SOLN
INTRAMUSCULAR | Status: DC | PRN
  Administered 2022-11-26: 4 mg via INTRAVENOUS

## 2022-11-26 MED ORDER — CHLORHEXIDINE GLUCONATE 0.12 % MT SOLN
15.0000 mL | Freq: Once | OROMUCOSAL | Status: AC
  Administered 2022-11-26: 15 mL via OROMUCOSAL
  Filled 2022-11-26: qty 15

## 2022-11-26 MED ORDER — DEXAMETHASONE SODIUM PHOSPHATE 10 MG/ML IJ SOLN
INTRAMUSCULAR | Status: AC
  Filled 2022-11-26: qty 1

## 2022-11-26 MED ORDER — SUCCINYLCHOLINE CHLORIDE 200 MG/10ML IV SOSY
PREFILLED_SYRINGE | INTRAVENOUS | Status: AC
  Filled 2022-11-26: qty 10

## 2022-11-26 MED ORDER — FENTANYL CITRATE (PF) 100 MCG/2ML IJ SOLN: 25.0000 ug | INTRAMUSCULAR | Status: DC | PRN

## 2022-11-26 MED ORDER — ONDANSETRON HCL 4 MG/2ML IJ SOLN
INTRAMUSCULAR | Status: AC
  Filled 2022-11-26: qty 2

## 2022-11-26 MED ORDER — ACETAMINOPHEN 500 MG PO TABS
1000.0000 mg | ORAL_TABLET | Freq: Once | ORAL | Status: AC
  Filled 2022-11-26: qty 2

## 2022-11-26 MED ORDER — DEXAMETHASONE SODIUM PHOSPHATE 10 MG/ML IJ SOLN
INTRAMUSCULAR | Status: DC | PRN
  Administered 2022-11-26: 4 mg via INTRAVENOUS

## 2022-11-26 MED ORDER — FENTANYL CITRATE (PF) 250 MCG/5ML IJ SOLN
INTRAMUSCULAR | Status: DC | PRN
  Administered 2022-11-26 (×2): 50 ug via INTRAVENOUS

## 2022-11-26 MED ORDER — LIDOCAINE 2% (20 MG/ML) 5 ML SYRINGE
INTRAMUSCULAR | Status: DC | PRN
  Administered 2022-11-26: 100 mg via INTRAVENOUS

## 2022-11-26 SURGICAL SUPPLY — 44 items
ADH SKN CLS APL DERMABOND .7 (GAUZE/BANDAGES/DRESSINGS) ×1
APL PRP STRL LF DISP 70% ISPRP (MISCELLANEOUS) ×1
APPLIER CLIP 9.375 MED OPEN (MISCELLANEOUS)
APR CLP MED 9.3 20 MLT OPN (MISCELLANEOUS)
BAG COUNTER SPONGE SURGICOUNT (BAG) ×1 IMPLANT
BAG SPNG CNTER NS LX DISP (BAG) ×1
BINDER BREAST LRG (GAUZE/BANDAGES/DRESSINGS) IMPLANT
BINDER BREAST XLRG (GAUZE/BANDAGES/DRESSINGS) IMPLANT
CANISTER SUCT 3000ML PPV (MISCELLANEOUS) ×1 IMPLANT
CHLORAPREP W/TINT 26 (MISCELLANEOUS) ×1 IMPLANT
CLIP APPLIE 9.375 MED OPEN (MISCELLANEOUS) IMPLANT
COVER SURGICAL LIGHT HANDLE (MISCELLANEOUS) ×1 IMPLANT
DERMABOND ADVANCED .7 DNX12 (GAUZE/BANDAGES/DRESSINGS) ×1 IMPLANT
DRAPE CHEST BREAST 15X10 FENES (DRAPES) ×1 IMPLANT
ELECT CAUTERY BLADE 6.4 (BLADE) ×1 IMPLANT
ELECT REM PT RETURN 9FT ADLT (ELECTROSURGICAL) ×1
ELECTRODE REM PT RTRN 9FT ADLT (ELECTROSURGICAL) ×1 IMPLANT
GLOVE BIO SURGEON STRL SZ7 (GLOVE) ×1 IMPLANT
GLOVE BIOGEL PI IND STRL 7.5 (GLOVE) ×1 IMPLANT
GOWN STRL REUS W/ TWL LRG LVL3 (GOWN DISPOSABLE) ×2 IMPLANT
GOWN STRL REUS W/TWL LRG LVL3 (GOWN DISPOSABLE) ×2
KIT BASIN OR (CUSTOM PROCEDURE TRAY) ×1 IMPLANT
KIT TURNOVER KIT B (KITS) ×1 IMPLANT
NDL HYPO 25GX1X1/2 BEV (NEEDLE) ×1 IMPLANT
NEEDLE HYPO 25GX1X1/2 BEV (NEEDLE) ×1 IMPLANT
NS IRRIG 1000ML POUR BTL (IV SOLUTION) ×1 IMPLANT
PACK GENERAL/GYN (CUSTOM PROCEDURE TRAY) ×1 IMPLANT
PAD ARMBOARD 7.5X6 YLW CONV (MISCELLANEOUS) ×1 IMPLANT
PENCIL SMOKE EVACUATOR (MISCELLANEOUS) IMPLANT
PUNCH BIOPSY DERMAL 6MM STRL (MISCELLANEOUS) IMPLANT
SPIKE FLUID TRANSFER (MISCELLANEOUS) ×1 IMPLANT
STRIP CLOSURE SKIN 1/2X4 (GAUZE/BANDAGES/DRESSINGS) IMPLANT
SUT ETHILON 3 0 PS 1 (SUTURE) IMPLANT
SUT MNCRL AB 4-0 PS2 18 (SUTURE) IMPLANT
SUT MON AB 5-0 PS2 18 (SUTURE) IMPLANT
SUT SILK 2 0 SH (SUTURE) IMPLANT
SUT VIC AB 2-0 SH 27 (SUTURE) ×1
SUT VIC AB 2-0 SH 27XBRD (SUTURE) ×1 IMPLANT
SUT VIC AB 3-0 SH 27 (SUTURE) ×1
SUT VIC AB 3-0 SH 27X BRD (SUTURE) ×1 IMPLANT
SUT VIC AB 3-0 SH 8-18 (SUTURE) IMPLANT
SYR CONTROL 10ML LL (SYRINGE) ×1 IMPLANT
TOWEL GREEN STERILE (TOWEL DISPOSABLE) ×1 IMPLANT
TOWEL GREEN STERILE FF (TOWEL DISPOSABLE) ×1 IMPLANT

## 2022-11-26 NOTE — Discharge Instructions (Signed)
[facility] [place] [facility], [place] [phone]  POST OP INSTRUCTIONS Take 400 mg of ibuprofen every 8 hours or 650 mg tylenol every 6 hours for next 72 hours then as needed. Use ice several times daily also.  Take your usually prescribed medications unless otherwise directed. You should follow a light diet the first 24 hours after surgery.  Resume your normal diet the day after surgery. Most patients will experience some swelling and bruising on the chest .  Ice packs will help.  Swelling and bruising can take several days to resolve.  It is common to experience some constipation if taking pain medication after surgery.  Increasing fluid intake and taking a stool softener (such as Colace) will usually help or prevent this problem from occurring.  A mild laxative (Milk of Magnesia or Miralax) should be taken according to package instructions if there are no bowel movements after 48 hours. There is glue and steristrips on your incision. They will come off in the next few weeks.  You may take a shower 48 hours after surgery.  Any sutures will be removed at an office visit ACTIVITIES:  You may resume regular (light) daily activities beginning the next day--such as daily self-care, walking, climbing stairs--gradually increasing activities as tolerated.  Refrain from any heavy lifting or straining until approved by your doctor. You may drive when you are no longer taking prescription pain medication, you can comfortably wear a seatbelt, and you can safely maneuver your car and apply brakes. RETURN TO WORK:  __________________________________________________________ [name] should see your doctor in the office for a follow-up appointment approximately 2 weeks after your surgery.  Your doctor's nurse will typically make your follow-up appointment when she calls you with your pathology report.  Expect your pathology report 3-4 business days after surgery. OTHER INSTRUCTIONS:  ______________________________________________________________________________________________ ____________________________________________________________________________________________ WHEN TO CALL YOUR DR [name]: Fever over 101.0 Nausea and/or vomiting Extreme swelling or bruising Continued bleeding from incision. Increased pain, redness, or drainage from the incision. The clinic staff is available to answer your questions during regular business hours.  Please don't hesitate to call and ask to speak to one of the nurses for clinical concerns.  If you have a medical emergency, go to the nearest emergency room or call 911.  A surgeon from [facility] is always on call at the hospital. [address] [place], [place], [place]  [address] ? [address], [place], [place]   [phone] ? [phone] ? FAX [phone] Web site: [email or web address]

## 2022-11-26 NOTE — Anesthesia Postprocedure Evaluation (Signed)
Anesthesia Post Note  Patient: RAEGENE GOLDMANN  Procedure(s) Performed: LEFT CHEST WALL MASS EXCISION (Left: Breast)     Patient location during evaluation: PACU Anesthesia Type: General Level of consciousness: awake and alert Pain management: pain level controlled Vital Signs Assessment: post-procedure vital signs reviewed and stable Respiratory status: spontaneous breathing, nonlabored ventilation and respiratory function stable Cardiovascular status: blood pressure returned to baseline Postop Assessment: no apparent nausea or vomiting Anesthetic complications: yes   Encounter Notable Events  Notable Event Outcome Phase Comment  Difficult to intubate - expected  Intraprocedure Filed from anesthesia note documentation.    Last Vitals:  Vitals:   11/26/22 0905 11/26/22 0915  BP: (!) 167/89 (!) 153/84  Pulse: 72 72  Resp: 18 17  Temp:    SpO2: 95% 90%    Last Pain:  Vitals:   11/26/22 0915  TempSrc:   PainSc: 0-No pain                 Marthenia Rolling

## 2022-11-26 NOTE — Anesthesia Procedure Notes (Signed)
Procedure Name: Intubation Date/Time: 11/26/2022 7:38 AM  Performed by: Renato Shin, CRNAPre-anesthesia Checklist: Patient identified, Emergency Drugs available, Suction available and Patient being monitored Patient Re-evaluated:Patient Re-evaluated prior to induction Oxygen Delivery Method: Circle system utilized Preoxygenation: Pre-oxygenation with 100% oxygen Induction Type: IV induction Ventilation: Mask ventilation without difficulty Laryngoscope Size: 3 and Glidescope Grade View: Grade I Tube type: Oral Tube size: 7.0 mm Number of attempts: 1 Airway Equipment and Method: Stylet and Oral airway Placement Confirmation: ETT inserted through vocal cords under direct vision, positive ETCO2 and breath sounds checked- equal and bilateral Secured at: 21 cm Tube secured with: Tape Dental Injury: Teeth and Oropharynx as per pre-operative assessment  Difficulty Due To: Difficulty was anticipated Future Recommendations: Recommend- induction with short-acting agent, and alternative techniques readily available

## 2022-11-26 NOTE — H&P (View-Only) (Signed)
82-year-old female who I know well from prior surgery. She underwent an initial lumpectomy as well as sentinel lymph node biopsy in 2017. This was followed by oncoplastic breast reconstruction and the left side had significant fat necrosis and wound issues so she we then proceeded and did a left mastectomy due to the nonhealing wound and fat necrosis. Her initial pathology was an invasive ductal carcinoma with 1 negative sentinel lymph node and ITC's and an additional lymph node. This tumor was 90% ER and PR positive, HER2 negative, with a low Ki-67. She was then treated with anastrozole for 5 years. She has been doing okay. She had an area on her chest that she was concerned about she underwent a shave biopsy at her dermatologist. This shows this to be metastatic breast adenocarcinoma extending to the edge and the base of the shave biopsy. This is greater than 95% ER positive, 80% PR positive, HER2 is negative. She is mostly confined to a scooter and is not able we will really ambulate at this point. She is here with her husband and her daughter today. Pet is negative.  Review of Systems: A complete review of systems was obtained from the patient. I have reviewed this information and discussed as appropriate with the patient. See HPI as well for other ROS.  Review of Systems  Eyes: Positive for blurred vision.  Respiratory: Positive for shortness of breath.  Cardiovascular: Positive for leg swelling.  Gastrointestinal: Positive for constipation.  Musculoskeletal: Positive for neck pain.  Neurological: Positive for weakness.  Endo/Heme/Allergies: Bruises/bleeds easily.  Psychiatric/Behavioral: Positive for depression and memory loss. The patient is nervous/anxious.  All other systems reviewed and are negative.   Medical History: Past Medical History:  Diagnosis Date  Anxiety  Arthritis  History of cancer    Past Surgical History:  Procedure Laterality Date  HYSTERECTOMY  JOINT  REPLACEMENT  MASTECTOMY   Allergies  Allergen Reactions  Morphine Hallucination and Other (See Comments)  hallucinations   Current Outpatient Medications on File Prior to Visit  Medication Sig Dispense Refill  aspirin 81 MG chewable tablet 1 tablet Orally Once a day  cholecalciferol (VITAMIN D3) 5,000 unit capsule Take by mouth  clonazePAM (KLONOPIN) 1 MG tablet Take 1 tablet orally once a day for 90 days  cyanocobalamin, vitamin B-12, 2,500 mcg Tab Take by mouth  cyclobenzaprine (FLEXERIL) 10 MG tablet Take 20 mg by mouth at bedtime  fluorouraciL (EFUDEX) 5 % cream Apply topically  HYDROcodone-acetaminophen (NORCO) 10-325 mg tablet TAKE 1 TABLET BY MOUTH FIVE TIMES DAILY AS NEEDED  metoprolol tartrate (LOPRESSOR) 25 MG tablet Take 25 mg by mouth 2 (two) times daily  nitrofurantoin, macrocrystal-monohydrate, (MACROBID) 100 MG capsule Take 100 mg by mouth once daily  rosuvastatin (CRESTOR) 5 MG tablet Take 5 mg by mouth once daily  sulfamethoxazole-trimethoprim (BACTRIM DS) 800-160 mg tablet Take 1 tablet by mouth 2 (two) times daily  triamterene-hydroCHLOROthiazide (MAXZIDE-25) 37.5-25 mg tablet TAKE 1 TABLET BY MOUTH IN THE MORNING for 90  valsartan (DIOVAN) 320 MG tablet Take 1 tablet by mouth once daily  venlafaxine (EFFEXOR-XR) 75 MG XR capsule Take 75 mg by mouth 2 (two) times daily  zolpidem (AMBIEN) 10 mg tablet TAKE 1/2 TO 1 (ONE-HALF TO ONE) TABLET BY MOUTH AT BEDTIME AS NEEDED FOR SLEEP    Family History  Problem Relation Age of Onset  High blood pressure (Hypertension) Mother  Hyperlipidemia (Elevated cholesterol) Mother  Skin cancer Father  Obesity Father  Hyperlipidemia (Elevated cholesterol) Father  High   blood pressure (Hypertension) Father  Deep vein thrombosis (DVT or abnormal blood clot formation) Sister  High blood pressure (Hypertension) Sister  Obesity Sister    Social History   Tobacco Use  Smoking Status Never  Smokeless Tobacco Never  Marital  status: Single  Tobacco Use  Smoking status: Never  Smokeless tobacco: Never  Substance and Sexual Activity  Alcohol use: Not Currently  Drug use: Not Currently   Objective:   Physical Exam Vitals reviewed.  Constitutional:  Appearance: Normal appearance.  Chest:  Breasts: Left: Mass present.  Comments: At t junction there is a 5 mm mass that I believe is where shave biopsy was done, multiple other hard areas difficult to tell if they are just scar tissue Lymphadenopathy:  Upper Body:  Left upper body: No supraclavicular or axillary adenopathy.  Neurological:  Mental Status: She is alert.    Assessment and Plan:   Chest wall recurrence of left breast cancer (CMS-HCC)  I agree with the PET scan to make sure there is no distant disease. There is no real evidence of local therapy being beneficial if she has distant disease. I also think imaging her chest wall is important. She is really unable to go ahead and get a MRI but I think that an ultrasound would be helpful. Once we get this information we will figure out the next step.  

## 2022-11-26 NOTE — Interval H&P Note (Signed)
History and Physical Interval Note:  11/26/2022 7:13 AM  Debra Griffith  has presented today for surgery, with the diagnosis of RECURRENT LEFT BREAST CANCER.  The various methods of treatment have been discussed with the patient and family. After consideration of risks, benefits and other options for treatment, the patient has consented to  Procedure(s): LEFT CHEST WALL MASS EXCISION (Left) as a surgical intervention.  The patient's history has been reviewed, patient examined, no change in status, stable for surgery.  I have reviewed the patient's chart and labs.  Questions were answered to the patient's satisfaction.     Rolm Bookbinder

## 2022-11-26 NOTE — Transfer of Care (Signed)
Immediate Anesthesia Transfer of Care Note  Patient: Debra Griffith  Procedure(s) Performed: LEFT CHEST WALL MASS EXCISION (Left: Breast)  Patient Location: PACU  Anesthesia Type:General  Level of Consciousness: drowsy and patient cooperative  Airway & Oxygen Therapy: Patient Spontanous Breathing and Patient connected to face mask oxygen  Post-op Assessment: Report given to RN and Post -op Vital signs reviewed and stable  Post vital signs: Reviewed and stable  Last Vitals:  Vitals Value Taken Time  BP 191/90 11/26/22 0837  Temp    Pulse 66 11/26/22 0840  Resp 16 11/26/22 0840  SpO2 95 % 11/26/22 0840  Vitals shown include unvalidated device data.  Last Pain:  Vitals:   11/26/22 0637  TempSrc:   PainSc: 0-No pain         Complications:  Encounter Notable Events  Notable Event Outcome Phase Comment  Difficult to intubate - expected  Intraprocedure Filed from anesthesia note documentation.

## 2022-11-26 NOTE — H&P (Signed)
82 year old female who I know well from prior surgery. She underwent an initial lumpectomy as well as sentinel lymph node biopsy in 2017. This was followed by oncoplastic breast reconstruction and the left side had significant fat necrosis and wound issues so she we then proceeded and did a left mastectomy due to the nonhealing wound and fat necrosis. Her initial pathology was an invasive ductal carcinoma with 1 negative sentinel lymph node and ITC's and an additional lymph node. This tumor was 90% ER and PR positive, HER2 negative, with a low Ki-67. She was then treated with anastrozole for 5 years. She has been doing okay. She had an area on her chest that she was concerned about she underwent a shave biopsy at her dermatologist. This shows this to be metastatic breast adenocarcinoma extending to the edge and the base of the shave biopsy. This is greater than 95% ER positive, 80% PR positive, HER2 is negative. She is mostly confined to a scooter and is not able we will really ambulate at this point. She is here with her husband and her daughter today. Pet is negative.  Review of Systems: A complete review of systems was obtained from the patient. I have reviewed this information and discussed as appropriate with the patient. See HPI as well for other ROS.  Review of Systems  Eyes: Positive for blurred vision.  Respiratory: Positive for shortness of breath.  Cardiovascular: Positive for leg swelling.  Gastrointestinal: Positive for constipation.  Musculoskeletal: Positive for neck pain.  Neurological: Positive for weakness.  Endo/Heme/Allergies: Bruises/bleeds easily.  Psychiatric/Behavioral: Positive for depression and memory loss. The patient is nervous/anxious.  All other systems reviewed and are negative.   Medical History: Past Medical History:  Diagnosis Date  Anxiety  Arthritis  History of cancer    Past Surgical History:  Procedure Laterality Date  HYSTERECTOMY  JOINT  REPLACEMENT  MASTECTOMY   Allergies  Allergen Reactions  Morphine Hallucination and Other (See Comments)  hallucinations   Current Outpatient Medications on File Prior to Visit  Medication Sig Dispense Refill  aspirin 81 MG chewable tablet 1 tablet Orally Once a day  cholecalciferol (VITAMIN D3) 5,000 unit capsule Take by mouth  clonazePAM (KLONOPIN) 1 MG tablet Take 1 tablet orally once a day for 90 days  cyanocobalamin, vitamin B-12, 2,500 mcg Tab Take by mouth  cyclobenzaprine (FLEXERIL) 10 MG tablet Take 20 mg by mouth at bedtime  fluorouraciL (EFUDEX) 5 % cream Apply topically  HYDROcodone-acetaminophen (NORCO) 10-325 mg tablet TAKE 1 TABLET BY MOUTH FIVE TIMES DAILY AS NEEDED  metoprolol tartrate (LOPRESSOR) 25 MG tablet Take 25 mg by mouth 2 (two) times daily  nitrofurantoin, macrocrystal-monohydrate, (MACROBID) 100 MG capsule Take 100 mg by mouth once daily  rosuvastatin (CRESTOR) 5 MG tablet Take 5 mg by mouth once daily  sulfamethoxazole-trimethoprim (BACTRIM DS) 800-160 mg tablet Take 1 tablet by mouth 2 (two) times daily  triamterene-hydroCHLOROthiazide (MAXZIDE-25) 37.5-25 mg tablet TAKE 1 TABLET BY MOUTH IN THE MORNING for 90  valsartan (DIOVAN) 320 MG tablet Take 1 tablet by mouth once daily  venlafaxine (EFFEXOR-XR) 75 MG XR capsule Take 75 mg by mouth 2 (two) times daily  zolpidem (AMBIEN) 10 mg tablet TAKE 1/2 TO 1 (ONE-HALF TO ONE) TABLET BY MOUTH AT BEDTIME AS NEEDED FOR SLEEP    Family History  Problem Relation Age of Onset  High blood pressure (Hypertension) Mother  Hyperlipidemia (Elevated cholesterol) Mother  Skin cancer Father  Obesity Father  Hyperlipidemia (Elevated cholesterol) Father  High  blood pressure (Hypertension) Father  Deep vein thrombosis (DVT or abnormal blood clot formation) Sister  High blood pressure (Hypertension) Sister  Obesity Sister    Social History   Tobacco Use  Smoking Status Never  Smokeless Tobacco Never  Marital  status: Single  Tobacco Use  Smoking status: Never  Smokeless tobacco: Never  Substance and Sexual Activity  Alcohol use: Not Currently  Drug use: Not Currently   Objective:   Physical Exam Vitals reviewed.  Constitutional:  Appearance: Normal appearance.  Chest:  Breasts: Left: Mass present.  Comments: At t junction there is a 5 mm mass that I believe is where shave biopsy was done, multiple other hard areas difficult to tell if they are just scar tissue Lymphadenopathy:  Upper Body:  Left upper body: No supraclavicular or axillary adenopathy.  Neurological:  Mental Status: She is alert.    Assessment and Plan:   Chest wall recurrence of left breast cancer (CMS-HCC)  I agree with the PET scan to make sure there is no distant disease. There is no real evidence of local therapy being beneficial if she has distant disease. I also think imaging her chest wall is important. She is really unable to go ahead and get a MRI but I think that an ultrasound would be helpful. Once we get this information we will figure out the next step.

## 2022-11-26 NOTE — Op Note (Signed)
Preoperative diagnosis: recurrent breast cancer, chest wall Postoperative diagnosis: same as above Procedure: Excision of 4 mm chest wall mass (medial) with margin- this was previously biopsied lesion Excision of vertical scar with nodularity- 6x2x1 cm Punch biopsy 4 mm lesion lateral chest wall Surgeon: Dr Serita Grammes Anes: general EBL: minimal Complications none Drains none Specimens Medial chest wall mass marked short superior, long lateral Central chest wall lesion marked short superior, long lateral Punch biopsy lateral chest wall Sponge and needle count correct Dispo recovery stable  Indications: 82 year old female who I know well from prior surgery. She underwent an initial lumpectomy as well as sentinel lymph node biopsy in 2017. This was followed by oncoplastic breast reconstruction and the left side had significant fat necrosis and wound issues so she we then proceeded and did a left mastectomy due to the nonhealing wound and fat necrosis. Her initial pathology was an invasive ductal carcinoma with 1 negative sentinel lymph node and ITC's and an additional lymph node. This tumor was 90% ER and PR positive, HER2 negative, with a low Ki-67. She was then treated with anastrozole for 5 years.  She had an area on her chest that she was concerned about she underwent a shave biopsy at her dermatologist. This shows this to be metastatic breast adenocarcinoma extending to the edge and the base of the shave biopsy. This is greater than 95% ER positive, 80% PR positive, HER2 is negative. We discussed excision. The vertical limb is also concerning of scar and there is a skin lesion laterally we discussed excising as well   Procedure: After informed consent was obtained she was taken to the operating.  She was given antibiotics.  SCDs were placed.  He was placed under general anesthesia without complication.  He was prepped and draped in sterile sterile surgical fashion.  Surgical timeout was  performed.  She had a mark on the medial lesion that I had discussed with the dermatology PA.  The mark was placed by ultrasound guidance.  This was actually fairly easily palpable today where it was not when she was in the office before.  I then made an elliptical incision surrounding this mass and remove the mass with a clear margin around it.  I took the deep margin to the muscle.  I obtained hemostasis.  This was closed with 3-0 Vicryl's and 4 Monocryl.  I placed a single 3-0 nylon needed to take some of the tension off as well.  Glue was eventually placed.  I made an elliptical incision on the vertical limb of her old scar.  I then remove this with several different nodular areas that may very well just end up being scar.  I then obtained hemostasis.  I mobilized this all around.  I then closed this with 3-0 Vicryl.  I placed multiple 3-0 nylon sutures to take some of the tension off this incision.  I closed this with 4 Monocryl and glue.  I then did a 6 mm punch biopsy of the lateral skin lesion on the chest wall.  I closed this with a 3-0 nylon.  She tolerated this well was extubated transferred recovery stable.

## 2022-11-27 ENCOUNTER — Encounter (HOSPITAL_COMMUNITY): Payer: Self-pay | Admitting: General Surgery

## 2022-11-27 DIAGNOSIS — L718 Other rosacea: Secondary | ICD-10-CM | POA: Diagnosis not present

## 2022-11-27 DIAGNOSIS — C44212 Basal cell carcinoma of skin of right ear and external auricular canal: Secondary | ICD-10-CM | POA: Diagnosis not present

## 2022-11-29 LAB — SURGICAL PATHOLOGY

## 2022-12-04 ENCOUNTER — Other Ambulatory Visit: Payer: Self-pay | Admitting: General Surgery

## 2022-12-07 ENCOUNTER — Encounter (HOSPITAL_COMMUNITY): Payer: Self-pay | Admitting: General Surgery

## 2022-12-07 ENCOUNTER — Other Ambulatory Visit: Payer: Self-pay

## 2022-12-07 NOTE — Progress Notes (Addendum)
Completed phone interview with patients husband Ella Wargel. Emailed instruction to email on file. Instructed to call back with any questions.   COVID Vaccine Completed: yes  Date of COVID positive in last 90 days: no  PCP - Josetta Huddle, MD (retired)  Dr. Romona Curls Cardiologist -  Oncologist- Benay Pike, MD  PET- 10/11/22 Epic Chest x-ray - n/a EKG - 11/22/22 Epic- RBBB Stress Test - over 20 years ago ECHO - 08/30/16 Epic Cardiac Cath - n/a Pacemaker/ICD device last checked: n/a Spinal Cord Stimulator: n/a  Bowel Prep - no  Sleep Study - denies  CPAP -  no  Fasting Blood Sugar - n/a Checks Blood Sugar _____ times a day  Last dose of GLP1 agonist-  N/A GLP1 instructions:  N/A   Last dose of SGLT-2 inhibitors-  N/A SGLT-2 instructions: N/A   Blood Thinner Instructions: Aspirin Instructions: ASA 81, on hold for surgery  Last Dose:  Activity level: Can perform activities of daily living without stopping and without symptoms of chest pain or shortness of breath. No stairs, uses lift. Able to transfer from wheelchair to bed with assistance   Anesthesia review: a fib, HTN, RBBB  Patient denies shortness of breath, fever, cough and chest pain at PAT appointment  Patient verbalized understanding of instructions that were given to them at the PAT appointment. Patient was also instructed that they will need to review over the PAT instructions again at home before surgery.

## 2022-12-07 NOTE — Patient Instructions (Addendum)
SURGICAL WAITING ROOM VISITATION  Patients having surgery or a procedure may have no more than 2 support people in the waiting area - these visitors may rotate.    Children under the age of 47 must have an adult with them who is not the patient.  Due to an increase in RSV and influenza rates and associated hospitalizations, children ages 54 and under may not visit patients in [place].  If the patient needs to stay at the hospital during part of their recovery, the visitor guidelines for inpatient rooms apply. Pre-op nurse will coordinate an appropriate time for 1 support person to accompany patient in pre-op.  This support person may not rotate.    Please refer to the [facility] website for the visitor guidelines for Inpatients (after your surgery is over and you are in a regular room).    Your procedure is scheduled on: 12/10/22   Report to [facility] Main Entrance    Report to admitting at 5:15 AM   Call this number if you have problems the morning of surgery [phone]   Do not eat food :After Midnight.   After Midnight you may have the following liquids until 4:30 AM DAY OF SURGERY  Water Non-Citrus Juices (without pulp, NO RED-Apple, White grape, White cranberry) Black Coffee (NO MILK/CREAM OR CREAMERS, sugar ok)  Clear Tea (NO MILK/CREAM OR CREAMERS, sugar ok) regular and decaf                             Plain Jell-O (NO RED)                                           Fruit ices (not with fruit pulp, NO RED)                                     Popsicles (NO RED)                                                               Sports drinks like Gatorade (NO RED)            If you have questions, please contact your surgeon's office.   FOLLOW BOWEL PREP AND ANY ADDITIONAL PRE OP INSTRUCTIONS YOU RECEIVED FROM YOUR SURGEON'S OFFICE!!!     Oral Hygiene is also important to reduce your risk of infection.                                    Remember -  BRUSH YOUR TEETH THE MORNING OF SURGERY WITH YOUR REGULAR TOOTHPASTE  DENTURES WILL BE REMOVED PRIOR TO SURGERY PLEASE DO NOT APPLY "Poly grip" OR ADHESIVES!!!   Take these medicines the morning of surgery with A SIP OF WATER: Clonazepam, Norco, Metoprolol, Rosuvastatin, Venlafaxine                              You may not  have any metal on your body including hair pins, jewelry, and body piercing             Do not wear make-up, lotions, powders, perfumes, or deodorant  Do not wear nail polish including gel and S&S, artificial/acrylic nails, or any other type of covering on natural nails including finger and toenails. If you have artificial nails, gel coating, etc. that needs to be removed by a nail salon please have this removed prior to surgery or surgery may need to be canceled/ delayed if the surgeon/ anesthesia feels like they are unable to be safely monitored.   Do not shave  48 hours prior to surgery.    Do not bring valuables to the hospital. [place].   Contacts, glasses, dentures or bridgework may not be worn into surgery.  DO NOT [place]. PHARMACY WILL DISPENSE MEDICATIONS LISTED ON YOUR MEDICATION LIST TO YOU DURING YOUR ADMISSION [place]!    Patients discharged on the day of surgery will not be allowed to drive home.  Someone NEEDS to stay with you for the first 24 hours after anesthesia.              Please read over the following fact sheets you were given: IF [place] [phone] Schneiders   If you received a COVID test during your pre-op visit  it is requested that you wear a mask when out in public, stay away from anyone that may not be feeling well and notify your surgeon if you develop symptoms. If you test positive for Covid or have been in contact with anyone that has tested positive in the last 10 days please notify you  surgeon.    [place] - Preparing for Surgery Before surgery, you can play an important role.  Because skin is not sterile, your skin needs to be as free of germs as possible.  You can reduce the number of germs on your skin by washing with CHG (chlorahexidine gluconate) soap before surgery.  CHG is an antiseptic cleaner which kills germs and bonds with the skin to continue killing germs even after washing. Please DO NOT use if you have an allergy to CHG or antibacterial soaps.  If your skin becomes reddened/irritated stop using the CHG and inform your nurse when you arrive at Short Stay. Do not shave (including legs and underarms) for at least 48 hours prior to the first CHG shower.  You may shave your face/neck.  Please follow these instructions carefully:  1.  Shower with CHG Soap the night before surgery and the  morning of surgery.  2.  If you choose to wash your hair, wash your hair first as usual with your normal  shampoo.  3.  After you shampoo, rinse your hair and body thoroughly to remove the shampoo.                             4.  Use CHG as you would any other liquid soap.  You can apply chg directly to the skin and wash.  Gently with a scrungie or clean washcloth.  5.  Apply the CHG Soap to your body ONLY FROM THE NECK DOWN.   Do   not use on face/ open  Wound or open sores. Avoid contact with eyes, ears mouth and   genitals (private parts).                       Wash face,  Genitals (private parts) with your normal soap.             6.  Wash thoroughly, paying special attention to the area where your    surgery  will be performed.  7.  Thoroughly rinse your body with warm water from the neck down.  8.  DO NOT shower/wash with your normal soap after using and rinsing off the CHG Soap.                9.  Pat yourself dry with a clean towel.            10.  Wear clean pajamas.            11.  Place clean sheets on your bed the night of your first shower and  do not  sleep with pets. Day of Surgery : Do not apply any lotions/deodorants the morning of surgery.  Please wear clean clothes to the hospital/surgery center.  FAILURE TO FOLLOW THESE INSTRUCTIONS MAY RESULT IN THE CANCELLATION OF YOUR SURGERY  PATIENT SIGNATURE_________________________________  NURSE SIGNATURE__________________________________  ________________________________________________________________________

## 2022-12-09 NOTE — Anesthesia Preprocedure Evaluation (Signed)
Anesthesia Evaluation  Patient identified by MRN, date of birth, ID band Patient awake    Reviewed: Allergy & Precautions, NPO status , Patient's Chart, lab work & pertinent test results  History of Anesthesia Complications (+) PONV, DIFFICULT AIRWAY and history of anesthetic complications  Airway Mallampati: II  TM Distance: <3 FB Neck ROM: Limited   Comment: Previous grade I view with glidescope 3, easy mask Dental  (+) Dental Advisory Given,  Reports all teeth are veneers:   Pulmonary neg shortness of breath, neg sleep apnea, neg COPD, neg recent URI Chronic bronchitis   Pulmonary exam normal breath sounds clear to auscultation       Cardiovascular hypertension (metoprolol, triamterene-HCTZ, valsartan), Pt. on home beta blockers and Pt. on medications (-) angina (-) Past MI, (-) Cardiac Stents and (-) CABG + dysrhythmias (RBBB, h/o afib 2017)  Rhythm:Regular Rate:Normal  HLD  TTE 08/30/2016: Study Conclusions   - Left ventricle: The cavity size was normal. There was moderate    concentric hypertrophy. Systolic function was normal. The    estimated ejection fraction was in the range of 55% to 60%. Wall    motion was normal; there were no regional wall motion    abnormalities.     Neuro/Psych neg Seizures PSYCHIATRIC DISORDERS Anxiety Depression    Chronic low back pain    GI/Hepatic Neg liver ROS,GERD  ,,  Endo/Other  neg diabetes  Morbid obesity  Renal/GU Renal InsufficiencyRenal disease     Musculoskeletal  (+) Arthritis , Osteoarthritis and Rheumatoid disorders,    Abdominal  (+) + obese  Peds  Hematology  (+) Blood dyscrasia, anemia   Anesthesia Other Findings Recurrent left breast cancer  Reproductive/Obstetrics                             Anesthesia Physical Anesthesia Plan  ASA: 3  Anesthesia Plan: General   Post-op Pain Management: Tylenol PO (pre-op)*   Induction:  Intravenous  PONV Risk Score and Plan: 4 or greater and Ondansetron and Dexamethasone  Airway Management Planned: Oral ETT and Video Laryngoscope Planned  Additional Equipment:   Intra-op Plan:   Post-operative Plan: Extubation in OR  Informed Consent: I have reviewed the patients History and Physical, chart, labs and discussed the procedure including the risks, benefits and alternatives for the proposed anesthesia with the patient or authorized representative who has indicated his/her understanding and acceptance.     Dental advisory given  Plan Discussed with: CRNA and Anesthesiologist  Anesthesia Plan Comments: (Patient reports that all teeth are veneers and are always damaged with anesthesia. Discussed that we will be careful with all teeth, but that injury to teeth is possible. Patient expressed understanding.  Preop albuterol nebulizer  Risks of general anesthesia discussed including, but not limited to, sore throat, hoarse voice, chipped/damaged teeth, injury to vocal cords, nausea and vomiting, allergic reactions, lung infection, heart attack, stroke, and death. All questions answered. )        Anesthesia Quick Evaluation

## 2022-12-10 ENCOUNTER — Ambulatory Visit (HOSPITAL_BASED_OUTPATIENT_CLINIC_OR_DEPARTMENT_OTHER): Payer: Medicare Other | Admitting: Physician Assistant

## 2022-12-10 ENCOUNTER — Ambulatory Visit (HOSPITAL_COMMUNITY): Payer: Medicare Other | Admitting: Physician Assistant

## 2022-12-10 ENCOUNTER — Ambulatory Visit (HOSPITAL_COMMUNITY)
Admission: RE | Admit: 2022-12-10 | Discharge: 2022-12-10 | Disposition: A | Discharge status: Home / Self Care | Payer: Medicare Other | Attending: General Surgery | Admitting: General Surgery

## 2022-12-10 ENCOUNTER — Encounter (HOSPITAL_COMMUNITY)
Admission: RE | Disposition: A | Discharge status: Home / Self Care | Payer: Self-pay | Source: Home / Self Care | Attending: General Surgery

## 2022-12-10 ENCOUNTER — Encounter (HOSPITAL_COMMUNITY): Payer: Self-pay | Admitting: General Surgery

## 2022-12-10 ENCOUNTER — Other Ambulatory Visit: Payer: Self-pay

## 2022-12-10 DIAGNOSIS — I4891 Unspecified atrial fibrillation: Secondary | ICD-10-CM

## 2022-12-10 DIAGNOSIS — C50212 Malignant neoplasm of upper-inner quadrant of left female breast: Secondary | ICD-10-CM | POA: Diagnosis not present

## 2022-12-10 DIAGNOSIS — I4510 Unspecified right bundle-branch block: Secondary | ICD-10-CM

## 2022-12-10 DIAGNOSIS — F418 Other specified anxiety disorders: Secondary | ICD-10-CM

## 2022-12-10 DIAGNOSIS — D630 Anemia in neoplastic disease: Secondary | ICD-10-CM | POA: Diagnosis not present

## 2022-12-10 DIAGNOSIS — Z170 Estrogen receptor positive status [ER+]: Secondary | ICD-10-CM | POA: Insufficient documentation

## 2022-12-10 DIAGNOSIS — Z79899 Other long term (current) drug therapy: Secondary | ICD-10-CM | POA: Insufficient documentation

## 2022-12-10 DIAGNOSIS — C761 Malignant neoplasm of thorax: Secondary | ICD-10-CM | POA: Diagnosis not present

## 2022-12-10 DIAGNOSIS — I10 Essential (primary) hypertension: Secondary | ICD-10-CM | POA: Diagnosis not present

## 2022-12-10 DIAGNOSIS — C50912 Malignant neoplasm of unspecified site of left female breast: Secondary | ICD-10-CM

## 2022-12-10 DIAGNOSIS — F419 Anxiety disorder, unspecified: Secondary | ICD-10-CM | POA: Diagnosis not present

## 2022-12-10 DIAGNOSIS — Z9012 Acquired absence of left breast and nipple: Secondary | ICD-10-CM | POA: Insufficient documentation

## 2022-12-10 DIAGNOSIS — C792 Secondary malignant neoplasm of skin: Secondary | ICD-10-CM | POA: Diagnosis not present

## 2022-12-10 HISTORY — PX: MASS EXCISION: SHX2000

## 2022-12-10 LAB — CBC
HCT: 44.1 % (ref 36.0–46.0)
Hemoglobin: 13.5 g/dL (ref 12.0–15.0)
MCH: 28.3 pg (ref 26.0–34.0)
MCHC: 30.6 g/dL (ref 30.0–36.0)
MCV: 92.5 fL (ref 80.0–100.0)
Platelets: 233 10*3/uL (ref 150–400)
RBC: 4.77 MIL/uL (ref 3.87–5.11)
RDW: 13.8 % (ref 11.5–15.5)
WBC: 9.1 10*3/uL (ref 4.0–10.5)
nRBC: 0 % (ref 0.0–0.2)

## 2022-12-10 LAB — BASIC METABOLIC PANEL
Anion gap: 13 (ref 5–15)
BUN: 19 mg/dL (ref 8–23)
CO2: 26 mmol/L (ref 22–32)
Calcium: 9.1 mg/dL (ref 8.9–10.3)
Chloride: 100 mmol/L (ref 98–111)
Creatinine, Ser: 1.05 mg/dL — ABNORMAL HIGH (ref 0.44–1.00)
GFR, Estimated: 54 mL/min — ABNORMAL LOW (ref >60)
Glucose, Bld: 128 mg/dL — ABNORMAL HIGH (ref 70–99)
Potassium: 3.6 mmol/L (ref 3.5–5.1)
Sodium: 139 mmol/L (ref 135–145)

## 2022-12-10 SURGERY — EXCISION MASS
Anesthesia: General | Laterality: Left

## 2022-12-10 MED ORDER — OXYCODONE HCL 5 MG PO TABS: 5.0000 mg | ORAL_TABLET | Freq: Once | ORAL | Status: DC | PRN

## 2022-12-10 MED ORDER — FENTANYL CITRATE (PF) 100 MCG/2ML IJ SOLN
INTRAMUSCULAR | Status: AC
  Filled 2022-12-10: qty 2

## 2022-12-10 MED ORDER — LIDOCAINE HCL (PF) 2 % IJ SOLN
INTRAMUSCULAR | Status: AC
  Filled 2022-12-10: qty 5

## 2022-12-10 MED ORDER — LACTATED RINGERS IV SOLN: INTRAVENOUS | Status: DC

## 2022-12-10 MED ORDER — BUPIVACAINE-EPINEPHRINE (PF) 0.25% -1:200000 IJ SOLN
INTRAMUSCULAR | Status: AC
  Filled 2022-12-10: qty 30

## 2022-12-10 MED ORDER — ONDANSETRON HCL 4 MG/2ML IJ SOLN
INTRAMUSCULAR | Status: AC
  Filled 2022-12-10: qty 2

## 2022-12-10 MED ORDER — SODIUM CHLORIDE 0.9% FLUSH: 3.0000 mL | INTRAVENOUS | Status: DC | PRN

## 2022-12-10 MED ORDER — PROPOFOL 10 MG/ML IV BOLUS
INTRAVENOUS | Status: DC | PRN
  Administered 2022-12-10: 100 mg via INTRAVENOUS

## 2022-12-10 MED ORDER — FENTANYL CITRATE PF 50 MCG/ML IJ SOSY
25.0000 ug | PREFILLED_SYRINGE | INTRAMUSCULAR | Status: DC | PRN
  Administered 2022-12-10: 50 ug via INTRAVENOUS

## 2022-12-10 MED ORDER — DEXAMETHASONE SODIUM PHOSPHATE 10 MG/ML IJ SOLN
INTRAMUSCULAR | Status: AC
  Filled 2022-12-10: qty 1

## 2022-12-10 MED ORDER — ROCURONIUM BROMIDE 10 MG/ML (PF) SYRINGE
PREFILLED_SYRINGE | INTRAVENOUS | Status: AC
  Filled 2022-12-10: qty 10

## 2022-12-10 MED ORDER — AMISULPRIDE (ANTIEMETIC) 5 MG/2ML IV SOLN: 10.0000 mg | Freq: Once | INTRAVENOUS | Status: DC | PRN

## 2022-12-10 MED ORDER — ACETAMINOPHEN 650 MG RE SUPP: 650.0000 mg | RECTAL | Status: DC | PRN

## 2022-12-10 MED ORDER — FENTANYL CITRATE (PF) 100 MCG/2ML IJ SOLN
INTRAMUSCULAR | Status: DC | PRN
  Administered 2022-12-10 (×2): 25 ug via INTRAVENOUS

## 2022-12-10 MED ORDER — FENTANYL CITRATE PF 50 MCG/ML IJ SOSY
PREFILLED_SYRINGE | INTRAMUSCULAR | Status: AC
  Filled 2022-12-10: qty 1

## 2022-12-10 MED ORDER — CHLORHEXIDINE GLUCONATE CLOTH 2 % EX PADS: 6.0000 | MEDICATED_PAD | Freq: Once | CUTANEOUS | Status: DC

## 2022-12-10 MED ORDER — ENSURE PRE-SURGERY PO LIQD
296.0000 mL | Freq: Once | ORAL | Status: DC
  Filled 2022-12-10: qty 296

## 2022-12-10 MED ORDER — ORAL CARE MOUTH RINSE: 15.0000 mL | Freq: Once | OROMUCOSAL | Status: AC

## 2022-12-10 MED ORDER — SUCCINYLCHOLINE CHLORIDE 200 MG/10ML IV SOSY
PREFILLED_SYRINGE | INTRAVENOUS | Status: DC | PRN
  Administered 2022-12-10: 120 mg via INTRAVENOUS

## 2022-12-10 MED ORDER — BUPIVACAINE-EPINEPHRINE 0.25% -1:200000 IJ SOLN
INTRAMUSCULAR | Status: DC | PRN
  Administered 2022-12-10: 10 mL

## 2022-12-10 MED ORDER — OXYCODONE HCL 5 MG PO TABS
ORAL_TABLET | ORAL | Status: AC
  Filled 2022-12-10: qty 1

## 2022-12-10 MED ORDER — ACETAMINOPHEN 500 MG PO TABS
1000.0000 mg | ORAL_TABLET | ORAL | Status: DC
  Filled 2022-12-10: qty 2

## 2022-12-10 MED ORDER — DEXAMETHASONE SODIUM PHOSPHATE 10 MG/ML IJ SOLN
INTRAMUSCULAR | Status: DC | PRN
  Administered 2022-12-10: 4 mg via INTRAVENOUS

## 2022-12-10 MED ORDER — CHLORHEXIDINE GLUCONATE 0.12 % MT SOLN
15.0000 mL | Freq: Once | OROMUCOSAL | Status: AC
  Administered 2022-12-10: 15 mL via OROMUCOSAL

## 2022-12-10 MED ORDER — SODIUM CHLORIDE 0.9 % IV SOLN: 250.0000 mL | INTRAVENOUS | Status: DC | PRN

## 2022-12-10 MED ORDER — ACETAMINOPHEN 325 MG PO TABS: 650.0000 mg | ORAL_TABLET | ORAL | Status: DC | PRN

## 2022-12-10 MED ORDER — LIDOCAINE 2% (20 MG/ML) 5 ML SYRINGE
INTRAMUSCULAR | Status: DC | PRN
  Administered 2022-12-10: 100 mg via INTRAVENOUS

## 2022-12-10 MED ORDER — OXYCODONE HCL 5 MG/5ML PO SOLN: 5.0000 mg | Freq: Once | ORAL | Status: DC | PRN

## 2022-12-10 MED ORDER — CEFAZOLIN SODIUM-DEXTROSE 2-4 GM/100ML-% IV SOLN
2.0000 g | INTRAVENOUS | Status: AC
  Administered 2022-12-10: 2 g via INTRAVENOUS
  Filled 2022-12-10: qty 100

## 2022-12-10 MED ORDER — ALBUTEROL SULFATE (2.5 MG/3ML) 0.083% IN NEBU
2.5000 mg | INHALATION_SOLUTION | Freq: Once | RESPIRATORY_TRACT | Status: AC
  Administered 2022-12-10: 2.5 mg via RESPIRATORY_TRACT
  Filled 2022-12-10: qty 3

## 2022-12-10 MED ORDER — ONDANSETRON HCL 4 MG/2ML IJ SOLN
INTRAMUSCULAR | Status: DC | PRN
  Administered 2022-12-10: 4 mg via INTRAVENOUS

## 2022-12-10 MED ORDER — PROPOFOL 10 MG/ML IV BOLUS
INTRAVENOUS | Status: AC
  Filled 2022-12-10: qty 20

## 2022-12-10 SURGICAL SUPPLY — 48 items
ADH SKN CLS APL DERMABOND .7 (GAUZE/BANDAGES/DRESSINGS)
APL PRP STRL LF DISP 70% ISPRP (MISCELLANEOUS)
BAG COUNTER SPONGE SURGICOUNT (BAG) IMPLANT
BAG SPNG CNTER NS LX DISP (BAG)
BINDER BREAST BLACK XL (GAUZE/BANDAGES/DRESSINGS) IMPLANT
BLADE HEX COATED 2.75 (ELECTRODE) ×1 IMPLANT
BLADE SURG 15 STRL LF DISP TIS (BLADE) ×3 IMPLANT
BLADE SURG 15 STRL SS (BLADE) ×3
CHLORAPREP W/TINT 26 (MISCELLANEOUS) IMPLANT
COVER SURGICAL LIGHT HANDLE (MISCELLANEOUS) ×1 IMPLANT
DERMABOND ADVANCED .7 DNX12 (GAUZE/BANDAGES/DRESSINGS) IMPLANT
DRAPE LAPAROSCOPIC ABDOMINAL (DRAPES) ×1 IMPLANT
ELECT REM PT RETURN 15FT ADLT (MISCELLANEOUS) ×1 IMPLANT
GAUZE 4X4 16PLY ~~LOC~~+RFID DBL (SPONGE) ×1 IMPLANT
GAUZE PAD ABD 8X10 STRL (GAUZE/BANDAGES/DRESSINGS) IMPLANT
GAUZE SPONGE 4X4 12PLY STRL (GAUZE/BANDAGES/DRESSINGS) IMPLANT
GLOVE BIO SURGEON STRL SZ7 (GLOVE) ×1 IMPLANT
GLOVE BIOGEL PI IND STRL 7.0 (GLOVE) ×1 IMPLANT
GLOVE BIOGEL PI IND STRL 7.5 (GLOVE) ×1 IMPLANT
GOWN STRL REUS W/ TWL LRG LVL3 (GOWN DISPOSABLE) ×2 IMPLANT
GOWN STRL REUS W/ TWL XL LVL3 (GOWN DISPOSABLE) ×1 IMPLANT
GOWN STRL REUS W/TWL LRG LVL3 (GOWN DISPOSABLE) ×2
GOWN STRL REUS W/TWL XL LVL3 (GOWN DISPOSABLE) ×1
HEMOSTAT ARISTA ABSORB 3G PWDR (HEMOSTASIS) IMPLANT
KIT BASIN OR (CUSTOM PROCEDURE TRAY) ×1 IMPLANT
KIT TURNOVER KIT A (KITS) IMPLANT
MARKER SKIN DUAL TIP RULER LAB (MISCELLANEOUS) ×1 IMPLANT
NDL HYPO 22X1.5 SAFETY MO (MISCELLANEOUS) IMPLANT
NDL HYPO 25X1 1.5 SAFETY (NEEDLE) ×1 IMPLANT
NEEDLE HYPO 22X1.5 SAFETY MO (MISCELLANEOUS) IMPLANT
NEEDLE HYPO 25X1 1.5 SAFETY (NEEDLE) ×1 IMPLANT
PACK BASIC VI WITH GOWN DISP (CUSTOM PROCEDURE TRAY) ×1 IMPLANT
PENCIL SMOKE EVACUATOR (MISCELLANEOUS) IMPLANT
SPIKE FLUID TRANSFER (MISCELLANEOUS) ×1 IMPLANT
STRIP CLOSURE SKIN 1/2X4 (GAUZE/BANDAGES/DRESSINGS) ×1 IMPLANT
SUT ETHILON 3 0 PS 1 (SUTURE) IMPLANT
SUT MNCRL AB 4-0 PS2 18 (SUTURE) ×1 IMPLANT
SUT SILK 2 0SH CR/8 30 (SUTURE) IMPLANT
SUT VIC AB 2-0 SH 27 (SUTURE) ×2
SUT VIC AB 2-0 SH 27X BRD (SUTURE) ×1 IMPLANT
SUT VIC AB 3-0 SH 27 (SUTURE) ×1
SUT VIC AB 3-0 SH 27XBRD (SUTURE) ×1 IMPLANT
SYR BULB IRRIG 60ML STRL (SYRINGE) IMPLANT
SYR CONTROL 10ML LL (SYRINGE) ×1 IMPLANT
TAPE SURG TRANSPORE 1 IN (GAUZE/BANDAGES/DRESSINGS) IMPLANT
TAPE SURGICAL TRANSPORE 1 IN (GAUZE/BANDAGES/DRESSINGS) ×1
TOWEL OR 17X26 10 PK STRL BLUE (TOWEL DISPOSABLE) ×1 IMPLANT
TOWEL OR NON WOVEN STRL DISP B (DISPOSABLE) ×1 IMPLANT

## 2022-12-10 NOTE — Transfer of Care (Signed)
Immediate Anesthesia Transfer of Care Note  Patient: Debra Griffith  Procedure(s) Performed: LEFT CHEST WALL MASS MARGIN EXCISION (Left)  Patient Location: PACU  Anesthesia Type:General  Level of Consciousness: awake and patient cooperative  Airway & Oxygen Therapy: Patient Spontanous Breathing and Patient connected to face mask oxygen  Post-op Assessment: Report given to RN and Post -op Vital signs reviewed and stable  Post vital signs: Reviewed and stable  Last Vitals:  Vitals Value Taken Time  BP 159/75 12/10/22 0826  Temp    Pulse 81 12/10/22 0829  Resp 17 12/10/22 0829  SpO2 100 % 12/10/22 0829  Vitals shown include unvalidated device data.  Last Pain: There were no vitals filed for this visit.       Complications: No notable events documented.

## 2022-12-10 NOTE — Discharge Instructions (Signed)
[facility], [place] [id number]   POST OP INSTRUCTIONS Take 400 mg of ibuprofen every 8 hours or 650 mg tylenol every 6 hours for next 72 hours then as needed. Use ice several times daily also. Always review your discharge instruction sheet given to you by the facility where your surgery was performed.  A prescription for pain medication may be given to you upon discharge.  Take your pain medication as prescribed, if needed.  If narcotic pain medicine is not needed, then you may take acetaminophen (Tylenol), naprosyn (Alleve) or ibuprofen (Advil) as needed. Take your usually prescribed medications unless otherwise directed. If you need a refill on your pain medication, please contact your pharmacy.  They will contact our office to request authorization.  Prescriptions will not be filled after 5pm or on week-ends. You should follow a light diet the first 24 hours after surgery.  Resume your normal diet the day after surgery. Most patients will experience some swelling and bruising on the chest  Ice packs will help.  Swelling and bruising can take several days to resolve. Wear the binder or [place] bra for 72 hours day and night. After night please wear during the day.  It is common to experience some constipation if taking pain medication after surgery.  Increasing fluid intake and taking a stool softener (such as Colace) will usually help or prevent this problem from occurring.  A mild laxative (Milk of Magnesia or Miralax) should be taken according to package instructions if there are no bowel movements after 48 hours. There is glue and steristrips on your incision. They will come off in the next few weeks.  You may take a shower 48 hours after surgery.  Any sutures will be removed at an office visit ACTIVITIES:  You may resume regular (light) daily activities beginning the next day--such as daily self-care, walking, climbing stairs--gradually increasing activities as tolerated.  Refrain  from any heavy lifting or straining until approved by your doctor. You may drive when you are no longer taking prescription pain medication, you can comfortably wear a seatbelt, and you can safely maneuver your car and apply brakes. RETURN TO WORK:  __________________________________________________________ [name] should see your doctor in the office for a follow-up appointment approximately 3-5 days after your surgery.  Your doctor's nurse will typically make your follow-up appointment when she calls you with your pathology report.  Expect your pathology report 3-4 business days after surgery. OTHER INSTRUCTIONS: ______________________________________________________________________________________________ ____________________________________________________________________________________________ WHEN TO CALL YOUR DR Zanyah Lentsch: Fever over 101.0 Nausea and/or vomiting Extreme swelling or bruising Continued bleeding from incision. Increased pain, redness, or drainage from the incision. The clinic staff is available to answer your questions during regular business hours.  Please don't hesitate to call and ask to speak to one of the nurses for clinical concerns.  If you have a medical emergency, go to the nearest emergency room or call 911.  A surgeon from [facility] is always on call at the hospital. [address] [place], [place], [place]  [address] ? [address] [place], [place], [place]   [address] [phone] ? [phone] ? FAX [phone] Web site: [email or web address]

## 2022-12-10 NOTE — Anesthesia Procedure Notes (Signed)
Procedure Name: Intubation Date/Time: 12/10/2022 7:49 AM  Performed by: West Pugh, CRNAPre-anesthesia Checklist: Patient identified, Emergency Drugs available, Suction available, Patient being monitored and Timeout performed Patient Re-evaluated:Patient Re-evaluated prior to induction Oxygen Delivery Method: Circle system utilized Preoxygenation: Pre-oxygenation with 100% oxygen Induction Type: IV induction and Rapid sequence Laryngoscope Size: 3 and Glidescope Grade View: Grade I Tube type: Oral Tube size: 7.0 mm Number of attempts: 1 Airway Equipment and Method: Stylet Placement Confirmation: ETT inserted through vocal cords under direct vision, positive ETCO2, CO2 detector and breath sounds checked- equal and bilateral Secured at: 21 cm Tube secured with: Tape Dental Injury: Teeth and Oropharynx as per pre-operative assessment  Comments: Difficulty anticipated. Glidescope lopro #3 utilized. AOI.

## 2022-12-10 NOTE — Anesthesia Postprocedure Evaluation (Signed)
Anesthesia Post Note  Patient: Debra Griffith  Procedure(s) Performed: LEFT CHEST WALL MASS MARGIN EXCISION (Left)     Patient location during evaluation: PACU Anesthesia Type: General Level of consciousness: awake Pain management: pain level controlled Vital Signs Assessment: post-procedure vital signs reviewed and stable Respiratory status: spontaneous breathing, nonlabored ventilation and respiratory function stable Cardiovascular status: blood pressure returned to baseline and stable Postop Assessment: no apparent nausea or vomiting Anesthetic complications: no   No notable events documented.  Last Vitals:  Vitals:   12/10/22 0930 12/10/22 0940  BP:  (!) 162/66  Pulse: 80 85  Resp: 14 16  Temp:  36.6 C  SpO2: 93% 92%    Last Pain:  Vitals:   12/10/22 0940  PainSc: 2                  Nilda Simmer

## 2022-12-10 NOTE — Interval H&P Note (Signed)
History and Physical Interval Note:  12/10/2022 7:07 AM  Debra Griffith  has presented today for surgery, with the diagnosis of recurrent left breast cancer.  The various methods of treatment have been discussed with the patient and family. After consideration of risks, benefits and other options for treatment, the patient has consented to  Procedure(s): LEFT CHEST WALL MASS MARGIN EXCISION (Left) as a surgical intervention.  The patient's history has been reviewed, patient examined, no change in status, stable for surgery.  I have reviewed the patient's chart and labs.  Questions were answered to the patient's satisfaction.     Rolm Bookbinder

## 2022-12-10 NOTE — Op Note (Addendum)
  Preoperative diagnosis: recurrent breast cancer, chest wall, s/p excision Postoperative diagnosis: same as above Procedure:Re-excision of left chest wall margins for recurrent breast cancer, 6x4x2 cm Surgeon: Dr Serita Grammes Anes: general EBL: minimal Complications none Drains none Specimens left medial chest wall re-excision short superior, long lateral Sponge and needle count correct Dispo recovery stable   Indications: 82 year old female who I know well from prior surgery. She underwent an initial lumpectomy as well as sentinel lymph node biopsy in 2017. This was followed by oncoplastic breast reconstruction and the left side had significant fat necrosis and wound issues so she we then proceeded and did a left mastectomy due to the nonhealing wound and fat necrosis. Her initial pathology was an invasive ductal carcinoma with 1 negative sentinel lymph node and ITC's and an additional lymph node. This tumor was 90% ER and PR positive, HER2 negative, with a low Ki-67. She was then treated with anastrozole for 5 years.  She had an area on her chest that she was concerned about she underwent a shave biopsy at her dermatologist. This shows this to be metastatic breast adenocarcinoma extending to the edge and the base of the shave biopsy. This is greater than 95% ER positive, 80% PR positive, HER2 is negative. I excised these three areas with medial being cancer. It has a couple positive margins so we discussed re-excision.    Procedure: After informed consent was obtained she was taken to the operating.  She was given antibiotics.  SCDs were placed.  He was placed under general anesthesia without complication.  She was prepped and draped in sterile sterile surgical fashion.  Surgical timeout was performed.   I infiltrated marcaine and then re-excised the entire medial incision centered on the superior and the lateral margins.  I marked this as above. I obtained hemostasis. This tissue was friable  so I placed some Arista.  I then closed it with 2-0 vicryl and 3-0 vicryl for deep dermals. The skin was closed with multiple 3-0 sutures.  I did place some glue and steristrips as well  I removed the sutures in the other incisions also.    She tolerated this well was extubated transferred recovery stable.

## 2022-12-11 ENCOUNTER — Encounter (HOSPITAL_COMMUNITY): Payer: Self-pay | Admitting: General Surgery

## 2022-12-13 LAB — SURGICAL PATHOLOGY

## 2022-12-21 ENCOUNTER — Inpatient Hospital Stay: Payer: Medicare Other | Attending: Hematology and Oncology

## 2022-12-21 ENCOUNTER — Other Ambulatory Visit: Payer: Self-pay

## 2022-12-21 VITALS — BP 144/91 | HR 76 | Temp 98.2°F | Resp 20

## 2022-12-21 DIAGNOSIS — Z170 Estrogen receptor positive status [ER+]: Secondary | ICD-10-CM | POA: Diagnosis not present

## 2022-12-21 DIAGNOSIS — C50212 Malignant neoplasm of upper-inner quadrant of left female breast: Secondary | ICD-10-CM | POA: Insufficient documentation

## 2022-12-21 DIAGNOSIS — Z79810 Long term (current) use of selective estrogen receptor modulators (SERMs): Secondary | ICD-10-CM | POA: Diagnosis not present

## 2022-12-21 MED ORDER — FULVESTRANT 250 MG/5ML IM SOSY
500.0000 mg | PREFILLED_SYRINGE | Freq: Once | INTRAMUSCULAR | Status: AC
  Administered 2022-12-21: 500 mg via INTRAMUSCULAR
  Filled 2022-12-21: qty 10

## 2023-01-03 NOTE — Pre-Procedure Instructions (Signed)
Surgical Instructions    Your procedure is scheduled on January 10, 2023.  Report to [facility] Main Entrance "A" at 5:30 A.M., then check in with the Admitting office.  Call this number if you have problems the morning of surgery:  [phone]  If you have any questions prior to your surgery date call [phone]: Open Monday-Friday 8am-4pm If you experience any cold or flu symptoms such as cough, fever, chills, shortness of breath, etc. between now and your scheduled surgery, please notify us at the above number.     Remember:  Do not eat after midnight the night before your surgery  You may drink clear liquids until 4:30 AM the morning of your surgery.   Clear liquids allowed are: Water, Non-Citrus Juices (without pulp), Carbonated Beverages, Clear Tea, Black Coffee Only (NO MILK, CREAM OR POWDERED CREAMER of any kind), and Gatorade.     Take these medicines the morning of surgery with A SIP OF WATER:  clonazePAM (KLONOPIN)   metoprolol tartrate (LOPRESSOR)   nitrofurantoin (MACRODANTIN)   rosuvastatin (CRESTOR)   HYDROcodone-acetaminophen (NORCO) - may take if needed   Follow your surgeon's instructions on when to stop Aspirin.  If no instructions were given by your surgeon then you will need to call the office to get those instructions.     As of today, STOP taking any Aleve, Naproxen, Ibuprofen, Motrin, Advil, Goody's, BC's, all herbal medications, fish oil, and all vitamins.                     Do NOT Smoke (Tobacco/Vaping) for 24 hours prior to your procedure.  If you use a CPAP at night, you may bring your mask/headgear for your overnight stay.   Contacts, glasses, piercing's, hearing aid's, dentures or partials may not be worn into surgery, please bring cases for these belongings.    For patients admitted to the hospital, discharge time will be determined by your treatment team.   Patients discharged the day of surgery will not be allowed to drive home, and someone  needs to stay with them for 24 hours.  SURGICAL WAITING ROOM VISITATION Patients having surgery or a procedure may have no more than 2 support people in the waiting area - these visitors may rotate.   Children under the age of 69 must have an adult with them who is not the patient. If the patient needs to stay at the hospital during part of their recovery, the visitor guidelines for inpatient rooms apply. Pre-op nurse will coordinate an appropriate time for 1 support person to accompany patient in pre-op.  This support person may not rotate.   Please refer to the [facility] website for the visitor guidelines for Inpatients (after your surgery is over and you are in a regular room).    Special instructions:   [facility]- Preparing For Surgery  Before surgery, you can play an important role. Because skin is not sterile, your skin needs to be as free of germs as possible. You can reduce the number of germs on your skin by washing with CHG (chlorahexidine gluconate) Soap before surgery.  CHG is an antiseptic cleaner which kills germs and bonds with the skin to continue killing germs even after washing.    Oral Hygiene is also important to reduce your risk of infection.  Remember - BRUSH YOUR TEETH THE MORNING OF SURGERY WITH YOUR REGULAR TOOTHPASTE  Please do not use if you have an allergy to CHG or antibacterial soaps. If  your skin becomes reddened/irritated stop using the CHG.  Do not shave (including legs and underarms) for at least 48 hours prior to first CHG shower. It is OK to shave your face.  Please follow these instructions carefully.   Shower the NIGHT BEFORE SURGERY and the MORNING OF SURGERY  If you chose to wash your hair, wash your hair first as usual with your normal shampoo.  After you shampoo, rinse your hair and body thoroughly to remove the shampoo.  Use CHG Soap as you would any other liquid soap. You can apply CHG directly to the skin and wash gently with a scrungie or  a clean washcloth.   Apply the CHG Soap to your body ONLY FROM THE NECK DOWN.  Do not use on open wounds or open sores. Avoid contact with your eyes, ears, mouth and genitals (private parts). Wash Face and genitals (private parts)  with your normal soap.   Wash thoroughly, paying special attention to the area where your surgery will be performed.  Thoroughly rinse your body with warm water from the neck down.  DO NOT shower/wash with your normal soap after using and rinsing off the CHG Soap.  Pat yourself dry with a CLEAN TOWEL.  Wear CLEAN PAJAMAS to bed the night before surgery  Place CLEAN SHEETS on your bed the night before your surgery  DO NOT SLEEP WITH PETS.   Day of Surgery: Take a shower with CHG soap. Do not wear jewelry or makeup Do not wear lotions, powders, perfumes/colognes, or deodorant. Do not shave 48 hours prior to surgery.  Men may shave face and neck. Do not bring valuables to the hospital.  [facility] is not responsible for any belongings or valuables. Do not wear nail polish, gel polish, artificial nails, or any other type of covering on natural nails (fingers and toes) If you have artificial nails or gel coating that need to be removed by a nail salon, please have this removed prior to surgery. Artificial nails or gel coating may interfere with anesthesia's ability to adequately monitor your vital signs.  Wear Clean/Comfortable clothing the morning of surgery Remember to brush your teeth WITH YOUR REGULAR TOOTHPASTE.   Please read over the following fact sheets that you were given.    If you received a COVID test during your pre-op visit  it is requested that you wear a mask when out in public, stay away from anyone that may not be feeling well and notify your surgeon if you develop symptoms. If you have been in contact with anyone that has tested positive in the last 10 days please notify you surgeon.

## 2023-01-04 ENCOUNTER — Encounter (HOSPITAL_COMMUNITY): Payer: Self-pay

## 2023-01-04 ENCOUNTER — Encounter (HOSPITAL_COMMUNITY)
Admission: RE | Admit: 2023-01-04 | Discharge: 2023-01-04 | Disposition: A | Discharge status: Home / Self Care | Payer: Medicare Other | Source: Ambulatory Visit | Attending: General Surgery | Admitting: General Surgery

## 2023-01-04 ENCOUNTER — Other Ambulatory Visit: Payer: Self-pay

## 2023-01-04 VITALS — BP 161/73 | HR 80 | Temp 98.3°F | Resp 19 | Ht 60.0 in | Wt 255.0 lb

## 2023-01-04 DIAGNOSIS — I2510 Atherosclerotic heart disease of native coronary artery without angina pectoris: Secondary | ICD-10-CM | POA: Diagnosis not present

## 2023-01-04 DIAGNOSIS — R600 Localized edema: Secondary | ICD-10-CM | POA: Diagnosis not present

## 2023-01-04 DIAGNOSIS — Z853 Personal history of malignant neoplasm of breast: Secondary | ICD-10-CM | POA: Insufficient documentation

## 2023-01-04 DIAGNOSIS — Z01812 Encounter for preprocedural laboratory examination: Secondary | ICD-10-CM | POA: Insufficient documentation

## 2023-01-04 DIAGNOSIS — C792 Secondary malignant neoplasm of skin: Secondary | ICD-10-CM | POA: Insufficient documentation

## 2023-01-04 DIAGNOSIS — I4510 Unspecified right bundle-branch block: Secondary | ICD-10-CM | POA: Diagnosis not present

## 2023-01-04 HISTORY — DX: Dyspnea, unspecified: R06.00

## 2023-01-04 LAB — COMPREHENSIVE METABOLIC PANEL
ALT: 25 U/L (ref 0–44)
AST: 29 U/L (ref 15–41)
Albumin: 3.6 g/dL (ref 3.5–5.0)
Alkaline Phosphatase: 100 U/L (ref 38–126)
Anion gap: 9 (ref 5–15)
BUN: 14 mg/dL (ref 8–23)
CO2: 28 mmol/L (ref 22–32)
Calcium: 9.6 mg/dL (ref 8.9–10.3)
Chloride: 101 mmol/L (ref 98–111)
Creatinine, Ser: 0.97 mg/dL (ref 0.44–1.00)
GFR, Estimated: 59 mL/min — ABNORMAL LOW (ref >60)
Glucose, Bld: 137 mg/dL — ABNORMAL HIGH (ref 70–99)
Potassium: 3.7 mmol/L (ref 3.5–5.1)
Sodium: 138 mmol/L (ref 135–145)
Total Bilirubin: 0.4 mg/dL (ref 0.3–1.2)
Total Protein: 6.7 g/dL (ref 6.5–8.1)

## 2023-01-04 LAB — CBC
HCT: 42.7 % (ref 36.0–46.0)
Hemoglobin: 13.2 g/dL (ref 12.0–15.0)
MCH: 28.4 pg (ref 26.0–34.0)
MCHC: 30.9 g/dL (ref 30.0–36.0)
MCV: 92 fL (ref 80.0–100.0)
Platelets: 256 10*3/uL (ref 150–400)
RBC: 4.64 MIL/uL (ref 3.87–5.11)
RDW: 13.6 % (ref 11.5–15.5)
WBC: 7.9 10*3/uL (ref 4.0–10.5)
nRBC: 0 % (ref 0.0–0.2)

## 2023-01-04 NOTE — Progress Notes (Signed)
PCP - Evangeline Dakin, PA (Previous PCP Dr. Marden Noble retired. Seeing this PCP today for an acute visit) Cardiologist - Denies  PPM/ICD - Denies Device Orders - n/a Rep Notified - n/a  Chest x-ray - n/a EKG - 11/22/2022 Stress Test - Per pt, possibly had one years ago but is not sure ECHO - 08/30/2016 Cardiac Cath - Denies  Sleep Study - Per pt she has tested before it was negative for OSA. Apnea score was a 5 today  No DM  Last dose of GLP1 agonist- n/a GLP1 instructions: n/a  Blood Thinner Instructions: n/a Aspirin Instructions: Per pt, Dr. Dwain Sarna instructed pt to hold ASA for 2 days. Pt stated that she will take her last dose April 21st just to be safe.  ERAS Protcol - Clear liquids until 0430 morning of surgery PRE-SURGERY Ensure or G2- n/a  COVID TEST- n/a   Anesthesia review: Yes. Pt was SOB at rest during visit and endorse "water blisters" on her legs. RN assessed legs and Antionette Poles, PA-C assessed patient as well. She states that she does not take her fluid pill (triamterene-hydrochlorothiazide (MAXZIDE-25) everyday due to multiple appointments and doesn't want to go to the bathroom frequently. She did mention these to Dr. Dwain Sarna who is the one that set up an appointment with her PCP today at 1pm to get the fluid blisters evaluated. Pt also has a Intersim Implant for urinary frequency. She was instructed to bring both remotes for the device with her DOS.  Patient denies shortness of breath, fever, cough and chest pain at PAT appointment. Pt denies any respiratory illness/infection in the last two months.   All instructions explained to the patient, with a verbal understanding of the material. Patient agrees to go over the instructions while at home for a better understanding. Patient also instructed to self quarantine after being tested for COVID-19. The opportunity to ask questions was provided.

## 2023-01-05 ENCOUNTER — Other Ambulatory Visit: Payer: Self-pay | Admitting: General Surgery

## 2023-01-08 NOTE — Anesthesia Preprocedure Evaluation (Signed)
Anesthesia Evaluation  Patient identified by MRN, date of birth, ID band Patient awake    Reviewed: Allergy & Precautions, NPO status , Patient's Chart, lab work & pertinent test results  History of Anesthesia Complications (+) PONV and history of anesthetic complications  Airway Mallampati: IV  TM Distance: >3 FB Neck ROM: Full  Mouth opening: Limited Mouth Opening  Dental  (+) Caps, Dental Advisory Given   Pulmonary shortness of breath, neg sleep apnea, neg COPD, neg recent URI    + decreased breath sounds      Cardiovascular hypertension, Pt. on medications and Pt. on home beta blockers (-) angina (-) Past MI + dysrhythmias Atrial Fibrillation  Rhythm:Regular     Neuro/Psych  PSYCHIATRIC DISORDERS Anxiety Depression    negative neurological ROS     GI/Hepatic Neg liver ROS,GERD  ,,  Endo/Other    Morbid obesity  Renal/GU Renal diseaseLab Results      Component                Value               Date                      CREATININE               0.90                01/10/2023                Musculoskeletal  (+) Arthritis ,    Abdominal   Peds  Hematology negative hematology ROS (+) Lab Results      Component                Value               Date                      WBC                      7.9                 01/04/2023                HGB                      13.2                01/04/2023                HCT                      42.7                01/04/2023                MCV                      92.0                01/04/2023                PLT                      256                 01/04/2023  Anesthesia Other Findings   Reproductive/Obstetrics                              Anesthesia Physical Anesthesia Plan  ASA: 3  Anesthesia Plan: General   Post-op Pain Management: Tylenol PO (pre-op)*   Induction: Intravenous  PONV Risk Score and Plan: 4 or greater  and Ondansetron, Dexamethasone, Propofol infusion and TIVA  Airway Management Planned: Oral ETT and Video Laryngoscope Planned  Additional Equipment: None  Intra-op Plan:   Post-operative Plan: Extubation in OR  Informed Consent: I have reviewed the patients History and Physical, chart, labs and discussed the procedure including the risks, benefits and alternatives for the proposed anesthesia with the patient or authorized representative who has indicated his/her understanding and acceptance.     Dental advisory given  Plan Discussed with: CRNA  Anesthesia Plan Comments: (PAT note by Antionette Poles, PA-C: 82 year old female with history of left breast cancer s/p lumpectomy complicated by necrosis with full mastectomy performed on 08/02/2016 and continued open wound following surgery.  She was admitted in December 2017 for infected left mastectomy wound site and was found to be in A-fib at that time.  A-fib was felt likely related to acute infection.Echo showed moderate concentric hypertrophy, EF 55 to 60%, normal wall motion, normal valves.   In November 2022 she was discharged by oncology after completing 5 years of antiestrogen therapy.  However, she recently was seen by her dermatologist and had a shave biopsy of the skin on the left chest wall that resulted as metastatic breast cancer. She was seen by oncology and started on Faslodex.  She recently underwent left chest wall mass margin excision on 12/10/2022 without complication.  I evaluated patient at preop testing visit on 01/04/2023 for report of worsening lower extremity edema.  Patient reports history of lower extremity edema but states that it has been worse over the past month.  She also reports some mildly increased shortness of breath.  She performs minimal activity at baseline, uses motorized wheelchair for transportation.  She admits to not consistently taking her HCTZ, states she has been very busy with doctors appointments and has  not been consistently taking this medication as she was trying to reduce urinary frequency.  She also has a bladder stim implant for history of incontinence.  She has no history of heart failure or other cardiac disease.  On exam she is in no acute distress.  She does have some mild SOB even at rest. Heart regular rate and rhythm. Breath sounds clear but diminished. Bilateral lower extremities with 2+ pitting edema and a few small blisters that appear to contain serosanguineous fluid.  No evidence of cellulitis.  We discussed that she appears to be fluid overloaded which is likely causing her increased shortness of breath.  Fortunately, vitals are WNL she does not appear to be at imminent risk for decompensation.  CMP and CBC are unremarkable.  She does have an appointment scheduled later today with her PCP to be evaluated for this (of note, this was actually arranged by Dr. Dwain Sarna as patient initially reached out to him to report increased lower extremity edema).  Patient was subsequently seen by Evangeline Dakin, PA-C at [place].  She was started on Lasix 20 mg daily and a BNP was also drawn which was normal at 33.  There is no concern expressed about her ability to proceed with surgery as scheduled.  I spoke with the pt's  husband on 01/08/23 and he reported the pt is diuresing well and having symptomatic improvement. She is taking the lasix  daily as prescribed.  Given symptomatic improvement I anticipate she can proceed as planned. Will recheck BMP on DOS. I have also communicated pt's med change and symptomatic improvement to Dr. Dwain Sarna.   Pt has history of difficult intubation, glidescope has been used electively.   Preop labs reviewed, unremarkable.    EKG 11/22/2022: Normal sinus rhythm.  Rate 92. Left axis deviation. Right bundle branch block. Possible Lateral infarct , age undetermined.  No significant change since last tracing.   TTE 08/30/2016: - Left ventricle: The cavity size was  normal. There was moderate    concentric hypertrophy. Systolic function was normal. The    estimated ejection fraction was in the range of 55% to 60%. Wall    motion was normal; there were no regional wall motion    abnormalities.    )         Anesthesia Quick Evaluation

## 2023-01-08 NOTE — Progress Notes (Signed)
Anesthesia Chart Review:  82 year old female with history of left breast cancer s/p lumpectomy complicated by necrosis with full mastectomy performed on 08/02/2016 and continued open wound following surgery.  She was admitted in December 2017 for infected left mastectomy wound site and was found to be in A-fib at that time.  A-fib was felt likely related to acute infection.Echo showed moderate concentric hypertrophy, EF 55 to 60%, normal wall motion, normal valves.   In November 2022 she was discharged by oncology after completing 5 years of antiestrogen therapy.  However, she recently was seen by her dermatologist and had a shave biopsy of the skin on the left chest wall that resulted as metastatic breast cancer. She was seen by oncology and started on Faslodex.  She recently underwent left chest wall mass margin excision on 12/10/2022 without complication.  I evaluated patient at preop testing visit on 01/04/2023 for report of worsening lower extremity edema.  Patient reports history of lower extremity edema but states that it has been worse over the past month.  She also reports some mildly increased shortness of breath.  She performs minimal activity at baseline, uses motorized wheelchair for transportation.  She admits to not consistently taking her HCTZ, states she has been very busy with doctors appointments and has not been consistently taking this medication as she was trying to reduce urinary frequency.  She also has a bladder stim implant for history of incontinence.  She has no history of heart failure or other cardiac disease.  On exam she is in no acute distress.  She does have some mild SOB even at rest. Heart regular rate and rhythm. Breath sounds clear but diminished. Bilateral lower extremities with 2+ pitting edema and a few small blisters that appear to contain serosanguineous fluid.  No evidence of cellulitis.  We discussed that she appears to be fluid overloaded which is likely causing her  increased shortness of breath.  Fortunately, vitals are WNL she does not appear to be at imminent risk for decompensation.  CMP and CBC are unremarkable.  She does have an appointment scheduled later today with her PCP to be evaluated for this (of note, this was actually arranged by Dr. Dwain Sarna as patient initially reached out to him to report increased lower extremity edema).  Patient was subsequently seen by Evangeline Dakin, PA-C at [place].  She was started on Lasix 20 mg daily and a BNP was also drawn which was normal at 33.  There is no concern expressed about her ability to proceed with surgery as scheduled.  I spoke with the pt's husband on 01/08/23 and he reported the pt is diuresing well and having symptomatic improvement. She is taking the lasix  daily as prescribed.  Given symptomatic improvement I anticipate she can proceed as planned. Will recheck BMP on DOS. I have also communicated pt's med change and symptomatic improvement to Dr. Dwain Sarna.   Pt has history of difficult intubation, glidescope has been used electively.   Preop labs reviewed, unremarkable.    EKG 11/22/2022: Normal sinus rhythm.  Rate 92. Left axis deviation. Right bundle branch block. Possible Lateral infarct , age undetermined.  No significant change since last tracing.   TTE 08/30/2016: - Left ventricle: The cavity size was normal. There was moderate    concentric hypertrophy. Systolic function was normal. The    estimated ejection fraction was in the range of 55% to 60%. Wall    motion was normal; there were no regional wall motion  abnormalities.    Zannie Cove [facility] Short Stay Center/Anesthesiology Phone [phone] 01/08/2023 11:07 AM

## 2023-01-09 DIAGNOSIS — G894 Chronic pain syndrome: Secondary | ICD-10-CM | POA: Diagnosis not present

## 2023-01-09 DIAGNOSIS — E7800 Pure hypercholesterolemia, unspecified: Secondary | ICD-10-CM | POA: Diagnosis not present

## 2023-01-09 DIAGNOSIS — I10 Essential (primary) hypertension: Secondary | ICD-10-CM | POA: Diagnosis not present

## 2023-01-09 DIAGNOSIS — I4891 Unspecified atrial fibrillation: Secondary | ICD-10-CM | POA: Diagnosis not present

## 2023-01-09 DIAGNOSIS — F5101 Primary insomnia: Secondary | ICD-10-CM | POA: Diagnosis not present

## 2023-01-09 DIAGNOSIS — D649 Anemia, unspecified: Secondary | ICD-10-CM | POA: Diagnosis not present

## 2023-01-09 DIAGNOSIS — Z87440 Personal history of urinary (tract) infections: Secondary | ICD-10-CM | POA: Diagnosis not present

## 2023-01-09 DIAGNOSIS — I4510 Unspecified right bundle-branch block: Secondary | ICD-10-CM | POA: Diagnosis not present

## 2023-01-09 DIAGNOSIS — Z79891 Long term (current) use of opiate analgesic: Secondary | ICD-10-CM | POA: Diagnosis not present

## 2023-01-09 DIAGNOSIS — M549 Dorsalgia, unspecified: Secondary | ICD-10-CM | POA: Diagnosis not present

## 2023-01-09 DIAGNOSIS — Z853 Personal history of malignant neoplasm of breast: Secondary | ICD-10-CM | POA: Diagnosis not present

## 2023-01-09 DIAGNOSIS — F32A Depression, unspecified: Secondary | ICD-10-CM | POA: Diagnosis not present

## 2023-01-09 DIAGNOSIS — E559 Vitamin D deficiency, unspecified: Secondary | ICD-10-CM | POA: Diagnosis not present

## 2023-01-09 DIAGNOSIS — M150 Primary generalized (osteo)arthritis: Secondary | ICD-10-CM | POA: Diagnosis not present

## 2023-01-09 DIAGNOSIS — I872 Venous insufficiency (chronic) (peripheral): Secondary | ICD-10-CM | POA: Diagnosis not present

## 2023-01-09 DIAGNOSIS — J45909 Unspecified asthma, uncomplicated: Secondary | ICD-10-CM | POA: Diagnosis not present

## 2023-01-09 DIAGNOSIS — M109 Gout, unspecified: Secondary | ICD-10-CM | POA: Diagnosis not present

## 2023-01-09 DIAGNOSIS — Z7982 Long term (current) use of aspirin: Secondary | ICD-10-CM | POA: Diagnosis not present

## 2023-01-10 ENCOUNTER — Ambulatory Visit (HOSPITAL_COMMUNITY): Payer: Medicare Other | Admitting: Physician Assistant

## 2023-01-10 ENCOUNTER — Ambulatory Visit (HOSPITAL_COMMUNITY)
Admission: RE | Admit: 2023-01-10 | Discharge: 2023-01-10 | Disposition: A | Discharge status: Home / Self Care | Payer: Medicare Other | Attending: General Surgery | Admitting: General Surgery

## 2023-01-10 ENCOUNTER — Encounter (HOSPITAL_COMMUNITY): Payer: Self-pay | Admitting: General Surgery

## 2023-01-10 ENCOUNTER — Encounter (HOSPITAL_COMMUNITY)
Admission: RE | Disposition: A | Discharge status: Home / Self Care | Payer: Self-pay | Source: Home / Self Care | Attending: General Surgery

## 2023-01-10 ENCOUNTER — Other Ambulatory Visit: Payer: Self-pay

## 2023-01-10 ENCOUNTER — Ambulatory Visit (HOSPITAL_BASED_OUTPATIENT_CLINIC_OR_DEPARTMENT_OTHER): Payer: Medicare Other | Admitting: Physician Assistant

## 2023-01-10 DIAGNOSIS — F32A Depression, unspecified: Secondary | ICD-10-CM | POA: Diagnosis not present

## 2023-01-10 DIAGNOSIS — C50912 Malignant neoplasm of unspecified site of left female breast: Secondary | ICD-10-CM | POA: Diagnosis not present

## 2023-01-10 DIAGNOSIS — Z170 Estrogen receptor positive status [ER+]: Secondary | ICD-10-CM | POA: Diagnosis not present

## 2023-01-10 DIAGNOSIS — I4891 Unspecified atrial fibrillation: Secondary | ICD-10-CM | POA: Diagnosis not present

## 2023-01-10 DIAGNOSIS — Z6841 Body Mass Index (BMI) 40.0 and over, adult: Secondary | ICD-10-CM | POA: Diagnosis not present

## 2023-01-10 DIAGNOSIS — F419 Anxiety disorder, unspecified: Secondary | ICD-10-CM | POA: Diagnosis not present

## 2023-01-10 DIAGNOSIS — C792 Secondary malignant neoplasm of skin: Secondary | ICD-10-CM | POA: Diagnosis not present

## 2023-01-10 DIAGNOSIS — I10 Essential (primary) hypertension: Secondary | ICD-10-CM

## 2023-01-10 DIAGNOSIS — C7981 Secondary malignant neoplasm of breast: Secondary | ICD-10-CM | POA: Diagnosis not present

## 2023-01-10 DIAGNOSIS — I129 Hypertensive chronic kidney disease with stage 1 through stage 4 chronic kidney disease, or unspecified chronic kidney disease: Secondary | ICD-10-CM | POA: Insufficient documentation

## 2023-01-10 DIAGNOSIS — M1990 Unspecified osteoarthritis, unspecified site: Secondary | ICD-10-CM | POA: Diagnosis not present

## 2023-01-10 DIAGNOSIS — N189 Chronic kidney disease, unspecified: Secondary | ICD-10-CM | POA: Diagnosis not present

## 2023-01-10 HISTORY — PX: RE-EXCISION OF BREAST CANCER,SUPERIOR MARGINS: SHX6047

## 2023-01-10 LAB — BASIC METABOLIC PANEL
Anion gap: 13 (ref 5–15)
BUN: 22 mg/dL (ref 8–23)
CO2: 28 mmol/L (ref 22–32)
Calcium: 10 mg/dL (ref 8.9–10.3)
Chloride: 98 mmol/L (ref 98–111)
Creatinine, Ser: 0.9 mg/dL (ref 0.44–1.00)
GFR, Estimated: >60 mL/min (ref >60)
Glucose, Bld: 163 mg/dL — ABNORMAL HIGH (ref 70–99)
Potassium: 3.5 mmol/L (ref 3.5–5.1)
Sodium: 139 mmol/L (ref 135–145)

## 2023-01-10 SURGERY — RE-EXCISION OF BREAST CANCER,SUPERIOR MARGINS
Anesthesia: General | Laterality: Left

## 2023-01-10 MED ORDER — CHLORHEXIDINE GLUCONATE CLOTH 2 % EX PADS: 6.0000 | MEDICATED_PAD | Freq: Once | CUTANEOUS | Status: DC

## 2023-01-10 MED ORDER — LIDOCAINE 2% (20 MG/ML) 5 ML SYRINGE
INTRAMUSCULAR | Status: AC
  Filled 2023-01-10: qty 5

## 2023-01-10 MED ORDER — ACETAMINOPHEN 160 MG/5ML PO SOLN: 1000.0000 mg | Freq: Once | ORAL | Status: DC | PRN

## 2023-01-10 MED ORDER — ENSURE PRE-SURGERY PO LIQD: 296.0000 mL | Freq: Once | ORAL | Status: DC

## 2023-01-10 MED ORDER — OXYCODONE HCL 5 MG/5ML PO SOLN: 5.0000 mg | Freq: Once | ORAL | Status: AC | PRN

## 2023-01-10 MED ORDER — FENTANYL CITRATE (PF) 100 MCG/2ML IJ SOLN
25.0000 ug | INTRAMUSCULAR | Status: DC | PRN
  Administered 2023-01-10: 50 ug via INTRAVENOUS

## 2023-01-10 MED ORDER — OXYCODONE HCL 5 MG PO TABS
5.0000 mg | ORAL_TABLET | Freq: Once | ORAL | Status: AC | PRN
  Administered 2023-01-10: 5 mg via ORAL

## 2023-01-10 MED ORDER — ORAL CARE MOUTH RINSE: 15.0000 mL | Freq: Once | OROMUCOSAL | Status: AC

## 2023-01-10 MED ORDER — ACETAMINOPHEN 500 MG PO TABS: 1000.0000 mg | ORAL_TABLET | Freq: Once | ORAL | Status: DC | PRN

## 2023-01-10 MED ORDER — ACETAMINOPHEN 500 MG PO TABS
1000.0000 mg | ORAL_TABLET | ORAL | Status: DC
  Filled 2023-01-10: qty 2

## 2023-01-10 MED ORDER — ACETAMINOPHEN 10 MG/ML IV SOLN: 1000.0000 mg | Freq: Once | INTRAVENOUS | Status: DC | PRN

## 2023-01-10 MED ORDER — LACTATED RINGERS IV SOLN: INTRAVENOUS | Status: DC | PRN

## 2023-01-10 MED ORDER — OXYCODONE HCL 5 MG PO TABS
ORAL_TABLET | ORAL | Status: AC
  Filled 2023-01-10: qty 1

## 2023-01-10 MED ORDER — ONDANSETRON HCL 4 MG/2ML IJ SOLN
INTRAMUSCULAR | Status: AC
  Filled 2023-01-10: qty 2

## 2023-01-10 MED ORDER — FENTANYL CITRATE (PF) 250 MCG/5ML IJ SOLN
INTRAMUSCULAR | Status: AC
  Filled 2023-01-10: qty 5

## 2023-01-10 MED ORDER — CEFAZOLIN SODIUM-DEXTROSE 2-4 GM/100ML-% IV SOLN
2.0000 g | INTRAVENOUS | Status: AC
  Administered 2023-01-10: 2 g via INTRAVENOUS
  Filled 2023-01-10: qty 100

## 2023-01-10 MED ORDER — ROCURONIUM BROMIDE 10 MG/ML (PF) SYRINGE
PREFILLED_SYRINGE | INTRAVENOUS | Status: DC | PRN
  Administered 2023-01-10: 80 mg via INTRAVENOUS

## 2023-01-10 MED ORDER — ACETAMINOPHEN 500 MG PO TABS
500.0000 mg | ORAL_TABLET | Freq: Once | ORAL | Status: AC
  Administered 2023-01-10: 500 mg via ORAL

## 2023-01-10 MED ORDER — DEXAMETHASONE SODIUM PHOSPHATE 10 MG/ML IJ SOLN
INTRAMUSCULAR | Status: DC | PRN
  Administered 2023-01-10: 10 mg via INTRAVENOUS

## 2023-01-10 MED ORDER — PROPOFOL 500 MG/50ML IV EMUL
INTRAVENOUS | Status: DC | PRN
  Administered 2023-01-10: 100 ug/kg/min via INTRAVENOUS

## 2023-01-10 MED ORDER — BACITRACIN ZINC 500 UNIT/GM EX OINT
TOPICAL_OINTMENT | CUTANEOUS | Status: AC
  Filled 2023-01-10: qty 28.35

## 2023-01-10 MED ORDER — LACTATED RINGERS IV SOLN: INTRAVENOUS | Status: DC

## 2023-01-10 MED ORDER — FENTANYL CITRATE (PF) 250 MCG/5ML IJ SOLN
INTRAMUSCULAR | Status: DC | PRN
  Administered 2023-01-10: 125 ug via INTRAVENOUS

## 2023-01-10 MED ORDER — ONDANSETRON HCL 4 MG/2ML IJ SOLN
INTRAMUSCULAR | Status: DC | PRN
  Administered 2023-01-10: 4 mg via INTRAVENOUS

## 2023-01-10 MED ORDER — PROPOFOL 10 MG/ML IV BOLUS
INTRAVENOUS | Status: DC | PRN
  Administered 2023-01-10: 140 mg via INTRAVENOUS
  Administered 2023-01-10: 20 mg via INTRAVENOUS

## 2023-01-10 MED ORDER — SUGAMMADEX SODIUM 200 MG/2ML IV SOLN
INTRAVENOUS | Status: DC | PRN
  Administered 2023-01-10: 300 mg via INTRAVENOUS

## 2023-01-10 MED ORDER — CHLORHEXIDINE GLUCONATE 0.12 % MT SOLN
15.0000 mL | Freq: Once | OROMUCOSAL | Status: AC
  Administered 2023-01-10: 15 mL via OROMUCOSAL
  Filled 2023-01-10: qty 15

## 2023-01-10 MED ORDER — FENTANYL CITRATE (PF) 100 MCG/2ML IJ SOLN
INTRAMUSCULAR | Status: AC
  Filled 2023-01-10: qty 2

## 2023-01-10 MED ORDER — LIDOCAINE 2% (20 MG/ML) 5 ML SYRINGE
INTRAMUSCULAR | Status: DC | PRN
  Administered 2023-01-10: 60 mg via INTRAVENOUS

## 2023-01-10 MED ORDER — BUPIVACAINE-EPINEPHRINE (PF) 0.5% -1:200000 IJ SOLN
INTRAMUSCULAR | Status: AC
  Filled 2023-01-10: qty 30

## 2023-01-10 MED ORDER — PROPOFOL 10 MG/ML IV BOLUS
INTRAVENOUS | Status: AC
  Filled 2023-01-10: qty 20

## 2023-01-10 SURGICAL SUPPLY — 45 items
ADH SKN CLS APL DERMABOND .7 (GAUZE/BANDAGES/DRESSINGS) ×1
APL PRP STRL LF DISP 70% ISPRP (MISCELLANEOUS) ×1
APPLIER CLIP 9.375 MED OPEN (MISCELLANEOUS)
APR CLP MED 9.3 20 MLT OPN (MISCELLANEOUS)
BAG COUNTER SPONGE SURGICOUNT (BAG) ×1 IMPLANT
BAG SPNG CNTER NS LX DISP (BAG) ×1
BINDER BREAST LRG (GAUZE/BANDAGES/DRESSINGS) IMPLANT
BINDER BREAST XLRG (GAUZE/BANDAGES/DRESSINGS) IMPLANT
CANISTER SUCT 3000ML PPV (MISCELLANEOUS) ×1 IMPLANT
CHLORAPREP W/TINT 26 (MISCELLANEOUS) ×1 IMPLANT
CLIP APPLIE 9.375 MED OPEN (MISCELLANEOUS) IMPLANT
COVER SURGICAL LIGHT HANDLE (MISCELLANEOUS) ×1 IMPLANT
DERMABOND ADVANCED .7 DNX12 (GAUZE/BANDAGES/DRESSINGS) ×1 IMPLANT
DRAPE CHEST BREAST 15X10 FENES (DRAPES) ×1 IMPLANT
ELECT CAUTERY BLADE 6.4 (BLADE) ×1 IMPLANT
ELECT REM PT RETURN 9FT ADLT (ELECTROSURGICAL) ×1
ELECTRODE REM PT RTRN 9FT ADLT (ELECTROSURGICAL) ×1 IMPLANT
GAUZE SPONGE 4X4 12PLY STRL (GAUZE/BANDAGES/DRESSINGS) IMPLANT
GLOVE BIO SURGEON STRL SZ7 (GLOVE) ×1 IMPLANT
GLOVE BIOGEL PI IND STRL 7.5 (GLOVE) ×1 IMPLANT
GOWN STRL REUS W/ TWL LRG LVL3 (GOWN DISPOSABLE) ×2 IMPLANT
GOWN STRL REUS W/TWL LRG LVL3 (GOWN DISPOSABLE) ×2
HEMOSTAT ARISTA ABSORB 3G PWDR (HEMOSTASIS) IMPLANT
KIT BASIN OR (CUSTOM PROCEDURE TRAY) ×1 IMPLANT
KIT TURNOVER KIT B (KITS) ×1 IMPLANT
NDL HYPO 25GX1X1/2 BEV (NEEDLE) ×1 IMPLANT
NEEDLE HYPO 25GX1X1/2 BEV (NEEDLE) ×1 IMPLANT
NS IRRIG 1000ML POUR BTL (IV SOLUTION) ×1 IMPLANT
PACK GENERAL/GYN (CUSTOM PROCEDURE TRAY) ×1 IMPLANT
PAD ARMBOARD 7.5X6 YLW CONV (MISCELLANEOUS) ×1 IMPLANT
PENCIL SMOKE EVACUATOR (MISCELLANEOUS) IMPLANT
SPIKE FLUID TRANSFER (MISCELLANEOUS) ×1 IMPLANT
SUT ETHILON 2 0 FS 18 (SUTURE) IMPLANT
SUT MNCRL AB 4-0 PS2 18 (SUTURE) IMPLANT
SUT MON AB 5-0 PS2 18 (SUTURE) IMPLANT
SUT SILK 2 0 SH (SUTURE) IMPLANT
SUT VIC AB 2-0 SH 27 (SUTURE) ×1
SUT VIC AB 2-0 SH 27XBRD (SUTURE) ×1 IMPLANT
SUT VIC AB 3-0 SH 27 (SUTURE) ×1
SUT VIC AB 3-0 SH 27X BRD (SUTURE) ×1 IMPLANT
SUT VIC AB 3-0 SH 8-18 (SUTURE) IMPLANT
SYR 20CC LL (SYRINGE) IMPLANT
SYR CONTROL 10ML LL (SYRINGE) ×1 IMPLANT
TOWEL GREEN STERILE (TOWEL DISPOSABLE) ×1 IMPLANT
TOWEL GREEN STERILE FF (TOWEL DISPOSABLE) ×1 IMPLANT

## 2023-01-10 NOTE — H&P (Signed)
Debra Griffith is an 82 y.o. female.   Chief Complaint: recurrent breast cancer HPI: 82 year old female who I know well from prior surgery. She underwent an initial lumpectomy as well as sentinel lymph node biopsy in 2017. This was followed by oncoplastic breast reconstruction and the left side had significant fat necrosis and wound issues so she we then proceeded and did a left mastectomy due to the nonhealing wound and fat necrosis. Her initial pathology was an invasive ductal carcinoma with 1 negative sentinel lymph node and ITC's and an additional lymph node. This tumor was 90% ER and PR positive, HER2 negative, with a low Ki-67. She was then treated with anastrozole for 5 years. She has been doing okay. She had an area on her chest that she was concerned about she underwent a shave biopsy at her dermatologist. This shows this to be metastatic breast adenocarcinoma extending to the edge and the base of the shave biopsy. This is greater than 95% ER positive, 80% PR positive, HER2 is negative. She is mostly confined to a scooter and is not able we will really ambulate at this point. She is here with her husband and her daughter today. Pet is negative. I have excised twice with a couple positive margins.     Past Medical History:  Diagnosis Date   Anemia    hx of    Anxiety    Breast cancer in female 03/2016   left   Chronic bronchitis    FLARE UPS USUALLY ONCE A YEAR   Chronic lower back pain    Complication of anesthesia    trouble waking up    CRI (chronic renal insufficiency) 07/27/2016   DDD (degenerative disc disease), lumbar    Depression    Dyspnea    History of recent fall    "twice on Sunday; once yesterday" (08/29/2016)   Hypertension    Inflammatory arthritis 07/27/2016   Sero Negative, Positive Synovitis hands, WJ   Insomnia    Irregular heart beats    Macular degeneration of right eye    New onset atrial fibrillation    OA (osteoarthritis) of hip 06/24/2012   OA  (osteoarthritis) of knee 11/02/2013   L knee    Obesity    Osteoarthritis of both feet 07/27/2016   Osteoarthritis of both hands 07/27/2016   PONV (postoperative nausea and vomiting)    SEVERE N&V AND DIARRHEA AFTER HYSTERECTOMY AND AFTER WISDOM TEETH EXTRACTIONS - NO PROBLEMS WITH LAST 2 SURGERIES - THE HIP AND LEFT KNEE   Postop Acute blood loss anemia 06/25/2012   Postop Hyponatremia 06/25/2012   Postop Sinus tachycardia 06/27/2012   RBBB (right bundle branch block) 07/27/2016   Rheumatoid arthritis    "doctor recently took me off all RX for this" (08/02/2016)   Right bundle branch block    Right bundle branch block    S/p dental crown    dental crowns on every tooth   Status post total bilateral knee replacement 11/13/2013   Vitamin D deficiency     Past Surgical History:  Procedure Laterality Date   AXILLARY SURGERY Left 06/25/2016   Aspiration of left axillary seroma    BREAST BIOPSY Left 03/2016   BREAST CYST EXCISION Left 11/26/2022   Procedure: LEFT CHEST WALL MASS EXCISION;  Surgeon: [name], MD;  Location: [facility] OR;  Service: General;  Laterality: Left;   BREAST LUMPECTOMY WITH RADIOACTIVE SEED AND SENTINEL LYMPH NODE BIOPSY Left 06/13/2016   Procedure: LEFT BREAST LUMPECTOMY WITH  BRACKETED  RADIOACTIVE SEED AND SENTINEL LYMPH NODE BIOPSY;  Surgeon: [name], MD;  Location: [facility] OR;  Service: General;  Laterality: Left;   BREAST RECONSTRUCTION Bilateral 06/25/2016   BILATERAL ONCOPLASTIC BREAST RECONSTRUCTION WITH BREAST REDUCTION   BREAST RECONSTRUCTION WITH PLACEMENT OF TISSUE EXPANDER AND FLEX HD (ACELLULAR HYDRATED DERMIS) Bilateral 06/25/2016   Procedure: BILATERAL ONCOPLASTIC BREAST RECONSTRUCTION WITH BREAST REDUCTION, Aspiration of left axillary seroma;  Surgeon: Glenna Fellows, MD;  Location: [facility] OR;  Service: Plastics;  Laterality: Bilateral;   BREAST REDUCTION SURGERY Bilateral 06/25/2016   Procedure: MAMMARY REDUCTION  (BREAST) BILATERAL;   Surgeon: Glenna Fellows, MD;  Location: [facility] OR;  Service: Plastics;  Laterality: Bilateral;   CESAREAN SECTION  1966; 1971; 1973   COLONOSCOPY     DEBRIDEMENT AND CLOSURE WOUND Right 07/11/2016   Procedure: DEBRIDEMENT LEFT BREAST;  Surgeon: Glenna Fellows, MD;  Location: [facility] OR;  Service: Plastics;  Laterality: Right;   EYE SURGERY     INTERSTIM IMPLANT PLACEMENT N/A 08/29/2021   Procedure: Leane Platt IMPLANT FIRST STAGE;  Surgeon: Alfredo Martinez, MD;  Location: [facility] ORS;  Service: Urology;  Laterality: N/A;   INTERSTIM IMPLANT PLACEMENT N/A 08/29/2021   Procedure: Leane Platt IMPLANT SECOND STAGE AND IMPEDANCE CHECK;  Surgeon: Alfredo Martinez, MD;  Location: [facility] ORS;  Service: Urology;  Laterality: N/A;   JOINT REPLACEMENT     KNEE ARTHROSCOPY Right    "before replacement"   MASS EXCISION Left 12/10/2022   Procedure: LEFT CHEST WALL MASS MARGIN EXCISION;  Surgeon: [name], MD;  Location: [facility] ORS;  Service: General;  Laterality: Left;   MASTECTOMY COMPLETE / SIMPLE Left 08/02/2016   total   REDUCTION MAMMAPLASTY Bilateral 06/25/2016   TOTAL ABDOMINAL HYSTERECTOMY W/ BILATERAL SALPINGOOPHORECTOMY     TOTAL HIP ARTHROPLASTY  06/24/2012   Procedure: TOTAL HIP ARTHROPLASTY;  Surgeon: Loanne Drilling, MD;  Location: [facility] ORS;  Service: Orthopedics;  Laterality: Left;   TOTAL KNEE ARTHROPLASTY N/A 11/02/2013   Procedure: LEFT TOTAL KNEE ARTHROPLASTY WITH RIGHT KNEE CORTISONE INJECTION;  Surgeon: Loanne Drilling, MD;  Location: [facility] ORS;  Service: Orthopedics;  Laterality: N/A;   TOTAL KNEE ARTHROPLASTY Right 05/31/2014   Procedure: RIGHT TOTAL KNEE ARTHROPLASTY;  Surgeon: Loanne Drilling, MD;  Location: [facility] ORS;  Service: Orthopedics;  Laterality: Right;   TOTAL MASTECTOMY Left 08/02/2016   Procedure: LEFT TOTAL MASTECTOMY;  Surgeon: [name], MD;  Location: [facility] OR;  Service: General;  Laterality: Left;   TUBAL LIGATION  1973   WISDOM TOOTH EXTRACTION  1970's   admitted to  hospital for surgery    Family History  Problem Relation Age of Onset   Hypertension Mother    Prostate cancer Father    Hypertension Father    Heart Problems Father        quadruple bypass   Hypertension Sister    Social History:  reports that she has never smoked. She has never used smokeless tobacco. She reports that she does not currently use drugs. She reports that she does not drink alcohol.  Allergies:  Allergies  Allergen Reactions   Morphine And Related Other (See Comments)    hallucinations   Other Swelling and Other (See Comments)    sterling silver - swelling, pain, infection     Medications Prior to Admission  Medication Sig Dispense Refill   aspirin EC 81 MG tablet Take 81 mg by mouth daily. Swallow whole.     Calcium Carbonate Antacid (TUMS PO) Take 1 tablet by mouth as  needed (heartburn).     Cholecalciferol (VITAMIN D) 125 MCG (5000 UT) CAPS Take 5,000 Units by mouth daily.     clonazePAM (KLONOPIN) 1 MG tablet Take 1 mg by mouth daily.     cyclobenzaprine (FLEXERIL) 10 MG tablet Take 20 mg by mouth at bedtime.     ferrous sulfate 325 (65 FE) MG EC tablet Take 325 mg by mouth daily.     furosemide (LASIX) 20 MG tablet Take 20 mg by mouth daily.     HYDROcodone-acetaminophen (NORCO) 10-325 MG tablet Take 1 tablet by mouth 5 (five) times daily as needed for moderate pain.     metoprolol tartrate (LOPRESSOR) 25 MG tablet Take 25 mg by mouth 2 (two) times daily.      Multiple Vitamins-Minerals (PRESERVISION AREDS 2+MULTI VIT PO) Take 1 capsule by mouth in the morning and at bedtime.     nitrofurantoin (MACRODANTIN) 100 MG capsule Take 1 capsule (100 mg total) by mouth at bedtime. (Patient taking differently: Take 100 mg by mouth daily.)     OVER THE COUNTER MEDICATION Take 1 tablet by mouth daily. Bone up     OVER THE COUNTER MEDICATION Take 2 tablets by mouth daily as needed (constipation). [employer]     rosuvastatin (CRESTOR) 5 MG tablet Take 5 mg by mouth  daily.     triamterene-hydrochlorothiazide (MAXZIDE-25) 37.5-25 MG tablet Take 1 tablet by mouth daily.     valsartan (DIOVAN) 320 MG tablet Take 320 mg by mouth daily.     zolpidem (AMBIEN) 10 MG tablet Take 10 mg by mouth at bedtime.     venlafaxine XR (EFFEXOR-XR) 75 MG 24 hr capsule Take 1 capsule (75 mg total) by mouth 2 (two) times daily. (Patient not taking: Reported on 01/03/2023) 180 capsule 4    Results for orders placed or performed during the hospital encounter of 01/10/23 (from the past 48 hour(s))  Basic metabolic panel     Status: Abnormal   Collection Time: 01/10/23  6:20 AM  Result Value Ref Range   Sodium 139 135 - 145 mmol/L   Potassium 3.5 3.5 - 5.1 mmol/L   Chloride 98 98 - 111 mmol/L   CO2 28 22 - 32 mmol/L   Glucose, Bld 163 (H) 70 - 99 mg/dL    Comment: Glucose reference range applies only to samples taken after fasting for at least 8 hours.   BUN 22 8 - 23 mg/dL   Creatinine, Ser 1.61 0.44 - 1.00 mg/dL   Calcium 09.6 8.9 - 04.5 mg/dL   GFR, Estimated >40 >98 mL/min    Comment: (NOTE) Calculated using the CKD-EPI Creatinine Equation (2021)    Anion gap 13 5 - 15    Comment: Performed at [facility], [address], [place], [place] [address]   No results found.  Review of Systems Leg swelling  Blood pressure (!) 161/70, pulse 89, temperature 97.8 F (36.6 C), temperature source Oral, resp. rate 18, height 5' (1.524 m), weight 115.7 kg, SpO2 95 %. Physical Exam  Objective:   Physical Exam Scar well healed, no infection Cv regular  Pulm effort normal  Assessment/Plan Assessment and Plan:   Chest wall recurrence of left breast cancer (CMS-HCC)  Re-excision of left chest wall breast cancer  [name], MD 01/10/2023, 7:05 AM

## 2023-01-10 NOTE — Op Note (Signed)
Preoperative diagnosis: recurrent breast cancer, chest wall, s/p excision Postoperative diagnosis: same as above Procedure:Re-excision of left chest wall margins for recurrent breast cancer, 10x8x2 cm Surgeon: Dr Harden Mo Anes: general EBL: minimal Complications none Drains none Specimens left medial chest wall re-excision short superior, long lateral Sponge and needle count correct Dispo recovery stable   Indications: 82 year old female who I know well from prior surgery. She underwent an initial lumpectomy as well as sentinel lymph node biopsy in 2017. This was followed by oncoplastic breast reconstruction and the left side had significant fat necrosis and wound issues so she we then proceeded and did a left mastectomy due to the nonhealing wound and fat necrosis. Her initial pathology was an invasive ductal carcinoma with 1 negative sentinel lymph node and ITC's and an additional lymph node. This tumor was 90% ER and PR positive, HER2 negative, with a low Ki-67. She was then treated with anastrozole for 5 years.  She had an area on her chest that she was concerned about she underwent a shave biopsy at her dermatologist. This shows this to be metastatic breast adenocarcinoma extending to the edge and the base of the shave biopsy. This is greater than 95% ER positive, 80% PR positive, HER2 is negative. I have excised this twice and still has positive margins. Discussed returning again.   Procedure: After informed consent was obtained she was taken to the operating room.   She was given antibiotics.  SCDs were placed.  She was placed under general anesthesia without complication.  She was prepped and draped in sterile sterile surgical fashion.  Surgical timeout was performed.   I imade an incision and re-excised the entire scar and all the margins again with attention to those that were close.    I marked this as above. I obtained hemostasis. This tissue was friable so I placed some Arista.  I  then closed it with 3-0 vicryl for deep dermals. The skin was closed with multiple 2-0 nylon sutures She tolerated this well was extubated transferred recovery stable.

## 2023-01-10 NOTE — Interval H&P Note (Signed)
History and Physical Interval Note:  01/10/2023 7:11 AM  Debra Griffith  has presented today for surgery, with the diagnosis of LEFT CHEST WALL BREAST CANCER RECURRENCE.  The various methods of treatment have been discussed with the patient and family. After consideration of risks, benefits and other options for treatment, the patient has consented to  Procedure(s): RE-EXCISION OF LEFT CHEST WALL BREAST CANCER (Left) as a surgical intervention.  The patient's history has been reviewed, patient examined, no change in status, stable for surgery.  I have reviewed the patient's chart and labs.  Questions were answered to the patient's satisfaction.     [name]

## 2023-01-10 NOTE — Progress Notes (Signed)
50 mcg fentanyl wasted in stericycle with Hassan Rowan, RN.

## 2023-01-10 NOTE — Discharge Instructions (Signed)
[facility] Office Phone Number [phone]  POST OP INSTRUCTIONS Take 400 mg of ibuprofen every 8 hours or 650 mg tylenol every 6 hours for next 72 hours then as needed. Use ice several times daily also.  Take your usually prescribed medications unless otherwise directed You should eat very light the first 24 hours after surgery, such as soup, crackers, pudding, etc.  Resume your normal diet the day after surgery. Most patients will experience some swelling and bruising.  Ice packs will help. Swelling and bruising can take several days to resolve.  It is common to experience some constipation if taking pain medication after surgery.  Increasing fluid intake and taking a stool softener will usually help or prevent this problem from occurring.  A mild laxative (Milk of Magnesia or Miralax) should be taken according to package directions if there are no bowel movements after 48 hours. I used skin glue on the incision, you may shower in 24 hours.  The glue will flake off over the next 2-3 weeks.  Any sutures or staples will be removed at the office during your follow-up visit. ACTIVITIES:  You may resume regular daily activities (gradually increasing) beginning the next day.   You may drive when you no longer are taking prescription pain medication, you can comfortably wear a seatbelt, and you can safely maneuver your car and apply brakes. RETURN TO WORK:  ______________________________________________________________________________________ Debra Griffith should see your doctor in the office for a follow-up appointment approximately two weeks after your surgery.  Your doctor's nurse will typically make your follow-up appointment when she calls you with your pathology report.  Expect your pathology report 3-4 business days after your surgery.  You may call to check if you do not hear from [place] after three days. OTHER INSTRUCTIONS:  _______________________________________________________________________________________________ _____________________________________________________________________________________________________________________________________ _____________________________________________________________________________________________________________________________________ _____________________________________________________________________________________________________________________________________  WHEN TO CALL DR Vernon Ariel: Fever over 101.0 Nausea and/or vomiting. Extreme swelling or bruising. Continued bleeding from incision. Increased pain, redness, or drainage from the incision.  The clinic staff is available to answer your questions during regular business hours.  Please don't hesitate to call and ask to speak to one of the nurses for clinical concerns.  If you have a medical emergency, go to the nearest emergency room or call 911.  A surgeon from [facility] is always on call at the hospital.  For further questions, please visit [email or web address]

## 2023-01-10 NOTE — Anesthesia Procedure Notes (Signed)
Procedure Name: Intubation Date/Time: 01/10/2023 7:44 AM  Performed by: Nils Pyle, CRNAPre-anesthesia Checklist: Patient identified, Emergency Drugs available, Suction available and Patient being monitored Patient Re-evaluated:Patient Re-evaluated prior to induction Oxygen Delivery Method: Circle System Utilized Preoxygenation: Pre-oxygenation with 100% oxygen Induction Type: IV induction Ventilation: Mask ventilation without difficulty Laryngoscope Size: Glidescope and 3 Grade View: Grade I Tube type: Oral Tube size: 7.0 mm Number of attempts: 1 Airway Equipment and Method: Stylet and Oral airway Placement Confirmation: ETT inserted through vocal cords under direct vision, positive ETCO2 and breath sounds checked- equal and bilateral Secured at: 22 cm Tube secured with: Tape Dental Injury: Teeth and Oropharynx as per pre-operative assessment

## 2023-01-10 NOTE — Transfer of Care (Signed)
Immediate Anesthesia Transfer of Care Note  Patient: Debra Griffith  Procedure(s) Performed: RE-EXCISION OF LEFT CHEST WALL BREAST CANCER (Left)  Patient Location: PACU  Anesthesia Type:General  Level of Consciousness: awake, alert , and oriented  Airway & Oxygen Therapy: Patient Spontanous Breathing  Post-op Assessment: Report given to RN, Post -op Vital signs reviewed and stable, and Patient moving all extremities X 4  Post vital signs: Reviewed and stable  Last Vitals:  Vitals Value Taken Time  BP 157/67 01/10/23 0822  Temp    Pulse 83 01/10/23 0822  Resp    SpO2 93 % 01/10/23 0822  Vitals shown include unvalidated device data.  Last Pain:  Vitals:   01/10/23 0624  TempSrc:   PainSc: 0-No pain         Complications: No notable events documented.

## 2023-01-11 ENCOUNTER — Encounter (HOSPITAL_COMMUNITY): Payer: Self-pay | Admitting: General Surgery

## 2023-01-11 LAB — SURGICAL PATHOLOGY

## 2023-01-14 DIAGNOSIS — F32A Depression, unspecified: Secondary | ICD-10-CM | POA: Diagnosis not present

## 2023-01-14 DIAGNOSIS — I10 Essential (primary) hypertension: Secondary | ICD-10-CM | POA: Diagnosis not present

## 2023-01-14 DIAGNOSIS — F5101 Primary insomnia: Secondary | ICD-10-CM | POA: Diagnosis not present

## 2023-01-14 DIAGNOSIS — I872 Venous insufficiency (chronic) (peripheral): Secondary | ICD-10-CM | POA: Diagnosis not present

## 2023-01-14 DIAGNOSIS — I4510 Unspecified right bundle-branch block: Secondary | ICD-10-CM | POA: Diagnosis not present

## 2023-01-14 DIAGNOSIS — M150 Primary generalized (osteo)arthritis: Secondary | ICD-10-CM | POA: Diagnosis not present

## 2023-01-14 DIAGNOSIS — I4891 Unspecified atrial fibrillation: Secondary | ICD-10-CM | POA: Diagnosis not present

## 2023-01-14 DIAGNOSIS — E559 Vitamin D deficiency, unspecified: Secondary | ICD-10-CM | POA: Diagnosis not present

## 2023-01-14 DIAGNOSIS — D649 Anemia, unspecified: Secondary | ICD-10-CM | POA: Diagnosis not present

## 2023-01-14 DIAGNOSIS — Z7982 Long term (current) use of aspirin: Secondary | ICD-10-CM | POA: Diagnosis not present

## 2023-01-14 DIAGNOSIS — M109 Gout, unspecified: Secondary | ICD-10-CM | POA: Diagnosis not present

## 2023-01-14 DIAGNOSIS — J45909 Unspecified asthma, uncomplicated: Secondary | ICD-10-CM | POA: Diagnosis not present

## 2023-01-14 DIAGNOSIS — Z79891 Long term (current) use of opiate analgesic: Secondary | ICD-10-CM | POA: Diagnosis not present

## 2023-01-14 DIAGNOSIS — G894 Chronic pain syndrome: Secondary | ICD-10-CM | POA: Diagnosis not present

## 2023-01-14 DIAGNOSIS — E7800 Pure hypercholesterolemia, unspecified: Secondary | ICD-10-CM | POA: Diagnosis not present

## 2023-01-14 DIAGNOSIS — Z87440 Personal history of urinary (tract) infections: Secondary | ICD-10-CM | POA: Diagnosis not present

## 2023-01-14 DIAGNOSIS — M549 Dorsalgia, unspecified: Secondary | ICD-10-CM | POA: Diagnosis not present

## 2023-01-14 DIAGNOSIS — Z853 Personal history of malignant neoplasm of breast: Secondary | ICD-10-CM | POA: Diagnosis not present

## 2023-01-14 NOTE — Anesthesia Postprocedure Evaluation (Signed)
Anesthesia Post Note  Patient: Debra Griffith  Procedure(s) Performed: RE-EXCISION OF LEFT CHEST WALL BREAST CANCER (Left)     Patient location during evaluation: PACU Anesthesia Type: General Level of consciousness: awake and alert Pain management: pain level controlled Vital Signs Assessment: post-procedure vital signs reviewed and stable Respiratory status: spontaneous breathing, nonlabored ventilation and respiratory function stable Cardiovascular status: blood pressure returned to baseline and stable Postop Assessment: no apparent nausea or vomiting Anesthetic complications: no   No notable events documented.  Last Vitals:  Vitals:   01/10/23 0837 01/10/23 0852  BP: (!) 175/72 (!) 157/76  Pulse: 82 83  Resp: 12 14  Temp:  37.1 C  SpO2: 99% 99%    Last Pain:  Vitals:   01/10/23 0852  TempSrc:   PainSc: 4                  Augustino Savastano

## 2023-01-16 ENCOUNTER — Telehealth: Payer: Self-pay | Admitting: Radiation Oncology

## 2023-01-16 DIAGNOSIS — I872 Venous insufficiency (chronic) (peripheral): Secondary | ICD-10-CM | POA: Diagnosis not present

## 2023-01-16 DIAGNOSIS — D649 Anemia, unspecified: Secondary | ICD-10-CM | POA: Diagnosis not present

## 2023-01-16 DIAGNOSIS — E559 Vitamin D deficiency, unspecified: Secondary | ICD-10-CM | POA: Diagnosis not present

## 2023-01-16 DIAGNOSIS — M549 Dorsalgia, unspecified: Secondary | ICD-10-CM | POA: Diagnosis not present

## 2023-01-16 DIAGNOSIS — M109 Gout, unspecified: Secondary | ICD-10-CM | POA: Diagnosis not present

## 2023-01-16 DIAGNOSIS — I10 Essential (primary) hypertension: Secondary | ICD-10-CM | POA: Diagnosis not present

## 2023-01-16 DIAGNOSIS — Z79891 Long term (current) use of opiate analgesic: Secondary | ICD-10-CM | POA: Diagnosis not present

## 2023-01-16 DIAGNOSIS — Z853 Personal history of malignant neoplasm of breast: Secondary | ICD-10-CM | POA: Diagnosis not present

## 2023-01-16 DIAGNOSIS — J45909 Unspecified asthma, uncomplicated: Secondary | ICD-10-CM | POA: Diagnosis not present

## 2023-01-16 DIAGNOSIS — F32A Depression, unspecified: Secondary | ICD-10-CM | POA: Diagnosis not present

## 2023-01-16 DIAGNOSIS — Z87440 Personal history of urinary (tract) infections: Secondary | ICD-10-CM | POA: Diagnosis not present

## 2023-01-16 DIAGNOSIS — E7800 Pure hypercholesterolemia, unspecified: Secondary | ICD-10-CM | POA: Diagnosis not present

## 2023-01-16 DIAGNOSIS — F5101 Primary insomnia: Secondary | ICD-10-CM | POA: Diagnosis not present

## 2023-01-16 DIAGNOSIS — M150 Primary generalized (osteo)arthritis: Secondary | ICD-10-CM | POA: Diagnosis not present

## 2023-01-16 DIAGNOSIS — Z7982 Long term (current) use of aspirin: Secondary | ICD-10-CM | POA: Diagnosis not present

## 2023-01-16 DIAGNOSIS — I4510 Unspecified right bundle-branch block: Secondary | ICD-10-CM | POA: Diagnosis not present

## 2023-01-16 DIAGNOSIS — I4891 Unspecified atrial fibrillation: Secondary | ICD-10-CM | POA: Diagnosis not present

## 2023-01-16 DIAGNOSIS — G894 Chronic pain syndrome: Secondary | ICD-10-CM | POA: Diagnosis not present

## 2023-01-16 NOTE — Progress Notes (Incomplete)
Location of Breast Cancer: left chest wall  Histology per Pathology Report:  08-21-22   11-26-22 FINAL MICROSCOPIC DIAGNOSIS:  A. CHEST WALL, LEFT, EXCISION:  - Skin and underlying soft tissue involved by patient's known breast  carcinoma  - Inked anterior, lateral and superior margins are involved by carcinoma   B. CHEST WALL, LEFT CENTRAL, EXCISION:  - Skin and underlying soft tissue with reactive changes  - Negative for carcinoma  - See comment   C. CHEST WALL, LATERAL, EXCISION:  - Skin and underlying soft tissue, negative for carcinoma   COMMENT:   B. Immunohistochemical stain for cytokeratin AE1/AE3 does not show  evidence of carcinoma.   12-10-22 FINAL MICROSCOPIC DIAGNOSIS:   A. LEFT CHEST WALL, EXCISION:  - Skin and underlying soft tissue with invasive mammary carcinoma with  mixed ductal and lobular features  - Margins:      - Anterior and superior margins are involved by carcinoma      - Inferior margin is less than 1.0 mm away from carcinoma      - Lateral margin is 2.0 mm away from carcinoma  -  See comment   COMMENT:  Immunohistochemical staining for E-cadherin is performed on block A6 and  shows intact membranous staining.  The overall morphologic and  immunohistochemical features are consistent with the above diagnosis.   01-10-23 FINAL MICROSCOPIC DIAGNOSIS:   A. BREAST, LEFT CHEST WALL, RE-EXCISION:  - Skin and underlying soft tissue involved by patient's known breast  carcinoma  - Inked superior margin and the lateral tip are involved by carcinoma  - Previous procedure-related changes   Receptor Status: ER +95% 3+ PR 80% 3+ HER2 2+ by IHC.  Her 2 FISH negative.  Did patient present with symptoms (if so, please note symptoms) or was this found on screening mammography?: (per Dr. Doreen Salvage note on 11-26-22) She had an area on her chest that she was concerned about she underwent a shave biopsy at her dermatologist. This shows this to be metastatic  breast adenocarcinoma extending to the edge and the base of the shave biopsy.   Past/Anticipated interventions by surgeon, if any: Dr. Ellwood Handler & P on 11-26-22 82 year old female who I know well from prior surgery. She underwent an initial lumpectomy as well as sentinel lymph node biopsy in 2017. This was followed by oncoplastic breast reconstruction and the left side had significant fat necrosis and wound issues so she we then proceeded and did a left mastectomy due to the nonhealing wound and fat necrosis.   12-10-22 Procedure:Re-excision of left chest wall margins for recurrent breast cancer, 6x4x2 cm Surgeon: Dr Harden Mo  12-21-22 Preoperative diagnosis: recurrent breast cancer, chest wall, s/p excision Postoperative diagnosis: same as above Procedure:Re-excision of left chest wall margins for recurrent breast cancer, 10x8x2 cm Surgeon: Dr Harden Mo  Past/Anticipated interventions by medical oncology, if any:  BREAST CANCER HISTORY: Dr. Al Pimple on 11-09-22 From the original intake note:   Journie had minor trauma to the left breast but did not think much about it until while swimming on the medication she felt a hard mass in her left breast. She brought it to medical attention and on 05/04/2016 she underwent left diagnostic mammography with tomography at the breast Center. This showed the breast density to be category B. In the left breast upper inner quadrant there was a persistent area of asymmetry measuring 4.9 cm. On exam this was a firm but poorly defined palpable mass. Biopsy of this mass 05/04/2016 showed (  [id number]) an invasive ductal carcinoma, grade 1, estrogen receptor 90% positive with moderate staining intensity, progesterone receptor 90% positive with strong staining intensity, with an MIB-1 of 10%, and no HER-2 amplification, the signals ratio being 1.19 and the number per cell 1.90.   On 05/09/2016 the patient had ultrasonography of the left axilla which was  benign. On the same day she had a second biopsy of what is either a second mass or a distant area of the same mass (in the same quadrant) and this showed ([id number]) invasive ductal carcinoma, grade 2, estrogen receptor 100% positive, progesterone receptor 90% positive, both with strong staining intensity, with an MIB-1 of 15%, and no HER-2 amplification, the signals ratio being 1.50 and the number per cell 3.15.  ASSESSMENT: 82 y.o. [place] woman status post left breast upper inner quadrant biopsy 05/04/2016 for a clinical T2 N0 invasive ductal carcinoma, grade 1 estrogen and progesterone receptor positive, HER-2 nonamplified, with an MIB-1 of 10%.   (1) biopsy of a second area 05/09/2016 also in the upper inner quadrant of the left breast showed invasive ductal carcinoma, grade 2, estrogen and progesterone receptor positive, HER-2 nonamplified, with an MIB-1 of 15%.   (2) status post left lumpectomy and sentinel lymph node sampling 06/13/2016 for an mpT1c pN0(i+), stage IA invasive ductal carcinoma, grade 1, with close but negative margins             (a) status post bilateral reduction mammoplasty with left oncoplastic surgery 06/25/2016             (b) status post debridement of left nipple/areolar necrosis 07/11/2016             (c) status post left simple mastectomy 08/02/2016 showing inflammation and abscess but no evidence of malignancy   (3) Oncotype score of 18 predicts a 10 year risk of recurrence outside the breast of 11% if the patient's only systemic therapy is tamoxifen for 5 years. It also predicts no significant benefit from chemotherapy   (4) postmastectomy radiation not indicated   (5) started anastrozole September 2017, completing five years November 2022             (a) DEXA scan 04/17/2017 showed a T score of -1.8                        (B DEXA scan 01/26/2020) showed a T score of -2.2.   (6) since last visit she had a dermatology appointment on December 12 and had some  lesions noted which were shave biopsied from the left chest wall. Pathology from that showed dermal deposits of metastatic breast adenocarcinoma extending to the edge and base, ER +95% 3+ PR 80% 3+ HER2 2+ by IHC.  Her 2 FISH negative.   PLAN:  We have reviewed her recent pathology report from the shave biopsy which showed ER/PR positive invasive adenocarcinoma of the breast, HER2 equivocal by IHC, FISH negative.   PET scan with no metastatic disease.  She will continue on faslodex in the interim. I didn't consider Tamoxifen given her sedentary status but this is certainly an option in the future. Dr Dwain Sarna recommended [place] to assess the local extent of disease.  She had ultrasound which showed known skin metastasis only the largest was visualized on the ultrasound.  No mass identified in the chest wall deep to the skin.  She will follow-up with Dr. Dwain Sarna for additional surgical recommendations. In the interim she will continue faslodex  every 28 days and RTC in 3 months.  Lymphedema issues, if any:  {:18581} {t:21944}   Pain issues, if any:  {:18581} {PAIN DESCRIPTION:21022940}  SAFETY ISSUES: Prior radiation? {:18581} Pacemaker/ICD? {:18581} Possible current pregnancy?no Is the patient on methotrexate? no  Current Complaints / other details:  ***

## 2023-01-16 NOTE — Telephone Encounter (Signed)
5/1 @ 8:32 am Left voicemail on both patient's contact #s for patient to call our office to be schedule for consult with Dr. Basilio Cairo.

## 2023-01-17 ENCOUNTER — Telehealth: Payer: Self-pay

## 2023-01-17 ENCOUNTER — Encounter: Payer: Self-pay | Admitting: Radiation Oncology

## 2023-01-17 NOTE — Telephone Encounter (Signed)
Rn called pt to obtain meaningful use and nurse evaluation information. Consult note complete and routed to Dr. Basilio Cairo.

## 2023-01-17 NOTE — Progress Notes (Signed)
Radiation Oncology         [phone] ________________________________  Initial Outpatient Consultation  Name: Debra Griffith MRN: [id number]  Date: 01/18/2023  DOB: 02/26/1941  WU:[id number], Misty Stanley, MD  [name], MD   REFERRING PHYSICIAN: [name], MD  DIAGNOSIS:    ICD-10-CM   1. Malignant neoplasm of central portion of left breast in female, estrogen receptor positive (HCC)  C50.112    Z17.0     2. Malignant neoplasm of upper-inner quadrant of left breast in female, estrogen receptor positive (HCC)  C50.212    Z17.0        Cancer Staging  Malignant neoplasm of upper-inner quadrant of left breast in female, estrogen receptor positive (HCC) Staging form: Breast, AJCC 7th Edition - Pathologic stage from 06/13/2016: Stage IA (T1c, N0(i+), cM0) - Signed by Lonie Peak, MD on 01/19/2023 Stage prefix: Initial diagnosis Histologic grade (G): G1 Estrogen receptor status: Positive Progesterone receptor status: Positive HER2 status: Negative - Pathologic stage from 01/18/2023: Stage Unknown (rT4b, NX, cM0) - Signed by Lonie Peak, MD on 01/19/2023 Stage prefix: Recurrence Laterality: Left  Left chest wall mass consistent with metastatic breast adenocarcinoma: s/p excision   Stage IA (T1c, N0(i+), cM0) Left Breast UIQ Invasive Ductal Carcinoma diagnosed in 2017, ER+ / PR+ / Her2-, Grade 1: s/p lumpectomy, bilateral breast reduction, and left breast mastectomy, followed by antiestrogen therapy completed in November of 2022  CHIEF COMPLAINT: Here to discuss management of metastatic left breast cancer  HISTORY OF PRESENT ILLNESS::Debra Griffith is a 82 y.o. female who was initially diagnosed with left breast cancer in 2017. She first noticed a palpable left breast lump after suffering minor trauma to the left breast while swimming. Subsequently, she presented to medical attention and had a left breast diagnostic mammogram and left breast ultrasound performed on 04/03/2016  which showed post-traumatic changes most consistent with bruising/hematoma development in the area of palpable concern. Regardless of this finding, follow up imaging was recommended, and she presented for a follow up diagnostic mammogram and ultrasound on 05/05/23 which showed but an area of asymmetry measuring 4.9 cm in the upper inner left breast.    Biopsy of the upper inner left breast asymmetry on 05/04/2016 showed grade 1 invasive ductal carcinoma, grade 1; estrogen receptor 90% positive with moderate staining intensity, progesterone receptor 90% positive with strong staining intensity, Her2 negative; Proliferation marker Ki67 at 10%. Left axillary ultrasound on 05/09/2016 showed no evidence of lymphadenopathy.   On 05/09/2016, she had a second biopsy of what was either a second mass or a distant area of the same mass (in the same quadrant but specimen description is unclear) which showed grade 2 invasive ductal carcinoma, estrogen receptor 100% positive, progesterone receptor 90% positive, both with strong staining intensity; Her2 negative; Proliferation marker Ki67 at 15%.    She opted to proceed with a left breast lumpectomy with SLN sampling on 06/13/2016. Pathology from the procedure showed: two foci of grade 1 invasive ductal carcinoma with negative (but close final margins) measuring 1.7 cm and 1.5 cm respectively; along with [facility] and evidence of PNI. 1/2 left axillary SLN excisions positive for carcinoma. ER 90% positive with moderate staining intensity, PR 90% positive with strong staining intensity, Her2 negative; Proliferation marker Ki67 at 10%. Left axillary ultrasound on 05/09/2016 showed no evidence of lymphadenopathy.   She then underwent bilateral reduction mammoplasties with left oncoplastic surgery on 06/25/2016. Her post operative course was complicated due to healing issues and necrosis, and she ultimately  required debridement of left nipple/areolar necrosis on 07/11/2016. She then  opted to proceed with a left simple mastectomy on 08/02/2016 which showed no evidence of residual malignancy, with evidence of inflammation and abscess. ER 90% positive; PR 90% positive; Her2 negative.   Oncotype DX was obtained on the final surgical sample and the recurrence score of 18 predicted a risk of recurrence outside the breast over the next 9 years of 11%, if the patient's only systemic therapy were to be an antiestrogen for 5 years. It also predicted no significant benefit from chemotherapy.  Postmastectomy radiation was not indicated, and she continued with antiestrogen therapy consisting of anastrozole x 5 years which she completed in November of 2022.   New chest wall lesion:   This past December 2023, the patient presented to dermatology for evaluation of a new lesion on her left chest. This was biopsied on 08/21/22 and showed findings consistent with metastatic breast adenocarcinoma (extending to the edge and base). Prognostic indicators significant for: estrogen receptor >95% positive; progesterone receptor 80% positive; Her2 status equivocal by IHC, negative by in situ hybridization. (A right antitragus lesion was also biopsied on that same date and showed BCC).   Accordingly, the patient was referred to Dr. Al Pimple on 09/22/23 for further management. During this visit, the patient reported having skin changes for the past year or so. For her recurrent disease, Dr. Al Pimple recommended proceeding with staging work-up including a PET/CT, along with a bone scan and possible imaging of the chest. She was also started on Faslodex at this time.   Subsequent PET scan on 10/10/22 showed mild skin thickening over the medial left anterior chest with very mild FDG uptake and no focal nodular areas. Overall, PET showed no signs of FDG avid disease to indicate focal disease recurrence.   She was then referred to Dr. Dwain Sarna who recommended a left breast ultrasound to better assess the extent of  disease. Subsequent left breast ultrasound on 10/17/22 showed a 3-4 mm mass within the left breast skin, likely correlating with the biopsy proven metastatic breast adenocarcinoma. No other abnormalities in the left breast or axilla were appreciated, however multiple other small superficial skin base lumps were pointed out by the patient near her lumpectomy scar. The mass noted 3-4 mm mass above is the largest of these.   (Left breast pre-operative ultrasound on 11/23/22 showed a slight increase in size of the left chest wall/breast mass, measuring 5 x 3 mm, previously measuring 3-4 mm).   Given that there was no evidence of metastatic disease on her work-up, she was cleared to undergo excision of the left chest wall mass. on 11/26/22 under the care of Dr. Dwain Sarna. Pathology showed findings consistent with breast carcinoma, with involvement of the anterior, lateral and superior margins. A left central chest wall and lateral chest wall excision were also submitted to pathology and showed no evidence of carcinoma.   She accordingly underwent re-excision of the positive left chest wall margins on 12/10/22 under Dr. Dwain Sarna. Pathology showed: invasive mammary carcinoma with mixed ductal and lobular features in the skin and underlying soft tissues, with the anterior and superior margins involved by carcinoma (2nd closest margin status to invasive disease of less than 1 mm from the inferior margin).   She subsequently underwent re-excision of the left chest wall again on 01/10/23. Final pathology showed carcinoma involving the skin and underling soft tissues, including the inked superior margin and lateral tip.   PREVIOUS RADIATION THERAPY: No  PAST MEDICAL  HISTORY:  has a past medical history of Anemia, Anxiety, Breast cancer in female [facility]) (03/2016), Chronic bronchitis (HCC), Chronic lower back pain, Complication of anesthesia, CRI (chronic renal insufficiency) (07/27/2016), DDD (degenerative disc  disease), lumbar, Depression, Dyspnea, History of recent fall, Hypertension, Inflammatory arthritis (07/27/2016), Insomnia, Irregular heart beats, Macular degeneration of right eye, New onset atrial fibrillation (HCC), OA (osteoarthritis) of hip (06/24/2012), OA (osteoarthritis) of knee (11/02/2013), Obesity, Osteoarthritis of both feet (07/27/2016), Osteoarthritis of both hands (07/27/2016), PONV (postoperative nausea and vomiting), Postop Acute blood loss anemia (06/25/2012), Postop Hyponatremia (06/25/2012), Postop Sinus tachycardia (06/27/2012), RBBB (right bundle branch block) (07/27/2016), Rheumatoid arthritis (HCC), Right bundle branch block, Right bundle branch block, S/p dental crown, Status post total bilateral knee replacement (11/13/2013), and Vitamin D deficiency.    PAST SURGICAL HISTORY: Past Surgical History:  Procedure Laterality Date   AXILLARY SURGERY Left 06/25/2016   Aspiration of left axillary seroma    BREAST BIOPSY Left 03/2016   BREAST CYST EXCISION Left 11/26/2022   Procedure: LEFT CHEST WALL MASS EXCISION;  Surgeon: [name], MD;  Location: [facility] OR;  Service: General;  Laterality: Left;   BREAST LUMPECTOMY WITH RADIOACTIVE SEED AND SENTINEL LYMPH NODE BIOPSY Left 06/13/2016   Procedure: LEFT BREAST LUMPECTOMY WITH BRACKETED  RADIOACTIVE SEED AND SENTINEL LYMPH NODE BIOPSY;  Surgeon: [name], MD;  Location: [facility] OR;  Service: General;  Laterality: Left;   BREAST RECONSTRUCTION Bilateral 06/25/2016   BILATERAL ONCOPLASTIC BREAST RECONSTRUCTION WITH BREAST REDUCTION   BREAST RECONSTRUCTION WITH PLACEMENT OF TISSUE EXPANDER AND FLEX HD (ACELLULAR HYDRATED DERMIS) Bilateral 06/25/2016   Procedure: BILATERAL ONCOPLASTIC BREAST RECONSTRUCTION WITH BREAST REDUCTION, Aspiration of left axillary seroma;  Surgeon: Glenna Fellows, MD;  Location: [facility] OR;  Service: Plastics;  Laterality: Bilateral;   BREAST REDUCTION SURGERY Bilateral 06/25/2016   Procedure: MAMMARY  REDUCTION  (BREAST) BILATERAL;  Surgeon: Glenna Fellows, MD;  Location: [facility] OR;  Service: Plastics;  Laterality: Bilateral;   CESAREAN SECTION  1966; 1971; 1973   COLONOSCOPY     DEBRIDEMENT AND CLOSURE WOUND Right 07/11/2016   Procedure: DEBRIDEMENT LEFT BREAST;  Surgeon: Glenna Fellows, MD;  Location: [facility] OR;  Service: Plastics;  Laterality: Right;   EYE SURGERY     INTERSTIM IMPLANT PLACEMENT N/A 08/29/2021   Procedure: Leane Platt IMPLANT FIRST STAGE;  Surgeon: Alfredo Martinez, MD;  Location: [facility] ORS;  Service: Urology;  Laterality: N/A;   INTERSTIM IMPLANT PLACEMENT N/A 08/29/2021   Procedure: Leane Platt IMPLANT SECOND STAGE AND IMPEDANCE CHECK;  Surgeon: Alfredo Martinez, MD;  Location: [facility] ORS;  Service: Urology;  Laterality: N/A;   JOINT REPLACEMENT     KNEE ARTHROSCOPY Right    "before replacement"   MASS EXCISION Left 12/10/2022   Procedure: LEFT CHEST WALL MASS MARGIN EXCISION;  Surgeon: [name], MD;  Location: [facility] OR[facility];  Service: General;  Laterality: Left;   MASTECTOMY COMPLETE / SIMPLE Left 08/02/2016   total   RE-EXCISION OF BREAST CANCER,SUPERIOR MARGINS Left 01/10/2023   Procedure: RE-EXCISION OF LEFT CHEST WALL BREAST CANCER;  Surgeon: [name], MD;  Location: [facility] OR;  Service: General;  Laterality: Left;   REDUCTION MAMMAPLASTY Bilateral 06/25/2016   TOTAL ABDOMINAL HYSTERECTOMY W/ BILATERAL SALPINGOOPHORECTOMY     TOTAL HIP ARTHROPLASTY  06/24/2012   Procedure: TOTAL HIP ARTHROPLASTY;  Surgeon: Loanne Drilling, MD;  Location: [facility];  Service: Orthopedics;  Laterality: Left;   TOTAL KNEE ARTHROPLASTY N/A 11/02/2013   Procedure: LEFT TOTAL KNEE ARTHROPLASTY WITH RIGHT KNEE CORTISONE INJECTION;  Surgeon: Gus Rankin  Aluisio, MD;  Location: [facility];  Service: Orthopedics;  Laterality: N/A;   TOTAL KNEE ARTHROPLASTY Right 05/31/2014   Procedure: RIGHT TOTAL KNEE ARTHROPLASTY;  Surgeon: Loanne Drilling, MD;  Location: [facility];  Service: Orthopedics;  Laterality:  Right;   TOTAL MASTECTOMY Left 08/02/2016   Procedure: LEFT TOTAL MASTECTOMY;  Surgeon: [name], MD;  Location: [facility] OR;  Service: General;  Laterality: Left;   TUBAL LIGATION  1973   WISDOM TOOTH EXTRACTION  1970's   admitted to hospital for surgery    FAMILY HISTORY: family history includes Heart Problems in her father; Hypertension in her father, mother, and sister; Prostate cancer in her father.  SOCIAL HISTORY:  reports that she has never smoked. She has never used smokeless tobacco. She reports that she does not currently use drugs. She reports that she does not drink alcohol.  ALLERGIES: Morphine and related and Other  MEDICATIONS:  Current Outpatient Medications  Medication Sig Dispense Refill   aspirin EC 81 MG tablet Take 81 mg by mouth daily. Swallow whole.     Calcium Carbonate Antacid (TUMS PO) Take 1 tablet by mouth as needed (heartburn).     Cholecalciferol (VITAMIN D) 125 MCG (5000 UT) CAPS Take 5,000 Units by mouth daily.     clonazePAM (KLONOPIN) 1 MG tablet Take 1 mg by mouth daily.     cyclobenzaprine (FLEXERIL) 10 MG tablet Take 20 mg by mouth at bedtime.     ferrous sulfate 325 (65 FE) MG EC tablet Take 325 mg by mouth daily.     furosemide (LASIX) 20 MG tablet Take 20 mg by mouth daily.     HYDROcodone-acetaminophen (NORCO) 10-325 MG tablet Take 1 tablet by mouth 5 (five) times daily as needed for moderate pain.     metoprolol tartrate (LOPRESSOR) 25 MG tablet Take 25 mg by mouth 2 (two) times daily.      Multiple Vitamins-Minerals (PRESERVISION AREDS 2+MULTI VIT PO) Take 1 capsule by mouth in the morning and at bedtime.     nitrofurantoin (MACRODANTIN) 100 MG capsule Take 1 capsule (100 mg total) by mouth at bedtime. (Patient taking differently: Take 100 mg by mouth daily.)     OVER THE COUNTER MEDICATION Take 1 tablet by mouth daily. Bone up     OVER THE COUNTER MEDICATION Take 2 tablets by mouth daily as needed (constipation). Swiss Kriss      rosuvastatin (CRESTOR) 5 MG tablet Take 5 mg by mouth daily.     triamterene-hydrochlorothiazide (MAXZIDE-25) 37.5-25 MG tablet Take 1 tablet by mouth daily.     valsartan (DIOVAN) 320 MG tablet Take 320 mg by mouth daily.     venlafaxine XR (EFFEXOR-XR) 75 MG 24 hr capsule Take 1 capsule (75 mg total) by mouth 2 (two) times daily. 180 capsule 4   zolpidem (AMBIEN) 10 MG tablet Take 10 mg by mouth at bedtime.     No current facility-administered medications for this encounter.   Facility-Administered Medications Ordered in Other Encounters  Medication Dose Route Frequency Provider Last Rate Last Admin   methocarbamol (ROBAXIN) tablet 500 mg  500 mg Oral Q6H PRN [name], MD       metroNIDAZOLE (FLAGYL) tablet 500 mg  500 mg Oral Q8H [name], MD       ondansetron (ZOFRAN-ODT) disintegrating tablet 4 mg  4 mg Oral Q6H PRN [name], MD       Or   ondansetron [facility]) 4 mg in sodium chloride 0.9 % 50 mL  IVPB  4 mg Intravenous Q6H PRN [name], MD       oxyCODONE (Oxy IR/ROXICODONE) immediate release tablet 5-10 mg  5-10 mg Oral Q4H PRN [name], MD       simethicone [facility]) chewable tablet 40 mg  40 mg Oral Q6H PRN [name], MD        REVIEW OF SYSTEMS: As above in HPI.   PHYSICAL EXAM:  vitals were not taken for this visit.   General: Alert and oriented, in no acute distress, in wheelchair HEENT: Head is normocephalic. Extraocular movements are intact.   Skin: No concerning lesions on chest wall c/w tumor Musculoskeletal:  using a WC  Ext: able to raise left arm above head Neurologic: Cranial nerves II through XII are grossly intact. No obvious focalities. Speech is fluent.   Psychiatric: Judgment and insight are intact. Affect is appropriate. Breasts: Left chest wall s/p mastectomy with sutures still in place medially . No sign of local recurrence at this time   ECOG = 3  0 - Asymptomatic (Fully active, able to carry on  all predisease activities without restriction)  1 - Symptomatic but completely ambulatory (Restricted in physically strenuous activity but ambulatory and able to carry out work of a light or sedentary nature. For example, light housework, office work)  2 - Symptomatic, <50% in bed during the day (Ambulatory and capable of all self care but unable to carry out any work activities. Up and about more than 50% of waking hours)  3 - Symptomatic, >50% in bed, but not bedbound (Capable of only limited self-care, confined to bed or chair 50% or more of waking hours)  4 - Bedbound (Completely disabled. Cannot carry on any self-care. Totally confined to bed or chair)  5 - Death   [name] MM, Creech RH, Tormey DC, et al. [id number]). "Toxicity and response criteria of the [facility]". Am. Evlyn Clines. Oncol. 5 (6): 649-55   LABORATORY DATA:  Lab Results  Component Value Date   WBC 7.9 01/04/2023   HGB 13.2 01/04/2023   HCT 42.7 01/04/2023   MCV 92.0 01/04/2023   PLT 256 01/04/2023   CMP     Component Value Date/Time   NA 139 01/10/2023 0620   NA 140 07/04/2017 1112   K 3.5 01/10/2023 0620   K 4.0 07/04/2017 1112   CL 98 01/10/2023 0620   CO2 28 01/10/2023 0620   CO2 28 07/04/2017 1112   GLUCOSE 163 (H) 01/10/2023 0620   GLUCOSE 106 07/04/2017 1112   BUN 22 01/10/2023 0620   BUN 26.7 (H) 07/04/2017 1112   CREATININE 0.90 01/10/2023 0620   CREATININE 0.99 08/14/2021 1120   CREATININE 1.1 07/04/2017 1112   CALCIUM 10.0 01/10/2023 0620   CALCIUM 9.8 07/04/2017 1112   PROT 6.7 01/04/2023 1149   PROT 6.9 07/04/2017 1112   ALBUMIN 3.6 01/04/2023 1149   ALBUMIN 3.8 07/04/2017 1112   AST 29 01/04/2023 1149   AST 20 08/14/2021 1120   AST 21 07/04/2017 1112   ALT 25 01/04/2023 1149   ALT 24 08/14/2021 1120   ALT 29 07/04/2017 1112   ALKPHOS 100 01/04/2023 1149   ALKPHOS 87 07/04/2017 1112   BILITOT 0.4 01/04/2023 1149   BILITOT 0.5 08/14/2021 1120   BILITOT 0.37  07/04/2017 1112   GFRNONAA >60 01/10/2023 0620   GFRNONAA 58 (L) 08/14/2021 1120   GFRAA >60 06/08/2020 0857   GFRAA 53 (L) 02/05/2018 1012  RADIOGRAPHY:   as above    IMPRESSION/PLAN: Locally recurrent left breast cancer with positive margins after multiple chest wall excisions   It was a pleasure meeting the patient today. We discussed the risks, benefits, and side effects of radiotherapy. I recommend radiotherapy to the chest wall with high tangents to cover the chest wall and highest risk nodes and to reduce her risk of locoregional recurrence. I may consider empiric coverage of her SCV/PAB nodes as well.   We discussed that radiation would take approximately 4 weeks to complete and that I would give the patient a two and a half to 3 more weeks to heal following surgery before starting treatment - estimated start date 5/22 unless healing is not adequate. I'll coordinate care with Dr. Dwain Sarna.  We discussed that this method, using standard hypofractionation, is less standard than conventional radiation over 6 weeks, but is probably going to be easier for her to tolerate given her comorbidities. She and her husband are enthusiastic to proceed.   We spoke about acute effects including skin irritation, skin peeling, and fatigue as well as much less common late effects including internal organ injury to heart, lungs, ribs, and /or soft tissue/nerves. We spoke about the latest technology that is used to minimize the risk of late effects for patients undergoing radiotherapy to the breast or chest wall. No guarantees of treatment were given. The patient is enthusiastic about proceeding with treatment. I look forward to participating in the patient's care.  Anticipate CT simulation in 1.5 wks.  On date of service, in total, I spent 60 minutes on this encounter. Patient was seen in person. Note signed after date of service; minutes pertain to date of service only.    __________________________________________   Lonie Peak, MD  This document serves as a record of services personally performed by Lonie Peak, MD. It was created on her behalf by Neena Rhymes, a trained medical scribe. The creation of this record is based on the scribe's personal observations and the provider's statements to them. This document has been checked and approved by the attending provider.

## 2023-01-18 ENCOUNTER — Ambulatory Visit
Admission: RE | Admit: 2023-01-18 | Discharge: 2023-01-18 | Disposition: A | Discharge status: Home / Self Care | Payer: Medicare Other | Source: Ambulatory Visit | Attending: Radiation Oncology | Admitting: Radiation Oncology

## 2023-01-18 ENCOUNTER — Other Ambulatory Visit: Payer: Self-pay

## 2023-01-18 ENCOUNTER — Inpatient Hospital Stay: Payer: Medicare Other | Attending: Hematology and Oncology | Admitting: Hematology and Oncology

## 2023-01-18 ENCOUNTER — Inpatient Hospital Stay: Payer: Medicare Other

## 2023-01-18 VITALS — BP 161/88 | HR 80 | Temp 97.9°F | Resp 16

## 2023-01-18 DIAGNOSIS — M19071 Primary osteoarthritis, right ankle and foot: Secondary | ICD-10-CM | POA: Insufficient documentation

## 2023-01-18 DIAGNOSIS — C50212 Malignant neoplasm of upper-inner quadrant of left female breast: Secondary | ICD-10-CM | POA: Insufficient documentation

## 2023-01-18 DIAGNOSIS — G4700 Insomnia, unspecified: Secondary | ICD-10-CM | POA: Insufficient documentation

## 2023-01-18 DIAGNOSIS — Z79899 Other long term (current) drug therapy: Secondary | ICD-10-CM | POA: Insufficient documentation

## 2023-01-18 DIAGNOSIS — F419 Anxiety disorder, unspecified: Secondary | ICD-10-CM | POA: Insufficient documentation

## 2023-01-18 DIAGNOSIS — Z79818 Long term (current) use of other agents affecting estrogen receptors and estrogen levels: Secondary | ICD-10-CM | POA: Diagnosis not present

## 2023-01-18 DIAGNOSIS — Z170 Estrogen receptor positive status [ER+]: Secondary | ICD-10-CM

## 2023-01-18 DIAGNOSIS — Z9012 Acquired absence of left breast and nipple: Secondary | ICD-10-CM | POA: Insufficient documentation

## 2023-01-18 DIAGNOSIS — I4891 Unspecified atrial fibrillation: Secondary | ICD-10-CM | POA: Insufficient documentation

## 2023-01-18 DIAGNOSIS — Z7982 Long term (current) use of aspirin: Secondary | ICD-10-CM | POA: Insufficient documentation

## 2023-01-18 DIAGNOSIS — D631 Anemia in chronic kidney disease: Secondary | ICD-10-CM | POA: Insufficient documentation

## 2023-01-18 DIAGNOSIS — C50112 Malignant neoplasm of central portion of left female breast: Secondary | ICD-10-CM | POA: Insufficient documentation

## 2023-01-18 DIAGNOSIS — Z96653 Presence of artificial knee joint, bilateral: Secondary | ICD-10-CM | POA: Insufficient documentation

## 2023-01-18 DIAGNOSIS — M069 Rheumatoid arthritis, unspecified: Secondary | ICD-10-CM | POA: Insufficient documentation

## 2023-01-18 DIAGNOSIS — E559 Vitamin D deficiency, unspecified: Secondary | ICD-10-CM | POA: Diagnosis not present

## 2023-01-18 DIAGNOSIS — N189 Chronic kidney disease, unspecified: Secondary | ICD-10-CM | POA: Insufficient documentation

## 2023-01-18 DIAGNOSIS — M19042 Primary osteoarthritis, left hand: Secondary | ICD-10-CM | POA: Insufficient documentation

## 2023-01-18 DIAGNOSIS — M170 Bilateral primary osteoarthritis of knee: Secondary | ICD-10-CM | POA: Insufficient documentation

## 2023-01-18 DIAGNOSIS — I129 Hypertensive chronic kidney disease with stage 1 through stage 4 chronic kidney disease, or unspecified chronic kidney disease: Secondary | ICD-10-CM | POA: Insufficient documentation

## 2023-01-18 MED ORDER — FULVESTRANT 250 MG/5ML IM SOSY
500.0000 mg | PREFILLED_SYRINGE | Freq: Once | INTRAMUSCULAR | Status: AC
  Administered 2023-01-18: 500 mg via INTRAMUSCULAR
  Filled 2023-01-18: qty 10

## 2023-01-18 NOTE — Progress Notes (Signed)
[facility]  Telephone:([phone]) [phone] [phone]) [phone]     ID: Debra Griffith DOB: 1941-05-01  MR#: [id number]  UVO#:[id number]  Patient Care Team: Sigmund Hazel, MD as PCP - General (Family Medicine) [name], MD as Consulting Physician (General Surgery) Glenna Fellows, MD as Consulting Physician (Plastic Surgery) Ollen Gross, MD as Consulting Physician (Orthopedic Surgery) Pollyann Savoy, MD as Consulting Physician (Rheumatology) Axel Filler, Larna Daughters, NP as Nurse Practitioner (Hematology and Oncology) Alfredo Martinez, MD as Consulting Physician (Urology) Juanell Fairly, RN as Case Manager Rachel Moulds, MD as Consulting Physician (Hematology and Oncology) OTHER MD:   CHIEF COMPLAINT: Estrogen receptor positive breast cancer (s/p left mastectomy)  CURRENT TREATMENT: None  INTERVAL HISTORY:  Debra Griffith returns today for follow-up of her estrogen receptor positive breast cancer.  She recently was found to have local recurrence and has reestablished with medical oncology PET didn't show any evidence of met disease.  She is now on faslodex for anti estrogen therapy. She is now status post reexcision with positive margins.  She will be seeing Dr. Dwain Sarna on 510.  She also has a follow-up appointment with Dr. Basilio Cairo today for consideration of radiation.She denies any issues with faslodex. Surgical site healing well.   COVID 19 VACCINATION STATUS: Pfizer x3 as of November 2022   BREAST CANCER HISTORY: From the original intake note:  Debra Griffith had minor trauma to the left breast but did not think much about it until while swimming on the medication she felt a hard mass in her left breast. She brought it to medical attention and on 05/04/2016 she underwent left diagnostic mammography with tomography at the breast Center. This showed the breast density to be category B. In the left breast upper inner quadrant there was a persistent area of asymmetry  measuring 4.9 cm. On exam this was a firm but poorly defined palpable mass. Biopsy of this mass 05/04/2016 showed ([id number]) an invasive ductal carcinoma, grade 1, estrogen receptor 90% positive with moderate staining intensity, progesterone receptor 90% positive with strong staining intensity, with an MIB-1 of 10%, and no HER-2 amplification, the signals ratio being 1.19 and the number per cell 1.90.  On 05/09/2016 the patient had ultrasonography of the left axilla which was benign. On the same day she had a second biopsy of what is either a second mass or a distant area of the same mass (in the same quadrant) and this showed ([id number]) invasive ductal carcinoma, grade 2, estrogen receptor 100% positive, progesterone receptor 90% positive, both with strong staining intensity, with an MIB-1 of 15%, and no HER-2 amplification, the signals ratio being 1.50 and the number per cell 3.15.  The patient's subsequent history is as detailed below.   PAST MEDICAL HISTORY: Past Medical History:  Diagnosis Date   Anemia    hx of    Anxiety    Breast cancer in female [facility]) 03/2016   left   Chronic bronchitis (HCC)    FLARE UPS USUALLY ONCE A YEAR   Chronic lower back pain    Complication of anesthesia    trouble waking up    CRI (chronic renal insufficiency) 07/27/2016   DDD (degenerative disc disease), lumbar    Depression    Dyspnea    History of recent fall    "twice on Sunday; once yesterday" (08/29/2016)   Hypertension    Inflammatory arthritis 07/27/2016   Sero Negative, Positive Synovitis hands, WJ   Insomnia    Irregular heart beats    Macular  degeneration of right eye    New onset atrial fibrillation (HCC)    OA (osteoarthritis) of hip 06/24/2012   OA (osteoarthritis) of knee 11/02/2013   L knee    Obesity    Osteoarthritis of both feet 07/27/2016   Osteoarthritis of both hands 07/27/2016   PONV (postoperative nausea and vomiting)    SEVERE N&V AND DIARRHEA AFTER  HYSTERECTOMY AND AFTER WISDOM TEETH EXTRACTIONS - NO PROBLEMS WITH LAST 2 SURGERIES - THE HIP AND LEFT KNEE   Postop Acute blood loss anemia 06/25/2012   Postop Hyponatremia 06/25/2012   Postop Sinus tachycardia 06/27/2012   RBBB (right bundle branch block) 07/27/2016   Rheumatoid arthritis (HCC)    "doctor recently took me off all RX for this" (08/02/2016)   Right bundle branch block    Right bundle branch block    S/p dental crown    dental crowns on every tooth   Status post total bilateral knee replacement 11/13/2013   Vitamin D deficiency     PAST SURGICAL HISTORY: Past Surgical History:  Procedure Laterality Date   AXILLARY SURGERY Left 06/25/2016   Aspiration of left axillary seroma    BREAST BIOPSY Left 03/2016   BREAST CYST EXCISION Left 11/26/2022   Procedure: LEFT CHEST WALL MASS EXCISION;  Surgeon: [name], MD;  Location: [facility] OR;  Service: General;  Laterality: Left;   BREAST LUMPECTOMY WITH RADIOACTIVE SEED AND SENTINEL LYMPH NODE BIOPSY Left 06/13/2016   Procedure: LEFT BREAST LUMPECTOMY WITH BRACKETED  RADIOACTIVE SEED AND SENTINEL LYMPH NODE BIOPSY;  Surgeon: [name], MD;  Location: [facility] OR;  Service: General;  Laterality: Left;   BREAST RECONSTRUCTION Bilateral 06/25/2016   BILATERAL ONCOPLASTIC BREAST RECONSTRUCTION WITH BREAST REDUCTION   BREAST RECONSTRUCTION WITH PLACEMENT OF TISSUE EXPANDER AND FLEX HD (ACELLULAR HYDRATED DERMIS) Bilateral 06/25/2016   Procedure: BILATERAL ONCOPLASTIC BREAST RECONSTRUCTION WITH BREAST REDUCTION, Aspiration of left axillary seroma;  Surgeon: Glenna Fellows, MD;  Location: [facility] OR;  Service: Plastics;  Laterality: Bilateral;   BREAST REDUCTION SURGERY Bilateral 06/25/2016   Procedure: MAMMARY REDUCTION  (BREAST) BILATERAL;  Surgeon: Glenna Fellows, MD;  Location: [facility] OR;  Service: Plastics;  Laterality: Bilateral;   CESAREAN SECTION  1966; 1971; 1973   COLONOSCOPY     DEBRIDEMENT AND CLOSURE WOUND Right  07/11/2016   Procedure: DEBRIDEMENT LEFT BREAST;  Surgeon: Glenna Fellows, MD;  Location: [facility] OR;  Service: Plastics;  Laterality: Right;   EYE SURGERY     INTERSTIM IMPLANT PLACEMENT N/A 08/29/2021   Procedure: Leane Platt IMPLANT FIRST STAGE;  Surgeon: Alfredo Martinez, MD;  Location: [facility] ORS;  Service: Urology;  Laterality: N/A;   INTERSTIM IMPLANT PLACEMENT N/A 08/29/2021   Procedure: Leane Platt IMPLANT SECOND STAGE AND IMPEDANCE CHECK;  Surgeon: Alfredo Martinez, MD;  Location: [facility] OR[facility];  Service: Urology;  Laterality: N/A;   JOINT REPLACEMENT     KNEE ARTHROSCOPY Right    "before replacement"   MASS EXCISION Left 12/10/2022   Procedure: LEFT CHEST WALL MASS MARGIN EXCISION;  Surgeon: [name], MD;  Location: [facility];  Service: General;  Laterality: Left;   MASTECTOMY COMPLETE / SIMPLE Left 08/02/2016   total   RE-EXCISION OF BREAST CANCER,SUPERIOR MARGINS Left 01/10/2023   Procedure: RE-EXCISION OF LEFT CHEST WALL BREAST CANCER;  Surgeon: [name], MD;  Location: [facility] OR;  Service: General;  Laterality: Left;   REDUCTION MAMMAPLASTY Bilateral 06/25/2016   TOTAL ABDOMINAL HYSTERECTOMY W/ BILATERAL SALPINGOOPHORECTOMY     TOTAL HIP ARTHROPLASTY  06/24/2012   Procedure: TOTAL  HIP ARTHROPLASTY;  Surgeon: Loanne Drilling, MD;  Location: [facility];  Service: Orthopedics;  Laterality: Left;   TOTAL KNEE ARTHROPLASTY N/A 11/02/2013   Procedure: LEFT TOTAL KNEE ARTHROPLASTY WITH RIGHT KNEE CORTISONE INJECTION;  Surgeon: Loanne Drilling, MD;  Location: [facility];  Service: Orthopedics;  Laterality: N/A;   TOTAL KNEE ARTHROPLASTY Right 05/31/2014   Procedure: RIGHT TOTAL KNEE ARTHROPLASTY;  Surgeon: Loanne Drilling, MD;  Location: [facility];  Service: Orthopedics;  Laterality: Right;   TOTAL MASTECTOMY Left 08/02/2016   Procedure: LEFT TOTAL MASTECTOMY;  Surgeon: [name], MD;  Location: [facility] OR;  Service: General;  Laterality: Left;   TUBAL LIGATION  1973   WISDOM TOOTH EXTRACTION   1970's   admitted to hospital for surgery    FAMILY HISTORY Family History  Problem Relation Age of Onset   Hypertension Mother    Prostate cancer Father    Hypertension Father    Heart Problems Father        quadruple bypass   Hypertension Sister    The patient's mother died at age 75. The patient's father died at age 6 following a stroke. The patient had no brothers, 1 sister. There is no history of breast or ovarian cancer in the family.    GYNECOLOGIC HISTORY:  No LMP recorded. Patient has had a hysterectomy. Menarche age 29, first live birth age 75. The patient is GX P3. She underwent total abdominal hysterectomy, with bilateral salpingo-oophorectomy, in 1993. She used estrogen replacement approximately 9 months. She never used oral contraceptives.   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 Her husband Fredrik Cove used to r be in businesses but he also is retired. Son Arlys John is a Armed forces logistics/support/administrative officer in [place]. Son Casimiro Needle is a IT trainer in [employer]. Daughter Betina Gauer lives in [place]. 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 The patient has 5 grandchildren. She is a Air traffic controller, currently attending [employer]    ADVANCED DIRECTIVES: In place    HEALTH MAINTENANCE: Social History   Tobacco Use   Smoking status: Never   Smokeless tobacco: Never  Vaping Use   Vaping Use: Never used  Substance Use Topics   Alcohol use: No   Drug use: Not Currently     Colonoscopy: 2015/[facility]  PAP:  Bone density: [facility] 04[facility]17[facility]2017 that showed: T-score of -1.8.    Allergies  Allergen Reactions   Morphine And Related Other (See Comments)    hallucinations   Other Swelling and Other (See Comments)    sterling silver - swelling, pain, infection     Current Outpatient Medications  Medication Sig Dispense Refill   aspirin EC 81 MG tablet Take 81 mg by mouth daily. Swallow whole.      Calcium Carbonate Antacid (TUMS PO) Take 1 tablet by mouth as needed (heartburn).     Cholecalciferol (VITAMIN D) 125 MCG (5000 UT) CAPS Take 5,000 Units by mouth daily.     clonazePAM (KLONOPIN) 1 MG tablet Take 1 mg by mouth daily.     cyclobenzaprine (FLEXERIL) 10 MG tablet Take 20 mg by mouth at bedtime.     ferrous sulfate 325 (65 FE) MG EC tablet Take 325 mg by mouth daily.     furosemide (LASIX) 20 MG tablet Take 20 mg by mouth daily.     HYDROcodone-acetaminophen (NORCO) 10-325 MG tablet Take 1 tablet by mouth 5 (five) times daily  as needed for moderate pain.     metoprolol tartrate (LOPRESSOR) 25 MG tablet Take 25 mg by mouth 2 (two) times daily.      Multiple Vitamins-Minerals (PRESERVISION AREDS 2+MULTI VIT PO) Take 1 capsule by mouth in the morning and at bedtime.     nitrofurantoin (MACRODANTIN) 100 MG capsule Take 1 capsule (100 mg total) by mouth at bedtime. (Patient taking differently: Take 100 mg by mouth daily.)     OVER THE COUNTER MEDICATION Take 1 tablet by mouth daily. Bone up     OVER THE COUNTER MEDICATION Take 2 tablets by mouth daily as needed (constipation). Swiss Kriss     rosuvastatin (CRESTOR) 5 MG tablet Take 5 mg by mouth daily.     triamterene-hydrochlorothiazide (MAXZIDE-25) 37.5-25 MG tablet Take 1 tablet by mouth daily.     valsartan (DIOVAN) 320 MG tablet Take 320 mg by mouth daily.     venlafaxine XR (EFFEXOR-XR) 75 MG 24 hr capsule Take 1 capsule (75 mg total) by mouth 2 (two) times daily. 180 capsule 4   zolpidem (AMBIEN) 10 MG tablet Take 10 mg by mouth at bedtime.     No current facility-administered medications for this visit.   Facility-Administered Medications Ordered in Other Visits  Medication Dose Route Frequency Provider Last Rate Last Admin   methocarbamol (ROBAXIN) tablet 500 mg  500 mg Oral Q6H PRN [name], MD       metroNIDAZOLE (FLAGYL) tablet 500 mg  500 mg Oral Q8H [name], MD       ondansetron (ZOFRAN-ODT)  disintegrating tablet 4 mg  4 mg Oral Q6H PRN [name], MD       Or   ondansetron [facility]) 4 mg in sodium chloride 0.9 % 50 mL IVPB  4 mg Intravenous Q6H PRN [name], MD       oxyCODONE (Oxy IR/ROXICODONE) immediate release tablet 5-10 mg  5-10 mg Oral Q4H PRN [name], MD       simethicone [facility]) chewable tablet 40 mg  40 mg Oral Q6H PRN [name], MD        OBJECTIVE: White woman using a Rollator  There were no vitals filed for this visit.    Wt Readings from Last 3 Encounters:  01/10/23 255 lb (115.7 kg)  01/04/23 255 lb (115.7 kg)  12/10/22 255 lb (115.7 kg)   There is no height or weight on file to calculate BMI.    ECOG FS:1 - Symptomatic but completely ambulatory  She is in a wheel chair, comfortable and in no acute distress. No lower extremity edema  LAB RESULTS:  CMP     Component Value Date/Time   NA 139 01/10/2023 0620   NA 140 07/04/2017 1112   K 3.5 01/10/2023 0620   K 4.0 07/04/2017 1112   CL 98 01/10/2023 0620   CO2 28 01/10/2023 0620   CO2 28 07/04/2017 1112   GLUCOSE 163 (H) 01/10/2023 0620   GLUCOSE 106 07/04/2017 1112   BUN 22 01/10/2023 0620   BUN 26.7 (H) 07/04/2017 1112   CREATININE 0.90 01/10/2023 0620   CREATININE 0.99 08/14/2021 1120   CREATININE 1.1 07/04/2017 1112   CALCIUM 10.0 01/10/2023 0620   CALCIUM 9.8 07/04/2017 1112   PROT 6.7 01/04/2023 1149   PROT 6.9 07/04/2017 1112   ALBUMIN 3.6 01/04/2023 1149   ALBUMIN 3.8 07/04/2017 1112   AST 29 01/04/2023 1149   AST 20 08/14/2021 1120   AST 21 07/04/2017 1112   ALT  25 01/04/2023 1149   ALT 24 08/14/2021 1120   ALT 29 07/04/2017 1112   ALKPHOS 100 01/04/2023 1149   ALKPHOS 87 07/04/2017 1112   BILITOT 0.4 01/04/2023 1149   BILITOT 0.5 08/14/2021 1120   BILITOT 0.37 07/04/2017 1112   GFRNONAA >60 01/10/2023 0620   GFRNONAA 58 (L) 08/14/2021 1120   GFRAA >60 06/08/2020 0857   GFRAA 53 (L) 02/05/2018 1012    INo results found for:  "SPEP", "UPEP"  Lab Results  Component Value Date   WBC 7.9 01/04/2023   NEUTROABS 3.7 11/09/2022   HGB 13.2 01/04/2023   HCT 42.7 01/04/2023   MCV 92.0 01/04/2023   PLT 256 01/04/2023      Chemistry      Component Value Date/Time   NA 139 01/10/2023 0620   NA 140 07/04/2017 1112   K 3.5 01/10/2023 0620   K 4.0 07/04/2017 1112   CL 98 01/10/2023 0620   CO2 28 01/10/2023 0620   CO2 28 07/04/2017 1112   BUN 22 01/10/2023 0620   BUN 26.7 (H) 07/04/2017 1112   CREATININE 0.90 01/10/2023 0620   CREATININE 0.99 08/14/2021 1120   CREATININE 1.1 07/04/2017 1112   GLU 95 01/20/2016 0000      Component Value Date/Time   CALCIUM 10.0 01/10/2023 0620   CALCIUM 9.8 07/04/2017 1112   ALKPHOS 100 01/04/2023 1149   ALKPHOS 87 07/04/2017 1112   AST 29 01/04/2023 1149   AST 20 08/14/2021 1120   AST 21 07/04/2017 1112   ALT 25 01/04/2023 1149   ALT 24 08/14/2021 1120   ALT 29 07/04/2017 1112   BILITOT 0.4 01/04/2023 1149   BILITOT 0.5 08/14/2021 1120   BILITOT 0.37 07/04/2017 1112       No results found for: "LABCA2"  No components found for: "LABCA125"  No results for input(s): "INR" in the last 168 hours.  Urinalysis    Component Value Date/Time   COLORURINE YELLOW 08/29/2016 2120   APPEARANCEUR HAZY (A) 08/29/2016 2120   LABSPEC 1.014 08/29/2016 2120   PHURINE 5.0 08/29/2016 2120   GLUCOSEU NEGATIVE 08/29/2016 2120   HGBUR NEGATIVE 08/29/2016 2120   BILIRUBINUR n 01/25/2020 1101   KETONESUR NEGATIVE 08/29/2016 2120   PROTEINUR Negative 01/25/2020 1101   PROTEINUR NEGATIVE 08/29/2016 2120   UROBILINOGEN negative (A) 01/25/2020 1101   UROBILINOGEN 0.2 05/26/2014 1054   NITRITE n 01/25/2020 1101   NITRITE NEGATIVE 08/29/2016 2120   LEUKOCYTESUR Small (1+) (A) 01/25/2020 1101    STUDIES: No results found.   ELIGIBLE FOR AVAILABLE RESEARCH PROTOCOL: no  ASSESSMENT: 82 y.o. [place] woman status post left breast upper inner quadrant biopsy 05/04/2016 for a  clinical T2 N0 invasive ductal carcinoma, grade 1 estrogen and progesterone receptor positive, HER-2 nonamplified, with an MIB-1 of 10%.  (1) biopsy of a second area 05/09/2016 also in the upper inner quadrant of the left breast showed invasive ductal carcinoma, grade 2, estrogen and progesterone receptor positive, HER-2 nonamplified, with an MIB-1 of 15%.  (2) status post left lumpectomy and sentinel lymph node sampling 06/13/2016 for an mpT1c pN0(i+), stage IA invasive ductal carcinoma, grade 1, with close but negative margins  (a) status post bilateral reduction mammoplasty with left oncoplastic surgery 06/25/2016  (b) status post debridement of left nipple/areolar necrosis 07/11/2016  (c) status post left simple mastectomy 08/02/2016 showing inflammation and abscess but no evidence of malignancy  (3) Oncotype score of 18 predicts a 10 year risk of recurrence outside the breast of  11% if the patient's only systemic therapy is tamoxifen for 5 years. It also predicts no significant benefit from chemotherapy  (4) postmastectomy radiation not indicated  (5) started anastrozole September 2017, completing five years November 2022  (a) DEXA scan 04/17/2017 showed a T score of -1.8   (B DEXA scan 01/26/2020) showed a T score of -2.2.  (6) She had a dermatology appointment on December 12 and had some lesions noted which were shave biopsied from the left chest wall. Pathology from that showed dermal deposits of metastatic breast adenocarcinoma extending to the edge and base, ER +95% 3+ PR 80% 3+ HER2 2+ by IHC.  Her 2 FISH negative.  PET scan with no metastatic disease.  She was started on Faslodex for antiestrogen therapy.  PLAN: She had recent left chest wall reexcision which showed skin and underlying soft tissue involved by patient's known breast cancer.  Inked superior margin and lateral taper involved by carcinoma. She had excision, margin positive for cancer. After much review Dr Dwain Sarna  recommended considering palliative radiation. She was agreeable to this. She will meet Dr Basilio Cairo today Although there is no clear guideline suggesting role of CDK4/6 inhibitors, we discussed briefly that we can try them at the lowest dose possible if she is willing to be aggressive. She is not sure of this at this time.  We can talk later again Continue faslodex every 28 days and FU with me in 2 months.  Total time spent: 30 min  *Total Encounter Time as defined by the Centers for Medicare and Medicaid Services includes, in addition to the face-to-face time of a patient visit (documented in the note above) non-face-to-face time: obtaining and reviewing outside history, ordering and reviewing medications, tests or procedures, care coordination (communications with other health care professionals or caregivers) and documentation in the medical record.

## 2023-01-19 ENCOUNTER — Encounter: Payer: Self-pay | Admitting: Radiation Oncology

## 2023-01-19 DIAGNOSIS — C50112 Malignant neoplasm of central portion of left female breast: Secondary | ICD-10-CM | POA: Insufficient documentation

## 2023-01-21 ENCOUNTER — Telehealth: Payer: Self-pay | Admitting: Hematology and Oncology

## 2023-01-21 DIAGNOSIS — M150 Primary generalized (osteo)arthritis: Secondary | ICD-10-CM | POA: Diagnosis not present

## 2023-01-21 DIAGNOSIS — I872 Venous insufficiency (chronic) (peripheral): Secondary | ICD-10-CM | POA: Diagnosis not present

## 2023-01-21 DIAGNOSIS — J45909 Unspecified asthma, uncomplicated: Secondary | ICD-10-CM | POA: Diagnosis not present

## 2023-01-21 DIAGNOSIS — Z853 Personal history of malignant neoplasm of breast: Secondary | ICD-10-CM | POA: Diagnosis not present

## 2023-01-21 DIAGNOSIS — F32A Depression, unspecified: Secondary | ICD-10-CM | POA: Diagnosis not present

## 2023-01-21 DIAGNOSIS — E559 Vitamin D deficiency, unspecified: Secondary | ICD-10-CM | POA: Diagnosis not present

## 2023-01-21 DIAGNOSIS — G894 Chronic pain syndrome: Secondary | ICD-10-CM | POA: Diagnosis not present

## 2023-01-21 DIAGNOSIS — F5101 Primary insomnia: Secondary | ICD-10-CM | POA: Diagnosis not present

## 2023-01-21 DIAGNOSIS — M109 Gout, unspecified: Secondary | ICD-10-CM | POA: Diagnosis not present

## 2023-01-21 DIAGNOSIS — Z7982 Long term (current) use of aspirin: Secondary | ICD-10-CM | POA: Diagnosis not present

## 2023-01-21 DIAGNOSIS — D649 Anemia, unspecified: Secondary | ICD-10-CM | POA: Diagnosis not present

## 2023-01-21 DIAGNOSIS — Z87440 Personal history of urinary (tract) infections: Secondary | ICD-10-CM | POA: Diagnosis not present

## 2023-01-21 DIAGNOSIS — I4891 Unspecified atrial fibrillation: Secondary | ICD-10-CM | POA: Diagnosis not present

## 2023-01-21 DIAGNOSIS — I4510 Unspecified right bundle-branch block: Secondary | ICD-10-CM | POA: Diagnosis not present

## 2023-01-21 DIAGNOSIS — M549 Dorsalgia, unspecified: Secondary | ICD-10-CM | POA: Diagnosis not present

## 2023-01-21 DIAGNOSIS — E7800 Pure hypercholesterolemia, unspecified: Secondary | ICD-10-CM | POA: Diagnosis not present

## 2023-01-21 DIAGNOSIS — I10 Essential (primary) hypertension: Secondary | ICD-10-CM | POA: Diagnosis not present

## 2023-01-21 DIAGNOSIS — Z79891 Long term (current) use of opiate analgesic: Secondary | ICD-10-CM | POA: Diagnosis not present

## 2023-01-21 NOTE — Telephone Encounter (Signed)
Spoke with patient husband confirming upcoming appointments  

## 2023-01-28 ENCOUNTER — Ambulatory Visit: Payer: Medicare Other | Admitting: Radiation Oncology

## 2023-01-28 ENCOUNTER — Telehealth: Payer: Self-pay | Admitting: Radiation Oncology

## 2023-01-28 DIAGNOSIS — Z510 Encounter for antineoplastic radiation therapy: Secondary | ICD-10-CM | POA: Insufficient documentation

## 2023-01-28 DIAGNOSIS — C50112 Malignant neoplasm of central portion of left female breast: Secondary | ICD-10-CM | POA: Insufficient documentation

## 2023-01-28 NOTE — Telephone Encounter (Signed)
5/13 @ 2:00 pm patient's husband called to confirm date/time when patient starts her treatment due to being push out a week later, because of surgical sutures coming out -per Dr. Dwain Sarna.  Date/time given for 5/22 @ 12:15 pm, according to [facility].  Patient 's husband is reminder to request new printout schedule at the time of appt.

## 2023-01-30 DIAGNOSIS — R7303 Prediabetes: Secondary | ICD-10-CM | POA: Diagnosis not present

## 2023-01-30 DIAGNOSIS — Z7409 Other reduced mobility: Secondary | ICD-10-CM | POA: Diagnosis not present

## 2023-01-30 DIAGNOSIS — E7800 Pure hypercholesterolemia, unspecified: Secondary | ICD-10-CM | POA: Diagnosis not present

## 2023-01-30 DIAGNOSIS — C50919 Malignant neoplasm of unspecified site of unspecified female breast: Secondary | ICD-10-CM | POA: Diagnosis not present

## 2023-01-30 DIAGNOSIS — I10 Essential (primary) hypertension: Secondary | ICD-10-CM | POA: Diagnosis not present

## 2023-01-30 DIAGNOSIS — C799 Secondary malignant neoplasm of unspecified site: Secondary | ICD-10-CM | POA: Diagnosis not present

## 2023-01-30 DIAGNOSIS — G894 Chronic pain syndrome: Secondary | ICD-10-CM | POA: Diagnosis not present

## 2023-01-30 DIAGNOSIS — H6123 Impacted cerumen, bilateral: Secondary | ICD-10-CM | POA: Diagnosis not present

## 2023-01-30 DIAGNOSIS — N390 Urinary tract infection, site not specified: Secondary | ICD-10-CM | POA: Diagnosis not present

## 2023-01-30 DIAGNOSIS — F5101 Primary insomnia: Secondary | ICD-10-CM | POA: Diagnosis not present

## 2023-01-31 DIAGNOSIS — C50212 Malignant neoplasm of upper-inner quadrant of left female breast: Secondary | ICD-10-CM | POA: Diagnosis not present

## 2023-01-31 DIAGNOSIS — C792 Secondary malignant neoplasm of skin: Secondary | ICD-10-CM | POA: Diagnosis not present

## 2023-02-01 ENCOUNTER — Ambulatory Visit
Admission: RE | Admit: 2023-02-01 | Discharge: 2023-02-01 | Disposition: A | Discharge status: Home / Self Care | Payer: Medicare Other | Source: Ambulatory Visit | Attending: Radiation Oncology | Admitting: Radiation Oncology

## 2023-02-01 DIAGNOSIS — Z170 Estrogen receptor positive status [ER+]: Secondary | ICD-10-CM | POA: Diagnosis not present

## 2023-02-01 DIAGNOSIS — Z510 Encounter for antineoplastic radiation therapy: Secondary | ICD-10-CM | POA: Diagnosis not present

## 2023-02-01 DIAGNOSIS — C50112 Malignant neoplasm of central portion of left female breast: Secondary | ICD-10-CM | POA: Diagnosis not present

## 2023-02-04 DIAGNOSIS — J45909 Unspecified asthma, uncomplicated: Secondary | ICD-10-CM | POA: Diagnosis not present

## 2023-02-04 DIAGNOSIS — Z7982 Long term (current) use of aspirin: Secondary | ICD-10-CM | POA: Diagnosis not present

## 2023-02-04 DIAGNOSIS — G894 Chronic pain syndrome: Secondary | ICD-10-CM | POA: Diagnosis not present

## 2023-02-04 DIAGNOSIS — Z853 Personal history of malignant neoplasm of breast: Secondary | ICD-10-CM | POA: Diagnosis not present

## 2023-02-04 DIAGNOSIS — I4510 Unspecified right bundle-branch block: Secondary | ICD-10-CM | POA: Diagnosis not present

## 2023-02-04 DIAGNOSIS — M549 Dorsalgia, unspecified: Secondary | ICD-10-CM | POA: Diagnosis not present

## 2023-02-04 DIAGNOSIS — I10 Essential (primary) hypertension: Secondary | ICD-10-CM | POA: Diagnosis not present

## 2023-02-04 DIAGNOSIS — Z79891 Long term (current) use of opiate analgesic: Secondary | ICD-10-CM | POA: Diagnosis not present

## 2023-02-04 DIAGNOSIS — E7800 Pure hypercholesterolemia, unspecified: Secondary | ICD-10-CM | POA: Diagnosis not present

## 2023-02-04 DIAGNOSIS — F5101 Primary insomnia: Secondary | ICD-10-CM | POA: Diagnosis not present

## 2023-02-04 DIAGNOSIS — D649 Anemia, unspecified: Secondary | ICD-10-CM | POA: Diagnosis not present

## 2023-02-04 DIAGNOSIS — M109 Gout, unspecified: Secondary | ICD-10-CM | POA: Diagnosis not present

## 2023-02-04 DIAGNOSIS — E559 Vitamin D deficiency, unspecified: Secondary | ICD-10-CM | POA: Diagnosis not present

## 2023-02-04 DIAGNOSIS — I4891 Unspecified atrial fibrillation: Secondary | ICD-10-CM | POA: Diagnosis not present

## 2023-02-04 DIAGNOSIS — Z87440 Personal history of urinary (tract) infections: Secondary | ICD-10-CM | POA: Diagnosis not present

## 2023-02-04 DIAGNOSIS — F32A Depression, unspecified: Secondary | ICD-10-CM | POA: Diagnosis not present

## 2023-02-04 DIAGNOSIS — I872 Venous insufficiency (chronic) (peripheral): Secondary | ICD-10-CM | POA: Diagnosis not present

## 2023-02-04 DIAGNOSIS — M150 Primary generalized (osteo)arthritis: Secondary | ICD-10-CM | POA: Diagnosis not present

## 2023-02-05 DIAGNOSIS — G894 Chronic pain syndrome: Secondary | ICD-10-CM | POA: Diagnosis not present

## 2023-02-05 DIAGNOSIS — M109 Gout, unspecified: Secondary | ICD-10-CM | POA: Diagnosis not present

## 2023-02-05 DIAGNOSIS — Z87440 Personal history of urinary (tract) infections: Secondary | ICD-10-CM | POA: Diagnosis not present

## 2023-02-05 DIAGNOSIS — Z7982 Long term (current) use of aspirin: Secondary | ICD-10-CM | POA: Diagnosis not present

## 2023-02-05 DIAGNOSIS — J45909 Unspecified asthma, uncomplicated: Secondary | ICD-10-CM | POA: Diagnosis not present

## 2023-02-05 DIAGNOSIS — Z853 Personal history of malignant neoplasm of breast: Secondary | ICD-10-CM | POA: Diagnosis not present

## 2023-02-05 DIAGNOSIS — I4510 Unspecified right bundle-branch block: Secondary | ICD-10-CM | POA: Diagnosis not present

## 2023-02-05 DIAGNOSIS — Z79891 Long term (current) use of opiate analgesic: Secondary | ICD-10-CM | POA: Diagnosis not present

## 2023-02-05 DIAGNOSIS — Z170 Estrogen receptor positive status [ER+]: Secondary | ICD-10-CM | POA: Diagnosis not present

## 2023-02-05 DIAGNOSIS — Z510 Encounter for antineoplastic radiation therapy: Secondary | ICD-10-CM | POA: Diagnosis not present

## 2023-02-05 DIAGNOSIS — E559 Vitamin D deficiency, unspecified: Secondary | ICD-10-CM | POA: Diagnosis not present

## 2023-02-05 DIAGNOSIS — I10 Essential (primary) hypertension: Secondary | ICD-10-CM | POA: Diagnosis not present

## 2023-02-05 DIAGNOSIS — F32A Depression, unspecified: Secondary | ICD-10-CM | POA: Diagnosis not present

## 2023-02-05 DIAGNOSIS — F5101 Primary insomnia: Secondary | ICD-10-CM | POA: Diagnosis not present

## 2023-02-05 DIAGNOSIS — C50112 Malignant neoplasm of central portion of left female breast: Secondary | ICD-10-CM | POA: Diagnosis not present

## 2023-02-05 DIAGNOSIS — M150 Primary generalized (osteo)arthritis: Secondary | ICD-10-CM | POA: Diagnosis not present

## 2023-02-05 DIAGNOSIS — I4891 Unspecified atrial fibrillation: Secondary | ICD-10-CM | POA: Diagnosis not present

## 2023-02-05 DIAGNOSIS — D649 Anemia, unspecified: Secondary | ICD-10-CM | POA: Diagnosis not present

## 2023-02-05 DIAGNOSIS — E7800 Pure hypercholesterolemia, unspecified: Secondary | ICD-10-CM | POA: Diagnosis not present

## 2023-02-05 DIAGNOSIS — M549 Dorsalgia, unspecified: Secondary | ICD-10-CM | POA: Diagnosis not present

## 2023-02-05 DIAGNOSIS — I872 Venous insufficiency (chronic) (peripheral): Secondary | ICD-10-CM | POA: Diagnosis not present

## 2023-02-06 ENCOUNTER — Other Ambulatory Visit: Payer: Self-pay

## 2023-02-06 ENCOUNTER — Ambulatory Visit
Admission: RE | Admit: 2023-02-06 | Discharge: 2023-02-06 | Disposition: A | Discharge status: Home / Self Care | Payer: Medicare Other | Source: Ambulatory Visit | Attending: Radiation Oncology | Admitting: Radiation Oncology

## 2023-02-06 ENCOUNTER — Telehealth: Payer: Self-pay

## 2023-02-06 DIAGNOSIS — Z510 Encounter for antineoplastic radiation therapy: Secondary | ICD-10-CM | POA: Diagnosis not present

## 2023-02-06 DIAGNOSIS — Z170 Estrogen receptor positive status [ER+]: Secondary | ICD-10-CM | POA: Diagnosis not present

## 2023-02-06 DIAGNOSIS — C50112 Malignant neoplasm of central portion of left female breast: Secondary | ICD-10-CM | POA: Diagnosis not present

## 2023-02-06 LAB — RAD ONC ARIA SESSION SUMMARY
Course Elapsed Days: 0
Plan Fractions Treated to Date: 1
Plan Prescribed Dose Per Fraction: 2.67 Gy
Plan Total Fractions Prescribed: 8
Plan Total Prescribed Dose: 21.36 Gy
Reference Point Dosage Given to Date: 2.67 Gy
Reference Point Session Dosage Given: 2.67 Gy
Session Number: 1

## 2023-02-06 NOTE — Telephone Encounter (Signed)
Rn called Urology office back again after no fax had been sent back pertaining to pt bladder stimulator and radiation treatment (that is supposed to start today). Form was faxed over to office this am before 9am. Office did verify receipt and stated that Dr. Sherron Monday had this form. Rn requested a stat message be sent to provider to sign and have faxed to our office staff. Rn will continue to monitor and follow up.

## 2023-02-07 ENCOUNTER — Telehealth: Payer: Self-pay

## 2023-02-07 ENCOUNTER — Ambulatory Visit
Admission: RE | Admit: 2023-02-07 | Discharge: 2023-02-07 | Disposition: A | Discharge status: Home / Self Care | Payer: Medicare Other | Source: Ambulatory Visit | Attending: Radiation Oncology | Admitting: Radiation Oncology

## 2023-02-07 ENCOUNTER — Other Ambulatory Visit: Payer: Self-pay

## 2023-02-07 DIAGNOSIS — I4891 Unspecified atrial fibrillation: Secondary | ICD-10-CM | POA: Diagnosis not present

## 2023-02-07 DIAGNOSIS — Z7982 Long term (current) use of aspirin: Secondary | ICD-10-CM | POA: Diagnosis not present

## 2023-02-07 DIAGNOSIS — I4510 Unspecified right bundle-branch block: Secondary | ICD-10-CM | POA: Diagnosis not present

## 2023-02-07 DIAGNOSIS — F5101 Primary insomnia: Secondary | ICD-10-CM | POA: Diagnosis not present

## 2023-02-07 DIAGNOSIS — C50112 Malignant neoplasm of central portion of left female breast: Secondary | ICD-10-CM | POA: Diagnosis not present

## 2023-02-07 DIAGNOSIS — Z79891 Long term (current) use of opiate analgesic: Secondary | ICD-10-CM | POA: Diagnosis not present

## 2023-02-07 DIAGNOSIS — M150 Primary generalized (osteo)arthritis: Secondary | ICD-10-CM | POA: Diagnosis not present

## 2023-02-07 DIAGNOSIS — M109 Gout, unspecified: Secondary | ICD-10-CM | POA: Diagnosis not present

## 2023-02-07 DIAGNOSIS — Z510 Encounter for antineoplastic radiation therapy: Secondary | ICD-10-CM | POA: Diagnosis not present

## 2023-02-07 DIAGNOSIS — G894 Chronic pain syndrome: Secondary | ICD-10-CM | POA: Diagnosis not present

## 2023-02-07 DIAGNOSIS — Z170 Estrogen receptor positive status [ER+]: Secondary | ICD-10-CM | POA: Diagnosis not present

## 2023-02-07 DIAGNOSIS — I10 Essential (primary) hypertension: Secondary | ICD-10-CM | POA: Diagnosis not present

## 2023-02-07 DIAGNOSIS — J45909 Unspecified asthma, uncomplicated: Secondary | ICD-10-CM | POA: Diagnosis not present

## 2023-02-07 DIAGNOSIS — E559 Vitamin D deficiency, unspecified: Secondary | ICD-10-CM | POA: Diagnosis not present

## 2023-02-07 DIAGNOSIS — Z853 Personal history of malignant neoplasm of breast: Secondary | ICD-10-CM | POA: Diagnosis not present

## 2023-02-07 DIAGNOSIS — M549 Dorsalgia, unspecified: Secondary | ICD-10-CM | POA: Diagnosis not present

## 2023-02-07 DIAGNOSIS — D649 Anemia, unspecified: Secondary | ICD-10-CM | POA: Diagnosis not present

## 2023-02-07 DIAGNOSIS — I872 Venous insufficiency (chronic) (peripheral): Secondary | ICD-10-CM | POA: Diagnosis not present

## 2023-02-07 DIAGNOSIS — F32A Depression, unspecified: Secondary | ICD-10-CM | POA: Diagnosis not present

## 2023-02-07 DIAGNOSIS — E7800 Pure hypercholesterolemia, unspecified: Secondary | ICD-10-CM | POA: Diagnosis not present

## 2023-02-07 DIAGNOSIS — Z87440 Personal history of urinary (tract) infections: Secondary | ICD-10-CM | POA: Diagnosis not present

## 2023-02-07 LAB — RAD ONC ARIA SESSION SUMMARY
Course Elapsed Days: 1
Plan Fractions Treated to Date: 1
Plan Prescribed Dose Per Fraction: 2.67 Gy
Plan Total Fractions Prescribed: 7
Plan Total Prescribed Dose: 18.69 Gy
Reference Point Dosage Given to Date: 2.67 Gy
Reference Point Session Dosage Given: 2.67 Gy
Session Number: 2

## 2023-02-07 NOTE — Telephone Encounter (Signed)
Rn called Urology office to check on status of medical equipment form sent for this pt. Medical records once again confirmed receipt and stated that the md still had the form. They were going to get a message to his nurse. Rn will continue to follow up.

## 2023-02-08 ENCOUNTER — Other Ambulatory Visit: Payer: Self-pay

## 2023-02-08 ENCOUNTER — Ambulatory Visit
Admission: RE | Admit: 2023-02-08 | Discharge: 2023-02-08 | Disposition: A | Discharge status: Home / Self Care | Payer: Medicare Other | Source: Ambulatory Visit | Attending: Radiation Oncology | Admitting: Radiation Oncology

## 2023-02-08 DIAGNOSIS — Z510 Encounter for antineoplastic radiation therapy: Secondary | ICD-10-CM | POA: Diagnosis not present

## 2023-02-08 DIAGNOSIS — C50112 Malignant neoplasm of central portion of left female breast: Secondary | ICD-10-CM | POA: Diagnosis not present

## 2023-02-08 LAB — RAD ONC ARIA SESSION SUMMARY
Course Elapsed Days: 2
Plan Fractions Treated to Date: 2
Plan Prescribed Dose Per Fraction: 2.67 Gy
Plan Total Fractions Prescribed: 8
Plan Total Prescribed Dose: 21.36 Gy
Reference Point Dosage Given to Date: 5.34 Gy
Reference Point Session Dosage Given: 2.67 Gy
Session Number: 3

## 2023-02-10 DIAGNOSIS — J45909 Unspecified asthma, uncomplicated: Secondary | ICD-10-CM | POA: Diagnosis not present

## 2023-02-10 DIAGNOSIS — I4891 Unspecified atrial fibrillation: Secondary | ICD-10-CM | POA: Diagnosis not present

## 2023-02-10 DIAGNOSIS — M109 Gout, unspecified: Secondary | ICD-10-CM | POA: Diagnosis not present

## 2023-02-10 DIAGNOSIS — M549 Dorsalgia, unspecified: Secondary | ICD-10-CM | POA: Diagnosis not present

## 2023-02-10 DIAGNOSIS — E7800 Pure hypercholesterolemia, unspecified: Secondary | ICD-10-CM | POA: Diagnosis not present

## 2023-02-10 DIAGNOSIS — G894 Chronic pain syndrome: Secondary | ICD-10-CM | POA: Diagnosis not present

## 2023-02-10 DIAGNOSIS — F32A Depression, unspecified: Secondary | ICD-10-CM | POA: Diagnosis not present

## 2023-02-10 DIAGNOSIS — I10 Essential (primary) hypertension: Secondary | ICD-10-CM | POA: Diagnosis not present

## 2023-02-10 DIAGNOSIS — Z79891 Long term (current) use of opiate analgesic: Secondary | ICD-10-CM | POA: Diagnosis not present

## 2023-02-10 DIAGNOSIS — D649 Anemia, unspecified: Secondary | ICD-10-CM | POA: Diagnosis not present

## 2023-02-10 DIAGNOSIS — M150 Primary generalized (osteo)arthritis: Secondary | ICD-10-CM | POA: Diagnosis not present

## 2023-02-10 DIAGNOSIS — E559 Vitamin D deficiency, unspecified: Secondary | ICD-10-CM | POA: Diagnosis not present

## 2023-02-10 DIAGNOSIS — I872 Venous insufficiency (chronic) (peripheral): Secondary | ICD-10-CM | POA: Diagnosis not present

## 2023-02-10 DIAGNOSIS — Z853 Personal history of malignant neoplasm of breast: Secondary | ICD-10-CM | POA: Diagnosis not present

## 2023-02-10 DIAGNOSIS — Z7982 Long term (current) use of aspirin: Secondary | ICD-10-CM | POA: Diagnosis not present

## 2023-02-10 DIAGNOSIS — Z87440 Personal history of urinary (tract) infections: Secondary | ICD-10-CM | POA: Diagnosis not present

## 2023-02-10 DIAGNOSIS — I4510 Unspecified right bundle-branch block: Secondary | ICD-10-CM | POA: Diagnosis not present

## 2023-02-10 DIAGNOSIS — F5101 Primary insomnia: Secondary | ICD-10-CM | POA: Diagnosis not present

## 2023-02-12 ENCOUNTER — Ambulatory Visit
Admission: RE | Admit: 2023-02-12 | Discharge: 2023-02-12 | Disposition: A | Discharge status: Home / Self Care | Payer: Medicare Other | Source: Ambulatory Visit | Attending: Radiation Oncology | Admitting: Radiation Oncology

## 2023-02-12 ENCOUNTER — Other Ambulatory Visit: Payer: Self-pay

## 2023-02-12 DIAGNOSIS — M549 Dorsalgia, unspecified: Secondary | ICD-10-CM | POA: Diagnosis not present

## 2023-02-12 DIAGNOSIS — Z170 Estrogen receptor positive status [ER+]: Secondary | ICD-10-CM

## 2023-02-12 DIAGNOSIS — F5101 Primary insomnia: Secondary | ICD-10-CM | POA: Diagnosis not present

## 2023-02-12 DIAGNOSIS — C50112 Malignant neoplasm of central portion of left female breast: Secondary | ICD-10-CM | POA: Diagnosis not present

## 2023-02-12 DIAGNOSIS — I4510 Unspecified right bundle-branch block: Secondary | ICD-10-CM | POA: Diagnosis not present

## 2023-02-12 DIAGNOSIS — J45909 Unspecified asthma, uncomplicated: Secondary | ICD-10-CM | POA: Diagnosis not present

## 2023-02-12 DIAGNOSIS — M150 Primary generalized (osteo)arthritis: Secondary | ICD-10-CM | POA: Diagnosis not present

## 2023-02-12 DIAGNOSIS — I872 Venous insufficiency (chronic) (peripheral): Secondary | ICD-10-CM | POA: Diagnosis not present

## 2023-02-12 DIAGNOSIS — Z7982 Long term (current) use of aspirin: Secondary | ICD-10-CM | POA: Diagnosis not present

## 2023-02-12 DIAGNOSIS — Z79891 Long term (current) use of opiate analgesic: Secondary | ICD-10-CM | POA: Diagnosis not present

## 2023-02-12 DIAGNOSIS — G894 Chronic pain syndrome: Secondary | ICD-10-CM | POA: Diagnosis not present

## 2023-02-12 DIAGNOSIS — E7800 Pure hypercholesterolemia, unspecified: Secondary | ICD-10-CM | POA: Diagnosis not present

## 2023-02-12 DIAGNOSIS — Z510 Encounter for antineoplastic radiation therapy: Secondary | ICD-10-CM | POA: Diagnosis not present

## 2023-02-12 DIAGNOSIS — E559 Vitamin D deficiency, unspecified: Secondary | ICD-10-CM | POA: Diagnosis not present

## 2023-02-12 DIAGNOSIS — M109 Gout, unspecified: Secondary | ICD-10-CM | POA: Diagnosis not present

## 2023-02-12 DIAGNOSIS — F32A Depression, unspecified: Secondary | ICD-10-CM | POA: Diagnosis not present

## 2023-02-12 DIAGNOSIS — Z853 Personal history of malignant neoplasm of breast: Secondary | ICD-10-CM | POA: Diagnosis not present

## 2023-02-12 DIAGNOSIS — D649 Anemia, unspecified: Secondary | ICD-10-CM | POA: Diagnosis not present

## 2023-02-12 DIAGNOSIS — I4891 Unspecified atrial fibrillation: Secondary | ICD-10-CM | POA: Diagnosis not present

## 2023-02-12 DIAGNOSIS — Z87440 Personal history of urinary (tract) infections: Secondary | ICD-10-CM | POA: Diagnosis not present

## 2023-02-12 DIAGNOSIS — I10 Essential (primary) hypertension: Secondary | ICD-10-CM | POA: Diagnosis not present

## 2023-02-12 LAB — RAD ONC ARIA SESSION SUMMARY
Course Elapsed Days: 6
Plan Fractions Treated to Date: 2
Plan Prescribed Dose Per Fraction: 2.67 Gy
Plan Total Fractions Prescribed: 7
Plan Total Prescribed Dose: 18.69 Gy
Reference Point Dosage Given to Date: 5.34 Gy
Reference Point Session Dosage Given: 2.67 Gy
Session Number: 4

## 2023-02-12 MED ORDER — RADIAPLEXRX EX GEL: Freq: Once | CUTANEOUS | Status: AC

## 2023-02-12 MED ORDER — ALRA NON-METALLIC DEODORANT (RAD-ONC)
1.0000 | Freq: Once | TOPICAL | Status: AC
  Administered 2023-02-12: 1 via TOPICAL

## 2023-02-13 ENCOUNTER — Other Ambulatory Visit: Payer: Self-pay

## 2023-02-13 ENCOUNTER — Ambulatory Visit
Admission: RE | Admit: 2023-02-13 | Discharge: 2023-02-13 | Disposition: A | Discharge status: Home / Self Care | Payer: Medicare Other | Source: Ambulatory Visit | Attending: Radiation Oncology | Admitting: Radiation Oncology

## 2023-02-13 DIAGNOSIS — Z510 Encounter for antineoplastic radiation therapy: Secondary | ICD-10-CM | POA: Diagnosis not present

## 2023-02-13 DIAGNOSIS — Z170 Estrogen receptor positive status [ER+]: Secondary | ICD-10-CM | POA: Diagnosis not present

## 2023-02-13 DIAGNOSIS — C50112 Malignant neoplasm of central portion of left female breast: Secondary | ICD-10-CM | POA: Diagnosis not present

## 2023-02-13 LAB — RAD ONC ARIA SESSION SUMMARY
Course Elapsed Days: 7
Plan Fractions Treated to Date: 3
Plan Prescribed Dose Per Fraction: 2.67 Gy
Plan Total Fractions Prescribed: 8
Plan Total Prescribed Dose: 21.36 Gy
Reference Point Dosage Given to Date: 8.01 Gy
Reference Point Session Dosage Given: 2.67 Gy
Session Number: 5

## 2023-02-14 ENCOUNTER — Other Ambulatory Visit: Payer: Self-pay

## 2023-02-14 ENCOUNTER — Ambulatory Visit
Admission: RE | Admit: 2023-02-14 | Discharge: 2023-02-14 | Disposition: A | Discharge status: Home / Self Care | Payer: Medicare Other | Source: Ambulatory Visit | Attending: Radiation Oncology | Admitting: Radiation Oncology

## 2023-02-14 DIAGNOSIS — Z170 Estrogen receptor positive status [ER+]: Secondary | ICD-10-CM | POA: Diagnosis not present

## 2023-02-14 DIAGNOSIS — Z510 Encounter for antineoplastic radiation therapy: Secondary | ICD-10-CM | POA: Diagnosis not present

## 2023-02-14 DIAGNOSIS — C50112 Malignant neoplasm of central portion of left female breast: Secondary | ICD-10-CM | POA: Diagnosis not present

## 2023-02-14 LAB — RAD ONC ARIA SESSION SUMMARY
Course Elapsed Days: 8
Plan Fractions Treated to Date: 3
Plan Prescribed Dose Per Fraction: 2.67 Gy
Plan Total Fractions Prescribed: 7
Plan Total Prescribed Dose: 18.69 Gy
Reference Point Dosage Given to Date: 8.01 Gy
Reference Point Session Dosage Given: 2.67 Gy
Session Number: 6

## 2023-02-15 ENCOUNTER — Ambulatory Visit
Admission: RE | Admit: 2023-02-15 | Discharge: 2023-02-15 | Disposition: A | Discharge status: Home / Self Care | Payer: Medicare Other | Source: Ambulatory Visit | Attending: Radiation Oncology | Admitting: Radiation Oncology

## 2023-02-15 ENCOUNTER — Inpatient Hospital Stay: Payer: Medicare Other

## 2023-02-15 ENCOUNTER — Other Ambulatory Visit: Payer: Self-pay

## 2023-02-15 DIAGNOSIS — Z510 Encounter for antineoplastic radiation therapy: Secondary | ICD-10-CM | POA: Diagnosis not present

## 2023-02-15 DIAGNOSIS — C50212 Malignant neoplasm of upper-inner quadrant of left female breast: Secondary | ICD-10-CM

## 2023-02-15 DIAGNOSIS — C50112 Malignant neoplasm of central portion of left female breast: Secondary | ICD-10-CM | POA: Diagnosis not present

## 2023-02-15 DIAGNOSIS — Z170 Estrogen receptor positive status [ER+]: Secondary | ICD-10-CM | POA: Diagnosis not present

## 2023-02-15 LAB — RAD ONC ARIA SESSION SUMMARY
Course Elapsed Days: 9
Plan Fractions Treated to Date: 4
Plan Prescribed Dose Per Fraction: 2.67 Gy
Plan Total Fractions Prescribed: 8
Plan Total Prescribed Dose: 21.36 Gy
Reference Point Dosage Given to Date: 10.68 Gy
Reference Point Session Dosage Given: 2.67 Gy
Session Number: 7

## 2023-02-15 MED ORDER — FULVESTRANT 250 MG/5ML IM SOSY
500.0000 mg | PREFILLED_SYRINGE | Freq: Once | INTRAMUSCULAR | Status: AC
  Administered 2023-02-15: 500 mg via INTRAMUSCULAR
  Filled 2023-02-15: qty 10

## 2023-02-18 ENCOUNTER — Other Ambulatory Visit: Payer: Self-pay

## 2023-02-18 ENCOUNTER — Ambulatory Visit
Admission: RE | Admit: 2023-02-18 | Discharge: 2023-02-18 | Disposition: A | Discharge status: Home / Self Care | Payer: Medicare Other | Source: Ambulatory Visit | Attending: Radiation Oncology | Admitting: Radiation Oncology

## 2023-02-18 ENCOUNTER — Ambulatory Visit: Payer: Medicare Other

## 2023-02-18 DIAGNOSIS — C50112 Malignant neoplasm of central portion of left female breast: Secondary | ICD-10-CM | POA: Insufficient documentation

## 2023-02-18 DIAGNOSIS — Z170 Estrogen receptor positive status [ER+]: Secondary | ICD-10-CM | POA: Diagnosis not present

## 2023-02-18 DIAGNOSIS — Z510 Encounter for antineoplastic radiation therapy: Secondary | ICD-10-CM | POA: Insufficient documentation

## 2023-02-18 LAB — RAD ONC ARIA SESSION SUMMARY
Course Elapsed Days: 12
Plan Fractions Treated to Date: 4
Plan Prescribed Dose Per Fraction: 2.67 Gy
Plan Total Fractions Prescribed: 7
Plan Total Prescribed Dose: 18.69 Gy
Reference Point Dosage Given to Date: 10.68 Gy
Reference Point Session Dosage Given: 2.67 Gy
Session Number: 8

## 2023-02-18 MED ORDER — RADIAPLEXRX EX GEL
Freq: Once | CUTANEOUS | Status: AC
  Administered 2023-02-18: 1 via TOPICAL

## 2023-02-19 ENCOUNTER — Other Ambulatory Visit: Payer: Self-pay

## 2023-02-19 ENCOUNTER — Ambulatory Visit
Admission: RE | Admit: 2023-02-19 | Discharge: 2023-02-19 | Disposition: A | Discharge status: Home / Self Care | Payer: Medicare Other | Source: Ambulatory Visit | Attending: Radiation Oncology | Admitting: Radiation Oncology

## 2023-02-19 DIAGNOSIS — E7800 Pure hypercholesterolemia, unspecified: Secondary | ICD-10-CM | POA: Diagnosis not present

## 2023-02-19 DIAGNOSIS — Z853 Personal history of malignant neoplasm of breast: Secondary | ICD-10-CM | POA: Diagnosis not present

## 2023-02-19 DIAGNOSIS — J45909 Unspecified asthma, uncomplicated: Secondary | ICD-10-CM | POA: Diagnosis not present

## 2023-02-19 DIAGNOSIS — I872 Venous insufficiency (chronic) (peripheral): Secondary | ICD-10-CM | POA: Diagnosis not present

## 2023-02-19 DIAGNOSIS — G894 Chronic pain syndrome: Secondary | ICD-10-CM | POA: Diagnosis not present

## 2023-02-19 DIAGNOSIS — C50112 Malignant neoplasm of central portion of left female breast: Secondary | ICD-10-CM | POA: Diagnosis not present

## 2023-02-19 DIAGNOSIS — Z87440 Personal history of urinary (tract) infections: Secondary | ICD-10-CM | POA: Diagnosis not present

## 2023-02-19 DIAGNOSIS — I4510 Unspecified right bundle-branch block: Secondary | ICD-10-CM | POA: Diagnosis not present

## 2023-02-19 DIAGNOSIS — I10 Essential (primary) hypertension: Secondary | ICD-10-CM | POA: Diagnosis not present

## 2023-02-19 DIAGNOSIS — M109 Gout, unspecified: Secondary | ICD-10-CM | POA: Diagnosis not present

## 2023-02-19 DIAGNOSIS — Z170 Estrogen receptor positive status [ER+]: Secondary | ICD-10-CM | POA: Diagnosis not present

## 2023-02-19 DIAGNOSIS — M150 Primary generalized (osteo)arthritis: Secondary | ICD-10-CM | POA: Diagnosis not present

## 2023-02-19 DIAGNOSIS — F5101 Primary insomnia: Secondary | ICD-10-CM | POA: Diagnosis not present

## 2023-02-19 DIAGNOSIS — D649 Anemia, unspecified: Secondary | ICD-10-CM | POA: Diagnosis not present

## 2023-02-19 DIAGNOSIS — M549 Dorsalgia, unspecified: Secondary | ICD-10-CM | POA: Diagnosis not present

## 2023-02-19 DIAGNOSIS — I4891 Unspecified atrial fibrillation: Secondary | ICD-10-CM | POA: Diagnosis not present

## 2023-02-19 DIAGNOSIS — Z7982 Long term (current) use of aspirin: Secondary | ICD-10-CM | POA: Diagnosis not present

## 2023-02-19 DIAGNOSIS — Z510 Encounter for antineoplastic radiation therapy: Secondary | ICD-10-CM | POA: Diagnosis not present

## 2023-02-19 DIAGNOSIS — F32A Depression, unspecified: Secondary | ICD-10-CM | POA: Diagnosis not present

## 2023-02-19 DIAGNOSIS — E559 Vitamin D deficiency, unspecified: Secondary | ICD-10-CM | POA: Diagnosis not present

## 2023-02-19 DIAGNOSIS — Z79891 Long term (current) use of opiate analgesic: Secondary | ICD-10-CM | POA: Diagnosis not present

## 2023-02-19 LAB — RAD ONC ARIA SESSION SUMMARY
Course Elapsed Days: 13
Plan Fractions Treated to Date: 5
Plan Prescribed Dose Per Fraction: 2.67 Gy
Plan Total Fractions Prescribed: 8
Plan Total Prescribed Dose: 21.36 Gy
Reference Point Dosage Given to Date: 13.35 Gy
Reference Point Session Dosage Given: 2.67 Gy
Session Number: 9

## 2023-02-20 ENCOUNTER — Other Ambulatory Visit: Payer: Self-pay

## 2023-02-20 ENCOUNTER — Ambulatory Visit
Admission: RE | Admit: 2023-02-20 | Discharge: 2023-02-20 | Disposition: A | Discharge status: Home / Self Care | Payer: Medicare Other | Source: Ambulatory Visit | Attending: Radiation Oncology | Admitting: Radiation Oncology

## 2023-02-20 DIAGNOSIS — D649 Anemia, unspecified: Secondary | ICD-10-CM | POA: Diagnosis not present

## 2023-02-20 DIAGNOSIS — E7800 Pure hypercholesterolemia, unspecified: Secondary | ICD-10-CM | POA: Diagnosis not present

## 2023-02-20 DIAGNOSIS — Z170 Estrogen receptor positive status [ER+]: Secondary | ICD-10-CM | POA: Diagnosis not present

## 2023-02-20 DIAGNOSIS — M549 Dorsalgia, unspecified: Secondary | ICD-10-CM | POA: Diagnosis not present

## 2023-02-20 DIAGNOSIS — I10 Essential (primary) hypertension: Secondary | ICD-10-CM | POA: Diagnosis not present

## 2023-02-20 DIAGNOSIS — J45909 Unspecified asthma, uncomplicated: Secondary | ICD-10-CM | POA: Diagnosis not present

## 2023-02-20 DIAGNOSIS — Z853 Personal history of malignant neoplasm of breast: Secondary | ICD-10-CM | POA: Diagnosis not present

## 2023-02-20 DIAGNOSIS — C50112 Malignant neoplasm of central portion of left female breast: Secondary | ICD-10-CM | POA: Diagnosis not present

## 2023-02-20 DIAGNOSIS — E559 Vitamin D deficiency, unspecified: Secondary | ICD-10-CM | POA: Diagnosis not present

## 2023-02-20 DIAGNOSIS — G894 Chronic pain syndrome: Secondary | ICD-10-CM | POA: Diagnosis not present

## 2023-02-20 DIAGNOSIS — M150 Primary generalized (osteo)arthritis: Secondary | ICD-10-CM | POA: Diagnosis not present

## 2023-02-20 DIAGNOSIS — I4510 Unspecified right bundle-branch block: Secondary | ICD-10-CM | POA: Diagnosis not present

## 2023-02-20 DIAGNOSIS — F5101 Primary insomnia: Secondary | ICD-10-CM | POA: Diagnosis not present

## 2023-02-20 DIAGNOSIS — I872 Venous insufficiency (chronic) (peripheral): Secondary | ICD-10-CM | POA: Diagnosis not present

## 2023-02-20 DIAGNOSIS — Z510 Encounter for antineoplastic radiation therapy: Secondary | ICD-10-CM | POA: Diagnosis not present

## 2023-02-20 DIAGNOSIS — Z7982 Long term (current) use of aspirin: Secondary | ICD-10-CM | POA: Diagnosis not present

## 2023-02-20 DIAGNOSIS — M109 Gout, unspecified: Secondary | ICD-10-CM | POA: Diagnosis not present

## 2023-02-20 DIAGNOSIS — Z79891 Long term (current) use of opiate analgesic: Secondary | ICD-10-CM | POA: Diagnosis not present

## 2023-02-20 DIAGNOSIS — Z87440 Personal history of urinary (tract) infections: Secondary | ICD-10-CM | POA: Diagnosis not present

## 2023-02-20 DIAGNOSIS — F32A Depression, unspecified: Secondary | ICD-10-CM | POA: Diagnosis not present

## 2023-02-20 DIAGNOSIS — I4891 Unspecified atrial fibrillation: Secondary | ICD-10-CM | POA: Diagnosis not present

## 2023-02-20 LAB — RAD ONC ARIA SESSION SUMMARY
Course Elapsed Days: 14
Plan Fractions Treated to Date: 5
Plan Prescribed Dose Per Fraction: 2.67 Gy
Plan Total Fractions Prescribed: 7
Plan Total Prescribed Dose: 18.69 Gy
Reference Point Dosage Given to Date: 13.35 Gy
Reference Point Session Dosage Given: 2.67 Gy
Session Number: 10

## 2023-02-21 ENCOUNTER — Ambulatory Visit
Admission: RE | Admit: 2023-02-21 | Discharge: 2023-02-21 | Disposition: A | Discharge status: Home / Self Care | Payer: Medicare Other | Source: Ambulatory Visit | Attending: Radiation Oncology | Admitting: Radiation Oncology

## 2023-02-21 ENCOUNTER — Other Ambulatory Visit: Payer: Self-pay

## 2023-02-21 DIAGNOSIS — G894 Chronic pain syndrome: Secondary | ICD-10-CM | POA: Diagnosis not present

## 2023-02-21 DIAGNOSIS — T402X5A Adverse effect of other opioids, initial encounter: Secondary | ICD-10-CM | POA: Diagnosis not present

## 2023-02-21 DIAGNOSIS — M109 Gout, unspecified: Secondary | ICD-10-CM | POA: Diagnosis not present

## 2023-02-21 DIAGNOSIS — J45909 Unspecified asthma, uncomplicated: Secondary | ICD-10-CM | POA: Diagnosis not present

## 2023-02-21 DIAGNOSIS — Z7982 Long term (current) use of aspirin: Secondary | ICD-10-CM | POA: Diagnosis not present

## 2023-02-21 DIAGNOSIS — Z87440 Personal history of urinary (tract) infections: Secondary | ICD-10-CM | POA: Diagnosis not present

## 2023-02-21 DIAGNOSIS — M150 Primary generalized (osteo)arthritis: Secondary | ICD-10-CM | POA: Diagnosis not present

## 2023-02-21 DIAGNOSIS — I4510 Unspecified right bundle-branch block: Secondary | ICD-10-CM | POA: Diagnosis not present

## 2023-02-21 DIAGNOSIS — D649 Anemia, unspecified: Secondary | ICD-10-CM | POA: Diagnosis not present

## 2023-02-21 DIAGNOSIS — E7800 Pure hypercholesterolemia, unspecified: Secondary | ICD-10-CM | POA: Diagnosis not present

## 2023-02-21 DIAGNOSIS — C50112 Malignant neoplasm of central portion of left female breast: Secondary | ICD-10-CM | POA: Diagnosis not present

## 2023-02-21 DIAGNOSIS — Z853 Personal history of malignant neoplasm of breast: Secondary | ICD-10-CM | POA: Diagnosis not present

## 2023-02-21 DIAGNOSIS — M549 Dorsalgia, unspecified: Secondary | ICD-10-CM | POA: Diagnosis not present

## 2023-02-21 DIAGNOSIS — E559 Vitamin D deficiency, unspecified: Secondary | ICD-10-CM | POA: Diagnosis not present

## 2023-02-21 DIAGNOSIS — Z5181 Encounter for therapeutic drug level monitoring: Secondary | ICD-10-CM | POA: Diagnosis not present

## 2023-02-21 DIAGNOSIS — I4891 Unspecified atrial fibrillation: Secondary | ICD-10-CM | POA: Diagnosis not present

## 2023-02-21 DIAGNOSIS — F32A Depression, unspecified: Secondary | ICD-10-CM | POA: Diagnosis not present

## 2023-02-21 DIAGNOSIS — Z79899 Other long term (current) drug therapy: Secondary | ICD-10-CM | POA: Diagnosis not present

## 2023-02-21 DIAGNOSIS — Z170 Estrogen receptor positive status [ER+]: Secondary | ICD-10-CM | POA: Diagnosis not present

## 2023-02-21 DIAGNOSIS — I872 Venous insufficiency (chronic) (peripheral): Secondary | ICD-10-CM | POA: Diagnosis not present

## 2023-02-21 DIAGNOSIS — Z510 Encounter for antineoplastic radiation therapy: Secondary | ICD-10-CM | POA: Diagnosis not present

## 2023-02-21 DIAGNOSIS — M5459 Other low back pain: Secondary | ICD-10-CM | POA: Diagnosis not present

## 2023-02-21 DIAGNOSIS — I10 Essential (primary) hypertension: Secondary | ICD-10-CM | POA: Diagnosis not present

## 2023-02-21 DIAGNOSIS — Z79891 Long term (current) use of opiate analgesic: Secondary | ICD-10-CM | POA: Diagnosis not present

## 2023-02-21 DIAGNOSIS — F5101 Primary insomnia: Secondary | ICD-10-CM | POA: Diagnosis not present

## 2023-02-21 LAB — RAD ONC ARIA SESSION SUMMARY
Course Elapsed Days: 15
Plan Fractions Treated to Date: 6
Plan Prescribed Dose Per Fraction: 2.67 Gy
Plan Total Fractions Prescribed: 8
Plan Total Prescribed Dose: 21.36 Gy
Reference Point Dosage Given to Date: 16.02 Gy
Reference Point Session Dosage Given: 2.67 Gy
Session Number: 11

## 2023-02-22 ENCOUNTER — Other Ambulatory Visit: Payer: Self-pay

## 2023-02-22 ENCOUNTER — Ambulatory Visit
Admission: RE | Admit: 2023-02-22 | Discharge: 2023-02-22 | Disposition: A | Discharge status: Home / Self Care | Payer: Medicare Other | Source: Ambulatory Visit | Attending: Radiation Oncology | Admitting: Radiation Oncology

## 2023-02-22 ENCOUNTER — Other Ambulatory Visit: Payer: Self-pay | Admitting: *Deleted

## 2023-02-22 ENCOUNTER — Ambulatory Visit: Admission: RE | Admit: 2023-02-22 | Payer: Medicare Other | Source: Ambulatory Visit | Admitting: Radiation Oncology

## 2023-02-22 DIAGNOSIS — Z853 Personal history of malignant neoplasm of breast: Secondary | ICD-10-CM | POA: Diagnosis not present

## 2023-02-22 DIAGNOSIS — Z7982 Long term (current) use of aspirin: Secondary | ICD-10-CM | POA: Diagnosis not present

## 2023-02-22 DIAGNOSIS — M150 Primary generalized (osteo)arthritis: Secondary | ICD-10-CM | POA: Diagnosis not present

## 2023-02-22 DIAGNOSIS — Z510 Encounter for antineoplastic radiation therapy: Secondary | ICD-10-CM | POA: Diagnosis not present

## 2023-02-22 DIAGNOSIS — Z170 Estrogen receptor positive status [ER+]: Secondary | ICD-10-CM | POA: Diagnosis not present

## 2023-02-22 DIAGNOSIS — I4510 Unspecified right bundle-branch block: Secondary | ICD-10-CM | POA: Diagnosis not present

## 2023-02-22 DIAGNOSIS — F5101 Primary insomnia: Secondary | ICD-10-CM | POA: Diagnosis not present

## 2023-02-22 DIAGNOSIS — Z79891 Long term (current) use of opiate analgesic: Secondary | ICD-10-CM | POA: Diagnosis not present

## 2023-02-22 DIAGNOSIS — M549 Dorsalgia, unspecified: Secondary | ICD-10-CM | POA: Diagnosis not present

## 2023-02-22 DIAGNOSIS — C50112 Malignant neoplasm of central portion of left female breast: Secondary | ICD-10-CM | POA: Diagnosis not present

## 2023-02-22 DIAGNOSIS — J45909 Unspecified asthma, uncomplicated: Secondary | ICD-10-CM | POA: Diagnosis not present

## 2023-02-22 DIAGNOSIS — G894 Chronic pain syndrome: Secondary | ICD-10-CM | POA: Diagnosis not present

## 2023-02-22 DIAGNOSIS — Z87440 Personal history of urinary (tract) infections: Secondary | ICD-10-CM | POA: Diagnosis not present

## 2023-02-22 DIAGNOSIS — I10 Essential (primary) hypertension: Secondary | ICD-10-CM | POA: Diagnosis not present

## 2023-02-22 DIAGNOSIS — E7800 Pure hypercholesterolemia, unspecified: Secondary | ICD-10-CM | POA: Diagnosis not present

## 2023-02-22 DIAGNOSIS — D649 Anemia, unspecified: Secondary | ICD-10-CM | POA: Diagnosis not present

## 2023-02-22 DIAGNOSIS — I872 Venous insufficiency (chronic) (peripheral): Secondary | ICD-10-CM | POA: Diagnosis not present

## 2023-02-22 DIAGNOSIS — I4891 Unspecified atrial fibrillation: Secondary | ICD-10-CM | POA: Diagnosis not present

## 2023-02-22 DIAGNOSIS — F32A Depression, unspecified: Secondary | ICD-10-CM | POA: Diagnosis not present

## 2023-02-22 DIAGNOSIS — M109 Gout, unspecified: Secondary | ICD-10-CM | POA: Diagnosis not present

## 2023-02-22 DIAGNOSIS — E559 Vitamin D deficiency, unspecified: Secondary | ICD-10-CM | POA: Diagnosis not present

## 2023-02-22 LAB — RAD ONC ARIA SESSION SUMMARY
Course Elapsed Days: 16
Plan Fractions Treated to Date: 6
Plan Prescribed Dose Per Fraction: 2.67 Gy
Plan Total Fractions Prescribed: 7
Plan Total Prescribed Dose: 18.69 Gy
Reference Point Dosage Given to Date: 16.02 Gy
Reference Point Session Dosage Given: 2.67 Gy
Session Number: 12

## 2023-02-22 MED ORDER — RADIAPLEXRX EX GEL: Freq: Once | CUTANEOUS | Status: AC

## 2023-02-22 MED ORDER — SILVER SULFADIAZINE 1 % EX CREA: TOPICAL_CREAM | Freq: Two times a day (BID) | CUTANEOUS | Status: DC

## 2023-02-22 MED ORDER — VENLAFAXINE HCL ER 37.5 MG PO CP24: ORAL_CAPSULE | ORAL | 0 refills | Status: DC

## 2023-02-22 NOTE — Telephone Encounter (Signed)
Pt and husband spoke to this RN post her radiation treatment wanting to inquire about resuming venlafaxine and how to get a refill.  She was started on the medication when she was under Dr Magrinat's care- completed care 2022 - and then did not obtain further refills post December 2023- note pt was released from Breast cancer care in December 2022- then had a recurrence per skin biopsy and is now under further treatment.  Per discussion - Anju states she did not think about getting further refills "because Dr Darnelle Catalan was gone and then didn't ask Dr Al Pimple when I started seeing her but now I am having issues ( sleep,anxiety general sense of well being ) again and feel I need to be on it again"  This RN informed her refill will be given but due to length of time off medication we will need to resume reloading dose (verified with our pharmacy).  Pharmacy verified and prescription for reloading dosage sent. This RN did ask pt and her husband to call this RN when the start the 2 tabs a day for update on symptoms and obtaining further refills with appropriate dosage.  Note pt had previously been on 75 mg ER bid.

## 2023-02-25 ENCOUNTER — Ambulatory Visit
Admission: RE | Admit: 2023-02-25 | Discharge: 2023-02-25 | Disposition: A | Discharge status: Home / Self Care | Payer: Medicare Other | Source: Ambulatory Visit | Attending: Radiation Oncology | Admitting: Radiation Oncology

## 2023-02-25 ENCOUNTER — Other Ambulatory Visit: Payer: Self-pay

## 2023-02-25 ENCOUNTER — Ambulatory Visit: Payer: Medicare Other | Admitting: Radiation Oncology

## 2023-02-25 DIAGNOSIS — F32A Depression, unspecified: Secondary | ICD-10-CM | POA: Diagnosis not present

## 2023-02-25 DIAGNOSIS — E559 Vitamin D deficiency, unspecified: Secondary | ICD-10-CM | POA: Diagnosis not present

## 2023-02-25 DIAGNOSIS — I4510 Unspecified right bundle-branch block: Secondary | ICD-10-CM | POA: Diagnosis not present

## 2023-02-25 DIAGNOSIS — I4891 Unspecified atrial fibrillation: Secondary | ICD-10-CM | POA: Diagnosis not present

## 2023-02-25 DIAGNOSIS — M150 Primary generalized (osteo)arthritis: Secondary | ICD-10-CM | POA: Diagnosis not present

## 2023-02-25 DIAGNOSIS — J45909 Unspecified asthma, uncomplicated: Secondary | ICD-10-CM | POA: Diagnosis not present

## 2023-02-25 DIAGNOSIS — I10 Essential (primary) hypertension: Secondary | ICD-10-CM | POA: Diagnosis not present

## 2023-02-25 DIAGNOSIS — D649 Anemia, unspecified: Secondary | ICD-10-CM | POA: Diagnosis not present

## 2023-02-25 DIAGNOSIS — I872 Venous insufficiency (chronic) (peripheral): Secondary | ICD-10-CM | POA: Diagnosis not present

## 2023-02-25 DIAGNOSIS — G894 Chronic pain syndrome: Secondary | ICD-10-CM | POA: Diagnosis not present

## 2023-02-25 DIAGNOSIS — Z79891 Long term (current) use of opiate analgesic: Secondary | ICD-10-CM | POA: Diagnosis not present

## 2023-02-25 DIAGNOSIS — Z7982 Long term (current) use of aspirin: Secondary | ICD-10-CM | POA: Diagnosis not present

## 2023-02-25 DIAGNOSIS — E7800 Pure hypercholesterolemia, unspecified: Secondary | ICD-10-CM | POA: Diagnosis not present

## 2023-02-25 DIAGNOSIS — F5101 Primary insomnia: Secondary | ICD-10-CM | POA: Diagnosis not present

## 2023-02-25 DIAGNOSIS — Z510 Encounter for antineoplastic radiation therapy: Secondary | ICD-10-CM | POA: Diagnosis not present

## 2023-02-25 DIAGNOSIS — M549 Dorsalgia, unspecified: Secondary | ICD-10-CM | POA: Diagnosis not present

## 2023-02-25 DIAGNOSIS — M109 Gout, unspecified: Secondary | ICD-10-CM | POA: Diagnosis not present

## 2023-02-25 DIAGNOSIS — Z170 Estrogen receptor positive status [ER+]: Secondary | ICD-10-CM | POA: Diagnosis not present

## 2023-02-25 DIAGNOSIS — Z87440 Personal history of urinary (tract) infections: Secondary | ICD-10-CM | POA: Diagnosis not present

## 2023-02-25 DIAGNOSIS — C50112 Malignant neoplasm of central portion of left female breast: Secondary | ICD-10-CM | POA: Diagnosis not present

## 2023-02-25 DIAGNOSIS — Z853 Personal history of malignant neoplasm of breast: Secondary | ICD-10-CM | POA: Diagnosis not present

## 2023-02-25 LAB — RAD ONC ARIA SESSION SUMMARY
Course Elapsed Days: 19
Plan Fractions Treated to Date: 7
Plan Prescribed Dose Per Fraction: 2.67 Gy
Plan Total Fractions Prescribed: 8
Plan Total Prescribed Dose: 21.36 Gy
Reference Point Dosage Given to Date: 18.69 Gy
Reference Point Session Dosage Given: 2.67 Gy
Session Number: 13

## 2023-02-26 ENCOUNTER — Ambulatory Visit
Admission: RE | Admit: 2023-02-26 | Discharge: 2023-02-26 | Disposition: A | Discharge status: Home / Self Care | Payer: Medicare Other | Source: Ambulatory Visit | Attending: Radiation Oncology | Admitting: Radiation Oncology

## 2023-02-26 ENCOUNTER — Other Ambulatory Visit: Payer: Self-pay

## 2023-02-26 ENCOUNTER — Ambulatory Visit: Admission: RE | Admit: 2023-02-26 | Payer: Medicare Other | Source: Ambulatory Visit | Admitting: Radiation Oncology

## 2023-02-26 ENCOUNTER — Ambulatory Visit: Payer: Medicare Other | Admitting: Radiation Oncology

## 2023-02-26 DIAGNOSIS — C50112 Malignant neoplasm of central portion of left female breast: Secondary | ICD-10-CM | POA: Diagnosis not present

## 2023-02-26 DIAGNOSIS — Z510 Encounter for antineoplastic radiation therapy: Secondary | ICD-10-CM | POA: Diagnosis not present

## 2023-02-26 DIAGNOSIS — Z170 Estrogen receptor positive status [ER+]: Secondary | ICD-10-CM | POA: Diagnosis not present

## 2023-02-26 LAB — RAD ONC ARIA SESSION SUMMARY
Course Elapsed Days: 20
Plan Fractions Treated to Date: 7
Plan Prescribed Dose Per Fraction: 2.67 Gy
Plan Total Fractions Prescribed: 7
Plan Total Prescribed Dose: 18.69 Gy
Reference Point Dosage Given to Date: 18.69 Gy
Reference Point Session Dosage Given: 2.67 Gy
Session Number: 14

## 2023-02-27 ENCOUNTER — Telehealth: Payer: Self-pay | Admitting: Radiation Oncology

## 2023-02-27 ENCOUNTER — Ambulatory Visit
Admission: RE | Admit: 2023-02-27 | Discharge: 2023-02-27 | Disposition: A | Discharge status: Home / Self Care | Payer: Medicare Other | Source: Ambulatory Visit | Attending: Radiation Oncology | Admitting: Radiation Oncology

## 2023-02-27 ENCOUNTER — Other Ambulatory Visit: Payer: Self-pay

## 2023-02-27 DIAGNOSIS — C50112 Malignant neoplasm of central portion of left female breast: Secondary | ICD-10-CM | POA: Diagnosis not present

## 2023-02-27 DIAGNOSIS — Z170 Estrogen receptor positive status [ER+]: Secondary | ICD-10-CM | POA: Diagnosis not present

## 2023-02-27 DIAGNOSIS — Z510 Encounter for antineoplastic radiation therapy: Secondary | ICD-10-CM | POA: Diagnosis not present

## 2023-02-27 LAB — RAD ONC ARIA SESSION SUMMARY
Course Elapsed Days: 21
Plan Fractions Treated to Date: 8
Plan Prescribed Dose Per Fraction: 2.67 Gy
Plan Total Fractions Prescribed: 8
Plan Total Prescribed Dose: 21.36 Gy
Reference Point Dosage Given to Date: 21.36 Gy
Reference Point Session Dosage Given: 2.67 Gy
Session Number: 15

## 2023-02-27 NOTE — Telephone Encounter (Signed)
6/12 @ 11:20 am patient's husband call to speak to someone about her treatment appt and seeing her doctor for 6/14.  Called L3 machine spoke to [place] and then transferred call.

## 2023-02-28 ENCOUNTER — Ambulatory Visit
Admission: RE | Admit: 2023-02-28 | Discharge: 2023-02-28 | Disposition: A | Discharge status: Home / Self Care | Payer: Medicare Other | Source: Ambulatory Visit | Attending: Radiation Oncology | Admitting: Radiation Oncology

## 2023-02-28 ENCOUNTER — Other Ambulatory Visit: Payer: Self-pay

## 2023-02-28 DIAGNOSIS — C50112 Malignant neoplasm of central portion of left female breast: Secondary | ICD-10-CM | POA: Diagnosis not present

## 2023-02-28 DIAGNOSIS — Z170 Estrogen receptor positive status [ER+]: Secondary | ICD-10-CM | POA: Diagnosis not present

## 2023-02-28 DIAGNOSIS — Z510 Encounter for antineoplastic radiation therapy: Secondary | ICD-10-CM | POA: Diagnosis not present

## 2023-02-28 LAB — RAD ONC ARIA SESSION SUMMARY
Course Elapsed Days: 22
Plan Fractions Treated to Date: 1
Plan Prescribed Dose Per Fraction: 2.5 Gy
Plan Total Fractions Prescribed: 5
Plan Total Prescribed Dose: 12.5 Gy
Reference Point Dosage Given to Date: 2.5 Gy
Reference Point Session Dosage Given: 2.5 Gy
Session Number: 16

## 2023-03-01 ENCOUNTER — Other Ambulatory Visit: Payer: Self-pay

## 2023-03-01 ENCOUNTER — Ambulatory Visit
Admission: RE | Admit: 2023-03-01 | Discharge: 2023-03-01 | Disposition: A | Discharge status: Home / Self Care | Payer: Medicare Other | Source: Ambulatory Visit | Attending: Radiation Oncology | Admitting: Radiation Oncology

## 2023-03-01 ENCOUNTER — Telehealth: Payer: Self-pay | Admitting: Radiation Oncology

## 2023-03-01 DIAGNOSIS — Z79891 Long term (current) use of opiate analgesic: Secondary | ICD-10-CM | POA: Diagnosis not present

## 2023-03-01 DIAGNOSIS — C50112 Malignant neoplasm of central portion of left female breast: Secondary | ICD-10-CM | POA: Diagnosis not present

## 2023-03-01 DIAGNOSIS — I4891 Unspecified atrial fibrillation: Secondary | ICD-10-CM | POA: Diagnosis not present

## 2023-03-01 DIAGNOSIS — I4510 Unspecified right bundle-branch block: Secondary | ICD-10-CM | POA: Diagnosis not present

## 2023-03-01 DIAGNOSIS — J45909 Unspecified asthma, uncomplicated: Secondary | ICD-10-CM | POA: Diagnosis not present

## 2023-03-01 DIAGNOSIS — M549 Dorsalgia, unspecified: Secondary | ICD-10-CM | POA: Diagnosis not present

## 2023-03-01 DIAGNOSIS — I10 Essential (primary) hypertension: Secondary | ICD-10-CM | POA: Diagnosis not present

## 2023-03-01 DIAGNOSIS — Z853 Personal history of malignant neoplasm of breast: Secondary | ICD-10-CM | POA: Diagnosis not present

## 2023-03-01 DIAGNOSIS — M150 Primary generalized (osteo)arthritis: Secondary | ICD-10-CM | POA: Diagnosis not present

## 2023-03-01 DIAGNOSIS — D649 Anemia, unspecified: Secondary | ICD-10-CM | POA: Diagnosis not present

## 2023-03-01 DIAGNOSIS — M109 Gout, unspecified: Secondary | ICD-10-CM | POA: Diagnosis not present

## 2023-03-01 DIAGNOSIS — Z510 Encounter for antineoplastic radiation therapy: Secondary | ICD-10-CM | POA: Diagnosis not present

## 2023-03-01 DIAGNOSIS — Z7982 Long term (current) use of aspirin: Secondary | ICD-10-CM | POA: Diagnosis not present

## 2023-03-01 DIAGNOSIS — F32A Depression, unspecified: Secondary | ICD-10-CM | POA: Diagnosis not present

## 2023-03-01 DIAGNOSIS — E559 Vitamin D deficiency, unspecified: Secondary | ICD-10-CM | POA: Diagnosis not present

## 2023-03-01 DIAGNOSIS — F5101 Primary insomnia: Secondary | ICD-10-CM | POA: Diagnosis not present

## 2023-03-01 DIAGNOSIS — G894 Chronic pain syndrome: Secondary | ICD-10-CM | POA: Diagnosis not present

## 2023-03-01 DIAGNOSIS — I872 Venous insufficiency (chronic) (peripheral): Secondary | ICD-10-CM | POA: Diagnosis not present

## 2023-03-01 DIAGNOSIS — Z170 Estrogen receptor positive status [ER+]: Secondary | ICD-10-CM | POA: Diagnosis not present

## 2023-03-01 DIAGNOSIS — E7800 Pure hypercholesterolemia, unspecified: Secondary | ICD-10-CM | POA: Diagnosis not present

## 2023-03-01 DIAGNOSIS — Z87440 Personal history of urinary (tract) infections: Secondary | ICD-10-CM | POA: Diagnosis not present

## 2023-03-01 LAB — RAD ONC ARIA SESSION SUMMARY
Course Elapsed Days: 23
Plan Fractions Treated to Date: 2
Plan Prescribed Dose Per Fraction: 2.5 Gy
Plan Total Fractions Prescribed: 5
Plan Total Prescribed Dose: 12.5 Gy
Reference Point Dosage Given to Date: 5 Gy
Reference Point Session Dosage Given: 2.5 Gy
Session Number: 17

## 2023-03-01 NOTE — Telephone Encounter (Signed)
Pt's husband called stating he was returning a call. No note found in pt's chart to indicate who called them; husband denies any voicemail. Double checked with nursing and therapist teams, neither called. Advised pt's husband of this who verified understanding.

## 2023-03-04 ENCOUNTER — Other Ambulatory Visit: Payer: Self-pay

## 2023-03-04 ENCOUNTER — Ambulatory Visit
Admission: RE | Admit: 2023-03-04 | Discharge: 2023-03-04 | Disposition: A | Discharge status: Home / Self Care | Payer: Medicare Other | Source: Ambulatory Visit | Attending: Radiation Oncology | Admitting: Radiation Oncology

## 2023-03-04 DIAGNOSIS — Z510 Encounter for antineoplastic radiation therapy: Secondary | ICD-10-CM | POA: Diagnosis not present

## 2023-03-04 DIAGNOSIS — C50112 Malignant neoplasm of central portion of left female breast: Secondary | ICD-10-CM

## 2023-03-04 DIAGNOSIS — Z170 Estrogen receptor positive status [ER+]: Secondary | ICD-10-CM

## 2023-03-04 LAB — RAD ONC ARIA SESSION SUMMARY
Course Elapsed Days: 26
Plan Fractions Treated to Date: 3
Plan Prescribed Dose Per Fraction: 2.5 Gy
Plan Total Fractions Prescribed: 5
Plan Total Prescribed Dose: 12.5 Gy
Reference Point Dosage Given to Date: 7.5 Gy
Reference Point Session Dosage Given: 2.5 Gy
Session Number: 18

## 2023-03-04 MED ORDER — RADIAPLEXRX EX GEL: Freq: Once | CUTANEOUS | Status: AC

## 2023-03-04 MED ORDER — SILVER SULFADIAZINE 1 % EX CREA: TOPICAL_CREAM | Freq: Two times a day (BID) | CUTANEOUS | Status: DC

## 2023-03-04 NOTE — Progress Notes (Signed)
Survivorship referral ordered per Dr. Basilio Cairo.

## 2023-03-05 ENCOUNTER — Other Ambulatory Visit: Payer: Self-pay

## 2023-03-05 ENCOUNTER — Ambulatory Visit
Admission: RE | Admit: 2023-03-05 | Discharge: 2023-03-05 | Disposition: A | Discharge status: Home / Self Care | Payer: Medicare Other | Source: Ambulatory Visit | Attending: Radiation Oncology | Admitting: Radiation Oncology

## 2023-03-05 DIAGNOSIS — H35722 Serous detachment of retinal pigment epithelium, left eye: Secondary | ICD-10-CM | POA: Diagnosis not present

## 2023-03-05 DIAGNOSIS — Z9889 Other specified postprocedural states: Secondary | ICD-10-CM | POA: Diagnosis not present

## 2023-03-05 DIAGNOSIS — Z170 Estrogen receptor positive status [ER+]: Secondary | ICD-10-CM | POA: Diagnosis not present

## 2023-03-05 DIAGNOSIS — H3554 Dystrophies primarily involving the retinal pigment epithelium: Secondary | ICD-10-CM | POA: Diagnosis not present

## 2023-03-05 DIAGNOSIS — H353212 Exudative age-related macular degeneration, right eye, with inactive choroidal neovascularization: Secondary | ICD-10-CM | POA: Diagnosis not present

## 2023-03-05 DIAGNOSIS — H353134 Nonexudative age-related macular degeneration, bilateral, advanced atrophic with subfoveal involvement: Secondary | ICD-10-CM | POA: Diagnosis not present

## 2023-03-05 DIAGNOSIS — C50112 Malignant neoplasm of central portion of left female breast: Secondary | ICD-10-CM | POA: Diagnosis not present

## 2023-03-05 DIAGNOSIS — Z510 Encounter for antineoplastic radiation therapy: Secondary | ICD-10-CM | POA: Diagnosis not present

## 2023-03-05 LAB — RAD ONC ARIA SESSION SUMMARY
Course Elapsed Days: 27
Plan Fractions Treated to Date: 4
Plan Prescribed Dose Per Fraction: 2.5 Gy
Plan Total Fractions Prescribed: 5
Plan Total Prescribed Dose: 12.5 Gy
Reference Point Dosage Given to Date: 10 Gy
Reference Point Session Dosage Given: 2.5 Gy
Session Number: 19

## 2023-03-06 ENCOUNTER — Other Ambulatory Visit: Payer: Self-pay

## 2023-03-06 ENCOUNTER — Ambulatory Visit
Admission: RE | Admit: 2023-03-06 | Discharge: 2023-03-06 | Disposition: A | Discharge status: Home / Self Care | Payer: Medicare Other | Source: Ambulatory Visit | Attending: Radiation Oncology | Admitting: Radiation Oncology

## 2023-03-06 DIAGNOSIS — Z170 Estrogen receptor positive status [ER+]: Secondary | ICD-10-CM | POA: Diagnosis not present

## 2023-03-06 DIAGNOSIS — C50112 Malignant neoplasm of central portion of left female breast: Secondary | ICD-10-CM | POA: Diagnosis not present

## 2023-03-06 DIAGNOSIS — Z510 Encounter for antineoplastic radiation therapy: Secondary | ICD-10-CM | POA: Diagnosis not present

## 2023-03-06 LAB — RAD ONC ARIA SESSION SUMMARY
Course Elapsed Days: 28
Plan Fractions Treated to Date: 5
Plan Prescribed Dose Per Fraction: 2.5 Gy
Plan Total Fractions Prescribed: 5
Plan Total Prescribed Dose: 12.5 Gy
Reference Point Dosage Given to Date: 12.5 Gy
Reference Point Session Dosage Given: 2.5 Gy
Session Number: 20

## 2023-03-07 DIAGNOSIS — M549 Dorsalgia, unspecified: Secondary | ICD-10-CM | POA: Diagnosis not present

## 2023-03-07 DIAGNOSIS — I4510 Unspecified right bundle-branch block: Secondary | ICD-10-CM | POA: Diagnosis not present

## 2023-03-07 DIAGNOSIS — Z853 Personal history of malignant neoplasm of breast: Secondary | ICD-10-CM | POA: Diagnosis not present

## 2023-03-07 DIAGNOSIS — I4891 Unspecified atrial fibrillation: Secondary | ICD-10-CM | POA: Diagnosis not present

## 2023-03-07 DIAGNOSIS — F32A Depression, unspecified: Secondary | ICD-10-CM | POA: Diagnosis not present

## 2023-03-07 DIAGNOSIS — F5101 Primary insomnia: Secondary | ICD-10-CM | POA: Diagnosis not present

## 2023-03-07 DIAGNOSIS — Z79891 Long term (current) use of opiate analgesic: Secondary | ICD-10-CM | POA: Diagnosis not present

## 2023-03-07 DIAGNOSIS — I10 Essential (primary) hypertension: Secondary | ICD-10-CM | POA: Diagnosis not present

## 2023-03-07 DIAGNOSIS — E7800 Pure hypercholesterolemia, unspecified: Secondary | ICD-10-CM | POA: Diagnosis not present

## 2023-03-07 DIAGNOSIS — M109 Gout, unspecified: Secondary | ICD-10-CM | POA: Diagnosis not present

## 2023-03-07 DIAGNOSIS — M150 Primary generalized (osteo)arthritis: Secondary | ICD-10-CM | POA: Diagnosis not present

## 2023-03-07 DIAGNOSIS — Z87440 Personal history of urinary (tract) infections: Secondary | ICD-10-CM | POA: Diagnosis not present

## 2023-03-07 DIAGNOSIS — D649 Anemia, unspecified: Secondary | ICD-10-CM | POA: Diagnosis not present

## 2023-03-07 DIAGNOSIS — I872 Venous insufficiency (chronic) (peripheral): Secondary | ICD-10-CM | POA: Diagnosis not present

## 2023-03-07 DIAGNOSIS — J45909 Unspecified asthma, uncomplicated: Secondary | ICD-10-CM | POA: Diagnosis not present

## 2023-03-07 DIAGNOSIS — E559 Vitamin D deficiency, unspecified: Secondary | ICD-10-CM | POA: Diagnosis not present

## 2023-03-07 DIAGNOSIS — Z7982 Long term (current) use of aspirin: Secondary | ICD-10-CM | POA: Diagnosis not present

## 2023-03-07 DIAGNOSIS — G894 Chronic pain syndrome: Secondary | ICD-10-CM | POA: Diagnosis not present

## 2023-03-08 DIAGNOSIS — M150 Primary generalized (osteo)arthritis: Secondary | ICD-10-CM | POA: Diagnosis not present

## 2023-03-08 DIAGNOSIS — I872 Venous insufficiency (chronic) (peripheral): Secondary | ICD-10-CM | POA: Diagnosis not present

## 2023-03-08 DIAGNOSIS — Z7982 Long term (current) use of aspirin: Secondary | ICD-10-CM | POA: Diagnosis not present

## 2023-03-08 DIAGNOSIS — D649 Anemia, unspecified: Secondary | ICD-10-CM | POA: Diagnosis not present

## 2023-03-08 DIAGNOSIS — F5101 Primary insomnia: Secondary | ICD-10-CM | POA: Diagnosis not present

## 2023-03-08 DIAGNOSIS — F32A Depression, unspecified: Secondary | ICD-10-CM | POA: Diagnosis not present

## 2023-03-08 DIAGNOSIS — Z79891 Long term (current) use of opiate analgesic: Secondary | ICD-10-CM | POA: Diagnosis not present

## 2023-03-08 DIAGNOSIS — M549 Dorsalgia, unspecified: Secondary | ICD-10-CM | POA: Diagnosis not present

## 2023-03-08 DIAGNOSIS — J45909 Unspecified asthma, uncomplicated: Secondary | ICD-10-CM | POA: Diagnosis not present

## 2023-03-08 DIAGNOSIS — E7800 Pure hypercholesterolemia, unspecified: Secondary | ICD-10-CM | POA: Diagnosis not present

## 2023-03-08 DIAGNOSIS — E559 Vitamin D deficiency, unspecified: Secondary | ICD-10-CM | POA: Diagnosis not present

## 2023-03-08 DIAGNOSIS — Z87440 Personal history of urinary (tract) infections: Secondary | ICD-10-CM | POA: Diagnosis not present

## 2023-03-08 DIAGNOSIS — M109 Gout, unspecified: Secondary | ICD-10-CM | POA: Diagnosis not present

## 2023-03-08 DIAGNOSIS — G894 Chronic pain syndrome: Secondary | ICD-10-CM | POA: Diagnosis not present

## 2023-03-08 DIAGNOSIS — I4891 Unspecified atrial fibrillation: Secondary | ICD-10-CM | POA: Diagnosis not present

## 2023-03-08 DIAGNOSIS — I10 Essential (primary) hypertension: Secondary | ICD-10-CM | POA: Diagnosis not present

## 2023-03-08 DIAGNOSIS — Z853 Personal history of malignant neoplasm of breast: Secondary | ICD-10-CM | POA: Diagnosis not present

## 2023-03-08 DIAGNOSIS — I4510 Unspecified right bundle-branch block: Secondary | ICD-10-CM | POA: Diagnosis not present

## 2023-03-08 NOTE — Radiation Completion Notes (Signed)
Patient Name: Debra Griffith, Debra Griffith MRN: [id number] Date of Birth: April 06, 1941 Referring Physician: [name], M.D. Date of Service: 2023-03-08 Radiation Oncologist: Lonie Peak, M.D. [facility] [place]                             RADIATION ONCOLOGY END OF TREATMENT NOTE     Diagnosis: C50.112 Malignant neoplasm of central portion of left female breast Staging on 2023-01-18: Malignant neoplasm of upper-inner quadrant of left breast in female, estrogen receptor positive (HCC) T=T4b, N=NX, M=cM0 Intent: Curative     ==========DELIVERED PLANS==========  First Treatment Date: 2023-02-06 - Last Treatment Date: 2023-03-06   Plan Name: CW_L_axil_BO Site: Chest Wall, Left Technique: 3D Mode: Photon Dose Per Fraction: 2.67 Gy Prescribed Dose (Delivered / Prescribed): 21.36 Gy / 21.36 Gy Prescribed Fxs (Delivered / Prescribed): 8 / 8   Plan Name: CW_L_Bst_BO:1 Site: Chest Wall, Left Technique: Electron Mode: Electron Dose Per Fraction: 2.5 Gy Prescribed Dose (Delivered / Prescribed): 12.5 Gy / 12.5 Gy Prescribed Fxs (Delivered / Prescribed): 5 / 5   Plan Name: CW_L_axil Site: Chest Wall, Left Technique: 3D Mode: Photon Dose Per Fraction: 2.67 Gy Prescribed Dose (Delivered / Prescribed): 18.69 Gy / 18.69 Gy Prescribed Fxs (Delivered / Prescribed): 7 / 7     ==========ON TREATMENT VISIT DATES========== 2023-02-12, 2023-02-18, 2023-02-22, 2023-03-04     ==========UPCOMING VISITS==========       ==========APPENDIX - ON TREATMENT VISIT NOTES==========   See weekly On Treatment Notes in Epic for details.

## 2023-03-12 DIAGNOSIS — I4891 Unspecified atrial fibrillation: Secondary | ICD-10-CM | POA: Diagnosis not present

## 2023-03-12 DIAGNOSIS — M109 Gout, unspecified: Secondary | ICD-10-CM | POA: Diagnosis not present

## 2023-03-12 DIAGNOSIS — F32A Depression, unspecified: Secondary | ICD-10-CM | POA: Diagnosis not present

## 2023-03-12 DIAGNOSIS — E559 Vitamin D deficiency, unspecified: Secondary | ICD-10-CM | POA: Diagnosis not present

## 2023-03-12 DIAGNOSIS — E7800 Pure hypercholesterolemia, unspecified: Secondary | ICD-10-CM | POA: Diagnosis not present

## 2023-03-12 DIAGNOSIS — J45909 Unspecified asthma, uncomplicated: Secondary | ICD-10-CM | POA: Diagnosis not present

## 2023-03-12 DIAGNOSIS — I10 Essential (primary) hypertension: Secondary | ICD-10-CM | POA: Diagnosis not present

## 2023-03-12 DIAGNOSIS — Z79891 Long term (current) use of opiate analgesic: Secondary | ICD-10-CM | POA: Diagnosis not present

## 2023-03-12 DIAGNOSIS — L589 Radiodermatitis, unspecified: Secondary | ICD-10-CM | POA: Diagnosis not present

## 2023-03-12 DIAGNOSIS — I4510 Unspecified right bundle-branch block: Secondary | ICD-10-CM | POA: Diagnosis not present

## 2023-03-12 DIAGNOSIS — I872 Venous insufficiency (chronic) (peripheral): Secondary | ICD-10-CM | POA: Diagnosis not present

## 2023-03-12 DIAGNOSIS — Z87440 Personal history of urinary (tract) infections: Secondary | ICD-10-CM | POA: Diagnosis not present

## 2023-03-12 DIAGNOSIS — F5101 Primary insomnia: Secondary | ICD-10-CM | POA: Diagnosis not present

## 2023-03-12 DIAGNOSIS — G894 Chronic pain syndrome: Secondary | ICD-10-CM | POA: Diagnosis not present

## 2023-03-12 DIAGNOSIS — M549 Dorsalgia, unspecified: Secondary | ICD-10-CM | POA: Diagnosis not present

## 2023-03-12 DIAGNOSIS — D649 Anemia, unspecified: Secondary | ICD-10-CM | POA: Diagnosis not present

## 2023-03-12 DIAGNOSIS — Z7982 Long term (current) use of aspirin: Secondary | ICD-10-CM | POA: Diagnosis not present

## 2023-03-12 DIAGNOSIS — Z853 Personal history of malignant neoplasm of breast: Secondary | ICD-10-CM | POA: Diagnosis not present

## 2023-03-12 DIAGNOSIS — M150 Primary generalized (osteo)arthritis: Secondary | ICD-10-CM | POA: Diagnosis not present

## 2023-03-12 DIAGNOSIS — Z792 Long term (current) use of antibiotics: Secondary | ICD-10-CM | POA: Diagnosis not present

## 2023-03-15 ENCOUNTER — Telehealth: Payer: Self-pay

## 2023-03-15 ENCOUNTER — Inpatient Hospital Stay: Payer: Medicare Other | Attending: Hematology and Oncology

## 2023-03-15 ENCOUNTER — Other Ambulatory Visit: Payer: Self-pay

## 2023-03-15 DIAGNOSIS — Z170 Estrogen receptor positive status [ER+]: Secondary | ICD-10-CM | POA: Insufficient documentation

## 2023-03-15 DIAGNOSIS — C50212 Malignant neoplasm of upper-inner quadrant of left female breast: Secondary | ICD-10-CM | POA: Diagnosis not present

## 2023-03-15 DIAGNOSIS — Z79818 Long term (current) use of other agents affecting estrogen receptors and estrogen levels: Secondary | ICD-10-CM | POA: Insufficient documentation

## 2023-03-15 MED ORDER — FULVESTRANT 250 MG/5ML IM SOSY
500.0000 mg | PREFILLED_SYRINGE | Freq: Once | INTRAMUSCULAR | Status: AC
  Administered 2023-03-15: 500 mg via INTRAMUSCULAR
  Filled 2023-03-15: qty 10

## 2023-03-15 NOTE — Telephone Encounter (Signed)
Pt called to report to nurse that her skin is still seeping and reports continued pain. She would like someone to look at her chest to evaluate the concern. She reports she will be a the cancer center at 1315 and would like someone to look at it then. Rn dicussed with Dr. Basilio Cairo and she would like Rn to assess this need. Rn will call pt to let her now the plan.

## 2023-03-15 NOTE — Patient Instructions (Signed)

## 2023-03-15 NOTE — Progress Notes (Signed)
Pt came to clinic to see RN due to concerns for wounds under breast. Rn assessed the wound area. RN advised family and pt to continue silvadene cream to red/pink areas, apply neosporin to open areas under breast. Pt also advised to use radiaplex to upper breast area. Pt and family provided abd pads and telfa pads. Rn will call pt on Monday to check to see how wound is healing. Pt and family felt better after talking with RN about wounds.

## 2023-03-16 ENCOUNTER — Other Ambulatory Visit: Payer: Self-pay | Admitting: Hematology and Oncology

## 2023-03-16 DIAGNOSIS — I4510 Unspecified right bundle-branch block: Secondary | ICD-10-CM | POA: Diagnosis not present

## 2023-03-16 DIAGNOSIS — L589 Radiodermatitis, unspecified: Secondary | ICD-10-CM | POA: Diagnosis not present

## 2023-03-16 DIAGNOSIS — Z7982 Long term (current) use of aspirin: Secondary | ICD-10-CM | POA: Diagnosis not present

## 2023-03-16 DIAGNOSIS — D649 Anemia, unspecified: Secondary | ICD-10-CM | POA: Diagnosis not present

## 2023-03-16 DIAGNOSIS — Z79891 Long term (current) use of opiate analgesic: Secondary | ICD-10-CM | POA: Diagnosis not present

## 2023-03-16 DIAGNOSIS — E559 Vitamin D deficiency, unspecified: Secondary | ICD-10-CM | POA: Diagnosis not present

## 2023-03-16 DIAGNOSIS — M109 Gout, unspecified: Secondary | ICD-10-CM | POA: Diagnosis not present

## 2023-03-16 DIAGNOSIS — Z792 Long term (current) use of antibiotics: Secondary | ICD-10-CM | POA: Diagnosis not present

## 2023-03-16 DIAGNOSIS — I4891 Unspecified atrial fibrillation: Secondary | ICD-10-CM | POA: Diagnosis not present

## 2023-03-16 DIAGNOSIS — J45909 Unspecified asthma, uncomplicated: Secondary | ICD-10-CM | POA: Diagnosis not present

## 2023-03-16 DIAGNOSIS — Z853 Personal history of malignant neoplasm of breast: Secondary | ICD-10-CM | POA: Diagnosis not present

## 2023-03-16 DIAGNOSIS — I10 Essential (primary) hypertension: Secondary | ICD-10-CM | POA: Diagnosis not present

## 2023-03-16 DIAGNOSIS — F32A Depression, unspecified: Secondary | ICD-10-CM | POA: Diagnosis not present

## 2023-03-16 DIAGNOSIS — I872 Venous insufficiency (chronic) (peripheral): Secondary | ICD-10-CM | POA: Diagnosis not present

## 2023-03-16 DIAGNOSIS — M150 Primary generalized (osteo)arthritis: Secondary | ICD-10-CM | POA: Diagnosis not present

## 2023-03-16 DIAGNOSIS — Z87440 Personal history of urinary (tract) infections: Secondary | ICD-10-CM | POA: Diagnosis not present

## 2023-03-16 DIAGNOSIS — E7800 Pure hypercholesterolemia, unspecified: Secondary | ICD-10-CM | POA: Diagnosis not present

## 2023-03-16 DIAGNOSIS — F5101 Primary insomnia: Secondary | ICD-10-CM | POA: Diagnosis not present

## 2023-03-16 DIAGNOSIS — M549 Dorsalgia, unspecified: Secondary | ICD-10-CM | POA: Diagnosis not present

## 2023-03-16 DIAGNOSIS — G894 Chronic pain syndrome: Secondary | ICD-10-CM | POA: Diagnosis not present

## 2023-03-17 ENCOUNTER — Encounter: Payer: Self-pay | Admitting: Hematology and Oncology

## 2023-03-18 ENCOUNTER — Telehealth: Payer: Self-pay

## 2023-03-18 DIAGNOSIS — M109 Gout, unspecified: Secondary | ICD-10-CM | POA: Diagnosis not present

## 2023-03-18 DIAGNOSIS — I872 Venous insufficiency (chronic) (peripheral): Secondary | ICD-10-CM | POA: Diagnosis not present

## 2023-03-18 DIAGNOSIS — E559 Vitamin D deficiency, unspecified: Secondary | ICD-10-CM | POA: Diagnosis not present

## 2023-03-18 DIAGNOSIS — G894 Chronic pain syndrome: Secondary | ICD-10-CM | POA: Diagnosis not present

## 2023-03-18 DIAGNOSIS — Z87440 Personal history of urinary (tract) infections: Secondary | ICD-10-CM | POA: Diagnosis not present

## 2023-03-18 DIAGNOSIS — E7800 Pure hypercholesterolemia, unspecified: Secondary | ICD-10-CM | POA: Diagnosis not present

## 2023-03-18 DIAGNOSIS — I10 Essential (primary) hypertension: Secondary | ICD-10-CM | POA: Diagnosis not present

## 2023-03-18 DIAGNOSIS — F5101 Primary insomnia: Secondary | ICD-10-CM | POA: Diagnosis not present

## 2023-03-18 DIAGNOSIS — F32A Depression, unspecified: Secondary | ICD-10-CM | POA: Diagnosis not present

## 2023-03-18 DIAGNOSIS — Z7982 Long term (current) use of aspirin: Secondary | ICD-10-CM | POA: Diagnosis not present

## 2023-03-18 DIAGNOSIS — M150 Primary generalized (osteo)arthritis: Secondary | ICD-10-CM | POA: Diagnosis not present

## 2023-03-18 DIAGNOSIS — Z792 Long term (current) use of antibiotics: Secondary | ICD-10-CM | POA: Diagnosis not present

## 2023-03-18 DIAGNOSIS — D649 Anemia, unspecified: Secondary | ICD-10-CM | POA: Diagnosis not present

## 2023-03-18 DIAGNOSIS — I4510 Unspecified right bundle-branch block: Secondary | ICD-10-CM | POA: Diagnosis not present

## 2023-03-18 DIAGNOSIS — L589 Radiodermatitis, unspecified: Secondary | ICD-10-CM | POA: Diagnosis not present

## 2023-03-18 DIAGNOSIS — Z79891 Long term (current) use of opiate analgesic: Secondary | ICD-10-CM | POA: Diagnosis not present

## 2023-03-18 DIAGNOSIS — Z853 Personal history of malignant neoplasm of breast: Secondary | ICD-10-CM | POA: Diagnosis not present

## 2023-03-18 DIAGNOSIS — M549 Dorsalgia, unspecified: Secondary | ICD-10-CM | POA: Diagnosis not present

## 2023-03-18 DIAGNOSIS — I4891 Unspecified atrial fibrillation: Secondary | ICD-10-CM | POA: Diagnosis not present

## 2023-03-18 DIAGNOSIS — J45909 Unspecified asthma, uncomplicated: Secondary | ICD-10-CM | POA: Diagnosis not present

## 2023-03-18 NOTE — Telephone Encounter (Signed)
Pt husband reports that her breast area of concern is doing well. He reports the skin is coming back together well and he is very pleased with the wound healing. Pt husband reports he will call back after he returns from vacation on 04-01-23. He knows to call with any questions or concerns.

## 2023-03-29 DIAGNOSIS — Z7982 Long term (current) use of aspirin: Secondary | ICD-10-CM | POA: Diagnosis not present

## 2023-03-29 DIAGNOSIS — Z853 Personal history of malignant neoplasm of breast: Secondary | ICD-10-CM | POA: Diagnosis not present

## 2023-03-29 DIAGNOSIS — E7800 Pure hypercholesterolemia, unspecified: Secondary | ICD-10-CM | POA: Diagnosis not present

## 2023-03-29 DIAGNOSIS — F32A Depression, unspecified: Secondary | ICD-10-CM | POA: Diagnosis not present

## 2023-03-29 DIAGNOSIS — J45909 Unspecified asthma, uncomplicated: Secondary | ICD-10-CM | POA: Diagnosis not present

## 2023-03-29 DIAGNOSIS — E559 Vitamin D deficiency, unspecified: Secondary | ICD-10-CM | POA: Diagnosis not present

## 2023-03-29 DIAGNOSIS — Z79891 Long term (current) use of opiate analgesic: Secondary | ICD-10-CM | POA: Diagnosis not present

## 2023-03-29 DIAGNOSIS — G894 Chronic pain syndrome: Secondary | ICD-10-CM | POA: Diagnosis not present

## 2023-03-29 DIAGNOSIS — D649 Anemia, unspecified: Secondary | ICD-10-CM | POA: Diagnosis not present

## 2023-03-29 DIAGNOSIS — I4891 Unspecified atrial fibrillation: Secondary | ICD-10-CM | POA: Diagnosis not present

## 2023-03-29 DIAGNOSIS — M549 Dorsalgia, unspecified: Secondary | ICD-10-CM | POA: Diagnosis not present

## 2023-03-29 DIAGNOSIS — Z792 Long term (current) use of antibiotics: Secondary | ICD-10-CM | POA: Diagnosis not present

## 2023-03-29 DIAGNOSIS — Z87440 Personal history of urinary (tract) infections: Secondary | ICD-10-CM | POA: Diagnosis not present

## 2023-03-29 DIAGNOSIS — M109 Gout, unspecified: Secondary | ICD-10-CM | POA: Diagnosis not present

## 2023-03-29 DIAGNOSIS — M150 Primary generalized (osteo)arthritis: Secondary | ICD-10-CM | POA: Diagnosis not present

## 2023-03-29 DIAGNOSIS — I10 Essential (primary) hypertension: Secondary | ICD-10-CM | POA: Diagnosis not present

## 2023-03-29 DIAGNOSIS — I872 Venous insufficiency (chronic) (peripheral): Secondary | ICD-10-CM | POA: Diagnosis not present

## 2023-03-29 DIAGNOSIS — F5101 Primary insomnia: Secondary | ICD-10-CM | POA: Diagnosis not present

## 2023-03-29 DIAGNOSIS — L589 Radiodermatitis, unspecified: Secondary | ICD-10-CM | POA: Diagnosis not present

## 2023-03-29 DIAGNOSIS — I4510 Unspecified right bundle-branch block: Secondary | ICD-10-CM | POA: Diagnosis not present

## 2023-03-29 NOTE — Progress Notes (Signed)
Debra Griffith presents today for follow-up after completing radiation to her left breast on 03-06-23.   Pain: continues to have pain at wound site under her breast Skin: still continuing to use silvadene and neosporin at wound site under her breast, she reports it is so small now they can use a band aid, Dr. Dwain Sarna is still monitoring this for pt, RN encouraged use of Vitamin E cream/ lotion as well for two months Fatigue: remains though improving ROM: Still reports some stiffness but this is getting better with exercises Lymphedema: none to report MedOnc F/U: Dr. Al Pimple on 04-12-23 Other issues of note: No major concerns or questions at this time. The pt and her family were very grateful for the care they received from Dr. Basilio Cairo and our team.   Pt reports Yes No Comments  Tamoxifen []  [x]    Letrozole []  [x]    Anastrazole []  [x]    Mammogram [x]  Date:  []  Encouraged yearly mammograms.

## 2023-04-03 DIAGNOSIS — L589 Radiodermatitis, unspecified: Secondary | ICD-10-CM | POA: Diagnosis not present

## 2023-04-03 DIAGNOSIS — I872 Venous insufficiency (chronic) (peripheral): Secondary | ICD-10-CM | POA: Diagnosis not present

## 2023-04-03 DIAGNOSIS — F5101 Primary insomnia: Secondary | ICD-10-CM | POA: Diagnosis not present

## 2023-04-03 DIAGNOSIS — Z79891 Long term (current) use of opiate analgesic: Secondary | ICD-10-CM | POA: Diagnosis not present

## 2023-04-03 DIAGNOSIS — E7800 Pure hypercholesterolemia, unspecified: Secondary | ICD-10-CM | POA: Diagnosis not present

## 2023-04-03 DIAGNOSIS — M549 Dorsalgia, unspecified: Secondary | ICD-10-CM | POA: Diagnosis not present

## 2023-04-03 DIAGNOSIS — D649 Anemia, unspecified: Secondary | ICD-10-CM | POA: Diagnosis not present

## 2023-04-03 DIAGNOSIS — J45909 Unspecified asthma, uncomplicated: Secondary | ICD-10-CM | POA: Diagnosis not present

## 2023-04-03 DIAGNOSIS — F32A Depression, unspecified: Secondary | ICD-10-CM | POA: Diagnosis not present

## 2023-04-03 DIAGNOSIS — I10 Essential (primary) hypertension: Secondary | ICD-10-CM | POA: Diagnosis not present

## 2023-04-03 DIAGNOSIS — I4510 Unspecified right bundle-branch block: Secondary | ICD-10-CM | POA: Diagnosis not present

## 2023-04-03 DIAGNOSIS — E559 Vitamin D deficiency, unspecified: Secondary | ICD-10-CM | POA: Diagnosis not present

## 2023-04-03 DIAGNOSIS — G894 Chronic pain syndrome: Secondary | ICD-10-CM | POA: Diagnosis not present

## 2023-04-03 DIAGNOSIS — Z7982 Long term (current) use of aspirin: Secondary | ICD-10-CM | POA: Diagnosis not present

## 2023-04-03 DIAGNOSIS — Z853 Personal history of malignant neoplasm of breast: Secondary | ICD-10-CM | POA: Diagnosis not present

## 2023-04-03 DIAGNOSIS — Z792 Long term (current) use of antibiotics: Secondary | ICD-10-CM | POA: Diagnosis not present

## 2023-04-03 DIAGNOSIS — M150 Primary generalized (osteo)arthritis: Secondary | ICD-10-CM | POA: Diagnosis not present

## 2023-04-03 DIAGNOSIS — M109 Gout, unspecified: Secondary | ICD-10-CM | POA: Diagnosis not present

## 2023-04-03 DIAGNOSIS — Z87440 Personal history of urinary (tract) infections: Secondary | ICD-10-CM | POA: Diagnosis not present

## 2023-04-03 DIAGNOSIS — I4891 Unspecified atrial fibrillation: Secondary | ICD-10-CM | POA: Diagnosis not present

## 2023-04-05 DIAGNOSIS — Z7982 Long term (current) use of aspirin: Secondary | ICD-10-CM | POA: Diagnosis not present

## 2023-04-05 DIAGNOSIS — E559 Vitamin D deficiency, unspecified: Secondary | ICD-10-CM | POA: Diagnosis not present

## 2023-04-05 DIAGNOSIS — I4891 Unspecified atrial fibrillation: Secondary | ICD-10-CM | POA: Diagnosis not present

## 2023-04-05 DIAGNOSIS — M549 Dorsalgia, unspecified: Secondary | ICD-10-CM | POA: Diagnosis not present

## 2023-04-05 DIAGNOSIS — M109 Gout, unspecified: Secondary | ICD-10-CM | POA: Diagnosis not present

## 2023-04-05 DIAGNOSIS — C792 Secondary malignant neoplasm of skin: Secondary | ICD-10-CM | POA: Diagnosis not present

## 2023-04-05 DIAGNOSIS — D649 Anemia, unspecified: Secondary | ICD-10-CM | POA: Diagnosis not present

## 2023-04-05 DIAGNOSIS — M150 Primary generalized (osteo)arthritis: Secondary | ICD-10-CM | POA: Diagnosis not present

## 2023-04-05 DIAGNOSIS — G894 Chronic pain syndrome: Secondary | ICD-10-CM | POA: Diagnosis not present

## 2023-04-05 DIAGNOSIS — J45909 Unspecified asthma, uncomplicated: Secondary | ICD-10-CM | POA: Diagnosis not present

## 2023-04-05 DIAGNOSIS — F32A Depression, unspecified: Secondary | ICD-10-CM | POA: Diagnosis not present

## 2023-04-05 DIAGNOSIS — Z87440 Personal history of urinary (tract) infections: Secondary | ICD-10-CM | POA: Diagnosis not present

## 2023-04-05 DIAGNOSIS — I4510 Unspecified right bundle-branch block: Secondary | ICD-10-CM | POA: Diagnosis not present

## 2023-04-05 DIAGNOSIS — Z792 Long term (current) use of antibiotics: Secondary | ICD-10-CM | POA: Diagnosis not present

## 2023-04-05 DIAGNOSIS — I10 Essential (primary) hypertension: Secondary | ICD-10-CM | POA: Diagnosis not present

## 2023-04-05 DIAGNOSIS — E7800 Pure hypercholesterolemia, unspecified: Secondary | ICD-10-CM | POA: Diagnosis not present

## 2023-04-05 DIAGNOSIS — L589 Radiodermatitis, unspecified: Secondary | ICD-10-CM | POA: Diagnosis not present

## 2023-04-05 DIAGNOSIS — Z853 Personal history of malignant neoplasm of breast: Secondary | ICD-10-CM | POA: Diagnosis not present

## 2023-04-05 DIAGNOSIS — I872 Venous insufficiency (chronic) (peripheral): Secondary | ICD-10-CM | POA: Diagnosis not present

## 2023-04-05 DIAGNOSIS — F5101 Primary insomnia: Secondary | ICD-10-CM | POA: Diagnosis not present

## 2023-04-05 DIAGNOSIS — Z79891 Long term (current) use of opiate analgesic: Secondary | ICD-10-CM | POA: Diagnosis not present

## 2023-04-09 DIAGNOSIS — N3020 Other chronic cystitis without hematuria: Secondary | ICD-10-CM | POA: Diagnosis not present

## 2023-04-11 ENCOUNTER — Telehealth: Payer: Self-pay

## 2023-04-11 ENCOUNTER — Ambulatory Visit
Admission: RE | Admit: 2023-04-11 | Discharge: 2023-04-11 | Disposition: A | Discharge status: Home / Self Care | Payer: Medicare Other | Source: Ambulatory Visit | Attending: Radiation Oncology | Admitting: Radiation Oncology

## 2023-04-11 ENCOUNTER — Encounter: Payer: Self-pay | Admitting: Radiation Oncology

## 2023-04-11 DIAGNOSIS — Z853 Personal history of malignant neoplasm of breast: Secondary | ICD-10-CM | POA: Diagnosis not present

## 2023-04-11 DIAGNOSIS — I10 Essential (primary) hypertension: Secondary | ICD-10-CM | POA: Diagnosis not present

## 2023-04-11 DIAGNOSIS — Z792 Long term (current) use of antibiotics: Secondary | ICD-10-CM | POA: Diagnosis not present

## 2023-04-11 DIAGNOSIS — D649 Anemia, unspecified: Secondary | ICD-10-CM | POA: Diagnosis not present

## 2023-04-11 DIAGNOSIS — I4510 Unspecified right bundle-branch block: Secondary | ICD-10-CM | POA: Diagnosis not present

## 2023-04-11 DIAGNOSIS — M150 Primary generalized (osteo)arthritis: Secondary | ICD-10-CM | POA: Diagnosis not present

## 2023-04-11 DIAGNOSIS — F5101 Primary insomnia: Secondary | ICD-10-CM | POA: Diagnosis not present

## 2023-04-11 DIAGNOSIS — G894 Chronic pain syndrome: Secondary | ICD-10-CM | POA: Diagnosis not present

## 2023-04-11 DIAGNOSIS — M549 Dorsalgia, unspecified: Secondary | ICD-10-CM | POA: Diagnosis not present

## 2023-04-11 DIAGNOSIS — E7800 Pure hypercholesterolemia, unspecified: Secondary | ICD-10-CM | POA: Diagnosis not present

## 2023-04-11 DIAGNOSIS — M109 Gout, unspecified: Secondary | ICD-10-CM | POA: Diagnosis not present

## 2023-04-11 DIAGNOSIS — Z79891 Long term (current) use of opiate analgesic: Secondary | ICD-10-CM | POA: Diagnosis not present

## 2023-04-11 DIAGNOSIS — E559 Vitamin D deficiency, unspecified: Secondary | ICD-10-CM | POA: Diagnosis not present

## 2023-04-11 DIAGNOSIS — J45909 Unspecified asthma, uncomplicated: Secondary | ICD-10-CM | POA: Diagnosis not present

## 2023-04-11 DIAGNOSIS — I872 Venous insufficiency (chronic) (peripheral): Secondary | ICD-10-CM | POA: Diagnosis not present

## 2023-04-11 DIAGNOSIS — Z7982 Long term (current) use of aspirin: Secondary | ICD-10-CM | POA: Diagnosis not present

## 2023-04-11 DIAGNOSIS — I4891 Unspecified atrial fibrillation: Secondary | ICD-10-CM | POA: Diagnosis not present

## 2023-04-11 DIAGNOSIS — Z87440 Personal history of urinary (tract) infections: Secondary | ICD-10-CM | POA: Diagnosis not present

## 2023-04-11 DIAGNOSIS — F32A Depression, unspecified: Secondary | ICD-10-CM | POA: Diagnosis not present

## 2023-04-11 DIAGNOSIS — L589 Radiodermatitis, unspecified: Secondary | ICD-10-CM | POA: Diagnosis not present

## 2023-04-11 NOTE — Telephone Encounter (Signed)
RN called pt for telephone follow up. Note completed and routed to Dr. Squire.  

## 2023-04-12 ENCOUNTER — Inpatient Hospital Stay: Payer: Medicare Other

## 2023-04-12 ENCOUNTER — Telehealth: Payer: Self-pay | Admitting: Hematology and Oncology

## 2023-04-12 ENCOUNTER — Encounter: Payer: Self-pay | Admitting: Hematology and Oncology

## 2023-04-12 ENCOUNTER — Inpatient Hospital Stay: Payer: Medicare Other | Attending: Hematology and Oncology | Admitting: Hematology and Oncology

## 2023-04-12 ENCOUNTER — Other Ambulatory Visit: Payer: Self-pay | Admitting: Hematology and Oncology

## 2023-04-12 ENCOUNTER — Other Ambulatory Visit: Payer: Self-pay

## 2023-04-12 VITALS — BP 157/74 | HR 98 | Temp 97.7°F | Resp 18 | Ht 60.0 in | Wt 280.0 lb

## 2023-04-12 DIAGNOSIS — Z9012 Acquired absence of left breast and nipple: Secondary | ICD-10-CM | POA: Diagnosis not present

## 2023-04-12 DIAGNOSIS — C50212 Malignant neoplasm of upper-inner quadrant of left female breast: Secondary | ICD-10-CM

## 2023-04-12 DIAGNOSIS — Z79818 Long term (current) use of other agents affecting estrogen receptors and estrogen levels: Secondary | ICD-10-CM | POA: Insufficient documentation

## 2023-04-12 DIAGNOSIS — M069 Rheumatoid arthritis, unspecified: Secondary | ICD-10-CM | POA: Insufficient documentation

## 2023-04-12 DIAGNOSIS — Z170 Estrogen receptor positive status [ER+]: Secondary | ICD-10-CM | POA: Insufficient documentation

## 2023-04-12 DIAGNOSIS — N2889 Other specified disorders of kidney and ureter: Secondary | ICD-10-CM | POA: Insufficient documentation

## 2023-04-12 DIAGNOSIS — E559 Vitamin D deficiency, unspecified: Secondary | ICD-10-CM | POA: Insufficient documentation

## 2023-04-12 DIAGNOSIS — M5459 Other low back pain: Secondary | ICD-10-CM | POA: Diagnosis not present

## 2023-04-12 DIAGNOSIS — G4730 Sleep apnea, unspecified: Secondary | ICD-10-CM | POA: Insufficient documentation

## 2023-04-12 DIAGNOSIS — M129 Arthropathy, unspecified: Secondary | ICD-10-CM | POA: Insufficient documentation

## 2023-04-12 DIAGNOSIS — I10 Essential (primary) hypertension: Secondary | ICD-10-CM | POA: Diagnosis not present

## 2023-04-12 DIAGNOSIS — M19071 Primary osteoarthritis, right ankle and foot: Secondary | ICD-10-CM | POA: Diagnosis not present

## 2023-04-12 DIAGNOSIS — I4510 Unspecified right bundle-branch block: Secondary | ICD-10-CM | POA: Diagnosis not present

## 2023-04-12 DIAGNOSIS — I4891 Unspecified atrial fibrillation: Secondary | ICD-10-CM | POA: Diagnosis not present

## 2023-04-12 DIAGNOSIS — Z7982 Long term (current) use of aspirin: Secondary | ICD-10-CM | POA: Insufficient documentation

## 2023-04-12 DIAGNOSIS — Z79811 Long term (current) use of aromatase inhibitors: Secondary | ICD-10-CM | POA: Insufficient documentation

## 2023-04-12 DIAGNOSIS — M19072 Primary osteoarthritis, left ankle and foot: Secondary | ICD-10-CM | POA: Diagnosis not present

## 2023-04-12 DIAGNOSIS — Z79899 Other long term (current) drug therapy: Secondary | ICD-10-CM | POA: Insufficient documentation

## 2023-04-12 LAB — COMPREHENSIVE METABOLIC PANEL
ALT: 15 U/L (ref 0–44)
AST: 17 U/L (ref 15–41)
Albumin: 3.9 g/dL (ref 3.5–5.0)
Alkaline Phosphatase: 90 U/L (ref 38–126)
Anion gap: 9 (ref 5–15)
BUN: 25 mg/dL — ABNORMAL HIGH (ref 8–23)
CO2: 27 mmol/L (ref 22–32)
Calcium: 9.9 mg/dL (ref 8.9–10.3)
Chloride: 104 mmol/L (ref 98–111)
Creatinine, Ser: 0.81 mg/dL (ref 0.44–1.00)
GFR, Estimated: >60 mL/min (ref >60)
Glucose, Bld: 154 mg/dL — ABNORMAL HIGH (ref 70–99)
Potassium: 3.2 mmol/L — ABNORMAL LOW (ref 3.5–5.1)
Sodium: 140 mmol/L (ref 135–145)
Total Bilirubin: 0.3 mg/dL (ref 0.3–1.2)
Total Protein: 6.2 g/dL — ABNORMAL LOW (ref 6.5–8.1)

## 2023-04-12 LAB — CBC WITH DIFFERENTIAL/PLATELET
Abs Immature Granulocytes: 0.03 10*3/uL (ref 0.00–0.07)
Basophils Absolute: 0 10*3/uL (ref 0.0–0.1)
Basophils Relative: 1 %
Eosinophils Absolute: 0.2 10*3/uL (ref 0.0–0.5)
Eosinophils Relative: 3 %
HCT: 39.1 % (ref 36.0–46.0)
Hemoglobin: 13 g/dL (ref 12.0–15.0)
Immature Granulocytes: 1 %
Lymphocytes Relative: 17 %
Lymphs Abs: 1.1 10*3/uL (ref 0.7–4.0)
MCH: 29.6 pg (ref 26.0–34.0)
MCHC: 33.2 g/dL (ref 30.0–36.0)
MCV: 89.1 fL (ref 80.0–100.0)
Monocytes Absolute: 0.6 10*3/uL (ref 0.1–1.0)
Monocytes Relative: 9 %
Neutro Abs: 4.7 10*3/uL (ref 1.7–7.7)
Neutrophils Relative %: 69 %
Platelets: 210 10*3/uL (ref 150–400)
RBC: 4.39 MIL/uL (ref 3.87–5.11)
RDW: 13.8 % (ref 11.5–15.5)
WBC: 6.6 10*3/uL (ref 4.0–10.5)
nRBC: 0 % (ref 0.0–0.2)

## 2023-04-12 MED ORDER — VENLAFAXINE HCL ER 75 MG PO CP24: 75.0000 mg | ORAL_CAPSULE | Freq: Every day | ORAL | 1 refills | Status: DC

## 2023-04-12 MED ORDER — CLONAZEPAM 1 MG PO TABS: 1.0000 mg | ORAL_TABLET | Freq: Every day | ORAL | 0 refills | Status: AC

## 2023-04-12 MED ORDER — FULVESTRANT 250 MG/5ML IM SOSY
500.0000 mg | PREFILLED_SYRINGE | Freq: Once | INTRAMUSCULAR | Status: AC
  Administered 2023-04-12: 500 mg via INTRAMUSCULAR
  Filled 2023-04-12: qty 10

## 2023-04-12 NOTE — Progress Notes (Signed)
[facility]  [phone] [phone]) [phone]     ID: ARIJAH FOYLE DOB: 1941-05-09  MR#: [id number]  NGE#:[id number]  Patient Care Team: Sigmund Hazel, MD as PCP - General (Family Medicine) [name], MD as Consulting Physician (General Surgery) Glenna Fellows, MD as Consulting Physician (Plastic Surgery) Ollen Gross, MD as Consulting Physician (Orthopedic Surgery) Pollyann Savoy, MD as Consulting Physician (Rheumatology) Axel Filler, Larna Daughters, NP as Nurse Practitioner (Hematology and Oncology) Alfredo Martinez, MD as Consulting Physician (Urology) Juanell Fairly, RN as Case Manager Rachel Moulds, MD as Consulting Physician (Hematology and Oncology) OTHER MD:   CHIEF COMPLAINT: Estrogen receptor positive breast cancer (s/p left mastectomy)  CURRENT TREATMENT: None  INTERVAL HISTORY:  Cairo returns today for follow-up of her estrogen receptor positive breast cancer.  She recently was found to have local recurrence and has reestablished with medical oncology PET didn't show any evidence of met disease.  She is now on faslodex for anti estrogen therapy. She is now status post reexcision with positive margins.  She will be seeing Dr. Dwain Sarna on 510.  She also has a follow-up appointment with Dr. Basilio Cairo today for consideration of radiation.She denies any issues with faslodex. Surgical site healing well.   COVID 19 VACCINATION STATUS: Pfizer x3 as of November 2022   BREAST CANCER HISTORY: From the original intake note:  Shmeka had minor trauma to the left breast but did not think much about it until while swimming on the medication she felt a hard mass in her left breast. She brought it to medical attention and on 05/04/2016 she underwent left diagnostic mammography with tomography at the breast Center. This showed the breast density to be category B. In the left breast upper inner quadrant there was a persistent area of asymmetry  measuring 4.9 cm. On exam this was a firm but poorly defined palpable mass. Biopsy of this mass 05/04/2016 showed ([id number]) an invasive ductal carcinoma, grade 1, estrogen receptor 90% positive with moderate staining intensity, progesterone receptor 90% positive with strong staining intensity, with an MIB-1 of 10%, and no HER-2 amplification, the signals ratio being 1.19 and the number per cell 1.90.  On 05/09/2016 the patient had ultrasonography of the left axilla which was benign. On the same day she had a second biopsy of what is either a second mass or a distant area of the same mass (in the same quadrant) and this showed ([id number]) invasive ductal carcinoma, grade 2, estrogen receptor 100% positive, progesterone receptor 90% positive, both with strong staining intensity, with an MIB-1 of 15%, and no HER-2 amplification, the signals ratio being 1.50 and the number per cell 3.15.  The patient's subsequent history is as detailed below.   PAST MEDICAL HISTORY: Past Medical History:  Diagnosis Date   Anemia    hx of    Anxiety    Breast cancer in female [facility]) 03/2016   left   Chronic bronchitis (HCC)    FLARE UPS USUALLY ONCE A YEAR   Chronic lower back pain    Complication of anesthesia    trouble waking up    CRI (chronic renal insufficiency) 07/27/2016   DDD (degenerative disc disease), lumbar    Depression    Dyspnea    History of recent fall    "twice on Sunday; once yesterday" (08/29/2016)   Hypertension    Inflammatory arthritis 07/27/2016   Sero Negative, Positive Synovitis hands, WJ   Insomnia    Irregular heart beats    Macular  degeneration of right eye    New onset atrial fibrillation (HCC)    OA (osteoarthritis) of hip 06/24/2012   OA (osteoarthritis) of knee 11/02/2013   L knee    Obesity    Osteoarthritis of both feet 07/27/2016   Osteoarthritis of both hands 07/27/2016   PONV (postoperative nausea and vomiting)    SEVERE N&V AND DIARRHEA AFTER  HYSTERECTOMY AND AFTER WISDOM TEETH EXTRACTIONS - NO PROBLEMS WITH LAST 2 SURGERIES - THE HIP AND LEFT KNEE   Postop Acute blood loss anemia 06/25/2012   Postop Hyponatremia 06/25/2012   Postop Sinus tachycardia 06/27/2012   RBBB (right bundle branch block) 07/27/2016   Rheumatoid arthritis (HCC)    "doctor recently took me off all RX for this" (08/02/2016)   Right bundle branch block    Right bundle branch block    S/p dental crown    dental crowns on every tooth   Status post total bilateral knee replacement 11/13/2013   Vitamin D deficiency     PAST SURGICAL HISTORY: Past Surgical History:  Procedure Laterality Date   AXILLARY SURGERY Left 06/25/2016   Aspiration of left axillary seroma    BREAST BIOPSY Left 03/2016   BREAST CYST EXCISION Left 11/26/2022   Procedure: LEFT CHEST WALL MASS EXCISION;  Surgeon: [name], MD;  Location: [facility] OR;  Service: General;  Laterality: Left;   BREAST LUMPECTOMY WITH RADIOACTIVE SEED AND SENTINEL LYMPH NODE BIOPSY Left 06/13/2016   Procedure: LEFT BREAST LUMPECTOMY WITH BRACKETED  RADIOACTIVE SEED AND SENTINEL LYMPH NODE BIOPSY;  Surgeon: [name], MD;  Location: [facility] OR;  Service: General;  Laterality: Left;   BREAST RECONSTRUCTION Bilateral 06/25/2016   BILATERAL ONCOPLASTIC BREAST RECONSTRUCTION WITH BREAST REDUCTION   BREAST RECONSTRUCTION WITH PLACEMENT OF TISSUE EXPANDER AND FLEX HD (ACELLULAR HYDRATED DERMIS) Bilateral 06/25/2016   Procedure: BILATERAL ONCOPLASTIC BREAST RECONSTRUCTION WITH BREAST REDUCTION, Aspiration of left axillary seroma;  Surgeon: Glenna Fellows, MD;  Location: [facility] OR;  Service: Plastics;  Laterality: Bilateral;   BREAST REDUCTION SURGERY Bilateral 06/25/2016   Procedure: MAMMARY REDUCTION  (BREAST) BILATERAL;  Surgeon: Glenna Fellows, MD;  Location: [facility] OR;  Service: Plastics;  Laterality: Bilateral;   CESAREAN SECTION  1966; 1971; 1973   COLONOSCOPY     DEBRIDEMENT AND CLOSURE WOUND Right  07/11/2016   Procedure: DEBRIDEMENT LEFT BREAST;  Surgeon: Glenna Fellows, MD;  Location: [facility] OR;  Service: Plastics;  Laterality: Right;   EYE SURGERY     INTERSTIM IMPLANT PLACEMENT N/A 08/29/2021   Procedure: Leane Platt IMPLANT FIRST STAGE;  Surgeon: Alfredo Martinez, MD;  Location: [facility] ORS;  Service: Urology;  Laterality: N/A;   INTERSTIM IMPLANT PLACEMENT N/A 08/29/2021   Procedure: Leane Platt IMPLANT SECOND STAGE AND IMPEDANCE CHECK;  Surgeon: Alfredo Martinez, MD;  Location: [facility];  Service: Urology;  Laterality: N/A;   JOINT REPLACEMENT     KNEE ARTHROSCOPY Right    "before replacement"   MASS EXCISION Left 12/10/2022   Procedure: LEFT CHEST WALL MASS MARGIN EXCISION;  Surgeon: [name], MD;  Location: [facility];  Service: General;  Laterality: Left;   MASTECTOMY COMPLETE / SIMPLE Left 08/02/2016   total   RE-EXCISION OF BREAST CANCER,SUPERIOR MARGINS Left 01/10/2023   Procedure: RE-EXCISION OF LEFT CHEST WALL BREAST CANCER;  Surgeon: [name], MD;  [facility] OR;  Service: General;  Laterality: Left;   REDUCTION MAMMAPLASTY Bilateral 06/25/2016   TOTAL ABDOMINAL HYSTERECTOMY W/ BILATERAL SALPINGOOPHORECTOMY     TOTAL HIP ARTHROPLASTY  06/24/2012   Procedure: TOTAL  HIP ARTHROPLASTY;  Surgeon: Loanne Drilling, MD;  Location: [facility];  Service: Orthopedics;  Laterality: Left;   TOTAL KNEE ARTHROPLASTY N/A 11/02/2013   Procedure: LEFT TOTAL KNEE ARTHROPLASTY WITH RIGHT KNEE CORTISONE INJECTION;  Surgeon: Loanne Drilling, MD;  Location: [facility];  Service: Orthopedics;  Laterality: N/A;   TOTAL KNEE ARTHROPLASTY Right 05/31/2014   Procedure: RIGHT TOTAL KNEE ARTHROPLASTY;  Surgeon: Loanne Drilling, MD;  Location: [facility];  Service: Orthopedics;  Laterality: Right;   TOTAL MASTECTOMY Left 08/02/2016   Procedure: LEFT TOTAL MASTECTOMY;  Surgeon: [name], MD;  Location: [facility] OR;  Service: General;  Laterality: Left;   TUBAL LIGATION  1973   WISDOM TOOTH EXTRACTION   1970's   admitted to hospital for surgery    FAMILY HISTORY Family History  Problem Relation Age of Onset   Hypertension Mother    Prostate cancer Father    Hypertension Father    Heart Problems Father        quadruple bypass   Hypertension Sister    The patient's mother died at age 6. The patient's father died at age 3 following a stroke. The patient had no brothers, 1 sister. There is no history of breast or ovarian cancer in the family.    GYNECOLOGIC HISTORY:  No LMP recorded. Patient has had a hysterectomy. Menarche age 39, first live birth age 80. The patient is GX P3. She underwent total abdominal hysterectomy, with bilateral salpingo-oophorectomy, in 1993. She used estrogen replacement approximately 9 months. She never used oral contraceptives.   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 Her husband Fredrik Cove used to r be in businesses but he also is retired. Son Arlys John is a Armed forces logistics/support/administrative officer in [employer]. Son Casimiro Needle is a IT trainer in [employer]. Daughter Devoiry Reisch lives in [place]. 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 The patient has 5 grandchildren. She is a Air traffic controller, currently attending [employer]    ADVANCED DIRECTIVES: In place    HEALTH MAINTENANCE: Social History   Tobacco Use   Smoking status: Never   Smokeless tobacco: Never  Vaping Use   Vaping status: Never Used  Substance Use Topics   Alcohol use: No   Drug use: Not Currently     Colonoscopy: 2015/[facility]  PAP:  Bone density: [facility] 04[facility]17[facility]2017 that showed: T-score of -1.8.    Allergies  Allergen Reactions   Morphine And Codeine Other (See Comments)    hallucinations   Other Swelling and Other (See Comments)    sterling silver - swelling, pain, infection     Current Outpatient Medications  Medication Sig Dispense Refill   aspirin EC 81 MG tablet Take 81 mg by mouth daily. Swallow whole.      Calcium Carbonate Antacid (TUMS PO) Take 1 tablet by mouth as needed (heartburn).     Cholecalciferol (VITAMIN D) 125 MCG (5000 UT) CAPS Take 5,000 Units by mouth daily.     clonazePAM (KLONOPIN) 1 MG tablet Take 1 mg by mouth daily.     cyclobenzaprine (FLEXERIL) 10 MG tablet Take 20 mg by mouth at bedtime.     ferrous sulfate 325 (65 FE) MG EC tablet Take 325 mg by mouth daily.     furosemide (LASIX) 20 MG tablet Take 20 mg by mouth daily.     HYDROcodone-acetaminophen (NORCO) 10-325 MG tablet Take 1 tablet by mouth 5 (five) times daily  as needed for moderate pain.     metoprolol tartrate (LOPRESSOR) 25 MG tablet Take 25 mg by mouth 2 (two) times daily.      Multiple Vitamins-Minerals (PRESERVISION AREDS 2+MULTI VIT PO) Take 1 capsule by mouth in the morning and at bedtime.     nitrofurantoin (MACRODANTIN) 100 MG capsule Take 1 capsule (100 mg total) by mouth at bedtime. (Patient taking differently: Take 100 mg by mouth daily.)     OVER THE COUNTER MEDICATION Take 1 tablet by mouth daily. Bone up     OVER THE COUNTER MEDICATION Take 2 tablets by mouth daily as needed (constipation). Swiss Kriss     rosuvastatin (CRESTOR) 5 MG tablet Take 5 mg by mouth daily.     triamterene-hydrochlorothiazide (MAXZIDE-25) 37.5-25 MG tablet Take 1 tablet by mouth daily.     valsartan (DIOVAN) 320 MG tablet Take 320 mg by mouth daily.     venlafaxine XR (EFFEXOR-XR) 37.5 MG 24 hr capsule TAKE 1 CAPSULE BY MOUTH ONCE DAILY FOR 7 DAYS , THEN MAY INCREASE TO 2 CAPSULES ONCE DAILY 49 capsule 0   zolpidem (AMBIEN) 10 MG tablet Take 10 mg by mouth at bedtime.     No current facility-administered medications for this visit.   Facility-Administered Medications Ordered in Other Visits  Medication Dose Route Frequency Provider Last Rate Last Admin   methocarbamol (ROBAXIN) tablet 500 mg  500 mg Oral Q6H PRN [name], MD       metroNIDAZOLE (FLAGYL) tablet 500 mg  500 mg Oral Q8H [name], MD        ondansetron (ZOFRAN-ODT) disintegrating tablet 4 mg  4 mg Oral Q6H PRN [name], MD       Or   ondansetron [facility]) 4 mg in sodium chloride 0.9 % 50 mL IVPB  4 mg Intravenous Q6H PRN [name], MD       oxyCODONE (Oxy IR/ROXICODONE) immediate release tablet 5-10 mg  5-10 mg Oral Q4H PRN [name], MD       simethicone [facility]) chewable tablet 40 mg  40 mg Oral Q6H PRN [name], MD        OBJECTIVE: White woman using a Rollator  Vitals:   04/12/23 1329  BP: (!) 157/74  Pulse: 98  Resp: 18  Temp: 97.7 F (36.5 C)  SpO2: 96%      Wt Readings from Last 3 Encounters:  04/12/23 280 lb (127 kg)  01/10/23 255 lb (115.7 kg)  01/04/23 255 lb (115.7 kg)   Body mass index is 54.68 kg/m.    ECOG FS:1 - Symptomatic but completely ambulatory  She is in a wheel chair, comfortable and in no acute distress. No lower extremity edema  LAB RESULTS:  CMP     Component Value Date/Time   NA 140 04/12/2023 1253   NA 140 07/04/2017 1112   K 3.2 (L) 04/12/2023 1253   K 4.0 07/04/2017 1112   CL 104 04/12/2023 1253   CO2 27 04/12/2023 1253   CO2 28 07/04/2017 1112   GLUCOSE 154 (H) 04/12/2023 1253   GLUCOSE 106 07/04/2017 1112   BUN 25 (H) 04/12/2023 1253   BUN 26.7 (H) 07/04/2017 1112   CREATININE 0.81 04/12/2023 1253   CREATININE 0.99 08/14/2021 1120   CREATININE 1.1 07/04/2017 1112   CALCIUM 9.9 04/12/2023 1253   CALCIUM 9.8 07/04/2017 1112   PROT 6.2 (L) 04/12/2023 1253   PROT 6.9 07/04/2017 1112   ALBUMIN 3.9 04/12/2023 1253   ALBUMIN 3.8  07/04/2017 1112   AST 17 04/12/2023 1253   AST 20 08/14/2021 1120   AST 21 07/04/2017 1112   ALT 15 04/12/2023 1253   ALT 24 08/14/2021 1120   ALT 29 07/04/2017 1112   ALKPHOS 90 04/12/2023 1253   ALKPHOS 87 07/04/2017 1112   BILITOT 0.3 04/12/2023 1253   BILITOT 0.5 08/14/2021 1120   BILITOT 0.37 07/04/2017 1112   GFRNONAA >60 04/12/2023 1253   GFRNONAA 58 (L) 08/14/2021 1120   GFRAA >60  06/08/2020 0857   GFRAA 53 (L) 02/05/2018 1012    INo results found for: "SPEP", "UPEP"  Lab Results  Component Value Date   WBC 6.6 04/12/2023   NEUTROABS 4.7 04/12/2023   HGB 13.0 04/12/2023   HCT 39.1 04/12/2023   MCV 89.1 04/12/2023   PLT 210 04/12/2023      Chemistry      Component Value Date/Time   NA 140 04/12/2023 1253   NA 140 07/04/2017 1112   K 3.2 (L) 04/12/2023 1253   K 4.0 07/04/2017 1112   CL 104 04/12/2023 1253   CO2 27 04/12/2023 1253   CO2 28 07/04/2017 1112   BUN 25 (H) 04/12/2023 1253   BUN 26.7 (H) 07/04/2017 1112   CREATININE 0.81 04/12/2023 1253   CREATININE 0.99 08/14/2021 1120   CREATININE 1.1 07/04/2017 1112   GLU 95 01/20/2016 0000      Component Value Date/Time   CALCIUM 9.9 04/12/2023 1253   CALCIUM 9.8 07/04/2017 1112   ALKPHOS 90 04/12/2023 1253   ALKPHOS 87 07/04/2017 1112   AST 17 04/12/2023 1253   AST 20 08/14/2021 1120   AST 21 07/04/2017 1112   ALT 15 04/12/2023 1253   ALT 24 08/14/2021 1120   ALT 29 07/04/2017 1112   BILITOT 0.3 04/12/2023 1253   BILITOT 0.5 08/14/2021 1120   BILITOT 0.37 07/04/2017 1112       No results found for: "LABCA2"  No components found for: "LABCA125"  No results for input(s): "INR" in the last 168 hours.  Urinalysis    Component Value Date/Time   COLORURINE YELLOW 08/29/2016 2120   APPEARANCEUR HAZY (A) 08/29/2016 2120   LABSPEC 1.014 08/29/2016 2120   PHURINE 5.0 08/29/2016 2120   GLUCOSEU NEGATIVE 08/29/2016 2120   HGBUR NEGATIVE 08/29/2016 2120   BILIRUBINUR n 01/25/2020 1101   KETONESUR NEGATIVE 08/29/2016 2120   PROTEINUR Negative 01/25/2020 1101   PROTEINUR NEGATIVE 08/29/2016 2120   UROBILINOGEN negative (A) 01/25/2020 1101   UROBILINOGEN 0.2 05/26/2014 1054   NITRITE n 01/25/2020 1101   NITRITE NEGATIVE 08/29/2016 2120   LEUKOCYTESUR Small (1+) (A) 01/25/2020 1101    STUDIES: No results found.   ELIGIBLE FOR AVAILABLE RESEARCH PROTOCOL: no  ASSESSMENT: 82 y.o.  [place] woman status post left breast upper inner quadrant biopsy 05/04/2016 for a clinical T2 N0 invasive ductal carcinoma, grade 1 estrogen and progesterone receptor positive, HER-2 nonamplified, with an MIB-1 of 10%.  (1) biopsy of a second area 05/09/2016 also in the upper inner quadrant of the left breast showed invasive ductal carcinoma, grade 2, estrogen and progesterone receptor positive, HER-2 nonamplified, with an MIB-1 of 15%.  (2) status post left lumpectomy and sentinel lymph node sampling 06/13/2016 for an mpT1c pN0(i+), stage IA invasive ductal carcinoma, grade 1, with close but negative margins  (a) status post bilateral reduction mammoplasty with left oncoplastic surgery 06/25/2016  (b) status post debridement of left nipple/areolar necrosis 07/11/2016  (c) status post left simple mastectomy 08/02/2016 showing  inflammation and abscess but no evidence of malignancy  (3) Oncotype score of 18 predicts a 10 year risk of recurrence outside the breast of 11% if the patient's only systemic therapy is tamoxifen for 5 years. It also predicts no significant benefit from chemotherapy  (4) postmastectomy radiation not indicated  (5) started anastrozole September 2017, completing five years November 2022  (a) DEXA scan 04/17/2017 showed a T score of -1.8   (B DEXA scan 01/26/2020) showed a T score of -2.2.  (6) She had a dermatology appointment on December 12 and had some lesions noted which were shave biopsied from the left chest wall. Pathology from that showed dermal deposits of metastatic breast adenocarcinoma extending to the edge and base, ER +95% 3+ PR 80% 3+ HER2 2+ by IHC.  Her 2 FISH negative.  PET scan with no metastatic disease.  She was started on Faslodex for antiestrogen therapy.  PLAN: She had recent left chest wall reexcision which showed skin and underlying soft tissue involved by patient's known breast cancer.  Inked superior margin and lateral taper involved by  carcinoma. She had excision, margin positive for cancer. After much review Dr Dwain Sarna recommended considering palliative radiation. She was agreeable to this. She will meet Dr Basilio Cairo today Although there is no clear guideline suggesting role of CDK4/6 inhibitors, we discussed briefly that we can try them at the lowest dose possible if she is willing to be aggressive. She is not sure of this at this time.  We can talk later again Continue faslodex every 28 days and FU with me in 2 months.  Total time spent: 30 min  *Total Encounter Time as defined by the Centers for Medicare and Medicaid Services includes, in addition to the face-to-face time of a patient visit (documented in the note above) non-face-to-face time: obtaining and reviewing outside history, ordering and reviewing medications, tests or procedures, care coordination (communications with other health care professionals or caregivers) and documentation in the medical record.

## 2023-04-12 NOTE — Progress Notes (Signed)
Pt requested transfer of care for mammograms to [facility]. Order placed and in basket sent to fax the order.  Thanks,

## 2023-04-12 NOTE — Telephone Encounter (Signed)
Patient is aware of upcoming appointment times/dates.  

## 2023-04-12 NOTE — Progress Notes (Signed)
[facility]  [phone] [phone]     ID: ANGELINNE LECATES DOB: 08-31-1941  MR#: [id number]  YQI#:[id number]  Patient Care Team: Sigmund Hazel, MD as PCP - General (Family Medicine) [name], MD as Consulting Physician (General Surgery) Glenna Fellows, MD as Consulting Physician (Plastic Surgery) Ollen Gross, MD as Consulting Physician (Orthopedic Surgery) Pollyann Savoy, MD as Consulting Physician (Rheumatology) Axel Filler, Larna Daughters, NP as Nurse Practitioner (Hematology and Oncology) Alfredo Martinez, MD as Consulting Physician (Urology) Juanell Fairly, RN as Case Manager Rachel Moulds, MD as Consulting Physician (Hematology and Oncology) OTHER MD:   CHIEF COMPLAINT: Estrogen receptor positive breast cancer (s/p left mastectomy)  CURRENT TREATMENT: None  INTERVAL HISTORY:  Karaline returns today for follow-up of her estrogen receptor positive breast cancer.  She is on antiestrogen therapy with Faslodex, progressed while on anastrozole and she is not quite a candidate for tamoxifen because she is very sedentary.  She also had recently completed radiation and she has a chronic nonhealing wound of the left chest wall which has been looking much better according to husband.  He has been doing good dressings as well as using Neosporin, also following up with Dr. Dwain Sarna regularly.  She denies any major adverse effects with Faslodex.  She once again had questions as to why we are doing this.  She also requests a refill for venlafaxine as well as short refill for Klonopin while she is establishing with her new healthcare provider.   COVID 19 VACCINATION STATUS: Pfizer x3 as of November 2022   BREAST CANCER HISTORY: From the original intake note:  Velisa had minor trauma to the left breast but did not think much about it until while swimming on the medication she felt a hard mass in her left breast. She brought it to medical  attention and on 05/04/2016 she underwent left diagnostic mammography with tomography at the breast Center. This showed the breast density to be category B. In the left breast upper inner quadrant there was a persistent area of asymmetry measuring 4.9 cm. On exam this was a firm but poorly defined palpable mass. Biopsy of this mass 05/04/2016 showed ([id number]) an invasive ductal carcinoma, grade 1, estrogen receptor 90% positive with moderate staining intensity, progesterone receptor 90% positive with strong staining intensity, with an MIB-1 of 10%, and no HER-2 amplification, the signals ratio being 1.19 and the number per cell 1.90.  On 05/09/2016 the patient had ultrasonography of the left axilla which was benign. On the same day she had a second biopsy of what is either a second mass or a distant area of the same mass (in the same quadrant) and this showed ([id number]) invasive ductal carcinoma, grade 2, estrogen receptor 100% positive, progesterone receptor 90% positive, both with strong staining intensity, with an MIB-1 of 15%, and no HER-2 amplification, the signals ratio being 1.50 and the number per cell 3.15.  The patient's subsequent history is as detailed below.   PAST MEDICAL HISTORY: Past Medical History:  Diagnosis Date   Anemia    hx of    Anxiety    Breast cancer in female [facility]) 03/2016   left   Chronic bronchitis (HCC)    FLARE UPS USUALLY ONCE A YEAR   Chronic lower back pain    Complication of anesthesia    trouble waking up    CRI (chronic renal insufficiency) 07/27/2016   DDD (degenerative disc disease), lumbar    Depression    Dyspnea  History of recent fall    "twice on Sunday; once yesterday" (08/29/2016)   Hypertension    Inflammatory arthritis 07/27/2016   Sero Negative, Positive Synovitis hands, WJ   Insomnia    Irregular heart beats    Macular degeneration of right eye    New onset atrial fibrillation (HCC)    OA (osteoarthritis) of hip 06/24/2012    OA (osteoarthritis) of knee 11/02/2013   L knee    Obesity    Osteoarthritis of both feet 07/27/2016   Osteoarthritis of both hands 07/27/2016   PONV (postoperative nausea and vomiting)    SEVERE N&V AND DIARRHEA AFTER HYSTERECTOMY AND AFTER WISDOM TEETH EXTRACTIONS - NO PROBLEMS WITH LAST 2 SURGERIES - THE HIP AND LEFT KNEE   Postop Acute blood loss anemia 06/25/2012   Postop Hyponatremia 06/25/2012   Postop Sinus tachycardia 06/27/2012   RBBB (right bundle branch block) 07/27/2016   Rheumatoid arthritis (HCC)    "doctor recently took me off all RX for this" (08/02/2016)   Right bundle branch block    Right bundle branch block    S/p dental crown    dental crowns on every tooth   Status post total bilateral knee replacement 11/13/2013   Vitamin D deficiency     PAST SURGICAL HISTORY: Past Surgical History:  Procedure Laterality Date   AXILLARY SURGERY Left 06/25/2016   Aspiration of left axillary seroma    BREAST BIOPSY Left 03/2016   BREAST CYST EXCISION Left 11/26/2022   Procedure: LEFT CHEST WALL MASS EXCISION;  Surgeon: [name], MD;  Location: [facility] OR;  Service: General;  Laterality: Left;   BREAST LUMPECTOMY WITH RADIOACTIVE SEED AND SENTINEL LYMPH NODE BIOPSY Left 06/13/2016   Procedure: LEFT BREAST LUMPECTOMY WITH BRACKETED  RADIOACTIVE SEED AND SENTINEL LYMPH NODE BIOPSY;  Surgeon: [name], MD;  Location: [facility] OR;  Service: General;  Laterality: Left;   BREAST RECONSTRUCTION Bilateral 06/25/2016   BILATERAL ONCOPLASTIC BREAST RECONSTRUCTION WITH BREAST REDUCTION   BREAST RECONSTRUCTION WITH PLACEMENT OF TISSUE EXPANDER AND FLEX HD (ACELLULAR HYDRATED DERMIS) Bilateral 06/25/2016   Procedure: BILATERAL ONCOPLASTIC BREAST RECONSTRUCTION WITH BREAST REDUCTION, Aspiration of left axillary seroma;  Surgeon: Glenna Fellows, MD;  Location: [facility] OR;  Service: Plastics;  Laterality: Bilateral;   BREAST REDUCTION SURGERY Bilateral 06/25/2016   Procedure: MAMMARY  REDUCTION  (BREAST) BILATERAL;  Surgeon: Glenna Fellows, MD;  Location: [facility] OR;  Service: Plastics;  Laterality: Bilateral;   CESAREAN SECTION  1966; 1971; 1973   COLONOSCOPY     DEBRIDEMENT AND CLOSURE WOUND Right 07/11/2016   Procedure: DEBRIDEMENT LEFT BREAST;  Surgeon: Glenna Fellows, MD;  Location: [facility] OR;  Service: Plastics;  Laterality: Right;   EYE SURGERY     INTERSTIM IMPLANT PLACEMENT N/A 08/29/2021   Procedure: Leane Platt IMPLANT FIRST STAGE;  Surgeon: Alfredo Martinez, MD;  Location: [facility] ORS;  Service: Urology;  Laterality: N/A;   INTERSTIM IMPLANT PLACEMENT N/A 08/29/2021   Procedure: Leane Platt IMPLANT SECOND STAGE AND IMPEDANCE CHECK;  Surgeon: Alfredo Martinez, MD;  Location: [facility];  Service: Urology;  Laterality: N/A;   JOINT REPLACEMENT     KNEE ARTHROSCOPY Right    "before replacement"   MASS EXCISION Left 12/10/2022   Procedure: LEFT CHEST WALL MASS MARGIN EXCISION;  Surgeon: [name], MD;  Location: [facility];  Service: General;  Laterality: Left;   MASTECTOMY COMPLETE / SIMPLE Left 08/02/2016   total   RE-EXCISION OF BREAST CANCER,SUPERIOR MARGINS Left 01/10/2023   Procedure: RE-EXCISION OF LEFT CHEST WALL BREAST  CANCER;  Surgeon: [name], MD;  Location[facility] OR;  Service: General;  Laterality: Left;   REDUCTION MAMMAPLASTY Bilateral 06/25/2016   TOTAL ABDOMINAL HYSTERECTOMY W/ BILATERAL SALPINGOOPHORECTOMY     TOTAL HIP ARTHROPLASTY  06/24/2012   Procedure: TOTAL HIP ARTHROPLASTY;  Surgeon: Loanne Drilling, MD;  Location: [facility];  Service: Orthopedics;  Laterality: Left;   TOTAL KNEE ARTHROPLASTY N/A 11/02/2013   Procedure: LEFT TOTAL KNEE ARTHROPLASTY WITH RIGHT KNEE CORTISONE INJECTION;  Surgeon: Loanne Drilling, MD;  Location: [facility];  Service: Orthopedics;  Laterality: N/A;   TOTAL KNEE ARTHROPLASTY Right 05/31/2014   Procedure: RIGHT TOTAL KNEE ARTHROPLASTY;  Surgeon: Loanne Drilling, MD;  Location: [facility];  Service: Orthopedics;  Laterality:  Right;   TOTAL MASTECTOMY Left 08/02/2016   Procedure: LEFT TOTAL MASTECTOMY;  Surgeon: [name], MD;  Location: [facility] OR;  Service: General;  Laterality: Left;   TUBAL LIGATION  1973   WISDOM TOOTH EXTRACTION  1970's   admitted to hospital for surgery    FAMILY HISTORY Family History  Problem Relation Age of Onset   Hypertension Mother    Prostate cancer Father    Hypertension Father    Heart Problems Father        quadruple bypass   Hypertension Sister    The patient's mother died at age 62. The patient's father died at age 61 following a stroke. The patient had no brothers, 1 sister. There is no history of breast or ovarian cancer in the family.    GYNECOLOGIC HISTORY:  No LMP recorded. Patient has had a hysterectomy. Menarche age 34, first live birth age 28. The patient is GX P3. She underwent total abdominal hysterectomy, with bilateral salpingo-oophorectomy, in 1993. She used estrogen replacement approximately 9 months. She never used oral contraceptives.   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 Her husband Fredrik Cove used to r be in businesses but he also is retired. Son Arlys John is a Armed forces logistics/support/administrative officer in [place]. Son Casimiro Needle is a IT trainer in [facility]. Daughter Tynetta Menz lives in [place]. 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 The patient has 5 grandchildren. She is a Air traffic controller, currently attending [employer]    ADVANCED DIRECTIVES: In place    HEALTH MAINTENANCE: Social History   Tobacco Use   Smoking status: Never   Smokeless tobacco: Never  Vaping Use   Vaping status: Never Used  Substance Use Topics   Alcohol use: No   Drug use: Not Currently     Colonoscopy: 2015/[facility]  PAP:  Bone density: [facility]17[facility]2017 that showed: T-score of -1.8.    Allergies  Allergen Reactions   Morphine And Codeine Other (See Comments)    hallucinations   Other Swelling  and Other (See Comments)    sterling silver - swelling, pain, infection     Current Outpatient Medications  Medication Sig Dispense Refill   aspirin EC 81 MG tablet Take 81 mg by mouth daily. Swallow whole.     Calcium Carbonate Antacid (TUMS PO) Take 1 tablet by mouth as needed (heartburn).     Cholecalciferol (VITAMIN D) 125 MCG (5000 UT) CAPS Take 5,000 Units by mouth daily.     clonazePAM (KLONOPIN) 1 MG tablet Take 1 mg by mouth daily.     cyclobenzaprine (FLEXERIL) 10 MG tablet Take 20 mg by mouth at bedtime.     ferrous sulfate 325 (65  FE) MG EC tablet Take 325 mg by mouth daily.     furosemide (LASIX) 20 MG tablet Take 20 mg by mouth daily.     HYDROcodone-acetaminophen (NORCO) 10-325 MG tablet Take 1 tablet by mouth 5 (five) times daily as needed for moderate pain.     metoprolol tartrate (LOPRESSOR) 25 MG tablet Take 25 mg by mouth 2 (two) times daily.      Multiple Vitamins-Minerals (PRESERVISION AREDS 2+MULTI VIT PO) Take 1 capsule by mouth in the morning and at bedtime.     nitrofurantoin (MACRODANTIN) 100 MG capsule Take 1 capsule (100 mg total) by mouth at bedtime. (Patient taking differently: Take 100 mg by mouth daily.)     OVER THE COUNTER MEDICATION Take 1 tablet by mouth daily. Bone up     OVER THE COUNTER MEDICATION Take 2 tablets by mouth daily as needed (constipation). Swiss Kriss     rosuvastatin (CRESTOR) 5 MG tablet Take 5 mg by mouth daily.     triamterene-hydrochlorothiazide (MAXZIDE-25) 37.5-25 MG tablet Take 1 tablet by mouth daily.     valsartan (DIOVAN) 320 MG tablet Take 320 mg by mouth daily.     venlafaxine XR (EFFEXOR-XR) 37.5 MG 24 hr capsule TAKE 1 CAPSULE BY MOUTH ONCE DAILY FOR 7 DAYS , THEN MAY INCREASE TO 2 CAPSULES ONCE DAILY 49 capsule 0   zolpidem (AMBIEN) 10 MG tablet Take 10 mg by mouth at bedtime.     No current facility-administered medications for this visit.   Facility-Administered Medications Ordered in Other Visits  Medication Dose  Route Frequency Provider Last Rate Last Admin   methocarbamol (ROBAXIN) tablet 500 mg  500 mg Oral Q6H PRN [name], MD       metroNIDAZOLE (FLAGYL) tablet 500 mg  500 mg Oral Q8H [name], MD       ondansetron (ZOFRAN-ODT) disintegrating tablet 4 mg  4 mg Oral Q6H PRN [name], MD       Or   ondansetron [facility]) 4 mg in sodium chloride 0.9 % 50 mL IVPB  4 mg Intravenous Q6H PRN [name], MD       oxyCODONE (Oxy IR/ROXICODONE) immediate release tablet 5-10 mg  5-10 mg Oral Q4H PRN [name], MD       simethicone [facility]) chewable tablet 40 mg  40 mg Oral Q6H PRN [name], MD        OBJECTIVE: White woman using a Rollator  There were no vitals filed for this visit.    Wt Readings from Last 3 Encounters:  01/10/23 255 lb (115.7 kg)  01/04/23 255 lb (115.7 kg)  12/10/22 255 lb (115.7 kg)   There is no height or weight on file to calculate BMI.    ECOG FS:1 - Symptomatic but completely ambulatory  She is in a wheel chair, comfortable and in no acute distress. Left chest wall appears well, small areas of dehiscence with pale granulation tissue.  No concern for infection Bilateral chronic lower extremity edema Chest: Clear to auscultation bilaterally  LAB RESULTS:  CMP     Component Value Date/Time   NA 140 04/12/2023 1253   NA 140 07/04/2017 1112   K 3.2 (L) 04/12/2023 1253   K 4.0 07/04/2017 1112   CL 104 04/12/2023 1253   CO2 27 04/12/2023 1253   CO2 28 07/04/2017 1112   GLUCOSE 154 (H) 04/12/2023 1253   GLUCOSE 106 07/04/2017 1112   BUN 25 (H) 04/12/2023 1253   BUN 26.7 (H) 07/04/2017  1112   CREATININE 0.81 04/12/2023 1253   CREATININE 0.99 08/14/2021 1120   CREATININE 1.1 07/04/2017 1112   CALCIUM 9.9 04/12/2023 1253   CALCIUM 9.8 07/04/2017 1112   PROT 6.2 (L) 04/12/2023 1253   PROT 6.9 07/04/2017 1112   ALBUMIN 3.9 04/12/2023 1253   ALBUMIN 3.8 07/04/2017 1112   AST 17 04/12/2023 1253   AST 20  08/14/2021 1120   AST 21 07/04/2017 1112   ALT 15 04/12/2023 1253   ALT 24 08/14/2021 1120   ALT 29 07/04/2017 1112   ALKPHOS 90 04/12/2023 1253   ALKPHOS 87 07/04/2017 1112   BILITOT 0.3 04/12/2023 1253   BILITOT 0.5 08/14/2021 1120   BILITOT 0.37 07/04/2017 1112   GFRNONAA >60 04/12/2023 1253   GFRNONAA 58 (L) 08/14/2021 1120   GFRAA >60 06/08/2020 0857   GFRAA 53 (L) 02/05/2018 1012    INo results found for: "SPEP", "UPEP"  Lab Results  Component Value Date   WBC 6.6 04/12/2023   NEUTROABS 4.7 04/12/2023   HGB 13.0 04/12/2023   HCT 39.1 04/12/2023   MCV 89.1 04/12/2023   PLT 210 04/12/2023      Chemistry      Component Value Date/Time   NA 140 04/12/2023 1253   NA 140 07/04/2017 1112   K 3.2 (L) 04/12/2023 1253   K 4.0 07/04/2017 1112   CL 104 04/12/2023 1253   CO2 27 04/12/2023 1253   CO2 28 07/04/2017 1112   BUN 25 (H) 04/12/2023 1253   BUN 26.7 (H) 07/04/2017 1112   CREATININE 0.81 04/12/2023 1253   CREATININE 0.99 08/14/2021 1120   CREATININE 1.1 07/04/2017 1112   GLU 95 01/20/2016 0000      Component Value Date/Time   CALCIUM 9.9 04/12/2023 1253   CALCIUM 9.8 07/04/2017 1112   ALKPHOS 90 04/12/2023 1253   ALKPHOS 87 07/04/2017 1112   AST 17 04/12/2023 1253   AST 20 08/14/2021 1120   AST 21 07/04/2017 1112   ALT 15 04/12/2023 1253   ALT 24 08/14/2021 1120   ALT 29 07/04/2017 1112   BILITOT 0.3 04/12/2023 1253   BILITOT 0.5 08/14/2021 1120   BILITOT 0.37 07/04/2017 1112       No results found for: "LABCA2"  No components found for: "LABCA125"  No results for input(s): "INR" in the last 168 hours.  Urinalysis    Component Value Date/Time   COLORURINE YELLOW 08/29/2016 2120   APPEARANCEUR HAZY (A) 08/29/2016 2120   LABSPEC 1.014 08/29/2016 2120   PHURINE 5.0 08/29/2016 2120   GLUCOSEU NEGATIVE 08/29/2016 2120   HGBUR NEGATIVE 08/29/2016 2120   BILIRUBINUR n 01/25/2020 1101   KETONESUR NEGATIVE 08/29/2016 2120   PROTEINUR Negative  01/25/2020 1101   PROTEINUR NEGATIVE 08/29/2016 2120   UROBILINOGEN negative (A) 01/25/2020 1101   UROBILINOGEN 0.2 05/26/2014 1054   NITRITE n 01/25/2020 1101   NITRITE NEGATIVE 08/29/2016 2120   LEUKOCYTESUR Small (1+) (A) 01/25/2020 1101    STUDIES: No results found.   ELIGIBLE FOR AVAILABLE RESEARCH PROTOCOL: no  ASSESSMENT: 82 y.o. [place] woman status post left breast upper inner quadrant biopsy 05/04/2016 for a clinical T2 N0 invasive ductal carcinoma, grade 1 estrogen and progesterone receptor positive, HER-2 nonamplified, with an MIB-1 of 10%.  (1) biopsy of a second area 05/09/2016 also in the upper inner quadrant of the left breast showed invasive ductal carcinoma, grade 2, estrogen and progesterone receptor positive, HER-2 nonamplified, with an MIB-1 of 15%.  (2) status post left  lumpectomy and sentinel lymph node sampling 06/13/2016 for an mpT1c pN0(i+), stage IA invasive ductal carcinoma, grade 1, with close but negative margins  (a) status post bilateral reduction mammoplasty with left oncoplastic surgery 06/25/2016  (b) status post debridement of left nipple/areolar necrosis 07/11/2016  (c) status post left simple mastectomy 08/02/2016 showing inflammation and abscess but no evidence of malignancy  (3) Oncotype score of 18 predicts a 10 year risk of recurrence outside the breast of 11% if the patient's only systemic therapy is tamoxifen for 5 years. It also predicts no significant benefit from chemotherapy  (4) postmastectomy radiation not indicated  (5) started anastrozole September 2017, completing five years November 2022  (a) DEXA scan 04/17/2017 showed a T score of -1.8   (B DEXA scan 01/26/2020) showed a T score of -2.2.  (6) She had a dermatology appointment on December 12 and had some lesions noted which were shave biopsied from the left chest wall. Pathology from that showed dermal deposits of metastatic breast adenocarcinoma extending to the edge and  base, ER +95% 3+ PR 80% 3+ HER2 2+ by IHC.  Her 2 FISH negative.  PET scan with no metastatic disease.  She was started on Faslodex for antiestrogen therapy.  PLAN:  She had recent left chest wall reexcision which showed skin and underlying soft tissue involved by patient's known breast cancer.  Inked superior margin and lateral taper involved by carcinoma. She had excision, margin positive for cancer. After much review Dr Dwain Sarna recommended considering palliative radiation.  She is now post adjuvant radiation.  Although there is no clear guideline suggesting role of CDK4/6 inhibitors, we discussed briefly that we can try them at the lowest dose possible if she is willing to be aggressive.  She did not want to do this.  She wants to continue Faslodex every 28 days.  Today she requests a refill for Effexor as well as a small refill for Klonopin while she is establishing with her new PCP.  Overall the left chest wall wound appears to be much better, small areas of dehiscence with pale granulation tissue, no concern for infection.  Total time spent: 30 min  *Total Encounter Time as defined by the Centers for Medicare and Medicaid Services includes, in addition to the face-to-face time of a patient visit (documented in the note above) non-face-to-face time: obtaining and reviewing outside history, ordering and reviewing medications, tests or procedures, care coordination (communications with other health care professionals or caregivers) and documentation in the medical record.

## 2023-04-17 DIAGNOSIS — Z87440 Personal history of urinary (tract) infections: Secondary | ICD-10-CM | POA: Diagnosis not present

## 2023-04-17 DIAGNOSIS — L589 Radiodermatitis, unspecified: Secondary | ICD-10-CM | POA: Diagnosis not present

## 2023-04-17 DIAGNOSIS — I872 Venous insufficiency (chronic) (peripheral): Secondary | ICD-10-CM | POA: Diagnosis not present

## 2023-04-17 DIAGNOSIS — Z79891 Long term (current) use of opiate analgesic: Secondary | ICD-10-CM | POA: Diagnosis not present

## 2023-04-17 DIAGNOSIS — I4891 Unspecified atrial fibrillation: Secondary | ICD-10-CM | POA: Diagnosis not present

## 2023-04-17 DIAGNOSIS — F32A Depression, unspecified: Secondary | ICD-10-CM | POA: Diagnosis not present

## 2023-04-17 DIAGNOSIS — J45909 Unspecified asthma, uncomplicated: Secondary | ICD-10-CM | POA: Diagnosis not present

## 2023-04-17 DIAGNOSIS — G894 Chronic pain syndrome: Secondary | ICD-10-CM | POA: Diagnosis not present

## 2023-04-17 DIAGNOSIS — I10 Essential (primary) hypertension: Secondary | ICD-10-CM | POA: Diagnosis not present

## 2023-04-17 DIAGNOSIS — Z7982 Long term (current) use of aspirin: Secondary | ICD-10-CM | POA: Diagnosis not present

## 2023-04-17 DIAGNOSIS — M549 Dorsalgia, unspecified: Secondary | ICD-10-CM | POA: Diagnosis not present

## 2023-04-17 DIAGNOSIS — M150 Primary generalized (osteo)arthritis: Secondary | ICD-10-CM | POA: Diagnosis not present

## 2023-04-17 DIAGNOSIS — Z792 Long term (current) use of antibiotics: Secondary | ICD-10-CM | POA: Diagnosis not present

## 2023-04-17 DIAGNOSIS — E7800 Pure hypercholesterolemia, unspecified: Secondary | ICD-10-CM | POA: Diagnosis not present

## 2023-04-17 DIAGNOSIS — I4510 Unspecified right bundle-branch block: Secondary | ICD-10-CM | POA: Diagnosis not present

## 2023-04-17 DIAGNOSIS — E559 Vitamin D deficiency, unspecified: Secondary | ICD-10-CM | POA: Diagnosis not present

## 2023-04-17 DIAGNOSIS — Z853 Personal history of malignant neoplasm of breast: Secondary | ICD-10-CM | POA: Diagnosis not present

## 2023-04-17 DIAGNOSIS — F5101 Primary insomnia: Secondary | ICD-10-CM | POA: Diagnosis not present

## 2023-04-17 DIAGNOSIS — D649 Anemia, unspecified: Secondary | ICD-10-CM | POA: Diagnosis not present

## 2023-04-17 DIAGNOSIS — M109 Gout, unspecified: Secondary | ICD-10-CM | POA: Diagnosis not present

## 2023-04-24 DIAGNOSIS — Z87440 Personal history of urinary (tract) infections: Secondary | ICD-10-CM | POA: Diagnosis not present

## 2023-04-24 DIAGNOSIS — J45909 Unspecified asthma, uncomplicated: Secondary | ICD-10-CM | POA: Diagnosis not present

## 2023-04-24 DIAGNOSIS — I872 Venous insufficiency (chronic) (peripheral): Secondary | ICD-10-CM | POA: Diagnosis not present

## 2023-04-24 DIAGNOSIS — I4510 Unspecified right bundle-branch block: Secondary | ICD-10-CM | POA: Diagnosis not present

## 2023-04-24 DIAGNOSIS — F32A Depression, unspecified: Secondary | ICD-10-CM | POA: Diagnosis not present

## 2023-04-24 DIAGNOSIS — M549 Dorsalgia, unspecified: Secondary | ICD-10-CM | POA: Diagnosis not present

## 2023-04-24 DIAGNOSIS — Z7982 Long term (current) use of aspirin: Secondary | ICD-10-CM | POA: Diagnosis not present

## 2023-04-24 DIAGNOSIS — E559 Vitamin D deficiency, unspecified: Secondary | ICD-10-CM | POA: Diagnosis not present

## 2023-04-24 DIAGNOSIS — M109 Gout, unspecified: Secondary | ICD-10-CM | POA: Diagnosis not present

## 2023-04-24 DIAGNOSIS — M150 Primary generalized (osteo)arthritis: Secondary | ICD-10-CM | POA: Diagnosis not present

## 2023-04-24 DIAGNOSIS — I10 Essential (primary) hypertension: Secondary | ICD-10-CM | POA: Diagnosis not present

## 2023-04-24 DIAGNOSIS — Z792 Long term (current) use of antibiotics: Secondary | ICD-10-CM | POA: Diagnosis not present

## 2023-04-24 DIAGNOSIS — F5101 Primary insomnia: Secondary | ICD-10-CM | POA: Diagnosis not present

## 2023-04-24 DIAGNOSIS — L589 Radiodermatitis, unspecified: Secondary | ICD-10-CM | POA: Diagnosis not present

## 2023-04-24 DIAGNOSIS — Z79891 Long term (current) use of opiate analgesic: Secondary | ICD-10-CM | POA: Diagnosis not present

## 2023-04-24 DIAGNOSIS — G894 Chronic pain syndrome: Secondary | ICD-10-CM | POA: Diagnosis not present

## 2023-04-24 DIAGNOSIS — D649 Anemia, unspecified: Secondary | ICD-10-CM | POA: Diagnosis not present

## 2023-04-24 DIAGNOSIS — I4891 Unspecified atrial fibrillation: Secondary | ICD-10-CM | POA: Diagnosis not present

## 2023-04-24 DIAGNOSIS — Z853 Personal history of malignant neoplasm of breast: Secondary | ICD-10-CM | POA: Diagnosis not present

## 2023-04-24 DIAGNOSIS — E7800 Pure hypercholesterolemia, unspecified: Secondary | ICD-10-CM | POA: Diagnosis not present

## 2023-04-29 DIAGNOSIS — L589 Radiodermatitis, unspecified: Secondary | ICD-10-CM | POA: Diagnosis not present

## 2023-04-29 DIAGNOSIS — E7800 Pure hypercholesterolemia, unspecified: Secondary | ICD-10-CM | POA: Diagnosis not present

## 2023-04-29 DIAGNOSIS — I4891 Unspecified atrial fibrillation: Secondary | ICD-10-CM | POA: Diagnosis not present

## 2023-04-29 DIAGNOSIS — M150 Primary generalized (osteo)arthritis: Secondary | ICD-10-CM | POA: Diagnosis not present

## 2023-04-29 DIAGNOSIS — J45909 Unspecified asthma, uncomplicated: Secondary | ICD-10-CM | POA: Diagnosis not present

## 2023-04-29 DIAGNOSIS — I4510 Unspecified right bundle-branch block: Secondary | ICD-10-CM | POA: Diagnosis not present

## 2023-04-29 DIAGNOSIS — Z79891 Long term (current) use of opiate analgesic: Secondary | ICD-10-CM | POA: Diagnosis not present

## 2023-04-29 DIAGNOSIS — Z792 Long term (current) use of antibiotics: Secondary | ICD-10-CM | POA: Diagnosis not present

## 2023-04-29 DIAGNOSIS — M109 Gout, unspecified: Secondary | ICD-10-CM | POA: Diagnosis not present

## 2023-04-29 DIAGNOSIS — Z853 Personal history of malignant neoplasm of breast: Secondary | ICD-10-CM | POA: Diagnosis not present

## 2023-04-29 DIAGNOSIS — Z7982 Long term (current) use of aspirin: Secondary | ICD-10-CM | POA: Diagnosis not present

## 2023-04-29 DIAGNOSIS — I10 Essential (primary) hypertension: Secondary | ICD-10-CM | POA: Diagnosis not present

## 2023-04-29 DIAGNOSIS — I872 Venous insufficiency (chronic) (peripheral): Secondary | ICD-10-CM | POA: Diagnosis not present

## 2023-04-29 DIAGNOSIS — M549 Dorsalgia, unspecified: Secondary | ICD-10-CM | POA: Diagnosis not present

## 2023-04-29 DIAGNOSIS — F5101 Primary insomnia: Secondary | ICD-10-CM | POA: Diagnosis not present

## 2023-04-29 DIAGNOSIS — F32A Depression, unspecified: Secondary | ICD-10-CM | POA: Diagnosis not present

## 2023-04-29 DIAGNOSIS — E559 Vitamin D deficiency, unspecified: Secondary | ICD-10-CM | POA: Diagnosis not present

## 2023-04-29 DIAGNOSIS — D649 Anemia, unspecified: Secondary | ICD-10-CM | POA: Diagnosis not present

## 2023-04-29 DIAGNOSIS — Z87440 Personal history of urinary (tract) infections: Secondary | ICD-10-CM | POA: Diagnosis not present

## 2023-04-29 DIAGNOSIS — G894 Chronic pain syndrome: Secondary | ICD-10-CM | POA: Diagnosis not present

## 2023-04-30 DIAGNOSIS — H35722 Serous detachment of retinal pigment epithelium, left eye: Secondary | ICD-10-CM | POA: Diagnosis not present

## 2023-04-30 DIAGNOSIS — Z9889 Other specified postprocedural states: Secondary | ICD-10-CM | POA: Diagnosis not present

## 2023-04-30 DIAGNOSIS — H353134 Nonexudative age-related macular degeneration, bilateral, advanced atrophic with subfoveal involvement: Secondary | ICD-10-CM | POA: Diagnosis not present

## 2023-04-30 DIAGNOSIS — H353212 Exudative age-related macular degeneration, right eye, with inactive choroidal neovascularization: Secondary | ICD-10-CM | POA: Diagnosis not present

## 2023-05-02 DIAGNOSIS — R350 Frequency of micturition: Secondary | ICD-10-CM | POA: Diagnosis not present

## 2023-05-02 DIAGNOSIS — N3020 Other chronic cystitis without hematuria: Secondary | ICD-10-CM | POA: Diagnosis not present

## 2023-05-02 DIAGNOSIS — T83110D Breakdown (mechanical) of urinary electronic stimulator device, subsequent encounter: Secondary | ICD-10-CM | POA: Diagnosis not present

## 2023-05-02 DIAGNOSIS — R8271 Bacteriuria: Secondary | ICD-10-CM | POA: Diagnosis not present

## 2023-05-07 DIAGNOSIS — E559 Vitamin D deficiency, unspecified: Secondary | ICD-10-CM | POA: Diagnosis not present

## 2023-05-07 DIAGNOSIS — F5101 Primary insomnia: Secondary | ICD-10-CM | POA: Diagnosis not present

## 2023-05-07 DIAGNOSIS — Z7982 Long term (current) use of aspirin: Secondary | ICD-10-CM | POA: Diagnosis not present

## 2023-05-07 DIAGNOSIS — I4891 Unspecified atrial fibrillation: Secondary | ICD-10-CM | POA: Diagnosis not present

## 2023-05-07 DIAGNOSIS — Z853 Personal history of malignant neoplasm of breast: Secondary | ICD-10-CM | POA: Diagnosis not present

## 2023-05-07 DIAGNOSIS — Z792 Long term (current) use of antibiotics: Secondary | ICD-10-CM | POA: Diagnosis not present

## 2023-05-07 DIAGNOSIS — G894 Chronic pain syndrome: Secondary | ICD-10-CM | POA: Diagnosis not present

## 2023-05-07 DIAGNOSIS — J45909 Unspecified asthma, uncomplicated: Secondary | ICD-10-CM | POA: Diagnosis not present

## 2023-05-07 DIAGNOSIS — D649 Anemia, unspecified: Secondary | ICD-10-CM | POA: Diagnosis not present

## 2023-05-07 DIAGNOSIS — M109 Gout, unspecified: Secondary | ICD-10-CM | POA: Diagnosis not present

## 2023-05-07 DIAGNOSIS — I872 Venous insufficiency (chronic) (peripheral): Secondary | ICD-10-CM | POA: Diagnosis not present

## 2023-05-07 DIAGNOSIS — M150 Primary generalized (osteo)arthritis: Secondary | ICD-10-CM | POA: Diagnosis not present

## 2023-05-07 DIAGNOSIS — I10 Essential (primary) hypertension: Secondary | ICD-10-CM | POA: Diagnosis not present

## 2023-05-07 DIAGNOSIS — E7800 Pure hypercholesterolemia, unspecified: Secondary | ICD-10-CM | POA: Diagnosis not present

## 2023-05-07 DIAGNOSIS — I4510 Unspecified right bundle-branch block: Secondary | ICD-10-CM | POA: Diagnosis not present

## 2023-05-07 DIAGNOSIS — M549 Dorsalgia, unspecified: Secondary | ICD-10-CM | POA: Diagnosis not present

## 2023-05-07 DIAGNOSIS — L589 Radiodermatitis, unspecified: Secondary | ICD-10-CM | POA: Diagnosis not present

## 2023-05-07 DIAGNOSIS — Z87440 Personal history of urinary (tract) infections: Secondary | ICD-10-CM | POA: Diagnosis not present

## 2023-05-07 DIAGNOSIS — F32A Depression, unspecified: Secondary | ICD-10-CM | POA: Diagnosis not present

## 2023-05-07 DIAGNOSIS — Z79891 Long term (current) use of opiate analgesic: Secondary | ICD-10-CM | POA: Diagnosis not present

## 2023-05-08 DIAGNOSIS — R7303 Prediabetes: Secondary | ICD-10-CM | POA: Diagnosis not present

## 2023-05-08 DIAGNOSIS — G894 Chronic pain syndrome: Secondary | ICD-10-CM | POA: Diagnosis not present

## 2023-05-08 DIAGNOSIS — N3020 Other chronic cystitis without hematuria: Secondary | ICD-10-CM | POA: Diagnosis not present

## 2023-05-08 DIAGNOSIS — Z7409 Other reduced mobility: Secondary | ICD-10-CM | POA: Diagnosis not present

## 2023-05-08 DIAGNOSIS — I10 Essential (primary) hypertension: Secondary | ICD-10-CM | POA: Diagnosis not present

## 2023-05-08 DIAGNOSIS — Z23 Encounter for immunization: Secondary | ICD-10-CM | POA: Diagnosis not present

## 2023-05-08 DIAGNOSIS — Z9181 History of falling: Secondary | ICD-10-CM | POA: Diagnosis not present

## 2023-05-08 DIAGNOSIS — C50212 Malignant neoplasm of upper-inner quadrant of left female breast: Secondary | ICD-10-CM | POA: Diagnosis not present

## 2023-05-08 DIAGNOSIS — Z0000 Encounter for general adult medical examination without abnormal findings: Secondary | ICD-10-CM | POA: Diagnosis not present

## 2023-05-09 DIAGNOSIS — H353114 Nonexudative age-related macular degeneration, right eye, advanced atrophic with subfoveal involvement: Secondary | ICD-10-CM | POA: Diagnosis not present

## 2023-05-09 DIAGNOSIS — H353124 Nonexudative age-related macular degeneration, left eye, advanced atrophic with subfoveal involvement: Secondary | ICD-10-CM | POA: Diagnosis not present

## 2023-05-14 DIAGNOSIS — Z87440 Personal history of urinary (tract) infections: Secondary | ICD-10-CM | POA: Diagnosis not present

## 2023-05-14 DIAGNOSIS — I872 Venous insufficiency (chronic) (peripheral): Secondary | ICD-10-CM | POA: Diagnosis not present

## 2023-05-14 DIAGNOSIS — J45909 Unspecified asthma, uncomplicated: Secondary | ICD-10-CM | POA: Diagnosis not present

## 2023-05-14 DIAGNOSIS — Z79891 Long term (current) use of opiate analgesic: Secondary | ICD-10-CM | POA: Diagnosis not present

## 2023-05-14 DIAGNOSIS — M150 Primary generalized (osteo)arthritis: Secondary | ICD-10-CM | POA: Diagnosis not present

## 2023-05-14 DIAGNOSIS — D649 Anemia, unspecified: Secondary | ICD-10-CM | POA: Diagnosis not present

## 2023-05-14 DIAGNOSIS — L589 Radiodermatitis, unspecified: Secondary | ICD-10-CM | POA: Diagnosis not present

## 2023-05-14 DIAGNOSIS — E7800 Pure hypercholesterolemia, unspecified: Secondary | ICD-10-CM | POA: Diagnosis not present

## 2023-05-14 DIAGNOSIS — Z7982 Long term (current) use of aspirin: Secondary | ICD-10-CM | POA: Diagnosis not present

## 2023-05-14 DIAGNOSIS — I10 Essential (primary) hypertension: Secondary | ICD-10-CM | POA: Diagnosis not present

## 2023-05-14 DIAGNOSIS — M109 Gout, unspecified: Secondary | ICD-10-CM | POA: Diagnosis not present

## 2023-05-14 DIAGNOSIS — E559 Vitamin D deficiency, unspecified: Secondary | ICD-10-CM | POA: Diagnosis not present

## 2023-05-14 DIAGNOSIS — M549 Dorsalgia, unspecified: Secondary | ICD-10-CM | POA: Diagnosis not present

## 2023-05-14 DIAGNOSIS — F32A Depression, unspecified: Secondary | ICD-10-CM | POA: Diagnosis not present

## 2023-05-14 DIAGNOSIS — Z853 Personal history of malignant neoplasm of breast: Secondary | ICD-10-CM | POA: Diagnosis not present

## 2023-05-14 DIAGNOSIS — I4891 Unspecified atrial fibrillation: Secondary | ICD-10-CM | POA: Diagnosis not present

## 2023-05-14 DIAGNOSIS — G894 Chronic pain syndrome: Secondary | ICD-10-CM | POA: Diagnosis not present

## 2023-05-14 DIAGNOSIS — I4510 Unspecified right bundle-branch block: Secondary | ICD-10-CM | POA: Diagnosis not present

## 2023-05-22 DIAGNOSIS — Z853 Personal history of malignant neoplasm of breast: Secondary | ICD-10-CM | POA: Diagnosis not present

## 2023-05-22 DIAGNOSIS — M150 Primary generalized (osteo)arthritis: Secondary | ICD-10-CM | POA: Diagnosis not present

## 2023-05-22 DIAGNOSIS — Z87440 Personal history of urinary (tract) infections: Secondary | ICD-10-CM | POA: Diagnosis not present

## 2023-05-22 DIAGNOSIS — M549 Dorsalgia, unspecified: Secondary | ICD-10-CM | POA: Diagnosis not present

## 2023-05-22 DIAGNOSIS — J45909 Unspecified asthma, uncomplicated: Secondary | ICD-10-CM | POA: Diagnosis not present

## 2023-05-22 DIAGNOSIS — I4510 Unspecified right bundle-branch block: Secondary | ICD-10-CM | POA: Diagnosis not present

## 2023-05-22 DIAGNOSIS — Z7982 Long term (current) use of aspirin: Secondary | ICD-10-CM | POA: Diagnosis not present

## 2023-05-22 DIAGNOSIS — D649 Anemia, unspecified: Secondary | ICD-10-CM | POA: Diagnosis not present

## 2023-05-22 DIAGNOSIS — E7800 Pure hypercholesterolemia, unspecified: Secondary | ICD-10-CM | POA: Diagnosis not present

## 2023-05-22 DIAGNOSIS — Z79891 Long term (current) use of opiate analgesic: Secondary | ICD-10-CM | POA: Diagnosis not present

## 2023-05-22 DIAGNOSIS — I4891 Unspecified atrial fibrillation: Secondary | ICD-10-CM | POA: Diagnosis not present

## 2023-05-22 DIAGNOSIS — I872 Venous insufficiency (chronic) (peripheral): Secondary | ICD-10-CM | POA: Diagnosis not present

## 2023-05-22 DIAGNOSIS — E559 Vitamin D deficiency, unspecified: Secondary | ICD-10-CM | POA: Diagnosis not present

## 2023-05-22 DIAGNOSIS — M109 Gout, unspecified: Secondary | ICD-10-CM | POA: Diagnosis not present

## 2023-05-22 DIAGNOSIS — I10 Essential (primary) hypertension: Secondary | ICD-10-CM | POA: Diagnosis not present

## 2023-05-22 DIAGNOSIS — G894 Chronic pain syndrome: Secondary | ICD-10-CM | POA: Diagnosis not present

## 2023-05-22 DIAGNOSIS — L589 Radiodermatitis, unspecified: Secondary | ICD-10-CM | POA: Diagnosis not present

## 2023-05-23 ENCOUNTER — Inpatient Hospital Stay: Payer: Medicare Other | Attending: Hematology and Oncology

## 2023-05-23 ENCOUNTER — Other Ambulatory Visit: Payer: Self-pay | Admitting: Hematology and Oncology

## 2023-05-23 VITALS — BP 152/82 | HR 91 | Temp 98.6°F | Resp 18

## 2023-05-23 DIAGNOSIS — Z79818 Long term (current) use of other agents affecting estrogen receptors and estrogen levels: Secondary | ICD-10-CM | POA: Diagnosis not present

## 2023-05-23 DIAGNOSIS — Z170 Estrogen receptor positive status [ER+]: Secondary | ICD-10-CM | POA: Diagnosis not present

## 2023-05-23 DIAGNOSIS — C50212 Malignant neoplasm of upper-inner quadrant of left female breast: Secondary | ICD-10-CM | POA: Insufficient documentation

## 2023-05-23 MED ORDER — FULVESTRANT 250 MG/5ML IM SOSY
500.0000 mg | PREFILLED_SYRINGE | Freq: Once | INTRAMUSCULAR | Status: AC
  Administered 2023-05-23: 500 mg via INTRAMUSCULAR

## 2023-05-23 NOTE — Progress Notes (Signed)
Faslodex re ordered.

## 2023-05-29 DIAGNOSIS — E559 Vitamin D deficiency, unspecified: Secondary | ICD-10-CM | POA: Diagnosis not present

## 2023-05-29 DIAGNOSIS — Z7982 Long term (current) use of aspirin: Secondary | ICD-10-CM | POA: Diagnosis not present

## 2023-05-29 DIAGNOSIS — I4510 Unspecified right bundle-branch block: Secondary | ICD-10-CM | POA: Diagnosis not present

## 2023-05-29 DIAGNOSIS — G894 Chronic pain syndrome: Secondary | ICD-10-CM | POA: Diagnosis not present

## 2023-05-29 DIAGNOSIS — M109 Gout, unspecified: Secondary | ICD-10-CM | POA: Diagnosis not present

## 2023-05-29 DIAGNOSIS — Z79891 Long term (current) use of opiate analgesic: Secondary | ICD-10-CM | POA: Diagnosis not present

## 2023-05-29 DIAGNOSIS — M150 Primary generalized (osteo)arthritis: Secondary | ICD-10-CM | POA: Diagnosis not present

## 2023-05-29 DIAGNOSIS — I872 Venous insufficiency (chronic) (peripheral): Secondary | ICD-10-CM | POA: Diagnosis not present

## 2023-05-29 DIAGNOSIS — J45909 Unspecified asthma, uncomplicated: Secondary | ICD-10-CM | POA: Diagnosis not present

## 2023-05-29 DIAGNOSIS — D649 Anemia, unspecified: Secondary | ICD-10-CM | POA: Diagnosis not present

## 2023-05-29 DIAGNOSIS — I10 Essential (primary) hypertension: Secondary | ICD-10-CM | POA: Diagnosis not present

## 2023-05-29 DIAGNOSIS — Z87440 Personal history of urinary (tract) infections: Secondary | ICD-10-CM | POA: Diagnosis not present

## 2023-05-29 DIAGNOSIS — L589 Radiodermatitis, unspecified: Secondary | ICD-10-CM | POA: Diagnosis not present

## 2023-05-29 DIAGNOSIS — Z853 Personal history of malignant neoplasm of breast: Secondary | ICD-10-CM | POA: Diagnosis not present

## 2023-05-29 DIAGNOSIS — I4891 Unspecified atrial fibrillation: Secondary | ICD-10-CM | POA: Diagnosis not present

## 2023-05-29 DIAGNOSIS — M549 Dorsalgia, unspecified: Secondary | ICD-10-CM | POA: Diagnosis not present

## 2023-05-29 DIAGNOSIS — E7800 Pure hypercholesterolemia, unspecified: Secondary | ICD-10-CM | POA: Diagnosis not present

## 2023-05-30 DIAGNOSIS — C50112 Malignant neoplasm of central portion of left female breast: Secondary | ICD-10-CM | POA: Diagnosis not present

## 2023-05-30 DIAGNOSIS — Z170 Estrogen receptor positive status [ER+]: Secondary | ICD-10-CM | POA: Diagnosis not present

## 2023-06-01 ENCOUNTER — Other Ambulatory Visit: Payer: Self-pay | Admitting: Hematology and Oncology

## 2023-06-03 DIAGNOSIS — Z7982 Long term (current) use of aspirin: Secondary | ICD-10-CM | POA: Diagnosis not present

## 2023-06-03 DIAGNOSIS — G894 Chronic pain syndrome: Secondary | ICD-10-CM | POA: Diagnosis not present

## 2023-06-03 DIAGNOSIS — D649 Anemia, unspecified: Secondary | ICD-10-CM | POA: Diagnosis not present

## 2023-06-03 DIAGNOSIS — Z79891 Long term (current) use of opiate analgesic: Secondary | ICD-10-CM | POA: Diagnosis not present

## 2023-06-03 DIAGNOSIS — I872 Venous insufficiency (chronic) (peripheral): Secondary | ICD-10-CM | POA: Diagnosis not present

## 2023-06-03 DIAGNOSIS — J45909 Unspecified asthma, uncomplicated: Secondary | ICD-10-CM | POA: Diagnosis not present

## 2023-06-03 DIAGNOSIS — I4891 Unspecified atrial fibrillation: Secondary | ICD-10-CM | POA: Diagnosis not present

## 2023-06-03 DIAGNOSIS — M150 Primary generalized (osteo)arthritis: Secondary | ICD-10-CM | POA: Diagnosis not present

## 2023-06-03 DIAGNOSIS — L589 Radiodermatitis, unspecified: Secondary | ICD-10-CM | POA: Diagnosis not present

## 2023-06-03 DIAGNOSIS — I4510 Unspecified right bundle-branch block: Secondary | ICD-10-CM | POA: Diagnosis not present

## 2023-06-03 DIAGNOSIS — I10 Essential (primary) hypertension: Secondary | ICD-10-CM | POA: Diagnosis not present

## 2023-06-03 DIAGNOSIS — Z87440 Personal history of urinary (tract) infections: Secondary | ICD-10-CM | POA: Diagnosis not present

## 2023-06-03 DIAGNOSIS — M549 Dorsalgia, unspecified: Secondary | ICD-10-CM | POA: Diagnosis not present

## 2023-06-03 DIAGNOSIS — Z853 Personal history of malignant neoplasm of breast: Secondary | ICD-10-CM | POA: Diagnosis not present

## 2023-06-03 DIAGNOSIS — M109 Gout, unspecified: Secondary | ICD-10-CM | POA: Diagnosis not present

## 2023-06-03 DIAGNOSIS — E7800 Pure hypercholesterolemia, unspecified: Secondary | ICD-10-CM | POA: Diagnosis not present

## 2023-06-03 DIAGNOSIS — E559 Vitamin D deficiency, unspecified: Secondary | ICD-10-CM | POA: Diagnosis not present

## 2023-06-04 DIAGNOSIS — L718 Other rosacea: Secondary | ICD-10-CM | POA: Diagnosis not present

## 2023-06-04 DIAGNOSIS — R208 Other disturbances of skin sensation: Secondary | ICD-10-CM | POA: Diagnosis not present

## 2023-06-04 DIAGNOSIS — L578 Other skin changes due to chronic exposure to nonionizing radiation: Secondary | ICD-10-CM | POA: Diagnosis not present

## 2023-06-04 DIAGNOSIS — L821 Other seborrheic keratosis: Secondary | ICD-10-CM | POA: Diagnosis not present

## 2023-06-04 DIAGNOSIS — D225 Melanocytic nevi of trunk: Secondary | ICD-10-CM | POA: Diagnosis not present

## 2023-06-04 DIAGNOSIS — D485 Neoplasm of uncertain behavior of skin: Secondary | ICD-10-CM | POA: Diagnosis not present

## 2023-06-04 DIAGNOSIS — L298 Other pruritus: Secondary | ICD-10-CM | POA: Diagnosis not present

## 2023-06-04 DIAGNOSIS — D2261 Melanocytic nevi of right upper limb, including shoulder: Secondary | ICD-10-CM | POA: Diagnosis not present

## 2023-06-04 DIAGNOSIS — D2262 Melanocytic nevi of left upper limb, including shoulder: Secondary | ICD-10-CM | POA: Diagnosis not present

## 2023-06-04 DIAGNOSIS — L538 Other specified erythematous conditions: Secondary | ICD-10-CM | POA: Diagnosis not present

## 2023-06-13 DIAGNOSIS — H35722 Serous detachment of retinal pigment epithelium, left eye: Secondary | ICD-10-CM | POA: Diagnosis not present

## 2023-06-13 DIAGNOSIS — H353114 Nonexudative age-related macular degeneration, right eye, advanced atrophic with subfoveal involvement: Secondary | ICD-10-CM | POA: Diagnosis not present

## 2023-06-13 DIAGNOSIS — Z9889 Other specified postprocedural states: Secondary | ICD-10-CM | POA: Diagnosis not present

## 2023-06-13 DIAGNOSIS — H3554 Dystrophies primarily involving the retinal pigment epithelium: Secondary | ICD-10-CM | POA: Diagnosis not present

## 2023-06-13 DIAGNOSIS — H353212 Exudative age-related macular degeneration, right eye, with inactive choroidal neovascularization: Secondary | ICD-10-CM | POA: Diagnosis not present

## 2023-06-13 DIAGNOSIS — H353124 Nonexudative age-related macular degeneration, left eye, advanced atrophic with subfoveal involvement: Secondary | ICD-10-CM | POA: Diagnosis not present

## 2023-06-20 ENCOUNTER — Other Ambulatory Visit: Payer: Self-pay

## 2023-06-20 ENCOUNTER — Inpatient Hospital Stay: Payer: Medicare Other | Attending: Hematology and Oncology

## 2023-06-20 ENCOUNTER — Inpatient Hospital Stay: Payer: Medicare Other | Admitting: Hematology and Oncology

## 2023-06-20 VITALS — BP 183/81 | HR 78 | Temp 97.8°F | Resp 18 | Ht 60.0 in | Wt 273.6 lb

## 2023-06-20 DIAGNOSIS — C792 Secondary malignant neoplasm of skin: Secondary | ICD-10-CM | POA: Diagnosis not present

## 2023-06-20 DIAGNOSIS — Z9012 Acquired absence of left breast and nipple: Secondary | ICD-10-CM | POA: Diagnosis not present

## 2023-06-20 DIAGNOSIS — Z79818 Long term (current) use of other agents affecting estrogen receptors and estrogen levels: Secondary | ICD-10-CM | POA: Diagnosis not present

## 2023-06-20 DIAGNOSIS — Z79811 Long term (current) use of aromatase inhibitors: Secondary | ICD-10-CM | POA: Diagnosis not present

## 2023-06-20 DIAGNOSIS — C799 Secondary malignant neoplasm of unspecified site: Secondary | ICD-10-CM

## 2023-06-20 DIAGNOSIS — Z7982 Long term (current) use of aspirin: Secondary | ICD-10-CM | POA: Insufficient documentation

## 2023-06-20 DIAGNOSIS — M1990 Unspecified osteoarthritis, unspecified site: Secondary | ICD-10-CM | POA: Insufficient documentation

## 2023-06-20 DIAGNOSIS — T402X5A Adverse effect of other opioids, initial encounter: Secondary | ICD-10-CM | POA: Diagnosis not present

## 2023-06-20 DIAGNOSIS — Z79899 Other long term (current) drug therapy: Secondary | ICD-10-CM | POA: Insufficient documentation

## 2023-06-20 DIAGNOSIS — Z170 Estrogen receptor positive status [ER+]: Secondary | ICD-10-CM | POA: Insufficient documentation

## 2023-06-20 DIAGNOSIS — C50212 Malignant neoplasm of upper-inner quadrant of left female breast: Secondary | ICD-10-CM

## 2023-06-20 DIAGNOSIS — C50919 Malignant neoplasm of unspecified site of unspecified female breast: Secondary | ICD-10-CM

## 2023-06-20 DIAGNOSIS — G894 Chronic pain syndrome: Secondary | ICD-10-CM | POA: Diagnosis not present

## 2023-06-20 DIAGNOSIS — M5459 Other low back pain: Secondary | ICD-10-CM | POA: Diagnosis not present

## 2023-06-20 MED ORDER — FULVESTRANT 250 MG/5ML IM SOSY
500.0000 mg | PREFILLED_SYRINGE | Freq: Once | INTRAMUSCULAR | Status: AC
  Administered 2023-06-20: 500 mg via INTRAMUSCULAR
  Filled 2023-06-20: qty 10

## 2023-06-20 NOTE — Assessment & Plan Note (Addendum)
05/04/2016: Left breast UIQ T2 N0 grade 1 IDC ER/PR positive HER2 negative Ki-67 10% 05/09/2016: Biopsy second area UIQ left breast grade 2 IDC ER/PR positive HER2 negative Ki-67 15% 06/13/2016: Left lumpectomy: Grade 1 IDC T1c N0, bilateral reduction mammoplasty 08/02/2016: Left mastectomy: No evidence of malignancy November 2017: Oncotype DX score 18 (11% risk of distant recurrence) September 2017: Anastrozole x 5 years completed November 2022 08/28/2016: Cutaneous diastases chest wall excision margin positive: Metastatic breast cancer ER 95%, PR 80%, HER2 2+ by IHC FISH negative  Current treatment: Faslodex monthly Cutaneous metastasis: I recommend that we do a PET CT scan annually.  I will set this up for January 2025.  Patient will come monthly for injection appointments and I will see her back in January after her scan. We discussed Signatera testing for monitoring her disease but she did not express any interest in that.

## 2023-06-20 NOTE — Progress Notes (Signed)
Patient Care Team: Sigmund Hazel, MD as PCP - General (Family Medicine) [name], MD as Consulting Physician (General Surgery) Glenna Fellows, MD as Consulting Physician (Plastic Surgery) Ollen Gross, MD as Consulting Physician (Orthopedic Surgery) Pollyann Savoy, MD as Consulting Physician (Rheumatology) Axel Filler Larna Daughters, NP as Nurse Practitioner (Hematology and Oncology) Alfredo Martinez, MD as Consulting Physician (Urology) Juanell Fairly, RN as Case Manager Rachel Moulds, MD as Consulting Physician (Hematology and Oncology)  DIAGNOSIS:  Encounter Diagnoses  Name Primary?   Malignant neoplasm of upper-inner quadrant of left breast in female, estrogen receptor positive (HCC) Yes   Metastasis from breast cancer (HCC)     SUMMARY OF ONCOLOGIC HISTORY: Oncology History  Malignant neoplasm of upper-inner quadrant of left breast in female, estrogen receptor positive (HCC)  05/04/2016 Initial Biopsy   Left breast biopsy: IDC, grade 1, ER+(90%), PR+(90%), Ki-67 10%, HER-2 negative (ratio 1.19).   05/09/2016 Initial Biopsy   Left breast second area of concern: ILC, grade 2, ER+(100%), PR+(90%), Ki-67 15%, HER-2 negative (ratio 1.50).   05/31/2016 Initial Diagnosis   Malignant neoplasm of upper-inner quadrant of left breast in female, estrogen receptor positive (HCC)   06/13/2016 Surgery   Left breast lumpectomy: IDC grade 1, two foci, 1.7cm and 1.5 cm, margins negative, lobular neoplasia, 1 SLN with isolated tumor cells, 1 SLN negative.   T1c, N0 (i+)   06/13/2016 Oncotype testing   18/11%, intermediate risk   05/2016 -  Anti-estrogen oral therapy   Anastrozole daily   06/13/2016 Cancer Staging   Staging form: Breast, AJCC 7th Edition - Pathologic stage from 06/13/2016: Stage IA (T1c, N0(i+), cM0) - Signed by Lonie Peak, MD on 01/19/2023 Stage prefix: Initial diagnosis Histologic grade (G): G1 Estrogen receptor status: Positive Progesterone receptor  status: Positive HER2 status: Negative   06/25/2016 Surgery   Bilateral reduction  mammoplasty   07/11/2016 Surgery   Debridement of left nipple necrosis   08/02/2016 Surgery   Left simple mastectomy showing inflammation and abscess but no malignancy   01/18/2023 Cancer Staging   Staging form: Breast, AJCC 7th Edition - Pathologic stage from 01/18/2023: Stage Unknown (rT4b, NX, cM0) - Signed by Lonie Peak, MD on 01/19/2023 Stage prefix: Recurrence Laterality: Left     CHIEF COMPLIANT: Follow-up on Faslodex   History of Present Illness   The patient, with a history of breast cancer treated with lumpectomy, mastectomy, and subsequently cutaneous recurrence in 2023, presents for a routine follow-up. She has been on Faslodex injections since her surgeries and reports no significant side effects, except for mild lethargy the day after the injections. The patient's husband confirms that the injections do not cause her any significant discomfort. The patient also mentions that she has arthritis, which causes her significant pain and affects her mobility. She reports taking Norco five times a day for the pain. The patient and her husband express a desire to stay informed about her condition and are open to undergoing tests that could detect recurrence early.     ALLERGIES:  is allergic to morphine and codeine and other.  MEDICATIONS:  Current Outpatient Medications  Medication Sig Dispense Refill   aspirin EC 81 MG tablet Take 81 mg by mouth daily. Swallow whole.     Calcium Carbonate Antacid (TUMS PO) Take 1 tablet by mouth as needed (heartburn).     Cholecalciferol (VITAMIN D) 125 MCG (5000 UT) CAPS Take 5,000 Units by mouth daily.     clonazePAM (KLONOPIN) 1 MG tablet Take 1 tablet (1 mg  total) by mouth daily. 30 tablet 0   cyclobenzaprine (FLEXERIL) 10 MG tablet Take 20 mg by mouth at bedtime.     ferrous sulfate 325 (65 FE) MG EC tablet Take 325 mg by mouth daily.     furosemide (LASIX)  20 MG tablet Take 20 mg by mouth daily.     HYDROcodone-acetaminophen (NORCO) 10-325 MG tablet Take 1 tablet by mouth 5 (five) times daily as needed for moderate pain.     metoprolol tartrate (LOPRESSOR) 25 MG tablet Take 25 mg by mouth 2 (two) times daily.      Multiple Vitamins-Minerals (PRESERVISION AREDS 2+MULTI VIT PO) Take 1 capsule by mouth in the morning and at bedtime.     nitrofurantoin (MACRODANTIN) 100 MG capsule Take 1 capsule (100 mg total) by mouth at bedtime. (Patient taking differently: Take 100 mg by mouth daily.)     OVER THE COUNTER MEDICATION Take 1 tablet by mouth daily. Bone up     OVER THE COUNTER MEDICATION Take 2 tablets by mouth daily as needed (constipation). Swiss Kriss     rosuvastatin (CRESTOR) 5 MG tablet Take 5 mg by mouth daily.     triamterene-hydrochlorothiazide (MAXZIDE-25) 37.5-25 MG tablet Take 1 tablet by mouth daily.     valsartan (DIOVAN) 320 MG tablet Take 320 mg by mouth daily.     venlafaxine XR (EFFEXOR-XR) 75 MG 24 hr capsule TAKE 1 CAPSULE BY MOUTH ONCE DAILY FOR 7 DAYS, THEN INCREASE TO  2 ONCE DAILY 60 capsule 0   zolpidem (AMBIEN) 10 MG tablet Take 10 mg by mouth at bedtime.     No current facility-administered medications for this visit.   Facility-Administered Medications Ordered in Other Visits  Medication Dose Route Frequency Provider Last Rate Last Admin   methocarbamol (ROBAXIN) tablet 500 mg  500 mg Oral Q6H PRN [name], MD       metroNIDAZOLE (FLAGYL) tablet 500 mg  500 mg Oral Q8H [name], MD       ondansetron (ZOFRAN-ODT) disintegrating tablet 4 mg  4 mg Oral Q6H PRN [name], MD       Or   ondansetron [facility]) 4 mg in sodium chloride 0.9 % 50 mL IVPB  4 mg Intravenous Q6H PRN [name], MD       oxyCODONE (Oxy IR/ROXICODONE) immediate release tablet 5-10 mg  5-10 mg Oral Q4H PRN [name], MD       simethicone [facility]) chewable tablet 40 mg  40 mg Oral Q6H PRN [name], MD         PHYSICAL EXAMINATION: ECOG PERFORMANCE STATUS: 1 - Symptomatic but completely ambulatory  Vitals:   06/20/23 1308  BP: (!) 183/81  Pulse: 78  Resp: 18  Temp: 97.8 F (36.6 C)  SpO2: 96%   Filed Weights   06/20/23 1308  Weight: 273 lb 9.6 oz (124.1 kg)      LABORATORY DATA:  I have reviewed the data as listed    Latest Ref Rng & Units 04/12/2023   12:53 PM 01/10/2023    6:20 AM 01/04/2023   11:49 AM  CMP  Glucose 70 - 99 mg/dL 161  096  045   BUN 8 - 23 mg/dL 25  22  14    Creatinine 0.44 - 1.00 mg/dL 4.09  8.11  9.14   Sodium 135 - 145 mmol/L 140  139  138   Potassium 3.5 - 5.1 mmol/L 3.2  3.5  3.7   Chloride 98 - 111 mmol/L  104  98  101   CO2 22 - 32 mmol/L 27  28  28    Calcium 8.9 - 10.3 mg/dL 9.9  57.8  9.6   Total Protein 6.5 - 8.1 g/dL 6.2   6.7   Total Bilirubin 0.3 - 1.2 mg/dL 0.3   0.4   Alkaline Phos 38 - 126 U/L 90   100   AST 15 - 41 U/L 17   29   ALT 0 - 44 U/L 15   25     Lab Results  Component Value Date   WBC 6.6 04/12/2023   HGB 13.0 04/12/2023   HCT 39.1 04/12/2023   MCV 89.1 04/12/2023   PLT 210 04/12/2023   NEUTROABS 4.7 04/12/2023    ASSESSMENT & PLAN:  Malignant neoplasm of upper-inner quadrant of left breast in female, estrogen receptor positive (HCC) 05/04/2016: Left breast UIQ T2 N0 grade 1 IDC ER/PR positive HER2 negative Ki-67 10% 05/09/2016: Biopsy second area UIQ left breast grade 2 IDC ER/PR positive HER2 negative Ki-67 15% 06/13/2016: Left lumpectomy: Grade 1 IDC T1c N0, bilateral reduction mammoplasty 08/02/2016: Left mastectomy: No evidence of malignancy November 2017: Oncotype DX score 18 (11% risk of distant recurrence) September 2017: Anastrozole x 5 years completed November 2022 2023: Cutaneous diastases chest wall excision margin positive: Metastatic breast cancer ER 95%, PR 80%, HER2 2+ by IHC FISH negative  Current treatment: Faslodex monthly Cutaneous metastasis: I recommend that we do a PET CT scan annually.  I  will set this up for January 2025.  Patient will come monthly for injection appointments and I will see her back in January after her scan. We discussed Signatera testing for monitoring her disease but she did not express any interest in that.  Arthritis Patient reports significant pain, currently managed with Norco. Discussed potential natural remedies. -Continue current pain management regimen. -Consider trying topical application of olive oil infused with lemon or orange peel for potential pain relief.  General Health Maintenance -Continue [facility]. -Follow-up appointment in four months, after PET scan.      Orders Placed This Encounter  Procedures   NM PET Image Restag (PS) Skull Base To Thigh    Standing Status:   Future    Standing Expiration Date:   06/19/2024    Order Specific Question:   If indicated for the ordered procedure, I authorize the administration of a radiopharmaceutical per Radiology protocol    Answer:   Yes    Order Specific Question:   Preferred imaging location?    Answer:   [facility]    Order Specific Question:   Release to patient    Answer:   Immediate   The patient has a good understanding of the overall plan. she agrees with it. she will call with any problems that may develop before the next visit here. Total time spent: 30 mins including face to face time and time spent for planning, charting and co-ordination of care   Tamsen Meek, MD 06/20/23

## 2023-06-27 DIAGNOSIS — H3554 Dystrophies primarily involving the retinal pigment epithelium: Secondary | ICD-10-CM | POA: Diagnosis not present

## 2023-06-27 DIAGNOSIS — H35722 Serous detachment of retinal pigment epithelium, left eye: Secondary | ICD-10-CM | POA: Diagnosis not present

## 2023-06-27 DIAGNOSIS — H353124 Nonexudative age-related macular degeneration, left eye, advanced atrophic with subfoveal involvement: Secondary | ICD-10-CM | POA: Diagnosis not present

## 2023-06-27 DIAGNOSIS — H353212 Exudative age-related macular degeneration, right eye, with inactive choroidal neovascularization: Secondary | ICD-10-CM | POA: Diagnosis not present

## 2023-06-27 DIAGNOSIS — Z9889 Other specified postprocedural states: Secondary | ICD-10-CM | POA: Diagnosis not present

## 2023-06-27 DIAGNOSIS — H353114 Nonexudative age-related macular degeneration, right eye, advanced atrophic with subfoveal involvement: Secondary | ICD-10-CM | POA: Diagnosis not present

## 2023-07-18 ENCOUNTER — Other Ambulatory Visit: Payer: Medicare Other

## 2023-07-18 ENCOUNTER — Inpatient Hospital Stay: Payer: Medicare Other

## 2023-07-18 ENCOUNTER — Ambulatory Visit: Payer: Medicare Other | Admitting: Hematology and Oncology

## 2023-07-18 VITALS — BP 186/79 | HR 68 | Temp 98.2°F | Resp 18

## 2023-07-18 DIAGNOSIS — C792 Secondary malignant neoplasm of skin: Secondary | ICD-10-CM | POA: Diagnosis not present

## 2023-07-18 DIAGNOSIS — M1990 Unspecified osteoarthritis, unspecified site: Secondary | ICD-10-CM | POA: Diagnosis not present

## 2023-07-18 DIAGNOSIS — Z9012 Acquired absence of left breast and nipple: Secondary | ICD-10-CM | POA: Diagnosis not present

## 2023-07-18 DIAGNOSIS — Z170 Estrogen receptor positive status [ER+]: Secondary | ICD-10-CM | POA: Diagnosis not present

## 2023-07-18 DIAGNOSIS — Z79811 Long term (current) use of aromatase inhibitors: Secondary | ICD-10-CM | POA: Diagnosis not present

## 2023-07-18 DIAGNOSIS — Z79899 Other long term (current) drug therapy: Secondary | ICD-10-CM | POA: Diagnosis not present

## 2023-07-18 DIAGNOSIS — C50212 Malignant neoplasm of upper-inner quadrant of left female breast: Secondary | ICD-10-CM | POA: Diagnosis not present

## 2023-07-18 DIAGNOSIS — Z7982 Long term (current) use of aspirin: Secondary | ICD-10-CM | POA: Diagnosis not present

## 2023-07-18 MED ORDER — FULVESTRANT 250 MG/5ML IM SOSY
500.0000 mg | PREFILLED_SYRINGE | Freq: Once | INTRAMUSCULAR | Status: AC
  Administered 2023-07-18: 500 mg via INTRAMUSCULAR
  Filled 2023-07-18: qty 10

## 2023-07-25 DIAGNOSIS — H353212 Exudative age-related macular degeneration, right eye, with inactive choroidal neovascularization: Secondary | ICD-10-CM | POA: Diagnosis not present

## 2023-07-25 DIAGNOSIS — H3554 Dystrophies primarily involving the retinal pigment epithelium: Secondary | ICD-10-CM | POA: Diagnosis not present

## 2023-07-25 DIAGNOSIS — H353114 Nonexudative age-related macular degeneration, right eye, advanced atrophic with subfoveal involvement: Secondary | ICD-10-CM | POA: Diagnosis not present

## 2023-07-25 DIAGNOSIS — H35722 Serous detachment of retinal pigment epithelium, left eye: Secondary | ICD-10-CM | POA: Diagnosis not present

## 2023-07-25 DIAGNOSIS — Z9889 Other specified postprocedural states: Secondary | ICD-10-CM | POA: Diagnosis not present

## 2023-07-25 DIAGNOSIS — H353124 Nonexudative age-related macular degeneration, left eye, advanced atrophic with subfoveal involvement: Secondary | ICD-10-CM | POA: Diagnosis not present

## 2023-08-06 DIAGNOSIS — E7800 Pure hypercholesterolemia, unspecified: Secondary | ICD-10-CM | POA: Diagnosis not present

## 2023-08-06 DIAGNOSIS — C799 Secondary malignant neoplasm of unspecified site: Secondary | ICD-10-CM | POA: Diagnosis not present

## 2023-08-06 DIAGNOSIS — E119 Type 2 diabetes mellitus without complications: Secondary | ICD-10-CM | POA: Diagnosis not present

## 2023-08-06 DIAGNOSIS — C50919 Malignant neoplasm of unspecified site of unspecified female breast: Secondary | ICD-10-CM | POA: Diagnosis not present

## 2023-08-08 DIAGNOSIS — H353124 Nonexudative age-related macular degeneration, left eye, advanced atrophic with subfoveal involvement: Secondary | ICD-10-CM | POA: Diagnosis not present

## 2023-08-08 DIAGNOSIS — H3554 Dystrophies primarily involving the retinal pigment epithelium: Secondary | ICD-10-CM | POA: Diagnosis not present

## 2023-08-08 DIAGNOSIS — H353212 Exudative age-related macular degeneration, right eye, with inactive choroidal neovascularization: Secondary | ICD-10-CM | POA: Diagnosis not present

## 2023-08-08 DIAGNOSIS — H353114 Nonexudative age-related macular degeneration, right eye, advanced atrophic with subfoveal involvement: Secondary | ICD-10-CM | POA: Diagnosis not present

## 2023-08-08 DIAGNOSIS — H35722 Serous detachment of retinal pigment epithelium, left eye: Secondary | ICD-10-CM | POA: Diagnosis not present

## 2023-08-08 DIAGNOSIS — Z9889 Other specified postprocedural states: Secondary | ICD-10-CM | POA: Diagnosis not present

## 2023-08-12 ENCOUNTER — Other Ambulatory Visit: Payer: Medicare Other

## 2023-08-12 ENCOUNTER — Inpatient Hospital Stay: Payer: Medicare Other | Attending: Hematology and Oncology

## 2023-08-12 VITALS — BP 173/82 | HR 66 | Temp 98.2°F | Resp 18

## 2023-08-12 DIAGNOSIS — Z170 Estrogen receptor positive status [ER+]: Secondary | ICD-10-CM | POA: Diagnosis not present

## 2023-08-12 DIAGNOSIS — C50212 Malignant neoplasm of upper-inner quadrant of left female breast: Secondary | ICD-10-CM | POA: Insufficient documentation

## 2023-08-12 DIAGNOSIS — Z5111 Encounter for antineoplastic chemotherapy: Secondary | ICD-10-CM | POA: Insufficient documentation

## 2023-08-12 MED ORDER — FULVESTRANT 250 MG/5ML IM SOSY
500.0000 mg | PREFILLED_SYRINGE | Freq: Once | INTRAMUSCULAR | Status: AC
  Administered 2023-08-12: 500 mg via INTRAMUSCULAR
  Filled 2023-08-12: qty 10

## 2023-09-05 DIAGNOSIS — H353114 Nonexudative age-related macular degeneration, right eye, advanced atrophic with subfoveal involvement: Secondary | ICD-10-CM | POA: Diagnosis not present

## 2023-09-05 DIAGNOSIS — H3554 Dystrophies primarily involving the retinal pigment epithelium: Secondary | ICD-10-CM | POA: Diagnosis not present

## 2023-09-05 DIAGNOSIS — Z9889 Other specified postprocedural states: Secondary | ICD-10-CM | POA: Diagnosis not present

## 2023-09-05 DIAGNOSIS — H353124 Nonexudative age-related macular degeneration, left eye, advanced atrophic with subfoveal involvement: Secondary | ICD-10-CM | POA: Diagnosis not present

## 2023-09-05 DIAGNOSIS — H353212 Exudative age-related macular degeneration, right eye, with inactive choroidal neovascularization: Secondary | ICD-10-CM | POA: Diagnosis not present

## 2023-09-05 DIAGNOSIS — H35722 Serous detachment of retinal pigment epithelium, left eye: Secondary | ICD-10-CM | POA: Diagnosis not present

## 2023-09-09 ENCOUNTER — Inpatient Hospital Stay: Payer: Medicare Other | Attending: Hematology and Oncology

## 2023-09-09 ENCOUNTER — Other Ambulatory Visit: Payer: Medicare Other

## 2023-09-09 VITALS — BP 160/78 | HR 73 | Temp 97.7°F | Resp 18

## 2023-09-09 DIAGNOSIS — C50212 Malignant neoplasm of upper-inner quadrant of left female breast: Secondary | ICD-10-CM | POA: Insufficient documentation

## 2023-09-09 DIAGNOSIS — Z79818 Long term (current) use of other agents affecting estrogen receptors and estrogen levels: Secondary | ICD-10-CM | POA: Diagnosis not present

## 2023-09-09 DIAGNOSIS — Z170 Estrogen receptor positive status [ER+]: Secondary | ICD-10-CM | POA: Insufficient documentation

## 2023-09-09 MED ORDER — FULVESTRANT 250 MG/5ML IM SOSY
500.0000 mg | PREFILLED_SYRINGE | Freq: Once | INTRAMUSCULAR | Status: AC
  Administered 2023-09-09: 500 mg via INTRAMUSCULAR
  Filled 2023-09-09: qty 10

## 2023-09-27 ENCOUNTER — Other Ambulatory Visit: Payer: Self-pay | Admitting: *Deleted

## 2023-09-27 DIAGNOSIS — C50212 Malignant neoplasm of upper-inner quadrant of left female breast: Secondary | ICD-10-CM

## 2023-10-01 ENCOUNTER — Ambulatory Visit: Payer: Medicare Other

## 2023-10-01 ENCOUNTER — Ambulatory Visit
Admission: RE | Admit: 2023-10-01 | Discharge: 2023-10-01 | Disposition: A | Discharge status: Home / Self Care | Payer: Medicare Other | Source: Ambulatory Visit | Attending: Hematology and Oncology

## 2023-10-01 ENCOUNTER — Other Ambulatory Visit: Payer: Self-pay | Admitting: Hematology and Oncology

## 2023-10-01 DIAGNOSIS — C50212 Malignant neoplasm of upper-inner quadrant of left female breast: Secondary | ICD-10-CM

## 2023-10-01 DIAGNOSIS — Z1231 Encounter for screening mammogram for malignant neoplasm of breast: Secondary | ICD-10-CM | POA: Diagnosis not present

## 2023-10-03 ENCOUNTER — Encounter (HOSPITAL_COMMUNITY)
Admission: RE | Admit: 2023-10-03 | Discharge: 2023-10-03 | Disposition: A | Discharge status: Home / Self Care | Payer: Medicare Other | Source: Ambulatory Visit | Attending: Hematology and Oncology | Admitting: Hematology and Oncology

## 2023-10-03 DIAGNOSIS — C799 Secondary malignant neoplasm of unspecified site: Secondary | ICD-10-CM | POA: Diagnosis not present

## 2023-10-03 DIAGNOSIS — C50919 Malignant neoplasm of unspecified site of unspecified female breast: Secondary | ICD-10-CM | POA: Diagnosis not present

## 2023-10-03 DIAGNOSIS — C50912 Malignant neoplasm of unspecified site of left female breast: Secondary | ICD-10-CM | POA: Diagnosis not present

## 2023-10-03 DIAGNOSIS — C50212 Malignant neoplasm of upper-inner quadrant of left female breast: Secondary | ICD-10-CM | POA: Insufficient documentation

## 2023-10-03 DIAGNOSIS — Z170 Estrogen receptor positive status [ER+]: Secondary | ICD-10-CM | POA: Diagnosis not present

## 2023-10-03 LAB — GLUCOSE, CAPILLARY: Glucose-Capillary: 166 mg/dL — ABNORMAL HIGH (ref 70–99)

## 2023-10-03 MED ORDER — FLUDEOXYGLUCOSE F - 18 (FDG) INJECTION
13.6000 | Freq: Once | INTRAVENOUS | Status: AC
  Administered 2023-10-03: 14.1 via INTRAVENOUS

## 2023-10-10 ENCOUNTER — Inpatient Hospital Stay: Payer: Medicare Other | Attending: Hematology and Oncology

## 2023-10-10 VITALS — BP 177/72 | HR 66 | Temp 97.8°F | Resp 20

## 2023-10-10 DIAGNOSIS — Z5111 Encounter for antineoplastic chemotherapy: Secondary | ICD-10-CM | POA: Diagnosis not present

## 2023-10-10 DIAGNOSIS — Z1732 Human epidermal growth factor receptor 2 negative status: Secondary | ICD-10-CM | POA: Diagnosis not present

## 2023-10-10 DIAGNOSIS — Z170 Estrogen receptor positive status [ER+]: Secondary | ICD-10-CM | POA: Insufficient documentation

## 2023-10-10 DIAGNOSIS — Z1721 Progesterone receptor positive status: Secondary | ICD-10-CM | POA: Diagnosis not present

## 2023-10-10 DIAGNOSIS — C50212 Malignant neoplasm of upper-inner quadrant of left female breast: Secondary | ICD-10-CM | POA: Insufficient documentation

## 2023-10-10 MED ORDER — FULVESTRANT 250 MG/5ML IM SOSY
500.0000 mg | PREFILLED_SYRINGE | Freq: Once | INTRAMUSCULAR | Status: AC
  Administered 2023-10-10: 500 mg via INTRAMUSCULAR
  Filled 2023-10-10: qty 10

## 2023-10-14 ENCOUNTER — Telehealth: Payer: Self-pay | Admitting: Hematology and Oncology

## 2023-10-14 NOTE — Telephone Encounter (Signed)
PET-CT scan: No evidence of malignancy Patient was super happy to hear this result.

## 2023-10-18 ENCOUNTER — Telehealth: Payer: Self-pay | Admitting: *Deleted

## 2023-10-18 NOTE — Telephone Encounter (Signed)
Received VM from pt.  RN attempt x1 to return call.  No answer, LVM for pt to return call to the office.  °

## 2023-10-24 DIAGNOSIS — G894 Chronic pain syndrome: Secondary | ICD-10-CM | POA: Diagnosis not present

## 2023-10-24 DIAGNOSIS — M5459 Other low back pain: Secondary | ICD-10-CM | POA: Diagnosis not present

## 2023-10-24 DIAGNOSIS — T402X5A Adverse effect of other opioids, initial encounter: Secondary | ICD-10-CM | POA: Diagnosis not present

## 2023-10-28 DIAGNOSIS — E119 Type 2 diabetes mellitus without complications: Secondary | ICD-10-CM | POA: Diagnosis not present

## 2023-11-13 ENCOUNTER — Other Ambulatory Visit: Payer: Self-pay | Admitting: *Deleted

## 2023-11-13 DIAGNOSIS — C50212 Malignant neoplasm of upper-inner quadrant of left female breast: Secondary | ICD-10-CM

## 2023-11-14 ENCOUNTER — Inpatient Hospital Stay: Payer: Medicare Other | Attending: Hematology and Oncology

## 2023-11-14 ENCOUNTER — Inpatient Hospital Stay: Payer: Medicare Other

## 2023-11-14 ENCOUNTER — Inpatient Hospital Stay: Payer: Medicare Other | Admitting: Hematology and Oncology

## 2023-11-14 VITALS — BP 161/89 | HR 76 | Temp 98.2°F | Resp 19 | Ht 60.0 in

## 2023-11-14 DIAGNOSIS — Z1732 Human epidermal growth factor receptor 2 negative status: Secondary | ICD-10-CM | POA: Diagnosis not present

## 2023-11-14 DIAGNOSIS — Z79811 Long term (current) use of aromatase inhibitors: Secondary | ICD-10-CM | POA: Diagnosis not present

## 2023-11-14 DIAGNOSIS — C792 Secondary malignant neoplasm of skin: Secondary | ICD-10-CM | POA: Diagnosis not present

## 2023-11-14 DIAGNOSIS — Z9012 Acquired absence of left breast and nipple: Secondary | ICD-10-CM | POA: Insufficient documentation

## 2023-11-14 DIAGNOSIS — Z170 Estrogen receptor positive status [ER+]: Secondary | ICD-10-CM

## 2023-11-14 DIAGNOSIS — C50212 Malignant neoplasm of upper-inner quadrant of left female breast: Secondary | ICD-10-CM | POA: Diagnosis not present

## 2023-11-14 DIAGNOSIS — Z5111 Encounter for antineoplastic chemotherapy: Secondary | ICD-10-CM | POA: Diagnosis not present

## 2023-11-14 DIAGNOSIS — Z1721 Progesterone receptor positive status: Secondary | ICD-10-CM | POA: Insufficient documentation

## 2023-11-14 LAB — CMP (CANCER CENTER ONLY)
ALT: 14 U/L (ref 0–44)
AST: 15 U/L (ref 15–41)
Albumin: 4.2 g/dL (ref 3.5–5.0)
Alkaline Phosphatase: 79 U/L (ref 38–126)
Anion gap: 6 (ref 5–15)
BUN: 26 mg/dL — ABNORMAL HIGH (ref 8–23)
CO2: 31 mmol/L (ref 22–32)
Calcium: 10.2 mg/dL (ref 8.9–10.3)
Chloride: 101 mmol/L (ref 98–111)
Creatinine: 0.98 mg/dL (ref 0.44–1.00)
GFR, Estimated: 58 mL/min — ABNORMAL LOW (ref >60)
Glucose, Bld: 129 mg/dL — ABNORMAL HIGH (ref 70–99)
Potassium: 4.2 mmol/L (ref 3.5–5.1)
Sodium: 138 mmol/L (ref 135–145)
Total Bilirubin: 0.4 mg/dL (ref 0.0–1.2)
Total Protein: 7 g/dL (ref 6.5–8.1)

## 2023-11-14 LAB — CBC WITH DIFFERENTIAL (CANCER CENTER ONLY)
Abs Immature Granulocytes: 0.04 10*3/uL (ref 0.00–0.07)
Basophils Absolute: 0 10*3/uL (ref 0.0–0.1)
Basophils Relative: 0 %
Eosinophils Absolute: 0.2 10*3/uL (ref 0.0–0.5)
Eosinophils Relative: 3 %
HCT: 43.7 % (ref 36.0–46.0)
Hemoglobin: 14.2 g/dL (ref 12.0–15.0)
Immature Granulocytes: 1 %
Lymphocytes Relative: 18 %
Lymphs Abs: 1.3 10*3/uL (ref 0.7–4.0)
MCH: 28.7 pg (ref 26.0–34.0)
MCHC: 32.5 g/dL (ref 30.0–36.0)
MCV: 88.3 fL (ref 80.0–100.0)
Monocytes Absolute: 0.7 10*3/uL (ref 0.1–1.0)
Monocytes Relative: 9 %
Neutro Abs: 4.8 10*3/uL (ref 1.7–7.7)
Neutrophils Relative %: 69 %
Platelet Count: 249 10*3/uL (ref 150–400)
RBC: 4.95 MIL/uL (ref 3.87–5.11)
RDW: 13.1 % (ref 11.5–15.5)
WBC Count: 7.1 10*3/uL (ref 4.0–10.5)
nRBC: 0 % (ref 0.0–0.2)

## 2023-11-14 MED ORDER — FULVESTRANT 250 MG/5ML IM SOSY
500.0000 mg | PREFILLED_SYRINGE | Freq: Once | INTRAMUSCULAR | Status: AC
  Administered 2023-11-14: 500 mg via INTRAMUSCULAR
  Filled 2023-11-14: qty 10

## 2023-11-14 NOTE — Progress Notes (Signed)
 Patient Care Team: Sigmund Hazel, MD as PCP - General (Family Medicine) [name], MD as Consulting Physician (General Surgery) Glenna Fellows, MD as Consulting Physician (Plastic Surgery) Ollen Gross, MD as Consulting Physician (Orthopedic Surgery) Pollyann Savoy, MD as Consulting Physician (Rheumatology) Axel Filler Larna Daughters, NP as Nurse Practitioner (Hematology and Oncology) Alfredo Martinez, MD as Consulting Physician (Urology) Rachel Moulds, MD as Consulting Physician (Hematology and Oncology)  DIAGNOSIS:  Encounter Diagnosis  Name Primary?   Malignant neoplasm of upper-inner quadrant of left breast in female, estrogen receptor positive (HCC) Yes    SUMMARY OF ONCOLOGIC HISTORY: Oncology History  Malignant neoplasm of upper-inner quadrant of left breast in female, estrogen receptor positive (HCC)  05/04/2016 Initial Biopsy   Left breast biopsy: IDC, grade 1, ER+(90%), PR+(90%), Ki-67 10%, HER-2 negative (ratio 1.19).   05/09/2016 Initial Biopsy   Left breast second area of concern: ILC, grade 2, ER+(100%), PR+(90%), Ki-67 15%, HER-2 negative (ratio 1.50).   05/31/2016 Initial Diagnosis   Malignant neoplasm of upper-inner quadrant of left breast in female, estrogen receptor positive (HCC)   06/13/2016 Surgery   Left breast lumpectomy: IDC grade 1, two foci, 1.7cm and 1.5 cm, margins negative, lobular neoplasia, 1 SLN with isolated tumor cells, 1 SLN negative.   T1c, N0 (i+)   06/13/2016 Oncotype testing   18/11%, intermediate risk   05/2016 -  Anti-estrogen oral therapy   Anastrozole daily   06/13/2016 Cancer Staging   Staging form: Breast, AJCC 7th Edition - Pathologic stage from 06/13/2016: Stage IA (T1c, N0(i+), cM0) - Signed by Lonie Peak, MD on 01/19/2023 Stage prefix: Initial diagnosis Histologic grade (G): G1 Estrogen receptor status: Positive Progesterone receptor status: Positive HER2 status: Negative   06/25/2016 Surgery   Bilateral  reduction  mammoplasty   07/11/2016 Surgery   Debridement of left nipple necrosis   08/02/2016 Surgery   Left simple mastectomy showing inflammation and abscess but no malignancy   01/18/2023 Cancer Staging   Staging form: Breast, AJCC 7th Edition - Pathologic stage from 01/18/2023: Stage Unknown (rT4b, NX, cM0) - Signed by Lonie Peak, MD on 01/19/2023 Stage prefix: Recurrence Laterality: Left     CHIEF COMPLIANT: Follow-up on Faslodex  HISTORY OF PRESENT ILLNESS: Debra Griffith is an 83 year old female with breast cancer who presents for follow-up after a clear PET scan.  She has been receiving regular injections as part of her ongoing treatment for breast cancer. The injections do not cause significant discomfort, although she sometimes experiences mild hot flashes. Warming the injection helps reduce discomfort. She has been on her current medication regimen for about a year, having switched from a previous treatment she was on for five years. She is satisfied with the results of her current treatment.  She reports significant arthritis, particularly affecting her hands, which has worsened over the past few weeks. She describes difficulty with gripping and writing, noting that her fingers become red and stiff, especially in the morning. Warm water helps alleviate some of the stiffness. She has a history of arthritis throughout her body.   ALLERGIES:  is allergic to morphine and codeine and other.  MEDICATIONS:  Current Outpatient Medications  Medication Sig Dispense Refill   aspirin EC 81 MG tablet Take 81 mg by mouth daily. Swallow whole.     Calcium Carbonate Antacid (TUMS PO) Take 1 tablet by mouth as needed (heartburn).     Cholecalciferol (VITAMIN D) 125 MCG (5000 UT) CAPS Take 5,000 Units by mouth daily.  clonazePAM (KLONOPIN) 1 MG tablet Take 1 tablet (1 mg total) by mouth daily. 30 tablet 0   cyclobenzaprine (FLEXERIL) 10 MG tablet Take 20 mg by mouth at bedtime.      ferrous sulfate 325 (65 FE) MG EC tablet Take 325 mg by mouth daily.     furosemide (LASIX) 20 MG tablet Take 20 mg by mouth daily.     HYDROcodone-acetaminophen (NORCO) 10-325 MG tablet Take 1 tablet by mouth 5 (five) times daily as needed for moderate pain.     metoprolol tartrate (LOPRESSOR) 25 MG tablet Take 25 mg by mouth 2 (two) times daily.      Multiple Vitamins-Minerals (PRESERVISION AREDS 2+MULTI VIT PO) Take 1 capsule by mouth in the morning and at bedtime.     nitrofurantoin (MACRODANTIN) 100 MG capsule Take 1 capsule (100 mg total) by mouth at bedtime. (Patient taking differently: Take 100 mg by mouth daily.)     OVER THE COUNTER MEDICATION Take 1 tablet by mouth daily. Bone up     OVER THE COUNTER MEDICATION Take 2 tablets by mouth daily as needed (constipation). Swiss Kriss     rosuvastatin (CRESTOR) 5 MG tablet Take 5 mg by mouth daily.     triamterene-hydrochlorothiazide (MAXZIDE-25) 37.5-25 MG tablet Take 1 tablet by mouth daily.     valsartan (DIOVAN) 320 MG tablet Take 320 mg by mouth daily.     venlafaxine XR (EFFEXOR-XR) 75 MG 24 hr capsule TAKE 1 CAPSULE BY MOUTH ONCE DAILY FOR 7 DAYS, THEN INCREASE TO  2 ONCE DAILY 60 capsule 0   zolpidem (AMBIEN) 10 MG tablet Take 10 mg by mouth at bedtime.     No current facility-administered medications for this visit.   Facility-Administered Medications Ordered in Other Visits  Medication Dose Route Frequency Provider Last Rate Last Admin   fulvestrant (FASLODEX) injection 500 mg  500 mg Intramuscular Once Debra Croissant, MD       methocarbamol (ROBAXIN) tablet 500 mg  500 mg Oral Q6H PRN [name], MD       metroNIDAZOLE (FLAGYL) tablet 500 mg  500 mg Oral Q8H [name], MD       ondansetron (ZOFRAN-ODT) disintegrating tablet 4 mg  4 mg Oral Q6H PRN [name], MD       Or   ondansetron [facility]) 4 mg in sodium chloride 0.9 % 50 mL IVPB  4 mg Intravenous Q6H PRN [name], MD       oxyCODONE (Oxy  IR/ROXICODONE) immediate release tablet 5-10 mg  5-10 mg Oral Q4H PRN [name], MD       simethicone [facility]) chewable tablet 40 mg  40 mg Oral Q6H PRN [name], MD        PHYSICAL EXAMINATION: ECOG PERFORMANCE STATUS: 1 - Symptomatic but completely ambulatory  Vitals:   11/14/23 1440  BP: (!) 161/89  Pulse: 76  Resp: 19  Temp: 98.2 F (36.8 C)  SpO2: 98%   Filed Weights      LABORATORY DATA:  I have reviewed the data as listed    Latest Ref Rng & Units 11/14/2023    2:16 PM 04/12/2023   12:53 PM 01/10/2023    6:20 AM  CMP  Glucose 70 - 99 mg/dL 409  811  914   BUN 8 - 23 mg/dL 26  25  22    Creatinine 0.44 - 1.00 mg/dL 7.82  9.56  2.13   Sodium 135 - 145 mmol/L 138  140  139  Potassium 3.5 - 5.1 mmol/L 4.2  3.2  3.5   Chloride 98 - 111 mmol/L 101  104  98   CO2 22 - 32 mmol/L 31  27  28    Calcium 8.9 - 10.3 mg/dL 81.1  9.9  91.4   Total Protein 6.5 - 8.1 g/dL 7.0  6.2    Total Bilirubin 0.0 - 1.2 mg/dL 0.4  0.3    Alkaline Phos 38 - 126 U/L 79  90    AST 15 - 41 U/L 15  17    ALT 0 - 44 U/L 14  15      Lab Results  Component Value Date   WBC 7.1 11/14/2023   HGB 14.2 11/14/2023   HCT 43.7 11/14/2023   MCV 88.3 11/14/2023   PLT 249 11/14/2023   NEUTROABS 4.8 11/14/2023    ASSESSMENT & PLAN:  Malignant neoplasm of upper-inner quadrant of left breast in female, estrogen receptor positive (HCC) 05/04/2016: Left breast UIQ T2 N0 grade 1 IDC ER/PR positive HER2 negative Ki-67 10% 05/09/2016: Biopsy second area UIQ left breast grade 2 IDC ER/PR positive HER2 negative Ki-67 15% 06/13/2016: Left lumpectomy: Grade 1 IDC T1c N0, bilateral reduction mammoplasty 08/02/2016: Left mastectomy: No evidence of malignancy November 2017: Oncotype DX score 18 (11% risk of distant recurrence) September 2017: Anastrozole x 5 years completed November 2022 2023: Cutaneous diastases chest wall excision margin positive: Metastatic breast cancer ER 95%, PR 80%, HER2  2+ by IHC FISH negative 10/11/2023: PET/CT: No evidence of metastatic disease.  Fatty liver, gallstones   Current treatment: Faslodex monthly Cutaneous metastasis: I recommend that we do a PET CT scan annually.     Patient will come monthly for injection appointments and I will see her back in January after her scan. Return to clinic monthly for injections and every 6 months of follow-up with me.     No orders of the defined types were placed in this encounter.  The patient has a good understanding of the overall plan. she agrees with it. she will call with any problems that may develop before the next visit here. Total time spent: 30 mins including face to face time and time spent for planning, charting and co-ordination of care   Tamsen Meek, MD 11/14/23

## 2023-11-14 NOTE — Assessment & Plan Note (Signed)
 05/04/2016: Left breast UIQ T2 N0 grade 1 IDC ER/PR positive HER2 negative Ki-67 10% 05/09/2016: Biopsy second area UIQ left breast grade 2 IDC ER/PR positive HER2 negative Ki-67 15% 06/13/2016: Left lumpectomy: Grade 1 IDC T1c N0, bilateral reduction mammoplasty 08/02/2016: Left mastectomy: No evidence of malignancy November 2017: Oncotype DX score 18 (11% risk of distant recurrence) September 2017: Anastrozole x 5 years completed November 2022 2023: Cutaneous diastases chest wall excision margin positive: Metastatic breast cancer ER 95%, PR 80%, HER2 2+ by IHC FISH negative 10/11/2023: PET/CT: No evidence of metastatic disease.  Fatty liver, gallstones   Current treatment: Faslodex monthly Cutaneous metastasis: I recommend that we do a PET CT scan annually.  I will set this up for January 2026.   Patient will come monthly for injection appointments and I will see her back in January after her scan. We discussed Signatera testing for monitoring her disease but she did not express any interest in that.

## 2023-12-03 DIAGNOSIS — D2261 Melanocytic nevi of right upper limb, including shoulder: Secondary | ICD-10-CM | POA: Diagnosis not present

## 2023-12-03 DIAGNOSIS — D3614 Benign neoplasm of peripheral nerves and autonomic nervous system of thorax: Secondary | ICD-10-CM | POA: Diagnosis not present

## 2023-12-03 DIAGNOSIS — D224 Melanocytic nevi of scalp and neck: Secondary | ICD-10-CM | POA: Diagnosis not present

## 2023-12-03 DIAGNOSIS — D2262 Melanocytic nevi of left upper limb, including shoulder: Secondary | ICD-10-CM | POA: Diagnosis not present

## 2023-12-03 DIAGNOSIS — L821 Other seborrheic keratosis: Secondary | ICD-10-CM | POA: Diagnosis not present

## 2023-12-04 DIAGNOSIS — Z79899 Other long term (current) drug therapy: Secondary | ICD-10-CM | POA: Diagnosis not present

## 2023-12-04 DIAGNOSIS — I10 Essential (primary) hypertension: Secondary | ICD-10-CM | POA: Diagnosis not present

## 2023-12-04 DIAGNOSIS — E119 Type 2 diabetes mellitus without complications: Secondary | ICD-10-CM | POA: Diagnosis not present

## 2023-12-09 DIAGNOSIS — H35722 Serous detachment of retinal pigment epithelium, left eye: Secondary | ICD-10-CM | POA: Diagnosis not present

## 2023-12-09 DIAGNOSIS — H353134 Nonexudative age-related macular degeneration, bilateral, advanced atrophic with subfoveal involvement: Secondary | ICD-10-CM | POA: Diagnosis not present

## 2023-12-09 DIAGNOSIS — H3554 Dystrophies primarily involving the retinal pigment epithelium: Secondary | ICD-10-CM | POA: Diagnosis not present

## 2023-12-09 DIAGNOSIS — H353212 Exudative age-related macular degeneration, right eye, with inactive choroidal neovascularization: Secondary | ICD-10-CM | POA: Diagnosis not present

## 2023-12-09 DIAGNOSIS — Z9889 Other specified postprocedural states: Secondary | ICD-10-CM | POA: Diagnosis not present

## 2023-12-12 ENCOUNTER — Inpatient Hospital Stay: Payer: Medicare Other | Attending: Hematology and Oncology

## 2023-12-12 VITALS — BP 156/94 | HR 91 | Temp 98.1°F | Resp 20

## 2023-12-12 DIAGNOSIS — Z170 Estrogen receptor positive status [ER+]: Secondary | ICD-10-CM | POA: Insufficient documentation

## 2023-12-12 DIAGNOSIS — C50212 Malignant neoplasm of upper-inner quadrant of left female breast: Secondary | ICD-10-CM | POA: Diagnosis not present

## 2023-12-12 DIAGNOSIS — Z79818 Long term (current) use of other agents affecting estrogen receptors and estrogen levels: Secondary | ICD-10-CM | POA: Diagnosis not present

## 2023-12-12 MED ORDER — FULVESTRANT 250 MG/5ML IM SOSY
500.0000 mg | PREFILLED_SYRINGE | Freq: Once | INTRAMUSCULAR | Status: AC
Start: 2023-12-12 — End: 2023-12-12
  Administered 2023-12-12: 500 mg via INTRAMUSCULAR
  Filled 2023-12-12: qty 10

## 2023-12-16 DIAGNOSIS — C799 Secondary malignant neoplasm of unspecified site: Secondary | ICD-10-CM | POA: Diagnosis not present

## 2023-12-16 DIAGNOSIS — E7800 Pure hypercholesterolemia, unspecified: Secondary | ICD-10-CM | POA: Diagnosis not present

## 2023-12-16 DIAGNOSIS — R202 Paresthesia of skin: Secondary | ICD-10-CM | POA: Diagnosis not present

## 2023-12-16 DIAGNOSIS — I10 Essential (primary) hypertension: Secondary | ICD-10-CM | POA: Diagnosis not present

## 2023-12-16 DIAGNOSIS — N390 Urinary tract infection, site not specified: Secondary | ICD-10-CM | POA: Diagnosis not present

## 2023-12-16 DIAGNOSIS — C50919 Malignant neoplasm of unspecified site of unspecified female breast: Secondary | ICD-10-CM | POA: Diagnosis not present

## 2023-12-16 DIAGNOSIS — E119 Type 2 diabetes mellitus without complications: Secondary | ICD-10-CM | POA: Diagnosis not present

## 2023-12-26 DIAGNOSIS — N289 Disorder of kidney and ureter, unspecified: Secondary | ICD-10-CM | POA: Diagnosis not present

## 2024-01-07 DIAGNOSIS — Z170 Estrogen receptor positive status [ER+]: Secondary | ICD-10-CM | POA: Diagnosis not present

## 2024-01-07 DIAGNOSIS — C50112 Malignant neoplasm of central portion of left female breast: Secondary | ICD-10-CM | POA: Diagnosis not present

## 2024-01-09 ENCOUNTER — Inpatient Hospital Stay: Payer: Medicare Other

## 2024-01-10 ENCOUNTER — Inpatient Hospital Stay: Payer: Medicare Other | Attending: Hematology and Oncology

## 2024-01-10 VITALS — BP 157/74 | HR 74 | Temp 98.0°F | Resp 17

## 2024-01-10 DIAGNOSIS — Z170 Estrogen receptor positive status [ER+]: Secondary | ICD-10-CM

## 2024-01-10 DIAGNOSIS — Z79818 Long term (current) use of other agents affecting estrogen receptors and estrogen levels: Secondary | ICD-10-CM | POA: Diagnosis not present

## 2024-01-10 DIAGNOSIS — C50212 Malignant neoplasm of upper-inner quadrant of left female breast: Secondary | ICD-10-CM | POA: Diagnosis not present

## 2024-01-10 MED ORDER — FULVESTRANT 250 MG/5ML IM SOSY
500.0000 mg | PREFILLED_SYRINGE | Freq: Once | INTRAMUSCULAR | Status: AC
  Administered 2024-01-10: 500 mg via INTRAMUSCULAR
  Filled 2024-01-10: qty 10

## 2024-02-06 ENCOUNTER — Encounter: Payer: Self-pay | Admitting: Hematology and Oncology

## 2024-02-06 ENCOUNTER — Inpatient Hospital Stay: Payer: Medicare Other | Attending: Hematology and Oncology

## 2024-02-06 VITALS — BP 140/73 | HR 71 | Temp 98.1°F | Resp 18

## 2024-02-06 DIAGNOSIS — Z79818 Long term (current) use of other agents affecting estrogen receptors and estrogen levels: Secondary | ICD-10-CM | POA: Diagnosis not present

## 2024-02-06 DIAGNOSIS — C50212 Malignant neoplasm of upper-inner quadrant of left female breast: Secondary | ICD-10-CM | POA: Diagnosis not present

## 2024-02-06 DIAGNOSIS — Z170 Estrogen receptor positive status [ER+]: Secondary | ICD-10-CM | POA: Insufficient documentation

## 2024-02-06 MED ORDER — FULVESTRANT 250 MG/5ML IM SOSY
500.0000 mg | PREFILLED_SYRINGE | Freq: Once | INTRAMUSCULAR | Status: AC
Start: 2024-02-06 — End: 2024-02-06
  Administered 2024-02-06: 500 mg via INTRAMUSCULAR
  Filled 2024-02-06: qty 10

## 2024-02-20 DIAGNOSIS — Z79899 Other long term (current) drug therapy: Secondary | ICD-10-CM | POA: Diagnosis not present

## 2024-02-20 DIAGNOSIS — M5459 Other low back pain: Secondary | ICD-10-CM | POA: Diagnosis not present

## 2024-02-20 DIAGNOSIS — G894 Chronic pain syndrome: Secondary | ICD-10-CM | POA: Diagnosis not present

## 2024-02-20 DIAGNOSIS — T402X5A Adverse effect of other opioids, initial encounter: Secondary | ICD-10-CM | POA: Diagnosis not present

## 2024-02-27 DIAGNOSIS — N183 Chronic kidney disease, stage 3 unspecified: Secondary | ICD-10-CM | POA: Diagnosis not present

## 2024-03-05 ENCOUNTER — Inpatient Hospital Stay: Payer: Medicare Other | Attending: Hematology and Oncology

## 2024-03-05 VITALS — BP 154/90 | HR 82 | Temp 98.6°F | Resp 18

## 2024-03-05 DIAGNOSIS — Z170 Estrogen receptor positive status [ER+]: Secondary | ICD-10-CM | POA: Insufficient documentation

## 2024-03-05 DIAGNOSIS — Z1732 Human epidermal growth factor receptor 2 negative status: Secondary | ICD-10-CM | POA: Diagnosis not present

## 2024-03-05 DIAGNOSIS — C50212 Malignant neoplasm of upper-inner quadrant of left female breast: Secondary | ICD-10-CM | POA: Insufficient documentation

## 2024-03-05 DIAGNOSIS — Z1721 Progesterone receptor positive status: Secondary | ICD-10-CM | POA: Insufficient documentation

## 2024-03-05 MED ORDER — FULVESTRANT 250 MG/5ML IM SOSY
500.0000 mg | PREFILLED_SYRINGE | Freq: Once | INTRAMUSCULAR | Status: AC
  Administered 2024-03-05: 500 mg via INTRAMUSCULAR

## 2024-04-02 ENCOUNTER — Inpatient Hospital Stay: Payer: Medicare Other | Attending: Hematology and Oncology

## 2024-04-02 VITALS — BP 161/69 | HR 59 | Resp 18

## 2024-04-02 DIAGNOSIS — C50212 Malignant neoplasm of upper-inner quadrant of left female breast: Secondary | ICD-10-CM | POA: Insufficient documentation

## 2024-04-02 DIAGNOSIS — Z79818 Long term (current) use of other agents affecting estrogen receptors and estrogen levels: Secondary | ICD-10-CM | POA: Diagnosis not present

## 2024-04-02 DIAGNOSIS — Z170 Estrogen receptor positive status [ER+]: Secondary | ICD-10-CM | POA: Insufficient documentation

## 2024-04-02 MED ORDER — FULVESTRANT 250 MG/5ML IM SOSY
500.0000 mg | PREFILLED_SYRINGE | Freq: Once | INTRAMUSCULAR | Status: AC
  Administered 2024-04-02: 500 mg via INTRAMUSCULAR
  Filled 2024-04-02: qty 10

## 2024-04-14 DIAGNOSIS — N3020 Other chronic cystitis without hematuria: Secondary | ICD-10-CM | POA: Diagnosis not present

## 2024-04-28 DIAGNOSIS — H353212 Exudative age-related macular degeneration, right eye, with inactive choroidal neovascularization: Secondary | ICD-10-CM | POA: Diagnosis not present

## 2024-04-28 DIAGNOSIS — Z9889 Other specified postprocedural states: Secondary | ICD-10-CM | POA: Diagnosis not present

## 2024-04-28 DIAGNOSIS — H35722 Serous detachment of retinal pigment epithelium, left eye: Secondary | ICD-10-CM | POA: Diagnosis not present

## 2024-04-28 DIAGNOSIS — H3554 Dystrophies primarily involving the retinal pigment epithelium: Secondary | ICD-10-CM | POA: Diagnosis not present

## 2024-04-28 DIAGNOSIS — H353134 Nonexudative age-related macular degeneration, bilateral, advanced atrophic with subfoveal involvement: Secondary | ICD-10-CM | POA: Diagnosis not present

## 2024-04-29 ENCOUNTER — Other Ambulatory Visit: Payer: Self-pay | Admitting: *Deleted

## 2024-04-29 DIAGNOSIS — C50212 Malignant neoplasm of upper-inner quadrant of left female breast: Secondary | ICD-10-CM

## 2024-04-29 NOTE — Assessment & Plan Note (Signed)
 05/04/2016: Left breast UIQ T2 N0 grade 1 IDC ER/PR positive HER2 negative Ki-67 10% 05/09/2016: Biopsy second area UIQ left breast grade 2 IDC ER/PR positive HER2 negative Ki-67 15% 06/13/2016: Left lumpectomy: Grade 1 IDC T1c N0, bilateral reduction mammoplasty 08/02/2016: Left mastectomy: No evidence of malignancy November 2017: Oncotype DX score 18 (11% risk of distant recurrence) September 2017: Anastrozole  x 5 years completed November 2022 2023: Cutaneous diastases chest wall excision margin positive: Metastatic breast cancer ER 95%, PR 80%, HER2 2+ by IHC FISH negative 10/11/2023: PET/CT: No evidence of metastatic disease.  Fatty liver, gallstones   Current treatment: Faslodex  monthly Cutaneous metastasis: I recommend that we do a PET CT scan annually.      Patient will come monthly for injection appointments and I will see her back in January after her scan. Return to clinic monthly for injections and every 6 months of follow-up with me.

## 2024-04-30 ENCOUNTER — Inpatient Hospital Stay: Payer: Medicare Other

## 2024-04-30 ENCOUNTER — Inpatient Hospital Stay (HOSPITAL_BASED_OUTPATIENT_CLINIC_OR_DEPARTMENT_OTHER): Payer: Medicare Other | Admitting: Hematology and Oncology

## 2024-04-30 ENCOUNTER — Inpatient Hospital Stay: Payer: Medicare Other | Attending: Hematology and Oncology

## 2024-04-30 VITALS — BP 161/54 | HR 57 | Temp 98.9°F | Resp 19 | Ht 60.0 in

## 2024-04-30 DIAGNOSIS — Z1721 Progesterone receptor positive status: Secondary | ICD-10-CM | POA: Diagnosis not present

## 2024-04-30 DIAGNOSIS — C50212 Malignant neoplasm of upper-inner quadrant of left female breast: Secondary | ICD-10-CM

## 2024-04-30 DIAGNOSIS — Z79899 Other long term (current) drug therapy: Secondary | ICD-10-CM | POA: Diagnosis not present

## 2024-04-30 DIAGNOSIS — Z9012 Acquired absence of left breast and nipple: Secondary | ICD-10-CM | POA: Insufficient documentation

## 2024-04-30 DIAGNOSIS — Z170 Estrogen receptor positive status [ER+]: Secondary | ICD-10-CM | POA: Insufficient documentation

## 2024-04-30 DIAGNOSIS — Z1732 Human epidermal growth factor receptor 2 negative status: Secondary | ICD-10-CM | POA: Diagnosis not present

## 2024-04-30 DIAGNOSIS — C792 Secondary malignant neoplasm of skin: Secondary | ICD-10-CM | POA: Diagnosis not present

## 2024-04-30 DIAGNOSIS — Z79811 Long term (current) use of aromatase inhibitors: Secondary | ICD-10-CM | POA: Insufficient documentation

## 2024-04-30 DIAGNOSIS — Z7982 Long term (current) use of aspirin: Secondary | ICD-10-CM | POA: Insufficient documentation

## 2024-04-30 DIAGNOSIS — Z5111 Encounter for antineoplastic chemotherapy: Secondary | ICD-10-CM | POA: Insufficient documentation

## 2024-04-30 LAB — CMP (CANCER CENTER ONLY)
ALT: 11 U/L (ref 0–44)
AST: 13 U/L — ABNORMAL LOW (ref 15–41)
Albumin: 3.9 g/dL (ref 3.5–5.0)
Alkaline Phosphatase: 54 U/L (ref 38–126)
Anion gap: 7 (ref 5–15)
BUN: 27 mg/dL — ABNORMAL HIGH (ref 8–23)
CO2: 32 mmol/L (ref 22–32)
Calcium: 10 mg/dL (ref 8.9–10.3)
Chloride: 100 mmol/L (ref 98–111)
Creatinine: 0.96 mg/dL (ref 0.44–1.00)
GFR, Estimated: 59 mL/min — ABNORMAL LOW (ref >60)
Glucose, Bld: 149 mg/dL — ABNORMAL HIGH (ref 70–99)
Potassium: 3.4 mmol/L — ABNORMAL LOW (ref 3.5–5.1)
Sodium: 139 mmol/L (ref 135–145)
Total Bilirubin: 0.4 mg/dL (ref 0.0–1.2)
Total Protein: 6.5 g/dL (ref 6.5–8.1)

## 2024-04-30 LAB — CBC WITH DIFFERENTIAL (CANCER CENTER ONLY)
Abs Immature Granulocytes: 0.04 K/uL (ref 0.00–0.07)
Basophils Absolute: 0 K/uL (ref 0.0–0.1)
Basophils Relative: 0 %
Eosinophils Absolute: 0.2 K/uL (ref 0.0–0.5)
Eosinophils Relative: 2 %
HCT: 39.3 % (ref 36.0–46.0)
Hemoglobin: 13 g/dL (ref 12.0–15.0)
Immature Granulocytes: 1 %
Lymphocytes Relative: 14 %
Lymphs Abs: 1.1 K/uL (ref 0.7–4.0)
MCH: 29.9 pg (ref 26.0–34.0)
MCHC: 33.1 g/dL (ref 30.0–36.0)
MCV: 90.3 fL (ref 80.0–100.0)
Monocytes Absolute: 0.8 K/uL (ref 0.1–1.0)
Monocytes Relative: 10 %
Neutro Abs: 6.2 K/uL (ref 1.7–7.7)
Neutrophils Relative %: 73 %
Platelet Count: 224 K/uL (ref 150–400)
RBC: 4.35 MIL/uL (ref 3.87–5.11)
RDW: 13.2 % (ref 11.5–15.5)
WBC Count: 8.3 K/uL (ref 4.0–10.5)
nRBC: 0 % (ref 0.0–0.2)

## 2024-04-30 MED ORDER — FULVESTRANT 250 MG/5ML IM SOSY
500.0000 mg | PREFILLED_SYRINGE | Freq: Once | INTRAMUSCULAR | Status: AC
Start: 2024-04-30 — End: 2024-04-30
  Administered 2024-04-30: 500 mg via INTRAMUSCULAR
  Filled 2024-04-30: qty 10

## 2024-04-30 NOTE — Progress Notes (Signed)
 Patient Care Team: Cleotilde Planas, MD as PCP - General (Family Medicine) Ebbie Cough, MD as Consulting Physician (General Surgery) Arelia Filippo, MD as Consulting Physician (Plastic Surgery) Melodi Lerner, MD as Consulting Physician (Orthopedic Surgery) Dolphus Reiter, MD as Consulting Physician (Rheumatology) Crawford Morna Pickle, NP as Nurse Practitioner (Hematology and Oncology) Gaston Hamilton, MD as Consulting Physician (Urology) Odean Potts, MD as Consulting Physician (Hematology and Oncology)  DIAGNOSIS:  Encounter Diagnosis  Name Primary?   Malignant neoplasm of upper-inner quadrant of left breast in female, estrogen receptor positive (HCC) Yes    SUMMARY OF ONCOLOGIC HISTORY: Oncology History  Malignant neoplasm of upper-inner quadrant of left breast in female, estrogen receptor positive (HCC)  05/04/2016 Initial Biopsy   Left breast biopsy: IDC, grade 1, ER+(90%), PR+(90%), Ki-67 10%, HER-2 negative (ratio 1.19).   05/09/2016 Initial Biopsy   Left breast second area of concern: ILC, grade 2, ER+(100%), PR+(90%), Ki-67 15%, HER-2 negative (ratio 1.50).   05/31/2016 Initial Diagnosis   Malignant neoplasm of upper-inner quadrant of left breast in female, estrogen receptor positive (HCC)   06/13/2016 Surgery   Left breast lumpectomy: IDC grade 1, two foci, 1.7cm and 1.5 cm, margins negative, lobular neoplasia, 1 SLN with isolated tumor cells, 1 SLN negative.   T1c, N0 (i+)   06/13/2016 Oncotype testing   18/11%, intermediate risk   05/2016 -  Anti-estrogen oral therapy   Anastrozole  daily   06/13/2016 Cancer Staging   Staging form: Breast, AJCC 7th Edition - Pathologic stage from 06/13/2016: Stage IA (T1c, N0(i+), cM0) - Signed by Izell Domino, MD on 01/19/2023 Stage prefix: Initial diagnosis Histologic grade (G): G1 Estrogen receptor status: Positive Progesterone receptor status: Positive HER2 status: Negative   06/25/2016 Surgery   Bilateral  reduction  mammoplasty   07/11/2016 Surgery   Debridement of left nipple necrosis   08/02/2016 Surgery   Left simple mastectomy showing inflammation and abscess but no malignancy   01/18/2023 Cancer Staging   Staging form: Breast, AJCC 7th Edition - Pathologic stage from 01/18/2023: Stage Unknown (rT4b, NX, cM0) - Signed by Izell Domino, MD on 01/19/2023 Stage prefix: Recurrence Laterality: Left     CHIEF COMPLIANT:   HISTORY OF PRESENT ILLNESS:   History of Present Illness Debra Griffith is an 83 year old female who presents with chronic leg swelling and breathing issues. She is accompanied by her husband, Sharolyn.  She experiences nocturnal breathing issues described as congestion, relieved by nebulized albuterol  2.5 mg. Her husband assists with these treatments. Chronic leg swelling is present with associated blisters, managed with Aveeno moisturizer and occasional cold wraps. She has a history of cancer and receives regular monthly injections, with a previous painful experience during an [place] in the city.     ALLERGIES:  is allergic to morphine  and codeine and other.  MEDICATIONS:  Current Outpatient Medications  Medication Sig Dispense Refill   aspirin EC 81 MG tablet Take 81 mg by mouth daily. Swallow whole.     Calcium Carbonate Antacid (TUMS PO) Take 1 tablet by mouth as needed (heartburn).     Cholecalciferol (VITAMIN D) 125 MCG (5000 UT) CAPS Take 5,000 Units by mouth daily.     clonazePAM  (KLONOPIN ) 1 MG tablet Take 1 tablet (1 mg total) by mouth daily. 30 tablet 0   cyclobenzaprine  (FLEXERIL ) 10 MG tablet Take 20 mg by mouth at bedtime.     ferrous sulfate  325 (65 FE) MG EC tablet Take 325 mg by mouth daily.  furosemide (LASIX) 20 MG tablet Take 20 mg by mouth daily.     HYDROcodone-acetaminophen  (NORCO) 10-325 MG tablet Take 1 tablet by mouth 5 (five) times daily as needed for moderate pain.     metoprolol  tartrate (LOPRESSOR ) 25 MG tablet Take 25 mg by mouth 2  (two) times daily.      Multiple Vitamins-Minerals (PRESERVISION AREDS 2+MULTI VIT PO) Take 1 capsule by mouth in the morning and at bedtime.     nitrofurantoin  (MACRODANTIN ) 100 MG capsule Take 1 capsule (100 mg total) by mouth at bedtime.     OVER THE COUNTER MEDICATION Take 1 tablet by mouth daily. Bone up     OVER THE COUNTER MEDICATION Take 2 tablets by mouth daily as needed (constipation). Swiss Kriss     rosuvastatin (CRESTOR) 5 MG tablet Take 5 mg by mouth daily.     triamterene -hydrochlorothiazide  (MAXZIDE -25) 37.5-25 MG tablet Take 1 tablet by mouth daily.     valsartan  (DIOVAN ) 320 MG tablet Take 320 mg by mouth daily.     venlafaxine  XR (EFFEXOR -XR) 75 MG 24 hr capsule TAKE 1 CAPSULE BY MOUTH ONCE DAILY FOR 7 DAYS, THEN INCREASE TO  2 ONCE DAILY 60 capsule 0   zolpidem  (AMBIEN ) 10 MG tablet Take 10 mg by mouth at bedtime.     No current facility-administered medications for this visit.   Facility-Administered Medications Ordered in Other Visits  Medication Dose Route Frequency Provider Last Rate Last Admin   fulvestrant  (FASLODEX ) injection 500 mg  500 mg Intramuscular Once Matej Sappenfield, MD       methocarbamol  (ROBAXIN ) tablet 500 mg  500 mg Oral Q6H PRN Ebbie Cough, MD       metroNIDAZOLE  (FLAGYL ) tablet 500 mg  500 mg Oral Q8H Ebbie Cough, MD       ondansetron  (ZOFRAN -ODT) disintegrating tablet 4 mg  4 mg Oral Q6H PRN Ebbie Cough, MD       Or   ondansetron  (ZOFRAN ) 4 mg in sodium chloride  0.9 % 50 mL IVPB  4 mg Intravenous Q6H PRN Ebbie Cough, MD       oxyCODONE  (Oxy IR/ROXICODONE ) immediate release tablet 5-10 mg  5-10 mg Oral Q4H PRN Ebbie Cough, MD       simethicone  [facility]) chewable tablet 40 mg  40 mg Oral Q6H PRN Ebbie Cough, MD        PHYSICAL EXAMINATION: ECOG PERFORMANCE STATUS: 1 - Symptomatic but completely ambulatory  Vitals:   04/30/24 1446  BP: (!) 161/54  Pulse: (!) 57  Resp: 19  Temp: 98.9 F (37.2 C)  SpO2:  99%   Filed Weights     LABORATORY DATA:  I have reviewed the data as listed    Latest Ref Rng & Units 04/30/2024    2:20 PM 11/14/2023    2:16 PM 04/12/2023   12:53 PM  CMP  Glucose 70 - 99 mg/dL 850  870  845   BUN 8 - 23 mg/dL 27  26  25    Creatinine 0.44 - 1.00 mg/dL 9.03  9.01  9.18   Sodium 135 - 145 mmol/L 139  138  140   Potassium 3.5 - 5.1 mmol/L 3.4  4.2  3.2   Chloride 98 - 111 mmol/L 100  101  104   CO2 22 - 32 mmol/L 32  31  27   Calcium 8.9 - 10.3 mg/dL 89.9  89.7  9.9   Total Protein 6.5 - 8.1 g/dL 6.5  7.0  6.2  Total Bilirubin 0.0 - 1.2 mg/dL 0.4  0.4  0.3   Alkaline Phos 38 - 126 U/L 54  79  90   AST 15 - 41 U/L 13  15  17    ALT 0 - 44 U/L 11  14  15      Lab Results  Component Value Date   WBC 8.3 04/30/2024   HGB 13.0 04/30/2024   HCT 39.3 04/30/2024   MCV 90.3 04/30/2024   PLT 224 04/30/2024   NEUTROABS 6.2 04/30/2024    ASSESSMENT & PLAN:  Malignant neoplasm of upper-inner quadrant of left breast in female, estrogen receptor positive (HCC) 05/04/2016: Left breast UIQ T2 N0 grade 1 IDC ER/PR positive HER2 negative Ki-67 10% 05/09/2016: Biopsy second area UIQ left breast grade 2 IDC ER/PR positive HER2 negative Ki-67 15% 06/13/2016: Left lumpectomy: Grade 1 IDC T1c N0, bilateral reduction mammoplasty 08/02/2016: Left mastectomy: No evidence of malignancy November 2017: Oncotype DX score 18 (11% risk of distant recurrence) September 2017: Anastrozole  x 5 years completed November 2022 2023: Cutaneous diastases chest wall excision margin positive: Metastatic breast cancer ER 95%, PR 80%, HER2 2+ by IHC FISH negative 10/11/2023: PET/CT: No evidence of metastatic disease.  Fatty liver, gallstones   Current treatment: Faslodex  monthly Cutaneous metastasis: I recommend that we do a PET CT scan annually.    Breast: Patient is applying   Patient will come monthly for injection appointments and I will see her back in January after her scan. Return to clinic  monthly for injections and every 6 months of follow-up with me.   Assessment & Plan Malignant neoplasm of upper-inner quadrant of left breast, ER+ Breast cancer managed with monthly injections, stable with no progression or new symptoms. - Schedule whole body PET scan for early February.  Chronic lower extremity edema with skin changes Chronic edema with skin changes, including blisters, likely from fluid retention. Skin not infected. - Continue using Aveeno as a moisturizer. - Prescribe Bacitracin  ointment for skin care.  Chronic obstructive pulmonary disease with nocturnal congestion Nocturnal congestion managed with albuterol  nebulizer, associated with phlegm, not wheezing. Nebulizer provides relief. - Continue albuterol  nebulizer treatments as needed.  Anxiety Anxiety related to macular degeneration significantly impacts well-being.      Orders Placed This Encounter  Procedures   NM PET Image Restag (PS) Skull Base To Thigh    Standing Status:   Future    Expected Date:   10/23/2024    Expiration Date:   04/30/2025    If indicated for the ordered procedure, I authorize the administration of a radiopharmaceutical per Radiology protocol:   Yes    Preferred imaging location?:   [facility]    Release to patient:   Immediate   CBC with Differential (Cancer Center Only)    Standing Status:   Future    Expiration Date:   04/30/2025   CMP (Cancer Center only)    Standing Status:   Future    Expiration Date:   04/30/2025   The patient has a good understanding of the overall plan. she agrees with it. she will call with any problems that may develop before the next visit here. Total time spent: 30 mins including face to face time and time spent for planning, charting and co-ordination of care   Naomi MARLA Chad, MD 04/30/24

## 2024-05-05 DIAGNOSIS — R350 Frequency of micturition: Secondary | ICD-10-CM | POA: Diagnosis not present

## 2024-05-13 DIAGNOSIS — Z23 Encounter for immunization: Secondary | ICD-10-CM | POA: Diagnosis not present

## 2024-05-13 DIAGNOSIS — G4700 Insomnia, unspecified: Secondary | ICD-10-CM | POA: Diagnosis not present

## 2024-05-13 DIAGNOSIS — R2689 Other abnormalities of gait and mobility: Secondary | ICD-10-CM | POA: Diagnosis not present

## 2024-05-13 DIAGNOSIS — E7800 Pure hypercholesterolemia, unspecified: Secondary | ICD-10-CM | POA: Diagnosis not present

## 2024-05-13 DIAGNOSIS — Z0000 Encounter for general adult medical examination without abnormal findings: Secondary | ICD-10-CM | POA: Diagnosis not present

## 2024-05-13 DIAGNOSIS — R600 Localized edema: Secondary | ICD-10-CM | POA: Diagnosis not present

## 2024-05-13 DIAGNOSIS — E119 Type 2 diabetes mellitus without complications: Secondary | ICD-10-CM | POA: Diagnosis not present

## 2024-05-13 DIAGNOSIS — I10 Essential (primary) hypertension: Secondary | ICD-10-CM | POA: Diagnosis not present

## 2024-05-13 DIAGNOSIS — R531 Weakness: Secondary | ICD-10-CM | POA: Diagnosis not present

## 2024-05-20 DIAGNOSIS — E119 Type 2 diabetes mellitus without complications: Secondary | ICD-10-CM | POA: Diagnosis not present

## 2024-05-20 DIAGNOSIS — Z7982 Long term (current) use of aspirin: Secondary | ICD-10-CM | POA: Diagnosis not present

## 2024-05-20 DIAGNOSIS — I4891 Unspecified atrial fibrillation: Secondary | ICD-10-CM | POA: Diagnosis not present

## 2024-05-20 DIAGNOSIS — M064 Inflammatory polyarthropathy: Secondary | ICD-10-CM | POA: Diagnosis not present

## 2024-05-20 DIAGNOSIS — M109 Gout, unspecified: Secondary | ICD-10-CM | POA: Diagnosis not present

## 2024-05-20 DIAGNOSIS — Z604 Social exclusion and rejection: Secondary | ICD-10-CM | POA: Diagnosis not present

## 2024-05-20 DIAGNOSIS — Z87440 Personal history of urinary (tract) infections: Secondary | ICD-10-CM | POA: Diagnosis not present

## 2024-05-20 DIAGNOSIS — E559 Vitamin D deficiency, unspecified: Secondary | ICD-10-CM | POA: Diagnosis not present

## 2024-05-20 DIAGNOSIS — I4510 Unspecified right bundle-branch block: Secondary | ICD-10-CM | POA: Diagnosis not present

## 2024-05-20 DIAGNOSIS — E7800 Pure hypercholesterolemia, unspecified: Secondary | ICD-10-CM | POA: Diagnosis not present

## 2024-05-20 DIAGNOSIS — Z853 Personal history of malignant neoplasm of breast: Secondary | ICD-10-CM | POA: Diagnosis not present

## 2024-05-20 DIAGNOSIS — I10 Essential (primary) hypertension: Secondary | ICD-10-CM | POA: Diagnosis not present

## 2024-05-22 DIAGNOSIS — E7800 Pure hypercholesterolemia, unspecified: Secondary | ICD-10-CM | POA: Diagnosis not present

## 2024-05-22 DIAGNOSIS — E559 Vitamin D deficiency, unspecified: Secondary | ICD-10-CM | POA: Diagnosis not present

## 2024-05-22 DIAGNOSIS — Z87440 Personal history of urinary (tract) infections: Secondary | ICD-10-CM | POA: Diagnosis not present

## 2024-05-22 DIAGNOSIS — R600 Localized edema: Secondary | ICD-10-CM | POA: Diagnosis not present

## 2024-05-22 DIAGNOSIS — Z604 Social exclusion and rejection: Secondary | ICD-10-CM | POA: Diagnosis not present

## 2024-05-22 DIAGNOSIS — M109 Gout, unspecified: Secondary | ICD-10-CM | POA: Diagnosis not present

## 2024-05-22 DIAGNOSIS — I4510 Unspecified right bundle-branch block: Secondary | ICD-10-CM | POA: Diagnosis not present

## 2024-05-22 DIAGNOSIS — R2689 Other abnormalities of gait and mobility: Secondary | ICD-10-CM | POA: Diagnosis not present

## 2024-05-22 DIAGNOSIS — R531 Weakness: Secondary | ICD-10-CM | POA: Diagnosis not present

## 2024-05-22 DIAGNOSIS — M064 Inflammatory polyarthropathy: Secondary | ICD-10-CM | POA: Diagnosis not present

## 2024-05-22 DIAGNOSIS — Z7982 Long term (current) use of aspirin: Secondary | ICD-10-CM | POA: Diagnosis not present

## 2024-05-22 DIAGNOSIS — Z853 Personal history of malignant neoplasm of breast: Secondary | ICD-10-CM | POA: Diagnosis not present

## 2024-05-22 DIAGNOSIS — I10 Essential (primary) hypertension: Secondary | ICD-10-CM | POA: Diagnosis not present

## 2024-05-22 DIAGNOSIS — I4891 Unspecified atrial fibrillation: Secondary | ICD-10-CM | POA: Diagnosis not present

## 2024-05-22 DIAGNOSIS — E119 Type 2 diabetes mellitus without complications: Secondary | ICD-10-CM | POA: Diagnosis not present

## 2024-05-22 DIAGNOSIS — G4700 Insomnia, unspecified: Secondary | ICD-10-CM | POA: Diagnosis not present

## 2024-05-27 DIAGNOSIS — E559 Vitamin D deficiency, unspecified: Secondary | ICD-10-CM | POA: Diagnosis not present

## 2024-05-27 DIAGNOSIS — I10 Essential (primary) hypertension: Secondary | ICD-10-CM | POA: Diagnosis not present

## 2024-05-27 DIAGNOSIS — E7800 Pure hypercholesterolemia, unspecified: Secondary | ICD-10-CM | POA: Diagnosis not present

## 2024-05-27 DIAGNOSIS — I4891 Unspecified atrial fibrillation: Secondary | ICD-10-CM | POA: Diagnosis not present

## 2024-05-27 DIAGNOSIS — I4510 Unspecified right bundle-branch block: Secondary | ICD-10-CM | POA: Diagnosis not present

## 2024-05-27 DIAGNOSIS — Z604 Social exclusion and rejection: Secondary | ICD-10-CM | POA: Diagnosis not present

## 2024-05-27 DIAGNOSIS — M109 Gout, unspecified: Secondary | ICD-10-CM | POA: Diagnosis not present

## 2024-05-27 DIAGNOSIS — Z7982 Long term (current) use of aspirin: Secondary | ICD-10-CM | POA: Diagnosis not present

## 2024-05-27 DIAGNOSIS — M064 Inflammatory polyarthropathy: Secondary | ICD-10-CM | POA: Diagnosis not present

## 2024-05-27 DIAGNOSIS — E119 Type 2 diabetes mellitus without complications: Secondary | ICD-10-CM | POA: Diagnosis not present

## 2024-05-27 DIAGNOSIS — Z853 Personal history of malignant neoplasm of breast: Secondary | ICD-10-CM | POA: Diagnosis not present

## 2024-05-27 DIAGNOSIS — Z87440 Personal history of urinary (tract) infections: Secondary | ICD-10-CM | POA: Diagnosis not present

## 2024-05-28 ENCOUNTER — Inpatient Hospital Stay: Attending: Hematology and Oncology

## 2024-05-28 VITALS — BP 141/50 | HR 64 | Temp 98.8°F | Resp 18

## 2024-05-28 DIAGNOSIS — C50212 Malignant neoplasm of upper-inner quadrant of left female breast: Secondary | ICD-10-CM | POA: Diagnosis not present

## 2024-05-28 DIAGNOSIS — Z170 Estrogen receptor positive status [ER+]: Secondary | ICD-10-CM | POA: Insufficient documentation

## 2024-05-28 DIAGNOSIS — Z5111 Encounter for antineoplastic chemotherapy: Secondary | ICD-10-CM | POA: Diagnosis not present

## 2024-05-28 DIAGNOSIS — Z1721 Progesterone receptor positive status: Secondary | ICD-10-CM | POA: Insufficient documentation

## 2024-05-28 DIAGNOSIS — Z1732 Human epidermal growth factor receptor 2 negative status: Secondary | ICD-10-CM | POA: Insufficient documentation

## 2024-05-28 MED ORDER — FULVESTRANT 250 MG/5ML IM SOSY
500.0000 mg | PREFILLED_SYRINGE | Freq: Once | INTRAMUSCULAR | Status: AC
  Administered 2024-05-28: 500 mg via INTRAMUSCULAR
  Filled 2024-05-28: qty 10

## 2024-05-29 DIAGNOSIS — E119 Type 2 diabetes mellitus without complications: Secondary | ICD-10-CM | POA: Diagnosis not present

## 2024-05-29 DIAGNOSIS — M064 Inflammatory polyarthropathy: Secondary | ICD-10-CM | POA: Diagnosis not present

## 2024-05-29 DIAGNOSIS — Z853 Personal history of malignant neoplasm of breast: Secondary | ICD-10-CM | POA: Diagnosis not present

## 2024-05-29 DIAGNOSIS — E7800 Pure hypercholesterolemia, unspecified: Secondary | ICD-10-CM | POA: Diagnosis not present

## 2024-05-29 DIAGNOSIS — I10 Essential (primary) hypertension: Secondary | ICD-10-CM | POA: Diagnosis not present

## 2024-05-29 DIAGNOSIS — Z87440 Personal history of urinary (tract) infections: Secondary | ICD-10-CM | POA: Diagnosis not present

## 2024-05-29 DIAGNOSIS — Z7982 Long term (current) use of aspirin: Secondary | ICD-10-CM | POA: Diagnosis not present

## 2024-05-29 DIAGNOSIS — E559 Vitamin D deficiency, unspecified: Secondary | ICD-10-CM | POA: Diagnosis not present

## 2024-05-29 DIAGNOSIS — I4510 Unspecified right bundle-branch block: Secondary | ICD-10-CM | POA: Diagnosis not present

## 2024-05-29 DIAGNOSIS — I4891 Unspecified atrial fibrillation: Secondary | ICD-10-CM | POA: Diagnosis not present

## 2024-05-29 DIAGNOSIS — Z604 Social exclusion and rejection: Secondary | ICD-10-CM | POA: Diagnosis not present

## 2024-05-29 DIAGNOSIS — M109 Gout, unspecified: Secondary | ICD-10-CM | POA: Diagnosis not present

## 2024-06-03 DIAGNOSIS — I4510 Unspecified right bundle-branch block: Secondary | ICD-10-CM | POA: Diagnosis not present

## 2024-06-03 DIAGNOSIS — I10 Essential (primary) hypertension: Secondary | ICD-10-CM | POA: Diagnosis not present

## 2024-06-03 DIAGNOSIS — E119 Type 2 diabetes mellitus without complications: Secondary | ICD-10-CM | POA: Diagnosis not present

## 2024-06-03 DIAGNOSIS — Z87440 Personal history of urinary (tract) infections: Secondary | ICD-10-CM | POA: Diagnosis not present

## 2024-06-03 DIAGNOSIS — M064 Inflammatory polyarthropathy: Secondary | ICD-10-CM | POA: Diagnosis not present

## 2024-06-03 DIAGNOSIS — Z7982 Long term (current) use of aspirin: Secondary | ICD-10-CM | POA: Diagnosis not present

## 2024-06-03 DIAGNOSIS — E7800 Pure hypercholesterolemia, unspecified: Secondary | ICD-10-CM | POA: Diagnosis not present

## 2024-06-03 DIAGNOSIS — I4891 Unspecified atrial fibrillation: Secondary | ICD-10-CM | POA: Diagnosis not present

## 2024-06-03 DIAGNOSIS — M109 Gout, unspecified: Secondary | ICD-10-CM | POA: Diagnosis not present

## 2024-06-03 DIAGNOSIS — Z604 Social exclusion and rejection: Secondary | ICD-10-CM | POA: Diagnosis not present

## 2024-06-03 DIAGNOSIS — Z853 Personal history of malignant neoplasm of breast: Secondary | ICD-10-CM | POA: Diagnosis not present

## 2024-06-03 DIAGNOSIS — E559 Vitamin D deficiency, unspecified: Secondary | ICD-10-CM | POA: Diagnosis not present

## 2024-06-04 DIAGNOSIS — I4510 Unspecified right bundle-branch block: Secondary | ICD-10-CM | POA: Diagnosis not present

## 2024-06-04 DIAGNOSIS — Z7982 Long term (current) use of aspirin: Secondary | ICD-10-CM | POA: Diagnosis not present

## 2024-06-04 DIAGNOSIS — Z604 Social exclusion and rejection: Secondary | ICD-10-CM | POA: Diagnosis not present

## 2024-06-04 DIAGNOSIS — Z853 Personal history of malignant neoplasm of breast: Secondary | ICD-10-CM | POA: Diagnosis not present

## 2024-06-04 DIAGNOSIS — M109 Gout, unspecified: Secondary | ICD-10-CM | POA: Diagnosis not present

## 2024-06-04 DIAGNOSIS — I4891 Unspecified atrial fibrillation: Secondary | ICD-10-CM | POA: Diagnosis not present

## 2024-06-04 DIAGNOSIS — E119 Type 2 diabetes mellitus without complications: Secondary | ICD-10-CM | POA: Diagnosis not present

## 2024-06-04 DIAGNOSIS — M064 Inflammatory polyarthropathy: Secondary | ICD-10-CM | POA: Diagnosis not present

## 2024-06-04 DIAGNOSIS — E559 Vitamin D deficiency, unspecified: Secondary | ICD-10-CM | POA: Diagnosis not present

## 2024-06-04 DIAGNOSIS — I10 Essential (primary) hypertension: Secondary | ICD-10-CM | POA: Diagnosis not present

## 2024-06-04 DIAGNOSIS — Z87440 Personal history of urinary (tract) infections: Secondary | ICD-10-CM | POA: Diagnosis not present

## 2024-06-04 DIAGNOSIS — E7800 Pure hypercholesterolemia, unspecified: Secondary | ICD-10-CM | POA: Diagnosis not present

## 2024-06-09 DIAGNOSIS — E119 Type 2 diabetes mellitus without complications: Secondary | ICD-10-CM | POA: Diagnosis not present

## 2024-06-09 DIAGNOSIS — M064 Inflammatory polyarthropathy: Secondary | ICD-10-CM | POA: Diagnosis not present

## 2024-06-09 DIAGNOSIS — I10 Essential (primary) hypertension: Secondary | ICD-10-CM | POA: Diagnosis not present

## 2024-06-09 DIAGNOSIS — I4891 Unspecified atrial fibrillation: Secondary | ICD-10-CM | POA: Diagnosis not present

## 2024-06-09 DIAGNOSIS — Z604 Social exclusion and rejection: Secondary | ICD-10-CM | POA: Diagnosis not present

## 2024-06-09 DIAGNOSIS — E7800 Pure hypercholesterolemia, unspecified: Secondary | ICD-10-CM | POA: Diagnosis not present

## 2024-06-09 DIAGNOSIS — M109 Gout, unspecified: Secondary | ICD-10-CM | POA: Diagnosis not present

## 2024-06-09 DIAGNOSIS — Z7982 Long term (current) use of aspirin: Secondary | ICD-10-CM | POA: Diagnosis not present

## 2024-06-09 DIAGNOSIS — I4510 Unspecified right bundle-branch block: Secondary | ICD-10-CM | POA: Diagnosis not present

## 2024-06-09 DIAGNOSIS — E559 Vitamin D deficiency, unspecified: Secondary | ICD-10-CM | POA: Diagnosis not present

## 2024-06-09 DIAGNOSIS — Z853 Personal history of malignant neoplasm of breast: Secondary | ICD-10-CM | POA: Diagnosis not present

## 2024-06-09 DIAGNOSIS — Z87440 Personal history of urinary (tract) infections: Secondary | ICD-10-CM | POA: Diagnosis not present

## 2024-06-10 DIAGNOSIS — M109 Gout, unspecified: Secondary | ICD-10-CM | POA: Diagnosis not present

## 2024-06-10 DIAGNOSIS — I10 Essential (primary) hypertension: Secondary | ICD-10-CM | POA: Diagnosis not present

## 2024-06-10 DIAGNOSIS — E119 Type 2 diabetes mellitus without complications: Secondary | ICD-10-CM | POA: Diagnosis not present

## 2024-06-10 DIAGNOSIS — Z7982 Long term (current) use of aspirin: Secondary | ICD-10-CM | POA: Diagnosis not present

## 2024-06-10 DIAGNOSIS — I4891 Unspecified atrial fibrillation: Secondary | ICD-10-CM | POA: Diagnosis not present

## 2024-06-10 DIAGNOSIS — Z87440 Personal history of urinary (tract) infections: Secondary | ICD-10-CM | POA: Diagnosis not present

## 2024-06-10 DIAGNOSIS — M064 Inflammatory polyarthropathy: Secondary | ICD-10-CM | POA: Diagnosis not present

## 2024-06-10 DIAGNOSIS — Z604 Social exclusion and rejection: Secondary | ICD-10-CM | POA: Diagnosis not present

## 2024-06-10 DIAGNOSIS — E7800 Pure hypercholesterolemia, unspecified: Secondary | ICD-10-CM | POA: Diagnosis not present

## 2024-06-10 DIAGNOSIS — I4510 Unspecified right bundle-branch block: Secondary | ICD-10-CM | POA: Diagnosis not present

## 2024-06-10 DIAGNOSIS — Z853 Personal history of malignant neoplasm of breast: Secondary | ICD-10-CM | POA: Diagnosis not present

## 2024-06-10 DIAGNOSIS — E559 Vitamin D deficiency, unspecified: Secondary | ICD-10-CM | POA: Diagnosis not present

## 2024-06-15 DIAGNOSIS — Z87440 Personal history of urinary (tract) infections: Secondary | ICD-10-CM | POA: Diagnosis not present

## 2024-06-15 DIAGNOSIS — Z604 Social exclusion and rejection: Secondary | ICD-10-CM | POA: Diagnosis not present

## 2024-06-15 DIAGNOSIS — E119 Type 2 diabetes mellitus without complications: Secondary | ICD-10-CM | POA: Diagnosis not present

## 2024-06-15 DIAGNOSIS — M064 Inflammatory polyarthropathy: Secondary | ICD-10-CM | POA: Diagnosis not present

## 2024-06-15 DIAGNOSIS — I4510 Unspecified right bundle-branch block: Secondary | ICD-10-CM | POA: Diagnosis not present

## 2024-06-15 DIAGNOSIS — M109 Gout, unspecified: Secondary | ICD-10-CM | POA: Diagnosis not present

## 2024-06-15 DIAGNOSIS — Z7982 Long term (current) use of aspirin: Secondary | ICD-10-CM | POA: Diagnosis not present

## 2024-06-15 DIAGNOSIS — Z853 Personal history of malignant neoplasm of breast: Secondary | ICD-10-CM | POA: Diagnosis not present

## 2024-06-15 DIAGNOSIS — I10 Essential (primary) hypertension: Secondary | ICD-10-CM | POA: Diagnosis not present

## 2024-06-15 DIAGNOSIS — E559 Vitamin D deficiency, unspecified: Secondary | ICD-10-CM | POA: Diagnosis not present

## 2024-06-15 DIAGNOSIS — I4891 Unspecified atrial fibrillation: Secondary | ICD-10-CM | POA: Diagnosis not present

## 2024-06-15 DIAGNOSIS — E7800 Pure hypercholesterolemia, unspecified: Secondary | ICD-10-CM | POA: Diagnosis not present

## 2024-06-16 DIAGNOSIS — I10 Essential (primary) hypertension: Secondary | ICD-10-CM | POA: Diagnosis not present

## 2024-06-16 DIAGNOSIS — E559 Vitamin D deficiency, unspecified: Secondary | ICD-10-CM | POA: Diagnosis not present

## 2024-06-16 DIAGNOSIS — M109 Gout, unspecified: Secondary | ICD-10-CM | POA: Diagnosis not present

## 2024-06-16 DIAGNOSIS — E119 Type 2 diabetes mellitus without complications: Secondary | ICD-10-CM | POA: Diagnosis not present

## 2024-06-16 DIAGNOSIS — I4510 Unspecified right bundle-branch block: Secondary | ICD-10-CM | POA: Diagnosis not present

## 2024-06-16 DIAGNOSIS — E7800 Pure hypercholesterolemia, unspecified: Secondary | ICD-10-CM | POA: Diagnosis not present

## 2024-06-16 DIAGNOSIS — I4891 Unspecified atrial fibrillation: Secondary | ICD-10-CM | POA: Diagnosis not present

## 2024-06-16 DIAGNOSIS — Z604 Social exclusion and rejection: Secondary | ICD-10-CM | POA: Diagnosis not present

## 2024-06-16 DIAGNOSIS — Z87440 Personal history of urinary (tract) infections: Secondary | ICD-10-CM | POA: Diagnosis not present

## 2024-06-16 DIAGNOSIS — Z853 Personal history of malignant neoplasm of breast: Secondary | ICD-10-CM | POA: Diagnosis not present

## 2024-06-16 DIAGNOSIS — Z7982 Long term (current) use of aspirin: Secondary | ICD-10-CM | POA: Diagnosis not present

## 2024-06-16 DIAGNOSIS — M064 Inflammatory polyarthropathy: Secondary | ICD-10-CM | POA: Diagnosis not present

## 2024-06-17 DIAGNOSIS — Z23 Encounter for immunization: Secondary | ICD-10-CM | POA: Diagnosis not present

## 2024-06-17 DIAGNOSIS — G4700 Insomnia, unspecified: Secondary | ICD-10-CM | POA: Diagnosis not present

## 2024-06-17 DIAGNOSIS — R600 Localized edema: Secondary | ICD-10-CM | POA: Diagnosis not present

## 2024-06-17 DIAGNOSIS — R2689 Other abnormalities of gait and mobility: Secondary | ICD-10-CM | POA: Diagnosis not present

## 2024-06-17 DIAGNOSIS — C799 Secondary malignant neoplasm of unspecified site: Secondary | ICD-10-CM | POA: Diagnosis not present

## 2024-06-17 DIAGNOSIS — I10 Essential (primary) hypertension: Secondary | ICD-10-CM | POA: Diagnosis not present

## 2024-06-17 DIAGNOSIS — E119 Type 2 diabetes mellitus without complications: Secondary | ICD-10-CM | POA: Diagnosis not present

## 2024-06-17 DIAGNOSIS — C50919 Malignant neoplasm of unspecified site of unspecified female breast: Secondary | ICD-10-CM | POA: Diagnosis not present

## 2024-06-25 ENCOUNTER — Inpatient Hospital Stay: Attending: Hematology and Oncology

## 2024-06-25 DIAGNOSIS — C50212 Malignant neoplasm of upper-inner quadrant of left female breast: Secondary | ICD-10-CM | POA: Diagnosis present

## 2024-06-25 DIAGNOSIS — Z170 Estrogen receptor positive status [ER+]: Secondary | ICD-10-CM | POA: Diagnosis not present

## 2024-06-25 DIAGNOSIS — K5903 Drug induced constipation: Secondary | ICD-10-CM | POA: Diagnosis not present

## 2024-06-25 DIAGNOSIS — Z79818 Long term (current) use of other agents affecting estrogen receptors and estrogen levels: Secondary | ICD-10-CM | POA: Diagnosis not present

## 2024-06-25 DIAGNOSIS — G894 Chronic pain syndrome: Secondary | ICD-10-CM | POA: Diagnosis not present

## 2024-06-25 DIAGNOSIS — M51362 Other intervertebral disc degeneration, lumbar region with discogenic back pain and lower extremity pain: Secondary | ICD-10-CM | POA: Diagnosis not present

## 2024-06-25 MED ORDER — FULVESTRANT 250 MG/5ML IM SOSY
500.0000 mg | PREFILLED_SYRINGE | Freq: Once | INTRAMUSCULAR | Status: AC
  Administered 2024-06-25: 500 mg via INTRAMUSCULAR
  Filled 2024-06-25: qty 10

## 2024-06-26 DIAGNOSIS — N3281 Overactive bladder: Secondary | ICD-10-CM | POA: Diagnosis not present

## 2024-07-13 DIAGNOSIS — N3281 Overactive bladder: Secondary | ICD-10-CM | POA: Diagnosis not present

## 2024-07-23 ENCOUNTER — Inpatient Hospital Stay: Attending: Hematology and Oncology

## 2024-07-23 VITALS — BP 136/70 | HR 67 | Temp 97.9°F | Resp 16

## 2024-07-23 DIAGNOSIS — Z79818 Long term (current) use of other agents affecting estrogen receptors and estrogen levels: Secondary | ICD-10-CM | POA: Insufficient documentation

## 2024-07-23 DIAGNOSIS — Z170 Estrogen receptor positive status [ER+]: Secondary | ICD-10-CM | POA: Insufficient documentation

## 2024-07-23 DIAGNOSIS — C50212 Malignant neoplasm of upper-inner quadrant of left female breast: Secondary | ICD-10-CM | POA: Diagnosis present

## 2024-07-23 MED ORDER — FULVESTRANT 250 MG/5ML IM SOSY
500.0000 mg | PREFILLED_SYRINGE | Freq: Once | INTRAMUSCULAR | Status: AC
  Administered 2024-07-23: 500 mg via INTRAMUSCULAR
  Filled 2024-07-23: qty 10

## 2024-08-20 ENCOUNTER — Inpatient Hospital Stay: Attending: Hematology and Oncology

## 2024-08-20 VITALS — BP 124/61 | HR 81 | Resp 17

## 2024-08-20 DIAGNOSIS — Z79818 Long term (current) use of other agents affecting estrogen receptors and estrogen levels: Secondary | ICD-10-CM | POA: Diagnosis not present

## 2024-08-20 DIAGNOSIS — Z170 Estrogen receptor positive status [ER+]: Secondary | ICD-10-CM | POA: Insufficient documentation

## 2024-08-20 DIAGNOSIS — C50212 Malignant neoplasm of upper-inner quadrant of left female breast: Secondary | ICD-10-CM | POA: Diagnosis present

## 2024-08-20 MED ORDER — FULVESTRANT 250 MG/5ML IM SOSY
500.0000 mg | PREFILLED_SYRINGE | Freq: Once | INTRAMUSCULAR | Status: AC
  Administered 2024-08-20: 500 mg via INTRAMUSCULAR
  Filled 2024-08-20: qty 10

## 2024-09-23 ENCOUNTER — Other Ambulatory Visit: Payer: Self-pay

## 2024-09-23 ENCOUNTER — Encounter: Payer: Self-pay | Admitting: Hematology and Oncology

## 2024-09-23 MED ORDER — LORAZEPAM 0.5 MG PO TABS: 0.5000 mg | ORAL_TABLET | Freq: Two times a day (BID) | ORAL | 0 refills | Status: AC

## 2024-09-23 NOTE — Progress Notes (Signed)
 Pt requested RX for claustrophobia for PET. Per MD verbal order placed w/ pharmacist at [facility]. Pt spouse notified and educated on instructions for taking the medication which are as follows; take 1 table (0.5 mg total) by mouth 2 (two) times daily. Take 1 table 1 hour piror to scan, may repeat with second tablet 30 mins prior to scan for claustrophobia.  RN educated pt spouse that driver is needed to and from appointment as well. Pt spouse verbalized understanding.

## 2024-09-24 ENCOUNTER — Inpatient Hospital Stay: Payer: Self-pay | Attending: Hematology and Oncology

## 2024-09-24 VITALS — BP 127/71 | HR 106

## 2024-09-24 DIAGNOSIS — Z170 Estrogen receptor positive status [ER+]: Secondary | ICD-10-CM | POA: Diagnosis not present

## 2024-09-24 DIAGNOSIS — C50212 Malignant neoplasm of upper-inner quadrant of left female breast: Secondary | ICD-10-CM | POA: Insufficient documentation

## 2024-09-24 DIAGNOSIS — Z79818 Long term (current) use of other agents affecting estrogen receptors and estrogen levels: Secondary | ICD-10-CM | POA: Diagnosis not present

## 2024-09-24 MED ORDER — FULVESTRANT 250 MG/5ML IM SOSY
500.0000 mg | PREFILLED_SYRINGE | Freq: Once | INTRAMUSCULAR | Status: AC
  Administered 2024-09-24: 500 mg via INTRAMUSCULAR
  Filled 2024-09-24: qty 10

## 2024-10-22 ENCOUNTER — Inpatient Hospital Stay: Payer: Self-pay | Admitting: Hematology and Oncology

## 2024-10-22 ENCOUNTER — Inpatient Hospital Stay: Payer: Self-pay | Attending: Hematology and Oncology

## 2024-10-22 ENCOUNTER — Inpatient Hospital Stay: Payer: Self-pay

## 2024-10-22 VITALS — BP 129/74 | HR 76 | Temp 97.8°F | Resp 16

## 2024-10-22 DIAGNOSIS — C50212 Malignant neoplasm of upper-inner quadrant of left female breast: Secondary | ICD-10-CM

## 2024-10-22 LAB — CBC WITH DIFFERENTIAL (CANCER CENTER ONLY)
Abs Immature Granulocytes: 0.08 10*3/uL — ABNORMAL HIGH (ref 0.00–0.07)
Basophils Absolute: 0.1 10*3/uL (ref 0.0–0.1)
Basophils Relative: 1 %
Eosinophils Absolute: 0.2 10*3/uL (ref 0.0–0.5)
Eosinophils Relative: 3 %
HCT: 36.2 % (ref 36.0–46.0)
Hemoglobin: 12.1 g/dL (ref 12.0–15.0)
Immature Granulocytes: 1 %
Lymphocytes Relative: 15 %
Lymphs Abs: 1.2 10*3/uL (ref 0.7–4.0)
MCH: 31.6 pg (ref 26.0–34.0)
MCHC: 33.4 g/dL (ref 30.0–36.0)
MCV: 94.5 fL (ref 80.0–100.0)
Monocytes Absolute: 0.9 10*3/uL (ref 0.1–1.0)
Monocytes Relative: 10 %
Neutro Abs: 5.8 10*3/uL (ref 1.7–7.7)
Neutrophils Relative %: 70 %
Platelet Count: 214 10*3/uL (ref 150–400)
RBC: 3.83 MIL/uL — ABNORMAL LOW (ref 3.87–5.11)
RDW: 13.9 % (ref 11.5–15.5)
WBC Count: 8.2 10*3/uL (ref 4.0–10.5)
nRBC: 0 % (ref 0.0–0.2)

## 2024-10-22 LAB — CMP (CANCER CENTER ONLY)
ALT: 11 U/L (ref 0–44)
AST: 17 U/L (ref 15–41)
Albumin: 4.1 g/dL (ref 3.5–5.0)
Alkaline Phosphatase: 58 U/L (ref 38–126)
Anion gap: 12 (ref 5–15)
BUN: 22 mg/dL (ref 8–23)
CO2: 27 mmol/L (ref 22–32)
Calcium: 10.2 mg/dL (ref 8.9–10.3)
Chloride: 97 mmol/L — ABNORMAL LOW (ref 98–111)
Creatinine: 1.3 mg/dL — ABNORMAL HIGH (ref 0.44–1.00)
GFR, Estimated: 41 mL/min — ABNORMAL LOW
Glucose, Bld: 128 mg/dL — ABNORMAL HIGH (ref 70–99)
Potassium: 3.4 mmol/L — ABNORMAL LOW (ref 3.5–5.1)
Sodium: 136 mmol/L (ref 135–145)
Total Bilirubin: 0.5 mg/dL (ref 0.0–1.2)
Total Protein: 6.7 g/dL (ref 6.5–8.1)

## 2024-10-22 MED ORDER — FULVESTRANT 250 MG/5ML IM SOSY
500.0000 mg | PREFILLED_SYRINGE | Freq: Once | INTRAMUSCULAR | Status: AC
  Administered 2024-10-22: 500 mg via INTRAMUSCULAR
  Filled 2024-10-22: qty 10

## 2024-10-22 NOTE — Assessment & Plan Note (Signed)
 05/04/2016: Left breast UIQ T2 N0 grade 1 IDC ER/PR positive HER2 negative Ki-67 10% 05/09/2016: Biopsy second area UIQ left breast grade 2 IDC ER/PR positive HER2 negative Ki-67 15% 06/13/2016: Left lumpectomy: Grade 1 IDC T1c N0, bilateral reduction mammoplasty 08/02/2016: Left mastectomy: No evidence of malignancy November 2017: Oncotype DX score 18 (11% risk of distant recurrence) September 2017: Anastrozole  x 5 years completed November 2022 2023: Cutaneous diastases chest wall excision margin positive: Metastatic breast cancer ER 95%, PR 80%, HER2 2+ by IHC FISH negative 10/11/2023: PET/CT: No evidence of metastatic disease.  Fatty liver, gallstones   Current treatment: Faslodex  monthly Cutaneous metastasis: I recommend that we do a PET CT scan annually.  (Scheduled for 10/23/2024)  Return to clinic monthly for injections and every 6 months of follow-up with me.

## 2024-10-23 ENCOUNTER — Encounter (HOSPITAL_COMMUNITY): Admission: RE | Admit: 2024-10-23

## 2024-10-23 DIAGNOSIS — C50212 Malignant neoplasm of upper-inner quadrant of left female breast: Secondary | ICD-10-CM

## 2024-10-23 LAB — GLUCOSE, CAPILLARY: Glucose-Capillary: 114 mg/dL — ABNORMAL HIGH (ref 70–99)

## 2024-10-23 MED ORDER — FLUDEOXYGLUCOSE F - 18 (FDG) INJECTION
12.0000 | Freq: Once | INTRAVENOUS | Status: AC
  Administered 2024-10-23: 12.6 via INTRAVENOUS

## 2024-10-29 ENCOUNTER — Inpatient Hospital Stay: Admitting: Hematology and Oncology
